# Patient Record
Sex: Male | Born: 1940 | Race: White | Hispanic: No | Marital: Married | Smoking: Former smoker
Health system: Southern US, Community
[De-identification: ages and names within clinical notes are randomized; demographics above are authoritative.]

## PROBLEM LIST (undated history)

## (undated) DIAGNOSIS — R42 Dizziness and giddiness: Secondary | ICD-10-CM

## (undated) DIAGNOSIS — E785 Hyperlipidemia, unspecified: Secondary | ICD-10-CM

## (undated) DIAGNOSIS — N189 Chronic kidney disease, unspecified: Secondary | ICD-10-CM

## (undated) DIAGNOSIS — I2510 Atherosclerotic heart disease of native coronary artery without angina pectoris: Secondary | ICD-10-CM

## (undated) DIAGNOSIS — G629 Polyneuropathy, unspecified: Secondary | ICD-10-CM

## (undated) DIAGNOSIS — H538 Other visual disturbances: Secondary | ICD-10-CM

## (undated) DIAGNOSIS — I739 Peripheral vascular disease, unspecified: Secondary | ICD-10-CM

## (undated) DIAGNOSIS — I4510 Unspecified right bundle-branch block: Secondary | ICD-10-CM

## (undated) DIAGNOSIS — R011 Cardiac murmur, unspecified: Secondary | ICD-10-CM

## (undated) DIAGNOSIS — I70219 Atherosclerosis of native arteries of extremities with intermittent claudication, unspecified extremity: Secondary | ICD-10-CM

## (undated) DIAGNOSIS — E119 Type 2 diabetes mellitus without complications: Secondary | ICD-10-CM

## (undated) DIAGNOSIS — I70213 Atherosclerosis of native arteries of extremities with intermittent claudication, bilateral legs: Secondary | ICD-10-CM

## (undated) DIAGNOSIS — I96 Gangrene, not elsewhere classified: Secondary | ICD-10-CM

## (undated) DIAGNOSIS — Z7902 Long term (current) use of antithrombotics/antiplatelets: Secondary | ICD-10-CM

## (undated) DIAGNOSIS — I5032 Chronic diastolic (congestive) heart failure: Secondary | ICD-10-CM

## (undated) DIAGNOSIS — N183 Chronic kidney disease, stage 3 unspecified: Secondary | ICD-10-CM

## (undated) DIAGNOSIS — I10 Essential (primary) hypertension: Secondary | ICD-10-CM

## (undated) DIAGNOSIS — R079 Chest pain, unspecified: Secondary | ICD-10-CM

## (undated) DIAGNOSIS — Z972 Presence of dental prosthetic device (complete) (partial): Secondary | ICD-10-CM

## (undated) DIAGNOSIS — R002 Palpitations: Secondary | ICD-10-CM

## (undated) HISTORY — DX: Peripheral vascular disease, unspecified: I73.9

## (undated) HISTORY — PX: HERNIA REPAIR: SHX51

## (undated) HISTORY — DX: Chronic kidney disease, unspecified: N18.9

## (undated) HISTORY — PX: OTHER SURGICAL HISTORY: SHX169

## (undated) HISTORY — DX: Other visual disturbances: H53.8

## (undated) HISTORY — DX: Atherosclerotic heart disease of native coronary artery without angina pectoris: I25.10

---

## 2006-04-04 ENCOUNTER — Emergency Department: Payer: Self-pay | Admitting: Emergency Medicine

## 2018-02-17 DIAGNOSIS — E113513 Type 2 diabetes mellitus with proliferative diabetic retinopathy with macular edema, bilateral: Secondary | ICD-10-CM | POA: Diagnosis not present

## 2018-02-18 ENCOUNTER — Telehealth: Payer: Self-pay

## 2018-02-18 NOTE — Telephone Encounter (Signed)
Spoke with Orlinda BlalockMiel about getting this patient in sooner and she spoke with Dr Sherie DonLada and we could not find anything any earlier. We gave her [facility] numbers to [facility] to see if they could get him in any sooner.

## 2018-02-18 NOTE — Telephone Encounter (Signed)
Copied from CRM [phone]. Topic: Appointment Scheduling - Scheduling Inquiry for Clinic >> Feb 17, 2018  5:19 PM Jay SchlichterWeikart, Melissa J wrote: Reason for CRM: Dr Willey BladeBradley King from [facility] called . Pt has severe diabetic retinopathy and his glucose at eye appt today was 262.  Dr Brooke DareKing says this pt needs to be established as soon as possible for care for his diabetes and eyes.  Pt is scheduled for Sept to be established, but Dr is asking for him to be worked in sooner. (within couple of weeks)  Please call pt to let him know if he can get in.. Also please call Dr Brooke DareKing for any further info needed.  Cb is [phone]

## 2018-02-24 DIAGNOSIS — Z125 Encounter for screening for malignant neoplasm of prostate: Secondary | ICD-10-CM | POA: Diagnosis not present

## 2018-02-24 DIAGNOSIS — Z7689 Persons encountering health services in other specified circumstances: Secondary | ICD-10-CM | POA: Diagnosis not present

## 2018-02-24 DIAGNOSIS — Z0000 Encounter for general adult medical examination without abnormal findings: Secondary | ICD-10-CM | POA: Diagnosis not present

## 2018-02-24 DIAGNOSIS — I10 Essential (primary) hypertension: Secondary | ICD-10-CM | POA: Diagnosis not present

## 2018-02-25 DIAGNOSIS — Z125 Encounter for screening for malignant neoplasm of prostate: Secondary | ICD-10-CM | POA: Diagnosis not present

## 2018-02-25 DIAGNOSIS — Z0000 Encounter for general adult medical examination without abnormal findings: Secondary | ICD-10-CM | POA: Diagnosis not present

## 2018-02-25 DIAGNOSIS — E7800 Pure hypercholesterolemia, unspecified: Secondary | ICD-10-CM | POA: Diagnosis not present

## 2018-02-25 DIAGNOSIS — Z7689 Persons encountering health services in other specified circumstances: Secondary | ICD-10-CM | POA: Diagnosis not present

## 2018-02-25 DIAGNOSIS — R7309 Other abnormal glucose: Secondary | ICD-10-CM | POA: Diagnosis not present

## 2018-02-27 DIAGNOSIS — E113513 Type 2 diabetes mellitus with proliferative diabetic retinopathy with macular edema, bilateral: Secondary | ICD-10-CM | POA: Diagnosis not present

## 2018-03-24 ENCOUNTER — Encounter: Payer: Self-pay | Admitting: *Deleted

## 2018-03-24 ENCOUNTER — Inpatient Hospital Stay
Admission: EM | Admit: 2018-03-24 | Discharge: 2018-03-30 | DRG: 271 | Disposition: A | Discharge status: Home Health | Payer: Medicare Other | Attending: Internal Medicine | Admitting: Internal Medicine

## 2018-03-24 ENCOUNTER — Other Ambulatory Visit: Payer: Self-pay

## 2018-03-24 ENCOUNTER — Emergency Department: Payer: Medicare Other

## 2018-03-24 DIAGNOSIS — E11621 Type 2 diabetes mellitus with foot ulcer: Secondary | ICD-10-CM | POA: Diagnosis not present

## 2018-03-24 DIAGNOSIS — Z87891 Personal history of nicotine dependence: Secondary | ICD-10-CM

## 2018-03-24 DIAGNOSIS — M7989 Other specified soft tissue disorders: Secondary | ICD-10-CM | POA: Diagnosis not present

## 2018-03-24 DIAGNOSIS — B9689 Other specified bacterial agents as the cause of diseases classified elsewhere: Secondary | ICD-10-CM | POA: Diagnosis present

## 2018-03-24 DIAGNOSIS — N179 Acute kidney failure, unspecified: Secondary | ICD-10-CM | POA: Diagnosis not present

## 2018-03-24 DIAGNOSIS — Z887 Allergy status to serum and vaccine status: Secondary | ICD-10-CM

## 2018-03-24 DIAGNOSIS — E11628 Type 2 diabetes mellitus with other skin complications: Secondary | ICD-10-CM | POA: Diagnosis present

## 2018-03-24 DIAGNOSIS — R230 Cyanosis: Secondary | ICD-10-CM | POA: Diagnosis not present

## 2018-03-24 DIAGNOSIS — E1140 Type 2 diabetes mellitus with diabetic neuropathy, unspecified: Secondary | ICD-10-CM | POA: Diagnosis not present

## 2018-03-24 DIAGNOSIS — E871 Hypo-osmolality and hyponatremia: Secondary | ICD-10-CM | POA: Diagnosis present

## 2018-03-24 DIAGNOSIS — I70203 Unspecified atherosclerosis of native arteries of extremities, bilateral legs: Secondary | ICD-10-CM | POA: Diagnosis present

## 2018-03-24 DIAGNOSIS — Z833 Family history of diabetes mellitus: Secondary | ICD-10-CM | POA: Diagnosis not present

## 2018-03-24 DIAGNOSIS — Z7984 Long term (current) use of oral hypoglycemic drugs: Secondary | ICD-10-CM | POA: Diagnosis not present

## 2018-03-24 DIAGNOSIS — E1152 Type 2 diabetes mellitus with diabetic peripheral angiopathy with gangrene: Secondary | ICD-10-CM | POA: Diagnosis not present

## 2018-03-24 DIAGNOSIS — L97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 ulcer of other part of right foot with fat layer exposed: Secondary | ICD-10-CM | POA: Diagnosis not present

## 2018-03-24 DIAGNOSIS — L03116 Cellulitis of left lower limb: Secondary | ICD-10-CM | POA: Diagnosis present

## 2018-03-24 DIAGNOSIS — Z23 Encounter for immunization: Secondary | ICD-10-CM

## 2018-03-24 DIAGNOSIS — L02612 Cutaneous abscess of left foot: Secondary | ICD-10-CM | POA: Diagnosis present

## 2018-03-24 DIAGNOSIS — Z79899 Other long term (current) drug therapy: Secondary | ICD-10-CM | POA: Diagnosis not present

## 2018-03-24 DIAGNOSIS — L089 Local infection of the skin and subcutaneous tissue, unspecified: Secondary | ICD-10-CM | POA: Diagnosis present

## 2018-03-24 DIAGNOSIS — M79605 Pain in left leg: Secondary | ICD-10-CM | POA: Diagnosis not present

## 2018-03-24 DIAGNOSIS — E1142 Type 2 diabetes mellitus with diabetic polyneuropathy: Secondary | ICD-10-CM | POA: Diagnosis not present

## 2018-03-24 DIAGNOSIS — I70248 Atherosclerosis of native arteries of left leg with ulceration of other part of lower left leg: Secondary | ICD-10-CM | POA: Diagnosis not present

## 2018-03-24 DIAGNOSIS — L97529 Non-pressure chronic ulcer of other part of left foot with unspecified severity: Secondary | ICD-10-CM | POA: Diagnosis present

## 2018-03-24 DIAGNOSIS — M79672 Pain in left foot: Secondary | ICD-10-CM | POA: Diagnosis not present

## 2018-03-24 DIAGNOSIS — E119 Type 2 diabetes mellitus without complications: Secondary | ICD-10-CM | POA: Diagnosis not present

## 2018-03-24 DIAGNOSIS — N289 Disorder of kidney and ureter, unspecified: Secondary | ICD-10-CM | POA: Diagnosis not present

## 2018-03-24 DIAGNOSIS — I10 Essential (primary) hypertension: Secondary | ICD-10-CM | POA: Diagnosis present

## 2018-03-24 DIAGNOSIS — I739 Peripheral vascular disease, unspecified: Secondary | ICD-10-CM | POA: Diagnosis not present

## 2018-03-24 DIAGNOSIS — I96 Gangrene, not elsewhere classified: Secondary | ICD-10-CM | POA: Diagnosis not present

## 2018-03-24 HISTORY — DX: Type 2 diabetes mellitus without complications: E11.9

## 2018-03-24 LAB — CBC WITH DIFFERENTIAL/PLATELET
BASOS PCT: 1 %
Basophils Absolute: 0.2 10*3/uL — ABNORMAL HIGH (ref 0–0.1)
EOS ABS: 0.2 10*3/uL (ref 0–0.7)
Eosinophils Relative: 1 %
HCT: 36.1 % — ABNORMAL LOW (ref 40.0–52.0)
HEMOGLOBIN: 12.3 g/dL — AB (ref 13.0–18.0)
LYMPHS ABS: 2.3 10*3/uL (ref 1.0–3.6)
Lymphocytes Relative: 14 %
MCH: 30 pg (ref 26.0–34.0)
MCHC: 34.1 g/dL (ref 32.0–36.0)
MCV: 87.9 fL (ref 80.0–100.0)
Monocytes Absolute: 1.3 10*3/uL — ABNORMAL HIGH (ref 0.2–1.0)
Monocytes Relative: 8 %
NEUTROS PCT: 76 %
Neutro Abs: 11.7 10*3/uL — ABNORMAL HIGH (ref 1.4–6.5)
Platelets: 298 10*3/uL (ref 150–440)
RBC: 4.11 MIL/uL — AB (ref 4.40–5.90)
RDW: 13.2 % (ref 11.5–14.5)
WBC: 15.6 10*3/uL — AB (ref 3.8–10.6)

## 2018-03-24 LAB — COMPREHENSIVE METABOLIC PANEL
ALT: 19 U/L (ref 0–44)
AST: 22 U/L (ref 15–41)
Albumin: 3.9 g/dL (ref 3.5–5.0)
Alkaline Phosphatase: 55 U/L (ref 38–126)
Anion gap: 12 (ref 5–15)
BUN: 32 mg/dL — ABNORMAL HIGH (ref 8–23)
CO2: 23 mmol/L (ref 22–32)
Calcium: 9.3 mg/dL (ref 8.9–10.3)
Chloride: 97 mmol/L — ABNORMAL LOW (ref 98–111)
Creatinine, Ser: 1.34 mg/dL — ABNORMAL HIGH (ref 0.61–1.24)
GFR, EST AFRICAN AMERICAN: 57 mL/min — AB (ref >60)
GFR, EST NON AFRICAN AMERICAN: 49 mL/min — AB (ref >60)
Glucose, Bld: 165 mg/dL — ABNORMAL HIGH (ref 70–99)
Potassium: 4.1 mmol/L (ref 3.5–5.1)
Sodium: 132 mmol/L — ABNORMAL LOW (ref 135–145)
Total Bilirubin: 0.6 mg/dL (ref 0.3–1.2)
Total Protein: 8.4 g/dL — ABNORMAL HIGH (ref 6.5–8.1)

## 2018-03-24 LAB — LACTIC ACID, PLASMA: LACTIC ACID, VENOUS: 1.6 mmol/L (ref 0.5–1.9)

## 2018-03-24 LAB — GLUCOSE, CAPILLARY: Glucose-Capillary: 112 mg/dL — ABNORMAL HIGH (ref 70–99)

## 2018-03-24 LAB — SEDIMENTATION RATE: Sed Rate: 95 mm/hr — ABNORMAL HIGH (ref 0–20)

## 2018-03-24 MED ORDER — PNEUMOCOCCAL VAC POLYVALENT 25 MCG/0.5ML IJ INJ
0.5000 mL | INJECTION | INTRAMUSCULAR | Status: DC
  Filled 2018-03-24: qty 0.5

## 2018-03-24 MED ORDER — HYDRALAZINE HCL 25 MG PO TABS
25.0000 mg | ORAL_TABLET | Freq: Three times a day (TID) | ORAL | Status: DC
  Administered 2018-03-24 – 2018-03-30 (×15): 25 mg via ORAL
  Filled 2018-03-24 (×15): qty 1

## 2018-03-24 MED ORDER — SODIUM CHLORIDE 0.9 % IV BOLUS
1000.0000 mL | Freq: Once | INTRAVENOUS | Status: AC
  Administered 2018-03-24: 1000 mL via INTRAVENOUS

## 2018-03-24 MED ORDER — PIPERACILLIN-TAZOBACTAM 3.375 G IVPB
3.3750 g | Freq: Three times a day (TID) | INTRAVENOUS | Status: DC
  Administered 2018-03-24 – 2018-03-28 (×10): 3.375 g via INTRAVENOUS
  Filled 2018-03-24 (×10): qty 50

## 2018-03-24 MED ORDER — INSULIN ASPART 100 UNIT/ML ~~LOC~~ SOLN: 0.0000 [IU] | Freq: Every day | SUBCUTANEOUS | Status: DC

## 2018-03-24 MED ORDER — INSULIN ASPART 100 UNIT/ML ~~LOC~~ SOLN
3.0000 [IU] | Freq: Three times a day (TID) | SUBCUTANEOUS | Status: DC
  Administered 2018-03-25 – 2018-03-30 (×8): 3 [IU] via SUBCUTANEOUS
  Filled 2018-03-24 (×8): qty 1

## 2018-03-24 MED ORDER — AMLODIPINE BESYLATE 5 MG PO TABS
2.5000 mg | ORAL_TABLET | Freq: Every day | ORAL | Status: DC
  Administered 2018-03-25 – 2018-03-30 (×6): 2.5 mg via ORAL
  Filled 2018-03-24 (×6): qty 1

## 2018-03-24 MED ORDER — INSULIN ASPART 100 UNIT/ML ~~LOC~~ SOLN
0.0000 [IU] | Freq: Three times a day (TID) | SUBCUTANEOUS | Status: DC
  Administered 2018-03-25: 1 [IU] via SUBCUTANEOUS
  Administered 2018-03-25: 2 [IU] via SUBCUTANEOUS
  Administered 2018-03-27: 1 [IU] via SUBCUTANEOUS
  Administered 2018-03-27: 3 [IU] via SUBCUTANEOUS
  Administered 2018-03-28 – 2018-03-30 (×4): 2 [IU] via SUBCUTANEOUS
  Filled 2018-03-24 (×8): qty 1

## 2018-03-24 MED ORDER — ACETAMINOPHEN 325 MG PO TABS
650.0000 mg | ORAL_TABLET | Freq: Four times a day (QID) | ORAL | Status: DC | PRN
  Administered 2018-03-24 – 2018-03-30 (×7): 650 mg via ORAL
  Filled 2018-03-24 (×7): qty 2

## 2018-03-24 MED ORDER — TRAMADOL HCL 50 MG PO TABS
50.0000 mg | ORAL_TABLET | Freq: Three times a day (TID) | ORAL | Status: DC | PRN
  Administered 2018-03-25: 50 mg via ORAL
  Filled 2018-03-24: qty 1

## 2018-03-24 MED ORDER — VANCOMYCIN HCL IN DEXTROSE 1-5 GM/200ML-% IV SOLN: 1000.0000 mg | Freq: Once | INTRAVENOUS | Status: DC

## 2018-03-24 MED ORDER — HEPARIN SODIUM (PORCINE) 5000 UNIT/ML IJ SOLN
5000.0000 [IU] | Freq: Three times a day (TID) | INTRAMUSCULAR | Status: DC
  Administered 2018-03-24 – 2018-03-30 (×14): 5000 [IU] via SUBCUTANEOUS
  Filled 2018-03-24 (×14): qty 1

## 2018-03-24 MED ORDER — PIPERACILLIN-TAZOBACTAM 3.375 G IVPB 30 MIN: 3.3750 g | Freq: Once | INTRAVENOUS | Status: DC

## 2018-03-24 MED ORDER — VANCOMYCIN HCL 10 G IV SOLR
1750.0000 mg | Freq: Once | INTRAVENOUS | Status: AC
  Administered 2018-03-24: 1750 mg via INTRAVENOUS
  Filled 2018-03-24: qty 1750

## 2018-03-24 MED ORDER — VANCOMYCIN HCL 10 G IV SOLR
1250.0000 mg | INTRAVENOUS | Status: DC
  Administered 2018-03-25 – 2018-03-27 (×3): 1250 mg via INTRAVENOUS
  Filled 2018-03-24 (×3): qty 1250

## 2018-03-24 NOTE — H&P (Signed)
[facility] [place] [facility]   PATIENT NAME: [name]    MR#:  [id number]  DATE OF BIRTH:  Aug 03, 1941  DATE OF ADMISSION:  03/24/2018  PRIMARY CARE PHYSICIAN: Wilford Corner, PA-C   REQUESTING/REFERRING PHYSICIAN: Rockne Menghini, MD  CHIEF COMPLAINT:   Chief Complaint  Patient presents with  . Foot Pain    HISTORY OF PRESENT ILLNESS:  [name]  is a 77 y.o. male who was recently diagnosed with diabetes in June 2019, presenting with left foot erythema, drainage, warmth and pain. The patient reports that for the past two weeks, he has had pain around the second toe with surrounding progressively worsening erythema to the midfoot with associated warmth.  His second toe is now discolored purple.  He has also noted some purulent discharge between the second and third toe.  He does not remember an injury.  He has been taking his medications as prescribed.  He has had subjective fevers at home with decreased appetite.  PAST MEDICAL HISTORY:   Past Medical History:  Diagnosis Date  . Diabetes mellitus without complication (HCC)     PAST SURGICAL HISTORY:   Past Surgical History:  Procedure Laterality Date  . HERNIA REPAIR      SOCIAL HISTORY:   Social History   Tobacco Use  . Smoking status: Former Smoker    Types: Cigarettes  . Smokeless tobacco: Never Used  Substance Use Topics  . Alcohol use: Never    Frequency: Never    FAMILY HISTORY:  History reviewed. No pertinent family history. Mother with diabetes DRUG ALLERGIES:   Allergies  Allergen Reactions  . Flu Virus Vaccine Other (See Comments) and Nausea And Vomiting    Fever, chills, vomiting.     REVIEW OF SYSTEMS:   Review of Systems  Constitutional: Negative for chills, fever and weight loss.  HENT: Negative for nosebleeds and sore throat.   Eyes: Negative for blurred vision.  Respiratory: Negative for cough, shortness of breath and wheezing.     Cardiovascular: Negative for chest pain, orthopnea, leg swelling and PND.  Gastrointestinal: Negative for abdominal pain, constipation, diarrhea, heartburn, nausea and vomiting.  Genitourinary: Negative for dysuria and urgency.  Musculoskeletal: Positive for joint pain. Negative for back pain.  Skin: Positive for rash.  Neurological: Negative for dizziness, speech change, focal weakness and headaches.  Endo/Heme/Allergies: Does not bruise/bleed easily.  Psychiatric/Behavioral: Negative for depression.    MEDICATIONS AT HOME:   Prior to Admission medications   Medication Sig Start Date End Date Taking? Authorizing Provider  amLODipine (NORVASC) 2.5 MG tablet Take 2.5 mg by mouth daily. 03/14/18  Yes [provider]  lisinopril (PRINIVIL,ZESTRIL) 5 MG tablet Take 5 mg by mouth daily. 02/25/18  Yes [provider]  metFORMIN (GLUCOPHAGE) 500 MG tablet Take 1,000 mg by mouth 2 (two) times daily. 02/25/18  Yes [provider]      VITAL SIGNS:  Blood pressure (!) 170/71, pulse 92, temperature 99.7 F (37.6 C), resp. rate 18, height 5\' 9"  (1.753 m), weight 79.4 kg (175 lb), SpO2 99 %.  PHYSICAL EXAMINATION:  Physical Exam  GENERAL:  77 y.o.-year-old patient lying in the bed with no acute distress.  EYES: Pupils equal, round, reactive to light and accommodation. No scleral icterus. Extraocular muscles intact.  HEENT: Head atraumatic, normocephalic. Oropharynx and nasopharynx clear.  NECK:  Supple, no jugular venous distention. No thyroid enlargement, no tenderness.  LUNGS: Normal breath sounds bilaterally, no wheezing, rales,rhonchi or crepitation.  No use of accessory muscles of respiration.  CARDIOVASCULAR: S1, S2 normal. No murmurs, rubs, or gallops.  ABDOMEN: Soft, nontender, nondistended. Bowel sounds present. No organomegaly or mass.  EXTREMITIES: No pedal edema, cyanosis, or clubbing.  Left foot exam as below NEUROLOGIC: Cranial nerves II through XII are  intact. Muscle strength 5/5 in all extremities. Sensation intact. Gait not checked.  PSYCHIATRIC: The patient is alert and oriented x 3.  SKIN: patient's left foot has purple discoloration with swelling and purulent discharge around the second toe without any obvious open skin or fluctuance.  He has surrounding erythema in all of the toes to the proximal foot with swelling and warmth.   LABORATORY PANEL:   CBC Recent Labs  Lab 03/24/18 1651  WBC 15.6*  HGB 12.3*  HCT 36.1*  PLT 298   ------------------------------------------------------------------------------------------------------------------  Chemistries  Recent Labs  Lab 03/24/18 1651  NA 132*  K 4.1  CL 97*  CO2 23  GLUCOSE 165*  BUN 32*  CREATININE 1.34*  CALCIUM 9.3  AST 22  ALT 19  ALKPHOS 55  BILITOT 0.6   ------------------------------------------------------------------------------------------------------------------  Cardiac Enzymes No results for input(s): TROPONINI in the last 168 hours. ------------------------------------------------------------------------------------------------------------------  RADIOLOGY:  Dg Foot Complete Left  Result Date: 03/24/2018 CLINICAL DATA:  Suspected gangrene in a diabetic patient. Foot swelling. EXAM: LEFT FOOT - COMPLETE 3+ VIEW COMPARISON:  None. FINDINGS: There is no evidence of fracture or dislocation. There is no evidence of arthropathy or other focal bone abnormality. Mild diffuse soft tissue swelling. No air is seen in the soft tissues. There are plantar and Achilles spurs. IMPRESSION: No plain film findings of osteomyelitis, nor air in the soft tissues. Electronically Signed   By: Elsie StainJohn T Curnes M.D.   On: 03/24/2018 17:17   IMPRESSION AND PLAN:  77 year old male with diabetic foot infection  *Diabetic foot infection -Start IV vancomycin and Zosyn for now -Consult podiatry -X-ray does not show any osteomyelitis  *Diabetes mellitus -Start sliding scale  insulin, check hemoglobin A1c. hold metformin -Consult diabetic nurse  *Acute renal failure - ATN versus prerenal -Hold metformin and lisinopril -Hydrate with IV fluids and monitor renal function  *Hypertension Continue Norvasc, add hydralazine for better blood pressure control    All the records are reviewed and case discussed with ED provider. Management plans discussed with the patient, family (wife at bedside) and they are in agreement.  CODE STATUS: FULL CODE  TOTAL TIME TAKING CARE OF THIS PATIENT: 45 minutes.    Delfino LovettVipul Ka Flammer M.D on 03/24/2018 at 8:37 PM  Between 7am to 6pm - Pager - [phone]  After 6pm go to [email or web address] - Scientist, research (life sciences)password EPAS [facility]  [facility]  Office  [phone]  CC: Primary care physician; Whitaker, Jonnie FinnerJason Hestle, PA-C   Note: This dictation was prepared with Dragon dictation along with smaller phrase technology. Any transcriptional errors that result from this process are unintentional.

## 2018-03-24 NOTE — ED Notes (Signed)
Pt given urinal for urine specimen collection 

## 2018-03-24 NOTE — ED Notes (Signed)
First set of blood cultures drawn in triage.

## 2018-03-24 NOTE — ED Provider Notes (Signed)
[facility] Emergency Department Provider Note  ____________________________________________  Time seen: Approximately 7:55 PM  I have reviewed the triage vital signs and the nursing notes.   HISTORY  Chief Complaint Foot Pain    HPI [name] is a 77 y.o. male, recently diagnosed with diabetes, presenting with left foot erythema, drainage, warmth and pain. The patient reports that for the past two weeks, he has had pain around the second toe with surrounding progressively worsening erythema to the midfoot with associated warmth.  His second toe is now discolored purple.  He has also noted some purulent discharge between the second and third toe.  He does not remember an injury.  He has been taking his medications as prescribed.  He has had subjective fevers at home with decreased appetite.  No nausea vomiting or diarrhea.   Past Medical History:  Diagnosis Date  . Diabetes mellitus without complication (HCC)     There are no active problems to display for this patient.   Past Surgical History:  Procedure Laterality Date  . HERNIA REPAIR      Current Outpatient Rx  . Order #: [id number] Class: Historical Med  . Order #: [id number] Class: Historical Med  . Order #: [id number] Class: Historical Med    Allergies Flu virus vaccine  History reviewed. No pertinent family history.  Social History Social History   Tobacco Use  . Smoking status: Former Smoker    Types: Cigarettes  . Smokeless tobacco: Never Used  Substance Use Topics  . Alcohol use: Never    Frequency: Never  . Drug use: Never    Review of Systems Constitutional: No fever/chills.  No lightheadedness or syncope. Eyes: No visual changes. ENT:No congestion or rhinorrhea. Cardiovascular: Denies chest pain. Denies palpitations. Respiratory: Denies shortness of breath.  No cough. Gastrointestinal: No abdominal pain.  No nausea, no vomiting.  No diarrhea.  No  constipation. Genitourinary: Negative for dysuria. Musculoskeletal: Negative for back pain.  Positive for left foot erythema, swelling and warmth with pain and purulent discharge.. Skin: Negative for rash. Neurological: Negative for headaches. No focal numbness, tingling or weakness.  Endocrine:Newly diagnosed diabetic.   ____________________________________________   PHYSICAL EXAM:  VITAL SIGNS: ED Triage Vitals  Enc Vitals Group     BP 03/24/18 1554 (!) 152/74     Pulse Rate 03/24/18 1554 89     Resp 03/24/18 1554 20     Temp 03/24/18 1554 99.9 F (37.7 C)     Temp Source 03/24/18 1554 Oral     SpO2 03/24/18 1554 97 %     Weight 03/24/18 1631 175 lb (79.4 kg)     Height 03/24/18 1631 5\' 9"  (1.753 m)     Head Circumference --      Peak Flow --      Pain Score 03/24/18 1631 4     Pain Loc --      Pain Edu? --      Excl. in GC? --     Constitutional: Alert and oriented. Answers questions appropriately.  Uncomfortable appearing but nontoxic. Eyes: Conjunctivae are normal.  EOMI. No scleral icterus. Head: Atraumatic. Nose: No congestion/rhinnorhea. Mouth/Throat: Mucous membranes are moist.  Neck: No stridor.  Supple.  No JVD.  No meningismus. Cardiovascular: Normal rate, regular rhythm. No murmurs, rubs or gallops.  Respiratory: Normal respiratory effort.  No accessory muscle use or retractions. Lungs CTAB.  No wheezes, rales or ronchi. Gastrointestinal: Soft, nontender and nondistended.  No guarding or rebound.  No peritoneal  signs. Musculoskeletal: The patient's left foot has purple discoloration with swelling and purulent discharge around the second toe without any obvious open skin or fluctuance.  He has surrounding erythema in all of the toes to the proximal foot with swelling and warmth.  He has normal DP and PT pulses bilaterally. Neurologic:  A&Ox3.  Speech is clear.  Face and smile are symmetric.  EOMI.  Moves all extremities well. Skin:  Skin is warm, dry and  intact. No rash noted. Psychiatric: Mood and affect are normal. Speech and behavior are normal.  Normal judgement.  ____________________________________________   LABS (all labs ordered are listed, but only abnormal results are displayed)  Labs Reviewed  COMPREHENSIVE METABOLIC PANEL - Abnormal; Notable for the following components:      Result Value   Sodium 132 (*)    Chloride 97 (*)    Glucose, Bld 165 (*)    BUN 32 (*)    Creatinine, Ser 1.34 (*)    Total Protein 8.4 (*)    GFR calc non Af Amer 49 (*)    GFR calc Af Amer 57 (*)    All other components within normal limits  CBC WITH DIFFERENTIAL/PLATELET - Abnormal; Notable for the following components:   WBC 15.6 (*)    RBC 4.11 (*)    Hemoglobin 12.3 (*)    HCT 36.1 (*)    Neutro Abs 11.7 (*)    Monocytes Absolute 1.3 (*)    Basophils Absolute 0.2 (*)    All other components within normal limits  LACTIC ACID, PLASMA  SEDIMENTATION RATE   ____________________________________________  EKG  Not indicated ____________________________________________  RADIOLOGY  Dg Foot Complete Left  Result Date: 03/24/2018 CLINICAL DATA:  Suspected gangrene in a diabetic patient. Foot swelling. EXAM: LEFT FOOT - COMPLETE 3+ VIEW COMPARISON:  None. FINDINGS: There is no evidence of fracture or dislocation. There is no evidence of arthropathy or other focal bone abnormality. Mild diffuse soft tissue swelling. No air is seen in the soft tissues. There are plantar and Achilles spurs. IMPRESSION: No plain film findings of osteomyelitis, nor air in the soft tissues. Electronically Signed   By: Elsie StainJohn T Curnes M.D.   On: 03/24/2018 17:17    ____________________________________________   PROCEDURES  Procedure(s) performed: None  Procedures  Critical Care performed: No ____________________________________________   INITIAL IMPRESSION / ASSESSMENT AND PLAN / ED COURSE  Pertinent labs & imaging results that were available during my  care of the patient were reviewed by me and considered in my medical decision making (see chart for details).  77 y.o. male w/ DM presenting with left foot erythema, warmth, swelling, purplish discoloration of the second toe with purulent discharge.  The patient has been having subjective fevers at home although he is afebrile here.  Overall, the patient's symptoms are most consistent with a diabetic foot infection and I have ordered vancomycin for the patient.  The patient has been having fevers and decreased appetite, so other worsening signs of infection are possible including osteomyelitis, bacteremia.  Plan admission.  ----------------------------------------- 8:10 PM on 03/24/2018 -----------------------------------------  The patient's left foot x-ray does not show any evidence of gas.  In addition, his lactic acid is reassuring.  ____________________________________________  FINAL CLINICAL IMPRESSION(S) / ED DIAGNOSES  Final diagnoses:  Diabetic foot infection (HCC)  Hyponatremia  Acute renal insufficiency         NEW MEDICATIONS STARTED DURING THIS VISIT:  New Prescriptions   No medications on file  Rockne Menghini, MD 03/24/18 2010

## 2018-03-24 NOTE — ED Notes (Signed)
Pt able to move all toes. Pt has redness and warmth noted to left foot. Pt states he recently found out he is diabetic. Pt's 2nd toe is purple discoloration and has what looks like infection surrounding the lower toe. Pt's left foot also looks to have some possible blistering coming.

## 2018-03-24 NOTE — ED Triage Notes (Signed)
Pt to ED after PCP sent him for R/O gang green in his left foot. Pt has redness noted to the toes and half of his left foot along with greenish grey discharge from a blistered area around his second toe and the underside of his great toe. Pt is able to move his toes with reports of decreased sensation but has decreased sensation on baseline. Intermittent fevers at home.

## 2018-03-24 NOTE — Consult Note (Signed)
Pharmacy Antibiotic Note  Charles BrochureHoward D [name] is a 77 y.o. male admitted on 03/24/2018 with Diabetic Foot Infection .  Pharmacy has been consulted for Vancomycin and Zosyn  Dosing. Patient received vancomycin 1750mg  IV x 1 dose in ED.    Plan: Ke: 0.043   T1/2: 16.2    Vd: 55.58  Start Vancomycin 1250 IV every 24 hours with 15 hour stack dosing.  Goal trough 15-20 mcg/mL. Calculated trough at Css is 15.6. Trough level prior to 4th dose.   Start Zosyn 3.375 IV EI every 8 hours.   Height: 5\' 9"  (175.3 cm) Weight: 175 lb (79.4 kg) IBW/kg (Calculated) : 70.7  Temp (24hrs), Avg:99.8 F (37.7 C), Min:99.7 F (37.6 C), Max:99.9 F (37.7 C)  Recent Labs  Lab 03/24/18 1651  WBC 15.6*  CREATININE 1.34*  LATICACIDVEN 1.6    Estimated Creatinine Clearance: 46.2 mL/min (A) (by C-G formula based on SCr of 1.34 mg/dL (H)).    Allergies  Allergen Reactions  . Flu Virus Vaccine Other (See Comments) and Nausea And Vomiting    Fever, chills, vomiting.     Antimicrobials this admission: 7/8 Zosyn  >>  7/8 vancomycin >>   Dose adjustments this admission:  Microbiology results: 7/8 BCx: pending  Thank you for allowing pharmacy to be a part of this patient's care.  Gardner CandleSheema M Norine Reddington, PharmD, BCPS Clinical Pharmacist 03/24/2018 8:56 PM

## 2018-03-24 NOTE — ED Notes (Signed)
First Nurse Note:  Patient presents to the ED from PCP.  Per PCP patient's 2nd toe on his left foot appears to be "gangrenous".  Patient has severe peripheral neuropathy.  Patient has redness to left leg and a low grade fever.

## 2018-03-25 DIAGNOSIS — M79605 Pain in left leg: Secondary | ICD-10-CM

## 2018-03-25 DIAGNOSIS — I10 Essential (primary) hypertension: Secondary | ICD-10-CM

## 2018-03-25 DIAGNOSIS — E11621 Type 2 diabetes mellitus with foot ulcer: Secondary | ICD-10-CM

## 2018-03-25 LAB — GLUCOSE, CAPILLARY
GLUCOSE-CAPILLARY: 103 mg/dL — AB (ref 70–99)
GLUCOSE-CAPILLARY: 172 mg/dL — AB (ref 70–99)
GLUCOSE-CAPILLARY: 143 mg/dL — AB (ref 70–99)
GLUCOSE-CAPILLARY: 103 mg/dL — AB (ref 70–99)

## 2018-03-25 LAB — HEMOGLOBIN A1C
Hgb A1c MFr Bld: 7.2 % — ABNORMAL HIGH (ref 4.8–5.6)
Mean Plasma Glucose: 159.94 mg/dL

## 2018-03-25 MED ORDER — TETANUS-DIPHTH-ACELL PERTUSSIS 5-2.5-18.5 LF-MCG/0.5 IM SUSP
0.5000 mL | Freq: Once | INTRAMUSCULAR | Status: AC
Start: 2018-03-25 — End: 2018-03-25
  Administered 2018-03-25: 0.5 mL via INTRAMUSCULAR
  Filled 2018-03-25: qty 0.5

## 2018-03-25 NOTE — Progress Notes (Signed)
Inpatient Diabetes Program Recommendations  AACE/ADA: New Consensus Statement on Inpatient Glycemic Control (2015)  Target Ranges:  Prepandial:   less than 140 mg/dL      Peak postprandial:   less than 180 mg/dL (1-2 hours)      Critically ill patients:  140 - 180 mg/dL   Lab Results  Component Value Date   GLUCAP 172 (H) 03/25/2018   HGBA1C 7.2 (H) 03/24/2018    Review of Glycemic ControlResults for Charles BrochureMICHAEL, [name] (MRN [id number]) as of 03/25/2018 14:03  Ref. Range 03/24/2018 21:55 03/25/2018 07:47 03/25/2018 11:48  Glucose-Capillary Latest Ref Range: 70 - 99 mg/dL 735112 (H) 329103 (H) 924172 (H)    Diabetes history: Type 2 Outpatient Diabetes medications: Metformin 1000 mg bid Current orders for Inpatient glycemic control:  Novolog sensitive tid with meals and HS, Novolog 3 units tid with meals Inpatient Diabetes Program Recommendations:   Agree with current orders.  A1C currently within goal.   Thanks,  Beryl MeagerJenny Hikaru Delorenzo, RN, BC-ADM Inpatient Diabetes Coordinator Pager [phone] (8a-5p)

## 2018-03-25 NOTE — Consult Note (Signed)
[facility] Vascular Consult Note  MRN : [id number]  [name] is a 77 y.o. (24-Apr-1941) male who presents with chief complaint of  Chief Complaint  Patient presents with  . Foot Pain  .  History of Present Illness:   I have asked to evaluate the patient by Dr. Alberteen Spindleline for peripheral artery disease in association with left foot ulceration, diabetic foot infection.  Patient is a 77 year old gentleman who was recently informed that he is a diabetic as well as hypertensive person.  He readily states that he has not been seeking any medical attention for decades.  He presented to [facility] yesterday with left foot pain drainage and increasing redness.  Patient notes the symptoms began approximately 2 weeks ago.  They have been steadily worsening with time.  He states that his foot is normally quite numb and he does not feel anything but since the onset of the symptoms he has now experiencing intense pain area he also has been noting drainage from the second and third toe.  He denies any specific trauma.  He states he has been feeling feverish at home but has not taken his temperature.  He denies chills or Reiger's.  He denies nausea vomiting.  He does state that he has not been hungry and has decreased appetite.  Patient states this is the first time he has had any infections of the left foot.  He does relate that in the remote past he stepped on a nail and had to have treatment of his right foot.  He denies claudication.  He denies prior vascular evaluation, angiography/intervention or other invasive vascular surgeries.  Current Facility-Administered Medications  Medication Dose Route Frequency Provider Last Rate Last Dose  . acetaminophen (TYLENOL) tablet 650 mg  650 mg Oral Q6H PRN Charles CopaMaier, Angela, MD   650 mg at 03/24/18 2220  . amLODipine (NORVASC) tablet 2.5 mg  2.5 mg Oral Daily Sherryll BurgerShah, Vipul, MD   2.5 mg at 03/25/18 1035  . heparin  injection 5,000 Units  5,000 Units Subcutaneous Q8H Delfino LovettShah, Vipul, MD   5,000 Units at 03/25/18 2024  . hydrALAZINE (APRESOLINE) tablet 25 mg  25 mg Oral Q8H Delfino LovettShah, Vipul, MD   25 mg at 03/25/18 2024  . insulin aspart (novoLOG) injection 0-5 Units  0-5 Units Subcutaneous QHS Sherryll BurgerShah, Vipul, MD      . insulin aspart (novoLOG) injection 0-9 Units  0-9 Units Subcutaneous TID WC Delfino LovettShah, Vipul, MD   1 Units at 03/25/18 1652  . insulin aspart (novoLOG) injection 3 Units  3 Units Subcutaneous TID WC Delfino LovettShah, Vipul, MD   3 Units at 03/25/18 1653  . piperacillin-tazobactam (ZOSYN) IVPB 3.375 g  3.375 g Intravenous Q8H Hallaji, Sheema M, RPH 12.5 mL/hr at 03/25/18 2024 3.375 g at 03/25/18 2024  . pneumococcal 23 valent vaccine (PNU-IMMUNE) injection 0.5 mL  0.5 mL Intramuscular Tomorrow-1000 Sherryll BurgerShah, Vipul, MD      . traMADol Janean Sark(ULTRAM) tablet 50 mg  50 mg Oral TID PRN Charles CopaMaier, Angela, MD   50 mg at 03/25/18 1621  . vancomycin (VANCOCIN) 1,250 mg in sodium chloride 0.9 % 250 mL IVPB  1,250 mg Intravenous Q24H Gardner CandleHallaji, Sheema M, RPH   Stopped at 03/25/18 1331    Past Medical History:  Diagnosis Date  . Diabetes mellitus without complication [facility](HCC)     Past Surgical History:  Procedure Laterality Date  . HERNIA REPAIR      Social History Social History   Tobacco  Use  . Smoking status: Former Smoker    Types: Cigarettes  . Smokeless tobacco: Never Used  Substance Use Topics  . Alcohol use: Never    Frequency: Never  . Drug use: Never    Family History History reviewed. No pertinent family history. No family history of bleeding/clotting disorders, porphyria or autoimmune disease   Allergies  Allergen Reactions  . Flu Virus Vaccine Other (See Comments) and Nausea And Vomiting    Fever, chills, vomiting.      REVIEW OF SYSTEMS (Negative unless checked)  Constitutional: [] Weight loss  [] Fever  [] Chills Cardiac: [] Chest pain   [] Chest pressure   [] Palpitations   [] Shortness of breath when laying flat    [] Shortness of breath at rest   [] Shortness of breath with exertion. Vascular:  [] Pain in legs with walking   [] Pain in legs at rest   [] Pain in legs when laying flat   [] Claudication   [] Pain in feet when walking  [x] Pain in feet at rest  [x] Pain in feet when laying flat   [] History of DVT   [] Phlebitis   [x] Swelling in legs   [] Varicose veins   [] Non-healing ulcers Pulmonary:   [] Uses home oxygen   [] Productive cough   [] Hemoptysis   [] Wheeze  [] COPD   [] Asthma Neurologic:  [] Dizziness  [] Blackouts   [] Seizures   [] History of stroke   [] History of TIA  [] Aphasia   [] Temporary blindness   [] Dysphagia   [] Weakness or numbness in arms   [] Weakness or numbness in legs Musculoskeletal:  [] Arthritis   [x] Joint swelling   [] Joint pain   [] Low back pain Hematologic:  [] Easy bruising  [] Easy bleeding   [] Hypercoagulable state   [] Anemic  [] Hepatitis Gastrointestinal:  [] Blood in stool   [] Vomiting blood  [] Gastroesophageal reflux/heartburn   [] Difficulty swallowing. Genitourinary:  [] Chronic kidney disease   [] Difficult urination  [] Frequent urination  [] Burning with urination   [] Blood in urine Skin:  [] Rashes   [x] Ulcers   [x] Wounds Psychological:  [] History of anxiety   []  History of major depression.  Physical Examination  Vitals:   03/25/18 1035 03/25/18 1426 03/25/18 1625 03/25/18 1630  BP: (!) 144/62 (!) 178/75 (!) 175/71 (!) 177/71  Pulse:   76 75  Resp:   17   Temp:   98.3 F (36.8 C) 99.4 F (37.4 C)  TempSrc:   Oral Oral  SpO2:   97% 98%  Weight:      Height:       Body mass index is 25.84 kg/m. Gen:  WD/WN, NAD Head: [place]/AT, No temporalis wasting. Prominent temp pulse not noted. Ear/Nose/Throat: Hearing grossly intact, nares w/o erythema or drainage, oropharynx w/o Erythema/Exudate Eyes: Sclera non-icteric, conjunctiva clear Neck: Trachea midline.  No JVD.  Pulmonary:  Good air movement, respirations not labored, equal bilaterally.  Cardiac: RRR, normal S1, S2. Vascular:  Erythema of the forefoot great toe is cool to the touch with 1-2 second capillary refill but it is pink at this point Vessel Right Left  Radial Palpable Palpable  Popliteal  enlarged palpable  enlarged palpable  PT  not palpable  not palpable  DP  not palpable  not palpable  Gastrointestinal: soft, non-tender/non-distended. No guarding/reflex.  Musculoskeletal: M/S 5/5 throughout.  Extremities without ischemic changes.  No deformity or atrophy. No edema. Neurologic: Sensation grossly intact in extremities.  Symmetrical.  Speech is fluent. Motor exam as listed above. Psychiatric: Judgment intact, Mood & affect appropriate for pt's clinical situation. Dermatologic: No rashes positive left foot  ulcer base of the second and third toe noted.  Positive cellulitis positive open wounds. Lymph : No Cervical, Axillary, or Inguinal lymphadenopathy.      CBC Lab Results  Component Value Date   WBC 15.6 (H) 03/24/2018   HGB 12.3 (L) 03/24/2018   HCT 36.1 (L) 03/24/2018   MCV 87.9 03/24/2018   PLT 298 03/24/2018    BMET    Component Value Date/Time   NA 132 (L) 03/24/2018 1651   K 4.1 03/24/2018 1651   CL 97 (L) 03/24/2018 1651   CO2 23 03/24/2018 1651   GLUCOSE 165 (H) 03/24/2018 1651   BUN 32 (H) 03/24/2018 1651   CREATININE 1.34 (H) 03/24/2018 1651   CALCIUM 9.3 03/24/2018 1651   GFRNONAA 49 (L) 03/24/2018 1651   GFRAA 57 (L) 03/24/2018 1651   Estimated Creatinine Clearance: 46.2 mL/min (A) (by C-G formula based on SCr of 1.34 mg/dL (H)).  COAG No results found for: INR, PROTIME  Radiology Dg Foot Complete Left  Result Date: 03/24/2018 CLINICAL DATA:  Suspected gangrene in a diabetic patient. Foot swelling. EXAM: LEFT FOOT - COMPLETE 3+ VIEW COMPARISON:  None. FINDINGS: There is no evidence of fracture or dislocation. There is no evidence of arthropathy or other focal bone abnormality. Mild diffuse soft tissue swelling. No air is seen in the soft tissues. There are plantar and  Achilles spurs. IMPRESSION: No plain film findings of osteomyelitis, nor air in the soft tissues. Electronically Signed   By: Elsie Stain M.D.   On: 03/24/2018 17:17      Assessment/Plan 1.  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  This represents a left limb threatening ischemia and places the patient at the risk for limb loss.  Patient should undergo angiography of the left lower extremities with the hope for intervention for limb salvage.  The risks and benefits as well as the alternative therapies was discussed in detail with the patient.  All questions were answered.  Patient agrees to proceed with angiography.  The patient will follow up with me in the office after the procedure.   2.  Diabetic foot infection: Patient is on appropriate antibiotics.  He has been evaluated by Dr. Alberteen Spindle.  At this time he will undergo revascularization after which further assessment as to whether he needs surgical drainage and debridement.  3.  Diabetes mellitus: Continue hypoglycemic medications as already ordered, these medications have been reviewed and there are no changes at this time.  Hgb A1C to be monitored as already arranged by primary service  4.  Hypertension: Continue antihypertensive medications as already ordered, these medications have been reviewed and there are no changes at this time.     [name], MD  03/25/2018 9:45 PM    This note was created with Dragon medical transcription system.  Any error is purely unintentional

## 2018-03-25 NOTE — Progress Notes (Signed)
Pt c/o pain on the left foot, 4/10 scale, low grade fever, T=100.8, BP=196/71, no PRN medicines ordered per MAR. Dr. Caryn BeeMaier paged of the above, ordered to give PRN Tylenol 650mg  for fever and mild pain, Tramadol 50mg  PO for moderate pain and pt was started on Hydralazine 25mg  PO scheduled. Will administer and continue to monitor.

## 2018-03-25 NOTE — Progress Notes (Signed)
[facility] [place] [facility]   PATIENT NAME: Peter CongoHoward Einstein    MR#:  [id number]  DATE OF BIRTH:  [birth year]  SUBJECTIVE:  came in with redness and infection of the left foot. Denies much pain. No fever.  REVIEW OF SYSTEMS:   ROS Tolerating Diet: Tolerating PT:   DRUG ALLERGIES:   Allergies  Allergen Reactions  . Flu Virus Vaccine Other (See Comments) and Nausea And Vomiting    Fever, chills, vomiting.     VITALS:  Blood pressure (!) 144/62, pulse 70, temperature 98.6 F (37 C), temperature source Oral, resp. rate 18, height 5\' 9"  (1.753 m), weight 79.4 kg (175 lb), SpO2 99 %.  PHYSICAL EXAMINATION:   Physical Exam  GENERAL:  77 y.o.-year-old patient lying in the bed with no acute distress.  EYES: Pupils equal, round, reactive to light and accommodation. No scleral icterus. Extraocular muscles intact.  HEENT: Head atraumatic, normocephalic. Oropharynx and nasopharynx clear.  NECK:  Supple, no jugular venous distention. No thyroid enlargement, no tenderness.  LUNGS: Normal breath sounds bilaterally, no wheezing, rales, rhonchi. No use of accessory muscles of respiration.  CARDIOVASCULAR: S1, S2 normal. No murmurs, rubs, or gallops.  ABDOMEN: Soft, nontender, nondistended. Bowel sounds present. No organomegaly or mass.  EXTREMITIES: No cyanosis, clubbing or edema b/l.   Left foot cellulitis with edema left foot along with white cyanotic changes consistent most likely with abscess/pulse NEUROLOGIC: Cranial nerves II through XII are intact. No focal Motor or sensory deficits b/l.   PSYCHIATRIC:  patient is alert and oriented x 3.  SKIN: No obvious rash, lesion, or ulcer.   LABORATORY PANEL:  CBC Recent Labs  Lab 03/24/18 1651  WBC 15.6*  HGB 12.3*  HCT 36.1*  PLT 298    Chemistries  Recent Labs  Lab 03/24/18 1651  NA 132*  K 4.1  CL 97*  CO2 23  GLUCOSE 165*  BUN 32*  CREATININE 1.34*  CALCIUM 9.3  AST 22  ALT 19  ALKPHOS  55  BILITOT 0.6   Cardiac Enzymes No results for input(s): TROPONINI in the last 168 hours. RADIOLOGY:  Dg Foot Complete Left  Result Date: 03/24/2018 CLINICAL DATA:  Suspected gangrene in a diabetic patient. Foot swelling. EXAM: LEFT FOOT - COMPLETE 3+ VIEW COMPARISON:  None. FINDINGS: There is no evidence of fracture or dislocation. There is no evidence of arthropathy or other focal bone abnormality. Mild diffuse soft tissue swelling. No air is seen in the soft tissues. There are plantar and Achilles spurs. IMPRESSION: No plain film findings of osteomyelitis, nor air in the soft tissues. Electronically Signed   By: Elsie StainJohn T Curnes M.D.   On: 03/24/2018 17:17   ASSESSMENT AND PLAN:   77 year old male with diabetic foot infection  *Diabetic foot infection -Start IV vancomycin and Zosyn for now -Consult podiatry with Dr. Alberteen Spindlecline appreciated. Patient underwent IND at bedside. -Vascular consultation placed -holding of MRI per podiatry input -X-ray does not show any osteomyelitis  *Diabetes mellitus -Start sliding scale insulin, check hemoglobin A1c. hold metformin -Consult diabetic nurse -A1c is 7.2  *Acute renal failure - ATN versus prerenal -Hold metformin and lisinopril -Hydrate with IV fluids and monitor renal function  *Hypertension Continue Norvasc, add hydralazine for better blood pressure control   Case discussed with Care Management/Social Worker. Management plans discussed with the patient, family and they are in agreement.  CODE STATUS: full  DVT Prophylaxis: lovenox  TOTAL TIME TAKING CARE OF THIS PATIENT: *35* minutes.  >  50% time spent on counselling and coordination of care  POSSIBLE D/C IN *1-2 DAYS, DEPENDING ON CLINICAL CONDITION.  Note: This dictation was prepared with Dragon dictation along with smaller phrase technology. Any transcriptional errors that result from this process are unintentional.  Enedina Finner M.D on 03/25/2018 at 1:59 PM  Between 7am to  6pm - Pager - [phone]  After 6pm go to [email or web address] - Social research officer, government  [facility]  Office  [phone]  CC: Primary care physician; Wilford Corner, PA-CPatient ID: Cammy Brochure, male   DOB: [birth year], 77 y.o.   MRN: [id number]

## 2018-03-25 NOTE — Plan of Care (Signed)
  Problem: Nutrition: Goal: Adequate nutrition will be maintained Outcome: Progressing   

## 2018-03-25 NOTE — Consult Note (Signed)
Reason for Consult: Diabetic foot infection left foot. Referring Physician: Dr. Otto [name] is an 77 y.o. male.  HPI: [name] is a 77 year old male recently diagnosed with diabetes who relates a several week history of a sore on his left foot.  Thinks that he may have stepped on something.  The area continues to have worsened and recently developed some significant redness and swelling in the left foot.  Recently had some fever and chills as well as loss of appetite and presented to the emergency department where he was admitted with a diabetic foot infection.  Past Medical History:  Diagnosis Date  . Diabetes mellitus without complication [facility])     Past Surgical History:  Procedure Laterality Date  . HERNIA REPAIR      History reviewed. No pertinent family history.  Social History:  reports that he has quit smoking. His smoking use included cigarettes. He has never used smokeless tobacco. He reports that he does not drink alcohol or use drugs.  Allergies:  Allergies  Allergen Reactions  . Flu Virus Vaccine Other (See Comments) and Nausea And Vomiting    Fever, chills, vomiting.     Medications:  Scheduled: . amLODipine  2.5 mg Oral Daily  . heparin injection (subcutaneous)  5,000 Units Subcutaneous Q8H  . hydrALAZINE  25 mg Oral Q8H  . insulin aspart  0-5 Units Subcutaneous QHS  . insulin aspart  0-9 Units Subcutaneous TID WC  . insulin aspart  3 Units Subcutaneous TID WC  . pneumococcal 23 valent vaccine  0.5 mL Intramuscular Tomorrow-1000  . Tdap  0.5 mL Intramuscular Once    Results for orders placed or performed during the hospital encounter of 03/24/18 (from the past 48 hour(s))  Lactic acid, plasma     Status: None   Collection Time: 03/24/18  4:51 PM  Result Value Ref Range   Lactic Acid, Venous 1.6 0.5 - 1.9 mmol/L    Comment: Performed at [facility], [address] [place], [place] [address]  Comprehensive metabolic panel     Status:  Abnormal   Collection Time: 03/24/18  4:51 PM  Result Value Ref Range   Sodium 132 (L) 135 - 145 mmol/L   Potassium 4.1 3.5 - 5.1 mmol/L   Chloride 97 (L) 98 - 111 mmol/L    Comment: Please note change in reference range.   CO2 23 22 - 32 mmol/L   Glucose, Bld 165 (H) 70 - 99 mg/dL    Comment: Please note change in reference range.   BUN 32 (H) 8 - 23 mg/dL    Comment: Please note change in reference range.   Creatinine, Ser 1.34 (H) 0.61 - 1.24 mg/dL   Calcium 9.3 8.9 - 10.3 mg/dL   Total Protein 8.4 (H) 6.5 - 8.1 g/dL   Albumin 3.9 3.5 - 5.0 g/dL   AST 22 15 - 41 U/L   ALT 19 0 - 44 U/L    Comment: Please note change in reference range.   Alkaline Phosphatase 55 38 - 126 U/L   Total Bilirubin 0.6 0.3 - 1.2 mg/dL   GFR calc non Af Amer 49 (L) >60 mL/min   GFR calc Af Amer 57 (L) >60 mL/min    Comment: (NOTE) The eGFR has been calculated using the CKD EPI equation. This calculation has not been validated in all clinical situations. eGFR's persistently <60 mL/min signify possible Chronic Kidney Disease.    Anion gap 12 5 - 15  Comment: Performed at [facility], [address] [place], [place] [address]  CBC with Differential     Status: Abnormal   Collection Time: 03/24/18  4:51 PM  Result Value Ref Range   WBC 15.6 (H) 3.8 - 10.6 K/uL   RBC 4.11 (L) 4.40 - 5.90 MIL/uL   Hemoglobin 12.3 (L) 13.0 - 18.0 g/dL   HCT 36.1 (L) 40.0 - 52.0 %   MCV 87.9 80.0 - 100.0 fL   MCH 30.0 26.0 - 34.0 pg   MCHC 34.1 32.0 - 36.0 g/dL   RDW 13.2 11.5 - 14.5 %   Platelets 298 150 - 440 K/uL   Neutrophils Relative % 76 %   Neutro Abs 11.7 (H) 1.4 - 6.5 K/uL   Lymphocytes Relative 14 %   Lymphs Abs 2.3 1.0 - 3.6 K/uL   Monocytes Relative 8 %   Monocytes Absolute 1.3 (H) 0.2 - 1.0 K/uL   Eosinophils Relative 1 %   Eosinophils Absolute 0.2 0 - 0.7 K/uL   Basophils Relative 1 %   Basophils Absolute 0.2 (H) 0 - 0.1 K/uL    Comment: Performed at [facility], [address], [place], [place] [address]  Sedimentation rate     Status: Abnormal   Collection Time: 03/24/18  4:51 PM  Result Value Ref Range   Sed Rate 95 (H) 0 - 20 mm/hr    Comment: Performed at [facility], [address], [place], [place] [address]  Blood culture (routine x 2)     Status: None (Preliminary result)   Collection Time: 03/24/18  4:51 PM  Result Value Ref Range   Specimen Description BLOOD RIGHT ASSIST CONTROL    Special Requests      BOTTLES DRAWN AEROBIC AND ANAEROBIC Blood Culture adequate volume   Culture      NO GROWTH < 12 HOURS Performed at [facility], [address], [place], [place] [address]    Report Status PENDING   Hemoglobin A1c     Status: Abnormal   Collection Time: 03/24/18  4:51 PM  Result Value Ref Range   Hgb A1c MFr Bld 7.2 (H) 4.8 - 5.6 %    Comment: (NOTE) Pre diabetes:          5.7%-6.4% Diabetes:              >6.4% Glycemic control for   <7.0% adults with diabetes    Mean Plasma Glucose 159.94 mg/dL    Comment: Performed at [facility], [address], [place], [place] [address]  Blood culture (routine x 2)     Status: None (Preliminary result)   Collection Time: 03/24/18  8:30 PM  Result Value Ref Range   Specimen Description BLOOD BLOOD LEFT ARM    Special Requests      BOTTLES DRAWN AEROBIC AND ANAEROBIC Blood Culture adequate volume   Culture      NO GROWTH < 12 HOURS Performed at [facility], [address], [place], [place] [address]    Report Status PENDING   Glucose, capillary     Status: Abnormal   Collection Time: 03/24/18  9:55 PM  Result Value Ref Range   Glucose-Capillary 112 (H) 70 - 99 mg/dL  Glucose, capillary     Status: Abnormal   Collection Time: 03/25/18  7:47 AM  Result Value Ref Range   Glucose-Capillary 103 (H) 70 - 99 mg/dL  Glucose, capillary     Status: Abnormal  Collection Time: 03/25/18 11:48 AM  Result Value Ref Range   Glucose-Capillary 172 (H) 70 - 99  mg/dL    Dg Foot Complete Left  Result Date: 03/24/2018 CLINICAL DATA:  Suspected gangrene in a diabetic patient. Foot swelling. EXAM: LEFT FOOT - COMPLETE 3+ VIEW COMPARISON:  None. FINDINGS: There is no evidence of fracture or dislocation. There is no evidence of arthropathy or other focal bone abnormality. Mild diffuse soft tissue swelling. No air is seen in the soft tissues. There are plantar and Achilles spurs. IMPRESSION: No plain film findings of osteomyelitis, nor air in the soft tissues. Electronically Signed   By: Staci Righter M.D.   On: 03/24/2018 17:17    Review of Systems  Constitutional: Negative for chills and fever.  HENT: Negative.   Eyes: Negative.   Respiratory: Negative.   Cardiovascular: Negative.   Gastrointestinal: Negative for heartburn and nausea.  Genitourinary: Negative.   Musculoskeletal: Negative.   Skin:       Patient relates a sore on the bottom of his left foot for the last 2 to 3 weeks.  Thinks he may have stepped on something.  Recently increased redness and swelling in the left foot.  Neurological:       Does not relate any specific numbness in the feet.  Endo/Heme/Allergies: Negative.   Psychiatric/Behavioral: Negative.    Blood pressure (!) 144/62, pulse 70, temperature 98.6 F (37 C), temperature source Oral, resp. rate 18, height 5' 9" (1.753 m), weight 79.4 kg (175 lb), SpO2 99 %. Physical Exam  Cardiovascular:  DP pulse is 1/4 on the right and not clearly palpable on the left.  PT pulse is trace and thready at best bilateral.  Delayed capillary refill to the digits on the left foot.  Musculoskeletal:  Adequate range of motion of the pedal joints.  Muscle testing within normal limits.  Neurological:  Complete loss of protective threshold with a monofilament wire to the foot and toes bilateral.  Proprioception impaired.  Skin:  Significant erythema and edema in the left foot mostly along the dorsal aspect.  Some white cyanotic changes noted  distally in the left second and third toes.  Blister with abscess and underlying ulceration is noted on the plantar aspect of the left first interspace around the base of the second toe and extending dorsally through the first interspace onto the forefoot.  Some serous type fluid is drained and a culture was taken.  No clear deep extension through the superficial tissues is noted.  No clear evidence of deeper abscess at this point.    Assessment/Plan: Assessment: 1.  Cellulitis with abscess left forefoot. 2.  Diabetes with associated neuropathy. 3.  Possible peripheral vascular disease.  Plan: An I&D was performed of the abscessed area on the left foot without the need for anesthesia due to his neuropathy.  Aerobic and anaerobic culture was taken for sensitivities.  Due to the difficulty palpating pulses as well as cyanotic changes to the foot we will obtain a vascular consult for evaluation.  No clear evidence of deep extension so at this point decision was made to hold off on MRI although we may need to obtain this later.  At this point we will follow daily and see how he responds and wait for vascular surgery's input.  [name] 03/25/2018, 1:32 PM

## 2018-03-26 ENCOUNTER — Encounter
Admission: EM | Disposition: A | Discharge status: Home Health | Payer: Self-pay | Source: Home / Self Care | Attending: Internal Medicine

## 2018-03-26 DIAGNOSIS — I70248 Atherosclerosis of native arteries of left leg with ulceration of other part of lower left leg: Secondary | ICD-10-CM

## 2018-03-26 HISTORY — PX: LOWER EXTREMITY ANGIOGRAPHY: CATH118251

## 2018-03-26 LAB — BASIC METABOLIC PANEL
Anion gap: 7 (ref 5–15)
BUN: 19 mg/dL (ref 8–23)
CHLORIDE: 104 mmol/L (ref 98–111)
CO2: 24 mmol/L (ref 22–32)
Calcium: 8.8 mg/dL — ABNORMAL LOW (ref 8.9–10.3)
Creatinine, Ser: 1.06 mg/dL (ref 0.61–1.24)
GFR calc non Af Amer: >60 mL/min (ref >60)
Glucose, Bld: 122 mg/dL — ABNORMAL HIGH (ref 70–99)
POTASSIUM: 4.2 mmol/L (ref 3.5–5.1)
Sodium: 135 mmol/L (ref 135–145)

## 2018-03-26 LAB — GLUCOSE, CAPILLARY
GLUCOSE-CAPILLARY: 95 mg/dL (ref 70–99)
Glucose-Capillary: 114 mg/dL — ABNORMAL HIGH (ref 70–99)
Glucose-Capillary: 113 mg/dL — ABNORMAL HIGH (ref 70–99)
Glucose-Capillary: 198 mg/dL — ABNORMAL HIGH (ref 70–99)

## 2018-03-26 LAB — HIV ANTIBODY (ROUTINE TESTING W REFLEX): HIV Screen 4th Generation wRfx: NONREACTIVE

## 2018-03-26 SURGERY — LOWER EXTREMITY ANGIOGRAPHY
Anesthesia: Moderate Sedation | Laterality: Left

## 2018-03-26 MED ORDER — HEPARIN SODIUM (PORCINE) 1000 UNIT/ML IJ SOLN
INTRAMUSCULAR | Status: AC
  Filled 2018-03-26: qty 1

## 2018-03-26 MED ORDER — CEFAZOLIN SODIUM-DEXTROSE 2-4 GM/100ML-% IV SOLN
INTRAVENOUS | Status: AC
  Filled 2018-03-26: qty 100

## 2018-03-26 MED ORDER — SODIUM CHLORIDE 0.9 % IV SOLN: 250.0000 mL | INTRAVENOUS | Status: DC | PRN

## 2018-03-26 MED ORDER — MORPHINE SULFATE (PF) 2 MG/ML IV SOLN: 2.0000 mg | INTRAVENOUS | Status: DC | PRN

## 2018-03-26 MED ORDER — CLOPIDOGREL BISULFATE 75 MG PO TABS
75.0000 mg | ORAL_TABLET | Freq: Every day | ORAL | Status: DC
  Administered 2018-03-27 – 2018-03-28 (×2): 75 mg via ORAL
  Filled 2018-03-26 (×2): qty 1

## 2018-03-26 MED ORDER — FENTANYL CITRATE (PF) 100 MCG/2ML IJ SOLN
INTRAMUSCULAR | Status: AC
  Filled 2018-03-26: qty 4

## 2018-03-26 MED ORDER — SODIUM CHLORIDE 0.9% FLUSH: 3.0000 mL | INTRAVENOUS | Status: DC | PRN

## 2018-03-26 MED ORDER — LIDOCAINE HCL (PF) 1 % IJ SOLN
INTRAMUSCULAR | Status: AC
  Filled 2018-03-26: qty 30

## 2018-03-26 MED ORDER — HEPARIN SODIUM (PORCINE) 1000 UNIT/ML IJ SOLN
INTRAMUSCULAR | Status: DC | PRN
  Administered 2018-03-26: 4000 [IU] via INTRAVENOUS

## 2018-03-26 MED ORDER — OXYCODONE HCL 5 MG PO TABS
5.0000 mg | ORAL_TABLET | ORAL | Status: DC | PRN
  Administered 2018-03-28: 5 mg via ORAL
  Administered 2018-03-29 (×2): 10 mg via ORAL
  Filled 2018-03-26: qty 1
  Filled 2018-03-26: qty 2
  Filled 2018-03-26 (×2): qty 1

## 2018-03-26 MED ORDER — HEPARIN (PORCINE) IN NACL 1000-0.9 UT/500ML-% IV SOLN
INTRAVENOUS | Status: AC
  Filled 2018-03-26: qty 1000

## 2018-03-26 MED ORDER — CLOPIDOGREL BISULFATE 75 MG PO TABS
150.0000 mg | ORAL_TABLET | Freq: Once | ORAL | Status: AC
  Administered 2018-03-26: 150 mg via ORAL
  Filled 2018-03-26: qty 2

## 2018-03-26 MED ORDER — SODIUM CHLORIDE 0.9 % IV SOLN
INTRAVENOUS | Status: DC
  Administered 2018-03-26 – 2018-03-28 (×2): via INTRAVENOUS

## 2018-03-26 MED ORDER — IOPAMIDOL (ISOVUE-300) INJECTION 61%
INTRAVENOUS | Status: DC | PRN
  Administered 2018-03-26: 90 mL via INTRA_ARTERIAL

## 2018-03-26 MED ORDER — MIDAZOLAM HCL 2 MG/2ML IJ SOLN
INTRAMUSCULAR | Status: DC | PRN
  Administered 2018-03-26 (×2): 1 mg via INTRAVENOUS
  Administered 2018-03-26: 2 mg via INTRAVENOUS
  Administered 2018-03-26: 1 mg via INTRAVENOUS

## 2018-03-26 MED ORDER — MUPIROCIN 2 % EX OINT
TOPICAL_OINTMENT | Freq: Every day | CUTANEOUS | Status: DC
  Administered 2018-03-26 – 2018-03-28 (×3): via TOPICAL
  Filled 2018-03-26: qty 22

## 2018-03-26 MED ORDER — MIDAZOLAM HCL 5 MG/5ML IJ SOLN
INTRAMUSCULAR | Status: AC
  Filled 2018-03-26: qty 10

## 2018-03-26 MED ORDER — FENTANYL CITRATE (PF) 100 MCG/2ML IJ SOLN
INTRAMUSCULAR | Status: DC | PRN
  Administered 2018-03-26: 50 ug via INTRAVENOUS
  Administered 2018-03-26 (×3): 25 ug via INTRAVENOUS

## 2018-03-26 MED ORDER — SODIUM CHLORIDE 0.9% FLUSH
3.0000 mL | Freq: Two times a day (BID) | INTRAVENOUS | Status: DC
  Administered 2018-03-27 – 2018-03-30 (×6): 3 mL via INTRAVENOUS

## 2018-03-26 MED ORDER — SODIUM CHLORIDE 0.9 % IV SOLN: INTRAVENOUS | Status: DC

## 2018-03-26 SURGICAL SUPPLY — 27 items
BALLN LUTONIX 018 6X40X130 (BALLOONS) ×3
BALLN LUTONIX DCB 4X60X130 (BALLOONS) ×3
BALLN ULTRVRSE 2.5X300X150 (BALLOONS) ×3
BALLN ULTRVRSE 2.5X40X150 (BALLOONS) ×3
BALLN ULTRVRSE 3X300X150 (BALLOONS) ×2
BALLN ULTRVRSE 3X300X150 OTW (BALLOONS) ×1
BALLOON LUTONIX 018 6X40X130 (BALLOONS) ×1 IMPLANT
BALLOON LUTONIX DCB 4X60X130 (BALLOONS) ×1 IMPLANT
BALLOON ULTRVRSE 150X40X2.5 (BALLOONS) ×1 IMPLANT
BALLOON ULTRVRSE 2.5X300X150 (BALLOONS) ×1 IMPLANT
BALLOON ULTRVRSE 3X300X150 OTW (BALLOONS) ×1 IMPLANT
CATH CROSSER S6 154CM (CATHETERS) ×3 IMPLANT
CATH PIG 70CM (CATHETERS) ×3 IMPLANT
CATH USHER TPER 130CM (CATHETERS) ×3 IMPLANT
CATH VERT 5FR 125CM (CATHETERS) ×3 IMPLANT
DEVICE PRESTO INFLATION (MISCELLANEOUS) ×3 IMPLANT
DEVICE STARCLOSE SE CLOSURE (Vascular Products) ×3 IMPLANT
KIT FLOWMATE PROCEDURAL (KITS) ×6 IMPLANT
NEEDLE ENTRY 21GA 7CM ECHOTIP (NEEDLE) ×3 IMPLANT
PACK ANGIOGRAPHY (CUSTOM PROCEDURE TRAY) ×3 IMPLANT
SET INTRO CAPELLA COAXIAL (SET/KITS/TRAYS/PACK) ×3 IMPLANT
SHEATH BRITE TIP 5FRX11 (SHEATH) ×3 IMPLANT
SHEATH RAABE 7FR (SHEATH) ×3 IMPLANT
TUBING CONTRAST HIGH PRESS 72 (TUBING) ×3 IMPLANT
WIRE AQUATRACK .035X260CM (WIRE) ×3 IMPLANT
WIRE G V18X300CM (WIRE) ×3 IMPLANT
WIRE J 3MM .035X145CM (WIRE) ×3 IMPLANT

## 2018-03-26 NOTE — Progress Notes (Signed)
Patient clincally stable post angiogram,vitals stable , no bleeding nor hematoma at right groin site. Denies complaints. Report called to care nurse with plan reviewed.

## 2018-03-26 NOTE — Progress Notes (Signed)
[facility] - [place] [facility]   PATIENT NAME: [name]    MR#:  [id number]  DATE OF BIRTH:  [birth year]  SUBJECTIVE:  came in with redness and infection of the left foot. Denies much pain. No fever.  REVIEW OF SYSTEMS:   Review of Systems  Constitutional: Negative for chills, fever and weight loss.  HENT: Negative for ear discharge, ear pain and nosebleeds.   Eyes: Negative for blurred vision, pain and discharge.  Respiratory: Negative for sputum production, shortness of breath, wheezing and stridor.   Cardiovascular: Negative for chest pain, palpitations, orthopnea and PND.  Gastrointestinal: Negative for abdominal pain, diarrhea, nausea and vomiting.  Genitourinary: Negative for frequency and urgency.  Musculoskeletal: Negative for back pain and joint pain.  Neurological: Negative for sensory change, speech change, focal weakness and weakness.  Psychiatric/Behavioral: Negative for depression and hallucinations. The patient is not nervous/anxious.    Tolerating Diet:yes Tolerating PT:   DRUG ALLERGIES:   Allergies  Allergen Reactions  . Flu Virus Vaccine Other (See Comments) and Nausea And Vomiting    Fever, chills, vomiting.     VITALS:  Blood pressure (!) 146/63, pulse 78, temperature 98.8 F (37.1 C), resp. rate 18, height 5\' 9"  (1.753 m), weight 79.4 kg (175 lb), SpO2 96 %.  PHYSICAL EXAMINATION:   Physical Exam  GENERAL:  77 y.o.-year-old patient lying in the bed with no acute distress.  EYES: Pupils equal, round, reactive to light and accommodation. No scleral icterus. Extraocular muscles intact.  HEENT: Head atraumatic, normocephalic. Oropharynx and nasopharynx clear.  NECK:  Supple, no jugular venous distention. No thyroid enlargement, no tenderness.  LUNGS: Normal breath sounds bilaterally, no wheezing, rales, rhonchi. No use of accessory muscles of respiration.  CARDIOVASCULAR: S1, S2 normal. No murmurs, rubs, or gallops.   ABDOMEN: Soft, nontender, nondistended. Bowel sounds present. No organomegaly or mass.  EXTREMITIES: No cyanosis, clubbing or edema b/l.   Left foot cellulitis with edema left foot along with white cyanotic changes consistent most likely with abscess--now dressing + NEUROLOGIC: Cranial nerves II through XII are intact. No focal Motor or sensory deficits b/l.   PSYCHIATRIC:  patient is alert and oriented x 3.  SKIN: No obvious rash, lesion, or ulcer.   LABORATORY PANEL:  CBC Recent Labs  Lab 03/24/18 1651  WBC 15.6*  HGB 12.3*  HCT 36.1*  PLT 298    Chemistries  Recent Labs  Lab 03/24/18 1651 03/26/18 0519  NA 132* 135  K 4.1 4.2  CL 97* 104  CO2 23 24  GLUCOSE 165* 122*  BUN 32* 19  CREATININE 1.34* 1.06  CALCIUM 9.3 8.8*  AST 22  --   ALT 19  --   ALKPHOS 55  --   BILITOT 0.6  --    Cardiac Enzymes No results for input(s): TROPONINI in the last 168 hours. RADIOLOGY:  Dg Foot Complete Left  Result Date: 03/24/2018 CLINICAL DATA:  Suspected gangrene in a diabetic patient. Foot swelling. EXAM: LEFT FOOT - COMPLETE 3+ VIEW COMPARISON:  None. FINDINGS: There is no evidence of fracture or dislocation. There is no evidence of arthropathy or other focal bone abnormality. Mild diffuse soft tissue swelling. No air is seen in the soft tissues. There are plantar and Achilles spurs. IMPRESSION: No plain film findings of osteomyelitis, nor air in the soft tissues. Electronically Signed   By: Elsie Stain M.D.   On: 03/24/2018 17:17   ASSESSMENT AND PLAN:   77 year old male with  diabetic foot infection  *Diabetic foot infection -Start IV vancomycin and Zosyn  -Consult podiatry with Dr. Alberteen Spindlecline appreciated. Patient underwent I and D at bedside. -Vascular consultation placed---dr Schnier to do angiogram today -holding of MRI per podiatry input -X-ray does not show any osteomyelitis  *Diabetes mellitus -Start sliding scale insulin, check hemoglobin A1c. hold metformin -Consult  diabetic nurse -A1c is 7.2  *Acute renal failure - ATN versus prerenal -Hold metformin and lisinopril -Hydrate with IV fluids and monitor renal function  *Hypertension Continue Norvasc, add hydralazine for better blood pressure control   Case discussed with Care Management/Social Worker. Management plans discussed with the patient, family and they are in agreement.  CODE STATUS: full  DVT Prophylaxis: lovenox  TOTAL TIME TAKING CARE OF THIS PATIENT: *35* minutes.  >50% time spent on counselling and coordination of care  POSSIBLE D/C IN *1-2 DAYS, DEPENDING ON CLINICAL CONDITION.  Note: This dictation was prepared with Dragon dictation along with smaller phrase technology. Any transcriptional errors that result from this process are unintentional.  Enedina FinnerSona Adessa Primiano M.D on 03/26/2018 at 2:37 PM  Between 7am to 6pm - Pager - [phone]  After 6pm go to [email or web address] - Social research officer, governmentpassword EPAS [facility]  [facility]  Office  [phone]  CC: Primary care physician; Wilford CornerWhitaker, Jason Hestle, PA-CPatient ID: Charles BrochureHoward D [name], male   DOB: [birth year], 77 y.o.   MRN: [id number]

## 2018-03-26 NOTE — Progress Notes (Signed)
   Subjective/Chief Complaint: Patient seen.  Having some pain still in the left foot.   Objective: Vital signs in last 24 hours: Temp:  [98.3 F (36.8 C)-99.4 F (37.4 C)] 98.4 F (36.9 C) (07/10 0738) Pulse Rate:  [72-76] 76 (07/10 0738) Resp:  [17-18] 18 (07/10 0738) BP: (128-178)/(59-80) 169/80 (07/10 1033) SpO2:  [96 %-98 %] 98 % (07/10 0738) Last BM Date: 03/25/18  Intake/Output from previous day: 07/09 0701 - 07/10 0700 In: 691.1 [P.O.:290; IV Piggyback:401.1] Out: 1480 [Urine:1480] Intake/Output this shift: No intake/output data recorded.  Only mild drainage is noted on the bandaging.  Upon removal the erythema is some improved as well as edema.  Still cyanotic discoloration present in the second and third toes as well as some along the plantar forefoot.  Some blackening eschar at the area of abscess removal yesterday.  Lab Results:  Recent Labs    03/24/18 1651  WBC 15.6*  HGB 12.3*  HCT 36.1*  PLT 298   BMET Recent Labs    03/24/18 1651 03/26/18 0519  NA 132* 135  K 4.1 4.2  CL 97* 104  CO2 23 24  GLUCOSE 165* 122*  BUN 32* 19  CREATININE 1.34* 1.06  CALCIUM 9.3 8.8*   PT/INR No results for input(s): LABPROT, INR in the last 72 hours. ABG No results for input(s): PHART, HCO3 in the last 72 hours.  Invalid input(s): PCO2, PO2  Studies/Results: Dg Foot Complete Left  Result Date: 03/24/2018 CLINICAL DATA:  Suspected gangrene in a diabetic patient. Foot swelling. EXAM: LEFT FOOT - COMPLETE 3+ VIEW COMPARISON:  None. FINDINGS: There is no evidence of fracture or dislocation. There is no evidence of arthropathy or other focal bone abnormality. Mild diffuse soft tissue swelling. No air is seen in the soft tissues. There are plantar and Achilles spurs. IMPRESSION: No plain film findings of osteomyelitis, nor air in the soft tissues. Electronically Signed   By: Elsie StainJohn T Curnes M.D.   On: 03/24/2018 17:17    Anti-infectives: Anti-infectives (From  admission, onward)   Start     Dose/Rate Route Frequency Ordered Stop   03/25/18 1200  vancomycin (VANCOCIN) 1,250 mg in sodium chloride 0.9 % 250 mL IVPB     1,250 mg 166.7 mL/hr over 90 Minutes Intravenous Every 24 hours 03/24/18 2058     03/24/18 2200  piperacillin-tazobactam (ZOSYN) IVPB 3.375 g     3.375 g 12.5 mL/hr over 240 Minutes Intravenous Every 8 hours 03/24/18 2050     03/24/18 2145  piperacillin-tazobactam (ZOSYN) IVPB 3.375 g  Status:  Discontinued     3.375 g 100 mL/hr over 30 Minutes Intravenous  Once 03/24/18 2134 03/24/18 2136   03/24/18 2145  vancomycin (VANCOCIN) IVPB 1000 mg/200 mL premix  Status:  Discontinued     1,000 mg 200 mL/hr over 60 Minutes Intravenous  Once 03/24/18 2134 03/24/18 2136   03/24/18 2030  vancomycin (VANCOCIN) 1,750 mg in sodium chloride 0.9 % 500 mL IVPB     1,750 mg 250 mL/hr over 120 Minutes Intravenous  Once 03/24/18 1953 03/24/18 2132      Assessment/Plan: s/p Procedure(s): Lower Extremity Angiography (Left) Assessment: 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   Plan: Order for mupirocin ointment and a light bandage to the left foot.  Awaiting vascular intervention.  LOS: 2 days    Ricci Barkerodd W Jolissa Kapral 03/26/2018

## 2018-03-26 NOTE — Op Note (Signed)
[facility] Percutaneous Study/Intervention Procedural Note   Date of Surgery: 03/26/2018  Surgeon:  Katha Cabal, MD.  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 Introduction catheter into left lower extremity 3rd order catheter placement with additional third order catheter placement 2. Contrast injection left lower extremity for distal runoff   3. Crosser atherectomy of the left posterior tibial artery and crosser atherectomy of the left peroneal 4. Percutaneous transluminal angioplasty left posterior tibial artery with a 3 mm balloon and the left peroneal artery with a 2.5 mm balloon             5.  Percutaneous transluminal angioplasty of the SFA and popliteal arteries with a 6 mm Lutonix drug-eluting balloon                      6.   Star close closure right common femoral arteriotomy                Anesthesia: Conscious sedation was administered under my direct supervision by the interventional radiology RN. IV Versed plus fentanyl were utilized. Continuous ECG, pulse oximetry and blood pressure was monitored throughout the entire procedure. Conscious sedation was for a total of 112 minutes.  Sheath: 6 French Raby  Contrast: 90 cc  Fluoroscopy Time: 15.1 minutes  Indications: [name] presents with atherosclerotic occlusive disease bilateral lower extremities. The patient has developed ulceration and gangrene of the soft tissues of the left lower extremity. This places the patient at high risk for limb loss and amputation. The risks and benefits are reviewed all questions answered patient agrees to proceed.  Procedure: [name] is a 77 y.o. y.o. male who was identified and appropriate procedural time out was performed. The patient  was then placed supine on the table and prepped and draped in the usual sterile fashion.   Ultrasound was placed in the sterile sleeve and the right groin was evaluated the right common femoral artery was echolucent and pulsatile indicating patency.  Image was recorded for the permanent record and under real-time visualization a microneedle was inserted into the common femoral artery microwire followed by a micro-sheath.  A J-wire was then advanced through the micro-sheath and a  5 [place] sheath was then inserted over a J-wire. J-wire was then advanced and a 5 French pigtail catheter was positioned at the level of T12. AP projection of the aorta was then obtained. Pigtail catheter was repositioned to above the bifurcation and a RAO view of the pelvis was obtained.  Subsequently a pigtail catheter with the stiff angle Glidewire was used to cross the aortic bifurcation the catheter wire were advanced down into the left distal external iliac artery. Oblique view of the femoral bifurcation was then obtained and subsequently the wire was reintroduced and the pigtail catheter negotiated into the SFA representing third order catheter placement. Distal runoff was then performed.  5000 units of heparin was then given and allowed to circulate and a 6 [place] Raby sheath was advanced up and over the bifurcation and positioned in the common femoral artery.  Wire and catheter were then used to negotiate the SFA stenosis and the catheter was then positioned in the distal popliteal where magnified imaging of the trifurcation was performed.  The S6 Crosser catheter was then prepped on the field and a straight Usher catheter was advanced into the cul-de-sac  of the posterior tibial artery under magnified imaging. Using the Crosser catheter the occlusion of the posterior tibial artery was negotiated.  The crosser catheter and ulcer were negotiated all the way down to the division of the lateral and medial plantar arteries.   Injection of contrast through the Usher confirmed intraluminal positioning.  A V 18 wire was then advanced down into the medial plantar branch.  Distal runoff was then completed by hand injection through the catheter verifying intraluminal position and patency of the pedal arch.  A 2.5 mm x 6 cm Ultraverse balloon was then used to angioplasty the distal posterior tibial artery to the level of the plantar vessels. Inflation was to 10-12 atm for 1 full minute.  Following this a 3.0 mm x 30 cm Ultraverse balloon was advanced over the wire positioned at the distal marker at the level of the medial malleolus and angioplasty of the length of the posterior tibial was performed.  Again inflation was to 10 atm for 1 full minute.  Follow-up imaging demonstrated patency of the tibial artery but some residual narrowing at the bifurcation plantar vessels and the 2.5 mm balloon was advanced and this area was retreated for 1 minute.   A 4 mm x 6 cm Lutonix drug-eluting balloon was then advanced across the distal popliteal and into the origin of the posterior tibial.  Inflation was to 6 atm for 1 full minute.  Excellent result at the origin of the posterior tibial artery was noted and attention was turned to the peroneal.  The wire and Kumpe catheter were then negotiated into the peroneal artery.  The 2.5 mm balloon was used to cross the origin of the peroneal was inflated to 10 atm for 1 full minute.  Following this the Usher catheter was again advanced over the V 18 wire in the Ryder System atherectomy catheter advanced down into the peroneal.  The Ryder System catheter was then negotiated down to the level of the ankles to the level of the bifurcation of the distal peroneal.  Usher catheter tract.  Injection through the catheter confirmed intraluminal positioning.  V 18 wire was reintroduced and a 2.5 mm x 30 cm Ultraverse balloon was used to angioplasty the peroneal.  Follow-up imaging demonstrated an excellent result and  attention was turned to the SFA at Hunter's canal.  A 6 mm x 40 mm Lutonix drug-eluting balloon was used to angioplasty the superficial femoral artery. Inflation was to 14 atm atmospheres for 2 minutes. Follow-up imaging demonstrated patency with less than 5% residual stenosis.  Distal runoff was then reassessed.  After review of these images the sheath is pulled into the right external iliac oblique of the common femoral is obtained and a Star close device deployed. There no immediate Complications.  Findings: The abdominal aorta is opacified with a bolus injection contrast. Renal arteries are single and widely patent. The aorta itself has diffuse disease but no hemodynamically significant lesions. The common and external iliac arteries are widely patent bilaterally.  The left common femoral is widely patent as is the profunda femoris.  The SFA does indeed have a significant stenosis which is focal at Hunter's canal and measures approximately 70% diameter reduction.  The distal popliteal demonstrates increasing disease with a 60% stenosis near its distal and and the trifurcation is heavily diseased with occlusion of all 3 tibial vessels.  There is reconstitution of the posterior tibial at the level of the lateral plantars.  There is intermittent visualization of the peroneal  but it reoccludes in its distal one third there is nonvisualization of the entire length of the anterior tibial  Following crosser atherectomy with angioplasty of the posterior tibial now is widely patent in-line flow and looks quite nice with less than 5% residual stenosis.  Following crosser atherectomy with angioplasty of the peroneal it is now widely patent with in-line flow and shows less than 5% residual stenosis.  Angioplasty of the SFA and popliteal with a Lutonix balloon shows an excellent result with less than 10% residual stenosis.   Disposition: Patient was taken to the recovery room in stable  condition having tolerated the procedure well.  [name] 03/26/2018,4:51 PM

## 2018-03-27 ENCOUNTER — Inpatient Hospital Stay: Payer: Medicare Other

## 2018-03-27 ENCOUNTER — Encounter: Payer: Self-pay | Admitting: Vascular Surgery

## 2018-03-27 LAB — GLUCOSE, CAPILLARY
GLUCOSE-CAPILLARY: 210 mg/dL — AB (ref 70–99)
GLUCOSE-CAPILLARY: 152 mg/dL — AB (ref 70–99)
Glucose-Capillary: 105 mg/dL — ABNORMAL HIGH (ref 70–99)
Glucose-Capillary: 133 mg/dL — ABNORMAL HIGH (ref 70–99)

## 2018-03-27 LAB — HEMOGLOBIN A1C
Hgb A1c MFr Bld: 7.2 % — ABNORMAL HIGH (ref 4.8–5.6)
MEAN PLASMA GLUCOSE: 159.94 mg/dL

## 2018-03-27 LAB — VANCOMYCIN, TROUGH: Vancomycin Tr: 10 ug/mL — ABNORMAL LOW (ref 15–20)

## 2018-03-27 MED ORDER — VANCOMYCIN HCL 10 G IV SOLR
1250.0000 mg | INTRAVENOUS | Status: DC
  Administered 2018-03-28 – 2018-03-29 (×3): 1250 mg via INTRAVENOUS
  Filled 2018-03-27 (×5): qty 1250

## 2018-03-27 NOTE — Progress Notes (Signed)
1 Day Post-Op   Subjective/Chief Complaint: Patient seen.  Still some soreness in the left foot.   Objective: Vital signs in last 24 hours: Temp:  [97.4 F (36.3 C)-101 F (38.3 C)] 99.2 F (37.3 C) (07/11 1214) Pulse Rate:  [73-93] 73 (07/11 1214) Resp:  [16-19] 19 (07/11 0425) BP: (116-193)/(58-78) 134/61 (07/11 1214) SpO2:  [95 %-100 %] 96 % (07/11 1214) Last BM Date: 03/25/18  Intake/Output from previous day: 07/10 0701 - 07/11 0700 In: 406.3 [I.V.:406.3] Out: 100 [Urine:100] Intake/Output this shift: Total I/O In: 120 [P.O.:120] Out: -   Minimal drainage noted on the bandaging.  Still significant cyanotic discoloration in the left second and third toes, more bluish on the second and whitish on the third.  Eschar intact in the first interspace and plantar and dorsal.  Some mild drainage on compression of the second interspace area.  Overall erythema and edema in the foot are still improving.  Lab Results:  Recent Labs    03/24/18 1651  WBC 15.6*  HGB 12.3*  HCT 36.1*  PLT 298   BMET Recent Labs    03/24/18 1651 03/26/18 0519  NA 132* 135  K 4.1 4.2  CL 97* 104  CO2 23 24  GLUCOSE 165* 122*  BUN 32* 19  CREATININE 1.34* 1.06  CALCIUM 9.3 8.8*   PT/INR No results for input(s): LABPROT, INR in the last 72 hours. ABG No results for input(s): PHART, HCO3 in the last 72 hours.  Invalid input(s): PCO2, PO2  Studies/Results: No results found.  Anti-infectives: Anti-infectives (From admission, onward)   Start     Dose/Rate Route Frequency Ordered Stop   03/28/18 0600  vancomycin (VANCOCIN) 1,250 mg in sodium chloride 0.9 % 250 mL IVPB     1,250 mg 166.7 mL/hr over 90 Minutes Intravenous Every 18 hours 03/27/18 1227     03/26/18 1259  ceFAZolin (ANCEF) 2-4 GM/100ML-% IVPB    Note to Pharmacy:  Littie DeedsJarrett, Catherine  : cabinet override      03/26/18 1259 03/27/18 0114   03/25/18 1200  vancomycin (VANCOCIN) 1,250 mg in sodium chloride 0.9 % 250 mL IVPB   Status:  Discontinued     1,250 mg 166.7 mL/hr over 90 Minutes Intravenous Every 24 hours 03/24/18 2058 03/27/18 1227   03/24/18 2200  piperacillin-tazobactam (ZOSYN) IVPB 3.375 g     3.375 g 12.5 mL/hr over 240 Minutes Intravenous Every 8 hours 03/24/18 2050     03/24/18 2145  piperacillin-tazobactam (ZOSYN) IVPB 3.375 g  Status:  Discontinued     3.375 g 100 mL/hr over 30 Minutes Intravenous  Once 03/24/18 2134 03/24/18 2136   03/24/18 2145  vancomycin (VANCOCIN) IVPB 1000 mg/200 mL premix  Status:  Discontinued     1,000 mg 200 mL/hr over 60 Minutes Intravenous  Once 03/24/18 2134 03/24/18 2136   03/24/18 2030  vancomycin (VANCOCIN) 1,750 mg in sodium chloride 0.9 % 500 mL IVPB     1,750 mg 250 mL/hr over 120 Minutes Intravenous  Once 03/24/18 1953 03/24/18 2132      Assessment/Plan: s/p Procedure(s): Lower Extremity Angiography (Left) Assessment: Abscess with continued gangrenous changes left second and third toe status post vascular intervention.   Plan: At this point I will plan to go ahead with MRI to evaluate for any deeper abscess.  Also think we should give it another day or 2 for demarcation as he may still need amputation of the second and third toes.  Discussed giving this another day or  2 with the recent vascular intervention.  Also waiting on culture results.  Reassess the wound tomorrow.  LOS: 3 days    Ricci Barker 03/27/2018

## 2018-03-27 NOTE — Care Management Important Message (Signed)
Important Message  Patient Details  Name: [name] MRN: [id number] Date of Birth: 08/05/1941   Medicare Important Message Given:  Yes    [name] 03/27/2018, 10:42 AM

## 2018-03-27 NOTE — Progress Notes (Signed)
[facility] [place] [facility]   PATIENT NAME: Charles CongoHoward Nannini    MR#:  [id number]  DATE OF BIRTH:  October 20, 1940  SUBJECTIVE:  CHIEF COMPLAINT:   Chief Complaint  Patient presents with  . Foot Pain   -Status post left leg angiogram yesterday with atherectomy and angioplasty -Left foot in dressing.  Cellulitis is improving  REVIEW OF SYSTEMS:  Review of Systems  Constitutional: Negative for chills, fever and malaise/fatigue.  HENT: Positive for hearing loss. Negative for congestion and nosebleeds.   Eyes: Negative for blurred vision and double vision.  Respiratory: Negative for cough, shortness of breath and wheezing.   Cardiovascular: Negative for chest pain and palpitations.  Gastrointestinal: Negative for abdominal pain, constipation, diarrhea, nausea and vomiting.  Genitourinary: Negative for dysuria.  Musculoskeletal: Positive for joint pain.  Neurological: Negative for dizziness, focal weakness, seizures, weakness and headaches.  Psychiatric/Behavioral: Negative for depression.    DRUG ALLERGIES:   Allergies  Allergen Reactions  . Flu Virus Vaccine Other (See Comments) and Nausea And Vomiting    Fever, chills, vomiting.     VITALS:  Blood pressure 139/63, pulse 74, temperature 99.2 F (37.3 C), temperature source Oral, resp. rate 19, height 5\' 9"  (1.753 m), weight 79.4 kg (175 lb), SpO2 95 %.  PHYSICAL EXAMINATION:  Physical Exam  GENERAL:  77 y.o.-year-old patient lying in the bed with no acute distress.  EYES: Pupils equal, round, reactive to light and accommodation. No scleral icterus. Extraocular muscles intact.  HEENT: Head atraumatic, normocephalic. Oropharynx and nasopharynx clear.  NECK:  Supple, no jugular venous distention. No thyroid enlargement, no tenderness.  LUNGS: Normal breath sounds bilaterally, no wheezing, rales,rhonchi or crepitation. No use of accessory muscles of respiration.  CARDIOVASCULAR: S1, S2 normal. No  murmurs, rubs, or gallops.  ABDOMEN: Soft, nontender, nondistended. Bowel sounds present. No organomegaly or mass.  EXTREMITIES: Feeble pedal pulses, left foot and dressing.  Redness still across the forefoot.  No pedal edema, cyanosis, or clubbing.  NEUROLOGIC: Cranial nerves II through XII are intact. Muscle strength 5/5 in all extremities. Sensation intact. Gait not checked.  PSYCHIATRIC: The patient is alert and oriented x 3.  SKIN: No obvious rash, lesion, or ulcer.    LABORATORY PANEL:   CBC Recent Labs  Lab 03/24/18 1651  WBC 15.6*  HGB 12.3*  HCT 36.1*  PLT 298   ------------------------------------------------------------------------------------------------------------------  Chemistries  Recent Labs  Lab 03/24/18 1651 03/26/18 0519  NA 132* 135  K 4.1 4.2  CL 97* 104  CO2 23 24  GLUCOSE 165* 122*  BUN 32* 19  CREATININE 1.34* 1.06  CALCIUM 9.3 8.8*  AST 22  --   ALT 19  --   ALKPHOS 55  --   BILITOT 0.6  --    ------------------------------------------------------------------------------------------------------------------  Cardiac Enzymes No results for input(s): TROPONINI in the last 168 hours. ------------------------------------------------------------------------------------------------------------------  RADIOLOGY:  No results found.  EKG:  No orders found for this or any previous visit.  ASSESSMENT AND PLAN:   77 year old male with past medical history significant for hypertension and diabetes presents to hospital secondary to left foot cellulitis and abscess  1.  Diabetic foot infection with left foot cellulitis and abscess-appreciate podiatry consult -Patient had bedside I&D done.  Cultures growing gram-positive cocci and gram variable rods -On vancomycin and Zosyn for now. -Status post angiogram of the left leg and atherectomy and angioplasty done.  Appreciate vascular consult-started on Plavix for the same -X-ray does not show any  osteomyelitis -Postop boot available, ambulate today. Per podiatry- discharge tomorrow  2.  Acute renal failure-lisinopril and metformin on hold. Improved with IV fluids.  3.  Diabetes mellitus-sliding scale insulin for now.  A1c is pending  4.  Hypertension-  on Norvasc  5.  DVT prophylaxis-subcutaneous heparin  Patient is independent at baseline.  Discharge tomorrow per podiatry    All the records are reviewed and case discussed with Care Management/Social Workerr. Management plans discussed with the patient, family and they are in agreement.  CODE STATUS: Full code  TOTAL TIME TAKING CARE OF THIS PATIENT: 36 minutes.   POSSIBLE D/C tomorrow, DEPENDING ON CLINICAL CONDITION.   Enid Baas M.D on 03/27/2018 at 9:03 AM  Between 7am to 6pm - Pager - [phone]  After 6pm go to [email or web address] - Social research officer, government  [facility]  Office  [phone]  CC: Primary care physician; Whitaker, [employer], PA-C

## 2018-03-27 NOTE — Consult Note (Signed)
Pharmacy Antibiotic Note  Charles BrochureHoward D [name] is a 77 y.o. male admitted on 03/24/2018 with Diabetic Foot Infection .  Pharmacy has been consulted for Vancomycin and Zosyn  Dosing. Patient received vancomycin 1750mg  IV x 1 dose in ED.    Plan: Ke: 0.043   T1/2: 16.2    Vd: 55.58  Start Vancomycin 1250 IV every 24 hours with 15 hour stack dosing.  Goal trough 15-20 mcg/mL. Calculated trough at Css is 15.6. Trough level prior to 4th dose.   Start Zosyn 3.375 IV EI every 8 hours.   Height: 5\' 9"  (175.3 cm) Weight: 175 lb (79.4 kg) IBW/kg (Calculated) : 70.7  Temp (24hrs), Avg:99.4 F (37.4 C), Min:97.4 F (36.3 C), Max:101 F (38.3 C)  Recent Labs  Lab 03/24/18 1651 03/26/18 0519 03/27/18 1120  WBC 15.6*  --   --   CREATININE 1.34* 1.06  --   LATICACIDVEN 1.6  --   --   VANCOTROUGH  --   --  10*    Estimated Creatinine Clearance: 58.4 mL/min (by C-G formula based on SCr of 1.06 mg/dL).    Allergies  Allergen Reactions  . Flu Virus Vaccine Other (See Comments) and Nausea And Vomiting    Fever, chills, vomiting.     Antimicrobials this admission: 7/8 Zosyn  >>  7/8 vancomycin >>   Dose adjustments this admission: 7/11 1130 vanc level 10. Changed to 1250 mg q 18 hours. Level before 4th new dose.  Microbiology results: 7/8 BCx: pending  Thank you for allowing pharmacy to be a part of this patient's care.  Erich MontaneMcBane,Rufina Kimery S, PharmD, BCPS Clinical Pharmacist 03/27/2018 12:28 PM

## 2018-03-28 ENCOUNTER — Encounter: Payer: Self-pay | Admitting: Anesthesiology

## 2018-03-28 ENCOUNTER — Encounter
Admission: EM | Disposition: A | Discharge status: Home Health | Payer: Self-pay | Source: Home / Self Care | Attending: Internal Medicine

## 2018-03-28 ENCOUNTER — Inpatient Hospital Stay: Payer: Medicare Other | Admitting: Certified Registered"

## 2018-03-28 HISTORY — PX: AMPUTATION TOE: SHX6595

## 2018-03-28 LAB — BASIC METABOLIC PANEL
Anion gap: 10 (ref 5–15)
BUN: 18 mg/dL (ref 8–23)
CHLORIDE: 102 mmol/L (ref 98–111)
CO2: 23 mmol/L (ref 22–32)
CREATININE: 1.17 mg/dL (ref 0.61–1.24)
Calcium: 8.8 mg/dL — ABNORMAL LOW (ref 8.9–10.3)
GFR calc Af Amer: >60 mL/min (ref >60)
GFR calc non Af Amer: 58 mL/min — ABNORMAL LOW (ref >60)
GLUCOSE: 127 mg/dL — AB (ref 70–99)
Potassium: 4 mmol/L (ref 3.5–5.1)
SODIUM: 135 mmol/L (ref 135–145)

## 2018-03-28 LAB — GLUCOSE, CAPILLARY
GLUCOSE-CAPILLARY: 117 mg/dL — AB (ref 70–99)
GLUCOSE-CAPILLARY: 107 mg/dL — AB (ref 70–99)
GLUCOSE-CAPILLARY: 100 mg/dL — AB (ref 70–99)
GLUCOSE-CAPILLARY: 98 mg/dL (ref 70–99)
Glucose-Capillary: 166 mg/dL — ABNORMAL HIGH (ref 70–99)

## 2018-03-28 LAB — CBC WITH DIFFERENTIAL/PLATELET
Basophils Absolute: 0 10*3/uL (ref 0–0.1)
Basophils Relative: 0 %
EOS PCT: 4 %
Eosinophils Absolute: 0.5 10*3/uL (ref 0–0.7)
HCT: 32.5 % — ABNORMAL LOW (ref 40.0–52.0)
Hemoglobin: 11.2 g/dL — ABNORMAL LOW (ref 13.0–18.0)
LYMPHS ABS: 2.8 10*3/uL (ref 1.0–3.6)
Lymphocytes Relative: 19 %
MCH: 30.7 pg (ref 26.0–34.0)
MCHC: 34.4 g/dL (ref 32.0–36.0)
MCV: 89.2 fL (ref 80.0–100.0)
MONO ABS: 1.2 10*3/uL — AB (ref 0.2–1.0)
MONOS PCT: 8 %
Neutro Abs: 10 10*3/uL — ABNORMAL HIGH (ref 1.4–6.5)
Neutrophils Relative %: 69 %
PLATELETS: 283 10*3/uL (ref 150–440)
RBC: 3.65 MIL/uL — ABNORMAL LOW (ref 4.40–5.90)
RDW: 13.2 % (ref 11.5–14.5)
WBC: 14.5 10*3/uL — ABNORMAL HIGH (ref 3.8–10.6)

## 2018-03-28 SURGERY — AMPUTATION, TOE
Anesthesia: General | Laterality: Left

## 2018-03-28 MED ORDER — LACTATED RINGERS IV SOLN
INTRAVENOUS | Status: DC | PRN
  Administered 2018-03-28: 19:00:00 via INTRAVENOUS

## 2018-03-28 MED ORDER — FENTANYL CITRATE (PF) 100 MCG/2ML IJ SOLN
INTRAMUSCULAR | Status: AC
  Filled 2018-03-28: qty 2

## 2018-03-28 MED ORDER — PROPOFOL 10 MG/ML IV BOLUS
INTRAVENOUS | Status: AC
Start: 2018-03-28 — End: ?
  Filled 2018-03-28: qty 20

## 2018-03-28 MED ORDER — PROPOFOL 10 MG/ML IV BOLUS
INTRAVENOUS | Status: DC | PRN
  Administered 2018-03-28: 100 mg via INTRAVENOUS

## 2018-03-28 MED ORDER — PROPOFOL 10 MG/ML IV BOLUS
INTRAVENOUS | Status: AC
  Filled 2018-03-28: qty 40

## 2018-03-28 MED ORDER — VANCOMYCIN HCL 1000 MG IV SOLR
INTRAVENOUS | Status: AC
  Filled 2018-03-28: qty 1000

## 2018-03-28 MED ORDER — LIDOCAINE HCL (PF) 2 % IJ SOLN
INTRAMUSCULAR | Status: AC
  Filled 2018-03-28: qty 10

## 2018-03-28 MED ORDER — NEOMYCIN-POLYMYXIN B GU 40-200000 IR SOLN
Status: AC
  Filled 2018-03-28: qty 2

## 2018-03-28 MED ORDER — PHENYLEPHRINE HCL 10 MG/ML IJ SOLN
INTRAMUSCULAR | Status: DC | PRN
  Administered 2018-03-28 (×2): 100 ug via INTRAVENOUS

## 2018-03-28 MED ORDER — ONDANSETRON HCL 4 MG/2ML IJ SOLN: 4.0000 mg | Freq: Once | INTRAMUSCULAR | Status: DC | PRN

## 2018-03-28 MED ORDER — ONDANSETRON HCL 4 MG/2ML IJ SOLN
INTRAMUSCULAR | Status: DC | PRN
  Administered 2018-03-28: 4 mg via INTRAVENOUS

## 2018-03-28 MED ORDER — LIDOCAINE HCL (PF) 1 % IJ SOLN
INTRAMUSCULAR | Status: AC
  Filled 2018-03-28: qty 30

## 2018-03-28 MED ORDER — VANCOMYCIN HCL 1000 MG IV SOLR
INTRAVENOUS | Status: DC | PRN
  Administered 2018-03-28: 1000 mg

## 2018-03-28 MED ORDER — NEOMYCIN-POLYMYXIN B GU 40-200000 IR SOLN
Status: DC | PRN
  Administered 2018-03-28: 2 mL

## 2018-03-28 MED ORDER — FENTANYL CITRATE (PF) 100 MCG/2ML IJ SOLN
INTRAMUSCULAR | Status: DC | PRN
  Administered 2018-03-28: 50 ug via INTRAVENOUS

## 2018-03-28 MED ORDER — BUPIVACAINE HCL 0.5 % IJ SOLN
INTRAMUSCULAR | Status: DC | PRN
  Administered 2018-03-28: 10 mL

## 2018-03-28 MED ORDER — BUPIVACAINE HCL (PF) 0.5 % IJ SOLN
INTRAMUSCULAR | Status: AC
  Filled 2018-03-28: qty 30

## 2018-03-28 MED ORDER — FENTANYL CITRATE (PF) 100 MCG/2ML IJ SOLN: 25.0000 ug | INTRAMUSCULAR | Status: DC | PRN

## 2018-03-28 MED ORDER — ONDANSETRON HCL 4 MG/2ML IJ SOLN
INTRAMUSCULAR | Status: AC
  Filled 2018-03-28: qty 2

## 2018-03-28 MED ORDER — LIDOCAINE HCL (CARDIAC) PF 100 MG/5ML IV SOSY
PREFILLED_SYRINGE | INTRAVENOUS | Status: DC | PRN
  Administered 2018-03-28: 80 mg via INTRAVENOUS

## 2018-03-28 SURGICAL SUPPLY — 50 items
BANDAGE ACE 4X5 VEL STRL LF (GAUZE/BANDAGES/DRESSINGS) ×3 IMPLANT
BLADE MED AGGRESSIVE (BLADE) ×1 IMPLANT
BLADE OSC/SAGITTAL MD 5.5X18 (BLADE) ×3 IMPLANT
BLADE SURG 15 STRL LF DISP TIS (BLADE) ×2 IMPLANT
BLADE SURG 15 STRL SS (BLADE) ×4
BLADE SURG MINI STRL (BLADE) ×3 IMPLANT
BNDG ESMARK 4X12 TAN STRL LF (GAUZE/BANDAGES/DRESSINGS) ×1 IMPLANT
BNDG GAUZE 4.5X4.1 6PLY STRL (MISCELLANEOUS) ×5 IMPLANT
CANISTER SUCT 1200ML W/VALVE (MISCELLANEOUS) ×3 IMPLANT
CANISTER SUCT 3000ML (MISCELLANEOUS) ×2 IMPLANT
CLOSURE WOUND 1/4X4 (GAUZE/BANDAGES/DRESSINGS)
CUFF TOURN 18 STER (MISCELLANEOUS) ×3 IMPLANT
CUFF TOURN DUAL PL 12 NO SLV (MISCELLANEOUS) ×1 IMPLANT
DRAPE FLUOR MINI C-ARM 54X84 (DRAPES) ×1 IMPLANT
DURAPREP 26ML APPLICATOR (WOUND CARE) ×3 IMPLANT
ELECT REM PT RETURN 9FT ADLT (ELECTROSURGICAL) ×3
ELECTRODE REM PT RTRN 9FT ADLT (ELECTROSURGICAL) ×1 IMPLANT
GAUZE PETRO XEROFOAM 1X8 (MISCELLANEOUS) ×3 IMPLANT
GAUZE PETROLATUM 1 X8 (GAUZE/BANDAGES/DRESSINGS) ×2 IMPLANT
GAUZE SPONGE 4X4 12PLY STRL (GAUZE/BANDAGES/DRESSINGS) ×3 IMPLANT
GAUZE STRETCH 2X75IN STRL (MISCELLANEOUS) ×1 IMPLANT
GLOVE BIO SURGEON STRL SZ7.5 (GLOVE) ×5 IMPLANT
GLOVE INDICATOR 8.0 STRL GRN (GLOVE) ×5 IMPLANT
GOWN STRL REUS W/ TWL LRG LVL3 (GOWN DISPOSABLE) ×2 IMPLANT
GOWN STRL REUS W/TWL LRG LVL3 (GOWN DISPOSABLE) ×4
HANDPIECE VERSAJET DEBRIDEMENT (MISCELLANEOUS) ×3 IMPLANT
KIT STIMULAN RAPID CURE 5CC (Orthopedic Implant) ×2 IMPLANT
KIT TURNOVER KIT A (KITS) ×3 IMPLANT
LABEL OR SOLS (LABEL) ×3 IMPLANT
NDL FILTER BLUNT 18X1 1/2 (NEEDLE) ×1 IMPLANT
NDL HYPO 25X1 1.5 SAFETY (NEEDLE) ×2 IMPLANT
NEEDLE FILTER BLUNT 18X 1/2SAF (NEEDLE) ×4
NEEDLE FILTER BLUNT 18X1 1/2 (NEEDLE) ×2 IMPLANT
NEEDLE HYPO 25X1 1.5 SAFETY (NEEDLE) ×6 IMPLANT
NS IRRIG 500ML POUR BTL (IV SOLUTION) ×3 IMPLANT
PACK EXTREMITY ARMC (MISCELLANEOUS) ×3 IMPLANT
PAD ABD DERMACEA PRESS 5X9 (GAUZE/BANDAGES/DRESSINGS) ×2 IMPLANT
SOL .9 NS 3000ML IRR  AL (IV SOLUTION) ×2
SOL .9 NS 3000ML IRR UROMATIC (IV SOLUTION) ×1 IMPLANT
SOL PREP PVP 2OZ (MISCELLANEOUS) ×3
SOLUTION PREP PVP 2OZ (MISCELLANEOUS) ×1 IMPLANT
STOCKINETTE STRL 6IN 960660 (GAUZE/BANDAGES/DRESSINGS) ×3 IMPLANT
STRIP CLOSURE SKIN 1/4X4 (GAUZE/BANDAGES/DRESSINGS) ×1 IMPLANT
SUT ETHILON 3-0 FS-10 30 BLK (SUTURE) ×3
SUT ETHILON 4-0 (SUTURE)
SUT ETHILON 4-0 FS2 18XMFL BLK (SUTURE)
SUTURE EHLN 3-0 FS-10 30 BLK (SUTURE) ×1 IMPLANT
SUTURE ETHLN 4-0 FS2 18XMF BLK (SUTURE) IMPLANT
SWAB DUAL CULTURE TRANS RED ST (MISCELLANEOUS) ×3 IMPLANT
SYR 10ML LL (SYRINGE) ×5 IMPLANT

## 2018-03-28 NOTE — H&P (View-Only) (Signed)
2 Days Post-Op   Subjective/Chief Complaint: Patient seen.  States that the foot feels a little bit better today.   Objective: Vital signs in last 24 hours: Temp:  [98.2 F (36.8 C)-99.8 F (37.7 C)] 98.2 F (36.8 C) (07/12 0802) Pulse Rate:  [73-80] 79 (07/12 0802) Resp:  [18-19] 18 (07/12 0802) BP: (121-155)/(56-70) 133/59 (07/12 0802) SpO2:  [96 %-98 %] 97 % (07/12 0802) Last BM Date: 03/25/18  Intake/Output from previous day: 07/11 0701 - 07/12 0700 In: 2048.6 [P.O.:360; I.V.:1175; IV Piggyback:513.6] Out: 550 [Urine:550] Intake/Output this shift: Total I/O In: 0  Out: 200 [Urine:200]  Significant increase in the cyanotic appearance of the left second toe and the third toe also appears dusky.  MRI did not show any clear osteomyelitis or drainable abscess but white count is still significantly elevated.  Lab Results:  Recent Labs    03/28/18 0421  WBC 14.5*  HGB 11.2*  HCT 32.5*  PLT 283   BMET Recent Labs    03/26/18 0519 03/28/18 0421  NA 135 135  K 4.2 4.0  CL 104 102  CO2 24 23  GLUCOSE 122* 127*  BUN 19 18  CREATININE 1.06 1.17  CALCIUM 8.8* 8.8*   PT/INR No results for input(s): LABPROT, INR in the last 72 hours. ABG No results for input(s): PHART, HCO3 in the last 72 hours.  Invalid input(s): PCO2, PO2  Studies/Results: Mr Foot Left Wo Contrast  Result Date: 03/28/2018 CLINICAL DATA:  77 year old male recently diagnosed with diabetes who relates a several week history of a sore on his left foot. Thinks that he may have stepped on something. The area continues to have worsened and recently developed some significant redness and swelling in the left foot. Recently had some fever and chills as well as loss of appetite and presented to the emergency department where he was admitted with a diabetic foot infection. EXAM: MRI OF THE LEFT FOOT WITHOUT CONTRAST TECHNIQUE: Multiplanar, multisequence MR imaging of the left foot was performed. No  intravenous contrast was administered. COMPARISON:  None. FINDINGS: Patient motion degrades image quality limiting evaluation. Bones/Joint/Cartilage No marrow signal abnormality. No fracture or dislocation. Normal alignment. No joint effusion. 5 mm low signal focus in the dorsal aspect of the second MTP joint which may reflect dystrophic calcification versus loose body. Ligaments Collateral ligaments are intact.  Lisfranc ligament is intact. Muscles and Tendons Flexor, peroneal and extensor compartment tendons are intact. T2 hyperintense signal throughout the plantar musculature likely neurogenic. Soft tissue Soft tissue wound along the plantar aspect of the forefoot between the first and second MTP joints with severe surrounding soft tissue edema. Soft tissue edema extends through the first webspace into the dorsal aspect of the second metatarsal. No drainable fluid collection. IMPRESSION: 1. No evidence of osteomyelitis of the left forefoot. 2. Soft tissue wound along the plantar aspect of the forefoot between the first and second MTP joints. Surrounding cellulitis extending into the webspace and along the dorsal aspect of the second MTP joint. Electronically Signed   By: Elige Ko   On: 03/28/2018 10:04    Anti-infectives: Anti-infectives (From admission, onward)   Start     Dose/Rate Route Frequency Ordered Stop   03/28/18 0600  vancomycin (VANCOCIN) 1,250 mg in sodium chloride 0.9 % 250 mL IVPB     1,250 mg 166.7 mL/hr over 90 Minutes Intravenous Every 18 hours 03/27/18 1227     03/26/18 1259  ceFAZolin (ANCEF) 2-4 GM/100ML-% IVPB    Note  to Pharmacy:  Littie DeedsJarrett, Catherine  : cabinet override      03/26/18 1259 03/27/18 0114   03/25/18 1200  vancomycin (VANCOCIN) 1,250 mg in sodium chloride 0.9 % 250 mL IVPB  Status:  Discontinued     1,250 mg 166.7 mL/hr over 90 Minutes Intravenous Every 24 hours 03/24/18 2058 03/27/18 1227   03/24/18 2200  piperacillin-tazobactam (ZOSYN) IVPB 3.375 g  Status:   Discontinued     3.375 g 12.5 mL/hr over 240 Minutes Intravenous Every 8 hours 03/24/18 2050 03/28/18 1013   03/24/18 2145  piperacillin-tazobactam (ZOSYN) IVPB 3.375 g  Status:  Discontinued     3.375 g 100 mL/hr over 30 Minutes Intravenous  Once 03/24/18 2134 03/24/18 2136   03/24/18 2145  vancomycin (VANCOCIN) IVPB 1000 mg/200 mL premix  Status:  Discontinued     1,000 mg 200 mL/hr over 60 Minutes Intravenous  Once 03/24/18 2134 03/24/18 2136   03/24/18 2030  vancomycin (VANCOCIN) 1,750 mg in sodium chloride 0.9 % 500 mL IVPB     1,750 mg 250 mL/hr over 120 Minutes Intravenous  Once 03/24/18 1953 03/24/18 2132      Assessment/Plan: s/p Procedure(s): Lower Extremity Angiography (Left) 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   Plan: Discussed with the patient and his wife the need for amputation of the toes with possible resection of some of the foot bones as well.  Discussed that with his tissue loss that we would not be able to close this primarily and he will need local wound care for an extended period of time.  Discussed possible risks and complications of the procedure including inability to heal due to his diabetes or peripheral vascular disease or continued infection which may require further debridement and/or amputation.  Questions invited and answered.  Patient will be n.p.o. now.  Consent form for amputation left second and third toes with partial ray resections.  At this point we will plan for surgery later this evening.  LOS: 4 days    Ricci Barkerodd W Helyn Schwan 03/28/2018

## 2018-03-28 NOTE — Anesthesia Post-op Follow-up Note (Signed)
Anesthesia QCDR form completed.        

## 2018-03-28 NOTE — Anesthesia Preprocedure Evaluation (Signed)
Anesthesia Evaluation  Patient identified by MRN, date of birth, ID band Patient awake    Reviewed: Allergy & Precautions, H&P , NPO status , Patient's Chart, lab work & pertinent test results, reviewed documented beta blocker date and time   History of Anesthesia Complications Negative for: history of anesthetic complications  Airway Mallampati: I  TM Distance: >3 FB Neck ROM: full    Dental  (+) Dental Advidsory Given, Edentulous Upper, Edentulous Lower, Upper Dentures, Lower Dentures   Pulmonary neg pulmonary ROS, former smoker,           Cardiovascular Exercise Tolerance: Good hypertension, (-) angina(-) CAD, (-) Past MI, (-) Cardiac Stents and (-) CABG (-) dysrhythmias (-) Valvular Problems/Murmurs     Neuro/Psych negative neurological ROS  negative psych ROS   GI/Hepatic negative GI ROS, Neg liver ROS,   Endo/Other  diabetes  Renal/GU negative Renal ROS  negative genitourinary   Musculoskeletal   Abdominal   Peds  Hematology negative hematology ROS (+)   Anesthesia Other Findings Past Medical History: No date: Diabetes mellitus without complication (HCC)   Reproductive/Obstetrics negative OB ROS                             Anesthesia Physical Anesthesia Plan  ASA: III  Anesthesia Plan: General   Post-op Pain Management:    Induction: Intravenous  PONV Risk Score and Plan: 2 and Ondansetron and Dexamethasone  Airway Management Planned: LMA  Additional Equipment:   Intra-op Plan:   Post-operative Plan: Extubation in OR  Informed Consent: I have reviewed the patients History and Physical, chart, labs and discussed the procedure including the risks, benefits and alternatives for the proposed anesthesia with the patient or authorized representative who has indicated his/her understanding and acceptance.   Dental Advisory Given  Plan Discussed with: Anesthesiologist, CRNA  and Surgeon  Anesthesia Plan Comments:         Anesthesia Quick Evaluation

## 2018-03-28 NOTE — Progress Notes (Signed)
[facility] [place] [facility]   PATIENT NAME: [name]    MR#:  [id number]  DATE OF BIRTH:  [name]  SUBJECTIVE:  CHIEF COMPLAINT:   Chief Complaint  Patient presents with  . Foot Pain   -left foot second toe is purple and cyanotic. MRI with no osteomyelitis. -Continue monitoring for improvement  REVIEW OF SYSTEMS:  Review of Systems  Constitutional: Negative for chills, fever and malaise/fatigue.  HENT: Negative for congestion, hearing loss and nosebleeds.   Eyes: Negative for blurred vision and double vision.  Respiratory: Negative for cough, shortness of breath and wheezing.   Cardiovascular: Negative for chest pain and palpitations.  Gastrointestinal: Negative for abdominal pain, constipation, diarrhea, nausea and vomiting.  Genitourinary: Negative for dysuria.  Musculoskeletal: Positive for joint pain.  Neurological: Negative for dizziness, focal weakness, seizures, weakness and headaches.  Psychiatric/Behavioral: Negative for depression.    DRUG ALLERGIES:   Allergies  Allergen Reactions  . Flu Virus Vaccine Other (See Comments) and Nausea And Vomiting    Fever, chills, vomiting.     VITALS:  Blood pressure (!) 133/59, pulse 79, temperature 98.2 F (36.8 C), temperature source Oral, resp. rate 18, height 5\' 9"  (1.753 m), weight 79.4 kg (175 lb), SpO2 97 %.  PHYSICAL EXAMINATION:  Physical Exam  GENERAL:  77 y.o.-year-old patient lying in the bed with no acute distress.  EYES: Pupils equal, round, reactive to light and accommodation. No scleral icterus. Extraocular muscles intact.  HEENT: Head atraumatic, normocephalic. Oropharynx and nasopharynx clear.  NECK:  Supple, no jugular venous distention. No thyroid enlargement, no tenderness.  LUNGS: Normal breath sounds bilaterally, no wheezing, rales,rhonchi or crepitation. No use of accessory muscles of respiration.  CARDIOVASCULAR: S1, S2 normal. No murmurs, rubs, or gallops.    ABDOMEN: Soft, nontender, nondistended. Bowel sounds present. No organomegaly or mass.  EXTREMITIES: Feeble pedal pulses, left foot in dressing. Redness on the dorsum is improving, purple second toe, pale third toe, dark necrotic skin around the second toe.  No pedal edema, cyanosis, or clubbing.  NEUROLOGIC: Cranial nerves II through XII are intact. Muscle strength 5/5 in all extremities. Sensation intact. Gait not checked.  PSYCHIATRIC: The patient is alert and oriented x 3.  SKIN: No obvious rash, lesion, or ulcer.    LABORATORY PANEL:   CBC Recent Labs  Lab 03/24/18 1651  WBC 15.6*  HGB 12.3*  HCT 36.1*  PLT 298   ------------------------------------------------------------------------------------------------------------------  Chemistries  Recent Labs  Lab 03/24/18 1651  03/28/18 0421  NA 132*   < > 135  K 4.1   < > 4.0  CL 97*   < > 102  CO2 23   < > 23  GLUCOSE 165*   < > 127*  BUN 32*   < > 18  CREATININE 1.34*   < > 1.17  CALCIUM 9.3   < > 8.8*  AST 22  --   --   ALT 19  --   --   ALKPHOS 55  --   --   BILITOT 0.6  --   --    < > = values in this interval not displayed.   ------------------------------------------------------------------------------------------------------------------  Cardiac Enzymes No results for input(s): TROPONINI in the last 168 hours. ------------------------------------------------------------------------------------------------------------------  RADIOLOGY:  Mr Foot Left Wo Contrast  Result Date: 03/28/2018 CLINICAL DATA:  77 year old male recently diagnosed with diabetes who relates a several week history of a sore on his left foot. Thinks that he may have  stepped on something. The area continues to have worsened and recently developed some significant redness and swelling in the left foot. Recently had some fever and chills as well as loss of appetite and presented to the emergency department where he was admitted with a diabetic  foot infection. EXAM: MRI OF THE LEFT FOOT WITHOUT CONTRAST TECHNIQUE: Multiplanar, multisequence MR imaging of the left foot was performed. No intravenous contrast was administered. COMPARISON:  None. FINDINGS: Patient motion degrades image quality limiting evaluation. Bones/Joint/Cartilage No marrow signal abnormality. No fracture or dislocation. Normal alignment. No joint effusion. 5 mm low signal focus in the dorsal aspect of the second MTP joint which may reflect dystrophic calcification versus loose body. Ligaments Collateral ligaments are intact.  Lisfranc ligament is intact. Muscles and Tendons Flexor, peroneal and extensor compartment tendons are intact. T2 hyperintense signal throughout the plantar musculature likely neurogenic. Soft tissue Soft tissue wound along the plantar aspect of the forefoot between the first and second MTP joints with severe surrounding soft tissue edema. Soft tissue edema extends through the first webspace into the dorsal aspect of the second metatarsal. No drainable fluid collection. IMPRESSION: 1. No evidence of osteomyelitis of the left forefoot. 2. Soft tissue wound along the plantar aspect of the forefoot between the first and second MTP joints. Surrounding cellulitis extending into the webspace and along the dorsal aspect of the second MTP joint. Electronically Signed   By: Elige Ko   On: 03/28/2018 10:04    EKG:  No orders found for this or any previous visit.  ASSESSMENT AND PLAN:   77 year old male with past medical history significant for hypertension and diabetes presents to hospital secondary to left foot cellulitis and abscess  1.  Diabetic foot infection with left foot cellulitis and abscess-appreciate podiatry consult -Patient had bedside I&D done.  Cultures growing gram-positive cocci and gram variable rods-final cultures are pending. -Discontinue Zosyn. Continue vancomycin. -MRI negative for osteomyelitis -Status post angiogram of the left leg  and atherectomy and angioplasty done.  Appreciate vascular consult-on Plavix for the same -Postop boot available, ambulate today.  -Monitor for cyanosis to improve in the left foot second and third toes-if no improvement, podiatry recommends amputation.  2.  Acute renal failure-lisinopril and metformin on hold. Improved with IV fluids.  3.  Diabetes mellitus-sliding scale insulin for now.  A1c is 7.2  4.  Hypertension-  on Norvasc  5.  DVT prophylaxis-subcutaneous heparin  Patient is independent at baseline.   PT consult today    All the records are reviewed and case discussed with Care Management/Social Workerr. Management plans discussed with the patient, family and they are in agreement.  CODE STATUS: Full code  TOTAL TIME TAKING CARE OF THIS PATIENT: 36 minutes.   POSSIBLE D/C 1-2 days, DEPENDING ON CLINICAL CONDITION.   Enid Baas M.D on 03/28/2018 at 10:13 AM  Between 7am to 6pm - Pager - [phone]  After 6pm go to [email or web address] - Social research officer, government  [facility]  Office  [phone]  CC: Primary care physician; Whitaker, [employer], PA-C

## 2018-03-28 NOTE — Op Note (Signed)
Date of operation: 03/28/2018.  Surgeon: Ricci Barkerodd W. Thirza Pellicano D.P.M.  Preoperative diagnosis: Gangrene left second and third toes.  Postoperative diagnosis: Same with significant forefoot abscess.  Procedure: Amputation left second and third toes with partial ray resections.  Anesthesia: LMA with local.  Hemostasis: None.  Estimated blood loss: Less than 5 cc.  Cultures: Deep wound cultures left foot, surgical.  Implants: Stimulan rapid cure antibiotic beads impregnated with vancomycin.  Complications: None apparent other than minimal bleeding.  Operative indications: This is a 77 year old male recently diagnosed with diabetes admitted for infection in his left forefoot.  Underwent revascularization to the left foot with extensive vascular disease.  Progressive gangrenous changes to the second and third toes and decision was made for amputation with ray resection.  Operative procedure: Patient was taken to the operating room and placed on the table in the supine position.  Following satisfactory LMA anesthesia the left forefoot was anesthetized with 10 cc of 0.5% bupivacaine plain around the forefoot.  The foot was then prepped and draped in the usual sterile fashion.  Attention was directed to the distal left forefoot where the devitalized eschar around the first interspace and second and third toes was sharply incised at the margin of the viable skin.  Upon incision there was significant purulence encountered.  An incision was then made dorsally around the lateral base of the third toe ending plantarly at the area of necrosis.  The incision was carried sharply down to the level of the bone and periosteal dissection carried along the proximal phalanx and third metatarsal.  Soft tissue freed from around the second and third metatarsal and the second and third metatarsals were incised using a sagittal saw and of the second and third toes with heads of the responding metatarsals removed in toto.  Again  significant purulent and necrotic tissue was noted throughout the plantar aspect of the wound extending onto the first metatarsal area.  All remaining devitalized necrotic tissue was then debrided with a versa jet debrider on a setting of 4 down to the level of capsule and bone at the first second and third metatarsals.  The wound was then thoroughly irrigated with the versa jet on a setting of 2.  The wound was then irrigated using sterile saline with a bulb syringe.  The dorsal and plantar aspects of the wound were then reapproximated using 3-0 nylon simple interrupted sutures with the central aspect of the wound and the first interspace left open as this could not be closed.  Stimulan rapid cure antibiotic beads were then packed within the wound.  Xeroform 4 x 4's ABD and Kerlix were then applied to the left foot followed by a second Kerlix and Ace wrap.  Patient was awakened and taken to the PACU with vital signs stable in good condition.

## 2018-03-28 NOTE — Transfer of Care (Signed)
Immediate Anesthesia Transfer of Care Note  Patient: [name]  Procedure(s) Performed: AMPUTATION TOE/ PARTIAL RAY RESECTION-LEFT 2ND AND 3RD (Left )  Patient Location: PACU  Anesthesia Type:General  Level of Consciousness: awake  Airway & Oxygen Therapy: Patient Spontanous Breathing and Patient connected to nasal cannula oxygen  Post-op Assessment: Report given to RN and Post -op Vital signs reviewed and stable  Post vital signs: Reviewed and stable  Last Vitals:  Vitals Value Taken Time  BP    Temp    Pulse    Resp    SpO2      Last Pain:  Vitals:   03/28/18 1632  TempSrc: Oral  PainSc:       Patients Stated Pain Goal: 1 (03/26/18 1208)  Complications: No apparent anesthesia complications

## 2018-03-28 NOTE — Progress Notes (Signed)
2 Days Post-Op   Subjective/Chief Complaint: Patient seen.  States that the foot feels a little bit better today.   Objective: Vital signs in last 24 hours: Temp:  [98.2 F (36.8 C)-99.8 F (37.7 C)] 98.2 F (36.8 C) (07/12 0802) Pulse Rate:  [73-80] 79 (07/12 0802) Resp:  [18-19] 18 (07/12 0802) BP: (121-155)/(56-70) 133/59 (07/12 0802) SpO2:  [96 %-98 %] 97 % (07/12 0802) Last BM Date: 03/25/18  Intake/Output from previous day: 07/11 0701 - 07/12 0700 In: 2048.6 [P.O.:360; I.V.:1175; IV Piggyback:513.6] Out: 550 [Urine:550] Intake/Output this shift: Total I/O In: 0  Out: 200 [Urine:200]  Significant increase in the cyanotic appearance of the left second toe and the third toe also appears dusky.  MRI did not show any clear osteomyelitis or drainable abscess but white count is still significantly elevated.  Lab Results:  Recent Labs    03/28/18 0421  WBC 14.5*  HGB 11.2*  HCT 32.5*  PLT 283   BMET Recent Labs    03/26/18 0519 03/28/18 0421  NA 135 135  K 4.2 4.0  CL 104 102  CO2 24 23  GLUCOSE 122* 127*  BUN 19 18  CREATININE 1.06 1.17  CALCIUM 8.8* 8.8*   PT/INR No results for input(s): LABPROT, INR in the last 72 hours. ABG No results for input(s): PHART, HCO3 in the last 72 hours.  Invalid input(s): PCO2, PO2  Studies/Results: Mr Foot Left Wo Contrast  Result Date: 03/28/2018 CLINICAL DATA:  77-year-old male recently diagnosed with diabetes who relates a several week history of a sore on his left foot. Thinks that he may have stepped on something. The area continues to have worsened and recently developed some significant redness and swelling in the left foot. Recently had some fever and chills as well as loss of appetite and presented to the emergency department where he was admitted with a diabetic foot infection. EXAM: MRI OF THE LEFT FOOT WITHOUT CONTRAST TECHNIQUE: Multiplanar, multisequence MR imaging of the left foot was performed. No  intravenous contrast was administered. COMPARISON:  None. FINDINGS: Patient motion degrades image quality limiting evaluation. Bones/Joint/Cartilage No marrow signal abnormality. No fracture or dislocation. Normal alignment. No joint effusion. 5 mm low signal focus in the dorsal aspect of the second MTP joint which may reflect dystrophic calcification versus loose body. Ligaments Collateral ligaments are intact.  Lisfranc ligament is intact. Muscles and Tendons Flexor, peroneal and extensor compartment tendons are intact. T2 hyperintense signal throughout the plantar musculature likely neurogenic. Soft tissue Soft tissue wound along the plantar aspect of the forefoot between the first and second MTP joints with severe surrounding soft tissue edema. Soft tissue edema extends through the first webspace into the dorsal aspect of the second metatarsal. No drainable fluid collection. IMPRESSION: 1. No evidence of osteomyelitis of the left forefoot. 2. Soft tissue wound along the plantar aspect of the forefoot between the first and second MTP joints. Surrounding cellulitis extending into the webspace and along the dorsal aspect of the second MTP joint. Electronically Signed   By: Hetal  Patel   On: 03/28/2018 10:04    Anti-infectives: Anti-infectives (From admission, onward)   Start     Dose/Rate Route Frequency Ordered Stop   03/28/18 0600  vancomycin (VANCOCIN) 1,250 mg in sodium chloride 0.9 % 250 mL IVPB     1,250 mg 166.7 mL/hr over 90 Minutes Intravenous Every 18 hours 03/27/18 1227     03/26/18 1259  ceFAZolin (ANCEF) 2-4 GM/100ML-% IVPB    Note   to Pharmacy:  Jarrett, Catherine  : cabinet override      03/26/18 1259 03/27/18 0114   03/25/18 1200  vancomycin (VANCOCIN) 1,250 mg in sodium chloride 0.9 % 250 mL IVPB  Status:  Discontinued     1,250 mg 166.7 mL/hr over 90 Minutes Intravenous Every 24 hours 03/24/18 2058 03/27/18 1227   03/24/18 2200  piperacillin-tazobactam (ZOSYN) IVPB 3.375 g  Status:   Discontinued     3.375 g 12.5 mL/hr over 240 Minutes Intravenous Every 8 hours 03/24/18 2050 03/28/18 1013   03/24/18 2145  piperacillin-tazobactam (ZOSYN) IVPB 3.375 g  Status:  Discontinued     3.375 g 100 mL/hr over 30 Minutes Intravenous  Once 03/24/18 2134 03/24/18 2136   03/24/18 2145  vancomycin (VANCOCIN) IVPB 1000 mg/200 mL premix  Status:  Discontinued     1,000 mg 200 mL/hr over 60 Minutes Intravenous  Once 03/24/18 2134 03/24/18 2136   03/24/18 2030  vancomycin (VANCOCIN) 1,750 mg in sodium chloride 0.9 % 500 mL IVPB     1,750 mg 250 mL/hr over 120 Minutes Intravenous  Once 03/24/18 1953 03/24/18 2132      Assessment/Plan: s/p Procedure(s): Lower Extremity Angiography (Left) 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   Plan: Discussed with the patient and his wife the need for amputation of the toes with possible resection of some of the foot bones as well.  Discussed that with his tissue loss that we would not be able to close this primarily and he will need local wound care for an extended period of time.  Discussed possible risks and complications of the procedure including inability to heal due to his diabetes or peripheral vascular disease or continued infection which may require further debridement and/or amputation.  Questions invited and answered.  Patient will be n.p.o. now.  Consent form for amputation left second and third toes with partial ray resections.  At this point we will plan for surgery later this evening.  LOS: 4 days    Carnisha Feltz W Aubriel Khanna 03/28/2018 

## 2018-03-28 NOTE — Interval H&P Note (Signed)
History and Physical Interval Note:  03/28/2018 6:10 PM  [name]  has presented today for surgery, with the diagnosis of N/A  The various methods of treatment have been discussed with the patient and family. After consideration of risks, benefits and other options for treatment, the patient has consented to  Procedure(s): AMPUTATION TOE/ PARTIAL RAY RESECTION-LEFT 2ND AND 3RD (Left) as a surgical intervention .  The patient's history has been reviewed, patient examined, no change in status, stable for surgery.  I have reviewed the patient's chart and labs.  Questions were answered to the patient's satisfaction.     Ricci Barkerodd W Dolores Ewing

## 2018-03-28 NOTE — Evaluation (Signed)
Physical Therapy Evaluation Patient Details Name: [name] MRN: [id number] DOB: 1940-10-23 Today's Date: 03/28/2018   History of Present Illness   77 y.o. male who was recently diagnosed with diabetes in June 2019, presenting with left foot erythema, drainage, warmth and pain. Status post left L LE angiogram with atherectomy and angioplasty 7/10.  Clinical Impression  Pt did well with PT exam and was able to ambulate with walker and maintain PWBing with post-op boot donned.  He showed good confidence and safety.  He did struggle with sit to stand and in addition to exam PT provided ~10 minutes of education for sit to stand, use of AD/boot and general safety concerns for returning home.  Pt did well and per today's performance would not likely need PT f/u on d/c, he is slated to have additional sx this evening and further assessment may be necessary at that time to assess further need.    Follow Up Recommendations Follow surgeon's recommendation for DC plan and follow-up therapies(per status after further surgeries)    Equipment Recommendations  None recommended by PT    Recommendations for Other Services       Precautions / Restrictions Precautions Precautions: Fall Required Braces or Orthoses: (post-op shoe) Restrictions LLE Weight Bearing: Partial weight bearing LLE Partial Weight Bearing Percentage or Pounds: 50% with post-op boot donned      Mobility  Bed Mobility Overal bed mobility: Independent             General bed mobility comments: Pt able to get to sitting EOB w/o rails and w/o assist  Transfers Overall transfer level: Modified independent Equipment used: Rolling walker (2 wheeled)             General transfer comment: Pt struggled to rise the first 2 attempts, with re-positioning of feet/UEs he was able to rise with some struggle  Ambulation/Gait Ambulation/Gait assistance: Supervision Gait Distance (Feet): 35 Feet Assistive device: Rolling  walker (2 wheeled)       General Gait Details: Pt was able to maintain PWBing safely and does endorse the importance of using RW to insure he does not put too much weight on the L LE.  He showed good confidence and likely could have done more but we did not want to over-do-it prior to another surgery this evening.  Stairs            Wheelchair Mobility    Modified Rankin (Stroke Patients Only)       Balance Overall balance assessment: Modified Independent(reliant on walker to maintain PWBing, overall safe)                                           Pertinent Vitals/Pain Pain Location: reports only minimal pain in L LE, not pain limited    Home Living Family/patient expects to be discharged to:: Private residence Living Arrangements: Spouse/significant other Available Help at Discharge: Family Type of Home: Mobile home Home Access: Ramped entrance     Home Layout: One level Home Equipment: Environmental consultantWalker - 2 wheels      Prior Function Level of Independence: Independent         Comments: 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, works outside, drives, Psychiatric nurseetc     Hand Dominance        Extremity/Trunk Assessment   Upper Extremity Assessment Upper Extremity Assessment: Overall WFL for tasks assessed  Lower Extremity Assessment Lower Extremity Assessment: Overall WFL for tasks assessed(deferred distal L LE resisted testing)       Communication   Communication: No difficulties  Cognition Arousal/Alertness: Awake/alert Behavior During Therapy: WFL for tasks assessed/performed Overall Cognitive Status: Within Functional Limits for tasks assessed                                        General Comments      Exercises     Assessment/Plan    PT Assessment Patient needs continued PT services  PT Problem List Decreased strength;Decreased range of motion;Decreased activity tolerance;Decreased balance;Decreased mobility;Decreased  coordination;Decreased safety awareness;Decreased knowledge of use of DME;Decreased knowledge of precautions       PT Treatment Interventions DME instruction;Gait training;Stair training;Functional mobility training;Therapeutic activities;Therapeutic exercise;Balance training;Cognitive remediation;Patient/family education    PT Goals (Current goals can be found in the Care Plan section)  Acute Rehab PT Goals Patient Stated Goal: go home  PT Goal Formulation: With patient Time For Goal Achievement: 04/11/18 Potential to Achieve Goals: Good    Frequency Min 2X/week   Barriers to discharge        Co-evaluation               AM-PAC PT "6 Clicks" Daily Activity  Outcome Measure Difficulty turning over in bed (including adjusting bedclothes, sheets and blankets)?: None Difficulty moving from lying on back to sitting on the side of the bed? : None Difficulty sitting down on and standing up from a chair with arms (e.g., wheelchair, bedside commode, etc,.)?: A Lot Help needed moving to and from a bed to chair (including a wheelchair)?: None Help needed walking in hospital room?: None Help needed climbing 3-5 steps with a railing? : A Little 6 Click Score: 21    End of Session Equipment Utilized During Treatment: Gait belt Activity Tolerance: Patient tolerated treatment well Patient left: with chair alarm set;with call bell/phone within reach;with family/visitor present Nurse Communication: Mobility status PT Visit Diagnosis: Muscle weakness (generalized) (M62.81);Difficulty in walking, not elsewhere classified (R26.2)    Time: 1610-9604 PT Time Calculation (min) (ACUTE ONLY): 25 min   Charges:   PT Evaluation $PT Eval Low Complexity: 1 Low PT Treatments $Therapeutic Activity: 8-22 mins   PT G Codes:        Malachi Pro, DPT 03/28/2018, 1:19 PM

## 2018-03-29 LAB — GLUCOSE, CAPILLARY
GLUCOSE-CAPILLARY: 142 mg/dL — AB (ref 70–99)
Glucose-Capillary: 110 mg/dL — ABNORMAL HIGH (ref 70–99)
Glucose-Capillary: 182 mg/dL — ABNORMAL HIGH (ref 70–99)
Glucose-Capillary: 179 mg/dL — ABNORMAL HIGH (ref 70–99)

## 2018-03-29 LAB — BASIC METABOLIC PANEL
ANION GAP: 8 (ref 5–15)
BUN: 23 mg/dL (ref 8–23)
CALCIUM: 8.3 mg/dL — AB (ref 8.9–10.3)
CO2: 23 mmol/L (ref 22–32)
Chloride: 103 mmol/L (ref 98–111)
Creatinine, Ser: 1.02 mg/dL (ref 0.61–1.24)
GFR calc Af Amer: >60 mL/min (ref >60)
GFR calc non Af Amer: >60 mL/min (ref >60)
Glucose, Bld: 156 mg/dL — ABNORMAL HIGH (ref 70–99)
POTASSIUM: 4 mmol/L (ref 3.5–5.1)
Sodium: 134 mmol/L — ABNORMAL LOW (ref 135–145)

## 2018-03-29 LAB — CBC
HEMATOCRIT: 30.7 % — AB (ref 40.0–52.0)
HEMOGLOBIN: 10.6 g/dL — AB (ref 13.0–18.0)
MCH: 30.6 pg (ref 26.0–34.0)
MCHC: 34.7 g/dL (ref 32.0–36.0)
MCV: 88.1 fL (ref 80.0–100.0)
Platelets: 264 10*3/uL (ref 150–440)
RBC: 3.48 MIL/uL — ABNORMAL LOW (ref 4.40–5.90)
RDW: 13 % (ref 11.5–14.5)
WBC: 12 10*3/uL — ABNORMAL HIGH (ref 3.8–10.6)

## 2018-03-29 LAB — ANAEROBIC CULTURE

## 2018-03-29 LAB — CULTURE, BLOOD (ROUTINE X 2)
CULTURE: NO GROWTH
CULTURE: NO GROWTH
SPECIAL REQUESTS: ADEQUATE
SPECIAL REQUESTS: ADEQUATE

## 2018-03-29 LAB — VANCOMYCIN, TROUGH: Vancomycin Tr: 15 ug/mL (ref 15–20)

## 2018-03-29 MED ORDER — MAGNESIUM HYDROXIDE 400 MG/5ML PO SUSP
30.0000 mL | Freq: Every day | ORAL | Status: DC | PRN
  Administered 2018-03-29: 30 mL via ORAL
  Filled 2018-03-29: qty 30

## 2018-03-29 MED ORDER — CEPASTAT 14.5 MG MT LOZG
1.0000 | LOZENGE | OROMUCOSAL | Status: DC | PRN
  Filled 2018-03-29: qty 9

## 2018-03-29 MED ORDER — MORPHINE SULFATE (PF) 2 MG/ML IV SOLN: 2.0000 mg | Freq: Four times a day (QID) | INTRAVENOUS | Status: DC | PRN

## 2018-03-29 NOTE — Consult Note (Addendum)
Pharmacy Antibiotic Note  [name] is a 77 y.o. male admitted on 03/24/2018 with Diabetic Foot [name] .  Pharmacy has been consulted for Vancomycin and Zosyn  Dosing. Patient received vancomycin 1750mg  IV x 1 dose in ED.    Plan: Ke: 0.043   T1/2: 16.2    Vd: 55.58  7/13 Vanc trough 15. Will continue current regimen of Vancomycin 1250 IV every 18 hours.  Goal trough 15-20 mcg/mL. Will order next trough level after 4 doses.   Continue Zosyn 3.375 IV EI every 8 hours.   Height: 5\' 9"  (175.3 cm) Weight: 175 lb (79.4 kg) IBW/kg (Calculated) : 70.7  Temp (24hrs), Avg:98.6 F (37 C), Min:97.6 F (36.4 C), Max:99.5 F (37.5 C)  Recent Labs  Lab 03/24/18 1651 03/26/18 0519 03/27/18 1120 03/28/18 0421 03/29/18 0409 03/29/18 1728  WBC 15.6*  --   --  14.5* 12.0*  --   CREATININE 1.34* 1.06  --  1.17 1.02  --   LATICACIDVEN 1.6  --   --   --   --   --   VANCOTROUGH  --   --  10*  --   --  15    Estimated Creatinine Clearance: 60.6 mL/min (by C-G formula based on SCr of 1.02 mg/dL).    Allergies  Allergen Reactions  . Flu Virus Vaccine Other (See Comments) and Nausea And Vomiting    Fever, chills, vomiting.     Antimicrobials this admission: 7/8 Zosyn  >>  7/8 vancomycin >>   Dose adjustments this admission: 7/11 1130 vanc level 10. Changed to 1250 mg q 18 hours. Level before 4th new dose.  Microbiology results: 7/8 BCx: pending  Thank you for allowing pharmacy to be a part of this patient's care.  Gardner CandleSheema M Rasmus Preusser, PharmD, BCPS Clinical Pharmacist 03/29/2018 5:55 PM

## 2018-03-29 NOTE — Progress Notes (Signed)
1 Day Post-Op   Subjective/Chief Complaint: Patient seen.  Some pain overnight in the foot but overall states he is doing okay.   Objective: Vital signs in last 24 hours: Temp:  [97.6 F (36.4 C)-99.2 F (37.3 C)] 98.5 F (36.9 C) (07/13 0715) Pulse Rate:  [70-82] 70 (07/13 0715) Resp:  [13-23] 17 (07/13 0500) BP: (113-163)/(55-69) 122/59 (07/13 0715) SpO2:  [95 %-99 %] 97 % (07/13 0715) Last BM Date: 03/27/18  Intake/Output from previous day: 07/12 0701 - 07/13 0700 In: 1385.1 [P.O.:720; I.V.:415; IV Piggyback:250.1] Out: 523 [Urine:520; Blood:3] Intake/Output this shift: Total I/O In: -  Out: 525 [Urine:525]  Minimal drainage is noted on the bandaging.  Wound edges at this point appear viable with no clear progressive necrosis.  White count trending slightly lower.  Lab Results:  Recent Labs    03/28/18 0421 03/29/18 0409  WBC 14.5* 12.0*  HGB 11.2* 10.6*  HCT 32.5* 30.7*  PLT 283 264   BMET Recent Labs    03/28/18 0421 03/29/18 0409  NA 135 134*  K 4.0 4.0  CL 102 103  CO2 23 23  GLUCOSE 127* 156*  BUN 18 23  CREATININE 1.17 1.02  CALCIUM 8.8* 8.3*   PT/INR No results for input(s): LABPROT, INR in the last 72 hours. ABG No results for input(s): PHART, HCO3 in the last 72 hours.  Invalid input(s): PCO2, PO2  Studies/Results: Mr Foot Left Wo Contrast  Result Date: 03/28/2018 CLINICAL DATA:  77 year old male recently diagnosed with diabetes who relates a several week history of a sore on his left foot. Thinks that he may have stepped on something. The area continues to have worsened and recently developed some significant redness and swelling in the left foot. Recently had some fever and chills as well as loss of appetite and presented to the emergency department where he was admitted with a diabetic foot infection. EXAM: MRI OF THE LEFT FOOT WITHOUT CONTRAST TECHNIQUE: Multiplanar, multisequence MR imaging of the left foot was performed. No intravenous  contrast was administered. COMPARISON:  None. FINDINGS: Patient motion degrades image quality limiting evaluation. Bones/Joint/Cartilage No marrow signal abnormality. No fracture or dislocation. Normal alignment. No joint effusion. 5 mm low signal focus in the dorsal aspect of the second MTP joint which may reflect dystrophic calcification versus loose body. Ligaments Collateral ligaments are intact.  Lisfranc ligament is intact. Muscles and Tendons Flexor, peroneal and extensor compartment tendons are intact. T2 hyperintense signal throughout the plantar musculature likely neurogenic. Soft tissue Soft tissue wound along the plantar aspect of the forefoot between the first and second MTP joints with severe surrounding soft tissue edema. Soft tissue edema extends through the first webspace into the dorsal aspect of the second metatarsal. No drainable fluid collection. IMPRESSION: 1. No evidence of osteomyelitis of the left forefoot. 2. Soft tissue wound along the plantar aspect of the forefoot between the first and second MTP joints. Surrounding cellulitis extending into the webspace and along the dorsal aspect of the second MTP joint. Electronically Signed   By: Elige KoHetal  Patel   On: 03/28/2018 10:04    Anti-infectives: Anti-infectives (From admission, onward)   Start     Dose/Rate Route Frequency Ordered Stop   03/28/18 2005  vancomycin (VANCOCIN) powder  Status:  Discontinued       As needed 03/28/18 2013 03/28/18 2022   03/28/18 0600  vancomycin (VANCOCIN) 1,250 mg in sodium chloride 0.9 % 250 mL IVPB     1,250 mg 166.7 mL/hr over  90 Minutes Intravenous Every 18 hours 03/27/18 1227     03/26/18 1259  ceFAZolin (ANCEF) 2-4 GM/100ML-% IVPB    Note to Pharmacy:  Littie Deeds  : cabinet override      03/26/18 1259 03/27/18 0114   03/25/18 1200  vancomycin (VANCOCIN) 1,250 mg in sodium chloride 0.9 % 250 mL IVPB  Status:  Discontinued     1,250 mg 166.7 mL/hr over 90 Minutes Intravenous Every 24  hours 03/24/18 2058 03/27/18 1227   03/24/18 2200  piperacillin-tazobactam (ZOSYN) IVPB 3.375 g  Status:  Discontinued     3.375 g 12.5 mL/hr over 240 Minutes Intravenous Every 8 hours 03/24/18 2050 03/28/18 1013   03/24/18 2145  piperacillin-tazobactam (ZOSYN) IVPB 3.375 g  Status:  Discontinued     3.375 g 100 mL/hr over 30 Minutes Intravenous  Once 03/24/18 2134 03/24/18 2136   03/24/18 2145  vancomycin (VANCOCIN) IVPB 1000 mg/200 mL premix  Status:  Discontinued     1,000 mg 200 mL/hr over 60 Minutes Intravenous  Once 03/24/18 2134 03/24/18 2136   03/24/18 2030  vancomycin (VANCOCIN) 1,750 mg in sodium chloride 0.9 % 500 mL IVPB     1,750 mg 250 mL/hr over 120 Minutes Intravenous  Once 03/24/18 1953 03/24/18 2132      Assessment/Plan: s/p Procedure(s): AMPUTATION TOE/ PARTIAL RAY RESECTION-LEFT 2ND AND 3RD (Left) 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   Plan: Wound was redressed.  At this point would recommend observation for another day or 2.  If he continues to remain stable we can follow outpatient but patient is still at significant risk for transmetatarsal amputation if it does start to necrosis.  We will follow closely over the next couple of days.  Repeat CBC tomorrow.  LOS: 5 days    Ricci Barker 03/29/2018

## 2018-03-29 NOTE — Anesthesia Postprocedure Evaluation (Signed)
Anesthesia Post Note  Patient: [name]  Procedure(s) Performed: AMPUTATION TOE/ PARTIAL RAY RESECTION-LEFT 2ND AND 3RD (Left )  Patient location during evaluation: PACU Anesthesia Type: General Level of consciousness: awake and alert Pain management: pain level controlled Vital Signs Assessment: post-procedure vital signs reviewed and stable Respiratory status: spontaneous breathing, nonlabored ventilation, respiratory function stable and patient connected to nasal cannula oxygen Cardiovascular status: blood pressure returned to baseline and stable Postop Assessment: no apparent nausea or vomiting Anesthetic complications: no     Last Vitals:  Vitals:   03/29/18 0500 03/29/18 0715  BP: 136/60 (!) 122/59  Pulse: 74 70  Resp: 17   Temp: 37.3 C 36.9 C  SpO2: 95% 97%    Last Pain:  Vitals:   03/29/18 0715  TempSrc: Oral  PainSc:                  Lenard SimmerAndrew Roza Creamer

## 2018-03-29 NOTE — Progress Notes (Signed)
[facility] [facility] [place] [facility]   PATIENT NAME: [name]    MR#:  [id number]  DATE OF BIRTH:  July 28, 1941  SUBJECTIVE:  CHIEF COMPLAINT:   Chief Complaint  Patient presents with  . Foot Pain   -complaints of sore throat from intubation -also complains of left foot pain. 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  HENT: Negative for congestion, hearing loss and nosebleeds.   Eyes: Negative for blurred vision and double vision.  Respiratory: Negative for cough, shortness of breath and wheezing.   Cardiovascular: Negative for chest pain and palpitations.  Gastrointestinal: Negative for abdominal pain, constipation, diarrhea, nausea and vomiting.  Genitourinary: Negative for dysuria.  Musculoskeletal: Positive for joint pain.  Neurological: Negative for dizziness, focal weakness, seizures, weakness and headaches.  Psychiatric/Behavioral: Negative for depression.    DRUG ALLERGIES:   Allergies  Allergen Reactions  . Flu Virus Vaccine Other (See Comments) and Nausea And Vomiting    Fever, chills, vomiting.     VITALS:  Blood pressure (!) 122/59, pulse 70, temperature 98.5 F (36.9 C), temperature source Oral, resp. rate 17, height 5\' 9"  (1.753 m), weight 79.4 kg (175 lb), SpO2 97 %.  PHYSICAL EXAMINATION:  Physical Exam  GENERAL:  77 y.o.-year-old patient lying in the bed with no acute distress.  EYES: Pupils equal, round, reactive to light and accommodation. No scleral icterus. Extraocular muscles intact.  HEENT: Head atraumatic, normocephalic. Oropharynx and nasopharynx clear.  NECK:  Supple, no jugular venous distention. No thyroid enlargement, no tenderness.  LUNGS: Normal breath sounds bilaterally, no wheezing, rales,rhonchi or crepitation. No use of accessory muscles of respiration.  CARDIOVASCULAR: S1, S2 normal. No murmurs,  rubs, or gallops.  ABDOMEN: Soft, nontender, nondistended. Bowel sounds present. No organomegaly or mass.  EXTREMITIES: left foot in a bulky dressing.  No pedal edema, cyanosis, or clubbing.  NEUROLOGIC: Cranial nerves II through XII are intact. Muscle strength 5/5 in all extremities. Sensation intact. Gait not checked.  PSYCHIATRIC: The patient is alert and oriented x 3.  SKIN: No obvious rash, lesion, or ulcer.    LABORATORY PANEL:   CBC Recent Labs  Lab 03/29/18 0409  WBC 12.0*  HGB 10.6*  HCT 30.7*  PLT 264   ------------------------------------------------------------------------------------------------------------------  Chemistries  Recent Labs  Lab 03/24/18 1651  03/29/18 0409  NA 132*   < > 134*  K 4.1   < > 4.0  CL 97*   < > 103  CO2 23   < > 23  GLUCOSE 165*   < > 156*  BUN 32*   < > 23  CREATININE 1.34*   < > 1.02  CALCIUM 9.3   < > 8.3*  AST 22  --   --   ALT 19  --   --   ALKPHOS 55  --   --   BILITOT 0.6  --   --    < > = values in this interval not displayed.   ------------------------------------------------------------------------------------------------------------------  Cardiac Enzymes No results for input(s): TROPONINI in the last 168 hours. ------------------------------------------------------------------------------------------------------------------  RADIOLOGY:  Mr Foot Left Wo Contrast  Result Date: 03/28/2018 CLINICAL DATA:  77 year old male recently diagnosed with diabetes who relates a several week history of a sore on his left foot. Thinks that he may have stepped on something. The area continues to have worsened and recently developed some significant redness and  swelling in the left foot. Recently had some fever and chills as well as loss of appetite and presented to the emergency department where he was admitted with a diabetic foot infection. EXAM: MRI OF THE LEFT FOOT WITHOUT CONTRAST TECHNIQUE: Multiplanar, multisequence MR  imaging of the left foot was performed. No intravenous contrast was administered. COMPARISON:  None. FINDINGS: Patient motion degrades image quality limiting evaluation. Bones/Joint/Cartilage No marrow signal abnormality. No fracture or dislocation. Normal alignment. No joint effusion. 5 mm low signal focus in the dorsal aspect of the second MTP joint which may reflect dystrophic calcification versus loose body. Ligaments Collateral ligaments are intact.  Lisfranc ligament is intact. Muscles and Tendons Flexor, peroneal and extensor compartment tendons are intact. T2 hyperintense signal throughout the plantar musculature likely neurogenic. Soft tissue Soft tissue wound along the plantar aspect of the forefoot between the first and second MTP joints with severe surrounding soft tissue edema. Soft tissue edema extends through the first webspace into the dorsal aspect of the second metatarsal. No drainable fluid collection. IMPRESSION: 1. No evidence of osteomyelitis of the left forefoot. 2. Soft tissue wound along the plantar aspect of the forefoot between the first and second MTP joints. Surrounding cellulitis extending into the webspace and along the dorsal aspect of the second MTP joint. Electronically Signed   By: Elige KoHetal  Patel   On: 03/28/2018 10:04    EKG:  No orders found for this or any previous visit.  ASSESSMENT AND PLAN:   77 year old male with past medical history significant for hypertension and diabetes presents to hospital secondary to left foot cellulitis and abscess  1.  Diabetic foot infection with left foot cellulitis and abscess-appreciate podiatry consult -Patient had bedside I&D done.  Cultures growing gram-positive cocci and gram variable rods-final cultures are pending. -On vancomycin -status post rehabilitation of left foot second and third toe secondary to gangrene. -MRI negative for osteomyelitis -Status post angiogram of the left leg and atherectomy and angioplasty done.   Appreciate vascular consult-on Plavix for the same -Postop boot available. .  2.  Acute renal failure-lisinopril and metformin on hold.  3.  Diabetes mellitus-sliding scale insulin for now.  A1c is 7.2  4.  Hypertension-  on Norvasc  5.  DVT prophylaxis-subcutaneous heparin  Patient is independent at baseline.   PT consult recommended home health  possible discharge tomorrow.  await podiatry recommendations    All the records are reviewed and case discussed with Care Management/Social Workerr. Management plans discussed with the patient, family and they are in agreement.  CODE STATUS: Full code  TOTAL TIME TAKING CARE OF THIS PATIENT: 36 minutes.   POSSIBLE D/C 1-2 days, DEPENDING ON CLINICAL CONDITION.   Enid BaasKALISETTI,Tilley Faeth M.D on 03/29/2018 at 10:00 AM  Between 7am to 6pm - Pager - [phone]  After 6pm go to [email or web address] - Social research officer, governmentpassword EPAS [facility]  [facility]  Office  [phone]  CC: Primary care physician; Whitaker, CSX CorporationJason Hestle, PA-C

## 2018-03-29 NOTE — Plan of Care (Signed)
  Problem: Education: Goal: Knowledge of General Education information will improve Outcome: Progressing   Problem: Health Behavior/Discharge Planning: Goal: Ability to manage health-related needs will improve Outcome: Progressing   Problem: Clinical Measurements: Goal: Ability to maintain clinical measurements within normal limits will improve Outcome: Progressing Goal: Will remain free from infection Outcome: Progressing Goal: Diagnostic test results will improve Outcome: Progressing Goal: Respiratory complications will improve Outcome: Progressing Goal: Cardiovascular complication will be avoided Outcome: Progressing   Problem: Activity: Goal: Risk for activity intolerance will decrease Outcome: Progressing   Problem: Nutrition: Goal: Adequate nutrition will be maintained Outcome: Progressing   Problem: Coping: Goal: Level of anxiety will decrease Outcome: Progressing   Problem: Elimination: Goal: Will not experience complications related to bowel motility Outcome: Progressing Goal: Will not experience complications related to urinary retention Outcome: Progressing   Problem: Pain Managment: Goal: General experience of comfort will improve Outcome: Progressing   Problem: Safety: Goal: Ability to remain free from injury will improve Outcome: Progressing   Problem: Skin Integrity: Goal: Risk for impaired skin integrity will decrease Outcome: Progressing   Problem: Education: Goal: Ability to describe self-care measures that may prevent or decrease complications (Diabetes Survival Skills Education) will improve Outcome: Progressing   Problem: Coping: Goal: Ability to adjust to condition or change in health will improve Outcome: Progressing   Problem: Fluid Volume: Goal: Ability to maintain a balanced intake and output will improve Outcome: Progressing   Problem: Health Behavior/Discharge Planning: Goal: Ability to identify and utilize available resources  and services will improve Outcome: Progressing Goal: Ability to manage health-related needs will improve Outcome: Progressing   Problem: Metabolic: Goal: Ability to maintain appropriate glucose levels will improve Outcome: Progressing   Problem: Nutritional: Goal: Maintenance of adequate nutrition will improve Outcome: Progressing Goal: Progress toward achieving an optimal weight will improve Outcome: Progressing   Problem: Skin Integrity: Goal: Risk for impaired skin integrity will decrease Outcome: Progressing   Problem: Tissue Perfusion: Goal: Adequacy of tissue perfusion will improve Outcome: Progressing   

## 2018-03-30 ENCOUNTER — Encounter: Payer: Self-pay | Admitting: Podiatry

## 2018-03-30 DIAGNOSIS — Z23 Encounter for immunization: Secondary | ICD-10-CM | POA: Diagnosis not present

## 2018-03-30 LAB — CBC WITH DIFFERENTIAL/PLATELET
BASOS PCT: 1 %
Basophils Absolute: 0.1 10*3/uL (ref 0–0.1)
EOS ABS: 0.5 10*3/uL (ref 0–0.7)
Eosinophils Relative: 5 %
HEMATOCRIT: 30 % — AB (ref 40.0–52.0)
HEMOGLOBIN: 10.5 g/dL — AB (ref 13.0–18.0)
LYMPHS ABS: 2.9 10*3/uL (ref 1.0–3.6)
Lymphocytes Relative: 27 %
MCH: 30.8 pg (ref 26.0–34.0)
MCHC: 35.1 g/dL (ref 32.0–36.0)
MCV: 87.9 fL (ref 80.0–100.0)
Monocytes Absolute: 0.8 10*3/uL (ref 0.2–1.0)
Monocytes Relative: 8 %
NEUTROS ABS: 6.2 10*3/uL (ref 1.4–6.5)
NEUTROS PCT: 59 %
Platelets: 283 10*3/uL (ref 150–440)
RBC: 3.42 MIL/uL — AB (ref 4.40–5.90)
RDW: 13.3 % (ref 11.5–14.5)
WBC: 10.5 10*3/uL (ref 3.8–10.6)

## 2018-03-30 LAB — BASIC METABOLIC PANEL
ANION GAP: 8 (ref 5–15)
BUN: 21 mg/dL (ref 8–23)
CHLORIDE: 103 mmol/L (ref 98–111)
CO2: 25 mmol/L (ref 22–32)
CREATININE: 1.22 mg/dL (ref 0.61–1.24)
Calcium: 8.6 mg/dL — ABNORMAL LOW (ref 8.9–10.3)
GFR calc Af Amer: >60 mL/min (ref >60)
GFR calc non Af Amer: 55 mL/min — ABNORMAL LOW (ref >60)
Glucose, Bld: 130 mg/dL — ABNORMAL HIGH (ref 70–99)
POTASSIUM: 4.5 mmol/L (ref 3.5–5.1)
Sodium: 136 mmol/L (ref 135–145)

## 2018-03-30 LAB — GLUCOSE, CAPILLARY
GLUCOSE-CAPILLARY: 171 mg/dL — AB (ref 70–99)
Glucose-Capillary: 114 mg/dL — ABNORMAL HIGH (ref 70–99)

## 2018-03-30 MED ORDER — OXYCODONE HCL 5 MG PO TABS: 5.0000 mg | ORAL_TABLET | Freq: Four times a day (QID) | ORAL | 0 refills | Status: DC | PRN

## 2018-03-30 MED ORDER — AMOXICILLIN-POT CLAVULANATE 875-125 MG PO TABS: 1.0000 | ORAL_TABLET | Freq: Two times a day (BID) | ORAL | 0 refills | Status: AC

## 2018-03-30 MED ORDER — BISACODYL 10 MG RE SUPP
10.0000 mg | Freq: Once | RECTAL | Status: AC
  Administered 2018-03-30: 10 mg via RECTAL
  Filled 2018-03-30: qty 1

## 2018-03-30 MED ORDER — CLOPIDOGREL BISULFATE 75 MG PO TABS: 75.0000 mg | ORAL_TABLET | Freq: Every day | ORAL | 4 refills | Status: AC

## 2018-03-30 NOTE — Progress Notes (Signed)
2 Days Post-Op   Subjective/Chief Complaint: Patient seen.  Foot is feeling some better than yesterday.   Objective: Vital signs in last 24 hours: Temp:  [98.6 F (37 C)-99.5 F (37.5 C)] 98.6 F (37 C) (07/14 0750) Pulse Rate:  [70-78] 70 (07/14 0750) Resp:  [16-18] 18 (07/14 0750) BP: (116-147)/(55-69) 129/69 (07/14 0750) SpO2:  [95 %-97 %] 96 % (07/14 0750) Last BM Date: 03/27/18  Intake/Output from previous day: 07/13 0701 - 07/14 0700 In: 730.1 [P.O.:480; IV Piggyback:250.1] Out: 525 [Urine:525] Intake/Output this shift: No intake/output data recorded.  The bandage is dry and intact.  Upon removal there is still just mild drainage on the bandaging.  Dorsal and plantar aspects of the incision are still coapted with no evidence of any progressive necrosis.  Erythema and edema has significantly improved.  No signs of any progressive gangrenous changes in the remaining digits.  Lab Results:  Recent Labs    03/29/18 0409 03/30/18 0444  WBC 12.0* 10.5  HGB 10.6* 10.5*  HCT 30.7* 30.0*  PLT 264 283   BMET Recent Labs    03/29/18 0409 03/30/18 0444  NA 134* 136  K 4.0 4.5  CL 103 103  CO2 23 25  GLUCOSE 156* 130*  BUN 23 21  CREATININE 1.02 1.22  CALCIUM 8.3* 8.6*   PT/INR No results for input(s): LABPROT, INR in the last 72 hours. ABG No results for input(s): PHART, HCO3 in the last 72 hours.  Invalid input(s): PCO2, PO2  Studies/Results: No results found.  Anti-infectives: Anti-infectives (From admission, onward)   Start     Dose/Rate Route Frequency Ordered Stop   03/28/18 2005  vancomycin (VANCOCIN) powder  Status:  Discontinued       As needed 03/28/18 2013 03/28/18 2022   03/28/18 0600  vancomycin (VANCOCIN) 1,250 mg in sodium chloride 0.9 % 250 mL IVPB     1,250 mg 166.7 mL/hr over 90 Minutes Intravenous Every 18 hours 03/27/18 1227     03/26/18 1259  ceFAZolin (ANCEF) 2-4 GM/100ML-% IVPB    Note to Pharmacy:  Littie DeedsJarrett, Catherine  : cabinet  override      03/26/18 1259 03/27/18 0114   03/25/18 1200  vancomycin (VANCOCIN) 1,250 mg in sodium chloride 0.9 % 250 mL IVPB  Status:  Discontinued     1,250 mg 166.7 mL/hr over 90 Minutes Intravenous Every 24 hours 03/24/18 2058 03/27/18 1227   03/24/18 2200  piperacillin-tazobactam (ZOSYN) IVPB 3.375 g  Status:  Discontinued     3.375 g 12.5 mL/hr over 240 Minutes Intravenous Every 8 hours 03/24/18 2050 03/28/18 1013   03/24/18 2145  piperacillin-tazobactam (ZOSYN) IVPB 3.375 g  Status:  Discontinued     3.375 g 100 mL/hr over 30 Minutes Intravenous  Once 03/24/18 2134 03/24/18 2136   03/24/18 2145  vancomycin (VANCOCIN) IVPB 1000 mg/200 mL premix  Status:  Discontinued     1,000 mg 200 mL/hr over 60 Minutes Intravenous  Once 03/24/18 2134 03/24/18 2136   03/24/18 2030  vancomycin (VANCOCIN) 1,750 mg in sodium chloride 0.9 % 500 mL IVPB     1,750 mg 250 mL/hr over 120 Minutes Intravenous  Once 03/24/18 1953 03/24/18 2132      Assessment/Plan: s/p Procedure(s): AMPUTATION TOE/ PARTIAL RAY RESECTION-LEFT 2ND AND 3RD (Left) Assessment: 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   Plan: The wound was redressed.  Discussed with the patient and his wife that I think he  should be stable for discharge today on oral antibiotics.  We will have home health care change the bandage 3 times a week.  Discussed that they may want to transition and train her how to perform this.  Discussed that still no guarantee that this will heal and he may need further amputation but at this point stable enough that we should be able to allow him to go home.  LOS: 6 days    Ricci Barker 03/30/2018

## 2018-03-30 NOTE — Plan of Care (Signed)
  Problem: Health Behavior/Discharge Planning: Goal: Ability to manage health-related needs will improve Outcome: Progressing   Problem: Clinical Measurements: Goal: Ability to maintain clinical measurements within normal limits will improve Outcome: Progressing Goal: Will remain free from infection Outcome: Progressing   Problem: Activity: Goal: Risk for activity intolerance will decrease Outcome: Progressing   Problem: Elimination: Goal: Will not experience complications related to bowel motility Outcome: Progressing   Problem: Pain Managment: Goal: General experience of comfort will improve Outcome: Progressing   Problem: Safety: Goal: Ability to remain free from injury will improve Outcome: Progressing   Problem: Skin Integrity: Goal: Risk for impaired skin integrity will decrease Outcome: Progressing

## 2018-03-30 NOTE — Care Management Note (Signed)
Case Management Note  Patient Details  Name: [name] MRN: [id number] Date of Birth: September 26, 1940  Subjective/Objective:   Patient to be discharged per MD order. Orders in place for home health services. Given preference patient and family prefer [facility] home [facility] agency. Referral placed with Rosey Batheresa from [facility] who agrees to accept the patient for PT,RN services. Patient has a walker in the home. No other DME needs. Spouse to provide transport home. No further RNCM needs. Buddy DutyJosh Seferina Brokaw RN BSN RNCM [phone]                   Action/Plan:   Expected Discharge Date:  03/30/18               Expected Discharge Plan:     In-House Referral:     Discharge planning Services  CM Consult  Post Acute Care Choice:  Home Health Choice offered to:  Patient, Adult Children, Spouse  DME Arranged:    DME Agency:     HH Arranged:  RN, PT HH Agency:  [facility]  Status of Service:  Completed, signed off  If discussed at [employer] Length of [employer] Meetings, dates discussed:    Additional Comments:  Virgel ManifoldJosh A Severa Jeremiah, RN 03/30/2018, 1:31 PM

## 2018-03-30 NOTE — Discharge Summary (Signed)
[facility] [place] [facility]   PATIENT NAME: Charles CongoHoward [name]    MR#:  [id number]  DATE OF BIRTH:  07/01/1941  DATE OF ADMISSION:  03/24/2018   ADMITTING PHYSICIAN: Delfino LovettVipul Shah, MD  DATE OF DISCHARGE:  03/30/18  PRIMARY CARE PHYSICIAN: Wilford CornerWhitaker, Jason Hestle, PA-C   ADMISSION DIAGNOSIS:   Hyponatremia [E87.1] Acute renal insufficiency [N28.9] Diabetic foot infection (HCC) [E11.628, L08.9]  DISCHARGE DIAGNOSIS:   Active Problems:   Diabetic foot infection (HCC)   SECONDARY DIAGNOSIS:   Past Medical History:  Diagnosis Date  . Diabetes mellitus without complication [facility](HCC)     HOSPITAL COURSE:   77 year old male with past medical history significant for hypertension and diabetes presents to hospital secondary to left foot cellulitis and abscess  1.  Diabetic foot infection with left foot cellulitis and abscess-appreciate podiatry consult -Patient had bedside I&D initially.  Cultures growing gram-positive cocci and gram variable rods- . -On vancomycin- change to augmentin for 10 days at discharge -status post rehabilitation of left foot second and third toe secondary to gangrene. -MRI negative for osteomyelitis -Status post angiogram of the left leg and atherectomy and angioplasty done.  Appreciate vascular consult-on Plavix for the same -Postop boot given. .  2.  Acute renal failure- improved with fluids  3.  Diabetes mellitus-sliding scale insulin for now.  A1c is 7.2 On metformin  4.  Hypertension-  on Norvasc and lisinopril   Patient is independent at baseline.   PT consult recommended home health  Stable for discharge today    DISCHARGE CONDITIONS:   Guarded  CONSULTS OBTAINED:   Treatment Team:  Linus Galasline, Todd, DPM Schnier, Latina CraverGregory G, MD  DRUG ALLERGIES:   Allergies  Allergen Reactions  . Flu Virus Vaccine Other (See Comments) and Nausea And Vomiting    Fever, chills, vomiting.    DISCHARGE MEDICATIONS:    Allergies as of 03/30/2018      Reactions   Flu Virus Vaccine Other (See Comments), Nausea And Vomiting   Fever, chills, vomiting.       Medication List    TAKE these medications   amLODipine 2.5 MG tablet Commonly known as:  NORVASC Take 2.5 mg by mouth daily.   amoxicillin-clavulanate 875-125 MG tablet Commonly known as:  AUGMENTIN Take 1 tablet by mouth every 12 (twelve) hours for 10 days.   clopidogrel 75 MG tablet Commonly known as:  PLAVIX Take 1 tablet (75 mg total) by mouth daily.   lisinopril 5 MG tablet Commonly known as:  PRINIVIL,ZESTRIL Take 5 mg by mouth daily.   metFORMIN 500 MG tablet Commonly known as:  GLUCOPHAGE Take 1,000 mg by mouth 2 (two) times daily.   oxyCODONE 5 MG immediate release tablet Commonly known as:  Oxy IR/ROXICODONE Take 1 tablet (5 mg total) by mouth every 6 (six) hours as needed for moderate pain or severe pain.        DISCHARGE INSTRUCTIONS:   1. PCP f/u in 1-2 weeks 2. Podiatry f/u in 1-2 weeks 3. Vascular f/u in 3 weeks  DIET:   Cardiac diet and Diabetic diet  ACTIVITY:   Activity as tolerated  OXYGEN:   Home Oxygen: No.  Oxygen Delivery: room air  DISCHARGE LOCATION:   home   If you experience worsening of your admission symptoms, develop shortness of breath, life threatening emergency, suicidal or homicidal thoughts you must seek medical attention immediately by calling 911 or calling your MD immediately  if symptoms less severe.  You Must  read complete instructions/literature along with all the possible adverse reactions/side effects for all the Medicines you take and that have been prescribed to you. Take any new Medicines after you have completely understood and accpet all the possible adverse reactions/side effects.   Please note  You were cared for by a hospitalist during your hospital stay. If you have any questions about your discharge medications or the care you received while you were in the  hospital after you are discharged, you can call the unit and asked to speak with the hospitalist on call if the hospitalist that took care of you is not available. Once you are discharged, your primary care physician will handle any further medical issues. Please note that NO REFILLS for any discharge medications will be authorized once you are discharged, as it is imperative that you return to your primary care physician (or establish a relationship with a primary care physician if you do not have one) for your aftercare needs so that they can reassess your need for medications and monitor your lab values.    On the day of Discharge:  VITAL SIGNS:   Blood pressure 129/69, pulse 70, temperature 98.6 F (37 C), temperature source Oral, resp. rate 18, height 5\' 9"  (1.753 m), weight 79.4 kg (175 lb), SpO2 96 %.  PHYSICAL EXAMINATION:   GENERAL:  77 y.o.-year-old patient lying in the bed with no acute distress.  EYES: Pupils equal, round, reactive to light and accommodation. No scleral icterus. Extraocular muscles intact.  HEENT: Head atraumatic, normocephalic. Oropharynx and nasopharynx clear.  NECK:  Supple, no jugular venous distention. No thyroid enlargement, no tenderness.  LUNGS: Normal breath sounds bilaterally, no wheezing, rales,rhonchi or crepitation. No use of accessory muscles of respiration.  CARDIOVASCULAR: S1, S2 normal. No murmurs, rubs, or gallops.  ABDOMEN: Soft, nontender, nondistended. Bowel sounds present. No organomegaly or mass.  EXTREMITIES: left foot in a bulky dressing.  No pedal edema, cyanosis, or clubbing.  NEUROLOGIC: Cranial nerves II through XII are intact. Muscle strength 5/5 in all extremities. Sensation intact. Gait not checked.  PSYCHIATRIC: The patient is alert and oriented x 3.  SKIN: No obvious rash, lesion, or ulcer.      DATA REVIEW:   CBC Recent Labs  Lab 03/30/18 0444  WBC 10.5  HGB 10.5*  HCT 30.0*  PLT 283    Chemistries  Recent Labs    Lab 03/24/18 1651  03/30/18 0444  NA 132*   < > 136  K 4.1   < > 4.5  CL 97*   < > 103  CO2 23   < > 25  GLUCOSE 165*   < > 130*  BUN 32*   < > 21  CREATININE 1.34*   < > 1.22  CALCIUM 9.3   < > 8.6*  AST 22  --   --   ALT 19  --   --   ALKPHOS 55  --   --   BILITOT 0.6  --   --    < > = values in this interval not displayed.     Microbiology Results  Results for orders placed or performed during the hospital encounter of 03/24/18  Blood culture (routine x 2)     Status: None   Collection Time: 03/24/18  4:51 PM  Result Value Ref Range Status   Specimen Description BLOOD RIGHT ASSIST CONTROL  Final   Special Requests   Final    BOTTLES DRAWN AEROBIC AND ANAEROBIC Blood Culture  adequate volume   Culture   Final    NO GROWTH 5 DAYS Performed at [facility], [address] [place], [place] [address]    Report Status 03/29/2018 FINAL  Final  Blood culture (routine x 2)     Status: None   Collection Time: 03/24/18  8:30 PM  Result Value Ref Range Status   Specimen Description BLOOD BLOOD LEFT ARM  Final   Special Requests   Final    BOTTLES DRAWN AEROBIC AND ANAEROBIC Blood Culture adequate volume   Culture   Final    NO GROWTH 5 DAYS Performed at [facility], [address] [place], [place] [address]    Report Status 03/29/2018 FINAL  Final  Anaerobic culture     Status: None   Collection Time: 03/25/18  1:32 PM  Result Value Ref Range Status   Specimen Description   Final    ABSCESS Performed at [facility], [address] [place], [place] [address]    Special Requests NONE  Final   Gram Stain   Final    RARE WBC PRESENT,BOTH PMN AND MONONUCLEAR RARE GRAM POSITIVE COCCI FEW GRAM VARIABLE ROD ABUNDANT SQUAMOUS EPITHELIAL CELLS PRESENT    Culture   Final    NO ANAEROBES ISOLATED Performed at [facility], [address] [place], [place] [address]    Report Status 03/29/2018 FINAL  Final  Aerobic/Anaerobic Culture  (surgical/deep wound)     Status: None (Preliminary result)   Collection Time: 03/28/18  7:42 PM  Result Value Ref Range Status   Specimen Description   Final    WOUND LEFT TOE 2ND Performed at [facility], [address] [place], [place] [address]    Special Requests   Final    NONE Performed at [facility], [address] [place], [place] [address]    Gram Stain   Final    ABUNDANT WBC PRESENT, PREDOMINANTLY PMN RARE GRAM VARIABLE ROD Performed at [facility], [address] [place], [place] [address]    Culture PENDING  Incomplete   Report Status PENDING  Incomplete    RADIOLOGY:  No results found.   Management plans discussed with the patient, family and they are in agreement.  CODE STATUS:     Code Status Orders  (From admission, onward)        Start     Ordered   03/26/18 1836  Full code  Continuous     03/26/18 1835    Code Status History    This patient has a current code status but no historical code status.      TOTAL TIME TAKING CARE OF THIS PATIENT: 37 minutes.    Enid Baas M.D on 03/30/2018 at 11:22 AM  Between 7am to 6pm - Pager - [phone]  After 6pm go to [email or web address] - Scientist, research (life sciences) [place]  Office  [phone]  CC: Primary care physician; Whitaker, Jonnie Finner, PA-C   Note: This dictation was prepared with Dragon dictation along with smaller phrase technology. Any transcriptional errors that result from this process are unintentional.

## 2018-03-30 NOTE — Progress Notes (Signed)
Patient is being discharged to home with HH/PT. Following a BM. DC & RX instructions given and patient and wife acknowledged understanding. IV removed.  Patient dressed, belongings packed.  Family is here to drive patient home.

## 2018-03-31 DIAGNOSIS — B9689 Other specified bacterial agents as the cause of diseases classified elsewhere: Secondary | ICD-10-CM | POA: Diagnosis not present

## 2018-03-31 DIAGNOSIS — Z792 Long term (current) use of antibiotics: Secondary | ICD-10-CM | POA: Diagnosis not present

## 2018-03-31 DIAGNOSIS — Z7902 Long term (current) use of antithrombotics/antiplatelets: Secondary | ICD-10-CM | POA: Diagnosis not present

## 2018-03-31 DIAGNOSIS — Z87891 Personal history of nicotine dependence: Secondary | ICD-10-CM | POA: Diagnosis not present

## 2018-03-31 DIAGNOSIS — Z7984 Long term (current) use of oral hypoglycemic drugs: Secondary | ICD-10-CM | POA: Diagnosis not present

## 2018-03-31 DIAGNOSIS — T8744 Infection of amputation stump, left lower extremity: Secondary | ICD-10-CM | POA: Diagnosis not present

## 2018-03-31 DIAGNOSIS — Z8631 Personal history of diabetic foot ulcer: Secondary | ICD-10-CM | POA: Diagnosis not present

## 2018-03-31 DIAGNOSIS — L03116 Cellulitis of left lower limb: Secondary | ICD-10-CM | POA: Diagnosis not present

## 2018-03-31 DIAGNOSIS — E1152 Type 2 diabetes mellitus with diabetic peripheral angiopathy with gangrene: Secondary | ICD-10-CM | POA: Diagnosis not present

## 2018-03-31 NOTE — Progress Notes (Signed)
Cultures growing citrobacter- discussed with pharmacy Discontinue augmentin and called patients pharmacy to start ciprofloxacin 500mg  BID for 10 days instead. Left a voicemail for patient as well

## 2018-04-02 DIAGNOSIS — Z87891 Personal history of nicotine dependence: Secondary | ICD-10-CM | POA: Diagnosis not present

## 2018-04-02 DIAGNOSIS — Z8631 Personal history of diabetic foot ulcer: Secondary | ICD-10-CM | POA: Diagnosis not present

## 2018-04-02 DIAGNOSIS — E1152 Type 2 diabetes mellitus with diabetic peripheral angiopathy with gangrene: Secondary | ICD-10-CM | POA: Diagnosis not present

## 2018-04-02 DIAGNOSIS — T8744 Infection of amputation stump, left lower extremity: Secondary | ICD-10-CM | POA: Diagnosis not present

## 2018-04-02 DIAGNOSIS — Z7902 Long term (current) use of antithrombotics/antiplatelets: Secondary | ICD-10-CM | POA: Diagnosis not present

## 2018-04-02 DIAGNOSIS — Z7984 Long term (current) use of oral hypoglycemic drugs: Secondary | ICD-10-CM | POA: Diagnosis not present

## 2018-04-02 DIAGNOSIS — Z792 Long term (current) use of antibiotics: Secondary | ICD-10-CM | POA: Diagnosis not present

## 2018-04-02 DIAGNOSIS — B9689 Other specified bacterial agents as the cause of diseases classified elsewhere: Secondary | ICD-10-CM | POA: Diagnosis not present

## 2018-04-02 DIAGNOSIS — L03116 Cellulitis of left lower limb: Secondary | ICD-10-CM | POA: Diagnosis not present

## 2018-04-02 LAB — SURGICAL PATHOLOGY

## 2018-04-03 DIAGNOSIS — Z7902 Long term (current) use of antithrombotics/antiplatelets: Secondary | ICD-10-CM | POA: Diagnosis not present

## 2018-04-03 DIAGNOSIS — B9689 Other specified bacterial agents as the cause of diseases classified elsewhere: Secondary | ICD-10-CM | POA: Diagnosis not present

## 2018-04-03 DIAGNOSIS — Z792 Long term (current) use of antibiotics: Secondary | ICD-10-CM | POA: Diagnosis not present

## 2018-04-03 DIAGNOSIS — L03116 Cellulitis of left lower limb: Secondary | ICD-10-CM | POA: Diagnosis not present

## 2018-04-03 DIAGNOSIS — Z7984 Long term (current) use of oral hypoglycemic drugs: Secondary | ICD-10-CM | POA: Diagnosis not present

## 2018-04-03 DIAGNOSIS — Z8631 Personal history of diabetic foot ulcer: Secondary | ICD-10-CM | POA: Diagnosis not present

## 2018-04-03 DIAGNOSIS — E1152 Type 2 diabetes mellitus with diabetic peripheral angiopathy with gangrene: Secondary | ICD-10-CM | POA: Diagnosis not present

## 2018-04-03 DIAGNOSIS — Z87891 Personal history of nicotine dependence: Secondary | ICD-10-CM | POA: Diagnosis not present

## 2018-04-03 DIAGNOSIS — T8744 Infection of amputation stump, left lower extremity: Secondary | ICD-10-CM | POA: Diagnosis not present

## 2018-04-03 LAB — AEROBIC/ANAEROBIC CULTURE W GRAM STAIN (SURGICAL/DEEP WOUND)

## 2018-04-03 LAB — AEROBIC/ANAEROBIC CULTURE (SURGICAL/DEEP WOUND)

## 2018-04-04 DIAGNOSIS — T8744 Infection of amputation stump, left lower extremity: Secondary | ICD-10-CM | POA: Diagnosis not present

## 2018-04-04 DIAGNOSIS — L03116 Cellulitis of left lower limb: Secondary | ICD-10-CM | POA: Diagnosis not present

## 2018-04-04 DIAGNOSIS — Z792 Long term (current) use of antibiotics: Secondary | ICD-10-CM | POA: Diagnosis not present

## 2018-04-04 DIAGNOSIS — Z8631 Personal history of diabetic foot ulcer: Secondary | ICD-10-CM | POA: Diagnosis not present

## 2018-04-04 DIAGNOSIS — B9689 Other specified bacterial agents as the cause of diseases classified elsewhere: Secondary | ICD-10-CM | POA: Diagnosis not present

## 2018-04-04 DIAGNOSIS — Z7984 Long term (current) use of oral hypoglycemic drugs: Secondary | ICD-10-CM | POA: Diagnosis not present

## 2018-04-04 DIAGNOSIS — Z7902 Long term (current) use of antithrombotics/antiplatelets: Secondary | ICD-10-CM | POA: Diagnosis not present

## 2018-04-04 DIAGNOSIS — Z87891 Personal history of nicotine dependence: Secondary | ICD-10-CM | POA: Diagnosis not present

## 2018-04-04 DIAGNOSIS — E1152 Type 2 diabetes mellitus with diabetic peripheral angiopathy with gangrene: Secondary | ICD-10-CM | POA: Diagnosis not present

## 2018-04-07 DIAGNOSIS — B9689 Other specified bacterial agents as the cause of diseases classified elsewhere: Secondary | ICD-10-CM | POA: Diagnosis not present

## 2018-04-07 DIAGNOSIS — Z87891 Personal history of nicotine dependence: Secondary | ICD-10-CM | POA: Diagnosis not present

## 2018-04-07 DIAGNOSIS — T8744 Infection of amputation stump, left lower extremity: Secondary | ICD-10-CM | POA: Diagnosis not present

## 2018-04-07 DIAGNOSIS — Z7902 Long term (current) use of antithrombotics/antiplatelets: Secondary | ICD-10-CM | POA: Diagnosis not present

## 2018-04-07 DIAGNOSIS — Z7984 Long term (current) use of oral hypoglycemic drugs: Secondary | ICD-10-CM | POA: Diagnosis not present

## 2018-04-07 DIAGNOSIS — E1152 Type 2 diabetes mellitus with diabetic peripheral angiopathy with gangrene: Secondary | ICD-10-CM | POA: Diagnosis not present

## 2018-04-07 DIAGNOSIS — Z792 Long term (current) use of antibiotics: Secondary | ICD-10-CM | POA: Diagnosis not present

## 2018-04-07 DIAGNOSIS — Z8631 Personal history of diabetic foot ulcer: Secondary | ICD-10-CM | POA: Diagnosis not present

## 2018-04-07 DIAGNOSIS — L03116 Cellulitis of left lower limb: Secondary | ICD-10-CM | POA: Diagnosis not present

## 2018-04-09 DIAGNOSIS — B9689 Other specified bacterial agents as the cause of diseases classified elsewhere: Secondary | ICD-10-CM | POA: Diagnosis not present

## 2018-04-09 DIAGNOSIS — Z7902 Long term (current) use of antithrombotics/antiplatelets: Secondary | ICD-10-CM | POA: Diagnosis not present

## 2018-04-09 DIAGNOSIS — Z87891 Personal history of nicotine dependence: Secondary | ICD-10-CM | POA: Diagnosis not present

## 2018-04-09 DIAGNOSIS — Z7984 Long term (current) use of oral hypoglycemic drugs: Secondary | ICD-10-CM | POA: Diagnosis not present

## 2018-04-09 DIAGNOSIS — T8744 Infection of amputation stump, left lower extremity: Secondary | ICD-10-CM | POA: Diagnosis not present

## 2018-04-09 DIAGNOSIS — L03116 Cellulitis of left lower limb: Secondary | ICD-10-CM | POA: Diagnosis not present

## 2018-04-09 DIAGNOSIS — E1152 Type 2 diabetes mellitus with diabetic peripheral angiopathy with gangrene: Secondary | ICD-10-CM | POA: Diagnosis not present

## 2018-04-09 DIAGNOSIS — Z8631 Personal history of diabetic foot ulcer: Secondary | ICD-10-CM | POA: Diagnosis not present

## 2018-04-09 DIAGNOSIS — Z792 Long term (current) use of antibiotics: Secondary | ICD-10-CM | POA: Diagnosis not present

## 2018-04-11 DIAGNOSIS — B9689 Other specified bacterial agents as the cause of diseases classified elsewhere: Secondary | ICD-10-CM | POA: Diagnosis not present

## 2018-04-11 DIAGNOSIS — Z87891 Personal history of nicotine dependence: Secondary | ICD-10-CM | POA: Diagnosis not present

## 2018-04-11 DIAGNOSIS — E1152 Type 2 diabetes mellitus with diabetic peripheral angiopathy with gangrene: Secondary | ICD-10-CM | POA: Diagnosis not present

## 2018-04-11 DIAGNOSIS — Z8631 Personal history of diabetic foot ulcer: Secondary | ICD-10-CM | POA: Diagnosis not present

## 2018-04-11 DIAGNOSIS — Z7984 Long term (current) use of oral hypoglycemic drugs: Secondary | ICD-10-CM | POA: Diagnosis not present

## 2018-04-11 DIAGNOSIS — Z792 Long term (current) use of antibiotics: Secondary | ICD-10-CM | POA: Diagnosis not present

## 2018-04-11 DIAGNOSIS — L03116 Cellulitis of left lower limb: Secondary | ICD-10-CM | POA: Diagnosis not present

## 2018-04-11 DIAGNOSIS — Z7902 Long term (current) use of antithrombotics/antiplatelets: Secondary | ICD-10-CM | POA: Diagnosis not present

## 2018-04-11 DIAGNOSIS — T8744 Infection of amputation stump, left lower extremity: Secondary | ICD-10-CM | POA: Diagnosis not present

## 2018-04-14 DIAGNOSIS — Z8631 Personal history of diabetic foot ulcer: Secondary | ICD-10-CM | POA: Diagnosis not present

## 2018-04-14 DIAGNOSIS — L03116 Cellulitis of left lower limb: Secondary | ICD-10-CM | POA: Diagnosis not present

## 2018-04-14 DIAGNOSIS — Z7902 Long term (current) use of antithrombotics/antiplatelets: Secondary | ICD-10-CM | POA: Diagnosis not present

## 2018-04-14 DIAGNOSIS — T8744 Infection of amputation stump, left lower extremity: Secondary | ICD-10-CM | POA: Diagnosis not present

## 2018-04-14 DIAGNOSIS — Z87891 Personal history of nicotine dependence: Secondary | ICD-10-CM | POA: Diagnosis not present

## 2018-04-14 DIAGNOSIS — Z7984 Long term (current) use of oral hypoglycemic drugs: Secondary | ICD-10-CM | POA: Diagnosis not present

## 2018-04-14 DIAGNOSIS — Z792 Long term (current) use of antibiotics: Secondary | ICD-10-CM | POA: Diagnosis not present

## 2018-04-14 DIAGNOSIS — B9689 Other specified bacterial agents as the cause of diseases classified elsewhere: Secondary | ICD-10-CM | POA: Diagnosis not present

## 2018-04-14 DIAGNOSIS — E1152 Type 2 diabetes mellitus with diabetic peripheral angiopathy with gangrene: Secondary | ICD-10-CM | POA: Diagnosis not present

## 2018-04-16 DIAGNOSIS — Z8631 Personal history of diabetic foot ulcer: Secondary | ICD-10-CM | POA: Diagnosis not present

## 2018-04-16 DIAGNOSIS — Z87891 Personal history of nicotine dependence: Secondary | ICD-10-CM | POA: Diagnosis not present

## 2018-04-16 DIAGNOSIS — Z7984 Long term (current) use of oral hypoglycemic drugs: Secondary | ICD-10-CM | POA: Diagnosis not present

## 2018-04-16 DIAGNOSIS — L03116 Cellulitis of left lower limb: Secondary | ICD-10-CM | POA: Diagnosis not present

## 2018-04-16 DIAGNOSIS — B9689 Other specified bacterial agents as the cause of diseases classified elsewhere: Secondary | ICD-10-CM | POA: Diagnosis not present

## 2018-04-16 DIAGNOSIS — Z792 Long term (current) use of antibiotics: Secondary | ICD-10-CM | POA: Diagnosis not present

## 2018-04-16 DIAGNOSIS — Z7902 Long term (current) use of antithrombotics/antiplatelets: Secondary | ICD-10-CM | POA: Diagnosis not present

## 2018-04-16 DIAGNOSIS — T8744 Infection of amputation stump, left lower extremity: Secondary | ICD-10-CM | POA: Diagnosis not present

## 2018-04-16 DIAGNOSIS — E1152 Type 2 diabetes mellitus with diabetic peripheral angiopathy with gangrene: Secondary | ICD-10-CM | POA: Diagnosis not present

## 2018-04-17 DIAGNOSIS — E1165 Type 2 diabetes mellitus with hyperglycemia: Secondary | ICD-10-CM | POA: Diagnosis not present

## 2018-04-17 DIAGNOSIS — I739 Peripheral vascular disease, unspecified: Secondary | ICD-10-CM | POA: Diagnosis not present

## 2018-04-17 DIAGNOSIS — E782 Mixed hyperlipidemia: Secondary | ICD-10-CM | POA: Diagnosis not present

## 2018-04-17 DIAGNOSIS — I10 Essential (primary) hypertension: Secondary | ICD-10-CM | POA: Diagnosis not present

## 2018-04-18 DIAGNOSIS — B9689 Other specified bacterial agents as the cause of diseases classified elsewhere: Secondary | ICD-10-CM | POA: Diagnosis not present

## 2018-04-18 DIAGNOSIS — Z87891 Personal history of nicotine dependence: Secondary | ICD-10-CM | POA: Diagnosis not present

## 2018-04-18 DIAGNOSIS — T8744 Infection of amputation stump, left lower extremity: Secondary | ICD-10-CM | POA: Diagnosis not present

## 2018-04-18 DIAGNOSIS — L03116 Cellulitis of left lower limb: Secondary | ICD-10-CM | POA: Diagnosis not present

## 2018-04-18 DIAGNOSIS — Z7902 Long term (current) use of antithrombotics/antiplatelets: Secondary | ICD-10-CM | POA: Diagnosis not present

## 2018-04-18 DIAGNOSIS — E1152 Type 2 diabetes mellitus with diabetic peripheral angiopathy with gangrene: Secondary | ICD-10-CM | POA: Diagnosis not present

## 2018-04-18 DIAGNOSIS — Z8631 Personal history of diabetic foot ulcer: Secondary | ICD-10-CM | POA: Diagnosis not present

## 2018-04-18 DIAGNOSIS — Z7984 Long term (current) use of oral hypoglycemic drugs: Secondary | ICD-10-CM | POA: Diagnosis not present

## 2018-04-18 DIAGNOSIS — Z792 Long term (current) use of antibiotics: Secondary | ICD-10-CM | POA: Diagnosis not present

## 2018-04-21 DIAGNOSIS — Z7902 Long term (current) use of antithrombotics/antiplatelets: Secondary | ICD-10-CM | POA: Diagnosis not present

## 2018-04-21 DIAGNOSIS — Z87891 Personal history of nicotine dependence: Secondary | ICD-10-CM | POA: Diagnosis not present

## 2018-04-21 DIAGNOSIS — E1152 Type 2 diabetes mellitus with diabetic peripheral angiopathy with gangrene: Secondary | ICD-10-CM | POA: Diagnosis not present

## 2018-04-21 DIAGNOSIS — Z8631 Personal history of diabetic foot ulcer: Secondary | ICD-10-CM | POA: Diagnosis not present

## 2018-04-21 DIAGNOSIS — L03116 Cellulitis of left lower limb: Secondary | ICD-10-CM | POA: Diagnosis not present

## 2018-04-21 DIAGNOSIS — B9689 Other specified bacterial agents as the cause of diseases classified elsewhere: Secondary | ICD-10-CM | POA: Diagnosis not present

## 2018-04-21 DIAGNOSIS — Z7984 Long term (current) use of oral hypoglycemic drugs: Secondary | ICD-10-CM | POA: Diagnosis not present

## 2018-04-21 DIAGNOSIS — T8744 Infection of amputation stump, left lower extremity: Secondary | ICD-10-CM | POA: Diagnosis not present

## 2018-04-21 DIAGNOSIS — Z792 Long term (current) use of antibiotics: Secondary | ICD-10-CM | POA: Diagnosis not present

## 2018-04-23 DIAGNOSIS — L03116 Cellulitis of left lower limb: Secondary | ICD-10-CM | POA: Diagnosis not present

## 2018-04-23 DIAGNOSIS — T8744 Infection of amputation stump, left lower extremity: Secondary | ICD-10-CM | POA: Diagnosis not present

## 2018-04-23 DIAGNOSIS — Z792 Long term (current) use of antibiotics: Secondary | ICD-10-CM | POA: Diagnosis not present

## 2018-04-23 DIAGNOSIS — Z87891 Personal history of nicotine dependence: Secondary | ICD-10-CM | POA: Diagnosis not present

## 2018-04-23 DIAGNOSIS — Z8631 Personal history of diabetic foot ulcer: Secondary | ICD-10-CM | POA: Diagnosis not present

## 2018-04-23 DIAGNOSIS — Z7984 Long term (current) use of oral hypoglycemic drugs: Secondary | ICD-10-CM | POA: Diagnosis not present

## 2018-04-23 DIAGNOSIS — Z7902 Long term (current) use of antithrombotics/antiplatelets: Secondary | ICD-10-CM | POA: Diagnosis not present

## 2018-04-23 DIAGNOSIS — E1152 Type 2 diabetes mellitus with diabetic peripheral angiopathy with gangrene: Secondary | ICD-10-CM | POA: Diagnosis not present

## 2018-04-23 DIAGNOSIS — B9689 Other specified bacterial agents as the cause of diseases classified elsewhere: Secondary | ICD-10-CM | POA: Diagnosis not present

## 2018-04-24 DIAGNOSIS — Z792 Long term (current) use of antibiotics: Secondary | ICD-10-CM | POA: Diagnosis not present

## 2018-04-24 DIAGNOSIS — Z8631 Personal history of diabetic foot ulcer: Secondary | ICD-10-CM | POA: Diagnosis not present

## 2018-04-24 DIAGNOSIS — L03116 Cellulitis of left lower limb: Secondary | ICD-10-CM | POA: Diagnosis not present

## 2018-04-24 DIAGNOSIS — Z87891 Personal history of nicotine dependence: Secondary | ICD-10-CM | POA: Diagnosis not present

## 2018-04-24 DIAGNOSIS — E1152 Type 2 diabetes mellitus with diabetic peripheral angiopathy with gangrene: Secondary | ICD-10-CM | POA: Diagnosis not present

## 2018-04-24 DIAGNOSIS — Z7984 Long term (current) use of oral hypoglycemic drugs: Secondary | ICD-10-CM | POA: Diagnosis not present

## 2018-04-24 DIAGNOSIS — T8744 Infection of amputation stump, left lower extremity: Secondary | ICD-10-CM | POA: Diagnosis not present

## 2018-04-24 DIAGNOSIS — B9689 Other specified bacterial agents as the cause of diseases classified elsewhere: Secondary | ICD-10-CM | POA: Diagnosis not present

## 2018-04-24 DIAGNOSIS — Z7902 Long term (current) use of antithrombotics/antiplatelets: Secondary | ICD-10-CM | POA: Diagnosis not present

## 2018-04-25 DIAGNOSIS — E1152 Type 2 diabetes mellitus with diabetic peripheral angiopathy with gangrene: Secondary | ICD-10-CM | POA: Diagnosis not present

## 2018-04-25 DIAGNOSIS — Z7902 Long term (current) use of antithrombotics/antiplatelets: Secondary | ICD-10-CM | POA: Diagnosis not present

## 2018-04-25 DIAGNOSIS — T8744 Infection of amputation stump, left lower extremity: Secondary | ICD-10-CM | POA: Diagnosis not present

## 2018-04-25 DIAGNOSIS — Z7984 Long term (current) use of oral hypoglycemic drugs: Secondary | ICD-10-CM | POA: Diagnosis not present

## 2018-04-25 DIAGNOSIS — Z8631 Personal history of diabetic foot ulcer: Secondary | ICD-10-CM | POA: Diagnosis not present

## 2018-04-25 DIAGNOSIS — Z792 Long term (current) use of antibiotics: Secondary | ICD-10-CM | POA: Diagnosis not present

## 2018-04-25 DIAGNOSIS — Z87891 Personal history of nicotine dependence: Secondary | ICD-10-CM | POA: Diagnosis not present

## 2018-04-25 DIAGNOSIS — L03116 Cellulitis of left lower limb: Secondary | ICD-10-CM | POA: Diagnosis not present

## 2018-04-25 DIAGNOSIS — B9689 Other specified bacterial agents as the cause of diseases classified elsewhere: Secondary | ICD-10-CM | POA: Diagnosis not present

## 2018-04-28 ENCOUNTER — Ambulatory Visit (INDEPENDENT_AMBULATORY_CARE_PROVIDER_SITE_OTHER): Payer: Medicare Other

## 2018-04-28 ENCOUNTER — Encounter (INDEPENDENT_AMBULATORY_CARE_PROVIDER_SITE_OTHER): Payer: Self-pay | Admitting: Vascular Surgery

## 2018-04-28 ENCOUNTER — Ambulatory Visit (INDEPENDENT_AMBULATORY_CARE_PROVIDER_SITE_OTHER): Payer: Medicare Other | Admitting: Vascular Surgery

## 2018-04-28 ENCOUNTER — Other Ambulatory Visit (INDEPENDENT_AMBULATORY_CARE_PROVIDER_SITE_OTHER): Payer: Self-pay | Admitting: Vascular Surgery

## 2018-04-28 VITALS — BP 159/76 | HR 82 | Resp 16 | Ht 69.0 in | Wt 165.0 lb

## 2018-04-28 DIAGNOSIS — I7025 Atherosclerosis of native arteries of other extremities with ulceration: Secondary | ICD-10-CM

## 2018-04-28 DIAGNOSIS — Z9862 Peripheral vascular angioplasty status: Secondary | ICD-10-CM

## 2018-04-28 DIAGNOSIS — I10 Essential (primary) hypertension: Secondary | ICD-10-CM | POA: Insufficient documentation

## 2018-04-28 DIAGNOSIS — Z7984 Long term (current) use of oral hypoglycemic drugs: Secondary | ICD-10-CM | POA: Diagnosis not present

## 2018-04-28 DIAGNOSIS — E1152 Type 2 diabetes mellitus with diabetic peripheral angiopathy with gangrene: Secondary | ICD-10-CM | POA: Diagnosis not present

## 2018-04-28 DIAGNOSIS — E119 Type 2 diabetes mellitus without complications: Secondary | ICD-10-CM | POA: Insufficient documentation

## 2018-04-28 DIAGNOSIS — L089 Local infection of the skin and subcutaneous tissue, unspecified: Secondary | ICD-10-CM | POA: Diagnosis not present

## 2018-04-28 DIAGNOSIS — T8744 Infection of amputation stump, left lower extremity: Secondary | ICD-10-CM | POA: Diagnosis not present

## 2018-04-28 DIAGNOSIS — Z792 Long term (current) use of antibiotics: Secondary | ICD-10-CM | POA: Diagnosis not present

## 2018-04-28 DIAGNOSIS — Z7902 Long term (current) use of antithrombotics/antiplatelets: Secondary | ICD-10-CM | POA: Diagnosis not present

## 2018-04-28 DIAGNOSIS — E11628 Type 2 diabetes mellitus with other skin complications: Secondary | ICD-10-CM | POA: Diagnosis not present

## 2018-04-28 DIAGNOSIS — I70249 Atherosclerosis of native arteries of left leg with ulceration of unspecified site: Secondary | ICD-10-CM | POA: Diagnosis not present

## 2018-04-28 DIAGNOSIS — Z87891 Personal history of nicotine dependence: Secondary | ICD-10-CM | POA: Diagnosis not present

## 2018-04-28 DIAGNOSIS — B9689 Other specified bacterial agents as the cause of diseases classified elsewhere: Secondary | ICD-10-CM | POA: Diagnosis not present

## 2018-04-28 DIAGNOSIS — L03116 Cellulitis of left lower limb: Secondary | ICD-10-CM | POA: Diagnosis not present

## 2018-04-28 DIAGNOSIS — Z8631 Personal history of diabetic foot ulcer: Secondary | ICD-10-CM | POA: Diagnosis not present

## 2018-04-28 NOTE — Progress Notes (Signed)
MRN : [id number]  [name] is a 77 y.o. (Oct 03, 1940) male who presents with chief complaint of No chief complaint on file. Marland Kitchen.  History of Present Illness:   The patient returns to the office for followup and review of the noninvasive studies.   Procedure performed 03/26/2018: 1. Introduction catheter into left lower extremity 3rd order catheter placement with additional third order catheter placement 2. Contrast injection left lower extremity for distal runoff   3. Crosser atherectomy of the left posterior tibial artery and crosser atherectomy of the left peroneal 4. Percutaneous transluminal angioplasty left posterior tibial artery with a 3 mm balloon and the left peroneal artery with a 2.5 mm balloon             5.  Percutaneous transluminal angioplasty of the SFA and popliteal arteries with a 6 mm Lutonix drug-eluting balloon                      6.   Star close closure right common femoral arteriotomy  There have been no interval changes in lower extremity symptoms. No interval shortening of the patient's claudication distance or development of rest pain symptoms. No new ulcers or wounds have occurred since the last visit.  There have been no significant changes to the patient's overall health care.  The patient denies amaurosis fugax or recent TIA symptoms. There are no recent neurological changes noted. The patient denies history of DVT, PE or superficial thrombophlebitis. The patient denies recent episodes of angina or shortness of breath.   ABI Rt=1.01 and Lt=1.15     No outpatient medications have been marked as taking for the 04/28/18 encounter (Appointment) with Gilda CreaseSchnier, Latina CraverGregory G, MD.    Past Medical History:  Diagnosis Date  . Diabetes mellitus without complication [facility](HCC)     Past Surgical History:  Procedure Laterality Date  . AMPUTATION TOE Left 03/28/2018   Procedure: AMPUTATION TOE/ PARTIAL RAY RESECTION-LEFT 2ND AND  3RD;  Surgeon: Linus Galasline, Todd, DPM;  Location: [facility] ORS;  Service: Podiatry;  Laterality: Left;  . HERNIA REPAIR    . LOWER EXTREMITY ANGIOGRAPHY Left 03/26/2018   Procedure: Lower Extremity Angiography;  Surgeon: Renford DillsSchnier, Gregory G, MD;  Location: [facility] INVASIVE CV LAB;  Service: Cardiovascular;  Laterality: Left;    Social History Social History   Tobacco Use  . Smoking status: Former Smoker    Types: Cigarettes  . Smokeless tobacco: Never Used  Substance Use Topics  . Alcohol use: Never    Frequency: Never  . Drug use: Never    Family History No family history on file.  Allergies  Allergen Reactions  . Flu Virus Vaccine Other (See Comments) and Nausea And Vomiting    Fever, chills, vomiting.      REVIEW OF SYSTEMS (Negative unless checked)  Constitutional: [] Weight loss  [] Fever  [] Chills Cardiac: [] Chest pain   [] Chest pressure   [] Palpitations   [] Shortness of breath when laying flat   [] Shortness of breath with exertion. Vascular:  [x] Pain in legs with walking   [] Pain in legs at rest  [] History of DVT   [] Phlebitis   [] Swelling in legs   [] Varicose veins   [x] healing ulcers Pulmonary:   [] Uses home oxygen   [] Productive cough   [] Hemoptysis   [] Wheeze  [] COPD   [] Asthma Neurologic:  [] Dizziness   [] Seizures   [] History of stroke   [] History of TIA  [] Aphasia   [] Vissual changes   [] Weakness or numbness in  arm   [] Weakness or numbness in leg Musculoskeletal:   [] Joint swelling   [] Joint pain   [] Low back pain Hematologic:  [] Easy bruising  [] Easy bleeding   [] Hypercoagulable state   [] Anemic Gastrointestinal:  [] Diarrhea   [] Vomiting  [] Gastroesophageal reflux/heartburn   [] Difficulty swallowing. Genitourinary:  [] Chronic kidney disease   [] Difficult urination  [] Frequent urination   [] Blood in urine Skin:  [] Rashes   [x] Ulcers  Psychological:  [] History of anxiety   []  History of major depression.  Physical Examination  There were no vitals filed for this visit. There  is no height or weight on file to calculate BMI. Gen: WD/WN, NAD Head: [place]/AT, No temporalis wasting.  Ear/Nose/Throat: Hearing grossly intact, nares w/o erythema or drainage Eyes: PER, EOMI, sclera nonicteric.  Neck: Supple, no large masses.   Pulmonary:  Good air movement, no audible wheezing bilaterally, no use of accessory muscles.  Cardiac: RRR, no JVD Vascular: ulcer left foot noninfected Vessel Right Left  Radial Palpable Palpable  PT Trace Palpable Trace Palpable  DP Not Palpable Not Palpable  Gastrointestinal: Non-distended. No guarding/no peritoneal signs.  Musculoskeletal: M/S 5/5 throughout.  No deformity or atrophy.  Neurologic: CN 2-12 intact. Symmetrical.  Speech is fluent. Motor exam as listed above. Psychiatric: Judgment intact, Mood & affect appropriate for pt's clinical situation. Dermatologic: + rashes + left ulcers noted.  No changes consistent with cellulitis. Lymph : No lichenification or skin changes of chronic lymphedema.  CBC Lab Results  Component Value Date   WBC 10.5 03/30/2018   HGB 10.5 (L) 03/30/2018   HCT 30.0 (L) 03/30/2018   MCV 87.9 03/30/2018   PLT 283 03/30/2018    BMET    Component Value Date/Time   NA 136 03/30/2018 0444   K 4.5 03/30/2018 0444   CL 103 03/30/2018 0444   CO2 25 03/30/2018 0444   GLUCOSE 130 (H) 03/30/2018 0444   BUN 21 03/30/2018 0444   CREATININE 1.22 03/30/2018 0444   CALCIUM 8.6 (L) 03/30/2018 0444   GFRNONAA 55 (L) 03/30/2018 0444   GFRAA >60 03/30/2018 0444   CrCl cannot be calculated (Patient's most recent lab result is older than the maximum 21 days allowed.).  COAG No results found for: INR, PROTIME  Radiology No results found.    Assessment/Plan 1. Atherosclerosis of native arteries of the extremities with ulceration (HCC) Recommend:  The patient is status post successful angiogram with intervention.  The patient reports that the claudication symptoms and leg pain is essentially gone.   The  patient denies lifestyle limiting changes at this point in time.  No further invasive studies, angiography or surgery at this time The patient should continue walking and begin a more formal exercise program.  The patient should continue antiplatelet therapy and aggressive treatment of the lipid abnormalities  Smoking cessation was again discussed  The patient should continue wearing graduated compression socks 10-15 mmHg strength to control the mild edema.  Patient should undergo noninvasive studies as ordered. The patient will follow up with me after the studies.   - VAS [place] LOWER EXTREMITY ARTERIAL DUPLEX; Future  2. Diabetic foot infection (HCC) Continue follow up with Dr Alberteen Spindle  3. Type 2 diabetes mellitus with diabetic peripheral angiopathy and gangrene, without long-term current use of insulin (HCC) Continue hypoglycemic medications as already ordered, these medications have been reviewed and there are no changes at this time.  Hgb A1C to be monitored as already arranged by primary service   4. Essential hypertension Continue antihypertensive  medications as already ordered, these medications have been reviewed and there are no changes at this time.     Levora DredgeGregory Schnier, MD  04/28/2018 11:33 AM

## 2018-04-30 DIAGNOSIS — L03116 Cellulitis of left lower limb: Secondary | ICD-10-CM | POA: Diagnosis not present

## 2018-04-30 DIAGNOSIS — Z8631 Personal history of diabetic foot ulcer: Secondary | ICD-10-CM | POA: Diagnosis not present

## 2018-04-30 DIAGNOSIS — Z792 Long term (current) use of antibiotics: Secondary | ICD-10-CM | POA: Diagnosis not present

## 2018-04-30 DIAGNOSIS — T8744 Infection of amputation stump, left lower extremity: Secondary | ICD-10-CM | POA: Diagnosis not present

## 2018-04-30 DIAGNOSIS — Z7902 Long term (current) use of antithrombotics/antiplatelets: Secondary | ICD-10-CM | POA: Diagnosis not present

## 2018-04-30 DIAGNOSIS — Z87891 Personal history of nicotine dependence: Secondary | ICD-10-CM | POA: Diagnosis not present

## 2018-04-30 DIAGNOSIS — E1152 Type 2 diabetes mellitus with diabetic peripheral angiopathy with gangrene: Secondary | ICD-10-CM | POA: Diagnosis not present

## 2018-04-30 DIAGNOSIS — B9689 Other specified bacterial agents as the cause of diseases classified elsewhere: Secondary | ICD-10-CM | POA: Diagnosis not present

## 2018-04-30 DIAGNOSIS — Z7984 Long term (current) use of oral hypoglycemic drugs: Secondary | ICD-10-CM | POA: Diagnosis not present

## 2018-05-01 DIAGNOSIS — I96 Gangrene, not elsewhere classified: Secondary | ICD-10-CM | POA: Diagnosis not present

## 2018-05-02 DIAGNOSIS — Z87891 Personal history of nicotine dependence: Secondary | ICD-10-CM | POA: Diagnosis not present

## 2018-05-02 DIAGNOSIS — Z7902 Long term (current) use of antithrombotics/antiplatelets: Secondary | ICD-10-CM | POA: Diagnosis not present

## 2018-05-02 DIAGNOSIS — T8744 Infection of amputation stump, left lower extremity: Secondary | ICD-10-CM | POA: Diagnosis not present

## 2018-05-02 DIAGNOSIS — B9689 Other specified bacterial agents as the cause of diseases classified elsewhere: Secondary | ICD-10-CM | POA: Diagnosis not present

## 2018-05-02 DIAGNOSIS — Z792 Long term (current) use of antibiotics: Secondary | ICD-10-CM | POA: Diagnosis not present

## 2018-05-02 DIAGNOSIS — L03116 Cellulitis of left lower limb: Secondary | ICD-10-CM | POA: Diagnosis not present

## 2018-05-02 DIAGNOSIS — Z7984 Long term (current) use of oral hypoglycemic drugs: Secondary | ICD-10-CM | POA: Diagnosis not present

## 2018-05-02 DIAGNOSIS — Z8631 Personal history of diabetic foot ulcer: Secondary | ICD-10-CM | POA: Diagnosis not present

## 2018-05-02 DIAGNOSIS — E1152 Type 2 diabetes mellitus with diabetic peripheral angiopathy with gangrene: Secondary | ICD-10-CM | POA: Diagnosis not present

## 2018-05-05 DIAGNOSIS — Z8631 Personal history of diabetic foot ulcer: Secondary | ICD-10-CM | POA: Diagnosis not present

## 2018-05-05 DIAGNOSIS — B9689 Other specified bacterial agents as the cause of diseases classified elsewhere: Secondary | ICD-10-CM | POA: Diagnosis not present

## 2018-05-05 DIAGNOSIS — Z7984 Long term (current) use of oral hypoglycemic drugs: Secondary | ICD-10-CM | POA: Diagnosis not present

## 2018-05-05 DIAGNOSIS — T8744 Infection of amputation stump, left lower extremity: Secondary | ICD-10-CM | POA: Diagnosis not present

## 2018-05-05 DIAGNOSIS — Z87891 Personal history of nicotine dependence: Secondary | ICD-10-CM | POA: Diagnosis not present

## 2018-05-05 DIAGNOSIS — E1152 Type 2 diabetes mellitus with diabetic peripheral angiopathy with gangrene: Secondary | ICD-10-CM | POA: Diagnosis not present

## 2018-05-05 DIAGNOSIS — L03116 Cellulitis of left lower limb: Secondary | ICD-10-CM | POA: Diagnosis not present

## 2018-05-05 DIAGNOSIS — Z7902 Long term (current) use of antithrombotics/antiplatelets: Secondary | ICD-10-CM | POA: Diagnosis not present

## 2018-05-05 DIAGNOSIS — Z792 Long term (current) use of antibiotics: Secondary | ICD-10-CM | POA: Diagnosis not present

## 2018-05-07 DIAGNOSIS — L03116 Cellulitis of left lower limb: Secondary | ICD-10-CM | POA: Diagnosis not present

## 2018-05-07 DIAGNOSIS — Z8631 Personal history of diabetic foot ulcer: Secondary | ICD-10-CM | POA: Diagnosis not present

## 2018-05-07 DIAGNOSIS — Z792 Long term (current) use of antibiotics: Secondary | ICD-10-CM | POA: Diagnosis not present

## 2018-05-07 DIAGNOSIS — Z7902 Long term (current) use of antithrombotics/antiplatelets: Secondary | ICD-10-CM | POA: Diagnosis not present

## 2018-05-07 DIAGNOSIS — Z87891 Personal history of nicotine dependence: Secondary | ICD-10-CM | POA: Diagnosis not present

## 2018-05-07 DIAGNOSIS — E1152 Type 2 diabetes mellitus with diabetic peripheral angiopathy with gangrene: Secondary | ICD-10-CM | POA: Diagnosis not present

## 2018-05-07 DIAGNOSIS — Z7984 Long term (current) use of oral hypoglycemic drugs: Secondary | ICD-10-CM | POA: Diagnosis not present

## 2018-05-07 DIAGNOSIS — T8744 Infection of amputation stump, left lower extremity: Secondary | ICD-10-CM | POA: Diagnosis not present

## 2018-05-07 DIAGNOSIS — B9689 Other specified bacterial agents as the cause of diseases classified elsewhere: Secondary | ICD-10-CM | POA: Diagnosis not present

## 2018-05-09 DIAGNOSIS — Z792 Long term (current) use of antibiotics: Secondary | ICD-10-CM | POA: Diagnosis not present

## 2018-05-09 DIAGNOSIS — B9689 Other specified bacterial agents as the cause of diseases classified elsewhere: Secondary | ICD-10-CM | POA: Diagnosis not present

## 2018-05-09 DIAGNOSIS — Z8631 Personal history of diabetic foot ulcer: Secondary | ICD-10-CM | POA: Diagnosis not present

## 2018-05-09 DIAGNOSIS — Z7984 Long term (current) use of oral hypoglycemic drugs: Secondary | ICD-10-CM | POA: Diagnosis not present

## 2018-05-09 DIAGNOSIS — Z87891 Personal history of nicotine dependence: Secondary | ICD-10-CM | POA: Diagnosis not present

## 2018-05-09 DIAGNOSIS — T8744 Infection of amputation stump, left lower extremity: Secondary | ICD-10-CM | POA: Diagnosis not present

## 2018-05-09 DIAGNOSIS — L03116 Cellulitis of left lower limb: Secondary | ICD-10-CM | POA: Diagnosis not present

## 2018-05-09 DIAGNOSIS — Z7902 Long term (current) use of antithrombotics/antiplatelets: Secondary | ICD-10-CM | POA: Diagnosis not present

## 2018-05-09 DIAGNOSIS — E1152 Type 2 diabetes mellitus with diabetic peripheral angiopathy with gangrene: Secondary | ICD-10-CM | POA: Diagnosis not present

## 2018-05-12 DIAGNOSIS — Z87891 Personal history of nicotine dependence: Secondary | ICD-10-CM | POA: Diagnosis not present

## 2018-05-12 DIAGNOSIS — E1152 Type 2 diabetes mellitus with diabetic peripheral angiopathy with gangrene: Secondary | ICD-10-CM | POA: Diagnosis not present

## 2018-05-12 DIAGNOSIS — T8744 Infection of amputation stump, left lower extremity: Secondary | ICD-10-CM | POA: Diagnosis not present

## 2018-05-12 DIAGNOSIS — B9689 Other specified bacterial agents as the cause of diseases classified elsewhere: Secondary | ICD-10-CM | POA: Diagnosis not present

## 2018-05-12 DIAGNOSIS — Z8631 Personal history of diabetic foot ulcer: Secondary | ICD-10-CM | POA: Diagnosis not present

## 2018-05-12 DIAGNOSIS — L03116 Cellulitis of left lower limb: Secondary | ICD-10-CM | POA: Diagnosis not present

## 2018-05-12 DIAGNOSIS — Z792 Long term (current) use of antibiotics: Secondary | ICD-10-CM | POA: Diagnosis not present

## 2018-05-12 DIAGNOSIS — Z7984 Long term (current) use of oral hypoglycemic drugs: Secondary | ICD-10-CM | POA: Diagnosis not present

## 2018-05-12 DIAGNOSIS — Z7902 Long term (current) use of antithrombotics/antiplatelets: Secondary | ICD-10-CM | POA: Diagnosis not present

## 2018-05-14 DIAGNOSIS — Z87891 Personal history of nicotine dependence: Secondary | ICD-10-CM | POA: Diagnosis not present

## 2018-05-14 DIAGNOSIS — E1152 Type 2 diabetes mellitus with diabetic peripheral angiopathy with gangrene: Secondary | ICD-10-CM | POA: Diagnosis not present

## 2018-05-14 DIAGNOSIS — Z7902 Long term (current) use of antithrombotics/antiplatelets: Secondary | ICD-10-CM | POA: Diagnosis not present

## 2018-05-14 DIAGNOSIS — B9689 Other specified bacterial agents as the cause of diseases classified elsewhere: Secondary | ICD-10-CM | POA: Diagnosis not present

## 2018-05-14 DIAGNOSIS — T8744 Infection of amputation stump, left lower extremity: Secondary | ICD-10-CM | POA: Diagnosis not present

## 2018-05-14 DIAGNOSIS — Z7984 Long term (current) use of oral hypoglycemic drugs: Secondary | ICD-10-CM | POA: Diagnosis not present

## 2018-05-14 DIAGNOSIS — Z792 Long term (current) use of antibiotics: Secondary | ICD-10-CM | POA: Diagnosis not present

## 2018-05-14 DIAGNOSIS — Z8631 Personal history of diabetic foot ulcer: Secondary | ICD-10-CM | POA: Diagnosis not present

## 2018-05-14 DIAGNOSIS — L03116 Cellulitis of left lower limb: Secondary | ICD-10-CM | POA: Diagnosis not present

## 2018-05-16 DIAGNOSIS — Z7902 Long term (current) use of antithrombotics/antiplatelets: Secondary | ICD-10-CM | POA: Diagnosis not present

## 2018-05-16 DIAGNOSIS — Z7984 Long term (current) use of oral hypoglycemic drugs: Secondary | ICD-10-CM | POA: Diagnosis not present

## 2018-05-16 DIAGNOSIS — B9689 Other specified bacterial agents as the cause of diseases classified elsewhere: Secondary | ICD-10-CM | POA: Diagnosis not present

## 2018-05-16 DIAGNOSIS — Z792 Long term (current) use of antibiotics: Secondary | ICD-10-CM | POA: Diagnosis not present

## 2018-05-16 DIAGNOSIS — L03116 Cellulitis of left lower limb: Secondary | ICD-10-CM | POA: Diagnosis not present

## 2018-05-16 DIAGNOSIS — T8744 Infection of amputation stump, left lower extremity: Secondary | ICD-10-CM | POA: Diagnosis not present

## 2018-05-16 DIAGNOSIS — E1152 Type 2 diabetes mellitus with diabetic peripheral angiopathy with gangrene: Secondary | ICD-10-CM | POA: Diagnosis not present

## 2018-05-16 DIAGNOSIS — Z87891 Personal history of nicotine dependence: Secondary | ICD-10-CM | POA: Diagnosis not present

## 2018-05-16 DIAGNOSIS — Z8631 Personal history of diabetic foot ulcer: Secondary | ICD-10-CM | POA: Diagnosis not present

## 2018-05-20 DIAGNOSIS — E1152 Type 2 diabetes mellitus with diabetic peripheral angiopathy with gangrene: Secondary | ICD-10-CM | POA: Diagnosis not present

## 2018-05-20 DIAGNOSIS — Z792 Long term (current) use of antibiotics: Secondary | ICD-10-CM | POA: Diagnosis not present

## 2018-05-20 DIAGNOSIS — B9689 Other specified bacterial agents as the cause of diseases classified elsewhere: Secondary | ICD-10-CM | POA: Diagnosis not present

## 2018-05-20 DIAGNOSIS — Z7984 Long term (current) use of oral hypoglycemic drugs: Secondary | ICD-10-CM | POA: Diagnosis not present

## 2018-05-20 DIAGNOSIS — L03116 Cellulitis of left lower limb: Secondary | ICD-10-CM | POA: Diagnosis not present

## 2018-05-20 DIAGNOSIS — Z87891 Personal history of nicotine dependence: Secondary | ICD-10-CM | POA: Diagnosis not present

## 2018-05-20 DIAGNOSIS — Z8631 Personal history of diabetic foot ulcer: Secondary | ICD-10-CM | POA: Diagnosis not present

## 2018-05-20 DIAGNOSIS — Z7902 Long term (current) use of antithrombotics/antiplatelets: Secondary | ICD-10-CM | POA: Diagnosis not present

## 2018-05-20 DIAGNOSIS — T8744 Infection of amputation stump, left lower extremity: Secondary | ICD-10-CM | POA: Diagnosis not present

## 2018-05-23 DIAGNOSIS — T8744 Infection of amputation stump, left lower extremity: Secondary | ICD-10-CM | POA: Diagnosis not present

## 2018-05-23 DIAGNOSIS — Z7984 Long term (current) use of oral hypoglycemic drugs: Secondary | ICD-10-CM | POA: Diagnosis not present

## 2018-05-23 DIAGNOSIS — L03116 Cellulitis of left lower limb: Secondary | ICD-10-CM | POA: Diagnosis not present

## 2018-05-23 DIAGNOSIS — Z7902 Long term (current) use of antithrombotics/antiplatelets: Secondary | ICD-10-CM | POA: Diagnosis not present

## 2018-05-23 DIAGNOSIS — B9689 Other specified bacterial agents as the cause of diseases classified elsewhere: Secondary | ICD-10-CM | POA: Diagnosis not present

## 2018-05-23 DIAGNOSIS — E1152 Type 2 diabetes mellitus with diabetic peripheral angiopathy with gangrene: Secondary | ICD-10-CM | POA: Diagnosis not present

## 2018-05-23 DIAGNOSIS — Z8631 Personal history of diabetic foot ulcer: Secondary | ICD-10-CM | POA: Diagnosis not present

## 2018-05-23 DIAGNOSIS — Z792 Long term (current) use of antibiotics: Secondary | ICD-10-CM | POA: Diagnosis not present

## 2018-05-23 DIAGNOSIS — Z87891 Personal history of nicotine dependence: Secondary | ICD-10-CM | POA: Diagnosis not present

## 2018-05-26 DIAGNOSIS — E1152 Type 2 diabetes mellitus with diabetic peripheral angiopathy with gangrene: Secondary | ICD-10-CM | POA: Diagnosis not present

## 2018-05-26 DIAGNOSIS — L03116 Cellulitis of left lower limb: Secondary | ICD-10-CM | POA: Diagnosis not present

## 2018-05-26 DIAGNOSIS — Z7902 Long term (current) use of antithrombotics/antiplatelets: Secondary | ICD-10-CM | POA: Diagnosis not present

## 2018-05-26 DIAGNOSIS — T8744 Infection of amputation stump, left lower extremity: Secondary | ICD-10-CM | POA: Diagnosis not present

## 2018-05-26 DIAGNOSIS — Z792 Long term (current) use of antibiotics: Secondary | ICD-10-CM | POA: Diagnosis not present

## 2018-05-26 DIAGNOSIS — Z87891 Personal history of nicotine dependence: Secondary | ICD-10-CM | POA: Diagnosis not present

## 2018-05-26 DIAGNOSIS — Z8631 Personal history of diabetic foot ulcer: Secondary | ICD-10-CM | POA: Diagnosis not present

## 2018-05-26 DIAGNOSIS — B9689 Other specified bacterial agents as the cause of diseases classified elsewhere: Secondary | ICD-10-CM | POA: Diagnosis not present

## 2018-05-26 DIAGNOSIS — Z7984 Long term (current) use of oral hypoglycemic drugs: Secondary | ICD-10-CM | POA: Diagnosis not present

## 2018-05-28 DIAGNOSIS — L03116 Cellulitis of left lower limb: Secondary | ICD-10-CM | POA: Diagnosis not present

## 2018-05-28 DIAGNOSIS — Z8631 Personal history of diabetic foot ulcer: Secondary | ICD-10-CM | POA: Diagnosis not present

## 2018-05-28 DIAGNOSIS — Z7902 Long term (current) use of antithrombotics/antiplatelets: Secondary | ICD-10-CM | POA: Diagnosis not present

## 2018-05-28 DIAGNOSIS — Z792 Long term (current) use of antibiotics: Secondary | ICD-10-CM | POA: Diagnosis not present

## 2018-05-28 DIAGNOSIS — T8744 Infection of amputation stump, left lower extremity: Secondary | ICD-10-CM | POA: Diagnosis not present

## 2018-05-28 DIAGNOSIS — E1152 Type 2 diabetes mellitus with diabetic peripheral angiopathy with gangrene: Secondary | ICD-10-CM | POA: Diagnosis not present

## 2018-05-28 DIAGNOSIS — B9689 Other specified bacterial agents as the cause of diseases classified elsewhere: Secondary | ICD-10-CM | POA: Diagnosis not present

## 2018-05-28 DIAGNOSIS — Z7984 Long term (current) use of oral hypoglycemic drugs: Secondary | ICD-10-CM | POA: Diagnosis not present

## 2018-05-28 DIAGNOSIS — Z87891 Personal history of nicotine dependence: Secondary | ICD-10-CM | POA: Diagnosis not present

## 2018-05-30 DIAGNOSIS — T8744 Infection of amputation stump, left lower extremity: Secondary | ICD-10-CM | POA: Diagnosis not present

## 2018-05-30 DIAGNOSIS — E1151 Type 2 diabetes mellitus with diabetic peripheral angiopathy without gangrene: Secondary | ICD-10-CM | POA: Diagnosis not present

## 2018-05-30 DIAGNOSIS — Z87891 Personal history of nicotine dependence: Secondary | ICD-10-CM | POA: Diagnosis not present

## 2018-05-30 DIAGNOSIS — Z7984 Long term (current) use of oral hypoglycemic drugs: Secondary | ICD-10-CM | POA: Diagnosis not present

## 2018-05-30 DIAGNOSIS — T8781 Dehiscence of amputation stump: Secondary | ICD-10-CM | POA: Diagnosis not present

## 2018-05-30 DIAGNOSIS — Z8631 Personal history of diabetic foot ulcer: Secondary | ICD-10-CM | POA: Diagnosis not present

## 2018-05-30 DIAGNOSIS — Z7902 Long term (current) use of antithrombotics/antiplatelets: Secondary | ICD-10-CM | POA: Diagnosis not present

## 2018-06-02 DIAGNOSIS — Z8631 Personal history of diabetic foot ulcer: Secondary | ICD-10-CM | POA: Diagnosis not present

## 2018-06-02 DIAGNOSIS — T8744 Infection of amputation stump, left lower extremity: Secondary | ICD-10-CM | POA: Diagnosis not present

## 2018-06-02 DIAGNOSIS — Z7984 Long term (current) use of oral hypoglycemic drugs: Secondary | ICD-10-CM | POA: Diagnosis not present

## 2018-06-02 DIAGNOSIS — E1151 Type 2 diabetes mellitus with diabetic peripheral angiopathy without gangrene: Secondary | ICD-10-CM | POA: Diagnosis not present

## 2018-06-02 DIAGNOSIS — Z87891 Personal history of nicotine dependence: Secondary | ICD-10-CM | POA: Diagnosis not present

## 2018-06-02 DIAGNOSIS — Z7902 Long term (current) use of antithrombotics/antiplatelets: Secondary | ICD-10-CM | POA: Diagnosis not present

## 2018-06-02 DIAGNOSIS — T8781 Dehiscence of amputation stump: Secondary | ICD-10-CM | POA: Diagnosis not present

## 2018-06-04 DIAGNOSIS — Z7902 Long term (current) use of antithrombotics/antiplatelets: Secondary | ICD-10-CM | POA: Diagnosis not present

## 2018-06-04 DIAGNOSIS — Z87891 Personal history of nicotine dependence: Secondary | ICD-10-CM | POA: Diagnosis not present

## 2018-06-04 DIAGNOSIS — Z7984 Long term (current) use of oral hypoglycemic drugs: Secondary | ICD-10-CM | POA: Diagnosis not present

## 2018-06-04 DIAGNOSIS — Z8631 Personal history of diabetic foot ulcer: Secondary | ICD-10-CM | POA: Diagnosis not present

## 2018-06-04 DIAGNOSIS — T8744 Infection of amputation stump, left lower extremity: Secondary | ICD-10-CM | POA: Diagnosis not present

## 2018-06-04 DIAGNOSIS — E1151 Type 2 diabetes mellitus with diabetic peripheral angiopathy without gangrene: Secondary | ICD-10-CM | POA: Diagnosis not present

## 2018-06-04 DIAGNOSIS — T8781 Dehiscence of amputation stump: Secondary | ICD-10-CM | POA: Diagnosis not present

## 2018-06-06 DIAGNOSIS — Z87891 Personal history of nicotine dependence: Secondary | ICD-10-CM | POA: Diagnosis not present

## 2018-06-06 DIAGNOSIS — Z8631 Personal history of diabetic foot ulcer: Secondary | ICD-10-CM | POA: Diagnosis not present

## 2018-06-06 DIAGNOSIS — T8781 Dehiscence of amputation stump: Secondary | ICD-10-CM | POA: Diagnosis not present

## 2018-06-06 DIAGNOSIS — E1151 Type 2 diabetes mellitus with diabetic peripheral angiopathy without gangrene: Secondary | ICD-10-CM | POA: Diagnosis not present

## 2018-06-06 DIAGNOSIS — Z7984 Long term (current) use of oral hypoglycemic drugs: Secondary | ICD-10-CM | POA: Diagnosis not present

## 2018-06-06 DIAGNOSIS — Z7902 Long term (current) use of antithrombotics/antiplatelets: Secondary | ICD-10-CM | POA: Diagnosis not present

## 2018-06-06 DIAGNOSIS — T8744 Infection of amputation stump, left lower extremity: Secondary | ICD-10-CM | POA: Diagnosis not present

## 2018-06-09 DIAGNOSIS — Z87891 Personal history of nicotine dependence: Secondary | ICD-10-CM | POA: Diagnosis not present

## 2018-06-09 DIAGNOSIS — T8781 Dehiscence of amputation stump: Secondary | ICD-10-CM | POA: Diagnosis not present

## 2018-06-09 DIAGNOSIS — Z7984 Long term (current) use of oral hypoglycemic drugs: Secondary | ICD-10-CM | POA: Diagnosis not present

## 2018-06-09 DIAGNOSIS — Z8631 Personal history of diabetic foot ulcer: Secondary | ICD-10-CM | POA: Diagnosis not present

## 2018-06-09 DIAGNOSIS — T8744 Infection of amputation stump, left lower extremity: Secondary | ICD-10-CM | POA: Diagnosis not present

## 2018-06-09 DIAGNOSIS — Z7902 Long term (current) use of antithrombotics/antiplatelets: Secondary | ICD-10-CM | POA: Diagnosis not present

## 2018-06-09 DIAGNOSIS — E1151 Type 2 diabetes mellitus with diabetic peripheral angiopathy without gangrene: Secondary | ICD-10-CM | POA: Diagnosis not present

## 2018-06-11 ENCOUNTER — Ambulatory Visit (INDEPENDENT_AMBULATORY_CARE_PROVIDER_SITE_OTHER): Payer: Medicare Other

## 2018-06-11 ENCOUNTER — Encounter (INDEPENDENT_AMBULATORY_CARE_PROVIDER_SITE_OTHER): Payer: Self-pay | Admitting: Nurse Practitioner

## 2018-06-11 ENCOUNTER — Encounter

## 2018-06-11 ENCOUNTER — Ambulatory Visit (INDEPENDENT_AMBULATORY_CARE_PROVIDER_SITE_OTHER): Payer: Medicare Other | Admitting: Nurse Practitioner

## 2018-06-11 ENCOUNTER — Ambulatory Visit: Payer: Self-pay | Admitting: Family Medicine

## 2018-06-11 VITALS — BP 155/75 | HR 75 | Resp 16 | Ht 70.0 in | Wt 165.0 lb

## 2018-06-11 DIAGNOSIS — I7025 Atherosclerosis of native arteries of other extremities with ulceration: Secondary | ICD-10-CM

## 2018-06-11 DIAGNOSIS — I10 Essential (primary) hypertension: Secondary | ICD-10-CM

## 2018-06-11 DIAGNOSIS — Z7984 Long term (current) use of oral hypoglycemic drugs: Secondary | ICD-10-CM

## 2018-06-11 DIAGNOSIS — T8781 Dehiscence of amputation stump: Secondary | ICD-10-CM | POA: Diagnosis not present

## 2018-06-11 DIAGNOSIS — T8744 Infection of amputation stump, left lower extremity: Secondary | ICD-10-CM | POA: Diagnosis not present

## 2018-06-11 DIAGNOSIS — Z8631 Personal history of diabetic foot ulcer: Secondary | ICD-10-CM | POA: Diagnosis not present

## 2018-06-11 DIAGNOSIS — E1151 Type 2 diabetes mellitus with diabetic peripheral angiopathy without gangrene: Secondary | ICD-10-CM | POA: Diagnosis not present

## 2018-06-11 DIAGNOSIS — Z7902 Long term (current) use of antithrombotics/antiplatelets: Secondary | ICD-10-CM | POA: Diagnosis not present

## 2018-06-11 DIAGNOSIS — Z87891 Personal history of nicotine dependence: Secondary | ICD-10-CM | POA: Diagnosis not present

## 2018-06-11 DIAGNOSIS — E1152 Type 2 diabetes mellitus with diabetic peripheral angiopathy with gangrene: Secondary | ICD-10-CM | POA: Diagnosis not present

## 2018-06-11 DIAGNOSIS — Z89422 Acquired absence of other left toe(s): Secondary | ICD-10-CM | POA: Diagnosis not present

## 2018-06-11 NOTE — Progress Notes (Signed)
Subjective:    Patient ID: [name], male    DOB: [birth year], 77 y.o.   MRN: [id number] Chief Complaint  Patient presents with  . Follow-up    LLE Arterial duplex    HPI  [name] is a 77 y.o. male that returns to the office for followup and review of the noninvasive studies. There have been no interval changes in lower extremity symptoms. No interval shortening of the patient's claudication distance or development of rest pain symptoms.  [name] had a left lower extremity angiogram on 03/26/2018, with left second and third toe amputation on 03/28/2018.  The patient reports that his wound is continuing to heal.  Per the patient's wife the wound is much better and has little tunneling.  There have been no significant changes to the patient's overall health care.  The patient denies amaurosis fugax or recent TIA symptoms. There are no recent neurological changes noted. The patient denies history of DVT, PE or superficial thrombophlebitis. The patient denies recent episodes of angina or shortness of breath.   Today [name] had a lower left extremity arterial duplex.  It revealed generally triphasic flow down to the level of the distal popliteal which revealed hyperemic flows.  The posterior tibial proximal showed a 75 to 99% stenosis.  Monophasic flows in the posterior tibial mid and distal.  Hyperemic monophasic flows of the peroneal.  The anterior tibial artery distal appears occluded.  ABIs done on 04/28/2018 reveal an ABI of 1.15 on the left lower extremity with an ABI of 1.01 on the right.    Review of Systems   Review of Systems: Negative Unless Checked Constitutional: [] Weight loss  [] Fever  [] Chills Cardiac: [] Chest pain   ? Atrial Fibrillation  [] Palpitations   [] Shortness of breath when laying flat   [] Shortness of breath with exertion. Vascular:  [] Pain in legs with walking   [] Pain in legs with standing  [] History of DVT   [] Phlebitis   [] Swelling in legs    [] Varicose veins   [] Non-healing ulcers Pulmonary:   [] Uses home oxygen   [] Productive cough   [] Hemoptysis   [] Wheeze  [] COPD   [] Asthma Neurologic:  [] Dizziness   [] Seizures   [] History of stroke   [] History of TIA  [] Aphasia   [] Vissual changes   [] Weakness or numbness in arm   [] Weakness or numbness in leg Musculoskeletal:   [] Joint swelling   [] Joint pain   [] Low back pain  ? History of Knee Replacement Hematologic:  [] Easy bruising  [] Easy bleeding   [] Hypercoagulable state   [] Anemic Gastrointestinal:  [] Diarrhea   [] Vomiting  [] Gastroesophageal reflux/heartburn   [] Difficulty swallowing. Genitourinary:  [] Chronic kidney disease   [] Difficult urination  [] Anuric   [] Blood in urine Skin:  [] Rashes   [] Ulcers  Psychological:  [] History of anxiety   []  History of major depression  ? Memory Difficulties     Objective:   Physical Exam  BP (!) 155/75 (BP Location: Right Arm, Patient Position: Sitting)   Pulse 75   Resp 16   Ht 5\' 10"  (1.778 m)   Wt 165 lb (74.8 kg)   BMI 23.68 kg/m   Past Medical History:  Diagnosis Date  . Diabetes mellitus without complication (HCC)      Gen: WD/WN, NAD Head: [place]/AT, No temporalis wasting.  Ear/Nose/Throat: Hearing grossly intact, nares w/o erythema or drainage Eyes: PER, EOMI, sclera nonicteric.  Neck: Supple, no masses.  No JVD.  Pulmonary:  Good air movement,  no use of accessory muscles.  Cardiac: RRR Vascular:  Unable to palpate pulses on left due to wound dressing. Vessel Right Left  Dorsalis Pedis Palpable   Posterior Tibial Palpable    Gastrointestinal: soft, non-distended. No guarding/no peritoneal signs.  Musculoskeletal: He is a cane while ambulating. No deformity or atrophy.  Neurologic: Pain and light touch intact in extremities.  Symmetrical.  Speech is fluent. Motor exam as listed above. Psychiatric: Judgment intact, Mood & affect appropriate for pt's clinical situation. Dermatologic: No Venous rashes.   No changes  consistent with cellulitis. Lymph : No Cervical lymphadenopathy, no lichenification or skin changes of chronic lymphedema.   Social History   Socioeconomic History  . Marital status: Married    Spouse name: Not on file  . Number of children: Not on file  . Years of education: Not on file  . Highest education level: Not on file  Occupational History  . Not on file  Social Needs  . Financial resource strain: Not on file  . Food insecurity:    Worry: Not on file    Inability: Not on file  . Transportation needs:    Medical: Not on file    Non-medical: Not on file  Tobacco Use  . Smoking status: Former Smoker    Types: Cigarettes  . Smokeless tobacco: Never Used  Substance and Sexual Activity  . Alcohol use: Never    Frequency: Never  . Drug use: Never  . Sexual activity: Never  Lifestyle  . Physical activity:    Days per week: Not on file    Minutes per session: Not on file  . Stress: Not on file  Relationships  . Social connections:    Talks on phone: Not on file    Gets together: Not on file    Attends religious service: Not on file    Active member of club or organization: Not on file    Attends meetings of clubs or organizations: Not on file    Relationship status: Not on file  . Intimate partner violence:    Fear of current or ex partner: Not on file    Emotionally abused: Not on file    Physically abused: Not on file    Forced sexual activity: Not on file  Other Topics Concern  . Not on file  Social History Narrative  . Not on file    Past Surgical History:  Procedure Laterality Date  . AMPUTATION TOE Left 03/28/2018   Procedure: AMPUTATION TOE/ PARTIAL RAY RESECTION-LEFT 2ND AND 3RD;  Surgeon: Linus Galas, DPM;  Location: [facility] ORS;  Service: Podiatry;  Laterality: Left;  . HERNIA REPAIR    . LOWER EXTREMITY ANGIOGRAPHY Left 03/26/2018   Procedure: Lower Extremity Angiography;  Surgeon: Renford Dills, MD;  Location: [facility] INVASIVE CV LAB;  Service:  Cardiovascular;  Laterality: Left;    History reviewed. No pertinent family history.  Allergies  Allergen Reactions  . Flu Virus Vaccine Other (See Comments) and Nausea And Vomiting    Fever, chills, vomiting.        Assessment & Plan:   1. Atherosclerosis of native arteries of the extremities with ulceration [facility])  Today Mr. Lipinski had a lower left extremity arterial duplex.  It revealed generally triphasic flow down to the level of the distal popliteal which revealed hyperemic flows.  The posterior tibial proximal showed a 75 to 99% stenosis.  Monophasic flows in the posterior tibial mid and distal.  Hyperemic monophasic flows of the  peroneal.  The anterior tibial artery distal appears occluded.  ABIs done on 04/28/2018 reveal an ABI of 1.15 on the left lower extremity with an ABI of 1.01 on the right  Recommend:  The patient has evidence of atherosclerosis of the lower extremities with ulceration.  The patient does not voice lifestyle limiting changes at this point in time.  Noninvasive studies do not suggest clinically significant change.  No invasive studies, angiography or surgery at this time The patient should continue walking and begin a more formal exercise program.  The patient should continue antiplatelet therapy and aggressive treatment of the lipid abnormalities  No changes in the patient's medications at this time  The patient is continuing to follow with Dr. Alberteen Spindle in podiatry for wound care.  We will schedule follow-up duplex in 3 months.  Currently the patient's wound is healing, so we will maintain close follow-up.  The patient's wound status begins to change or deteriorate we will plan for a follow-up angiogram.  Patient and wife are in agreement with this plan.  - VAS [place] LOWER EXTREMITY ARTERIAL DUPLEX; Future  2. Essential hypertension Continue antihypertensive medications as already ordered, these medications have been reviewed and there are no changes at this  time.   3. Type 2 diabetes mellitus with diabetic peripheral angiopathy and gangrene, without long-term current use of insulin (HCC) Continue hypoglycemic medications as already ordered, these medications have been reviewed and there are no changes at this time.  Hgb A1C to be monitored as already arranged by primary service    Current Outpatient Medications on File Prior to Visit  Medication Sig Dispense Refill  . amLODipine (NORVASC) 2.5 MG tablet Take 2.5 mg by mouth daily.  2  . clopidogrel (PLAVIX) 75 MG tablet Take 1 tablet (75 mg total) by mouth daily. 30 tablet 4  . lisinopril (PRINIVIL,ZESTRIL) 5 MG tablet Take 5 mg by mouth daily.  5  . metFORMIN (GLUCOPHAGE) 500 MG tablet Take 1,000 mg by mouth 2 (two) times daily.  5  . silver sulfADIAZINE (SILVADENE) 1 % cream APPLY TOPICALLY ONCE DAILY APPLY 3 TIMES A WEEK  1  . sulfamethoxazole-trimethoprim (BACTRIM DS,SEPTRA DS) 800-160 MG tablet Take by mouth.    . oxyCODONE (OXY IR/ROXICODONE) 5 MG immediate release tablet Take 1 tablet (5 mg total) by mouth every 6 (six) hours as needed for moderate pain or severe pain. (Patient not taking: Reported on 04/28/2018) 20 tablet 0   No current facility-administered medications on file prior to visit.     There are no Patient Instructions on file for this visit. No follow-ups on file.   Georgiana Spinner, NP

## 2018-06-13 DIAGNOSIS — Z7984 Long term (current) use of oral hypoglycemic drugs: Secondary | ICD-10-CM | POA: Diagnosis not present

## 2018-06-13 DIAGNOSIS — Z8631 Personal history of diabetic foot ulcer: Secondary | ICD-10-CM | POA: Diagnosis not present

## 2018-06-13 DIAGNOSIS — T8744 Infection of amputation stump, left lower extremity: Secondary | ICD-10-CM | POA: Diagnosis not present

## 2018-06-13 DIAGNOSIS — Z7902 Long term (current) use of antithrombotics/antiplatelets: Secondary | ICD-10-CM | POA: Diagnosis not present

## 2018-06-13 DIAGNOSIS — Z87891 Personal history of nicotine dependence: Secondary | ICD-10-CM | POA: Diagnosis not present

## 2018-06-13 DIAGNOSIS — E1151 Type 2 diabetes mellitus with diabetic peripheral angiopathy without gangrene: Secondary | ICD-10-CM | POA: Diagnosis not present

## 2018-06-13 DIAGNOSIS — T8781 Dehiscence of amputation stump: Secondary | ICD-10-CM | POA: Diagnosis not present

## 2018-06-16 DIAGNOSIS — Z7984 Long term (current) use of oral hypoglycemic drugs: Secondary | ICD-10-CM | POA: Diagnosis not present

## 2018-06-16 DIAGNOSIS — T8781 Dehiscence of amputation stump: Secondary | ICD-10-CM | POA: Diagnosis not present

## 2018-06-16 DIAGNOSIS — Z87891 Personal history of nicotine dependence: Secondary | ICD-10-CM | POA: Diagnosis not present

## 2018-06-16 DIAGNOSIS — Z7902 Long term (current) use of antithrombotics/antiplatelets: Secondary | ICD-10-CM | POA: Diagnosis not present

## 2018-06-16 DIAGNOSIS — E1151 Type 2 diabetes mellitus with diabetic peripheral angiopathy without gangrene: Secondary | ICD-10-CM | POA: Diagnosis not present

## 2018-06-16 DIAGNOSIS — T8744 Infection of amputation stump, left lower extremity: Secondary | ICD-10-CM | POA: Diagnosis not present

## 2018-06-16 DIAGNOSIS — Z8631 Personal history of diabetic foot ulcer: Secondary | ICD-10-CM | POA: Diagnosis not present

## 2018-06-18 DIAGNOSIS — T8744 Infection of amputation stump, left lower extremity: Secondary | ICD-10-CM | POA: Diagnosis not present

## 2018-06-18 DIAGNOSIS — Z8631 Personal history of diabetic foot ulcer: Secondary | ICD-10-CM | POA: Diagnosis not present

## 2018-06-18 DIAGNOSIS — T8781 Dehiscence of amputation stump: Secondary | ICD-10-CM | POA: Diagnosis not present

## 2018-06-18 DIAGNOSIS — Z87891 Personal history of nicotine dependence: Secondary | ICD-10-CM | POA: Diagnosis not present

## 2018-06-18 DIAGNOSIS — Z7902 Long term (current) use of antithrombotics/antiplatelets: Secondary | ICD-10-CM | POA: Diagnosis not present

## 2018-06-18 DIAGNOSIS — Z7984 Long term (current) use of oral hypoglycemic drugs: Secondary | ICD-10-CM | POA: Diagnosis not present

## 2018-06-18 DIAGNOSIS — E1151 Type 2 diabetes mellitus with diabetic peripheral angiopathy without gangrene: Secondary | ICD-10-CM | POA: Diagnosis not present

## 2018-06-19 DIAGNOSIS — L97522 Non-pressure chronic ulcer of other part of left foot with fat layer exposed: Secondary | ICD-10-CM | POA: Diagnosis not present

## 2018-06-19 DIAGNOSIS — I739 Peripheral vascular disease, unspecified: Secondary | ICD-10-CM | POA: Diagnosis not present

## 2018-06-19 DIAGNOSIS — L603 Nail dystrophy: Secondary | ICD-10-CM | POA: Diagnosis not present

## 2018-06-20 DIAGNOSIS — Z8631 Personal history of diabetic foot ulcer: Secondary | ICD-10-CM | POA: Diagnosis not present

## 2018-06-20 DIAGNOSIS — Z87891 Personal history of nicotine dependence: Secondary | ICD-10-CM | POA: Diagnosis not present

## 2018-06-20 DIAGNOSIS — T8781 Dehiscence of amputation stump: Secondary | ICD-10-CM | POA: Diagnosis not present

## 2018-06-20 DIAGNOSIS — Z7902 Long term (current) use of antithrombotics/antiplatelets: Secondary | ICD-10-CM | POA: Diagnosis not present

## 2018-06-20 DIAGNOSIS — Z7984 Long term (current) use of oral hypoglycemic drugs: Secondary | ICD-10-CM | POA: Diagnosis not present

## 2018-06-20 DIAGNOSIS — T8744 Infection of amputation stump, left lower extremity: Secondary | ICD-10-CM | POA: Diagnosis not present

## 2018-06-20 DIAGNOSIS — E1151 Type 2 diabetes mellitus with diabetic peripheral angiopathy without gangrene: Secondary | ICD-10-CM | POA: Diagnosis not present

## 2018-06-23 DIAGNOSIS — T8781 Dehiscence of amputation stump: Secondary | ICD-10-CM | POA: Diagnosis not present

## 2018-06-23 DIAGNOSIS — Z87891 Personal history of nicotine dependence: Secondary | ICD-10-CM | POA: Diagnosis not present

## 2018-06-23 DIAGNOSIS — T8744 Infection of amputation stump, left lower extremity: Secondary | ICD-10-CM | POA: Diagnosis not present

## 2018-06-23 DIAGNOSIS — Z7984 Long term (current) use of oral hypoglycemic drugs: Secondary | ICD-10-CM | POA: Diagnosis not present

## 2018-06-23 DIAGNOSIS — E1151 Type 2 diabetes mellitus with diabetic peripheral angiopathy without gangrene: Secondary | ICD-10-CM | POA: Diagnosis not present

## 2018-06-23 DIAGNOSIS — Z7902 Long term (current) use of antithrombotics/antiplatelets: Secondary | ICD-10-CM | POA: Diagnosis not present

## 2018-06-23 DIAGNOSIS — Z8631 Personal history of diabetic foot ulcer: Secondary | ICD-10-CM | POA: Diagnosis not present

## 2018-06-25 DIAGNOSIS — E1151 Type 2 diabetes mellitus with diabetic peripheral angiopathy without gangrene: Secondary | ICD-10-CM | POA: Diagnosis not present

## 2018-06-25 DIAGNOSIS — Z87891 Personal history of nicotine dependence: Secondary | ICD-10-CM | POA: Diagnosis not present

## 2018-06-25 DIAGNOSIS — T8781 Dehiscence of amputation stump: Secondary | ICD-10-CM | POA: Diagnosis not present

## 2018-06-25 DIAGNOSIS — T8744 Infection of amputation stump, left lower extremity: Secondary | ICD-10-CM | POA: Diagnosis not present

## 2018-06-25 DIAGNOSIS — Z7984 Long term (current) use of oral hypoglycemic drugs: Secondary | ICD-10-CM | POA: Diagnosis not present

## 2018-06-25 DIAGNOSIS — Z7902 Long term (current) use of antithrombotics/antiplatelets: Secondary | ICD-10-CM | POA: Diagnosis not present

## 2018-06-25 DIAGNOSIS — Z8631 Personal history of diabetic foot ulcer: Secondary | ICD-10-CM | POA: Diagnosis not present

## 2018-06-27 DIAGNOSIS — T8781 Dehiscence of amputation stump: Secondary | ICD-10-CM | POA: Diagnosis not present

## 2018-06-27 DIAGNOSIS — T8744 Infection of amputation stump, left lower extremity: Secondary | ICD-10-CM | POA: Diagnosis not present

## 2018-06-27 DIAGNOSIS — E1151 Type 2 diabetes mellitus with diabetic peripheral angiopathy without gangrene: Secondary | ICD-10-CM | POA: Diagnosis not present

## 2018-06-27 DIAGNOSIS — Z87891 Personal history of nicotine dependence: Secondary | ICD-10-CM | POA: Diagnosis not present

## 2018-06-27 DIAGNOSIS — Z7902 Long term (current) use of antithrombotics/antiplatelets: Secondary | ICD-10-CM | POA: Diagnosis not present

## 2018-06-27 DIAGNOSIS — Z7984 Long term (current) use of oral hypoglycemic drugs: Secondary | ICD-10-CM | POA: Diagnosis not present

## 2018-06-27 DIAGNOSIS — Z8631 Personal history of diabetic foot ulcer: Secondary | ICD-10-CM | POA: Diagnosis not present

## 2018-06-30 DIAGNOSIS — Z8631 Personal history of diabetic foot ulcer: Secondary | ICD-10-CM | POA: Diagnosis not present

## 2018-06-30 DIAGNOSIS — E1151 Type 2 diabetes mellitus with diabetic peripheral angiopathy without gangrene: Secondary | ICD-10-CM | POA: Diagnosis not present

## 2018-06-30 DIAGNOSIS — Z7984 Long term (current) use of oral hypoglycemic drugs: Secondary | ICD-10-CM | POA: Diagnosis not present

## 2018-06-30 DIAGNOSIS — Z87891 Personal history of nicotine dependence: Secondary | ICD-10-CM | POA: Diagnosis not present

## 2018-06-30 DIAGNOSIS — T8781 Dehiscence of amputation stump: Secondary | ICD-10-CM | POA: Diagnosis not present

## 2018-06-30 DIAGNOSIS — T8744 Infection of amputation stump, left lower extremity: Secondary | ICD-10-CM | POA: Diagnosis not present

## 2018-06-30 DIAGNOSIS — Z7902 Long term (current) use of antithrombotics/antiplatelets: Secondary | ICD-10-CM | POA: Diagnosis not present

## 2018-07-02 DIAGNOSIS — Z8631 Personal history of diabetic foot ulcer: Secondary | ICD-10-CM | POA: Diagnosis not present

## 2018-07-02 DIAGNOSIS — T8744 Infection of amputation stump, left lower extremity: Secondary | ICD-10-CM | POA: Diagnosis not present

## 2018-07-02 DIAGNOSIS — T8781 Dehiscence of amputation stump: Secondary | ICD-10-CM | POA: Diagnosis not present

## 2018-07-02 DIAGNOSIS — E1151 Type 2 diabetes mellitus with diabetic peripheral angiopathy without gangrene: Secondary | ICD-10-CM | POA: Diagnosis not present

## 2018-07-02 DIAGNOSIS — Z7902 Long term (current) use of antithrombotics/antiplatelets: Secondary | ICD-10-CM | POA: Diagnosis not present

## 2018-07-02 DIAGNOSIS — Z7984 Long term (current) use of oral hypoglycemic drugs: Secondary | ICD-10-CM | POA: Diagnosis not present

## 2018-07-02 DIAGNOSIS — Z87891 Personal history of nicotine dependence: Secondary | ICD-10-CM | POA: Diagnosis not present

## 2018-07-04 DIAGNOSIS — T8781 Dehiscence of amputation stump: Secondary | ICD-10-CM | POA: Diagnosis not present

## 2018-07-04 DIAGNOSIS — E1151 Type 2 diabetes mellitus with diabetic peripheral angiopathy without gangrene: Secondary | ICD-10-CM | POA: Diagnosis not present

## 2018-07-04 DIAGNOSIS — Z8631 Personal history of diabetic foot ulcer: Secondary | ICD-10-CM | POA: Diagnosis not present

## 2018-07-04 DIAGNOSIS — Z7902 Long term (current) use of antithrombotics/antiplatelets: Secondary | ICD-10-CM | POA: Diagnosis not present

## 2018-07-04 DIAGNOSIS — T8744 Infection of amputation stump, left lower extremity: Secondary | ICD-10-CM | POA: Diagnosis not present

## 2018-07-04 DIAGNOSIS — Z87891 Personal history of nicotine dependence: Secondary | ICD-10-CM | POA: Diagnosis not present

## 2018-07-04 DIAGNOSIS — Z7984 Long term (current) use of oral hypoglycemic drugs: Secondary | ICD-10-CM | POA: Diagnosis not present

## 2018-07-07 DIAGNOSIS — T8781 Dehiscence of amputation stump: Secondary | ICD-10-CM | POA: Diagnosis not present

## 2018-07-07 DIAGNOSIS — Z7902 Long term (current) use of antithrombotics/antiplatelets: Secondary | ICD-10-CM | POA: Diagnosis not present

## 2018-07-07 DIAGNOSIS — Z87891 Personal history of nicotine dependence: Secondary | ICD-10-CM | POA: Diagnosis not present

## 2018-07-07 DIAGNOSIS — E1151 Type 2 diabetes mellitus with diabetic peripheral angiopathy without gangrene: Secondary | ICD-10-CM | POA: Diagnosis not present

## 2018-07-07 DIAGNOSIS — T8744 Infection of amputation stump, left lower extremity: Secondary | ICD-10-CM | POA: Diagnosis not present

## 2018-07-07 DIAGNOSIS — Z8631 Personal history of diabetic foot ulcer: Secondary | ICD-10-CM | POA: Diagnosis not present

## 2018-07-07 DIAGNOSIS — Z7984 Long term (current) use of oral hypoglycemic drugs: Secondary | ICD-10-CM | POA: Diagnosis not present

## 2018-07-09 DIAGNOSIS — Z8631 Personal history of diabetic foot ulcer: Secondary | ICD-10-CM | POA: Diagnosis not present

## 2018-07-09 DIAGNOSIS — Z7902 Long term (current) use of antithrombotics/antiplatelets: Secondary | ICD-10-CM | POA: Diagnosis not present

## 2018-07-09 DIAGNOSIS — E1151 Type 2 diabetes mellitus with diabetic peripheral angiopathy without gangrene: Secondary | ICD-10-CM | POA: Diagnosis not present

## 2018-07-09 DIAGNOSIS — Z7984 Long term (current) use of oral hypoglycemic drugs: Secondary | ICD-10-CM | POA: Diagnosis not present

## 2018-07-09 DIAGNOSIS — T8744 Infection of amputation stump, left lower extremity: Secondary | ICD-10-CM | POA: Diagnosis not present

## 2018-07-09 DIAGNOSIS — Z87891 Personal history of nicotine dependence: Secondary | ICD-10-CM | POA: Diagnosis not present

## 2018-07-09 DIAGNOSIS — T8781 Dehiscence of amputation stump: Secondary | ICD-10-CM | POA: Diagnosis not present

## 2018-07-11 DIAGNOSIS — Z7984 Long term (current) use of oral hypoglycemic drugs: Secondary | ICD-10-CM | POA: Diagnosis not present

## 2018-07-11 DIAGNOSIS — Z8631 Personal history of diabetic foot ulcer: Secondary | ICD-10-CM | POA: Diagnosis not present

## 2018-07-11 DIAGNOSIS — T8781 Dehiscence of amputation stump: Secondary | ICD-10-CM | POA: Diagnosis not present

## 2018-07-11 DIAGNOSIS — Z87891 Personal history of nicotine dependence: Secondary | ICD-10-CM | POA: Diagnosis not present

## 2018-07-11 DIAGNOSIS — Z7902 Long term (current) use of antithrombotics/antiplatelets: Secondary | ICD-10-CM | POA: Diagnosis not present

## 2018-07-11 DIAGNOSIS — T8744 Infection of amputation stump, left lower extremity: Secondary | ICD-10-CM | POA: Diagnosis not present

## 2018-07-11 DIAGNOSIS — E1151 Type 2 diabetes mellitus with diabetic peripheral angiopathy without gangrene: Secondary | ICD-10-CM | POA: Diagnosis not present

## 2018-07-14 DIAGNOSIS — E1151 Type 2 diabetes mellitus with diabetic peripheral angiopathy without gangrene: Secondary | ICD-10-CM | POA: Diagnosis not present

## 2018-07-14 DIAGNOSIS — T8744 Infection of amputation stump, left lower extremity: Secondary | ICD-10-CM | POA: Diagnosis not present

## 2018-07-14 DIAGNOSIS — Z7902 Long term (current) use of antithrombotics/antiplatelets: Secondary | ICD-10-CM | POA: Diagnosis not present

## 2018-07-14 DIAGNOSIS — Z87891 Personal history of nicotine dependence: Secondary | ICD-10-CM | POA: Diagnosis not present

## 2018-07-14 DIAGNOSIS — Z7984 Long term (current) use of oral hypoglycemic drugs: Secondary | ICD-10-CM | POA: Diagnosis not present

## 2018-07-14 DIAGNOSIS — Z8631 Personal history of diabetic foot ulcer: Secondary | ICD-10-CM | POA: Diagnosis not present

## 2018-07-14 DIAGNOSIS — T8781 Dehiscence of amputation stump: Secondary | ICD-10-CM | POA: Diagnosis not present

## 2018-07-15 DIAGNOSIS — I10 Essential (primary) hypertension: Secondary | ICD-10-CM | POA: Diagnosis not present

## 2018-07-15 DIAGNOSIS — E782 Mixed hyperlipidemia: Secondary | ICD-10-CM | POA: Diagnosis not present

## 2018-07-15 DIAGNOSIS — E1165 Type 2 diabetes mellitus with hyperglycemia: Secondary | ICD-10-CM | POA: Diagnosis not present

## 2018-07-16 DIAGNOSIS — Z7984 Long term (current) use of oral hypoglycemic drugs: Secondary | ICD-10-CM | POA: Diagnosis not present

## 2018-07-16 DIAGNOSIS — T8744 Infection of amputation stump, left lower extremity: Secondary | ICD-10-CM | POA: Diagnosis not present

## 2018-07-16 DIAGNOSIS — Z7902 Long term (current) use of antithrombotics/antiplatelets: Secondary | ICD-10-CM | POA: Diagnosis not present

## 2018-07-16 DIAGNOSIS — E1151 Type 2 diabetes mellitus with diabetic peripheral angiopathy without gangrene: Secondary | ICD-10-CM | POA: Diagnosis not present

## 2018-07-16 DIAGNOSIS — Z8631 Personal history of diabetic foot ulcer: Secondary | ICD-10-CM | POA: Diagnosis not present

## 2018-07-16 DIAGNOSIS — Z87891 Personal history of nicotine dependence: Secondary | ICD-10-CM | POA: Diagnosis not present

## 2018-07-16 DIAGNOSIS — T8781 Dehiscence of amputation stump: Secondary | ICD-10-CM | POA: Diagnosis not present

## 2018-07-17 ENCOUNTER — Inpatient Hospital Stay
Admission: AD | Admit: 2018-07-17 | Discharge: 2018-07-23 | DRG: 617 | Disposition: A | Discharge status: Skilled Nursing Facility | Payer: Medicare Other | Source: Ambulatory Visit | Attending: Internal Medicine | Admitting: Internal Medicine

## 2018-07-17 ENCOUNTER — Other Ambulatory Visit
Admission: RE | Admit: 2018-07-17 | Discharge: 2018-07-17 | Disposition: A | Discharge status: Home / Self Care | Payer: Medicare Other | Source: Ambulatory Visit | Attending: Podiatry | Admitting: Podiatry

## 2018-07-17 ENCOUNTER — Other Ambulatory Visit: Payer: Self-pay

## 2018-07-17 DIAGNOSIS — N189 Chronic kidney disease, unspecified: Secondary | ICD-10-CM | POA: Diagnosis present

## 2018-07-17 DIAGNOSIS — I10 Essential (primary) hypertension: Secondary | ICD-10-CM | POA: Diagnosis not present

## 2018-07-17 DIAGNOSIS — M86672 Other chronic osteomyelitis, left ankle and foot: Secondary | ICD-10-CM | POA: Diagnosis not present

## 2018-07-17 DIAGNOSIS — E1142 Type 2 diabetes mellitus with diabetic polyneuropathy: Secondary | ICD-10-CM | POA: Diagnosis present

## 2018-07-17 DIAGNOSIS — Z833 Family history of diabetes mellitus: Secondary | ICD-10-CM

## 2018-07-17 DIAGNOSIS — Z89412 Acquired absence of left great toe: Secondary | ICD-10-CM | POA: Diagnosis not present

## 2018-07-17 DIAGNOSIS — Z7401 Bed confinement status: Secondary | ICD-10-CM | POA: Diagnosis not present

## 2018-07-17 DIAGNOSIS — N179 Acute kidney failure, unspecified: Secondary | ICD-10-CM | POA: Diagnosis not present

## 2018-07-17 DIAGNOSIS — E1122 Type 2 diabetes mellitus with diabetic chronic kidney disease: Secondary | ICD-10-CM | POA: Diagnosis not present

## 2018-07-17 DIAGNOSIS — R600 Localized edema: Secondary | ICD-10-CM | POA: Diagnosis not present

## 2018-07-17 DIAGNOSIS — I129 Hypertensive chronic kidney disease with stage 1 through stage 4 chronic kidney disease, or unspecified chronic kidney disease: Secondary | ICD-10-CM | POA: Diagnosis present

## 2018-07-17 DIAGNOSIS — L97929 Non-pressure chronic ulcer of unspecified part of left lower leg with unspecified severity: Secondary | ICD-10-CM | POA: Diagnosis present

## 2018-07-17 DIAGNOSIS — Z7902 Long term (current) use of antithrombotics/antiplatelets: Secondary | ICD-10-CM | POA: Diagnosis not present

## 2018-07-17 DIAGNOSIS — M86172 Other acute osteomyelitis, left ankle and foot: Secondary | ICD-10-CM | POA: Diagnosis not present

## 2018-07-17 DIAGNOSIS — Z89422 Acquired absence of other left toe(s): Secondary | ICD-10-CM | POA: Diagnosis not present

## 2018-07-17 DIAGNOSIS — Z7984 Long term (current) use of oral hypoglycemic drugs: Secondary | ICD-10-CM

## 2018-07-17 DIAGNOSIS — M869 Osteomyelitis, unspecified: Secondary | ICD-10-CM | POA: Diagnosis present

## 2018-07-17 DIAGNOSIS — E1151 Type 2 diabetes mellitus with diabetic peripheral angiopathy without gangrene: Secondary | ICD-10-CM | POA: Diagnosis not present

## 2018-07-17 DIAGNOSIS — K5909 Other constipation: Secondary | ICD-10-CM | POA: Diagnosis not present

## 2018-07-17 DIAGNOSIS — E785 Hyperlipidemia, unspecified: Secondary | ICD-10-CM | POA: Diagnosis not present

## 2018-07-17 DIAGNOSIS — I96 Gangrene, not elsewhere classified: Secondary | ICD-10-CM | POA: Diagnosis not present

## 2018-07-17 DIAGNOSIS — Z87891 Personal history of nicotine dependence: Secondary | ICD-10-CM

## 2018-07-17 DIAGNOSIS — M6281 Muscle weakness (generalized): Secondary | ICD-10-CM | POA: Diagnosis not present

## 2018-07-17 DIAGNOSIS — E11622 Type 2 diabetes mellitus with other skin ulcer: Secondary | ICD-10-CM | POA: Diagnosis present

## 2018-07-17 DIAGNOSIS — Z887 Allergy status to serum and vaccine status: Secondary | ICD-10-CM | POA: Diagnosis not present

## 2018-07-17 DIAGNOSIS — Z79899 Other long term (current) drug therapy: Secondary | ICD-10-CM | POA: Diagnosis not present

## 2018-07-17 DIAGNOSIS — M79672 Pain in left foot: Secondary | ICD-10-CM | POA: Diagnosis not present

## 2018-07-17 DIAGNOSIS — E119 Type 2 diabetes mellitus without complications: Secondary | ICD-10-CM | POA: Diagnosis not present

## 2018-07-17 DIAGNOSIS — I739 Peripheral vascular disease, unspecified: Secondary | ICD-10-CM | POA: Diagnosis not present

## 2018-07-17 DIAGNOSIS — S98912A Complete traumatic amputation of left foot, level unspecified, initial encounter: Secondary | ICD-10-CM | POA: Diagnosis not present

## 2018-07-17 DIAGNOSIS — E1169 Type 2 diabetes mellitus with other specified complication: Principal | ICD-10-CM | POA: Diagnosis present

## 2018-07-17 LAB — CBC
HCT: 29.7 % — ABNORMAL LOW (ref 39.0–52.0)
Hemoglobin: 9.5 g/dL — ABNORMAL LOW (ref 13.0–17.0)
MCH: 30.1 pg (ref 26.0–34.0)
MCHC: 32 g/dL (ref 30.0–36.0)
MCV: 94 fL (ref 80.0–100.0)
NRBC: 0 % (ref 0.0–0.2)
Platelets: 248 10*3/uL (ref 150–400)
RBC: 3.16 MIL/uL — ABNORMAL LOW (ref 4.22–5.81)
RDW: 13.4 % (ref 11.5–15.5)
WBC: 8.2 10*3/uL (ref 4.0–10.5)

## 2018-07-17 LAB — COMPREHENSIVE METABOLIC PANEL
ALT: 72 U/L — AB (ref 0–44)
AST: 27 U/L (ref 15–41)
Albumin: 3.8 g/dL (ref 3.5–5.0)
Alkaline Phosphatase: 83 U/L (ref 38–126)
Anion gap: 7 (ref 5–15)
BUN: 24 mg/dL — AB (ref 8–23)
CO2: 25 mmol/L (ref 22–32)
CREATININE: 1.41 mg/dL — AB (ref 0.61–1.24)
Calcium: 9.5 mg/dL (ref 8.9–10.3)
Chloride: 105 mmol/L (ref 98–111)
GFR calc Af Amer: 54 mL/min — ABNORMAL LOW (ref >60)
GFR calc non Af Amer: 47 mL/min — ABNORMAL LOW (ref >60)
Glucose, Bld: 112 mg/dL — ABNORMAL HIGH (ref 70–99)
Potassium: 5.4 mmol/L — ABNORMAL HIGH (ref 3.5–5.1)
SODIUM: 137 mmol/L (ref 135–145)
Total Bilirubin: 0.4 mg/dL (ref 0.3–1.2)
Total Protein: 7.6 g/dL (ref 6.5–8.1)

## 2018-07-17 LAB — SURGICAL PCR SCREEN
MRSA, PCR: POSITIVE — AB
Staphylococcus aureus: POSITIVE — AB

## 2018-07-17 LAB — GLUCOSE, CAPILLARY: Glucose-Capillary: 89 mg/dL (ref 70–99)

## 2018-07-17 MED ORDER — VANCOMYCIN HCL 10 G IV SOLR
1500.0000 mg | INTRAVENOUS | Status: DC
  Administered 2018-07-18 – 2018-07-23 (×6): 1500 mg via INTRAVENOUS
  Filled 2018-07-17 (×7): qty 1500

## 2018-07-17 MED ORDER — AMLODIPINE BESYLATE 5 MG PO TABS
2.5000 mg | ORAL_TABLET | Freq: Every day | ORAL | Status: DC
  Administered 2018-07-18 – 2018-07-23 (×5): 2.5 mg via ORAL
  Filled 2018-07-17 (×5): qty 1

## 2018-07-17 MED ORDER — MUPIROCIN 2 % EX OINT
1.0000 "application " | TOPICAL_OINTMENT | Freq: Two times a day (BID) | CUTANEOUS | Status: AC
  Administered 2018-07-17 – 2018-07-19 (×4): 1 via NASAL
  Filled 2018-07-17: qty 22

## 2018-07-17 MED ORDER — SODIUM CHLORIDE 0.9 % IV SOLN
2.0000 g | Freq: Once | INTRAVENOUS | Status: AC
  Administered 2018-07-17: 2 g via INTRAVENOUS
  Filled 2018-07-17: qty 2

## 2018-07-17 MED ORDER — LISINOPRIL 5 MG PO TABS
5.0000 mg | ORAL_TABLET | Freq: Every day | ORAL | Status: DC
  Administered 2018-07-20 – 2018-07-23 (×4): 5 mg via ORAL
  Filled 2018-07-17 (×4): qty 1

## 2018-07-17 MED ORDER — METRONIDAZOLE IN NACL 5-0.79 MG/ML-% IV SOLN
500.0000 mg | Freq: Three times a day (TID) | INTRAVENOUS | Status: DC
  Administered 2018-07-17 – 2018-07-22 (×12): 500 mg via INTRAVENOUS
  Filled 2018-07-17 (×18): qty 100

## 2018-07-17 MED ORDER — INSULIN ASPART 100 UNIT/ML ~~LOC~~ SOLN
0.0000 [IU] | Freq: Three times a day (TID) | SUBCUTANEOUS | Status: DC
  Administered 2018-07-18 – 2018-07-19 (×2): 1 [IU] via SUBCUTANEOUS
  Administered 2018-07-20: 2 [IU] via SUBCUTANEOUS
  Administered 2018-07-21 – 2018-07-22 (×3): 1 [IU] via SUBCUTANEOUS
  Filled 2018-07-17 (×6): qty 1

## 2018-07-17 MED ORDER — HEPARIN SODIUM (PORCINE) 5000 UNIT/ML IJ SOLN
5000.0000 [IU] | Freq: Three times a day (TID) | INTRAMUSCULAR | Status: DC
  Administered 2018-07-17 – 2018-07-23 (×15): 5000 [IU] via SUBCUTANEOUS
  Filled 2018-07-17 (×15): qty 1

## 2018-07-17 MED ORDER — DOCUSATE SODIUM 100 MG PO CAPS: 100.0000 mg | ORAL_CAPSULE | Freq: Two times a day (BID) | ORAL | Status: DC | PRN

## 2018-07-17 MED ORDER — OXYCODONE HCL 5 MG PO TABS
5.0000 mg | ORAL_TABLET | Freq: Four times a day (QID) | ORAL | Status: DC | PRN
  Filled 2018-07-17: qty 1

## 2018-07-17 MED ORDER — SODIUM CHLORIDE 0.9 % IV SOLN
2.0000 g | Freq: Two times a day (BID) | INTRAVENOUS | Status: DC
  Administered 2018-07-18 – 2018-07-22 (×8): 2 g via INTRAVENOUS
  Filled 2018-07-17 (×11): qty 2

## 2018-07-17 MED ORDER — VANCOMYCIN HCL IN DEXTROSE 1-5 GM/200ML-% IV SOLN
1000.0000 mg | Freq: Once | INTRAVENOUS | Status: AC
  Administered 2018-07-17: 1000 mg via INTRAVENOUS
  Filled 2018-07-17: qty 200

## 2018-07-17 NOTE — Consult Note (Signed)
Pharmacy Antibiotic Note  [name] is a 77 y.o. male admitted on 07/17/2018 with Osteomyelitis.  Pharmacy has been consulted for Cefepime and Vancomycin dosing. Patient received vancomycin 1g IV x 1 dose and Cefepime 2mg  IV x 1 dose.   Plan: Ke: 0.043   T1/2: 16.12   VD: 60.2  Start Vancomycin 1500 IV every 24 hours with 6 hours stack dosing.  Goal trough 15-20 mcg/mL. Calculated trough at Css is 17. Trough level prior to 4th dose.   Start Cefepime 2g IV every 12 hours   Height: 5\' 8"  (172.7 cm) Weight: 190 lb (86.2 kg) IBW/kg (Calculated) : 68.4  Temp (24hrs), Avg:98.3 F (36.8 C), Min:98.3 F (36.8 C), Max:98.3 F (36.8 C)  Recent Labs  Lab 07/17/18 1816  WBC 8.2  CREATININE 1.41*    Estimated Creatinine Clearance: 46.9 mL/min (A) (by C-G formula based on SCr of 1.41 mg/dL (H)).    Allergies  Allergen Reactions  . Flu Virus Vaccine Other (See Comments) and Nausea And Vomiting    Fever, chills, vomiting.     Antimicrobials this admission: 10/31 vancomycin  >>  10/31 Cefepime >>   Dose adjustments this admission:  Microbiology results: 10/31 MRSA PCR: Positive   Thank you for allowing pharmacy to be a part of this patient's care.  Gardner Candle, PharmD, BCPS Clinical Pharmacist 07/17/2018 9:27 PM

## 2018-07-17 NOTE — Progress Notes (Signed)
Family Meeting Note  Advance Directive:no  Today a meeting took place with the Patient and spouse.  The following clinical team members were present during this meeting:MD  The following were discussed:Patient's diagnosis: osteomyelitis, DM, Patient's progosis: Unable to determine and Goals for treatment: full code.  Additional follow-up to be provided: Podiatry  Time spent during discussion:20 minutes  Altamese Dilling, MD

## 2018-07-17 NOTE — H&P (Signed)
[facility] [place] at [facility]   PATIENT NAME: [name]    MR#:  [id number]  DATE OF BIRTH:  Jan 16, 1941  DATE OF ADMISSION:  07/17/2018  PRIMARY CARE PHYSICIAN: Wilford Corner, PA-C   REQUESTING/REFERRING PHYSICIAN: Clein  CHIEF COMPLAINT:  No chief complaint on file.   HISTORY OF PRESENT ILLNESS: [name]  is a 77 y.o. male with a known history of diabetes mellitus and osteomyelitis of left foot-had ray resection in July 2019 and was on oral Bactrim since then.  Has also home visiting nurse for wound care 2 times a week. The wound care nurse noted some new possible collections on his feet but patient did not had any fever or chills.  He had a regular visit to Dr. Graciela Husbands today. Dr. Graciela Husbands had ordered x-ray on his feet to check for osteomyelitis and which reported to be positive, so he called in for direct technician for plan of transmetatarsal amputation likely tomorrow.  PAST MEDICAL HISTORY:   Past Medical History:  Diagnosis Date  . Diabetes mellitus without complication (HCC)     PAST SURGICAL HISTORY:  Past Surgical History:  Procedure Laterality Date  . AMPUTATION TOE Left 03/28/2018   Procedure: AMPUTATION TOE/ PARTIAL RAY RESECTION-LEFT 2ND AND 3RD;  Surgeon: Linus Galas, DPM;  Location: [facility] ORS;  Service: Podiatry;  Laterality: Left;  . HERNIA REPAIR    . LOWER EXTREMITY ANGIOGRAPHY Left 03/26/2018   Procedure: Lower Extremity Angiography;  Surgeon: Renford Dills, MD;  Location: [facility] INVASIVE CV LAB;  Service: Cardiovascular;  Laterality: Left;    SOCIAL HISTORY:  Social History   Tobacco Use  . Smoking status: Former Smoker    Types: Cigarettes  . Smokeless tobacco: Never Used  Substance Use Topics  . Alcohol use: Never    Frequency: Never    FAMILY HISTORY: No family history on file.  DRUG ALLERGIES:  Allergies  Allergen Reactions  . Flu Virus Vaccine Other (See Comments) and Nausea And Vomiting    Fever,  chills, vomiting.     REVIEW OF SYSTEMS:   CONSTITUTIONAL: No fever, fatigue or weakness.  EYES: No blurred or double vision.  EARS, NOSE, AND THROAT: No tinnitus or ear pain.  RESPIRATORY: No cough, shortness of breath, wheezing or hemoptysis.  CARDIOVASCULAR: No chest pain, orthopnea, edema.  GASTROINTESTINAL: No nausea, vomiting, diarrhea or abdominal pain.  GENITOURINARY: No dysuria, hematuria.  ENDOCRINE: No polyuria, nocturia,  HEMATOLOGY: No anemia, easy bruising or bleeding SKIN: No rash or lesion. MUSCULOSKELETAL: No joint pain or arthritis.  Left foot pain NEUROLOGIC: No tingling, numbness, weakness.  PSYCHIATRY: No anxiety or depression.   MEDICATIONS AT HOME:  Prior to Admission medications   Medication Sig Start Date End Date Taking? Authorizing Provider  amLODipine (NORVASC) 2.5 MG tablet Take 2.5 mg by mouth daily. 03/14/18   [provider]  clopidogrel (PLAVIX) 75 MG tablet Take 1 tablet (75 mg total) by mouth daily. 03/30/18 03/30/19  Enid Baas, MD  lisinopril (PRINIVIL,ZESTRIL) 5 MG tablet Take 5 mg by mouth daily. 02/25/18   [provider]  metFORMIN (GLUCOPHAGE) 500 MG tablet Take 1,000 mg by mouth 2 (two) times daily. 02/25/18   [provider]  oxyCODONE (OXY IR/ROXICODONE) 5 MG immediate release tablet Take 1 tablet (5 mg total) by mouth every 6 (six) hours as needed for moderate pain or severe pain. Patient not taking: Reported on 04/28/2018 03/30/18   Enid Baas, MD  silver sulfADIAZINE (SILVADENE) 1 % cream  APPLY TOPICALLY ONCE DAILY APPLY 3 TIMES A WEEK 04/28/18   [provider]  sulfamethoxazole-trimethoprim (BACTRIM DS,SEPTRA DS) 800-160 MG tablet Take by mouth. 04/17/18   [provider]      PHYSICAL EXAMINATION:   VITAL SIGNS: Blood pressure (!) 154/77, pulse 85, temperature 98.3 F (36.8 C), temperature source Oral, SpO2 100 %.  GENERAL:  77 y.o.-year-old patient lying in the bed with no  acute distress.  EYES: Pupils equal, round, reactive to light and accommodation. No scleral icterus. Extraocular muscles intact.  HEENT: Head atraumatic, normocephalic. Oropharynx and nasopharynx clear.  NECK:  Supple, no jugular venous distention. No thyroid enlargement, no tenderness.  LUNGS: Normal breath sounds bilaterally, no wheezing, rales,rhonchi or crepitation. No use of accessory muscles of respiration.  CARDIOVASCULAR: S1, S2 normal. No murmurs, rubs, or gallops.  ABDOMEN: Soft, nontender, nondistended. Bowel sounds present. No organomegaly or mass.  EXTREMITIES: No pedal edema, cyanosis, or clubbing.  Dressing on left foot. NEUROLOGIC: Cranial nerves II through XII are intact. Muscle strength 5/5 in all extremities. Sensation intact. Gait not checked.  PSYCHIATRIC: The patient is alert and oriented x 3.  SKIN: No obvious rash, lesion, or ulcer.   LABORATORY PANEL:   CBC No results for input(s): WBC, HGB, HCT, PLT, MCV, MCH, MCHC, RDW, LYMPHSABS, MONOABS, EOSABS, BASOSABS, BANDABS in the last 168 hours.  Invalid input(s): NEUTRABS, BANDSABD ------------------------------------------------------------------------------------------------------------------  Chemistries  No results for input(s): NA, K, CL, CO2, GLUCOSE, BUN, CREATININE, CALCIUM, MG, AST, ALT, ALKPHOS, BILITOT in the last 168 hours.  Invalid input(s): GFRCGP ------------------------------------------------------------------------------------------------------------------ CrCl cannot be calculated (Patient's most recent lab result is older than the maximum 21 days allowed.). ------------------------------------------------------------------------------------------------------------------ No results for input(s): TSH, T4TOTAL, T3FREE, THYROIDAB in the last 72 hours.  Invalid input(s): FREET3   Coagulation profile No results for input(s): INR, PROTIME in the last 168  hours. ------------------------------------------------------------------------------------------------------------------- No results for input(s): DDIMER in the last 72 hours. -------------------------------------------------------------------------------------------------------------------  Cardiac Enzymes No results for input(s): CKMB, TROPONINI, MYOGLOBIN in the last 168 hours.  Invalid input(s): CK ------------------------------------------------------------------------------------------------------------------ Invalid input(s): POCBNP  ---------------------------------------------------------------------------------------------------------------  Urinalysis No results found for: COLORURINE, APPEARANCEUR, LABSPEC, PHURINE, GLUCOSEU, HGBUR, BILIRUBINUR, KETONESUR, PROTEINUR, UROBILINOGEN, NITRITE, LEUKOCYTESUR   RADIOLOGY: No results found.  EKG: No orders found for this or any previous visit.  IMPRESSION AND PLAN:  *Osteomyelitis on left foot Status post ray resection in July 2019 Plan is for transmetatarsal amputation Currently IV Vanco and cefepime and Flagyl Keep n.p.o. postmidnight  *Diabetes We will hold oral medication and keep on sliding scale coverage  * Htn   Cont home meds  * Peripheral vascular disease  Cont plavix.  All the records are reviewed and case discussed with ED provider. Management plans discussed with the patient, family and they are in agreement.  CODE STATUS: Full code.    Code Status Orders  (From admission, onward)         Start     Ordered   07/17/18 1808  Full code  Continuous     07/17/18 1808        Code Status History    Date Active Date Inactive Code Status Order ID Comments User Context   03/26/2018 1835 03/30/2018 2009 Full Code [id number]  Schnier, Latina Craver, MD Inpatient       TOTAL TIME TAKING CARE OF THIS PATIENT: 34 minutes.    Altamese Dilling M.D on 07/17/2018   Between 7am to 6pm - Pager -  [phone]  After 6pm go to [email or web address] - password  [email or web address]  [facility]  Office  [phone]  CC: Primary care physician; Whitaker, Jonnie Finner, PA-C   Note: This dictation was prepared with Dragon dictation along with smaller phrase technology. Any transcriptional errors that result from this process are unintentional.

## 2018-07-17 NOTE — Progress Notes (Signed)
Direct admit from Dr. Wilburn Mylar office. Pt alert and oriented. In no acute distress at this time. VSS. Gauze to LLE intact. Pt oriented to unit and safety measures.

## 2018-07-18 ENCOUNTER — Inpatient Hospital Stay: Payer: Medicare Other

## 2018-07-18 ENCOUNTER — Encounter: Payer: Self-pay | Admitting: Podiatry

## 2018-07-18 LAB — BASIC METABOLIC PANEL
Anion gap: 7 (ref 5–15)
BUN: 25 mg/dL — ABNORMAL HIGH (ref 8–23)
CO2: 26 mmol/L (ref 22–32)
Calcium: 8.7 mg/dL — ABNORMAL LOW (ref 8.9–10.3)
Chloride: 105 mmol/L (ref 98–111)
Creatinine, Ser: 1.45 mg/dL — ABNORMAL HIGH (ref 0.61–1.24)
GFR calc non Af Amer: 45 mL/min — ABNORMAL LOW (ref >60)
GFR, EST AFRICAN AMERICAN: 52 mL/min — AB (ref >60)
Glucose, Bld: 89 mg/dL (ref 70–99)
POTASSIUM: 4.5 mmol/L (ref 3.5–5.1)
SODIUM: 138 mmol/L (ref 135–145)

## 2018-07-18 LAB — CBC
HEMATOCRIT: 27.5 % — AB (ref 39.0–52.0)
HEMOGLOBIN: 8.7 g/dL — AB (ref 13.0–17.0)
MCH: 30 pg (ref 26.0–34.0)
MCHC: 31.6 g/dL (ref 30.0–36.0)
MCV: 94.8 fL (ref 80.0–100.0)
NRBC: 0 % (ref 0.0–0.2)
Platelets: 237 10*3/uL (ref 150–400)
RBC: 2.9 MIL/uL — ABNORMAL LOW (ref 4.22–5.81)
RDW: 13.6 % (ref 11.5–15.5)
WBC: 7.5 10*3/uL (ref 4.0–10.5)

## 2018-07-18 LAB — GLUCOSE, CAPILLARY
GLUCOSE-CAPILLARY: 84 mg/dL (ref 70–99)
GLUCOSE-CAPILLARY: 93 mg/dL (ref 70–99)
GLUCOSE-CAPILLARY: 121 mg/dL — AB (ref 70–99)
Glucose-Capillary: 120 mg/dL — ABNORMAL HIGH (ref 70–99)

## 2018-07-18 LAB — PROTIME-INR
INR: 1.25
Prothrombin Time: 15.6 seconds — ABNORMAL HIGH (ref 11.4–15.2)

## 2018-07-18 MED ORDER — MUPIROCIN 2 % EX OINT
1.0000 "application " | TOPICAL_OINTMENT | Freq: Two times a day (BID) | CUTANEOUS | Status: DC
  Filled 2018-07-18: qty 22

## 2018-07-18 MED ORDER — CHLORHEXIDINE GLUCONATE CLOTH 2 % EX PADS
6.0000 | MEDICATED_PAD | Freq: Every day | CUTANEOUS | Status: AC
  Administered 2018-07-18 – 2018-07-21 (×4): 6 via TOPICAL

## 2018-07-18 NOTE — Consult Note (Signed)
Pharmacy Antibiotic Note  [name] is a 77 y.o. male admitted on 07/17/2018 with Osteomyelitis.  Pharmacy has been consulted for Cefepime and Vancomycin dosing. Patient received vancomycin 1g IV x 1 dose and Cefepime 2mg  IV x 1 dose.   Plan: Ke: 0.043   T1/2: 16.12   VD: 60.2  Continue Vancomycin 1500 IV every 24 hours with 6 hours stack dosing.  Goal trough 15-20 mcg/mL. Calculated trough at Css is 17. Trough level prior to 4th dose.   Continue Cefepime 2g IV every 12 hours   Height: 5\' 8"  (172.7 cm) Weight: 190 lb (86.2 kg) IBW/kg (Calculated) : 68.4  Temp (24hrs), Avg:98.5 F (36.9 C), Min:98.3 F (36.8 C), Max:98.6 F (37 C)  Recent Labs  Lab 07/17/18 1816 07/18/18 0502  WBC 8.2 7.5  CREATININE 1.41* 1.45*    Estimated Creatinine Clearance: 45.6 mL/min (A) (by C-G formula based on SCr of 1.45 mg/dL (H)).    Allergies  Allergen Reactions  . Flu Virus Vaccine Other (See Comments) and Nausea And Vomiting    Fever, chills, vomiting.     Antimicrobials this admission: 10/31 vancomycin  >>  10/31 Cefepime >>  10/31 Metronidazole>>  Dose adjustments this admission:  Microbiology results: 10/31 MRSA PCR: Positive  10/31 Wound cx: pending  Thank you for allowing pharmacy to be a part of this patient's care.  Orinda Kenner, PharmD Clinical Pharmacist 07/18/2018 9:45 AM

## 2018-07-18 NOTE — Consult Note (Signed)
[facility] Podiatry                                                      Patient Demographics  [name], is a 77 y.o. male   MRN: [id number]   DOB - [birth year]  Admit Date - 07/17/2018    Outpatient Primary MD for the patient is Harlon Flor, Jonnie Finner, PA-C  Consult requested in the Hospital by Altamese Dilling, *, On 07/18/2018    Reason for consult likely osteomyelitis left foot   With History of -  Past Medical History:  Diagnosis Date  . Diabetes mellitus without complication [facility]       Past Surgical History:  Procedure Laterality Date  . AMPUTATION TOE Left 03/28/2018   Procedure: AMPUTATION TOE/ PARTIAL RAY RESECTION-LEFT 2ND AND 3RD;  Surgeon: Linus Galas, DPM;  Location: [facility] ORS;  Service: Podiatry;  Laterality: Left;  . HERNIA REPAIR    . LOWER EXTREMITY ANGIOGRAPHY Left 03/26/2018   Procedure: Lower Extremity Angiography;  Surgeon: Renford Dills, MD;  Location: [facility] INVASIVE CV LAB;  Service: Cardiovascular;  Laterality: Left;    in for   No chief complaint on file.    HPI  [name]  is a 77 y.o. male, patient has been followed by Dr. Linus Galas my partner.  He had amputation of the second third toes 3 months ago he seemed to progress from there but is recently started to develop drainage and inflammation from the base of the great toe area.  Is been getting daily wound care and has been off on on oral antibiotics but he still has developed some abnormal drainage and deformity.  X-ray showed some bone changes at the base of the great toe and the metatarsal head region.    Review of Systems    In addition to the HPI above,  No Fever-chills, No Headache, No changes with Vision or hearing, No problems swallowing food or Liquids, No Chest pain, Cough or Shortness of Breath, No Abdominal pain, No Nausea or  Vommitting, Bowel movements are regular, No Blood in stool or Urine, No dysuria, No new skin rashes or bruises, No new joints pains-aches,  No new weakness, tingling, numbness in any extremity, No recent weight gain or loss, No polyuria, polydypsia or polyphagia, No significant Mental Stressors.  A full 10 point Review of Systems was done, except as stated above, all other Review of Systems were negative.   Social History Social History   Tobacco Use  . Smoking status: Former Smoker    Types: Cigarettes  . Smokeless tobacco: Never Used  Substance Use Topics  . Alcohol use: Never    Frequency: Never    Family History No family history on file.  Prior to Admission medications   Medication Sig Start Date End Date Taking? Authorizing Provider  amLODipine (NORVASC) 2.5 MG tablet Take 2.5 mg by mouth daily. 03/14/18   [provider]  clopidogrel (PLAVIX) 75 MG tablet Take 1 tablet (75 mg total) by mouth daily. 03/30/18 03/30/19  Enid Baas, MD  lisinopril (PRINIVIL,ZESTRIL) 5 MG tablet Take 5 mg by mouth daily. 02/25/18   [provider]  metFORMIN (GLUCOPHAGE) 500 MG tablet Take 1,000 mg by mouth 2 (two) times daily. 02/25/18   [provider]  oxyCODONE (OXY IR/ROXICODONE) 5 MG immediate release tablet  Take 1 tablet (5 mg total) by mouth every 6 (six) hours as needed for moderate pain or severe pain. Patient not taking: Reported on 04/28/2018 03/30/18   Enid Baas, MD  silver sulfADIAZINE (SILVADENE) 1 % cream APPLY TOPICALLY ONCE DAILY APPLY 3 TIMES A WEEK 04/28/18   [provider]  sulfamethoxazole-trimethoprim (BACTRIM DS,SEPTRA DS) 800-160 MG tablet Take by mouth. 04/17/18   [provider]    Anti-infectives (From admission, onward)   Start     Dose/Rate Route Frequency Ordered Stop   07/18/18 0800  ceFEPIme (MAXIPIME) 2 g in sodium chloride 0.9 % 100 mL IVPB     2 g 200 mL/hr over 30 Minutes Intravenous Every 12 hours  07/17/18 2136     07/18/18 0400  vancomycin (VANCOCIN) 1,500 mg in sodium chloride 0.9 % 500 mL IVPB     1,500 mg 250 mL/hr over 120 Minutes Intravenous Every 24 hours 07/17/18 2135     07/17/18 2000  metroNIDAZOLE (FLAGYL) IVPB 500 mg     500 mg 100 mL/hr over 60 Minutes Intravenous Every 8 hours 07/17/18 1819     07/17/18 1900  ceFEPIme (MAXIPIME) 2 g in sodium chloride 0.9 % 100 mL IVPB     2 g 200 mL/hr over 30 Minutes Intravenous  Once 07/17/18 1825 07/17/18 2057   07/17/18 1900  vancomycin (VANCOCIN) IVPB 1000 mg/200 mL premix     1,000 mg 200 mL/hr over 60 Minutes Intravenous  Once 07/17/18 1825 07/17/18 2232      Scheduled Meds: . amLODipine  2.5 mg Oral Daily  . Chlorhexidine Gluconate Cloth  6 each Topical Q0600  . heparin  5,000 Units Subcutaneous Q8H  . insulin aspart  0-9 Units Subcutaneous TID WC  . lisinopril  5 mg Oral Daily  . mupirocin ointment  1 application Nasal BID   Continuous Infusions: . ceFEPime (MAXIPIME) IV 2 g (07/18/18 0852)  . metronidazole 500 mg (07/18/18 0702)  . vancomycin 250 mL/hr at 07/18/18 0534   PRN Meds:.docusate sodium, oxyCODONE  Allergies  Allergen Reactions  . Flu Virus Vaccine Other (See Comments) and Nausea And Vomiting    Fever, chills, vomiting.     Physical Exam  Vitals  Blood pressure (!) 144/67, pulse 74, temperature 98.6 F (37 C), temperature source Oral, resp. rate 18, height 5\' 8"  (1.727 m), weight 86.2 kg, SpO2 97 %.  Lower Extremity exam:  Vascular.  DP and PT pulses are difficult to palpate but he states that he has had vascular reconstruction in the past.  Dermatological: Patient has a wound at the medial aspect of the great toe at the base on the left side.  About 3 cm in length with about 1.8 cm in width.  Compression around that area shows heavy purulent drainage emitting from the region.  Is not a lot of [employer] as far as cellulitis or infection in the skin it looks like but would likely coming from  the bone and joint level.  Neurological: Diabetic peripheral neuropathy  Ortho: Previous amputation of left second third toes due to osteomyelitis this includes the metatarsal heads.  Also there is degenerative change possible fracture to the base of the proximal phalanx based on regular x-ray.  There is significant crepitus range of motion does not hurt him due to his neuropathy.  Some cystic changes starting to occur in the base proximal phalanx as well as in the head of the metatarsal.  Data Review  CBC Recent Labs  Lab  07/17/18 1816 07/18/18 0502  WBC 8.2 7.5  HGB 9.5* 8.7*  HCT 29.7* 27.5*  PLT 248 237  MCV 94.0 94.8  MCH 30.1 30.0  MCHC 32.0 31.6  RDW 13.4 13.6   ------------------------------------------------------------------------------------------------------------------  Chemistries  Recent Labs  Lab 07/17/18 1816 07/18/18 0502  NA 137 138  K 5.4* 4.5  CL 105 105  CO2 25 26  GLUCOSE 112* 89  BUN 24* 25*  CREATININE 1.41* 1.45*  CALCIUM 9.5 8.7*  AST 27  --   ALT 72*  --   ALKPHOS 83  --   BILITOT 0.4  --    ------------------------------------------------------------------------------ Imaging results:   Dg Foot 2 Views Left  Result Date: 07/18/2018 CLINICAL DATA:  Osteomyelitis. EXAM: LEFT FOOT - 2 VIEW COMPARISON:  03/24/2018 FINDINGS: Prior amputation at the distal metatarsal level of the 2nd and 3rd toes. Lucency and cortical disruption noted at the base of the left great toe proximal phalanx which is new since prior study. Cannot exclude fracture or osteomyelitis. No soft tissue gas or visible soft tissue abnormality. 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 Lucency and cortical disruption at the base of the left great toe proximal phalanx could reflect posttraumatic fractures or osteomyelitis. Recommend clinical correlation. Electronically Signed   By: Charlett Nose M.D.   On: 07/18/2018 09:16    Assessment & Plan: We will get  an MRI also have a better idea as to what level he take these metatarsals off.  I think he is going be much better off with a transmetatarsal amputation on this because I think the great toe in the first metatarsal need to be removed with the first ray resection in order to resolve the infection but I would leave him with 2 toes and to metatarsal heads and I think he will function better with transmetatarsal amputation.  We will also give him better chance of reduced problems in the future.  Explained all this to him and his wife today they seem to understand it.  I explained the consent form to him and explained that they will have him sign a consent form for transmetatarsal amputation.  Also going get an MRI on the foot to see what bones potentially involved and what level.  Principal Problem:   Osteomyelitis [facility])   Family Communication: Plan discussed with patient and family  Recardo Evangelist M.D on 07/18/2018 at 1:01 PM  Thank you for the consult, we will follow the patient with you in the Hospital.

## 2018-07-18 NOTE — Progress Notes (Signed)
[employer] [facility] [facility] [place] at [facility]   PATIENT NAME: [name]    MR#:  [id number]  DATE OF BIRTH:  22-Aug-1941  SUBJECTIVE:  CHIEF COMPLAINT:  No chief complaint on file.  History of amputation on toes in the past, sent by podiatry clinic because of new findings of osteomyelitis and other toes. No complaints today.  REVIEW OF SYSTEMS:  CONSTITUTIONAL: No fever, fatigue or weakness.  EYES: No blurred or double vision.  EARS, NOSE, AND THROAT: No tinnitus or ear pain.  RESPIRATORY: No cough, shortness of breath, wheezing or hemoptysis.  CARDIOVASCULAR: No chest pain, orthopnea, edema.  GASTROINTESTINAL: No nausea, vomiting, diarrhea or abdominal pain.  GENITOURINARY: No dysuria, hematuria.  ENDOCRINE: No polyuria, nocturia,  HEMATOLOGY: No anemia, easy bruising or bleeding SKIN: No rash or lesion. MUSCULOSKELETAL: No joint pain or arthritis.   NEUROLOGIC: No tingling, numbness, weakness.  PSYCHIATRY: No anxiety or depression.   ROS  DRUG ALLERGIES:   Allergies  Allergen Reactions  . Flu Virus Vaccine Other (See Comments) and Nausea And Vomiting    Fever, chills, vomiting.     VITALS:  Blood pressure 136/72, pulse 70, temperature 98.4 F (36.9 C), temperature source Oral, resp. rate 18, height 5\' 8"  (1.727 m), weight 86.2 kg, SpO2 96 %.  PHYSICAL EXAMINATION:  GENERAL:  77 y.o.-year-old patient lying in the bed with no acute distress.  EYES: Pupils equal, round, reactive to light and accommodation. No scleral icterus. Extraocular muscles intact.  HEENT: Head atraumatic, normocephalic. Oropharynx and nasopharynx clear.  NECK:  Supple, no jugular venous distention. No thyroid enlargement, no tenderness.  LUNGS: Normal breath sounds bilaterally, no wheezing, rales,rhonchi or crepitation. No use of accessory muscles of respiration.  CARDIOVASCULAR: S1, S2 normal. No murmurs, rubs, or gallops.  ABDOMEN: Soft, nontender, nondistended. Bowel sounds present.  No organomegaly or mass.  EXTREMITIES: No pedal edema, cyanosis, or clubbing.  Dressing on left foot. NEUROLOGIC: Cranial nerves II through XII are intact. Muscle strength 5/5 in all extremities. Sensation intact. Gait not checked.  PSYCHIATRIC: The patient is alert and oriented x 3.  SKIN: No obvious rash, lesion, or ulcer.   Physical Exam LABORATORY PANEL:   CBC Recent Labs  Lab 07/18/18 0502  WBC 7.5  HGB 8.7*  HCT 27.5*  PLT 237   ------------------------------------------------------------------------------------------------------------------  Chemistries  Recent Labs  Lab 07/17/18 1816 07/18/18 0502  NA 137 138  K 5.4* 4.5  CL 105 105  CO2 25 26  GLUCOSE 112* 89  BUN 24* 25*  CREATININE 1.41* 1.45*  CALCIUM 9.5 8.7*  AST 27  --   ALT 72*  --   ALKPHOS 83  --   BILITOT 0.4  --    ------------------------------------------------------------------------------------------------------------------  Cardiac Enzymes No results for input(s): TROPONINI in the last 168 hours. ------------------------------------------------------------------------------------------------------------------  RADIOLOGY:  Mr Foot Left Wo Contrast  Result Date: 07/18/2018 CLINICAL DATA:  Drainage and inflammation at the base of the great toe. Status post amputation of the second and third toes 3 months ago. EXAM: MRI OF THE LEFT FOOT WITHOUT CONTRAST TECHNIQUE: Multiplanar, multisequence MR imaging of the left forefoot was performed. No intravenous contrast was administered. COMPARISON:  Same day radiographs of the foot. FINDINGS: Bones/Joint/Cartilage Abnormal marrow signal and edema involving the distal 4/5th of the first metatarsal with fracture at the base of the first proximal phalanx with marrow edema noted of the proximal and distal phalanges. Given soft tissue inflammatory change and fluid adjacent to the first MTP joint medially, findings  are concerning for osteomyelitis. Minimal  reactive edema versus osteomyelitis involving the surgical margins of the second and third metatarsals. The fourth and fifth rays as well as included midfoot are unremarkable. Ligaments Noncontributory Muscles and Tendons Forefoot myositis without pyomyositis. The flexor and extensor tendons demonstrate no tenosynovitis or unexpected disruption. Soft tissues Diffuse soft tissue edema of the forefoot consistent with cellulitis. More focal curvilinear fluid collection as described along the medial and dorsal aspect of the great toe. Suggestion of small cutaneous ulceration along the plantar lateral base of the great toe. IMPRESSION: 1. Findings concerning for osteomyelitis of the first ray with associated forefoot cellulitis, myositis and plantar cutaneous ulcer at the base of the great toe. Small curvilinear fluid collection as described above at the level of the first MTP joint medially. Marrow edema involves the distal 4/5th of the first metatarsal with fracture at the base of the first proximal phalanx with marrow signal abnormality along its entirety. Minimal marrow edema at the base of the distal phalanx. 2. 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 Electronically Signed   By: Tollie Eth M.D.   On: 07/18/2018 14:49   Dg Foot 2 Views Left  Result Date: 07/18/2018 CLINICAL DATA:  Osteomyelitis. EXAM: LEFT FOOT - 2 VIEW COMPARISON:  03/24/2018 FINDINGS: Prior amputation at the distal metatarsal level of the 2nd and 3rd toes. Lucency and cortical disruption noted at the base of the left great toe proximal phalanx which is new since prior study. Cannot exclude fracture or osteomyelitis. No soft tissue gas or visible soft tissue abnormality. 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 Lucency and cortical disruption at  the base of the left great toe proximal phalanx could reflect posttraumatic fractures or osteomyelitis. Recommend clinical correlation. Electronically Signed   By: Charlett Nose M.D.   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 Requested MRI to get better idea about extent of infection.  *Diabetes We will hold oral medication and keep on sliding scale coverage Blood sugar is under control.  * Htn   Cont home meds-fairly controlled.  * Peripheral vascular disease  Cont plavix.    All the records are reviewed and case discussed with Care Management/Social Workerr. Management plans discussed with the patient, family and they are in agreement.  CODE STATUS: Full code.  TOTAL TIME TAKING CARE OF THIS PATIENT: 35 minutes.   Patient's wife present in the room.  POSSIBLE D/C IN 2-3 DAYS, DEPENDING ON CLINICAL CONDITION.   Altamese Dilling M.D on 07/18/2018   Between 7am to 6pm - Pager - [phone]  After 6pm go to [email or web address] - Social research officer, government  [facility]  Office  [phone]  CC: Primary care physician; Whitaker, Jonnie Finner, PA-C  Note: This dictation was prepared with Dragon dictation along with smaller phrase technology. Any transcriptional errors that result from this process are unintentional.

## 2018-07-19 ENCOUNTER — Inpatient Hospital Stay: Payer: Medicare Other | Admitting: Anesthesiology

## 2018-07-19 ENCOUNTER — Encounter
Admission: AD | Disposition: A | Discharge status: Skilled Nursing Facility | Payer: Self-pay | Source: Ambulatory Visit | Attending: Internal Medicine

## 2018-07-19 ENCOUNTER — Encounter: Payer: Self-pay | Admitting: Anesthesiology

## 2018-07-19 HISTORY — PX: TRANSMETATARSAL AMPUTATION: SHX6197

## 2018-07-19 LAB — GLUCOSE, CAPILLARY
GLUCOSE-CAPILLARY: 98 mg/dL (ref 70–99)
GLUCOSE-CAPILLARY: 118 mg/dL — AB (ref 70–99)
Glucose-Capillary: 102 mg/dL — ABNORMAL HIGH (ref 70–99)
Glucose-Capillary: 97 mg/dL (ref 70–99)
Glucose-Capillary: 127 mg/dL — ABNORMAL HIGH (ref 70–99)

## 2018-07-19 SURGERY — AMPUTATION, FOOT, TRANSMETATARSAL
Anesthesia: General | Laterality: Left

## 2018-07-19 MED ORDER — ONDANSETRON HCL 4 MG/2ML IJ SOLN
INTRAMUSCULAR | Status: AC
  Filled 2018-07-19: qty 2

## 2018-07-19 MED ORDER — FENTANYL CITRATE (PF) 100 MCG/2ML IJ SOLN: 25.0000 ug | INTRAMUSCULAR | Status: DC | PRN

## 2018-07-19 MED ORDER — DIPHENHYDRAMINE HCL 50 MG/ML IJ SOLN
INTRAMUSCULAR | Status: DC | PRN
  Administered 2018-07-19: 12.5 mg via INTRAVENOUS

## 2018-07-19 MED ORDER — PROPOFOL 10 MG/ML IV BOLUS
INTRAVENOUS | Status: DC | PRN
  Administered 2018-07-19: 50 mg via INTRAVENOUS
  Administered 2018-07-19: 100 mg via INTRAVENOUS

## 2018-07-19 MED ORDER — DIPHENHYDRAMINE HCL 50 MG/ML IJ SOLN
INTRAMUSCULAR | Status: AC
  Filled 2018-07-19: qty 1

## 2018-07-19 MED ORDER — ACETAMINOPHEN 325 MG PO TABS
650.0000 mg | ORAL_TABLET | Freq: Four times a day (QID) | ORAL | Status: DC | PRN
  Administered 2018-07-19 – 2018-07-21 (×2): 650 mg via ORAL
  Filled 2018-07-19 (×2): qty 2

## 2018-07-19 MED ORDER — BUPIVACAINE LIPOSOME 1.3 % IJ SUSP
INTRAMUSCULAR | Status: AC
  Filled 2018-07-19: qty 20

## 2018-07-19 MED ORDER — PHENYLEPHRINE HCL 10 MG/ML IJ SOLN
INTRAMUSCULAR | Status: DC | PRN
  Administered 2018-07-19 (×2): 200 ug via INTRAVENOUS

## 2018-07-19 MED ORDER — BUPIVACAINE LIPOSOME 1.3 % IJ SUSP
INTRAMUSCULAR | Status: DC | PRN
  Administered 2018-07-19: 5 mL

## 2018-07-19 MED ORDER — ONDANSETRON HCL 4 MG/2ML IJ SOLN: 4.0000 mg | Freq: Once | INTRAMUSCULAR | Status: DC | PRN

## 2018-07-19 MED ORDER — PROPOFOL 10 MG/ML IV BOLUS
INTRAVENOUS | Status: AC
  Filled 2018-07-19: qty 20

## 2018-07-19 MED ORDER — FENTANYL CITRATE (PF) 100 MCG/2ML IJ SOLN
INTRAMUSCULAR | Status: AC
  Filled 2018-07-19: qty 2

## 2018-07-19 MED ORDER — SUCCINYLCHOLINE CHLORIDE 20 MG/ML IJ SOLN
INTRAMUSCULAR | Status: DC | PRN
  Administered 2018-07-19: 60 mg via INTRAVENOUS

## 2018-07-19 MED ORDER — PHENYLEPHRINE HCL 10 MG/ML IJ SOLN
INTRAMUSCULAR | Status: AC
  Filled 2018-07-19: qty 1

## 2018-07-19 MED ORDER — LIDOCAINE HCL (CARDIAC) PF 100 MG/5ML IV SOSY
PREFILLED_SYRINGE | INTRAVENOUS | Status: DC | PRN
  Administered 2018-07-19: 100 mg via INTRAVENOUS

## 2018-07-19 MED ORDER — SODIUM CHLORIDE 0.9 % IV SOLN
INTRAVENOUS | Status: DC | PRN
  Administered 2018-07-19: 08:00:00 via INTRAVENOUS

## 2018-07-19 MED ORDER — LIDOCAINE HCL (PF) 1 % IJ SOLN
INTRAMUSCULAR | Status: AC
  Filled 2018-07-19: qty 30

## 2018-07-19 MED ORDER — FENTANYL CITRATE (PF) 100 MCG/2ML IJ SOLN
INTRAMUSCULAR | Status: DC | PRN
  Administered 2018-07-19: 25 ug via INTRAVENOUS
  Administered 2018-07-19: 50 ug via INTRAVENOUS
  Administered 2018-07-19: 25 ug via INTRAVENOUS

## 2018-07-19 MED ORDER — SUCCINYLCHOLINE CHLORIDE 20 MG/ML IJ SOLN
INTRAMUSCULAR | Status: AC
  Filled 2018-07-19: qty 1

## 2018-07-19 MED ORDER — DEXTROSE 50 % IV SOLN
INTRAVENOUS | Status: AC
  Administered 2018-07-19: 08:00:00
  Filled 2018-07-19: qty 50

## 2018-07-19 MED ORDER — MIDAZOLAM HCL 2 MG/2ML IJ SOLN
INTRAMUSCULAR | Status: DC | PRN
  Administered 2018-07-19: 2 mg via INTRAVENOUS

## 2018-07-19 MED ORDER — ONDANSETRON HCL 4 MG/2ML IJ SOLN
INTRAMUSCULAR | Status: DC | PRN
  Administered 2018-07-19: 4 mg via INTRAVENOUS

## 2018-07-19 MED ORDER — BUPIVACAINE HCL (PF) 0.5 % IJ SOLN
INTRAMUSCULAR | Status: DC | PRN
  Administered 2018-07-19: 15 mL

## 2018-07-19 MED ORDER — BUPIVACAINE HCL (PF) 0.5 % IJ SOLN
INTRAMUSCULAR | Status: AC
  Filled 2018-07-19: qty 30

## 2018-07-19 MED ORDER — SODIUM CHLORIDE FLUSH 0.9 % IV SOLN
INTRAVENOUS | Status: AC
  Administered 2018-07-19: 12:00:00
  Filled 2018-07-19: qty 10

## 2018-07-19 MED ORDER — SODIUM CHLORIDE FLUSH 0.9 % IV SOLN
INTRAVENOUS | Status: AC
  Filled 2018-07-19: qty 50

## 2018-07-19 MED ORDER — DEXTROSE 50 % IV SOLN
8.0000 g | Freq: Once | INTRAVENOUS | Status: AC
  Administered 2018-07-19: 8 g via INTRAVENOUS

## 2018-07-19 SURGICAL SUPPLY — 41 items
BAG COUNTER SPONGE EZ (MISCELLANEOUS) ×2 IMPLANT
BANDAGE ELASTIC 4 LF NS (GAUZE/BANDAGES/DRESSINGS) ×3 IMPLANT
BLADE SAW GIGLI 510 (BLADE) ×2 IMPLANT
BLADE SAW GIGLI 510MM (BLADE) ×1
BNDG COHESIVE 4X5 TAN STRL (GAUZE/BANDAGES/DRESSINGS) ×3 IMPLANT
BNDG ESMARK 4X12 TAN STRL LF (GAUZE/BANDAGES/DRESSINGS) ×3 IMPLANT
BNDG GAUZE 4.5X4.1 6PLY STRL (MISCELLANEOUS) ×3 IMPLANT
BNDG STRETCH 4X75 STRL LF (GAUZE/BANDAGES/DRESSINGS) ×3 IMPLANT
CANISTER SUCT 1200ML W/VALVE (MISCELLANEOUS) ×3 IMPLANT
COUNTER SPONGE BAG EZ (MISCELLANEOUS) ×1
COVER WAND RF STERILE (DRAPES) IMPLANT
CUFF TOURN 18 STER (MISCELLANEOUS) ×3 IMPLANT
CUFF TOURN DUAL PL 12 NO SLV (MISCELLANEOUS) ×3 IMPLANT
DRAIN PENROSE 1/4X12 LTX (DRAIN) ×3 IMPLANT
DRAPE FLUOR MINI C-ARM 54X84 (DRAPES) ×3 IMPLANT
DURAPREP 26ML APPLICATOR (WOUND CARE) ×3 IMPLANT
ELECT REM PT RETURN 9FT ADLT (ELECTROSURGICAL) ×3
ELECTRODE REM PT RTRN 9FT ADLT (ELECTROSURGICAL) ×1 IMPLANT
GAUZE PETRO XEROFOAM 1X8 (MISCELLANEOUS) ×3 IMPLANT
GAUZE SPONGE 4X4 12PLY STRL (GAUZE/BANDAGES/DRESSINGS) ×3 IMPLANT
GLOVE BIO SURGEON STRL SZ8 (GLOVE) ×12 IMPLANT
GLOVE INDICATOR 8.0 STRL GRN (GLOVE) ×3 IMPLANT
GOWN STRL REUS W/ TWL LRG LVL3 (GOWN DISPOSABLE) ×2 IMPLANT
GOWN STRL REUS W/TWL LRG LVL3 (GOWN DISPOSABLE) ×4
HANDLE YANKAUER SUCT BULB TIP (MISCELLANEOUS) ×3 IMPLANT
HANDPIECE VERSAJET DEBRIDEMENT (MISCELLANEOUS) ×3 IMPLANT
IV NS 1000ML (IV SOLUTION) ×2
IV NS 1000ML BAXH (IV SOLUTION) ×1 IMPLANT
KIT TURNOVER KIT A (KITS) ×3 IMPLANT
LABEL OR SOLS (LABEL) ×3 IMPLANT
NDL SAFETY ECLIPSE 18X1.5 (NEEDLE) ×1 IMPLANT
NEEDLE HYPO 18GX1.5 SHARP (NEEDLE) ×2
NS IRRIG 1000ML POUR BTL (IV SOLUTION) ×3 IMPLANT
NS IRRIG 500ML POUR BTL (IV SOLUTION) ×3 IMPLANT
PACK EXTREMITY ARMC (MISCELLANEOUS) ×3 IMPLANT
PAD ABD DERMACEA PRESS 5X9 (GAUZE/BANDAGES/DRESSINGS) ×6 IMPLANT
PENCIL ELECTRO HAND CTR (MISCELLANEOUS) ×3 IMPLANT
SPONGE LAP 18X18 RF (DISPOSABLE) ×3 IMPLANT
STOCKINETTE M/LG 89821 (MISCELLANEOUS) ×3 IMPLANT
SYR 10ML LL (SYRINGE) ×3 IMPLANT
SYSTEM CHEST DRAIN TLS 7FR (DRAIN) ×3 IMPLANT

## 2018-07-19 NOTE — Op Note (Signed)
Operative note   Surgeon: Dr. Recardo Evangelist, DPM.    Assistant: None    Preop diagnosis: Osteomyelitis first metatarsal and great toe left foot.    Postop diagnosis: Same    Procedure:   1.  Transmetatarsal amputation left foot         EBL: 30 cc    Anesthesia:general delivered by the anesthesia team I injected a combination of 10 cc of 0.5% Marcaine plain and 15 cc of Exparel the base of the operative sites across the base of the metatarsal areas.  Some subjective preop some postop    Hemostasis: Ankle tourniquet at 2 and 30 mils mercury pressure for 30 minutes    Specimen: Toes and the metatarsals from left foot    Complications: None    Operative indications: Patient had second and third ray amputation several months ago due to osteomyelitis and infection in those regions due to diabetic ulcerations and wounds.  He did okay for a while but eventually he continued to have an open draining wound and MRI done yesterday showed osteomyelitis throughout most of the first metatarsal distal two thirds along with probable osteomyelitis to the great toe.  Felt he would function much better with a transmetatarsal amputation following this first ray resection since it already had the second third toes removed.  Explained that that he should get along better with that and have a better chance of healing with lesser risk of further ulceration.  He understood that.    Procedure:  Patient was brought into the OR and placed on the operating table in thesupine position. After anesthesia was obtained theleft lower extremity was prepped and draped in usual sterile fashion.  Operative Report: This time to direct to the distal left foot 2 similar incisions were made from medial dorsal lateral to medial plantar lateral carried down to bone.  This point soft tissue was dissected away from the metatarsal heads and metatarsophalangeal joint soft tissue released was accomplished and the digits were removed  this included the first third and fourth digits.  Second third digits have been removed at a previous date.  At this point the first metatarsal was identified and evaluated.  Hard bone was encountered at approximately the proximal fifth of the metatarsal which is where this area was resected.  The bone appeared to be clean in this region.  At this point modification of the second third metatarsal previous removals were accomplished with a sagittal saw and removal of the distal half of the fourth fifth metatarsals were accomplished.  This point an x-ray was taken the sesamoids were removed also.  Good metatarsal parabola especially across the second through fifth metatarsals was achieved.  The areas were then copiously irrigated and the tourniquet released.  All bleeders were clamped and bovied as required.  No arterial bleeding was encountered at least from a pulsatile standpoint.  Good capillary bleeding was encountered.  After copious irrigation a 2-0 Vicryl was used to close deep fascial tissue with simple interrupted sutures.  Skin was then closed with 3-0 nylon simple interrupted sutures and the margins came together nicely.  A #7 TLS drain was placed into the wound at this timeframe and activated.  Next a sterile compressive dressing was placed across the wound.  This was after the Exparel injection.  Dressing consisted of Xeroform gauze 4 x 4's conformer ABD pads and Kerlix and Ace wrap.  The TLS drain tube was anchored with 3 half-inch Steri-Strips.    Patient tolerated the  procedure and anesthesia well.  Was transported from the OR to the PACU with all vital signs stable and vascular status intact. To be discharged per routine protocol.  Will follow up in approximately 1 week in the outpatient clinic.

## 2018-07-19 NOTE — Transfer of Care (Signed)
Immediate Anesthesia Transfer of Care Note  Patient: [name]  Procedure(s) Performed: TRANSMETATARSAL AMPUTATION (Left )  Patient Location: PACU  Anesthesia Type:General  Level of Consciousness: awake, alert  and oriented  Airway & Oxygen Therapy: Patient Spontanous Breathing and Patient connected to nasal cannula oxygen  Post-op Assessment: Report given to RN and Post -op Vital signs reviewed and stable  Post vital signs: Reviewed and stable  Last Vitals:  Vitals Value Taken Time  BP 149/65 07/19/2018  9:48 AM  Temp 37.1 C 07/19/2018  9:48 AM  Pulse 72 07/19/2018  9:49 AM  Resp 9 07/19/2018  9:49 AM  SpO2 99 % 07/19/2018  9:49 AM  Vitals shown include unvalidated device data.  Last Pain:  Vitals:   07/19/18 0016  TempSrc: Oral  PainSc: 0-No pain         Complications: No apparent anesthesia complications

## 2018-07-19 NOTE — Anesthesia Preprocedure Evaluation (Signed)
Anesthesia Evaluation  Patient identified by MRN, date of birth, ID band Patient awake    Reviewed: Allergy & Precautions, H&P , NPO status , Patient's Chart, lab work & pertinent test results, reviewed documented beta blocker date and time   History of Anesthesia Complications Negative for: history of anesthetic complications  Airway Mallampati: I  TM Distance: >3 FB Neck ROM: full    Dental  (+) Dental Advidsory Given, Edentulous Upper, Edentulous Lower, Upper Dentures, Lower Dentures   Pulmonary neg pulmonary ROS, former smoker,           Cardiovascular Exercise Tolerance: Good hypertension, (-) angina+ CAD  (-) Past MI, (-) Cardiac Stents and (-) CABG (-) dysrhythmias (-) Valvular Problems/Murmurs     Neuro/Psych negative neurological ROS  negative psych ROS   GI/Hepatic negative GI ROS, Neg liver ROS,   Endo/Other  diabetes  Renal/GU negative Renal ROS  negative genitourinary   Musculoskeletal   Abdominal   Peds  Hematology  (+) anemia ,   Anesthesia Other Findings Past Medical History: No date: Diabetes mellitus without complication (HCC)   Reproductive/Obstetrics negative OB ROS                             Anesthesia Physical  Anesthesia Plan  ASA: III  Anesthesia Plan: General   Post-op Pain Management:    Induction: Intravenous  PONV Risk Score and Plan: 2 and Ondansetron and Dexamethasone  Airway Management Planned: LMA  Additional Equipment:   Intra-op Plan:   Post-operative Plan: Extubation in OR  Informed Consent: I have reviewed the patients History and Physical, chart, labs and discussed the procedure including the risks, benefits and alternatives for the proposed anesthesia with the patient or authorized representative who has indicated his/her understanding and acceptance.   Dental Advisory Given  Plan Discussed with: Anesthesiologist, CRNA and  Surgeon  Anesthesia Plan Comments:         Anesthesia Quick Evaluation

## 2018-07-19 NOTE — Progress Notes (Signed)
Patient taking to the OR.

## 2018-07-19 NOTE — Anesthesia Procedure Notes (Signed)
Procedure Name: Intubation Date/Time: 07/19/2018 8:28 AM Performed by: Clinton Sawyer, CRNA Pre-anesthesia Checklist: Patient identified, Emergency Drugs available, Suction available, Patient being monitored and Timeout performed Patient Re-evaluated:Patient Re-evaluated prior to induction Oxygen Delivery Method: Circle system utilized Preoxygenation: Pre-oxygenation with 100% oxygen Induction Type: IV induction Ventilation: Mask ventilation without difficulty Laryngoscope Size: Mac and 4 Grade View: Grade I Tube type: Oral Tube size: 7.0 mm Number of attempts: 1 Airway Equipment and Method: Stylet Placement Confirmation: ETT inserted through vocal cords under direct vision,  positive ETCO2,  CO2 detector and breath sounds checked- equal and bilateral Secured at: 22 cm Tube secured with: Tape Dental Injury: Teeth and Oropharynx as per pre-operative assessment

## 2018-07-19 NOTE — Anesthesia Post-op Follow-up Note (Signed)
Anesthesia QCDR form completed.        

## 2018-07-19 NOTE — Anesthesia Postprocedure Evaluation (Signed)
Anesthesia Post Note  Patient: [name]  Procedure(s) Performed: TRANSMETATARSAL AMPUTATION (Left )  Patient location during evaluation: PACU Anesthesia Type: General Level of consciousness: awake and alert and oriented Pain management: pain level controlled Vital Signs Assessment: post-procedure vital signs reviewed and stable Respiratory status: spontaneous breathing Cardiovascular status: blood pressure returned to baseline Anesthetic complications: no     Last Vitals:  Vitals:   07/19/18 1402 07/19/18 1502  BP: (!) 117/55 (!) 130/57  Pulse: 68 66  Resp:    Temp:    SpO2: 98% 98%    Last Pain:  Vitals:   07/19/18 1051  TempSrc: Oral  PainSc:                  Sadako Cegielski

## 2018-07-20 ENCOUNTER — Encounter: Payer: Self-pay | Admitting: Podiatry

## 2018-07-20 LAB — BASIC METABOLIC PANEL
Anion gap: 6 (ref 5–15)
BUN: 23 mg/dL (ref 8–23)
CO2: 27 mmol/L (ref 22–32)
Calcium: 8.9 mg/dL (ref 8.9–10.3)
Chloride: 103 mmol/L (ref 98–111)
Creatinine, Ser: 1.2 mg/dL (ref 0.61–1.24)
GFR calc Af Amer: >60 mL/min (ref >60)
GFR calc non Af Amer: 57 mL/min — ABNORMAL LOW (ref >60)
GLUCOSE: 138 mg/dL — AB (ref 70–99)
POTASSIUM: 4.7 mmol/L (ref 3.5–5.1)
Sodium: 136 mmol/L (ref 135–145)

## 2018-07-20 LAB — GLUCOSE, CAPILLARY
GLUCOSE-CAPILLARY: 141 mg/dL — AB (ref 70–99)
Glucose-Capillary: 103 mg/dL — ABNORMAL HIGH (ref 70–99)
Glucose-Capillary: 152 mg/dL — ABNORMAL HIGH (ref 70–99)
Glucose-Capillary: 115 mg/dL — ABNORMAL HIGH (ref 70–99)

## 2018-07-20 LAB — CBC
HEMATOCRIT: 30 % — AB (ref 39.0–52.0)
Hemoglobin: 9.6 g/dL — ABNORMAL LOW (ref 13.0–17.0)
MCH: 29.4 pg (ref 26.0–34.0)
MCHC: 32 g/dL (ref 30.0–36.0)
MCV: 92 fL (ref 80.0–100.0)
Platelets: 238 10*3/uL (ref 150–400)
RBC: 3.26 MIL/uL — ABNORMAL LOW (ref 4.22–5.81)
RDW: 13.4 % (ref 11.5–15.5)
WBC: 9.2 10*3/uL (ref 4.0–10.5)
nRBC: 0 % (ref 0.0–0.2)

## 2018-07-20 LAB — VANCOMYCIN, TROUGH: Vancomycin Tr: 17 ug/mL (ref 15–20)

## 2018-07-20 NOTE — Progress Notes (Signed)
[facility]                                                      Patient Demographics  [name], is a 77 y.o. male   MRN: [id number]   DOB - 19-Mar-1941  Admit Date - 07/17/2018    Outpatient Primary MD for the patient is Harlon Flor, Jonnie Finner, PA-C  Consult requested in the Hospital by Altamese Dilling, *, On 07/20/2018    With History of -  Past Medical History:  Diagnosis Date  . Diabetes mellitus without complication [facility])       Past Surgical History:  Procedure Laterality Date  . AMPUTATION TOE Left 03/28/2018   Procedure: AMPUTATION TOE/ PARTIAL RAY RESECTION-LEFT 2ND AND 3RD;  Surgeon: Linus Galas, DPM;  Location: [facility] ORS;  Service: Podiatry;  Laterality: Left;  . HERNIA REPAIR    . LOWER EXTREMITY ANGIOGRAPHY Left 03/26/2018   Procedure: Lower Extremity Angiography;  Surgeon: Renford Dills, MD;  Location: [facility] INVASIVE CV LAB;  Service: Cardiovascular;  Laterality: Left;  . 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   Procedure: TRANSMETATARSAL AMPUTATION;  Surgeon: Recardo Evangelist, DPM;  Location: [facility] ORS;  Service: Podiatry;  Laterality: Left;   HPI  [name]  is a 77 y.o. 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    Review of Systems: He is alert well oriented and pleasant today.  States having some discomfort but is not too bad.  In addition to the HPI above,  No Fever-chills, No Headache, No changes with Vision or hearing, No problems swallowing food or Liquids, No Chest pain, Cough or Shortness of Breath, No Abdominal pain, No Nausea or Vommitting, Bowel movements are regular, No Blood in stool or Urine, No dysuria, No new skin rashes or bruises, No new joints pains-aches,  No new  weakness, tingling, numbness in any extremity, No recent weight gain or loss, No polyuria, polydypsia or polyphagia, No significant Mental Stressors.  A full 10 point Review of Systems was done, except as stated above, all other Review of Systems were negative.   Social History Social History   Tobacco Use  . Smoking status: Former Smoker    Types: Cigarettes  . Smokeless tobacco: Never Used  Substance Use Topics  . Alcohol use: Never    Frequency: Never    Family History History reviewed. No pertinent family history.  Prior to Admission medications   Medication Sig Start Date End Date Taking? Authorizing Provider  amLODipine (NORVASC) 2.5 MG tablet Take 2.5 mg by mouth daily. 03/14/18   [provider]  clopidogrel (PLAVIX) 75 MG tablet Take 1 tablet (75 mg total) by mouth daily. 03/30/18 03/30/19  Enid Baas, MD  lisinopril (PRINIVIL,ZESTRIL) 5 MG tablet Take 5 mg by mouth daily. 02/25/18   [provider]  metFORMIN (GLUCOPHAGE) 500 MG tablet Take 1,000 mg by mouth 2 (two) times daily. 02/25/18   [provider]  oxyCODONE (OXY IR/ROXICODONE) 5 MG immediate release tablet Take 1 tablet (5 mg total) by mouth every 6 (six) hours as needed for moderate pain or severe pain. Patient not taking: Reported on 04/28/2018 03/30/18   Enid Baas, MD  silver sulfADIAZINE (SILVADENE) 1 % cream APPLY TOPICALLY ONCE DAILY APPLY  3 TIMES A WEEK 04/28/18   [provider]  sulfamethoxazole-trimethoprim (BACTRIM DS,SEPTRA DS) 800-160 MG tablet Take by mouth. 04/17/18   [provider]    Anti-infectives (From admission, onward)   Start     Dose/Rate Route Frequency Ordered Stop   07/18/18 0800  ceFEPIme (MAXIPIME) 2 g in sodium chloride 0.9 % 100 mL IVPB     2 g 200 mL/hr over 30 Minutes Intravenous Every 12 hours 07/17/18 2136     07/18/18 0400  vancomycin (VANCOCIN) 1,500 mg in sodium chloride 0.9 % 500 mL IVPB     1,500 mg 250 mL/hr over  120 Minutes Intravenous Every 24 hours 07/17/18 2135     07/17/18 2000  metroNIDAZOLE (FLAGYL) IVPB 500 mg     500 mg 100 mL/hr over 60 Minutes Intravenous Every 8 hours 07/17/18 1819     07/17/18 1900  ceFEPIme (MAXIPIME) 2 g in sodium chloride 0.9 % 100 mL IVPB     2 g 200 mL/hr over 30 Minutes Intravenous  Once 07/17/18 1825 07/17/18 2057   07/17/18 1900  vancomycin (VANCOCIN) IVPB 1000 mg/200 mL premix     1,000 mg 200 mL/hr over 60 Minutes Intravenous  Once 07/17/18 1825 07/17/18 2232      Scheduled Meds: . amLODipine  2.5 mg Oral Daily  . Chlorhexidine Gluconate Cloth  6 each Topical Q0600  . heparin  5,000 Units Subcutaneous Q8H  . insulin aspart  0-9 Units Subcutaneous TID WC  . lisinopril  5 mg Oral Daily   Continuous Infusions: . ceFEPime (MAXIPIME) IV 2 g (07/19/18 2132)  . metronidazole 500 mg (07/19/18 1549)  . vancomycin 1,500 mg (07/20/18 8469)   PRN Meds:.acetaminophen, docusate sodium, oxyCODONE  Allergies  Allergen Reactions  . Flu Virus Vaccine Other (See Comments) and Nausea And Vomiting    Fever, chills, vomiting.     Physical Exam  Vitals  Blood pressure 140/63, pulse 74, temperature 98.7 F (37.1 C), temperature source Oral, resp. rate 19, height 5\' 8"  (1.727 m), weight 86.2 kg, SpO2 95 %.  Lower Extremity exam: Dressing was removed today and the incision margin is in excellent shape dry with no bleeding does not appear to be particularly inflamed margins are intact.  Drain was removed with approximately 5 cc of fluid removal in the tube. Data Review  CBC Recent Labs  Lab 07/17/18 1816 07/18/18 0502 07/20/18 0339  WBC 8.2 7.5 9.2  HGB 9.5* 8.7* 9.6*  HCT 29.7* 27.5* 30.0*  PLT 248 237 238  MCV 94.0 94.8 92.0  MCH 30.1 30.0 29.4  MCHC 32.0 31.6 32.0  RDW 13.4 13.6 13.4   ------------------------------------------------------------------------------------------------------------------  Chemistries  Recent Labs  Lab 07/17/18 1816  07/18/18 0502 07/20/18 0339  NA 137 138 136  K 5.4* 4.5 4.7  CL 105 105 103  CO2 25 26 27   GLUCOSE 112* 89 138*  BUN 24* 25* 23  CREATININE 1.41* 1.45* 1.20  CALCIUM 9.5 8.7* 8.9  AST 27  --   --   ALT 72*  --   --   ALKPHOS 83  --   --   BILITOT 0.4  --   --    ------------------------------------------------------------------------------- Imaging results:   Mr Foot Left Wo Contrast  Result Date: 07/18/2018 CLINICAL DATA:  Drainage and inflammation at the base of the great toe. Status post amputation of the second and third toes 3 months ago. EXAM: MRI OF THE LEFT FOOT WITHOUT CONTRAST TECHNIQUE: Multiplanar, multisequence MR imaging  of the left forefoot was performed. No intravenous contrast was administered. COMPARISON:  Same day radiographs of the foot. FINDINGS: Bones/Joint/Cartilage Abnormal marrow signal and edema involving the distal 4/5th of the first metatarsal with fracture at the base of the first proximal phalanx with marrow edema noted of the proximal and distal phalanges. Given soft tissue inflammatory change and fluid adjacent to the first MTP joint medially, findings are concerning for osteomyelitis. Minimal reactive edema versus osteomyelitis involving the surgical margins of the second and third metatarsals. The fourth and fifth rays as well as included midfoot are unremarkable. Ligaments Noncontributory Muscles and Tendons Forefoot myositis without pyomyositis. The flexor and extensor tendons demonstrate no tenosynovitis or unexpected disruption. Soft tissues Diffuse soft tissue edema of the forefoot consistent with cellulitis. More focal curvilinear fluid collection as described along the medial and dorsal aspect of the great toe. Suggestion of small cutaneous ulceration along the plantar lateral base of the great toe. IMPRESSION: 1. Findings concerning for osteomyelitis of the first ray with associated forefoot cellulitis, myositis and plantar cutaneous ulcer at the base of  the great toe. Small curvilinear fluid collection as described above at the level of the first MTP joint medially. Marrow edema involves the distal 4/5th of the first metatarsal with fracture at the base of the first proximal phalanx with marrow signal abnormality along its entirety. Minimal marrow edema at the base of the distal phalanx. 2. 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 Electronically Signed   By: Tollie Eth M.D.   On: 07/18/2018 14:49    Assessment & Plan: Change dressing today.  I will write a consult for physical therapy to see if he can get bathroom privileges using a walker and OrthoWedge shoe and partial weightbearing on the left foot.  Otherwise we will follow him tomorrow with Dr. Joylene Draft.  Soon as we can determine antibiotic therapy for him should be able to send him home with home health as needed.  Principal Problem:   Osteomyelitis [facility])   Family Communication: Plan discussed with patient   Recardo Evangelist M.D on 07/20/2018 at 11:28 AM  Thank you for the consult, we will follow the patient with you in the Hospital.

## 2018-07-20 NOTE — Consult Note (Signed)
Pharmacy Antibiotic Note  [name] is a 77 y.o. male admitted on 07/17/2018 with Osteomyelitis.  Pharmacy has been consulted for Cefepime and Vancomycin dosing. Patient received vancomycin 1g IV x 1 dose and Cefepime 2mg  IV x 1 dose.   Plan: Ke: 0.043   T1/2: 16.12   VD: 60.2  Continue Vancomycin 1500 IV every 24 hours with 6 hours stack dosing.  Goal trough 15-20 mcg/mL. Calculated trough at Css is 17. Trough level prior to 4th dose.   Continue Cefepime 2g IV every 12 hours   Height: 5\' 8"  (172.7 cm) Weight: 190 lb (86.2 kg) IBW/kg (Calculated) : 68.4  Temp (24hrs), Avg:98.6 F (37 C), Min:97.9 F (36.6 C), Max:99.3 F (37.4 C)  Recent Labs  Lab 07/17/18 1816 07/18/18 0502 07/20/18 0339  WBC 8.2 7.5 9.2  CREATININE 1.41* 1.45* 1.20  VANCOTROUGH  --   --  17    Estimated Creatinine Clearance: 55.1 mL/min (by C-G formula based on SCr of 1.2 mg/dL).    Allergies  Allergen Reactions  . Flu Virus Vaccine Other (See Comments) and Nausea And Vomiting    Fever, chills, vomiting.     Antimicrobials this admission: 10/31 vancomycin  >>  10/31 Cefepime >>  10/31 Metronidazole>>  Dose adjustments this admission: 11/03 0330 vanc level 17. Continue current regimen.  Microbiology results: 10/31 MRSA PCR: Positive  10/31 Wound cx: pending  Thank you for allowing pharmacy to be a part of this patient's care.  Erich Montane, PharmD Clinical Pharmacist 07/20/2018 4:10 AM

## 2018-07-21 DIAGNOSIS — I10 Essential (primary) hypertension: Secondary | ICD-10-CM

## 2018-07-21 DIAGNOSIS — E1151 Type 2 diabetes mellitus with diabetic peripheral angiopathy without gangrene: Secondary | ICD-10-CM

## 2018-07-21 DIAGNOSIS — E1169 Type 2 diabetes mellitus with other specified complication: Principal | ICD-10-CM

## 2018-07-21 DIAGNOSIS — Z79899 Other long term (current) drug therapy: Secondary | ICD-10-CM

## 2018-07-21 DIAGNOSIS — N179 Acute kidney failure, unspecified: Secondary | ICD-10-CM

## 2018-07-21 DIAGNOSIS — Z89422 Acquired absence of other left toe(s): Secondary | ICD-10-CM

## 2018-07-21 DIAGNOSIS — Z887 Allergy status to serum and vaccine status: Secondary | ICD-10-CM

## 2018-07-21 DIAGNOSIS — Z89412 Acquired absence of left great toe: Secondary | ICD-10-CM

## 2018-07-21 DIAGNOSIS — E785 Hyperlipidemia, unspecified: Secondary | ICD-10-CM

## 2018-07-21 DIAGNOSIS — M869 Osteomyelitis, unspecified: Secondary | ICD-10-CM

## 2018-07-21 DIAGNOSIS — Z87891 Personal history of nicotine dependence: Secondary | ICD-10-CM

## 2018-07-21 LAB — CBC
HEMATOCRIT: 29.6 % — AB (ref 39.0–52.0)
HEMOGLOBIN: 9.5 g/dL — AB (ref 13.0–17.0)
MCH: 30.1 pg (ref 26.0–34.0)
MCHC: 32.1 g/dL (ref 30.0–36.0)
MCV: 93.7 fL (ref 80.0–100.0)
Platelets: 231 10*3/uL (ref 150–400)
RBC: 3.16 MIL/uL — ABNORMAL LOW (ref 4.22–5.81)
RDW: 13.2 % (ref 11.5–15.5)
WBC: 9.3 10*3/uL (ref 4.0–10.5)
nRBC: 0 % (ref 0.0–0.2)

## 2018-07-21 LAB — GLUCOSE, CAPILLARY
GLUCOSE-CAPILLARY: 118 mg/dL — AB (ref 70–99)
GLUCOSE-CAPILLARY: 184 mg/dL — AB (ref 70–99)
Glucose-Capillary: 80 mg/dL (ref 70–99)
Glucose-Capillary: 95 mg/dL (ref 70–99)
Glucose-Capillary: 139 mg/dL — ABNORMAL HIGH (ref 70–99)

## 2018-07-21 LAB — BASIC METABOLIC PANEL
Anion gap: 8 (ref 5–15)
BUN: 21 mg/dL (ref 8–23)
CHLORIDE: 102 mmol/L (ref 98–111)
CO2: 26 mmol/L (ref 22–32)
Calcium: 9 mg/dL (ref 8.9–10.3)
Creatinine, Ser: 1.19 mg/dL (ref 0.61–1.24)
GFR calc Af Amer: >60 mL/min (ref >60)
GFR, EST NON AFRICAN AMERICAN: 57 mL/min — AB (ref >60)
GLUCOSE: 100 mg/dL — AB (ref 70–99)
Potassium: 4.6 mmol/L (ref 3.5–5.1)
Sodium: 136 mmol/L (ref 135–145)

## 2018-07-21 NOTE — Progress Notes (Signed)
[facility] [place] at [facility]   PATIENT NAME: [name]    MR#:  [id number]  DATE OF BIRTH:  July 10, 1941  SUBJECTIVE:  CHIEF COMPLAINT:  No chief complaint on file.  History of amputation on toes in the past, sent by podiatry clinic because of new findings of osteomyelitis and other toes. No complaints today.  s/p transmetatarsal amputation.  REVIEW OF SYSTEMS:  CONSTITUTIONAL: No fever, fatigue or weakness.  EYES: No blurred or double vision.  EARS, NOSE, AND THROAT: No tinnitus or ear pain.  RESPIRATORY: No cough, shortness of breath, wheezing or hemoptysis.  CARDIOVASCULAR: No chest pain, orthopnea, edema.  GASTROINTESTINAL: No nausea, vomiting, diarrhea or abdominal pain.  GENITOURINARY: No dysuria, hematuria.  ENDOCRINE: No polyuria, nocturia,  HEMATOLOGY: No anemia, easy bruising or bleeding SKIN: No rash or lesion. MUSCULOSKELETAL: No joint pain or arthritis.   NEUROLOGIC: No tingling, numbness, weakness.  PSYCHIATRY: No anxiety or depression.   ROS  DRUG ALLERGIES:   Allergies  Allergen Reactions  . Flu Virus Vaccine Other (See Comments) and Nausea And Vomiting    Fever, chills, vomiting.     VITALS:  Blood pressure 118/61, pulse 64, temperature 98.2 F (36.8 C), temperature source Oral, resp. rate 17, height 5\' 8"  (1.727 m), weight 86.2 kg, SpO2 95 %.  PHYSICAL EXAMINATION:  GENERAL:  77 y.o.-year-old patient lying in the bed with no acute distress.  EYES: Pupils equal, round, reactive to light and accommodation. No scleral icterus. Extraocular muscles intact.  HEENT: Head atraumatic, normocephalic. Oropharynx and nasopharynx clear.  NECK:  Supple, no jugular venous distention. No thyroid enlargement, no tenderness.  LUNGS: Normal breath sounds bilaterally, no wheezing, rales,rhonchi or crepitation. No use of accessory muscles of respiration.  CARDIOVASCULAR: S1, S2 normal. No murmurs, rubs, or gallops.  ABDOMEN: Soft, nontender,  nondistended. Bowel sounds present. No organomegaly or mass.  EXTREMITIES: No pedal edema, cyanosis, or clubbing.  Dressing on left foot. NEUROLOGIC: Cranial nerves II through XII are intact. Muscle strength 5/5 in all extremities. Sensation intact. Gait not checked.  PSYCHIATRIC: The patient is alert and oriented x 3.  SKIN: No obvious rash, lesion, or ulcer.   Physical Exam LABORATORY PANEL:   CBC Recent Labs  Lab 07/21/18 0418  WBC 9.3  HGB 9.5*  HCT 29.6*  PLT 231   ------------------------------------------------------------------------------------------------------------------  Chemistries  Recent Labs  Lab 07/17/18 1816  07/21/18 0418  NA 137   < > 136  K 5.4*   < > 4.6  CL 105   < > 102  CO2 25   < > 26  GLUCOSE 112*   < > 100*  BUN 24*   < > 21  CREATININE 1.41*   < > 1.19  CALCIUM 9.5   < > 9.0  AST 27  --   --   ALT 72*  --   --   ALKPHOS 83  --   --   BILITOT 0.4  --   --    < > = values in this interval not displayed.   ------------------------------------------------------------------------------------------------------------------  Cardiac Enzymes No results for input(s): TROPONINI in the last 168 hours. ------------------------------------------------------------------------------------------------------------------  RADIOLOGY:  No results found.  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 Amputation sample sent for Cx.  *  Diabetes We will hold oral medication and keep on sliding scale coverage Blood sugar is under control.  * Htn   Cont home meds-fairly controlled.  * Peripheral vascular disease  Cont plavix.  May need post surgical boot and PT eval.  All the records are reviewed and  case discussed with Care Management/Social Workerr. Management plans discussed with the patient, family and they are in agreement.  CODE STATUS: Full code.  TOTAL TIME TAKING CARE OF THIS PATIENT: 35 minutes.   Patient's wife present in the room.  POSSIBLE D/C IN 2-3 DAYS, DEPENDING ON CLINICAL CONDITION.   Altamese Dilling M.D on 07/21/2018   Between 7am to 6pm - Pager - [phone]  After 6pm go to [email or web address] - Social research officer, government  [facility]  Office  [phone]  CC: Primary care physician; Whitaker, Jonnie Finner, PA-C  Note: This dictation was prepared with Dragon dictation along with smaller phrase technology. Any transcriptional errors that result from this process are unintentional.

## 2018-07-21 NOTE — Evaluation (Signed)
Physical Therapy Evaluation Patient Details Name: [name] MRN: [id number] DOB: [birth year] Today's Date: 07/21/2018   History of Present Illness   Pt is a 77 y.o. male with a known history of diabetes mellitus and osteomyelitis of left foot who had ray resection in July 2019 and was on oral Bactrim since then.  Has also home visiting nurse for wound care 2 times a week.  The wound care nurse noted some new possible collections on his feet but patient did not had any fever or chills.  X-ray showed some bone changes at the base of the great toe and the metatarsal head region and pt diagnosed with osteomyelitis first metatarsal and great toe left foot.  Pt is now s/p transmetatarsal amputation left foot.  Assessment also includes DM, HTN, and PVD.     Clinical Impression  Pt presents with deficits in strength, transfers, gait, and activity tolerance.  Pt was Ind with bed mobility tasks with good effort and control.  Pt was CGA with transfers with extensive cues required for proper sequencing.  Pt was able to amb 5' with a RW and CGA with good stability but again required extensive verbal and visual cues for proper sequencing to maintain WB status.  Pt was impulsive during the session and demonstrated poor carry over regarding proper sequencing to ensure activity and WB restrictions were maintained.   Pt will benefit from PT services in a SNF setting upon discharge to safely address the above deficits and to ensure that all precautions are maintained while his wound is heeling.       Follow Up Recommendations SNF;Supervision for mobility/OOB    Equipment Recommendations  None recommended by PT    Recommendations for Other Services       Precautions / Restrictions Precautions Precautions: Fall Required Braces or Orthoses: Other Brace/Splint Other Brace/Splint: OrthoWedge shoe to the L foot with weight bearing  Restrictions Weight Bearing Restrictions: Yes LLE Weight Bearing: Partial  weight bearing LLE Partial Weight Bearing Percentage or Pounds: Per MD, pt is PWB but is to use a RW to take as much weight off of the L foot as possible  Other Position/Activity Restrictions: Amb only for toileting and transfers to recliner with the use of a RW      Mobility  Bed Mobility Overal bed mobility: Independent                Transfers Overall transfer level: Needs assistance Equipment used: Rolling walker (2 wheeled) Transfers: Sit to/from Stand Sit to Stand: Min guard         General transfer comment: Mod verbal cues for sequencing  Ambulation/Gait Ambulation/Gait assistance: Min guard Gait Distance (Feet): 5 Feet Assistive device: Rolling walker (2 wheeled) Gait Pattern/deviations: Step-to pattern Gait velocity: Decreased   General Gait Details: Mod to max verbal cues for sequencing to maintain PWB status to the LLE   Stairs            Wheelchair Mobility    Modified Rankin (Stroke Patients Only)       Balance Overall balance assessment: No apparent balance deficits (not formally assessed)                                           Pertinent Vitals/Pain Pain Assessment: 0-10 Pain Score: 4  Pain Location: L foot Pain Descriptors / Indicators: Sore Pain Intervention(s): Monitored during  session;Premedicated before session    Home Living Family/patient expects to be discharged to:: Private residence Living Arrangements: Spouse/significant other Available Help at Discharge: Family Type of Home: Mobile home Home Access: Ramped entrance     Home Layout: One level Home Equipment: Environmental consultant - 2 wheels;Cane - single point;Cane - quad      Prior Function Level of Independence: Independent with assistive device(s)         Comments: Ind amb in the home without an AD and with a SPC outdoors, no fall history, Ind with ADLs     Hand Dominance        Extremity/Trunk Assessment   Upper Extremity Assessment Upper  Extremity Assessment: Overall WFL for tasks assessed    Lower Extremity Assessment Lower Extremity Assessment: Generalized weakness       Communication   Communication: No difficulties  Cognition Arousal/Alertness: Awake/alert Behavior During Therapy: WFL for tasks assessed/performed Overall Cognitive Status: Within Functional Limits for tasks assessed                                 General Comments: Some difficulty with sequencing and higher level commands, impulsive      General Comments      Exercises Total Joint Exercises Ankle Circles/Pumps: AROM;Both;10 reps Quad Sets: Strengthening;Both;5 reps;10 reps Gluteal Sets: Strengthening;Both;5 reps;10 reps Heel Slides: AROM;Right;10 reps Hip ABduction/ADduction: AROM;Both;10 reps Straight Leg Raises: AROM;Both;10 reps Long Arc Quad: AROM;Both;10 reps Knee Flexion: AROM;Both;10 reps   Assessment/Plan    PT Assessment Patient needs continued PT services  PT Problem List Decreased strength;Decreased activity tolerance;Decreased knowledge of use of DME;Decreased knowledge of precautions       PT Treatment Interventions DME instruction;Gait training;Balance training;Therapeutic exercise;Therapeutic activities;Patient/family education    PT Goals (Current goals can be found in the Care Plan section)  Acute Rehab PT Goals Patient Stated Goal: To get up and move around better PT Goal Formulation: With patient Time For Goal Achievement: 08/03/18 Potential to Achieve Goals: Good    Frequency 7X/week   Barriers to discharge Decreased caregiver support;Inaccessible home environment      Co-evaluation               AM-PAC PT "6 Clicks" Daily Activity  Outcome Measure Difficulty turning over in bed (including adjusting bedclothes, sheets and blankets)?: None Difficulty moving from lying on back to sitting on the side of the bed? : None Difficulty sitting down on and standing up from a chair with arms  (e.g., wheelchair, bedside commode, etc,.)?: Unable Help needed moving to and from a bed to chair (including a wheelchair)?: A Little Help needed walking in hospital room?: A Little Help needed climbing 3-5 steps with a railing? : A Lot 6 Click Score: 17    End of Session Equipment Utilized During Treatment: Gait belt Activity Tolerance: Patient tolerated treatment well Patient left: in bed;with bed alarm set;with call bell/phone within reach;Other (comment)(Pt declined up in chair) Nurse Communication: Mobility status PT Visit Diagnosis: Difficulty in walking, not elsewhere classified (R26.2);Muscle weakness (generalized) (M62.81)    Time: 1610-9604 PT Time Calculation (min) (ACUTE ONLY): 30 min   Charges:   PT Evaluation $PT Eval Low Complexity: 1 Low PT Treatments $Therapeutic Exercise: 8-22 mins        D. Elly Modena PT, DPT 07/21/18, 12:26 PM

## 2018-07-21 NOTE — Progress Notes (Signed)
[facility] [place] [facility]   PATIENT NAME: [name]    MR#:  [id number]  DATE OF BIRTH:  [birth year]  SUBJECTIVE:  CHIEF COMPLAINT:  No chief complaint on file.  History of amputation on toes in the past, sent by podiatry clinic because of new findings of osteomyelitis and other toes. No complaints today.  s/p transmetatarsal amputation.  REVIEW OF SYSTEMS:  CONSTITUTIONAL: No fever, fatigue or weakness.  EYES: No blurred or double vision.  EARS, NOSE, AND THROAT: No tinnitus or ear pain.  RESPIRATORY: No cough, shortness of breath, wheezing or hemoptysis.  CARDIOVASCULAR: No chest pain, orthopnea, edema.  GASTROINTESTINAL: No nausea, vomiting, diarrhea or abdominal pain.  GENITOURINARY: No dysuria, hematuria.  ENDOCRINE: No polyuria, nocturia,  HEMATOLOGY: No anemia, easy bruising or bleeding SKIN: No rash or lesion. MUSCULOSKELETAL: No joint pain or arthritis.   NEUROLOGIC: No tingling, numbness, weakness.  PSYCHIATRY: No anxiety or depression.   ROS  DRUG ALLERGIES:   Allergies  Allergen Reactions  . Flu Virus Vaccine Other (See Comments) and Nausea And Vomiting    Fever, chills, vomiting.     VITALS:  Blood pressure 118/61, pulse 64, temperature 98.2 F (36.8 C), temperature source Oral, resp. rate 17, height 5\' 8"  (1.727 m), weight 86.2 kg, SpO2 95 %.  PHYSICAL EXAMINATION:  GENERAL:  77 y.o.-year-old patient lying in the bed with no acute distress.  EYES: Pupils equal, round, reactive to light and accommodation. No scleral icterus. Extraocular muscles intact.  HEENT: Head atraumatic, normocephalic. Oropharynx and nasopharynx clear.  NECK:  Supple, no jugular venous distention. No thyroid enlargement, no tenderness.  LUNGS: Normal breath sounds bilaterally, no wheezing, rales,rhonchi or crepitation. No use of accessory muscles of respiration.  CARDIOVASCULAR: S1, S2 normal. No murmurs, rubs, or gallops.  ABDOMEN: Soft, nontender,  nondistended. Bowel sounds present. No organomegaly or mass.  EXTREMITIES: No pedal edema, cyanosis, or clubbing.  Dressing on left foot. NEUROLOGIC: Cranial nerves II through XII are intact. Muscle strength 5/5 in all extremities. Sensation intact. Gait not checked.  PSYCHIATRIC: The patient is alert and oriented x 3.  SKIN: No obvious rash, lesion, or ulcer.   Physical Exam LABORATORY PANEL:   CBC Recent Labs  Lab 07/21/18 0418  WBC 9.3  HGB 9.5*  HCT 29.6*  PLT 231   ------------------------------------------------------------------------------------------------------------------  Chemistries  Recent Labs  Lab 07/17/18 1816  07/21/18 0418  NA 137   < > 136  K 5.4*   < > 4.6  CL 105   < > 102  CO2 25   < > 26  GLUCOSE 112*   < > 100*  BUN 24*   < > 21  CREATININE 1.41*   < > 1.19  CALCIUM 9.5   < > 9.0  AST 27  --   --   ALT 72*  --   --   ALKPHOS 83  --   --   BILITOT 0.4  --   --    < > = values in this interval not displayed.   ------------------------------------------------------------------------------------------------------------------  Cardiac Enzymes No results for input(s): TROPONINI in the last 168 hours. ------------------------------------------------------------------------------------------------------------------  RADIOLOGY:  No results found.  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 Amputation sample sent for Cx.  As per podiatry, need to get cx result and ID consult to decide Out pt ABx.  *Diabetes We will hold oral medication and keep on sliding scale coverage Blood sugar is under control.  * Htn   Cont home meds-fairly controlled.  * Peripheral vascular disease  Cont  plavix.  May need post surgical boot and PT eval. Partial weight bearing.  All the records are reviewed and case discussed with Care Management/Social Workerr. Management plans discussed with the patient, family and they are in agreement.  CODE STATUS: Full code.  TOTAL TIME TAKING CARE OF THIS PATIENT: 35 minutes.   Patient's wife present in the room.  POSSIBLE D/C IN 2-3 DAYS, DEPENDING ON CLINICAL CONDITION.   Altamese Dilling M.D on 07/21/2018   Between 7am to 6pm - Pager - [phone]  After 6pm go to [email or web address] - Social research officer, government  [facility] [place] [facility]  Office  [phone]  CC: Primary care physician; Whitaker, Jonnie Finner, PA-C  Note: This dictation was prepared with Dragon dictation along with smaller phrase technology. Any transcriptional errors that result from this process are unintentional.

## 2018-07-21 NOTE — Progress Notes (Signed)
[facility] Podiatry                                                      Patient Demographics  [name], is a 77 y.o. male   MRN: [id number]   DOB - 10-16-1940  Admit Date - 07/17/2018    Outpatient Primary MD for the patient is Harlon Flor, Jonnie Finner, PA-C  Consult requested in the Hospital by Altamese Dilling, *, On 07/21/2018  With History of -  Past Medical History:  Diagnosis Date  . Diabetes mellitus without complication [facility])       Past Surgical History:  Procedure Laterality Date  . AMPUTATION TOE Left 03/28/2018   Procedure: AMPUTATION TOE/ PARTIAL RAY RESECTION-LEFT 2ND AND 3RD;  Surgeon: Linus Galas, DPM;  Location: [facility] ORS;  Service: Podiatry;  Laterality: Left;  . HERNIA REPAIR    . LOWER EXTREMITY ANGIOGRAPHY Left 03/26/2018   Procedure: Lower Extremity Angiography;  Surgeon: Renford Dills, MD;  Location: [facility] INVASIVE CV LAB;  Service: Cardiovascular;  Laterality: Left;  . 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   Procedure: TRANSMETATARSAL AMPUTATION;  Surgeon: Recardo Evangelist, DPM;  Location: [facility] ORS;  Service: Podiatry;  Laterality: Left;    in for   No chief complaint on file.    HPI  [name]  is a 77 y.o. male, 2 days status post transmetatarsal amputation left foot  Prior to Admission medications   Medication Sig Start Date End Date Taking? Authorizing Provider  amLODipine (NORVASC) 2.5 MG tablet Take 2.5 mg by mouth daily. 03/14/18   [provider]  clopidogrel (PLAVIX) 75 MG tablet Take 1 tablet (75 mg total) by mouth daily. 03/30/18 03/30/19  Enid Baas, MD  lisinopril (PRINIVIL,ZESTRIL) 5 MG tablet Take 5 mg by mouth daily. 02/25/18   [provider]  metFORMIN (GLUCOPHAGE) 500 MG tablet Take 1,000 mg by mouth 2 (two) times daily. 02/25/18   [provider]  oxyCODONE  (OXY IR/ROXICODONE) 5 MG immediate release tablet Take 1 tablet (5 mg total) by mouth every 6 (six) hours as needed for moderate pain or severe pain. Patient not taking: Reported on 04/28/2018 03/30/18   Enid Baas, MD  silver sulfADIAZINE (SILVADENE) 1 % cream APPLY TOPICALLY ONCE DAILY APPLY 3 TIMES A WEEK 04/28/18   [provider]  sulfamethoxazole-trimethoprim (BACTRIM DS,SEPTRA DS) 800-160 MG tablet Take by mouth. 04/17/18   [provider]    Anti-infectives (From admission, onward)   Start     Dose/Rate Route Frequency Ordered Stop   07/18/18 0800  ceFEPIme (MAXIPIME) 2 g in sodium chloride 0.9 % 100 mL IVPB     2 g 200 mL/hr over 30 Minutes Intravenous Every 12 hours 07/17/18 2136     07/18/18 0400  vancomycin (VANCOCIN) 1,500 mg in sodium chloride 0.9 % 500 mL IVPB     1,500 mg 250 mL/hr over 120 Minutes Intravenous Every 24 hours 07/17/18 2135     07/17/18 2000  metroNIDAZOLE (FLAGYL) IVPB 500 mg     500 mg 100 mL/hr over 60 Minutes Intravenous Every 8 hours 07/17/18 1819     07/17/18 1900  ceFEPIme (MAXIPIME) 2 g in sodium chloride 0.9 % 100 mL IVPB     2 g 200 mL/hr over 30 Minutes Intravenous  Once 07/17/18 1825 07/17/18 2057   07/17/18 1900  vancomycin (VANCOCIN) IVPB 1000 mg/200 mL premix     1,000 mg 200 mL/hr over 60 Minutes Intravenous  Once 07/17/18 1825 07/17/18 2232      Scheduled Meds: . amLODipine  2.5 mg Oral Daily  . Chlorhexidine Gluconate Cloth  6 each Topical Q0600  . heparin  5,000 Units Subcutaneous Q8H  . insulin aspart  0-9 Units Subcutaneous TID WC  . lisinopril  5 mg Oral Daily   Continuous Infusions: . ceFEPime (MAXIPIME) IV 2 g (07/21/18 1041)  . metronidazole 500 mg (07/21/18 0749)  . vancomycin 1,500 mg (07/21/18 0442)   PRN Meds:.acetaminophen, docusate sodium, oxyCODONE  Allergies  Allergen Reactions  . Flu Virus Vaccine Other (See Comments) and Nausea And Vomiting    Fever, chills, vomiting.     Physical  Exam  Vitals  Blood pressure (!) 142/59, pulse 64, temperature 98.2 F (36.8 C), temperature source Oral, resp. rate 17, height 5\' 8"  (1.727 m), weight 86.2 kg, SpO2 95 %.  Lower Extremity exam: Dressing was changed and I examined the patient and with Dr. Joylene Draft.  Foot appears to be stable there is no heavy drainage no redness no evidence of consistent or persistent infection.  Did have osteo-in the first metatarsal and that is why asked Dr. Joylene Draft to take a look.  We await cultures on that.  Overall his foot is stable. Data Review  CBC Recent Labs  Lab 07/17/18 1816 07/18/18 0502 07/20/18 0339 07/21/18 0418  WBC 8.2 7.5 9.2 9.3  HGB 9.5* 8.7* 9.6* 9.5*  HCT 29.7* 27.5* 30.0* 29.6*  PLT 248 237 238 231  MCV 94.0 94.8 92.0 93.7  MCH 30.1 30.0 29.4 30.1  MCHC 32.0 31.6 32.0 32.1  RDW 13.4 13.6 13.4 13.2   ------------------------------------------------------------------------------------------------------------------  Chemistries  Recent Labs  Lab 07/17/18 1816 07/18/18 0502 07/20/18 0339 07/21/18 0418  NA 137 138 136 136  K 5.4* 4.5 4.7 4.6  CL 105 105 103 102  CO2 25 26 27 26   GLUCOSE 112* 89 138* 100*  BUN 24* 25* 23 21  CREATININE 1.41* 1.45* 1.20 1.19  CALCIUM 9.5 8.7* 8.9 9.0  AST 27  --   --   --   ALT 72*  --   --   --   ALKPHOS 83  --   --   --   BILITOT 0.4  --   --   --    ------------------------------------------------------------------------------- Assessment & Plan: Pleased with his progress so far we will see what the culture shows regarding the bone infection and plan antibiotic treatment with Dr. Joylene Draft according to those findings.  Physical therapy is post work with him today on getting up for bathroom privileges only.  He has an OrthoWedge shoe he must use and also a walker and he should do it with assistance.  Principal Problem:   Osteomyelitis [facility])   Family Communication: Plan discussed with patient   Recardo Evangelist M.D on  07/21/2018 at 12:51 PM  Thank you for the consult, we will follow the patient with you in the Hospital.

## 2018-07-21 NOTE — Consult Note (Signed)
NAME: [name]  DOB: 1941/02/01  MRN: [id number]  Date/Time: 07/21/2018 8:41 AM Subjective:  REASON FOR CONSULT:  ? [name] is a 77 y.o. male with a history of DM, ray excision left 2nd and 3 rd toe on 03/28/18 and on wound vac was admitted because of osteo and worsening wound. After July 2019 he was followed at the podiatrist office and had an xray of the foot on 07/17/18 which showed Lucency and cortical disruption at the base of the left great toe proximal phalanx could reflect posttraumatic fractures or osteomyelitis    He was admitted to the hospital and underwent TMA on 07/19/18. I am asked to see patient for antibiotic recommendation   PMH DM PVD HTN Hyperlipidemia  PSH Hernia repair Left 2/3 ray excision  surgery rt cheek       Allergies  Allergen Reactions  . Flu Virus Vaccine Other (See Comments) and Nausea And Vomiting    Fever, chills, vomiting.    SH Former smoker Occasional alcohol  FH DM- mother MI paternal grandfather Cancer sister DVT son  ? Current Facility-Administered Medications  Medication Dose Route Frequency Provider Last Rate Last Dose  . acetaminophen (TYLENOL) tablet 650 mg  650 mg Oral Q6H PRN Milagros Loll, MD   650 mg at 07/19/18 1958  . amLODipine (NORVASC) tablet 2.5 mg  2.5 mg Oral Daily Altamese Dilling, MD   2.5 mg at 07/20/18 1157  . ceFEPIme (MAXIPIME) 2 g in sodium chloride 0.9 % 100 mL IVPB  2 g Intravenous Q12H Hallaji, Sheema M, RPH 200 mL/hr at 07/20/18 2109 2 g at 07/20/18 2109  . Chlorhexidine Gluconate Cloth 2 % PADS 6 each  6 each Topical Q0600 Altamese Dilling, MD   6 each at 07/21/18 0443  . docusate sodium (COLACE) capsule 100 mg  100 mg Oral BID PRN Altamese Dilling, MD      . heparin injection 5,000 Units  5,000 Units Subcutaneous Q8H Altamese Dilling, MD   5,000 Units at 07/21/18 0440  . insulin aspart (novoLOG) injection 0-9 Units  0-9 Units Subcutaneous TID WC Altamese Dilling, MD   2 Units at 07/20/18 1159  . lisinopril (PRINIVIL,ZESTRIL) tablet 5 mg  5 mg Oral Daily Altamese Dilling, MD   5 mg at 07/20/18 1157  . metroNIDAZOLE (FLAGYL) IVPB 500 mg  500 mg Intravenous Q8H Altamese Dilling, MD 100 mL/hr at 07/21/18 0749 500 mg at 07/21/18 0749  . oxyCODONE (Oxy IR/ROXICODONE) immediate release tablet 5 mg  5 mg Oral Q6H PRN Altamese Dilling, MD      . vancomycin (VANCOCIN) 1,500 mg in sodium chloride 0.9 % 500 mL IVPB  1,500 mg Intravenous Q24H Hallaji, Mardene Speak, RPH 250 mL/hr at 07/21/18 0442 1,500 mg at 07/21/18 0442     Abtx:  Anti-infectives (From admission, onward)   Start     Dose/Rate Route Frequency Ordered Stop   07/18/18 0800  ceFEPIme (MAXIPIME) 2 g in sodium chloride 0.9 % 100 mL IVPB     2 g 200 mL/hr over 30 Minutes Intravenous Every 12 hours 07/17/18 2136     07/18/18 0400  vancomycin (VANCOCIN) 1,500 mg in sodium chloride 0.9 % 500 mL IVPB     1,500 mg 250 mL/hr over 120 Minutes Intravenous Every 24 hours 07/17/18 2135     07/17/18 2000  metroNIDAZOLE (FLAGYL) IVPB 500 mg     500 mg 100 mL/hr over 60 Minutes Intravenous Every 8 hours 07/17/18 1819  07/17/18 1900  ceFEPIme (MAXIPIME) 2 g in sodium chloride 0.9 % 100 mL IVPB     2 g 200 mL/hr over 30 Minutes Intravenous  Once 07/17/18 1825 07/17/18 2057   07/17/18 1900  vancomycin (VANCOCIN) IVPB 1000 mg/200 mL premix     1,000 mg 200 mL/hr over 60 Minutes Intravenous  Once 07/17/18 1825 07/17/18 2232      REVIEW OF SYSTEMS:  Const: negative fever, negative chills, negative weight loss Eyes: negative diplopia or visual changes, negative eye pain, tics rt eye ENT: negative coryza, negative sore throat Resp: negative cough, hemoptysis, dyspnea Cards: negative for chest pain, palpitations, lower extremity edema GU: negative for frequency, dysuria and hematuria GI: Negative for abdominal pain, diarrhea, bleeding, constipation Skin: negative for rash and  pruritus Heme: negative for easy bruising and gum/nose bleeding MS: negative for myalgias, arthralgias, back pain and muscle weakness Neurolo:negative for headaches, dizziness, vertigo, memory problems  Psych: negative for feelings of anxiety, depression  Endocrine:no polyuria or polydipsia Allergy/Immunology- no antibiotic or food allergy Flu vaccine? Objective:  VITALS:  BP 118/61 (BP Location: Right Arm)   Pulse 64   Temp 98.2 F (36.8 C) (Oral)   Resp 17   Ht 5\' 8"  (1.727 m)   Wt 86.2 kg   SpO2 95%   BMI 28.89 kg/m  PHYSICAL EXAM:  General: Alert, cooperative, no distress, appears stated age.  Head: Normocephalic, without obvious abnormality, atraumatic. Eyes: Conjunctivae clear, anicteric sclerae. Pupils are equal, involuntary twitching around rt eye ENT Nares normal. No drainage or sinus tenderness. Lips, mucosa, and tongue normal. No Thrush Neck: Supple, symmetrical, no adenopathy, thyroid: non tender no carotid bruit and no JVD. Back: No CVA tenderness. Lungs: Clear to auscultation bilaterally. No Wheezing or Rhonchi. No rales. Heart: Regular rate and rhythm, no murmur, rub or gallop. Abdomen: Soft, non-tender,not distended. Bowel sounds normal. No masses Extremities: left foot TMA - clean with no erythema    Skin: No rashes or lesions. Or bruising Lymph: Cervical, supraclavicular normal. Neurologic: Grossly non-focal Pertinent Labs Lab Results CBC    Component Value Date/Time   WBC 9.3 07/21/2018 0418   RBC 3.16 (L) 07/21/2018 0418   HGB 9.5 (L) 07/21/2018 0418   HCT 29.6 (L) 07/21/2018 0418   PLT 231 07/21/2018 0418   MCV 93.7 07/21/2018 0418   MCH 30.1 07/21/2018 0418   MCHC 32.1 07/21/2018 0418   RDW 13.2 07/21/2018 0418   LYMPHSABS 2.9 03/30/2018 0444   MONOABS 0.8 03/30/2018 0444   EOSABS 0.5 03/30/2018 0444   BASOSABS 0.1 03/30/2018 0444    CMP Latest Ref Rng & Units 07/21/2018 07/20/2018 07/18/2018  Glucose 70 - 99 mg/dL 086(V) 784(O) 89  BUN  8 - 23 mg/dL 21 23 96(E)  Creatinine 0.61 - 1.24 mg/dL 9.52 8.41 3.24(M)  Sodium 135 - 145 mmol/L 136 136 138  Potassium 3.5 - 5.1 mmol/L 4.6 4.7 4.5  Chloride 98 - 111 mmol/L 102 103 105  CO2 22 - 32 mmol/L 26 27 26   Calcium 8.9 - 10.3 mg/dL 9.0 8.9 0.1(U)  Total Protein 6.5 - 8.1 g/dL - - -  Total Bilirubin 0.3 - 1.2 mg/dL - - -  Alkaline Phos 38 - 126 U/L - - -  AST 15 - 41 U/L - - -  ALT 0 - 44 U/L - - -      Microbiology:surgical culture MRSA  IMAGING RESULTS: ?MRI reviewed personally Findings concerning for osteomyelitis of the first ray with associated forefoot cellulitis, myositis  and plantar cutaneous ulcer at the base of the great toe. Small curvilinear fluid collection as described above at the level of the first MTP joint medially. Marrow edema involves the distal 4/5th of the first metatarsal with fracture at the base of the first proximal phalanx with marrow signal abnormality along its entirety. Minimal marrow edema at the base of the distal phalanx.  Impression/Recommendation ?77 y.o. male with a history of DM, ray excision left 2nd and 3 rd toe on 03/28/18 and on wound vac was admitted because of osteo and worsening wound. After July 2019 he was followed at the podiatrist office and had an xray of the foot on 07/17/18 which showed Lucency and cortical disruption at the base of the left great toe proximal phalanx could reflect posttraumatic fractures or osteomyelitis   Left foot wound and osteomyelitis- s/p TMA- TMA site looks good MRSA in prelim culture- will wait for finalization of culture and will wait for pathology to see whether the proximal margin is clear of osteo- as per Dr.Troxler he will need IV antibiotics. Currently on vanco/cefepime and flagyl  ? DM on sliding scale  HTN on amlodipine and lisinopril  AKI on ckd- improving ? Pt seen with Dr.Troxler ________________________________________________ Discussed with patient,

## 2018-07-21 NOTE — Plan of Care (Signed)

## 2018-07-21 NOTE — Consult Note (Signed)
Pharmacy Antibiotic Note  [name] is a 77 y.o. male admitted on 07/17/2018 with Osteomyelitis.  Pharmacy has been consulted for Cefepime and Vancomycin dosing. Patient received vancomycin 1g IV x 1 dose and Cefepime 2mg  IV x 1 dose.   Plan: Ke: 0.043   T1/2: 16.12   VD: 60.2  Continue Vancomycin 1500 IV every 24 hours. Goal trough 15-20 mcg/mL. Calculated trough at Css is 17. Trough on 11/03 was 17 (within goal). Goal trough 15-20. Level was ~23 hours post dose. Will continue current dose. No major change in renal function. Obtain level in 4-5 days or if there is a clinical change.   Continue Cefepime 2g IV every 12 hours.    Height: 5\' 8"  (172.7 cm) Weight: 190 lb (86.2 kg) IBW/kg (Calculated) : 68.4  Temp (24hrs), Avg:98.7 F (37.1 C), Min:98.4 F (36.9 C), Max:98.9 F (37.2 C)  Recent Labs  Lab 07/17/18 1816 07/18/18 0502 07/20/18 0339 07/21/18 0418  WBC 8.2 7.5 9.2 9.3  CREATININE 1.41* 1.45* 1.20 1.19  VANCOTROUGH  --   --  17  --     Estimated Creatinine Clearance: 55.5 mL/min (by C-G formula based on SCr of 1.19 mg/dL).    Allergies  Allergen Reactions  . Flu Virus Vaccine Other (See Comments) and Nausea And Vomiting    Fever, chills, vomiting.     Antimicrobials this admission: 10/31 vancomycin  >>  10/31 Cefepime >>  10/31 Metronidazole>>  Dose adjustments this admission: 11/03 0330 vanc level 17. Continue current regimen.  Microbiology results: 10/31 MRSA PCR: Positive  10/31 Wound cx: FEW METHICILLIN RESISTANT STAPHYLOCOCCUS AUREUS 11/2 Bone tissue cx: FEW STAPHYLOCOCCUS AUREUS  Thank you for allowing pharmacy to be a part of this patient's care.  Ronnald Ramp, PharmD Clinical Pharmacist 07/21/2018 7:23 AM

## 2018-07-22 ENCOUNTER — Inpatient Hospital Stay: Payer: Self-pay

## 2018-07-22 LAB — GLUCOSE, CAPILLARY
GLUCOSE-CAPILLARY: 138 mg/dL — AB (ref 70–99)
GLUCOSE-CAPILLARY: 141 mg/dL — AB (ref 70–99)
Glucose-Capillary: 99 mg/dL (ref 70–99)
Glucose-Capillary: 135 mg/dL — ABNORMAL HIGH (ref 70–99)

## 2018-07-22 LAB — AEROBIC/ANAEROBIC CULTURE (SURGICAL/DEEP WOUND)

## 2018-07-22 LAB — AEROBIC/ANAEROBIC CULTURE W GRAM STAIN (SURGICAL/DEEP WOUND)

## 2018-07-22 MED ORDER — SODIUM CHLORIDE 0.9% FLUSH
10.0000 mL | Freq: Two times a day (BID) | INTRAVENOUS | Status: DC
  Administered 2018-07-22 – 2018-07-23 (×3): 10 mL

## 2018-07-22 MED ORDER — MENTHOL 3 MG MT LOZG
1.0000 | LOZENGE | OROMUCOSAL | Status: DC | PRN
  Filled 2018-07-22 (×2): qty 9

## 2018-07-22 MED ORDER — SODIUM CHLORIDE 0.9% FLUSH: 10.0000 mL | INTRAVENOUS | Status: DC | PRN

## 2018-07-22 NOTE — Plan of Care (Signed)

## 2018-07-22 NOTE — Progress Notes (Signed)
[employer] - [place] at [facility]   PATIENT NAME: [name]    MR#:  [id number]  DATE OF BIRTH:  1941-03-15  SUBJECTIVE:  CHIEF COMPLAINT:  No chief complaint on file.  History of amputation on toes in the past, sent by podiatry clinic because of new findings of osteomyelitis and other toes. No complaints today.  s/p transmetatarsal amputation.  REVIEW OF SYSTEMS:  CONSTITUTIONAL: No fever, fatigue or weakness.  EYES: No blurred or double vision.  EARS, NOSE, AND THROAT: No tinnitus or ear pain.  RESPIRATORY: No cough, shortness of breath, wheezing or hemoptysis.  CARDIOVASCULAR: No chest pain, orthopnea, edema.  GASTROINTESTINAL: No nausea, vomiting, diarrhea or abdominal pain.  GENITOURINARY: No dysuria, hematuria.  ENDOCRINE: No polyuria, nocturia,  HEMATOLOGY: No anemia, easy bruising or bleeding SKIN: No rash or lesion. MUSCULOSKELETAL: No joint pain or arthritis.   NEUROLOGIC: No tingling, numbness, weakness.  PSYCHIATRY: No anxiety or depression.   ROS  DRUG ALLERGIES:   Allergies  Allergen Reactions  . Flu Virus Vaccine Other (See Comments) and Nausea And Vomiting    Fever, chills, vomiting.     VITALS:  Blood pressure 131/66, pulse 69, temperature 98.4 F (36.9 C), temperature source Oral, resp. rate 18, height 5\' 8"  (1.727 m), weight 86.2 kg, SpO2 98 %.  PHYSICAL EXAMINATION:  GENERAL:  77 y.o.-year-old patient lying in the bed with no acute distress.  EYES: Pupils equal, round, reactive to light and accommodation. No scleral icterus. Extraocular muscles intact.  HEENT: Head atraumatic, normocephalic. Oropharynx and nasopharynx clear.  NECK:  Supple, no jugular venous distention. No thyroid enlargement, no tenderness.  LUNGS: Normal breath sounds bilaterally, no wheezing, rales,rhonchi or crepitation. No use of accessory muscles of respiration.  CARDIOVASCULAR: S1, S2 normal. No murmurs, rubs, or gallops.  ABDOMEN: Soft, nontender,  nondistended. Bowel sounds present. No organomegaly or mass.  EXTREMITIES: No pedal edema, cyanosis, or clubbing.  Dressing on left foot. NEUROLOGIC: Cranial nerves II through XII are intact. Muscle strength 5/5 in all extremities. Sensation intact. Gait not checked.  PSYCHIATRIC: The patient is alert and oriented x 3.  SKIN: No obvious rash, lesion, or ulcer.   Physical Exam LABORATORY PANEL:   CBC Recent Labs  Lab 07/21/18 0418  WBC 9.3  HGB 9.5*  HCT 29.6*  PLT 231   ------------------------------------------------------------------------------------------------------------------  Chemistries  Recent Labs  Lab 07/17/18 1816  07/21/18 0418  NA 137   < > 136  K 5.4*   < > 4.6  CL 105   < > 102  CO2 25   < > 26  GLUCOSE 112*   < > 100*  BUN 24*   < > 21  CREATININE 1.41*   < > 1.19  CALCIUM 9.5   < > 9.0  AST 27  --   --   ALT 72*  --   --   ALKPHOS 83  --   --   BILITOT 0.4  --   --    < > = values in this interval not displayed.   ------------------------------------------------------------------------------------------------------------------  Cardiac Enzymes No results for input(s): TROPONINI in the last 168 hours. ------------------------------------------------------------------------------------------------------------------  RADIOLOGY:  [place] Ekg Site Rite  Result Date: 07/22/2018 If Site Rite image not attached, placement could not be confirmed due to current cardiac rhythm.   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 Amputation sample sent for Cx.  As per podiatry, need to get cx result and ID consult to decide Out pt ABx. Likely need IV antibiotic, get PICC line today.  *Diabetes with complications We  will hold oral medication and keep on sliding scale coverage Blood sugar is under control.  * Htn   Cont home meds-fairly controlled.  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 He had bilateral angioplasty done in July 2019  * s/p amputation Continue to monitor, may need wound care and physical therapy and follow-up with podiatry clinic.  May need post surgical boot and PT eval. Partial weight bearing. PT suggested rehab.  Patient was suggesting he is strong and he want to go home, his wife was there I discussed with them and encouraged him to work with physical therapy again and make their best choice for his safety.  All the records are reviewed and case discussed with Care Management/Social Workerr. Management plans discussed with the patient, family and they are in agreement.  CODE STATUS: Full code.  TOTAL TIME TAKING CARE OF THIS PATIENT: 35 minutes.   Patient's wife present in the room.  POSSIBLE D/C IN 1-2 DAYS, DEPENDING ON CLINICAL CONDITION.   Altamese Dilling M.D on 07/22/2018   Between 7am to 6pm - Pager - [phone]  After 6pm go to [email or web address] - Social research officer, government  [facility]  Office  [phone]  CC: Primary care physician; Whitaker, Jonnie Finner, PA-C  Note: This dictation was prepared with Dragon dictation along with smaller phrase technology. Any transcriptional errors that result from this process are unintentional.

## 2018-07-22 NOTE — Progress Notes (Signed)
Peripherally Inserted Central Catheter/Midline Placement  The IV Nurse has discussed with the patient and/or persons authorized to consent for the patient, the purpose of this procedure and the potential benefits and risks involved with this procedure.  The benefits include less needle sticks, lab draws from the catheter, and the patient may be discharged home with the catheter. Risks include, but not limited to, infection, bleeding, blood clot (thrombus formation), and puncture of an artery; nerve damage and irregular heartbeat and possibility to perform a PICC exchange if needed/ordered by physician.  Alternatives to this procedure were also discussed.  Bard Power PICC patient education guide, fact sheet on infection prevention and patient information card has been provided to patient /or left at bedside.    PICC/Midline Placement Documentation  PICC Single Lumen 07/22/18 PICC Right Basilic 38 cm (Active)  Indication for Insertion or Continuance of Line Home intravenous therapies (PICC only) 07/22/2018  3:00 PM  Exposed Catheter (cm) 0 cm 07/22/2018  3:00 PM  Site Assessment Clean;Dry;Intact 07/22/2018  3:00 PM  Line Status Flushed;Blood return noted 07/22/2018  3:00 PM  Dressing Type Transparent 07/22/2018  3:00 PM  Dressing Status Clean;Dry;Intact;Antimicrobial disc in place 07/22/2018  3:00 PM  Dressing Change Due 07/29/18 07/22/2018  3:00 PM       Stacie Glaze Horton 07/22/2018, 3:32 PM

## 2018-07-22 NOTE — Clinical Social Work Placement (Signed)
   CLINICAL SOCIAL WORK PLACEMENT  NOTE  Date:  07/22/2018  Patient Details  Name: [name] MRN: [id number] Date of Birth: 10-29-1940  Clinical Social Work is seeking post-discharge placement for this patient at the Skilled  Nursing Facility level of care (*CSW will initial, date and re-position this form in  chart as items are completed):  Yes   Patient/family provided with [place] Clinical Social Work Department's list of facilities offering this level of care within the geographic area requested by the patient (or if unable, by the patient's family).  Yes   Patient/family informed of their freedom to choose among providers that offer the needed level of care, that participate in Medicare, Medicaid or managed care program needed by the patient, have an available bed and are willing to accept the patient.  Yes   Patient/family informed of [place]'s ownership interest in [facility] and [facility], as well as of the fact that they are under no obligation to receive care at these facilities.  PASRR submitted to EDS on 07/22/18     PASRR number received on 07/22/18     Existing PASRR number confirmed on       FL2 transmitted to all facilities in geographic area requested by pt/family on 07/22/18     FL2 transmitted to all facilities within larger geographic area on       Patient informed that his/her managed care company has contracts with or will negotiate with certain facilities, including the following:        Yes   Patient/family informed of bed offers received.  Patient chooses bed at ([facility] )     Physician recommends and patient chooses bed at      Patient to be transferred to   on  .  Patient to be transferred to facility by       Patient family notified on   of transfer.  Name of family member notified:        PHYSICIAN       Additional Comment:    _______________________________________________ Katlin Bortner, Darleen Crocker, LCSW 07/22/2018, 3:36  PM

## 2018-07-22 NOTE — Progress Notes (Signed)
Physical Therapy Treatment Patient Details Name: [name] MRN: [id number] DOB: December 28, 1940 Today's Date: 07/22/2018    History of Present Illness  Pt is a 77 y.o. male with a known history of diabetes mellitus and osteomyelitis of left foot who had ray resection in July 2019 and was on oral Bactrim since then.  Has also home visiting nurse for wound care 2 times a week.  The wound care nurse noted some new possible collections on his feet but patient did not had any fever or chills.  X-ray showed some bone changes at the base of the great toe and the metatarsal head region and pt diagnosed with osteomyelitis first metatarsal and great toe left foot.  Pt is now s/p transmetatarsal amputation left foot.  Assessment also includes DM, HTN, and PVD.     PT Comments    Patient alert and agreeable to PT at start of session, requesting to use the commode. Post op shoe in place throughout mobility. Patient demonstrated bed mobility independently though increased time needed. Sit <> stand transfer x3 during session to include commode with RW and verbal cues for PWB. MinAx1 for toileting needs. Patient up in chair at end of session with all needs in reach. Current discharge plan remains appropriate due to safety concerns with PWB status, decreased caregiver support and change from PLOF.   Follow Up Recommendations        Equipment Recommendations  None recommended by PT    Recommendations for Other Services       Precautions / Restrictions Precautions Precautions: Fall Required Braces or Orthoses: Other Brace/Splint Other Brace/Splint: OrthoWedge shoe to the L foot with weight bearing  Restrictions Weight Bearing Restrictions: Yes LLE Weight Bearing: Partial weight bearing LLE Partial Weight Bearing Percentage or Pounds: Per MD, pt is PWB but is to use a RW to take as much weight off of the L foot as possible  Other Position/Activity Restrictions: Amb only for toileting and transfers to  recliner with the use of a RW    Mobility  Bed Mobility Overal bed mobility: Independent                Transfers Overall transfer level: Needs assistance Equipment used: Rolling walker (2 wheeled) Transfers: Sit to/from Stand Sit to Stand: Min guard         General transfer comment: less cues needed for sequencing, extended time needed.  Ambulation/Gait Ambulation/Gait assistance: Min guard Gait Distance (Feet): 5 Feet Assistive device: Rolling walker (2 wheeled) Gait Pattern/deviations: Step-to pattern Gait velocity: Decreased   General Gait Details: min cues to maintain PWB on LLE   Stairs             Wheelchair Mobility    Modified Rankin (Stroke Patients Only)       Balance                                            Cognition Arousal/Alertness: Awake/alert Behavior During Therapy: WFL for tasks assessed/performed Overall Cognitive Status: Within Functional Limits for tasks assessed                                 General Comments: Some difficulty with sequencing and higher level commands, impulsive      Exercises Other Exercises Other Exercises: Pt demonstrated commode transfer  with CGA, minAx1 for toileting assist    General Comments        Pertinent Vitals/Pain Pain Assessment: No/denies pain    Home Living                      Prior Function            PT Goals (current goals can now be found in the care plan section) Progress towards PT goals: Progressing toward goals    Frequency    7X/week      PT Plan Current plan remains appropriate    Co-evaluation              AM-PAC PT "6 Clicks" Daily Activity  Outcome Measure  Difficulty turning over in bed (including adjusting bedclothes, sheets and blankets)?: None Difficulty moving from lying on back to sitting on the side of the bed? : None Difficulty sitting down on and standing up from a chair with arms (e.g.,  wheelchair, bedside commode, etc,.)?: Unable Help needed moving to and from a bed to chair (including a wheelchair)?: A Little Help needed walking in hospital room?: A Little Help needed climbing 3-5 steps with a railing? : A Lot 6 Click Score: 17    End of Session Equipment Utilized During Treatment: Gait belt Activity Tolerance: Patient tolerated treatment well Patient left: in chair;with chair alarm set;with family/visitor present(heels elevated) Nurse Communication: Mobility status PT Visit Diagnosis: Difficulty in walking, not elsewhere classified (R26.2);Muscle weakness (generalized) (M62.81)     Time: 2956-2130 PT Time Calculation (min) (ACUTE ONLY): 28 min  Charges:  $Therapeutic Activity: 23-37 mins                     Olga Coaster PT, DPT 12:52 PM,07/22/18 [phone]

## 2018-07-22 NOTE — [place] FL2 (Signed)
[place] MEDICAID FL2 LEVEL OF CARE SCREENING TOOL     IDENTIFICATION  Patient Name: [name] Birthdate: Sep 15, 1941 Sex: male Admission Date (Current Location): 07/17/2018  [place] and [place] Number:  Chiropodist and Address:  [facility], [address], [place], [place] [address]      Provider Number: [id number]  Attending Physician Name and Address:  Altamese Dilling, *  Relative Name and Phone Number:       Current Level of Care: Hospital Recommended Level of Care: Skilled Nursing Facility Prior Approval Number:    Date Approved/Denied:   PASRR Number: [id number])  Discharge Plan: SNF    Current Diagnoses: Patient Active Problem List   Diagnosis Date Noted  . 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  . Atherosclerosis of native arteries of the extremities with ulceration (HCC) 04/28/2018  . Diabetes (HCC) 04/28/2018  . Essential hypertension 04/28/2018  . Diabetic foot infection (HCC) 03/24/2018    Orientation RESPIRATION BLADDER Height & Weight     Self, Time, Situation, Place  Normal Continent Weight: 190 lb (86.2 kg) Height:  5\' 8"  (172.7 cm)  BEHAVIORAL SYMPTOMS/MOOD NEUROLOGICAL BOWEL NUTRITION STATUS      Continent Diet(Diet: Carb Modified. )  AMBULATORY STATUS COMMUNICATION OF NEEDS Skin   Extensive Assist Verbally Surgical wounds(Incision: Left Foot. Needs IV ABX. )                       Personal Care Assistance Level of Assistance  Bathing, Feeding, Dressing Bathing Assistance: Limited assistance Feeding assistance: Independent Dressing Assistance: Limited assistance     Functional Limitations Info  Sight, Hearing, Speech Sight Info: Impaired Hearing Info: Adequate Speech Info: Adequate    SPECIAL CARE FACTORS FREQUENCY  PT (By licensed PT), OT (By licensed OT)     PT Frequency: (5) OT Frequency: (5)            Contractures      Additional Factors Info  Code Status, Allergies,  Isolation Precautions Code Status Info: (Full Code. ) Allergies Info: (Flu Virus Vaccine)     Isolation Precautions Info: (MRSA in left foot wound and Nasal Swab. )     Current Medications (07/22/2018):  This is the current hospital active medication list Current Facility-Administered Medications  Medication Dose Route Frequency Provider Last Rate Last Dose  . acetaminophen (TYLENOL) tablet 650 mg  650 mg Oral Q6H PRN Milagros Loll, MD   650 mg at 07/21/18 1036  . amLODipine (NORVASC) tablet 2.5 mg  2.5 mg Oral Daily Altamese Dilling, MD   2.5 mg at 07/22/18 0905  . ceFEPIme (MAXIPIME) 2 g in sodium chloride 0.9 % 100 mL IVPB  2 g Intravenous Q12H Hallaji, Sheema M, RPH 200 mL/hr at 07/22/18 0908 2 g at 07/22/18 0908  . Chlorhexidine Gluconate Cloth 2 % PADS 6 each  6 each Topical Q0600 Altamese Dilling, MD   6 each at 07/21/18 0443  . docusate sodium (COLACE) capsule 100 mg  100 mg Oral BID PRN Altamese Dilling, MD      . heparin injection 5,000 Units  5,000 Units Subcutaneous Q8H Altamese Dilling, MD   5,000 Units at 07/22/18 0514  . insulin aspart (novoLOG) injection 0-9 Units  0-9 Units Subcutaneous TID WC Altamese Dilling, MD   1 Units at 07/21/18 1201  . lisinopril (PRINIVIL,ZESTRIL) tablet 5 mg  5 mg Oral Daily Altamese Dilling, MD   5 mg at 07/22/18 0905  . menthol-cetylpyridinium (CEPACOL) lozenge 3 mg  1 lozenge Oral PRN Altamese Dilling, MD      . metroNIDAZOLE (FLAGYL) IVPB 500 mg  500 mg Intravenous Q8H Altamese Dilling, MD 100 mL/hr at 07/22/18 0721 500 mg at 07/22/18 0721  . oxyCODONE (Oxy IR/ROXICODONE) immediate release tablet 5 mg  5 mg Oral Q6H PRN Altamese Dilling, MD      . vancomycin (VANCOCIN) 1,500 mg in sodium chloride 0.9 % 500 mL IVPB  1,500 mg Intravenous Q24H Hallaji, Mardene Speak, RPH 250 mL/hr at 07/22/18 0515 1,500 mg at 07/22/18 0515     Discharge Medications: Please see discharge summary for a list of  discharge medications.  Relevant Imaging Results:  Relevant Lab Results:   Additional Information (SSN: [id number])  Gianni Mihalik, Darleen Crocker, LCSW

## 2018-07-22 NOTE — Progress Notes (Signed)
[employer] - [place] at [facility]   PATIENT NAME: [name]    MR#:  [id number]  DATE OF BIRTH:  Feb 24, 1941  SUBJECTIVE:  CHIEF COMPLAINT:  No chief complaint on file.  History of amputation on toes in the past, sent by podiatry clinic because of new findings of osteomyelitis and other toes. No complaints today.  s/p transmetatarsal amputation.  REVIEW OF SYSTEMS:  CONSTITUTIONAL: No fever, fatigue or weakness.  EYES: No blurred or double vision.  EARS, NOSE, AND THROAT: No tinnitus or ear pain.  RESPIRATORY: No cough, shortness of breath, wheezing or hemoptysis.  CARDIOVASCULAR: No chest pain, orthopnea, edema.  GASTROINTESTINAL: No nausea, vomiting, diarrhea or abdominal pain.  GENITOURINARY: No dysuria, hematuria.  ENDOCRINE: No polyuria, nocturia,  HEMATOLOGY: No anemia, easy bruising or bleeding SKIN: No rash or lesion. MUSCULOSKELETAL: No joint pain or arthritis.   NEUROLOGIC: No tingling, numbness, weakness.  PSYCHIATRY: No anxiety or depression.   ROS  DRUG ALLERGIES:   Allergies  Allergen Reactions  . Flu Virus Vaccine Other (See Comments) and Nausea And Vomiting    Fever, chills, vomiting.     VITALS:  Blood pressure 124/60, pulse 68, temperature 98.3 F (36.8 C), temperature source Oral, resp. rate 18, height 5\' 8"  (1.727 m), weight 86.2 kg, SpO2 97 %.  PHYSICAL EXAMINATION:  GENERAL:  77 y.o.-year-old patient lying in the bed with no acute distress.  EYES: Pupils equal, round, reactive to light and accommodation. No scleral icterus. Extraocular muscles intact.  HEENT: Head atraumatic, normocephalic. Oropharynx and nasopharynx clear.  NECK:  Supple, no jugular venous distention. No thyroid enlargement, no tenderness.  LUNGS: Normal breath sounds bilaterally, no wheezing, rales,rhonchi or crepitation. No use of accessory muscles of respiration.  CARDIOVASCULAR: S1, S2 normal. No murmurs, rubs, or gallops.  ABDOMEN: Soft, nontender,  nondistended. Bowel sounds present. No organomegaly or mass.  EXTREMITIES: No pedal edema, cyanosis, or clubbing.  Dressing on left foot. NEUROLOGIC: Cranial nerves II through XII are intact. Muscle strength 5/5 in all extremities. Sensation intact. Gait not checked.  PSYCHIATRIC: The patient is alert and oriented x 3.  SKIN: No obvious rash, lesion, or ulcer.   Physical Exam LABORATORY PANEL:   CBC Recent Labs  Lab 07/21/18 0418  WBC 9.3  HGB 9.5*  HCT 29.6*  PLT 231   ------------------------------------------------------------------------------------------------------------------  Chemistries  Recent Labs  Lab 07/17/18 1816  07/21/18 0418  NA 137   < > 136  K 5.4*   < > 4.6  CL 105   < > 102  CO2 25   < > 26  GLUCOSE 112*   < > 100*  BUN 24*   < > 21  CREATININE 1.41*   < > 1.19  CALCIUM 9.5   < > 9.0  AST 27  --   --   ALT 72*  --   --   ALKPHOS 83  --   --   BILITOT 0.4  --   --    < > = values in this interval not displayed.   ------------------------------------------------------------------------------------------------------------------  Cardiac Enzymes No results for input(s): TROPONINI in the last 168 hours. ------------------------------------------------------------------------------------------------------------------  RADIOLOGY:  [place] Ekg Site Rite  Result Date: 07/22/2018 If Site Rite image not attached, placement could not be confirmed due to current cardiac rhythm.   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 Amputation sample sent for Cx.  As per podiatry, need to get cx result and ID consult to decide Out pt ABx.  *Diabetes with complications We will hold oral medication and keep on sliding  scale coverage Blood sugar is under control.  * Htn   Cont home meds-fairly controlled.  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 He had bilateral angioplasty done in July 2019  * s/p amputation Continue to monitor, may need wound care and physical therapy and follow-up with podiatry clinic.  May need post surgical boot and PT eval. Partial weight bearing. PT suggested rehab.  All the records are reviewed and case discussed with Care Management/Social Workerr. Management plans discussed with the patient, family and they are in agreement.  CODE STATUS: Full code.  TOTAL TIME TAKING CARE OF THIS PATIENT: 35 minutes.   Patient's wife present in the room.  POSSIBLE D/C IN 2-3 DAYS, DEPENDING ON CLINICAL CONDITION.   Altamese Dilling M.D on 07/22/2018   Between 7am to 6pm - Pager - [phone]  After 6pm go to [email or web address] - Social research officer, government  [facility] [place] [facility]  Office  [phone]  CC: Primary care physician; Whitaker, Jonnie Finner, PA-C  Note: This dictation was prepared with Dragon dictation along with smaller phrase technology. Any transcriptional errors that result from this process are unintentional.

## 2018-07-22 NOTE — Clinical Social Work Note (Signed)
Clinical Social Work Assessment  Patient Details  Name: [name] MRN: [id number] Date of Birth: [birth year]  Date of referral:  07/22/18               Reason for consult:  Facility Placement                Permission sought to share information with:  Chartered certified accountant granted to share information::  Yes, Verbal Permission Granted  Name::      Charles::   [name]   Relationship::     Contact Information:     Housing/Transportation Living arrangements for the past 2 months:  [place] of Information:  Patient, Spouse Patient Interpreter Needed:  None Criminal Activity/Legal Involvement Pertinent to Current Situation/Hospitalization:  No - Comment as needed Significant Relationships:  Spouse Lives with:  Spouse Do you feel safe going back to the place where you live?  Yes Need for family participation in patient care:  Yes (Comment)  Care giving concerns:  Patient lives in [place] with his wife [name]    Social Worker assessment / plan:  Holiday representative (CSW) reviewed chart and noted that PT is recommending SNF. CSW met with patient and his wife [name] was at bedside. Patient was alert and oriented X4 and was laying in the bed. CSW introduced self and explained role of CSW department. Per patient he lives with his wife [name] CSW explained that PT is recommending SNF and per MD patient will need IV ABX at discharge. CSW also explained that [facility] will have to approve SNF. Patient and wife verbalized their understanding and are agreeable to SNF search in [place]. FL2 complete and faxed out. CSW presented bed offers to patient and wife, they chose [facility]. Per Otila Kluver [facility] liaison she will start [facility] SNF authorization today. CSW will continue to follow and assist as needed.   Employment status:  Retired Nurse, adult PT Recommendations:  [place]  / Referral to community resources:  [place]  Patient/Family's Response to care:  Patient chose [facility].   Patient/Family's Understanding of and Emotional Response to Diagnosis, Current Treatment, and Prognosis:  Patient and his wife were very pleasant and thanked CSW for assistance.   Emotional Assessment Appearance:  Appears stated age Attitude/Demeanor/Rapport:    Affect (typically observed):  Accepting, Adaptable, Pleasant Orientation:  Oriented to Self, Oriented to Place, Oriented to  Time, Oriented to Situation Alcohol / Substance use:  Not Applicable Psych involvement (Current and /or in the community):  No (Comment)  Discharge Needs  Concerns to be addressed:  Discharge Planning Concerns Readmission within the last 30 days:  No Current discharge risk:  Dependent with Mobility Barriers to Discharge:  Continued Medical Work up   [employer], Veronia Beets, LCSW 07/22/2018, 3:37 PM

## 2018-07-23 DIAGNOSIS — I739 Peripheral vascular disease, unspecified: Secondary | ICD-10-CM | POA: Diagnosis not present

## 2018-07-23 DIAGNOSIS — S98912A Complete traumatic amputation of left foot, level unspecified, initial encounter: Secondary | ICD-10-CM | POA: Diagnosis not present

## 2018-07-23 DIAGNOSIS — Z7401 Bed confinement status: Secondary | ICD-10-CM | POA: Diagnosis not present

## 2018-07-23 DIAGNOSIS — R05 Cough: Secondary | ICD-10-CM | POA: Diagnosis not present

## 2018-07-23 DIAGNOSIS — M86172 Other acute osteomyelitis, left ankle and foot: Secondary | ICD-10-CM | POA: Diagnosis not present

## 2018-07-23 DIAGNOSIS — M86672 Other chronic osteomyelitis, left ankle and foot: Secondary | ICD-10-CM | POA: Diagnosis not present

## 2018-07-23 DIAGNOSIS — M869 Osteomyelitis, unspecified: Secondary | ICD-10-CM | POA: Diagnosis not present

## 2018-07-23 DIAGNOSIS — M6281 Muscle weakness (generalized): Secondary | ICD-10-CM | POA: Diagnosis not present

## 2018-07-23 DIAGNOSIS — K5909 Other constipation: Secondary | ICD-10-CM | POA: Diagnosis not present

## 2018-07-23 DIAGNOSIS — I10 Essential (primary) hypertension: Secondary | ICD-10-CM | POA: Diagnosis not present

## 2018-07-23 DIAGNOSIS — I96 Gangrene, not elsewhere classified: Secondary | ICD-10-CM | POA: Diagnosis not present

## 2018-07-23 DIAGNOSIS — E119 Type 2 diabetes mellitus without complications: Secondary | ICD-10-CM | POA: Diagnosis not present

## 2018-07-23 LAB — CREATININE, SERUM
CREATININE: 1.08 mg/dL (ref 0.61–1.24)
GFR calc non Af Amer: >60 mL/min (ref >60)

## 2018-07-23 LAB — SURGICAL PATHOLOGY

## 2018-07-23 LAB — GLUCOSE, CAPILLARY
GLUCOSE-CAPILLARY: 93 mg/dL (ref 70–99)
GLUCOSE-CAPILLARY: 190 mg/dL — AB (ref 70–99)

## 2018-07-23 MED ORDER — OXYCODONE HCL 5 MG PO TABS: 5.0000 mg | ORAL_TABLET | Freq: Four times a day (QID) | ORAL | 0 refills | Status: DC | PRN

## 2018-07-23 MED ORDER — DOCUSATE SODIUM 100 MG PO CAPS: 100.0000 mg | ORAL_CAPSULE | Freq: Two times a day (BID) | ORAL | 0 refills | Status: DC

## 2018-07-23 MED ORDER — VANCOMYCIN IV (FOR PTA / DISCHARGE USE ONLY): 1500.0000 mg | INTRAVENOUS | 0 refills | Status: AC

## 2018-07-23 NOTE — Clinical Social Work Placement (Signed)
   CLINICAL SOCIAL WORK PLACEMENT  NOTE  Date:  07/23/2018  Patient Details  Name: [name] MRN: [id number] Date of Birth: [birth year]  Clinical Social Work is seeking post-discharge placement for this patient at the Skilled  Nursing Facility level of care (*CSW will initial, date and re-position this form in  chart as items are completed):  Yes   Patient/family provided with [place] Clinical Social Work Department's list of facilities offering this level of care within the geographic area requested by the patient (or if unable, by the patient's family).  Yes   Patient/family informed of their freedom to choose among providers that offer the needed level of care, that participate in Medicare, Medicaid or managed care program needed by the patient, have an available bed and are willing to accept the patient.  Yes   Patient/family informed of [facility]'s ownership interest in [facility] and [facility], as well as of the fact that they are under no obligation to receive care at these facilities.  PASRR submitted to EDS on 07/22/18     PASRR number received on 07/22/18     Existing PASRR number confirmed on       FL2 transmitted to all facilities in geographic area requested by pt/family on 07/22/18     FL2 transmitted to all facilities within larger geographic area on       Patient informed that his/her managed care company has contracts with or will negotiate with certain facilities, including the following:        Yes   Patient/family informed of bed offers received.  Patient chooses bed at ([facility] )     Physician recommends and patient chooses bed at      Patient to be transferred to ([facility]) on 07/23/18.  Patient to be transferred to facility by [facility] )     Patient family notified on 07/23/18 of transfer.  Name of family member notified:  (Patient's wife Bonita Quin is at bedside and aware of D/C today. )     PHYSICIAN       Additional  Comment:    _______________________________________________ Quayshawn Nin, Darleen Crocker, LCSW 07/23/2018, 1:41 PM

## 2018-07-23 NOTE — Discharge Summary (Signed)
[place] [facility] [place] NAME: [name]    MR#:  [id number]  DATE OF BIRTH:  [birth year]  DATE OF ADMISSION:  07/17/2018 ADMITTING PHYSICIAN: Vaughan Basta, MD  DATE OF DISCHARGE: 07/23/2018   PRIMARY CARE PHYSICIAN: Donnamarie Rossetti, PA-C    ADMISSION DIAGNOSIS:  gangreene lt great toe  DISCHARGE DIAGNOSIS:  Principal Problem:   Osteomyelitis ([place])   SECONDARY DIAGNOSIS:   Past Medical History:  Diagnosis Date  . 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 Amputation sample sent for Cx.  As per podiatry, need to get cx result and ID consult to decide Out pt ABx. need IV antibiotic, PICC line placed, Vanc as per ID and podiatry..  *Diabetes with complications We will hold oral medication and keep on sliding scale coverage Blood sugar is under control.  * Htn Cont home meds-fairly controlled.  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 He had bilateral angioplasty done in July 2019  * s/p amputation Continue to monitor, may need wound care and physical therapy and follow-up with podiatry clinic.   DISCHARGE CONDITIONS:    Stable. CONSULTS OBTAINED:  Treatment Team:  Albertine Patricia, Maia Plan, DPM Tsosie Billing, MD  DRUG ALLERGIES:   Allergies  Allergen Reactions  . Flu Virus Vaccine Other (See Comments) and Nausea And Vomiting    Fever, chills, vomiting.     DISCHARGE MEDICATIONS:   Allergies as of 07/23/2018      Reactions   Flu Virus Vaccine Other (See Comments), Nausea And Vomiting   Fever,  chills, vomiting.       Medication List    STOP taking these medications   metFORMIN 500 MG tablet Commonly known as:  GLUCOPHAGE   sulfamethoxazole-trimethoprim 800-160 MG tablet Commonly known as:  BACTRIM DS,SEPTRA DS     TAKE these medications   amLODipine 2.5 MG tablet Commonly known as:  NORVASC Take 2.5 mg by mouth daily.   clopidogrel 75 MG tablet Commonly known as:  PLAVIX Take 1 tablet (75 mg total) by mouth daily.   docusate sodium 100 MG capsule Commonly known as:  COLACE Take 1 capsule (100 mg total) by mouth 2 (two) times daily.   lisinopril 5 MG tablet Commonly known as:  PRINIVIL,ZESTRIL Take 5 mg by mouth daily.   oxyCODONE 5 MG immediate release tablet Commonly known as:  Oxy IR/ROXICODONE Take 1 tablet (5 mg total) by mouth every 6 (six) hours as needed for moderate pain or severe pain.   silver sulfADIAZINE 1 % cream Commonly known as:  SILVADENE APPLY TOPICALLY ONCE DAILY APPLY 3 TIMES A WEEK   vancomycin  IVPB Inject 1,500 mg into the vein daily for 22 days. Indication:  MRSA Osteomyelitis  Last Day of Therapy:  08/14/18 Labs - Monday:  CBC/D, CMP, ESR, and vancomycin trough. Labs - Thursday:  BMP and vancomycin trough Start taking on:  07/24/2018            Home Infusion Instuctions  (From admission, onward)         Start     Ordered   07/23/18 0000  Home infusion instructions [place] May follow  [facility] Dosing Protocol; May administer Cathflo as needed to maintain patency of vascular access device.; [place] of vascular access device: per [facility] Protocol: 0.9% NaCl pre/post medica...    Question Answer Comment  Instructions May follow [place] Dosing Protocol   Instructions May administer Cathflo as needed to maintain patency of vascular access device.   Instructions [place] of vascular access device: per [facility] Protocol: 0.9% NaCl pre/post medication administration and prn patency; Heparin 100 u/ml, 30m for implanted  ports and Heparin 10u/ml, 560mfor all other central venous catheters.   Instructions May follow [facility] Anaphylaxis Protocol for First Dose Administration in the home: 0.9% NaCl at 25-50 ml/hr to maintain IV access for protocol meds. Epinephrine 0.3 ml IV/IM PRN and Benadryl 25-50 IV/IM PRN s/s of anaphylaxis.   Instructions [facility] Infusion Coordinator (RN) to assist per patient IV care needs in the home PRN.      07/23/18 1128           DISCHARGE INSTRUCTIONS:    OPAT Orders Discharge antibiotics: Vancomycin '1500mg'$  IV every 24 hrs ( last dose given at 4.30 am on 07/23/18 Per pharmacy protocol Aim for Vancomycin trough 15-20  Duration: 4 weeks End Date:08/14/18   [facility]  Labs Monday _X_ CBC with differential  X_ CMP X__ ESR _X_ Vancomycin trough  Labs Thursday BMP Vancomycin trough  Please pull PIC at completion of IV antibiotics   Fax weekly labs to ([phone] Follow Up Appt: in 2 weeks [phone] has an OrthoWedge shoe he must use and also a walker and he should do it with assistance.  If you experience worsening of your admission symptoms, develop shortness of breath, life threatening emergency, suicidal or homicidal thoughts you must seek medical attention immediately by calling 911 or calling your MD immediately  if symptoms less severe.  You Must read complete instructions/literature along with all the possible adverse reactions/side effects for all the Medicines you take and that have been prescribed to you. Take any new Medicines after you have completely understood and accept all the possible adverse reactions/side effects.   Please note  You were cared for by a hospitalist during your hospital stay. If you have any questions about your discharge medications or the care you received while you were in the hospital after you are discharged, you can call the unit and asked to speak with the hospitalist on call if  the hospitalist that took care of you is not available. Once you are discharged, your primary care physician will handle any further medical issues. Please note that NO REFILLS for any discharge medications will be authorized once you are discharged, as it is imperative that you return to your primary care physician (or establish a relationship with a primary care physician if you do not have one) for your aftercare needs so that they can reassess your need for medications and monitor your lab values.    Today   CHIEF COMPLAINT:  No chief complaint on file.   HISTORY OF PRESENT ILLNESS:  HoRashon Westrupis a [age].o. male with a known history of diabetes mellitus and osteomyelitis of left foot-had ray resection in July 2019 and was on oral Bactrim since then.  Has also home visiting nurse for wound care 2 times a week. The wound care nurse noted some new possible collections on his feet but patient did not had any fever or chills.  He had a regular visit to Dr.  Caryl Comes today. Dr. Caryl Comes had ordered x-ray on his feet to check for osteomyelitis and which reported to be positive, so he called in for direct technician for plan of transmetatarsal amputation likely tomorrow.   VITAL SIGNS:  Blood pressure 128/63, pulse 64, temperature 98.3 F (36.8 C), temperature source Oral, resp. rate 18, height '5\' 8"'$  (1.727 m), weight 86.2 kg, SpO2 96 %.  I/O:    Intake/Output Summary (Last 24 hours) at 07/23/2018 1132 Last data filed at 07/23/2018 0934 Gross per 24 hour  Intake 480 ml  Output 1725 ml  Net -1245 ml    PHYSICAL EXAMINATION:   GENERAL:  77 y.o.-year-old patient lying in the bed with no acute distress.  EYES: Pupils equal, round, reactive to light and accommodation. No scleral icterus. Extraocular muscles intact.  HEENT: Head atraumatic, normocephalic. Oropharynx and nasopharynx clear.  NECK:  Supple, no jugular venous distention. No thyroid enlargement, no tenderness.  LUNGS: Normal breath  sounds bilaterally, no wheezing, rales,rhonchi or crepitation. No use of accessory muscles of respiration.  CARDIOVASCULAR: S1, S2 normal. No murmurs, rubs, or gallops.  ABDOMEN: Soft, nontender, nondistended. Bowel sounds present. No organomegaly or mass.  EXTREMITIES: No pedal edema, cyanosis, or clubbing.  Dressing on left foot. NEUROLOGIC: Cranial nerves II through XII are intact. Muscle strength 5/5 in all extremities. Sensation intact. Gait not checked.  PSYCHIATRIC: The patient is alert and oriented x 3.  SKIN: No obvious rash, lesion, or ulcer.   DATA REVIEW:   CBC Recent Labs  Lab 07/21/18 0418  WBC 9.3  HGB 9.5*  HCT 29.6*  PLT 231    Chemistries  Recent Labs  Lab 07/17/18 1816  07/21/18 0418 07/23/18 0415  NA 137   < > 136  --   K 5.4*   < > 4.6  --   CL 105   < > 102  --   CO2 25   < > 26  --   GLUCOSE 112*   < > 100*  --   BUN 24*   < > 21  --   CREATININE 1.41*   < > 1.19 1.08  CALCIUM 9.5   < > 9.0  --   AST 27  --   --   --   ALT 72*  --   --   --   ALKPHOS 83  --   --   --   BILITOT 0.4  --   --   --    < > = values in this interval not displayed.    Cardiac Enzymes No results for input(s): TROPONINI in the last 168 hours.  Microbiology Results  Results for orders placed or performed during the hospital encounter of 07/17/18  Surgical PCR screen     Status: Abnormal   Collection Time: 07/17/18  7:34 PM  Result Value Ref Range Status   MRSA, PCR POSITIVE (A) NEGATIVE Final    Comment: RESULT CALLED TO, READ BACK BY AND VERIFIED WITH: TAMICCO COLES 07/17/18 AT 2125 BY HS    Staphylococcus aureus POSITIVE (A) NEGATIVE Final    Comment: (NOTE) The Xpert SA Assay (FDA approved for NASAL specimens in patients 72 years of age and older), is one component of a comprehensive surveillance program. It is not intended to diagnose infection nor to guide or monitor treatment. Performed at [facility], [address] [place], [place]  [address]   Aerobic/Anaerobic Culture (surgical/deep wound)     Status: None (Preliminary result)  Collection Time: 07/19/18  8:50 AM  Result Value Ref Range Status   Specimen Description   Final    BONE LEFT FOOT Performed at [facility], [place] [address], [place], [place] [address]    Special Requests   Final    NONE Performed at [facility], [place], [place] [address]    Gram Stain   Final    ABUNDANT WBC PRESENT, PREDOMINANTLY PMN RARE GRAM NEGATIVE RODS RARE GRAM POSITIVE COCCI Performed at [facility], [place] [address] [place], [place] [address]    Culture   Final    FEW METHICILLIN RESISTANT STAPHYLOCOCCUS AUREUS NO ANAEROBES ISOLATED; CULTURE IN PROGRESS FOR 5 DAYS    Report Status PENDING  Incomplete   Organism ID, Bacteria METHICILLIN RESISTANT STAPHYLOCOCCUS AUREUS  Final      Susceptibility   Methicillin resistant staphylococcus aureus - MIC*    CIPROFLOXACIN >=8 RESISTANT Resistant     ERYTHROMYCIN >=8 RESISTANT Resistant     GENTAMICIN <=0.5 SENSITIVE Sensitive     OXACILLIN >=4 RESISTANT Resistant     TETRACYCLINE <=1 SENSITIVE Sensitive     VANCOMYCIN 1 SENSITIVE Sensitive     TRIMETH/SULFA >=320 RESISTANT Resistant     CLINDAMYCIN <=0.25 SENSITIVE Sensitive     RIFAMPIN <=0.5 SENSITIVE Sensitive     Inducible Clindamycin NEGATIVE Sensitive     * FEW METHICILLIN RESISTANT STAPHYLOCOCCUS AUREUS    RADIOLOGY:  [place] Ekg Site Rite  Result Date: 07/22/2018 If Site Rite image not attached, placement could not be confirmed due to current cardiac rhythm.   EKG:  No orders found for this or any previous visit.    Management plans discussed with the patient, family and they are in agreement.  CODE STATUS:     Code Status Orders  (From admission, onward)         Start     Ordered   07/17/18 1808  Full code  Continuous     07/17/18 1808        Code Status History    Date Active Date Inactive Code Status Order ID  Comments User Context   03/26/2018 1835 03/30/2018 2009 Full Code [id number]  Schnier, Dolores Lory, MD Inpatient      TOTAL TIME TAKING CARE OF THIS PATIENT: 35 minutes.    Vaughan Basta M.D on 07/23/2018 at 11:32 AM  Between 7am to 6pm - Pager - [phone]  After 6pm go to [email or web address] - password [email or web address] [facility]  Office  [phone]  CC: Primary care physician; Whitaker, Rolanda Jay, PA-C   Note: This dictation was prepared with Dragon dictation along with smaller phrase technology. Any transcriptional errors that result from this process are unintentional.

## 2018-07-23 NOTE — Progress Notes (Signed)
Physical Therapy Treatment Patient Details Name: [name] MRN: [id number] DOB: 08-May-1941 Today's Date: 07/23/2018    History of Present Illness  Pt is a 77 y.o. male with a known history of diabetes mellitus and osteomyelitis of left foot who had ray resection in July 2019 and was on oral Bactrim since then.  Has also home visiting nurse for wound care 2 times a week.  The wound care nurse noted some new possible collections on his feet but patient did not had any fever or chills.  X-ray showed some bone changes at the base of the great toe and the metatarsal head region and pt diagnosed with osteomyelitis first metatarsal and great toe left foot.  Pt is now s/p transmetatarsal amputation left foot.  Assessment also includes DM, HTN, and PVD.     PT Comments    Patient agreeable to PT with encouragement.Able to perform therapeutic exercises with minimal verbal cues from PT. Sit <> stand transfer from elevated surface and recliner with CGA-supervision and RW. CGA from low commode surface with heavy use of grab bars. Patient ambulated ~33ft total during session with RW and CGA, compliant with PWB status on LLE. When asked, pt reported that he was putting 5-15lbs on his LLE. Educated about importance of minimizing as much as possible. Pt up in chair at end of session with all needs in reach. The patient would benefit from STR to maximize mobility, independence, safety, as well as further education about PWB status. If patient is to return home, pt will need supervision for OOB/mobility and listed DME below.     Follow Up Recommendations  SNF;Supervision for mobility/OOB     Equipment Recommendations  Rolling walker with 5" wheels;3in1 (PT)    Recommendations for Other Services       Precautions / Restrictions Precautions Precautions: Fall Required Braces or Orthoses: Other Brace/Splint Other Brace/Splint: OrthoWedge shoe to the L foot with weight bearing  Restrictions Weight  Bearing Restrictions: Yes LLE Weight Bearing: Partial weight bearing LLE Partial Weight Bearing Percentage or Pounds: Per MD, pt is PWB but is to use a RW to take as much weight off of the L foot as possible  Other Position/Activity Restrictions: Amb only for toileting and transfers to recliner with the use of a RW    Mobility  Bed Mobility Overal bed mobility: Independent                Transfers Overall transfer level: Needs assistance Equipment used: Rolling walker (2 wheeled) Transfers: Sit to/from Stand Sit to Stand: Min guard;Supervision         General transfer comment: Performed low commode transfers with CGA and heavy use of grab bars. from bed with supervision and RW.  Ambulation/Gait Ambulation/Gait assistance: Min guard Gait Distance (Feet): 20 Feet Assistive device: Rolling walker (2 wheeled) Gait Pattern/deviations: Step-to pattern Gait velocity: Decreased   General Gait Details: min cues to maintain PWB on LLE   Stairs             Wheelchair Mobility    Modified Rankin (Stroke Patients Only)       Balance Overall balance assessment: No apparent balance deficits (not formally assessed)                                          Cognition Arousal/Alertness: Awake/alert Behavior During Therapy: WFL for tasks assessed/performed Overall Cognitive  Status: Within Functional Limits for tasks assessed                                        Exercises Total Joint Exercises Quad Sets: Strengthening;Both;10 reps Short Arc Quad: Strengthening;Both;10 reps Hip ABduction/ADduction: AROM;Strengthening;Both;15 reps Straight Leg Raises: Both;Strengthening;10 reps Long Arc Quad: Both;10 reps;Strengthening Other Exercises Other Exercises: Pt used commode ~40minutes during session.     General Comments        Pertinent Vitals/Pain Pain Assessment: No/denies pain    Home Living                      Prior  Function            PT Goals (current goals can now be found in the care plan section) Progress towards PT goals: Progressing toward goals    Frequency    7X/week      PT Plan Current plan remains appropriate    Co-evaluation              AM-PAC PT "6 Clicks" Daily Activity  Outcome Measure  Difficulty turning over in bed (including adjusting bedclothes, sheets and blankets)?: None Difficulty moving from lying on back to sitting on the side of the bed? : None Difficulty sitting down on and standing up from a chair with arms (e.g., wheelchair, bedside commode, etc,.)?: A Little   Help needed walking in hospital room?: A Little Help needed climbing 3-5 steps with a railing? : A Lot 6 Click Score: 16    End of Session Equipment Utilized During Treatment: Gait belt Activity Tolerance: Patient tolerated treatment well Patient left: in chair;with chair alarm set;with family/visitor present;Other (comment)(heels elevated) Nurse Communication: Mobility status PT Visit Diagnosis: Difficulty in walking, not elsewhere classified (R26.2);Muscle weakness (generalized) (M62.81)     Time: 1025-1101 PT Time Calculation (min) (ACUTE ONLY): 36 min  Charges:  $Therapeutic Exercise: 8-22 mins $Therapeutic Activity: 8-22 mins                     Olga Coaster PT, DPT 12:38 PM,07/23/18 [phone]

## 2018-07-23 NOTE — Treatment Plan (Signed)
Diagnosis: MRSA osteomyelitis Baseline Creatinine 1.09  Culture Result: MRSA  Allergies  Allergen Reactions  . Flu Virus Vaccine Other (See Comments) and Nausea And Vomiting    Fever, chills, vomiting.     OPAT Orders Discharge antibiotics: Vancomycin 1570m IV every 24 hrs ( last dose given at 4.30 am on 07/23/18 Per pharmacy protocol Aim for Vancomycin trough 15-20  Duration: 4 weeks End Date:08/14/18   [facility]  Labs Monday _X_ CBC with differential  X_ CMP X__ ESR _X_ Vancomycin trough  Labs Thursday BMP Vancomycin trough  Please pull PIC at completion of IV antibiotics   Fax weekly labs to [phone] Follow Up Appt: in 2 weeks [phone]

## 2018-07-23 NOTE — Progress Notes (Signed)
Patient is medically stable for D/C to [facility] today. Per Inetta Fermo [facility] liaison [facility] SN[facility] authorization has been received and patient can come today to room 601. RN will call report and arrange EMS for transport. Clinical Child psychotherapist (CSW) sent D/C orders to [facility] via [facility]. Patient and his wife Bonita Quin are aware of above. Please reconsult if future social work needs arise. CSW signing off.   [employer], LCSW [phone]

## 2018-07-23 NOTE — Progress Notes (Signed)
Report given to [place] at [facility]. Pt comfortable, sitting up in a chair next to his wife. IV out and PICC is in and stable.

## 2018-07-23 NOTE — Progress Notes (Signed)
PHARMACY CONSULT NOTE FOR:  OUTPATIENT  PARENTERAL ANTIBIOTIC THERAPY (OPAT)  Indication: MRSA Osteomyelitis   Regimen: Vancomycin IV 1500 mg q24H   End date: 08/14/18  IV antibiotic discharge orders are pended. To discharging provider:  please sign these orders via discharge navigator,  Select New Orders & click on the button choice - Manage This Unsigned Work.     Thank you for allowing pharmacy to be a part of this patient's care.  Ronnald Ramp, PharmD Clinical Pharmacist  07/23/2018, 10:05 AM

## 2018-07-23 NOTE — Consult Note (Signed)
Pharmacy Antibiotic Note  [name] is a 77 y.o. male admitted on 07/17/2018 with Osteomyelitis.  Pharmacy has been consulted for Vancomycin dosing.    Plan: Ke: 0.043   T1/2: 16.12   VD: 60.2  Continue Vancomycin 1500 IV every 24 hours. Goal trough 15-20 mcg/mL. Calculated trough at Css is 17. Trough on 11/03 was 17 (within goal). Level was ~23 hours post dose. Will continue current dose. No major change in renal function. Will order a trough level for tomorrow.   Height: 5\' 8"  (172.7 cm) Weight: 190 lb (86.2 kg) IBW/kg (Calculated) : 68.4  Temp (24hrs), Avg:98.4 F (36.9 C), Min:98.2 F (36.8 C), Max:98.8 F (37.1 C)  Recent Labs  Lab 07/17/18 1816 07/18/18 0502 07/20/18 0339 07/21/18 0418 07/23/18 0415  WBC 8.2 7.5 9.2 9.3  --   CREATININE 1.41* 1.45* 1.20 1.19 1.08  VANCOTROUGH  --   --  17  --   --     Estimated Creatinine Clearance: 61.2 mL/min (by C-G formula based on SCr of 1.08 mg/dL).    Allergies  Allergen Reactions  . Flu Virus Vaccine Other (See Comments) and Nausea And Vomiting    Fever, chills, vomiting.     Antimicrobials this admission: 10/31 vancomycin  >>  10/31 Cefepime >> 11/5 10/31 Metronidazole>>11/5  Dose adjustments this admission: 11/03 0330 vanc level 17. Continue current regimen.  Microbiology results: 10/31 MRSA PCR: Positive  10/31 Wound cx: MRSA sensitive to Vancomycin  11/2 Bone tissue cx: MRSA and Rare GNR - still processing.   Thank you for allowing pharmacy to be a part of this patient's care.  Ronnald Ramp, PharmD Clinical Pharmacist 07/23/2018 7:49 AM

## 2018-07-23 NOTE — Progress Notes (Signed)
[facility]                                                      Patient Demographics  [name], is a 77 y.o. male   MRN: [id number]   DOB - 02-21-1941  Admit Date - 07/17/2018    Outpatient Primary MD for the patient is Venetia Maxon, Rolanda Jay, PA-C  Consult requested in the Hospital by Vaughan Basta, *, On 07/23/2018    With History of -  Past Medical History:  Diagnosis Date  . Diabetes mellitus without complication [facility])       Past Surgical History:  Procedure Laterality Date  . AMPUTATION TOE Left 03/28/2018   Procedure: AMPUTATION TOE/ PARTIAL RAY RESECTION-LEFT 2ND AND 3RD;  Surgeon: Sharlotte Alamo, DPM;  Location: [facility] ORS;  Service: Podiatry;  Laterality: Left;  . HERNIA REPAIR    . LOWER EXTREMITY ANGIOGRAPHY Left 03/26/2018   Procedure: Lower Extremity Angiography;  Surgeon: Katha Cabal, MD;  Location: [place] [facility];  Service: Cardiovascular;  Laterality: Left;  . 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   Procedure: TRANSMETATARSAL AMPUTATION;  Surgeon: Albertine Patricia, DPM;  Location: [facility] ORS;  Service: Podiatry;  Laterality: Left;    in for   No chief complaint on file.    HPI  [name]  is a 77 y.o. male, patient is 4 days status post transmetatarsal amputation left foot.  Doing well no real pain or discomfort seems to be progressing nicely overall.    Review of Systems is alert well oriented afebrile pleasant.  In addition to the HPI above,  No Fever-chills, No Headache, No changes with Vision or hearing, No problems swallowing food or Liquids, No Chest pain, Cough or Shortness of Breath, No Abdominal pain, No Nausea or Vommitting, Bowel movements are regular, No Blood in stool or Urine, No dysuria, No new skin rashes or bruises, No new joints pains-aches,  No new weakness, tingling,  numbness in any extremity, No recent weight gain or loss, No polyuria, polydypsia or polyphagia, No significant Mental Stressors.  A full 10 point Review of Systems was done, except as stated above, all other Review of Systems were negative.   Social History Social History   Tobacco Use  . Smoking status: Former Smoker    Types: Cigarettes  . Smokeless tobacco: Never Used  Substance Use Topics  . Alcohol use: Never    Frequency: Never    Family History History reviewed. No pertinent family history.  Prior to Admission medications   Medication Sig Start Date End Date Taking? Authorizing Provider  amLODipine (NORVASC) 2.5 MG tablet Take 2.5 mg by mouth daily. 03/14/18   [provider]  clopidogrel (PLAVIX) 75 MG tablet Take 1 tablet (75 mg total) by mouth daily. 03/30/18 03/30/19  Gladstone Lighter, MD  docusate sodium (COLACE) 100 MG capsule Take 1 capsule (100 mg total) by mouth 2 (two) times daily. 07/23/18   Vaughan Basta, MD  lisinopril (PRINIVIL,ZESTRIL) 5 MG tablet Take 5 mg by mouth daily. 02/25/18   [provider]  metFORMIN (GLUCOPHAGE) 500 MG tablet Take 1,000 mg by mouth 2 (two) times daily. 02/25/18   [provider]  oxyCODONE (OXY IR/ROXICODONE) 5 MG immediate release tablet Take 1 tablet (5 mg total) by mouth every 6 (six) hours as needed for moderate pain  or severe pain. 07/23/18   Vaughan Basta, MD  silver sulfADIAZINE (SILVADENE) 1 % cream APPLY TOPICALLY ONCE DAILY APPLY 3 TIMES A WEEK 04/28/18   [provider]  sulfamethoxazole-trimethoprim (BACTRIM DS,SEPTRA DS) 800-160 MG tablet Take by mouth. 04/17/18   [provider]  vancomycin IVPB Inject 1,500 mg into the vein daily for 22 days. Indication:  MRSA Osteomyelitis  Last Day of Therapy:  08/14/18 Labs - Monday:  CBC/D, CMP, ESR, and vancomycin trough. Labs - Thursday:  BMP and vancomycin trough 07/24/18 08/15/18  Vaughan Basta, MD     Anti-infectives (From admission, onward)   Start     Dose/Rate Route Frequency Ordered Stop   07/24/18 0000  vancomycin IVPB     1,500 mg Intravenous Every 24 hours 07/23/18 1128 08/15/18 2359   07/18/18 0800  ceFEPIme (MAXIPIME) 2 g in sodium chloride 0.9 % 100 mL IVPB  Status:  Discontinued     2 g 200 mL/hr over 30 Minutes Intravenous Every 12 hours 07/17/18 2136 07/22/18 1812   07/18/18 0400  vancomycin (VANCOCIN) 1,500 mg in sodium chloride 0.9 % 500 mL IVPB     1,500 mg 250 mL/hr over 120 Minutes Intravenous Every 24 hours 07/17/18 2135     07/17/18 2000  metroNIDAZOLE (FLAGYL) IVPB 500 mg  Status:  Discontinued     500 mg 100 mL/hr over 60 Minutes Intravenous Every 8 hours 07/17/18 1819 07/22/18 1812   07/17/18 1900  ceFEPIme (MAXIPIME) 2 g in sodium chloride 0.9 % 100 mL IVPB     2 g 200 mL/hr over 30 Minutes Intravenous  Once 07/17/18 1825 07/17/18 2057   07/17/18 1900  vancomycin (VANCOCIN) IVPB 1000 mg/200 mL premix     1,000 mg 200 mL/hr over 60 Minutes Intravenous  Once 07/17/18 1825 07/17/18 2232      Scheduled Meds: . amLODipine  2.5 mg Oral Daily  . heparin  5,000 Units Subcutaneous Q8H  . insulin aspart  0-9 Units Subcutaneous TID WC  . lisinopril  5 mg Oral Daily  . sodium chloride flush  10-40 mL Intracatheter Q12H   Continuous Infusions: . vancomycin 1,500 mg (07/23/18 0429)   PRN Meds:.acetaminophen, docusate sodium, menthol-cetylpyridinium, oxyCODONE, sodium chloride flush  Allergies  Allergen Reactions  . Flu Virus Vaccine Other (See Comments) and Nausea And Vomiting    Fever, chills, vomiting.     Physical Exam  Vitals  Blood pressure 128/63, pulse 64, temperature 98.3 F (36.8 C), temperature source Oral, resp. rate 18, height '5\' 8"'$  (1.727 m), weight 86.2 kg, SpO2 96 %.  Lower Extremity exam: Change bandage days incision is very stable appears to be healing nicely there is no dehiscence no drainage appears dry and well coapted.  Data  Review  CBC Recent Labs  Lab 07/17/18 1816 07/18/18 0502 07/20/18 0339 07/21/18 0418  WBC 8.2 7.5 9.2 9.3  HGB 9.5* 8.7* 9.6* 9.5*  HCT 29.7* 27.5* 30.0* 29.6*  PLT 248 237 238 231  MCV 94.0 94.8 92.0 93.7  MCH 30.1 30.0 29.4 30.1  MCHC 32.0 31.6 32.0 32.1  RDW 13.4 13.6 13.4 13.2   ------------------------------------------------------------------------------------------------------------------  Chemistries  Recent Labs  Lab 07/17/18 1816 07/18/18 0502 07/20/18 0339 07/21/18 0418 07/23/18 0415  NA 137 138 136 136  --   K 5.4* 4.5 4.7 4.6  --   CL 105 105 103 102  --   CO2 '25 26 27 26  '$ --   GLUCOSE 112* 89 138* 100*  --  BUN 24* 25* 23 21  --   CREATININE 1.41* 1.45* 1.20 1.19 1.08  CALCIUM 9.5 8.7* 8.9 9.0  --   AST 27  --   --   --   --   ALT 72*  --   --   --   --   ALKPHOS 83  --   --   --   --   BILITOT 0.4  --   --   --   --    ------------------------------------------------------------------------------- Assessment & Plan: Overall think is doing very well and stable from my point to be transferred.  I think physical therapy recommended to go to skilled nursing facility.  I need to see him in a week or 2 to reevaluate the incision margin and make sure it healed.  Does not need dressing changes on this needs to leave this dressing this on their sterile clean dry and intact.  He needs to only have bathroom privileges with using the OrthoWedge shoe appropriately and no extra walk around at this point.  Principal Problem:   Osteomyelitis [facility])   Family Communication: Plan discussed with patient   Albertine Patricia M.D on 07/23/2018 at 1:14 PM  Thank you for the consult, we will follow the patient with you in the Hospital.

## 2018-07-24 DIAGNOSIS — M86172 Other acute osteomyelitis, left ankle and foot: Secondary | ICD-10-CM | POA: Diagnosis not present

## 2018-07-24 DIAGNOSIS — R05 Cough: Secondary | ICD-10-CM | POA: Diagnosis not present

## 2018-07-24 DIAGNOSIS — I96 Gangrene, not elsewhere classified: Secondary | ICD-10-CM | POA: Diagnosis not present

## 2018-07-24 DIAGNOSIS — I10 Essential (primary) hypertension: Secondary | ICD-10-CM | POA: Diagnosis not present

## 2018-07-24 DIAGNOSIS — E119 Type 2 diabetes mellitus without complications: Secondary | ICD-10-CM | POA: Diagnosis not present

## 2018-07-24 LAB — AEROBIC/ANAEROBIC CULTURE W GRAM STAIN (SURGICAL/DEEP WOUND)

## 2018-07-24 LAB — AEROBIC/ANAEROBIC CULTURE (SURGICAL/DEEP WOUND)

## 2018-07-30 ENCOUNTER — Other Ambulatory Visit
Admission: RE | Admit: 2018-07-30 | Discharge: 2018-07-30 | Disposition: A | Discharge status: Home / Self Care | Payer: Medicare Other | Source: Ambulatory Visit | Attending: Family Medicine | Admitting: Family Medicine

## 2018-07-30 DIAGNOSIS — R05 Cough: Secondary | ICD-10-CM | POA: Diagnosis not present

## 2018-07-30 DIAGNOSIS — I96 Gangrene, not elsewhere classified: Secondary | ICD-10-CM | POA: Diagnosis not present

## 2018-07-30 DIAGNOSIS — I739 Peripheral vascular disease, unspecified: Secondary | ICD-10-CM | POA: Diagnosis not present

## 2018-07-30 DIAGNOSIS — M86172 Other acute osteomyelitis, left ankle and foot: Secondary | ICD-10-CM | POA: Diagnosis not present

## 2018-07-30 DIAGNOSIS — E119 Type 2 diabetes mellitus without complications: Secondary | ICD-10-CM | POA: Diagnosis not present

## 2018-07-30 LAB — CREATININE, SERUM: CREATININE: 0.92 mg/dL (ref 0.61–1.24)

## 2018-07-30 LAB — VANCOMYCIN, TROUGH: Vancomycin Tr: 20 ug/mL (ref 15–20)

## 2018-07-31 DIAGNOSIS — Z4781 Encounter for orthopedic aftercare following surgical amputation: Secondary | ICD-10-CM | POA: Diagnosis not present

## 2018-07-31 DIAGNOSIS — E1169 Type 2 diabetes mellitus with other specified complication: Secondary | ICD-10-CM | POA: Diagnosis not present

## 2018-07-31 DIAGNOSIS — Z792 Long term (current) use of antibiotics: Secondary | ICD-10-CM | POA: Diagnosis not present

## 2018-07-31 DIAGNOSIS — Z7984 Long term (current) use of oral hypoglycemic drugs: Secondary | ICD-10-CM | POA: Diagnosis not present

## 2018-07-31 DIAGNOSIS — Z9181 History of falling: Secondary | ICD-10-CM | POA: Diagnosis not present

## 2018-07-31 DIAGNOSIS — I10 Essential (primary) hypertension: Secondary | ICD-10-CM | POA: Diagnosis not present

## 2018-07-31 DIAGNOSIS — L02612 Cutaneous abscess of left foot: Secondary | ICD-10-CM | POA: Diagnosis not present

## 2018-07-31 DIAGNOSIS — B9689 Other specified bacterial agents as the cause of diseases classified elsewhere: Secondary | ICD-10-CM | POA: Diagnosis not present

## 2018-07-31 DIAGNOSIS — I80203 Phlebitis and thrombophlebitis of unspecified deep vessels of lower extremities, bilateral: Secondary | ICD-10-CM | POA: Diagnosis not present

## 2018-07-31 DIAGNOSIS — Z452 Encounter for adjustment and management of vascular access device: Secondary | ICD-10-CM | POA: Diagnosis not present

## 2018-07-31 DIAGNOSIS — E1152 Type 2 diabetes mellitus with diabetic peripheral angiopathy with gangrene: Secondary | ICD-10-CM | POA: Diagnosis not present

## 2018-07-31 DIAGNOSIS — Z89422 Acquired absence of other left toe(s): Secondary | ICD-10-CM | POA: Diagnosis not present

## 2018-07-31 DIAGNOSIS — M86672 Other chronic osteomyelitis, left ankle and foot: Secondary | ICD-10-CM | POA: Diagnosis not present

## 2018-07-31 DIAGNOSIS — L03116 Cellulitis of left lower limb: Secondary | ICD-10-CM | POA: Diagnosis not present

## 2018-07-31 DIAGNOSIS — Z7902 Long term (current) use of antithrombotics/antiplatelets: Secondary | ICD-10-CM | POA: Diagnosis not present

## 2018-08-01 DIAGNOSIS — L02612 Cutaneous abscess of left foot: Secondary | ICD-10-CM | POA: Diagnosis not present

## 2018-08-01 DIAGNOSIS — M86679 Other chronic osteomyelitis, unspecified ankle and foot: Secondary | ICD-10-CM | POA: Diagnosis not present

## 2018-08-01 DIAGNOSIS — L03116 Cellulitis of left lower limb: Secondary | ICD-10-CM | POA: Diagnosis not present

## 2018-08-01 DIAGNOSIS — M86672 Other chronic osteomyelitis, left ankle and foot: Secondary | ICD-10-CM | POA: Diagnosis not present

## 2018-08-01 DIAGNOSIS — I80203 Phlebitis and thrombophlebitis of unspecified deep vessels of lower extremities, bilateral: Secondary | ICD-10-CM | POA: Diagnosis not present

## 2018-08-01 DIAGNOSIS — Z792 Long term (current) use of antibiotics: Secondary | ICD-10-CM | POA: Diagnosis not present

## 2018-08-01 DIAGNOSIS — Z452 Encounter for adjustment and management of vascular access device: Secondary | ICD-10-CM | POA: Diagnosis not present

## 2018-08-01 DIAGNOSIS — Z7984 Long term (current) use of oral hypoglycemic drugs: Secondary | ICD-10-CM | POA: Diagnosis not present

## 2018-08-01 DIAGNOSIS — Z7902 Long term (current) use of antithrombotics/antiplatelets: Secondary | ICD-10-CM | POA: Diagnosis not present

## 2018-08-01 DIAGNOSIS — Z89422 Acquired absence of other left toe(s): Secondary | ICD-10-CM | POA: Diagnosis not present

## 2018-08-01 DIAGNOSIS — Z4781 Encounter for orthopedic aftercare following surgical amputation: Secondary | ICD-10-CM | POA: Diagnosis not present

## 2018-08-01 DIAGNOSIS — Z9181 History of falling: Secondary | ICD-10-CM | POA: Diagnosis not present

## 2018-08-01 DIAGNOSIS — E1169 Type 2 diabetes mellitus with other specified complication: Secondary | ICD-10-CM | POA: Diagnosis not present

## 2018-08-01 DIAGNOSIS — E1152 Type 2 diabetes mellitus with diabetic peripheral angiopathy with gangrene: Secondary | ICD-10-CM | POA: Diagnosis not present

## 2018-08-01 DIAGNOSIS — B9689 Other specified bacterial agents as the cause of diseases classified elsewhere: Secondary | ICD-10-CM | POA: Diagnosis not present

## 2018-08-01 DIAGNOSIS — I10 Essential (primary) hypertension: Secondary | ICD-10-CM | POA: Diagnosis not present

## 2018-08-04 DIAGNOSIS — L03116 Cellulitis of left lower limb: Secondary | ICD-10-CM | POA: Diagnosis not present

## 2018-08-04 DIAGNOSIS — E1152 Type 2 diabetes mellitus with diabetic peripheral angiopathy with gangrene: Secondary | ICD-10-CM | POA: Diagnosis not present

## 2018-08-04 DIAGNOSIS — I10 Essential (primary) hypertension: Secondary | ICD-10-CM | POA: Diagnosis not present

## 2018-08-04 DIAGNOSIS — M86679 Other chronic osteomyelitis, unspecified ankle and foot: Secondary | ICD-10-CM | POA: Diagnosis not present

## 2018-08-04 DIAGNOSIS — Z89422 Acquired absence of other left toe(s): Secondary | ICD-10-CM | POA: Diagnosis not present

## 2018-08-04 DIAGNOSIS — Z792 Long term (current) use of antibiotics: Secondary | ICD-10-CM | POA: Diagnosis not present

## 2018-08-04 DIAGNOSIS — Z7984 Long term (current) use of oral hypoglycemic drugs: Secondary | ICD-10-CM | POA: Diagnosis not present

## 2018-08-04 DIAGNOSIS — E1169 Type 2 diabetes mellitus with other specified complication: Secondary | ICD-10-CM | POA: Diagnosis not present

## 2018-08-04 DIAGNOSIS — Z452 Encounter for adjustment and management of vascular access device: Secondary | ICD-10-CM | POA: Diagnosis not present

## 2018-08-04 DIAGNOSIS — L02612 Cutaneous abscess of left foot: Secondary | ICD-10-CM | POA: Diagnosis not present

## 2018-08-04 DIAGNOSIS — Z7902 Long term (current) use of antithrombotics/antiplatelets: Secondary | ICD-10-CM | POA: Diagnosis not present

## 2018-08-04 DIAGNOSIS — Z4781 Encounter for orthopedic aftercare following surgical amputation: Secondary | ICD-10-CM | POA: Diagnosis not present

## 2018-08-04 DIAGNOSIS — I80203 Phlebitis and thrombophlebitis of unspecified deep vessels of lower extremities, bilateral: Secondary | ICD-10-CM | POA: Diagnosis not present

## 2018-08-04 DIAGNOSIS — M86672 Other chronic osteomyelitis, left ankle and foot: Secondary | ICD-10-CM | POA: Diagnosis not present

## 2018-08-04 DIAGNOSIS — B9689 Other specified bacterial agents as the cause of diseases classified elsewhere: Secondary | ICD-10-CM | POA: Diagnosis not present

## 2018-08-04 DIAGNOSIS — Z9181 History of falling: Secondary | ICD-10-CM | POA: Diagnosis not present

## 2018-08-06 DIAGNOSIS — E1165 Type 2 diabetes mellitus with hyperglycemia: Secondary | ICD-10-CM | POA: Diagnosis not present

## 2018-08-06 DIAGNOSIS — E782 Mixed hyperlipidemia: Secondary | ICD-10-CM | POA: Diagnosis not present

## 2018-08-06 DIAGNOSIS — I10 Essential (primary) hypertension: Secondary | ICD-10-CM | POA: Diagnosis not present

## 2018-08-06 DIAGNOSIS — I739 Peripheral vascular disease, unspecified: Secondary | ICD-10-CM | POA: Diagnosis not present

## 2018-08-07 ENCOUNTER — Inpatient Hospital Stay: Payer: Medicare Other | Admitting: Infectious Diseases

## 2018-08-07 DIAGNOSIS — L03116 Cellulitis of left lower limb: Secondary | ICD-10-CM | POA: Diagnosis not present

## 2018-08-07 DIAGNOSIS — Z4781 Encounter for orthopedic aftercare following surgical amputation: Secondary | ICD-10-CM | POA: Diagnosis not present

## 2018-08-07 DIAGNOSIS — Z7902 Long term (current) use of antithrombotics/antiplatelets: Secondary | ICD-10-CM | POA: Diagnosis not present

## 2018-08-07 DIAGNOSIS — Z792 Long term (current) use of antibiotics: Secondary | ICD-10-CM | POA: Diagnosis not present

## 2018-08-07 DIAGNOSIS — E1169 Type 2 diabetes mellitus with other specified complication: Secondary | ICD-10-CM | POA: Diagnosis not present

## 2018-08-07 DIAGNOSIS — Z7984 Long term (current) use of oral hypoglycemic drugs: Secondary | ICD-10-CM | POA: Diagnosis not present

## 2018-08-07 DIAGNOSIS — I80203 Phlebitis and thrombophlebitis of unspecified deep vessels of lower extremities, bilateral: Secondary | ICD-10-CM | POA: Diagnosis not present

## 2018-08-07 DIAGNOSIS — E1152 Type 2 diabetes mellitus with diabetic peripheral angiopathy with gangrene: Secondary | ICD-10-CM | POA: Diagnosis not present

## 2018-08-07 DIAGNOSIS — L02612 Cutaneous abscess of left foot: Secondary | ICD-10-CM | POA: Diagnosis not present

## 2018-08-07 DIAGNOSIS — Z452 Encounter for adjustment and management of vascular access device: Secondary | ICD-10-CM | POA: Diagnosis not present

## 2018-08-07 DIAGNOSIS — Z89422 Acquired absence of other left toe(s): Secondary | ICD-10-CM | POA: Diagnosis not present

## 2018-08-07 DIAGNOSIS — Z9181 History of falling: Secondary | ICD-10-CM | POA: Diagnosis not present

## 2018-08-07 DIAGNOSIS — I10 Essential (primary) hypertension: Secondary | ICD-10-CM | POA: Diagnosis not present

## 2018-08-07 DIAGNOSIS — M86672 Other chronic osteomyelitis, left ankle and foot: Secondary | ICD-10-CM | POA: Diagnosis not present

## 2018-08-07 DIAGNOSIS — B9689 Other specified bacterial agents as the cause of diseases classified elsewhere: Secondary | ICD-10-CM | POA: Diagnosis not present

## 2018-08-08 ENCOUNTER — Telehealth: Payer: Self-pay | Admitting: Infectious Diseases

## 2018-08-08 DIAGNOSIS — M86672 Other chronic osteomyelitis, left ankle and foot: Secondary | ICD-10-CM | POA: Diagnosis not present

## 2018-08-08 NOTE — Telephone Encounter (Signed)
Called patient, spoke to his wife and told her not to give Iv vancomycin any more- Already got nearly 3 weeks- bone biopsy no osteo. Spoke to Dr.Troxler and he is in agreement with the plan Will inform advance home care so that they can remove {IC cline. He has an appt on Monday withDr.Troxler-

## 2018-08-11 DIAGNOSIS — Z9181 History of falling: Secondary | ICD-10-CM | POA: Diagnosis not present

## 2018-08-11 DIAGNOSIS — E1169 Type 2 diabetes mellitus with other specified complication: Secondary | ICD-10-CM | POA: Diagnosis not present

## 2018-08-11 DIAGNOSIS — Z452 Encounter for adjustment and management of vascular access device: Secondary | ICD-10-CM | POA: Diagnosis not present

## 2018-08-11 DIAGNOSIS — M86672 Other chronic osteomyelitis, left ankle and foot: Secondary | ICD-10-CM | POA: Diagnosis not present

## 2018-08-11 DIAGNOSIS — I10 Essential (primary) hypertension: Secondary | ICD-10-CM | POA: Diagnosis not present

## 2018-08-11 DIAGNOSIS — Z89422 Acquired absence of other left toe(s): Secondary | ICD-10-CM | POA: Diagnosis not present

## 2018-08-11 DIAGNOSIS — I80203 Phlebitis and thrombophlebitis of unspecified deep vessels of lower extremities, bilateral: Secondary | ICD-10-CM | POA: Diagnosis not present

## 2018-08-11 DIAGNOSIS — Z7984 Long term (current) use of oral hypoglycemic drugs: Secondary | ICD-10-CM | POA: Diagnosis not present

## 2018-08-11 DIAGNOSIS — L03116 Cellulitis of left lower limb: Secondary | ICD-10-CM | POA: Diagnosis not present

## 2018-08-11 DIAGNOSIS — Z7902 Long term (current) use of antithrombotics/antiplatelets: Secondary | ICD-10-CM | POA: Diagnosis not present

## 2018-08-11 DIAGNOSIS — B9689 Other specified bacterial agents as the cause of diseases classified elsewhere: Secondary | ICD-10-CM | POA: Diagnosis not present

## 2018-08-11 DIAGNOSIS — L02612 Cutaneous abscess of left foot: Secondary | ICD-10-CM | POA: Diagnosis not present

## 2018-08-11 DIAGNOSIS — E1152 Type 2 diabetes mellitus with diabetic peripheral angiopathy with gangrene: Secondary | ICD-10-CM | POA: Diagnosis not present

## 2018-08-11 DIAGNOSIS — Z792 Long term (current) use of antibiotics: Secondary | ICD-10-CM | POA: Diagnosis not present

## 2018-08-11 DIAGNOSIS — Z4781 Encounter for orthopedic aftercare following surgical amputation: Secondary | ICD-10-CM | POA: Diagnosis not present

## 2018-08-15 DIAGNOSIS — Z4781 Encounter for orthopedic aftercare following surgical amputation: Secondary | ICD-10-CM | POA: Diagnosis not present

## 2018-08-15 DIAGNOSIS — Z452 Encounter for adjustment and management of vascular access device: Secondary | ICD-10-CM | POA: Diagnosis not present

## 2018-08-15 DIAGNOSIS — B9689 Other specified bacterial agents as the cause of diseases classified elsewhere: Secondary | ICD-10-CM | POA: Diagnosis not present

## 2018-08-15 DIAGNOSIS — L03116 Cellulitis of left lower limb: Secondary | ICD-10-CM | POA: Diagnosis not present

## 2018-08-15 DIAGNOSIS — Z7902 Long term (current) use of antithrombotics/antiplatelets: Secondary | ICD-10-CM | POA: Diagnosis not present

## 2018-08-15 DIAGNOSIS — E1152 Type 2 diabetes mellitus with diabetic peripheral angiopathy with gangrene: Secondary | ICD-10-CM | POA: Diagnosis not present

## 2018-08-15 DIAGNOSIS — M86672 Other chronic osteomyelitis, left ankle and foot: Secondary | ICD-10-CM | POA: Diagnosis not present

## 2018-08-15 DIAGNOSIS — L02612 Cutaneous abscess of left foot: Secondary | ICD-10-CM | POA: Diagnosis not present

## 2018-08-15 DIAGNOSIS — I10 Essential (primary) hypertension: Secondary | ICD-10-CM | POA: Diagnosis not present

## 2018-08-15 DIAGNOSIS — Z792 Long term (current) use of antibiotics: Secondary | ICD-10-CM | POA: Diagnosis not present

## 2018-08-15 DIAGNOSIS — Z89422 Acquired absence of other left toe(s): Secondary | ICD-10-CM | POA: Diagnosis not present

## 2018-08-15 DIAGNOSIS — I80203 Phlebitis and thrombophlebitis of unspecified deep vessels of lower extremities, bilateral: Secondary | ICD-10-CM | POA: Diagnosis not present

## 2018-08-15 DIAGNOSIS — Z9181 History of falling: Secondary | ICD-10-CM | POA: Diagnosis not present

## 2018-08-15 DIAGNOSIS — Z7984 Long term (current) use of oral hypoglycemic drugs: Secondary | ICD-10-CM | POA: Diagnosis not present

## 2018-08-15 DIAGNOSIS — E1169 Type 2 diabetes mellitus with other specified complication: Secondary | ICD-10-CM | POA: Diagnosis not present

## 2018-08-18 DIAGNOSIS — Z4781 Encounter for orthopedic aftercare following surgical amputation: Secondary | ICD-10-CM | POA: Diagnosis not present

## 2018-08-18 DIAGNOSIS — Z452 Encounter for adjustment and management of vascular access device: Secondary | ICD-10-CM | POA: Diagnosis not present

## 2018-08-18 DIAGNOSIS — Z7984 Long term (current) use of oral hypoglycemic drugs: Secondary | ICD-10-CM | POA: Diagnosis not present

## 2018-08-18 DIAGNOSIS — E1169 Type 2 diabetes mellitus with other specified complication: Secondary | ICD-10-CM | POA: Diagnosis not present

## 2018-08-18 DIAGNOSIS — E1152 Type 2 diabetes mellitus with diabetic peripheral angiopathy with gangrene: Secondary | ICD-10-CM | POA: Diagnosis not present

## 2018-08-18 DIAGNOSIS — I10 Essential (primary) hypertension: Secondary | ICD-10-CM | POA: Diagnosis not present

## 2018-08-18 DIAGNOSIS — Z792 Long term (current) use of antibiotics: Secondary | ICD-10-CM | POA: Diagnosis not present

## 2018-08-18 DIAGNOSIS — L02612 Cutaneous abscess of left foot: Secondary | ICD-10-CM | POA: Diagnosis not present

## 2018-08-18 DIAGNOSIS — Z7902 Long term (current) use of antithrombotics/antiplatelets: Secondary | ICD-10-CM | POA: Diagnosis not present

## 2018-08-18 DIAGNOSIS — Z9181 History of falling: Secondary | ICD-10-CM | POA: Diagnosis not present

## 2018-08-18 DIAGNOSIS — B9689 Other specified bacterial agents as the cause of diseases classified elsewhere: Secondary | ICD-10-CM | POA: Diagnosis not present

## 2018-08-18 DIAGNOSIS — M86672 Other chronic osteomyelitis, left ankle and foot: Secondary | ICD-10-CM | POA: Diagnosis not present

## 2018-08-18 DIAGNOSIS — L03116 Cellulitis of left lower limb: Secondary | ICD-10-CM | POA: Diagnosis not present

## 2018-08-18 DIAGNOSIS — I80203 Phlebitis and thrombophlebitis of unspecified deep vessels of lower extremities, bilateral: Secondary | ICD-10-CM | POA: Diagnosis not present

## 2018-08-18 DIAGNOSIS — Z89422 Acquired absence of other left toe(s): Secondary | ICD-10-CM | POA: Diagnosis not present

## 2018-08-22 DIAGNOSIS — E1169 Type 2 diabetes mellitus with other specified complication: Secondary | ICD-10-CM | POA: Diagnosis not present

## 2018-08-22 DIAGNOSIS — Z9181 History of falling: Secondary | ICD-10-CM | POA: Diagnosis not present

## 2018-08-22 DIAGNOSIS — Z7902 Long term (current) use of antithrombotics/antiplatelets: Secondary | ICD-10-CM | POA: Diagnosis not present

## 2018-08-22 DIAGNOSIS — E1152 Type 2 diabetes mellitus with diabetic peripheral angiopathy with gangrene: Secondary | ICD-10-CM | POA: Diagnosis not present

## 2018-08-22 DIAGNOSIS — B9689 Other specified bacterial agents as the cause of diseases classified elsewhere: Secondary | ICD-10-CM | POA: Diagnosis not present

## 2018-08-22 DIAGNOSIS — Z792 Long term (current) use of antibiotics: Secondary | ICD-10-CM | POA: Diagnosis not present

## 2018-08-22 DIAGNOSIS — Z452 Encounter for adjustment and management of vascular access device: Secondary | ICD-10-CM | POA: Diagnosis not present

## 2018-08-22 DIAGNOSIS — M86672 Other chronic osteomyelitis, left ankle and foot: Secondary | ICD-10-CM | POA: Diagnosis not present

## 2018-08-22 DIAGNOSIS — L02612 Cutaneous abscess of left foot: Secondary | ICD-10-CM | POA: Diagnosis not present

## 2018-08-22 DIAGNOSIS — I80203 Phlebitis and thrombophlebitis of unspecified deep vessels of lower extremities, bilateral: Secondary | ICD-10-CM | POA: Diagnosis not present

## 2018-08-22 DIAGNOSIS — L03116 Cellulitis of left lower limb: Secondary | ICD-10-CM | POA: Diagnosis not present

## 2018-08-22 DIAGNOSIS — I10 Essential (primary) hypertension: Secondary | ICD-10-CM | POA: Diagnosis not present

## 2018-08-22 DIAGNOSIS — Z4781 Encounter for orthopedic aftercare following surgical amputation: Secondary | ICD-10-CM | POA: Diagnosis not present

## 2018-08-22 DIAGNOSIS — Z89422 Acquired absence of other left toe(s): Secondary | ICD-10-CM | POA: Diagnosis not present

## 2018-08-22 DIAGNOSIS — Z7984 Long term (current) use of oral hypoglycemic drugs: Secondary | ICD-10-CM | POA: Diagnosis not present

## 2018-08-26 DIAGNOSIS — Z9181 History of falling: Secondary | ICD-10-CM | POA: Diagnosis not present

## 2018-08-26 DIAGNOSIS — L03116 Cellulitis of left lower limb: Secondary | ICD-10-CM | POA: Diagnosis not present

## 2018-08-26 DIAGNOSIS — Z452 Encounter for adjustment and management of vascular access device: Secondary | ICD-10-CM | POA: Diagnosis not present

## 2018-08-26 DIAGNOSIS — I10 Essential (primary) hypertension: Secondary | ICD-10-CM | POA: Diagnosis not present

## 2018-08-26 DIAGNOSIS — I80203 Phlebitis and thrombophlebitis of unspecified deep vessels of lower extremities, bilateral: Secondary | ICD-10-CM | POA: Diagnosis not present

## 2018-08-26 DIAGNOSIS — E1169 Type 2 diabetes mellitus with other specified complication: Secondary | ICD-10-CM | POA: Diagnosis not present

## 2018-08-26 DIAGNOSIS — M86672 Other chronic osteomyelitis, left ankle and foot: Secondary | ICD-10-CM | POA: Diagnosis not present

## 2018-08-26 DIAGNOSIS — Z7984 Long term (current) use of oral hypoglycemic drugs: Secondary | ICD-10-CM | POA: Diagnosis not present

## 2018-08-26 DIAGNOSIS — E1152 Type 2 diabetes mellitus with diabetic peripheral angiopathy with gangrene: Secondary | ICD-10-CM | POA: Diagnosis not present

## 2018-08-26 DIAGNOSIS — B9689 Other specified bacterial agents as the cause of diseases classified elsewhere: Secondary | ICD-10-CM | POA: Diagnosis not present

## 2018-08-26 DIAGNOSIS — Z792 Long term (current) use of antibiotics: Secondary | ICD-10-CM | POA: Diagnosis not present

## 2018-08-26 DIAGNOSIS — Z7902 Long term (current) use of antithrombotics/antiplatelets: Secondary | ICD-10-CM | POA: Diagnosis not present

## 2018-08-26 DIAGNOSIS — L02612 Cutaneous abscess of left foot: Secondary | ICD-10-CM | POA: Diagnosis not present

## 2018-08-26 DIAGNOSIS — Z89422 Acquired absence of other left toe(s): Secondary | ICD-10-CM | POA: Diagnosis not present

## 2018-08-26 DIAGNOSIS — Z4781 Encounter for orthopedic aftercare following surgical amputation: Secondary | ICD-10-CM | POA: Diagnosis not present

## 2018-09-01 DIAGNOSIS — Z9181 History of falling: Secondary | ICD-10-CM | POA: Diagnosis not present

## 2018-09-01 DIAGNOSIS — Z89422 Acquired absence of other left toe(s): Secondary | ICD-10-CM | POA: Diagnosis not present

## 2018-09-01 DIAGNOSIS — Z452 Encounter for adjustment and management of vascular access device: Secondary | ICD-10-CM | POA: Diagnosis not present

## 2018-09-01 DIAGNOSIS — Z7902 Long term (current) use of antithrombotics/antiplatelets: Secondary | ICD-10-CM | POA: Diagnosis not present

## 2018-09-01 DIAGNOSIS — Z4781 Encounter for orthopedic aftercare following surgical amputation: Secondary | ICD-10-CM | POA: Diagnosis not present

## 2018-09-01 DIAGNOSIS — E1169 Type 2 diabetes mellitus with other specified complication: Secondary | ICD-10-CM | POA: Diagnosis not present

## 2018-09-01 DIAGNOSIS — L03116 Cellulitis of left lower limb: Secondary | ICD-10-CM | POA: Diagnosis not present

## 2018-09-01 DIAGNOSIS — E1152 Type 2 diabetes mellitus with diabetic peripheral angiopathy with gangrene: Secondary | ICD-10-CM | POA: Diagnosis not present

## 2018-09-01 DIAGNOSIS — I80203 Phlebitis and thrombophlebitis of unspecified deep vessels of lower extremities, bilateral: Secondary | ICD-10-CM | POA: Diagnosis not present

## 2018-09-01 DIAGNOSIS — Z792 Long term (current) use of antibiotics: Secondary | ICD-10-CM | POA: Diagnosis not present

## 2018-09-01 DIAGNOSIS — B9689 Other specified bacterial agents as the cause of diseases classified elsewhere: Secondary | ICD-10-CM | POA: Diagnosis not present

## 2018-09-01 DIAGNOSIS — M86672 Other chronic osteomyelitis, left ankle and foot: Secondary | ICD-10-CM | POA: Diagnosis not present

## 2018-09-01 DIAGNOSIS — I10 Essential (primary) hypertension: Secondary | ICD-10-CM | POA: Diagnosis not present

## 2018-09-01 DIAGNOSIS — Z7984 Long term (current) use of oral hypoglycemic drugs: Secondary | ICD-10-CM | POA: Diagnosis not present

## 2018-09-01 DIAGNOSIS — L02612 Cutaneous abscess of left foot: Secondary | ICD-10-CM | POA: Diagnosis not present

## 2018-09-04 DIAGNOSIS — E113513 Type 2 diabetes mellitus with proliferative diabetic retinopathy with macular edema, bilateral: Secondary | ICD-10-CM | POA: Diagnosis not present

## 2018-09-05 DIAGNOSIS — Z89432 Acquired absence of left foot: Secondary | ICD-10-CM | POA: Diagnosis not present

## 2018-09-05 DIAGNOSIS — I739 Peripheral vascular disease, unspecified: Secondary | ICD-10-CM | POA: Diagnosis not present

## 2018-09-05 DIAGNOSIS — G629 Polyneuropathy, unspecified: Secondary | ICD-10-CM | POA: Diagnosis not present

## 2018-09-05 DIAGNOSIS — E119 Type 2 diabetes mellitus without complications: Secondary | ICD-10-CM | POA: Diagnosis not present

## 2018-09-11 ENCOUNTER — Encounter (INDEPENDENT_AMBULATORY_CARE_PROVIDER_SITE_OTHER): Payer: Self-pay | Admitting: Nurse Practitioner

## 2018-09-11 ENCOUNTER — Other Ambulatory Visit (INDEPENDENT_AMBULATORY_CARE_PROVIDER_SITE_OTHER): Payer: Self-pay | Admitting: Nurse Practitioner

## 2018-09-11 ENCOUNTER — Ambulatory Visit (INDEPENDENT_AMBULATORY_CARE_PROVIDER_SITE_OTHER): Payer: Medicare Other | Admitting: Nurse Practitioner

## 2018-09-11 ENCOUNTER — Ambulatory Visit (INDEPENDENT_AMBULATORY_CARE_PROVIDER_SITE_OTHER): Payer: Medicare Other

## 2018-09-11 VITALS — BP 126/70 | HR 74 | Resp 14 | Ht 68.0 in | Wt 174.6 lb

## 2018-09-11 DIAGNOSIS — I739 Peripheral vascular disease, unspecified: Secondary | ICD-10-CM

## 2018-09-11 DIAGNOSIS — I7025 Atherosclerosis of native arteries of other extremities with ulceration: Secondary | ICD-10-CM | POA: Diagnosis not present

## 2018-09-11 DIAGNOSIS — I10 Essential (primary) hypertension: Secondary | ICD-10-CM

## 2018-09-11 DIAGNOSIS — E1152 Type 2 diabetes mellitus with diabetic peripheral angiopathy with gangrene: Secondary | ICD-10-CM

## 2018-09-11 NOTE — Progress Notes (Signed)
Subjective:    Patient ID: [name], male    DOB: [birth year], 77 y.o.   MRN: [id number] Chief Complaint  Patient presents with  . Follow-up    HPI  [name] is a 77 y.o. male that presents today for evaluation of his peripheral artery disease.  He is present with his wife.  Since his last visit the patient has had a transmetatarsal amputation of his left lower extremity.  He endorses having some phantom limb pain but he denies any claudication-like symptoms.  He describes having some pain and discomfort in his leg with elevation, however the patient is a somewhat poor historian so it is unclear if this is due to position or if these are rest pain like symptoms.  The transmetatarsal wound is completely healed, although it is somewhat swollen.  The patient denies any issues currently with ambulation.  The patient denies any fever, chills, nausea, vomiting or diarrhea.  Patient denies any chest pain or shortness of breath.  He denies any amaurosis ejects or TIA-like symptoms.  The patient underwent a left lower extremity arterial duplex which revealed an occluded distal anterior tibial artery with a 75 to 99% stenosis of the proximal to mid posterior tibial artery.  He has monophasic waveforms throughout the anterior and posterior tibial arteries.  With biphasic flows in the proximal peroneal artery and monophasic within the distal.  His most recent ABIs on 04/28/2018 were the right at 1.01 with a left at 1.15.  Past Medical History:  Diagnosis Date  . Diabetes mellitus without complication [facility])     Past Surgical History:  Procedure Laterality Date  . AMPUTATION TOE Left 03/28/2018   Procedure: AMPUTATION TOE/ PARTIAL RAY RESECTION-LEFT 2ND AND 3RD;  Surgeon: Linus Galas, DPM;  Location: [facility] ORS;  Service: Podiatry;  Laterality: Left;  . HERNIA REPAIR    . LOWER EXTREMITY ANGIOGRAPHY Left 03/26/2018   Procedure: Lower Extremity Angiography;  Surgeon: Renford Dills, MD;   Location: [facility] INVASIVE [facility] LAB;  Service: Cardiovascular;  Laterality: Left;  . 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   Procedure: TRANSMETATARSAL AMPUTATION;  Surgeon: Recardo Evangelist, DPM;  Location: [facility] ORS;  Service: Podiatry;  Laterality: Left;    Social History   Socioeconomic History  . Marital status: Married    Spouse name: Not on file  . Number of children: Not on file  . Years of education: Not on file  . Highest education level: Not on file  Occupational History  . Not on file  Social Needs  . Financial resource strain: Not on file  . Food insecurity:    Worry: Not on file    Inability: Not on file  . Transportation needs:    Medical: Not on file    Non-medical: Not on file  Tobacco Use  . Smoking status: Former Smoker    Types: Cigarettes  . Smokeless tobacco: Never Used  Substance and Sexual Activity  . Alcohol use: Never    Frequency: Never  . Drug use: Never  . Sexual activity: Never  Lifestyle  . Physical activity:    Days per week: Not on file    Minutes per session: Not on file  . Stress: Not on file  Relationships  . Social connections:    Talks on phone: Not on file    Gets together: Not on file    Attends religious service: Not on file    Active member of club or organization: Not on file  Attends meetings of clubs or organizations: Not on file    Relationship status: Not on file  . Intimate partner violence:    Fear of current or ex partner: Not on file    Emotionally abused: Not on file    Physically abused: Not on file    Forced sexual activity: Not on file  Other Topics Concern  . Not on file  Social History Narrative  . Not on file    History reviewed. No pertinent family history.  Allergies  Allergen Reactions  . Flu Virus Vaccine Other (See Comments) and Nausea And Vomiting    Fever, chills, vomiting.      Review of Systems   Review of Systems: Negative Unless Checked Constitutional: [] Weight loss  [] Fever   [] Chills Cardiac: [] Chest pain   []  Atrial Fibrillation  [] Palpitations   [] Shortness of breath when laying flat   [] Shortness of breath with exertion. [] Shortness of breath at rest Vascular:  [] Pain in legs with walking   [] Pain in legs with standing [] Pain in legs when laying flat   [] Claudication    [] Pain in feet when laying flat    [] History of DVT   [] Phlebitis   [x] Swelling in legs   [] Varicose veins   [] Non-healing ulcers Pulmonary:   [] Uses home oxygen   [] Productive cough   [] Hemoptysis   [] Wheeze  [] COPD   [] Asthma Neurologic:  [] Dizziness   [] Seizures  [] Blackouts [] History of stroke   [] History of TIA  [] Aphasia   [] Temporary Blindness   [] Weakness or numbness in arm   [x] Weakness or numbness in leg Musculoskeletal:   [] Joint swelling   [] Joint pain   [] Low back pain  []  History of Knee Replacement [] Arthritis [] back Surgeries  []  Spinal Stenosis    Hematologic:  [] Easy bruising  [] Easy bleeding   [] Hypercoagulable state   [] Anemic Gastrointestinal:  [] Diarrhea   [] Vomiting  [] Gastroesophageal reflux/heartburn   [] Difficulty swallowing. [] Abdominal pain Genitourinary:  [] Chronic kidney disease   [] Difficult urination  [] Anuric   [] Blood in urine [] Frequent urination  [] Burning with urination   [] Hematuria Skin:  [] Rashes   [] Ulcers [] Wounds Psychological:  [] History of anxiety   []  History of major depression  []  Memory Difficulties     Objective:   Physical Exam  BP 126/70 (BP Location: Right Arm, Patient Position: Sitting)   Pulse 74   Resp 14   Ht 5\' 8"  (1.727 m)   Wt 174 lb 9.6 oz (79.2 kg)   BMI 26.55 kg/m   Gen: WD/WN, NAD Head: [place]/AT, No temporalis wasting.  Ear/Nose/Throat: Hearing grossly intact, nares w/o erythema or drainage Eyes: PER, EOMI, sclera nonicteric.  Neck: Supple, no masses.  No JVD.  Pulmonary:  Good air movement, no use of accessory muscles.  Cardiac: RRR Vascular:  Well-healed lower extremity transmetatarsal amputation.  2+ pitting edema at level  of the ankle up calf on the left lower extremity Vessel Right Left  Radial Palpable Palpable  Dorsalis Pedis Palpable Not Palpable  Posterior Tibial Palpable Not Palpable   Gastrointestinal: soft, non-distended. No guarding/no peritoneal signs.  Musculoskeletal: M/S 5/5 throughout.  Transmetatarsal amputation  neurologic: Pain and light touch intact in extremities.  Symmetrical.  Speech is fluent. Motor exam as listed above. Psychiatric: Judgment intact, Mood & affect appropriate for pt's clinical situation. Dermatologic: No Venous rashes. No Ulcers Noted.  No changes consistent with cellulitis. Lymph : No Cervical lymphadenopathy, no lichenification or skin changes of chronic lymphedema.      Assessment &  Plan:   1. Atherosclerosis of native arteries of the extremities with ulceration (HCC) The patient underwent a left lower extremity arterial duplex which revealed an occluded distal anterior tibial artery with a 75 to 99% stenosis of the proximal to mid posterior tibial artery.  He has monophasic waveforms throughout the anterior and posterior tibial arteries.  With biphasic flows in the proximal peroneal artery and monophasic within the distal.  His most recent ABIs on 04/28/2018 were the right at 1.01 with a left at 1.15.  The patient currently has no evidence of ulceration at this time.  He is currently in the process of healing a transmetatarsal amputation.  It was well approximated and well-healed from the outside.  The patient's left lower extremity duplex is somewhat concerning due to the fact that there appears to be some occlusion of the distal anterior tibial artery as well as a 75 to 99% stenosis of the posterior tibial artery.  There is concern due to the fact that the patient's anterior tibial artery has some occlusion as well as is monophasic flows within the rest of the tibial arteries that without intervention there could possibly be further occlusions which may necessitate  further amputations.  This was explained to the patient and his wife.  The patient currently does not want to undergo any intervention at this time.  He is very hesitant to do so.  We will have the patient return in 6 weeks with bilateral ABIs to determine his wound healing status as well as needed for possible intervention. - VAS US ABI WITH/WO TBI; Future  2. Essential hypertension Continue antihypertensive medications as already ordered, these medications have been reviewed and there are no changes at this time.   3. Type 2 diabetes mellitus with diabetic peripheral angiopathy and gangrene, without long-term current use of insulin (HCC) Continue hypoglycemic medications as already ordered, these medications have been reviewed and there are no changes at this time.  Hgb A1C to be monitored as already arranged by primary service    Current Outpatient Medications on File Prior to Visit  Medication Sig Dispense Refill  . amLODipine (NORVASC) 2.5 MG tablet Take 2.5 mg by mouth daily.  2  . clopidogrel (PLAVIX) 75 MG tablet Take 1 tablet (75 mg total) by mouth daily. 30 tablet 4  . valsartan (DIOVAN) 80 MG tablet Take 80 mg by mouth daily.     No current facility-administered medications on file prior to visit.     There are no Patient Instructions on file for this visit. No follow-ups on file.   Georgiana SpinnerFallon E Zeyad Delaguila, NP  This note was completed with Office managerDragon Dictation.  Any errors are purely unintentional.

## 2018-09-17 DIAGNOSIS — Z9842 Cataract extraction status, left eye: Secondary | ICD-10-CM

## 2018-09-17 DIAGNOSIS — Z9841 Cataract extraction status, right eye: Secondary | ICD-10-CM

## 2018-09-17 HISTORY — DX: Cataract extraction status, right eye: Z98.41

## 2018-09-17 HISTORY — DX: Cataract extraction status, left eye: Z98.42

## 2018-10-08 DIAGNOSIS — E113513 Type 2 diabetes mellitus with proliferative diabetic retinopathy with macular edema, bilateral: Secondary | ICD-10-CM | POA: Diagnosis not present

## 2018-10-20 DIAGNOSIS — Z89432 Acquired absence of left foot: Secondary | ICD-10-CM | POA: Diagnosis not present

## 2018-10-20 DIAGNOSIS — I739 Peripheral vascular disease, unspecified: Secondary | ICD-10-CM | POA: Diagnosis not present

## 2018-10-20 DIAGNOSIS — E1142 Type 2 diabetes mellitus with diabetic polyneuropathy: Secondary | ICD-10-CM | POA: Diagnosis not present

## 2018-10-23 ENCOUNTER — Ambulatory Visit (INDEPENDENT_AMBULATORY_CARE_PROVIDER_SITE_OTHER): Payer: Medicare Other | Admitting: Vascular Surgery

## 2018-10-23 ENCOUNTER — Encounter (INDEPENDENT_AMBULATORY_CARE_PROVIDER_SITE_OTHER): Payer: Medicare Other

## 2018-11-03 ENCOUNTER — Ambulatory Visit (INDEPENDENT_AMBULATORY_CARE_PROVIDER_SITE_OTHER): Payer: Medicare Other | Admitting: Vascular Surgery

## 2018-11-03 ENCOUNTER — Ambulatory Visit (INDEPENDENT_AMBULATORY_CARE_PROVIDER_SITE_OTHER): Payer: Medicare Other

## 2018-11-03 ENCOUNTER — Encounter (INDEPENDENT_AMBULATORY_CARE_PROVIDER_SITE_OTHER): Payer: Self-pay | Admitting: Vascular Surgery

## 2018-11-03 VITALS — BP 139/64 | HR 64 | Resp 17 | Ht 68.0 in | Wt 178.0 lb

## 2018-11-03 DIAGNOSIS — E1151 Type 2 diabetes mellitus with diabetic peripheral angiopathy without gangrene: Secondary | ICD-10-CM | POA: Diagnosis not present

## 2018-11-03 DIAGNOSIS — I7025 Atherosclerosis of native arteries of other extremities with ulceration: Secondary | ICD-10-CM | POA: Diagnosis not present

## 2018-11-03 DIAGNOSIS — Z87891 Personal history of nicotine dependence: Secondary | ICD-10-CM | POA: Diagnosis not present

## 2018-11-03 DIAGNOSIS — I70213 Atherosclerosis of native arteries of extremities with intermittent claudication, bilateral legs: Secondary | ICD-10-CM | POA: Diagnosis not present

## 2018-11-03 DIAGNOSIS — I10 Essential (primary) hypertension: Secondary | ICD-10-CM | POA: Diagnosis not present

## 2018-11-03 DIAGNOSIS — E1152 Type 2 diabetes mellitus with diabetic peripheral angiopathy with gangrene: Secondary | ICD-10-CM

## 2018-11-04 ENCOUNTER — Encounter (INDEPENDENT_AMBULATORY_CARE_PROVIDER_SITE_OTHER): Payer: Self-pay | Admitting: Vascular Surgery

## 2018-11-04 NOTE — Progress Notes (Signed)
MRN : [id number]  [name] is a 78 y.o. (04-28-1941) male who presents with chief complaint of  Chief Complaint  Patient presents with  . Follow-up    6week abi  .  History of Present Illness:   The patient returns to the office for followup and review of the noninvasive studies. There has been a significant deterioration in the lower extremity symptoms.  The patient notes interval shortening of their claudication distance and development of mild rest pain symptoms. No new ulcers or wounds have occurred since the last visit.  There have been no significant changes to the patient's overall health care.  The patient denies amaurosis fugax or recent TIA symptoms. There are no recent neurological changes noted. The patient denies history of DVT, PE or superficial thrombophlebitis. The patient denies recent episodes of angina or shortness of breath.     Current Meds  Medication Sig  . amLODipine (NORVASC) 2.5 MG tablet Take 2.5 mg by mouth daily.  . clopidogrel (PLAVIX) 75 MG tablet Take 1 tablet (75 mg total) by mouth daily.  . Multiple Vitamin (MULTIVITAMIN) tablet Take 1 tablet by mouth daily.  . valsartan (DIOVAN) 80 MG tablet Take 80 mg by mouth daily.    Past Medical History:  Diagnosis Date  . Diabetes mellitus without complication [facility](HCC)     Past Surgical History:  Procedure Laterality Date  . AMPUTATION TOE Left 03/28/2018   Procedure: AMPUTATION TOE/ PARTIAL RAY RESECTION-LEFT 2ND AND 3RD;  Surgeon: Linus Galasline, Todd, DPM;  Location: [facility] ORS;  Service: Podiatry;  Laterality: Left;  . HERNIA REPAIR    . LOWER EXTREMITY ANGIOGRAPHY Left 03/26/2018   Procedure: Lower Extremity Angiography;  Surgeon: Renford DillsSchnier, Lorenzo Arscott G, MD;  Location: [facility] INVASIVE CV LAB;  Service: Cardiovascular;  Laterality: Left;  . 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 Smoking status: Former Smoker    Types: Cigarettes  . Smokeless tobacco: Never Used  Substance Use Topics  . Alcohol use: Never    Frequency: Never  . Drug use: Never    Family History Family History  Problem Relation Age of Onset  . Diabetes Mother   . Cancer Father   . Hypertension Maternal Grandfather   . Heart attack Maternal Grandfather     Allergies  Allergen Reactions  . Flu Virus Vaccine Other (See Comments) and Nausea And Vomiting    Fever, chills, vomiting.      REVIEW OF SYSTEMS (Negative unless checked)  Constitutional: [] Weight loss  [] Fever  [] Chills Cardiac: [] Chest pain   [] Chest pressure   [] Palpitations   [] Shortness of breath when laying flat   [] Shortness of breath with exertion. Vascular:  [x] Pain in legs with walking   [] Pain in legs at rest  [] History of DVT   [] Phlebitis   [] Swelling in legs   [] Varicose veins   [] Non-healing ulcers Pulmonary:   [] Uses home oxygen   [] Productive cough   [] Hemoptysis   [] Wheeze  [] COPD   [] Asthma Neurologic:  [] Dizziness   [] Seizures   [] History of stroke   [] History of TIA  [] Aphasia   [] Vissual changes   [] Weakness or numbness in arm   [] Weakness or numbness in leg Musculoskeletal:   [] Joint swelling   [] Joint pain   [] Low back pain Hematologic:  [] Easy bruising  [] Easy bleeding   []   Hypercoagulable state   [] Anemic Gastrointestinal:  [] Diarrhea   [] Vomiting  [] Gastroesophageal reflux/heartburn   [] Difficulty swallowing. Genitourinary:  [] Chronic kidney disease   [] Difficult urination  [] Frequent urination   [] Blood in urine Skin:  [] Rashes   [] Ulcers  Psychological:  [] History of anxiety   []  History of major depression.  Physical Examination  Vitals:   11/03/18 1551  BP: 139/64  Pulse: 64  Resp: 17  Weight: 178 lb (80.7 kg)  Height: 5\' 8"  (1.727 m)   Body mass index is 27.06 kg/m. Gen: WD/WN, NAD Head: [place]/AT, No temporalis wasting.    Ear/Nose/Throat: Hearing grossly intact, nares w/o erythema or drainage Eyes: PER, EOMI, sclera nonicteric.  Neck: Supple, no large masses.   Pulmonary:  Good air movement, no audible wheezing bilaterally, no use of accessory muscles.  Cardiac: RRR, no JVD Vascular:  Vessel Right Left  Radial Palpable Palpable  PT Palpable Palpable  DP Palpable Palpable  Gastrointestinal: Non-distended. No guarding/no peritoneal signs.  Musculoskeletal: M/S 5/5 throughout.  No deformity or atrophy.  Neurologic: CN 2-12 intact. Symmetrical.  Speech is fluent. Motor exam as listed above. Psychiatric: Judgment intact, Mood & affect appropriate for pt's clinical situation. Dermatologic: No rashes or ulcers noted.  No changes consistent with cellulitis. Lymph : No lichenification or skin changes of chronic lymphedema.  CBC Lab Results  Component Value Date   WBC 9.3 07/21/2018   HGB 9.5 (L) 07/21/2018   HCT 29.6 (L) 07/21/2018   MCV 93.7 07/21/2018   PLT 231 07/21/2018    BMET    Component Value Date/Time   NA 136 07/21/2018 0418   K 4.6 07/21/2018 0418   CL 102 07/21/2018 0418   CO2 26 07/21/2018 0418   GLUCOSE 100 (H) 07/21/2018 0418   BUN 21 07/21/2018 0418   CREATININE 0.92 07/30/2018 1045   CALCIUM 9.0 07/21/2018 0418   GFRNONAA >60 07/30/2018 1045   GFRAA >60 07/30/2018 1045   CrCl cannot be calculated (Patient's most recent lab result is older than the maximum 21 days allowed.).  COAG Lab Results  Component Value Date   INR 1.25 07/18/2018    Radiology Vas [place] Abi With/wo Tbi  Result Date: 11/03/2018 LOWER EXTREMITY DOPPLER STUDY Indications: Peripheral artery disease. Other Factors: Left 2nd & 3rd toe amputation on 03/28/18.  Vascular Interventions: 03/26/18: Left SFA, popliteal, posterior tibial &                         peroneal artery PTAs. Comparison Study: 04/28/2018 Performing Technologist: Salvadore Farber RVT  Examination Guidelines: A complete evaluation includes at minimum,  Doppler waveform signals and systolic blood pressure reading at the level of bilateral brachial, anterior tibial, and posterior tibial arteries, when vessel segments are accessible. Bilateral testing is considered an integral part of a complete examination. Photoelectric Plethysmograph (PPG) waveforms and toe systolic pressure readings are included as required and additional duplex testing as needed. Limited examinations for reoccurring indications may be performed as noted.  ABI Findings: +--------+------------------+-----+--------+--------+ Right   Rt Pressure (mmHg)IndexWaveformComment  +--------+------------------+-----+--------+--------+ [id number]                                     +--------+------------------+-----+--------+--------+ ATA     104               0.80 biphasic         +--------+------------------+-----+--------+--------+ PTA  148               1.14 biphasic         +--------+------------------+-----+--------+--------+ +--------+------------------+-----+----------+-------+ Left    Lt Pressure (mmHg)IndexWaveform  Comment +--------+------------------+-----+----------+-------+ [id number]                                      +--------+------------------+-----+----------+-------+ ATA     87                0.67 monophasic        +--------+------------------+-----+----------+-------+ PTA     118               0.91 monophasic        +--------+------------------+-----+----------+-------+ +-------+-----------+-----------+------------+------------+ ABI/TBIToday's ABIToday's TBIPrevious ABIPrevious TBI +-------+-----------+-----------+------------+------------+ Right  1.14                  1.01                     +-------+-----------+-----------+------------+------------+ Left   .91                   1.1                      +-------+-----------+-----------+------------+------------+ Right ABIs appear essentially unchanged compared to  prior study on 826/2019. Left ABIs appear decreased compared to prior study on 05/12/2018.  Summary: Right: Resting right ankle-brachial index is within normal range. No evidence of significant right lower extremity arterial disease. Left: Resting left ankle-brachial index indicates mild left lower extremity arterial disease. ABI mildly decreased.  *See table(s) above for measurements and observations.  Electronically signed by [name] MD on 11/03/2018 at 4:38:43 PM.    Final      Assessment/Plan 1. Atherosclerosis of native arteries of the extremities with ulceration (HCC)  Recommend:  The patient has evidence of atherosclerosis of the lower extremities with claudication.  The patient does not voice lifestyle limiting changes at this point in time.  Noninvasive studies do not suggest clinically significant change.  No invasive studies, angiography or surgery at this time The patient should continue walking and begin a more formal exercise program.  The patient should continue antiplatelet therapy and aggressive treatment of the lipid abnormalities  No changes in the patient's medications at this time  The patient should continue wearing graduated compression socks 10-15 mmHg strength to control the mild edema.    2. Essential hypertension Continue antihypertensive medications as already ordered, these medications have been reviewed and there are no changes at this time.   3. Type 2 diabetes mellitus with diabetic peripheral angiopathy and gangrene, without long-term current use of insulin (HCC) Continue hypoglycemic medications as already ordered, these medications have been reviewed and there are no changes at this time.  Hgb A1C to be monitored as already arranged by primary service     [name], MD  11/04/2018 9:56 PM

## 2018-11-06 DIAGNOSIS — E1165 Type 2 diabetes mellitus with hyperglycemia: Secondary | ICD-10-CM | POA: Diagnosis not present

## 2018-11-13 DIAGNOSIS — E119 Type 2 diabetes mellitus without complications: Secondary | ICD-10-CM | POA: Diagnosis not present

## 2018-11-13 DIAGNOSIS — I10 Essential (primary) hypertension: Secondary | ICD-10-CM | POA: Diagnosis not present

## 2018-11-13 DIAGNOSIS — E782 Mixed hyperlipidemia: Secondary | ICD-10-CM | POA: Diagnosis not present

## 2018-11-13 DIAGNOSIS — D649 Anemia, unspecified: Secondary | ICD-10-CM | POA: Diagnosis not present

## 2018-11-13 DIAGNOSIS — I739 Peripheral vascular disease, unspecified: Secondary | ICD-10-CM | POA: Diagnosis not present

## 2018-11-19 DIAGNOSIS — E113513 Type 2 diabetes mellitus with proliferative diabetic retinopathy with macular edema, bilateral: Secondary | ICD-10-CM | POA: Diagnosis not present

## 2018-11-25 DIAGNOSIS — H2512 Age-related nuclear cataract, left eye: Secondary | ICD-10-CM | POA: Diagnosis not present

## 2018-11-26 DIAGNOSIS — D649 Anemia, unspecified: Secondary | ICD-10-CM | POA: Diagnosis not present

## 2018-12-09 IMAGING — DX DG FOOT COMPLETE 3+V*L*
3 series · 3 of 3 positions shown · non-contrast
Comparison: None.

CLINICAL DATA: Suspected gangrene in a diabetic patient. Foot
swelling.

EXAM:
LEFT FOOT - COMPLETE 3+ VIEW

[foot lat]
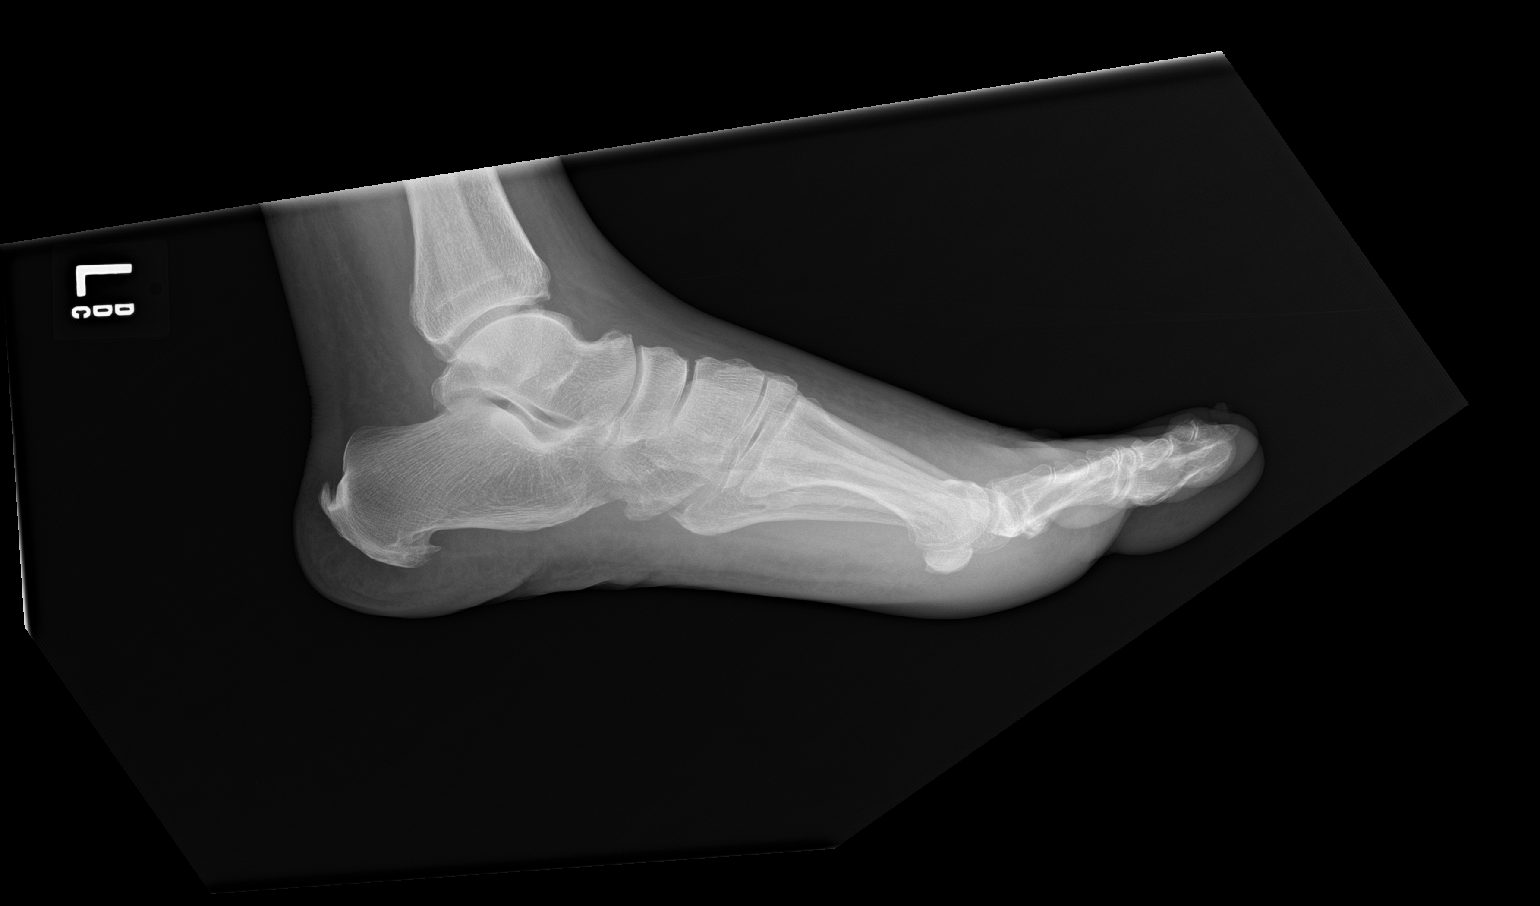

[foot ap]
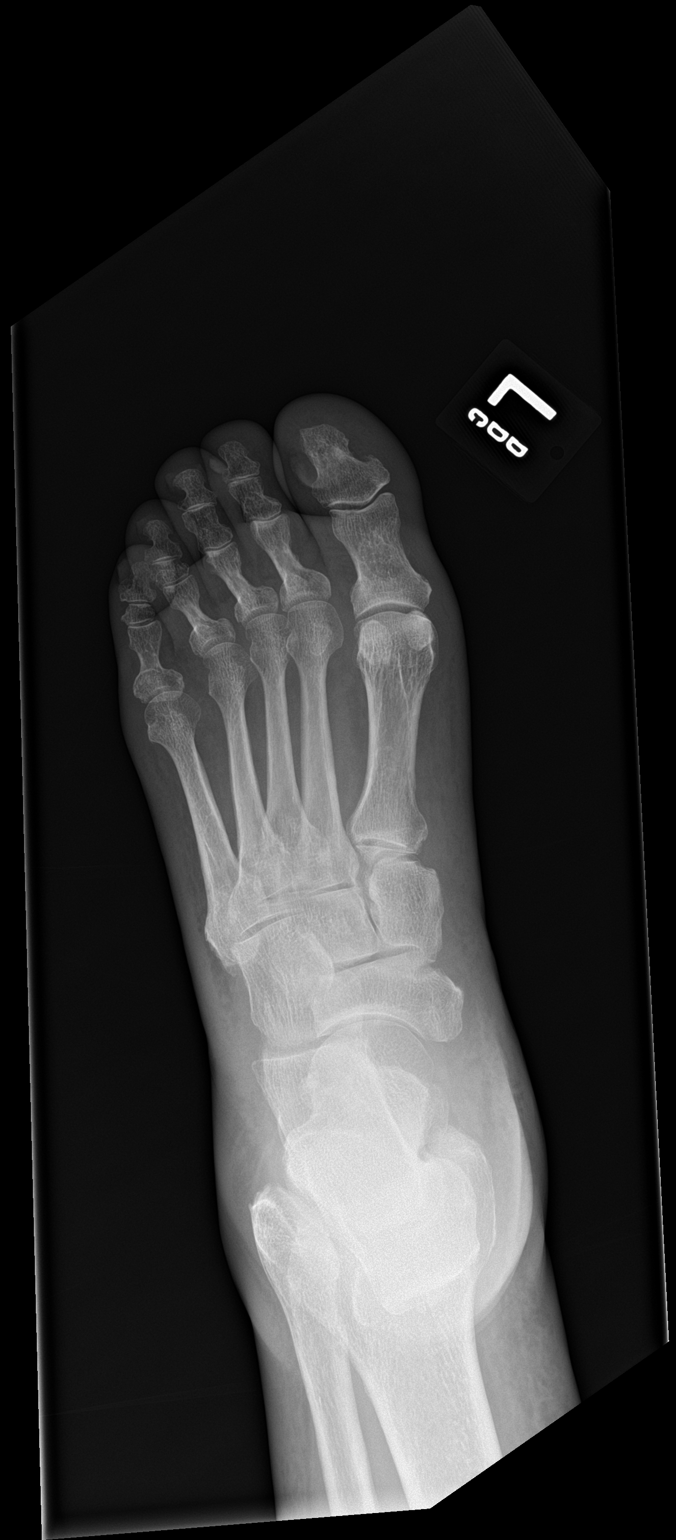

[foot obl]
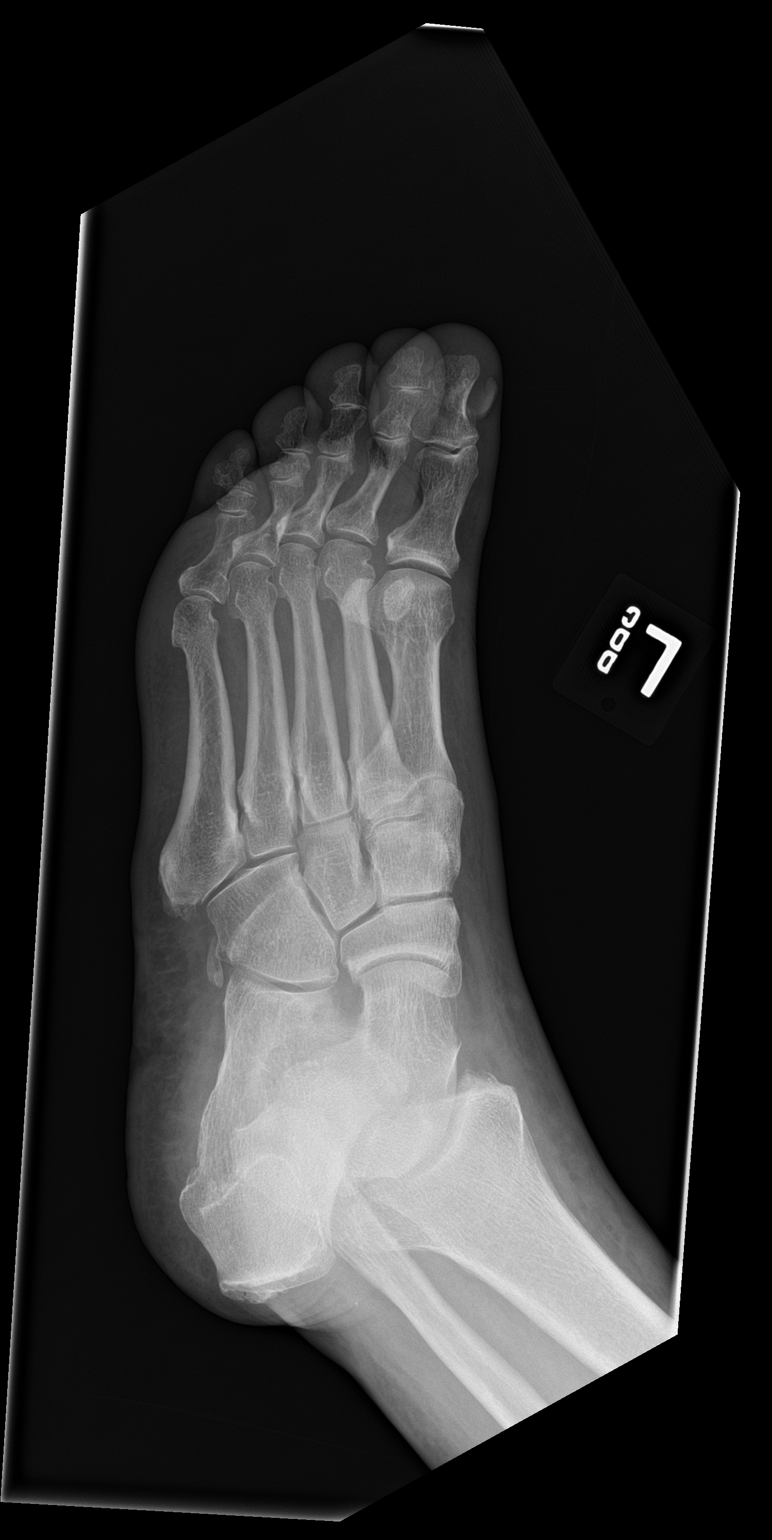

[3 of 3 positions shown; findings below may reference images not displayed]

FINDINGS: There is no evidence of fracture or dislocation. There is no
evidence of arthropathy or other focal bone abnormality. Mild
diffuse soft tissue swelling. No air is seen in the soft tissues.
There are plantar and Achilles spurs.
IMPRESSION: No plain film findings of osteomyelitis, nor air in the soft
tissues.

## 2018-12-18 DIAGNOSIS — E113513 Type 2 diabetes mellitus with proliferative diabetic retinopathy with macular edema, bilateral: Secondary | ICD-10-CM | POA: Diagnosis not present

## 2018-12-22 ENCOUNTER — Ambulatory Visit: Admit: 2018-12-22 | Payer: Medicare Other | Admitting: Ophthalmology

## 2018-12-22 SURGERY — PHACOEMULSIFICATION, CATARACT, WITH IOL INSERTION
Anesthesia: Topical | Laterality: Left

## 2019-01-07 DIAGNOSIS — E1142 Type 2 diabetes mellitus with diabetic polyneuropathy: Secondary | ICD-10-CM | POA: Diagnosis not present

## 2019-01-07 DIAGNOSIS — B351 Tinea unguium: Secondary | ICD-10-CM | POA: Diagnosis not present

## 2019-02-02 ENCOUNTER — Encounter: Payer: Self-pay | Admitting: *Deleted

## 2019-02-02 ENCOUNTER — Other Ambulatory Visit: Payer: Self-pay

## 2019-02-04 DIAGNOSIS — D649 Anemia, unspecified: Secondary | ICD-10-CM | POA: Diagnosis not present

## 2019-02-04 DIAGNOSIS — Z89422 Acquired absence of other left toe(s): Secondary | ICD-10-CM | POA: Diagnosis not present

## 2019-02-04 DIAGNOSIS — E119 Type 2 diabetes mellitus without complications: Secondary | ICD-10-CM | POA: Diagnosis not present

## 2019-02-04 DIAGNOSIS — I10 Essential (primary) hypertension: Secondary | ICD-10-CM | POA: Diagnosis not present

## 2019-02-04 DIAGNOSIS — E1142 Type 2 diabetes mellitus with diabetic polyneuropathy: Secondary | ICD-10-CM | POA: Diagnosis not present

## 2019-02-04 DIAGNOSIS — E782 Mixed hyperlipidemia: Secondary | ICD-10-CM | POA: Diagnosis not present

## 2019-02-05 ENCOUNTER — Other Ambulatory Visit: Admission: RE | Admit: 2019-02-05 | Payer: Medicare Other | Source: Ambulatory Visit

## 2019-02-05 NOTE — Discharge Instructions (Signed)

## 2019-02-06 ENCOUNTER — Other Ambulatory Visit: Payer: Self-pay

## 2019-02-06 ENCOUNTER — Other Ambulatory Visit
Admission: RE | Admit: 2019-02-06 | Discharge: 2019-02-06 | Disposition: A | Discharge status: Home / Self Care | Payer: Medicare Other | Source: Ambulatory Visit | Attending: Ophthalmology | Admitting: Ophthalmology

## 2019-02-06 DIAGNOSIS — Z1159 Encounter for screening for other viral diseases: Secondary | ICD-10-CM | POA: Diagnosis not present

## 2019-02-07 LAB — NOVEL CORONAVIRUS, NAA (HOSP ORDER, SEND-OUT TO REF LAB; TAT 18-24 HRS): SARS-CoV-2, NAA: NOT DETECTED

## 2019-02-10 ENCOUNTER — Ambulatory Visit
Admission: RE | Admit: 2019-02-10 | Discharge: 2019-02-10 | Disposition: A | Discharge status: Home / Self Care | Payer: Medicare Other | Attending: Ophthalmology | Admitting: Ophthalmology

## 2019-02-10 ENCOUNTER — Encounter
Admission: RE | Disposition: A | Discharge status: Home / Self Care | Payer: Self-pay | Source: Home / Self Care | Attending: Ophthalmology

## 2019-02-10 ENCOUNTER — Ambulatory Visit: Payer: Medicare Other | Admitting: Anesthesiology

## 2019-02-10 ENCOUNTER — Other Ambulatory Visit: Payer: Self-pay

## 2019-02-10 DIAGNOSIS — E119 Type 2 diabetes mellitus without complications: Secondary | ICD-10-CM | POA: Insufficient documentation

## 2019-02-10 DIAGNOSIS — I10 Essential (primary) hypertension: Secondary | ICD-10-CM | POA: Insufficient documentation

## 2019-02-10 DIAGNOSIS — H2512 Age-related nuclear cataract, left eye: Secondary | ICD-10-CM | POA: Insufficient documentation

## 2019-02-10 DIAGNOSIS — Z87891 Personal history of nicotine dependence: Secondary | ICD-10-CM | POA: Diagnosis not present

## 2019-02-10 DIAGNOSIS — H25812 Combined forms of age-related cataract, left eye: Secondary | ICD-10-CM | POA: Diagnosis not present

## 2019-02-10 HISTORY — DX: Essential (primary) hypertension: I10

## 2019-02-10 HISTORY — DX: Atherosclerosis of native arteries of extremities with intermittent claudication, bilateral legs: I70.213

## 2019-02-10 HISTORY — PX: CATARACT EXTRACTION W/PHACO: SHX586

## 2019-02-10 HISTORY — DX: Presence of dental prosthetic device (complete) (partial): Z97.2

## 2019-02-10 LAB — GLUCOSE, CAPILLARY
Glucose-Capillary: 78 mg/dL (ref 70–99)
Glucose-Capillary: 89 mg/dL (ref 70–99)

## 2019-02-10 SURGERY — PHACOEMULSIFICATION, CATARACT, WITH IOL INSERTION
Anesthesia: Monitor Anesthesia Care | Site: Eye | Laterality: Left

## 2019-02-10 MED ORDER — FENTANYL CITRATE (PF) 100 MCG/2ML IJ SOLN
INTRAMUSCULAR | Status: DC | PRN
  Administered 2019-02-10: 50 ug via INTRAVENOUS

## 2019-02-10 MED ORDER — SODIUM HYALURONATE 23 MG/ML IO SOLN
INTRAOCULAR | Status: DC | PRN
  Administered 2019-02-10: 0.6 mL via INTRAOCULAR

## 2019-02-10 MED ORDER — TETRACAINE HCL 0.5 % OP SOLN
1.0000 [drp] | OPHTHALMIC | Status: DC | PRN
Start: 2019-02-10 — End: 2019-02-10
  Administered 2019-02-10 (×3): 1 [drp] via OPHTHALMIC

## 2019-02-10 MED ORDER — LACTATED RINGERS IV SOLN: 10.0000 mL/h | INTRAVENOUS | Status: DC

## 2019-02-10 MED ORDER — LIDOCAINE HCL (PF) 2 % IJ SOLN
INTRAOCULAR | Status: DC | PRN
  Administered 2019-02-10: 09:00:00 1 mL via INTRAOCULAR

## 2019-02-10 MED ORDER — SODIUM HYALURONATE 10 MG/ML IO SOLN
INTRAOCULAR | Status: DC | PRN
  Administered 2019-02-10: 0.55 mL via INTRAOCULAR

## 2019-02-10 MED ORDER — MOXIFLOXACIN HCL 0.5 % OP SOLN
OPHTHALMIC | Status: DC | PRN
  Administered 2019-02-10: 0.2 mL via OPHTHALMIC

## 2019-02-10 MED ORDER — ARMC OPHTHALMIC DILATING DROPS
1.0000 "application " | OPHTHALMIC | Status: DC | PRN
  Administered 2019-02-10 (×3): 1 via OPHTHALMIC

## 2019-02-10 MED ORDER — MIDAZOLAM HCL 2 MG/2ML IJ SOLN
INTRAMUSCULAR | Status: DC | PRN
  Administered 2019-02-10: 1 mg via INTRAVENOUS

## 2019-02-10 MED ORDER — EPINEPHRINE PF 1 MG/ML IJ SOLN
INTRAOCULAR | Status: DC | PRN
  Administered 2019-02-10: 59 mL via OPHTHALMIC

## 2019-02-10 SURGICAL SUPPLY — 19 items
CANNULA ANT/CHMB 27G (MISCELLANEOUS) ×2 IMPLANT
CANNULA ANT/CHMB 27GA (MISCELLANEOUS) ×6 IMPLANT
DISSECTOR HYDRO NUCLEUS 50X22 (MISCELLANEOUS) ×3 IMPLANT
GLOVE SURG LX 7.5 STRW (GLOVE) ×2
GLOVE SURG LX STRL 7.5 STRW (GLOVE) ×1 IMPLANT
GLOVE SURG SYN 8.5  E (GLOVE) ×2
GLOVE SURG SYN 8.5 E (GLOVE) ×1 IMPLANT
GLOVE SURG SYN 8.5 PF PI (GLOVE) ×1 IMPLANT
GOWN STRL REUS W/ TWL LRG LVL3 (GOWN DISPOSABLE) ×2 IMPLANT
GOWN STRL REUS W/TWL LRG LVL3 (GOWN DISPOSABLE) ×4
LENS IOL TECNIS ITEC 20.5 (Intraocular Lens) ×2 IMPLANT
MARKER SKIN DUAL TIP RULER LAB (MISCELLANEOUS) ×3 IMPLANT
PACK DR. KING ARMS (PACKS) ×3 IMPLANT
PACK EYE AFTER SURG (MISCELLANEOUS) ×3 IMPLANT
PACK OPTHALMIC (MISCELLANEOUS) ×3 IMPLANT
SYR 3ML LL SCALE MARK (SYRINGE) ×3 IMPLANT
SYR TB 1ML LUER SLIP (SYRINGE) ×3 IMPLANT
WATER STERILE IRR 250ML POUR (IV SOLUTION) ×3 IMPLANT
WIPE NON LINTING 3.25X3.25 (MISCELLANEOUS) ×3 IMPLANT

## 2019-02-10 NOTE — Anesthesia Procedure Notes (Signed)
Procedure Name: MAC Performed by: Hula Tasso, Freeman, CRNA Pre-anesthesia Checklist: Patient identified, Emergency Drugs available, Suction available, Timeout performed and Patient being monitored Patient Re-evaluated:Patient Re-evaluated prior to induction Oxygen Delivery Method: Nasal cannula Placement Confirmation: positive ETCO2       

## 2019-02-10 NOTE — H&P (Signed)

## 2019-02-10 NOTE — Anesthesia Postprocedure Evaluation (Signed)
Anesthesia Post Note  Patient: [name]  Procedure(s) Performed: CATARACT EXTRACTION PHACO AND INTRAOCULAR LENS PLACEMENT (IOC)  LEFT DIABETIC (Left Eye)  Patient location during evaluation: PACU Anesthesia Type: MAC Level of consciousness: awake and alert Pain management: pain level controlled Vital Signs Assessment: post-procedure vital signs reviewed and stable Respiratory status: spontaneous breathing Cardiovascular status: stable Anesthetic complications: no    Jaci Standard, III,  Keyli Duross D

## 2019-02-10 NOTE — Anesthesia Preprocedure Evaluation (Signed)
Anesthesia Evaluation  Patient identified by MRN, date of birth, ID band Patient awake    Reviewed: Allergy & Precautions, H&P , NPO status , Patient's Chart, lab work & pertinent test results  Airway Mallampati: II  TM Distance: >3 FB Neck ROM: full    Dental no notable dental hx.    Pulmonary former smoker,    Pulmonary exam normal breath sounds clear to auscultation       Cardiovascular hypertension, On Medications  Rhythm:regular Rate:Normal + Systolic murmurs    Neuro/Psych    GI/Hepatic negative GI ROS, Neg liver ROS,   Endo/Other  diabetes, Well Controlled, Type 2  Renal/GU      Musculoskeletal   Abdominal   Peds  Hematology negative hematology ROS (+)   Anesthesia Other Findings   Reproductive/Obstetrics negative OB ROS                             Anesthesia Physical Anesthesia Plan  ASA: III  Anesthesia Plan: MAC   Post-op Pain Management:    Induction:   PONV Risk Score and Plan:   Airway Management Planned:   Additional Equipment:   Intra-op Plan:   Post-operative Plan:   Informed Consent: I have reviewed the patients History and Physical, chart, labs and discussed the procedure including the risks, benefits and alternatives for the proposed anesthesia with the patient or authorized representative who has indicated his/her understanding and acceptance.       Plan Discussed with:   Anesthesia Plan Comments:         Anesthesia Quick Evaluation

## 2019-02-10 NOTE — Transfer of Care (Signed)
Immediate Anesthesia Transfer of Care Note  Patient: [name]  Procedure(s) Performed: CATARACT EXTRACTION PHACO AND INTRAOCULAR LENS PLACEMENT (IOC)  LEFT DIABETIC (Left Eye)  Patient Location: PACU  Anesthesia Type: MAC  Level of Consciousness: awake, alert  and patient cooperative  Airway and Oxygen Therapy: Patient Spontanous Breathing and Patient connected to supplemental oxygen  Post-op Assessment: Post-op Vital signs reviewed, Patient's Cardiovascular Status Stable, Respiratory Function Stable, Patent Airway and No signs of Nausea or vomiting  Post-op Vital Signs: Reviewed and stable  Complications: No apparent anesthesia complications

## 2019-02-10 NOTE — Op Note (Signed)
OPERATIVE NOTE  [name] [id number] 02/10/2019   PREOPERATIVE DIAGNOSIS:  Nuclear sclerotic cataract left eye.  H25.12   POSTOPERATIVE DIAGNOSIS:    Nuclear sclerotic cataract left eye.     PROCEDURE:  Phacoemusification with posterior chamber intraocular lens placement of the left eye   LENS:   Implant Name Type Inv. Item Serial No. Manufacturer Lot No. LRB No. Used  LENS IOL DIOP 20.5 - [id number] Intraocular Lens LENS IOL DIOP 20.5 [id number] AMO  Left 1       PCB00 +20.5   ULTRASOUND TIME: 0 minutes 46 seconds.  CDE 6.88   SURGEON:  Willey Blade, MD, MPH   ANESTHESIA:  Topical with tetracaine drops augmented with 1% preservative-free intracameral lidocaine.  ESTIMATED BLOOD LOSS: <1 mL   COMPLICATIONS:  None.   DESCRIPTION OF PROCEDURE:  The patient was identified in the holding room and transported to the operating room and placed in the supine position under the operating microscope.  The left eye was identified as the operative eye and it was prepped and draped in the usual sterile ophthalmic fashion.   A 1.0 millimeter clear-corneal paracentesis was made at the 5:00 position. 0.5 ml of preservative-free 1% lidocaine with epinephrine was injected into the anterior chamber.  The anterior chamber was filled with Healon 5 viscoelastic.  A 2.4 millimeter keratome was used to make a near-clear corneal incision at the 2:00 position.  A curvilinear capsulorrhexis was made with a cystotome and capsulorrhexis forceps.  Balanced salt solution was used to hydrodissect and hydrodelineate the nucleus.   Phacoemulsification was then used in stop and chop fashion to remove the lens nucleus and epinucleus.  The remaining cortex was then removed using the irrigation and aspiration handpiece. Healon was then placed into the capsular bag to distend it for lens placement.  A lens was then injected into the capsular bag.  The remaining viscoelastic was aspirated.   Wounds were hydrated  with balanced salt solution.  The anterior chamber was inflated to a physiologic pressure with balanced salt solution.  Intracameral vigamox 0.1 mL undiltued was injected into the eye and a drop placed onto the ocular surface.  No wound leaks were noted.  The patient was taken to the recovery room in stable condition without complications of anesthesia or surgery  Willey Blade 02/10/2019, 9:08 AM

## 2019-02-11 ENCOUNTER — Encounter: Payer: Self-pay | Admitting: Ophthalmology

## 2019-02-12 DIAGNOSIS — E782 Mixed hyperlipidemia: Secondary | ICD-10-CM | POA: Diagnosis not present

## 2019-02-12 DIAGNOSIS — E1151 Type 2 diabetes mellitus with diabetic peripheral angiopathy without gangrene: Secondary | ICD-10-CM | POA: Diagnosis not present

## 2019-02-12 DIAGNOSIS — I10 Essential (primary) hypertension: Secondary | ICD-10-CM | POA: Diagnosis not present

## 2019-02-25 DIAGNOSIS — E113513 Type 2 diabetes mellitus with proliferative diabetic retinopathy with macular edema, bilateral: Secondary | ICD-10-CM | POA: Diagnosis not present

## 2019-04-01 DIAGNOSIS — I10 Essential (primary) hypertension: Secondary | ICD-10-CM | POA: Diagnosis not present

## 2019-04-01 DIAGNOSIS — E113513 Type 2 diabetes mellitus with proliferative diabetic retinopathy with macular edema, bilateral: Secondary | ICD-10-CM | POA: Diagnosis not present

## 2019-04-01 DIAGNOSIS — H2511 Age-related nuclear cataract, right eye: Secondary | ICD-10-CM | POA: Diagnosis not present

## 2019-04-04 IMAGING — DX DG FOOT 2V*L*
2 series · 2 of 2 positions shown · non-contrast
Comparison: 03/24/2018

CLINICAL DATA: Osteomyelitis.

EXAM:
LEFT FOOT - 2 VIEW

[foot ap]
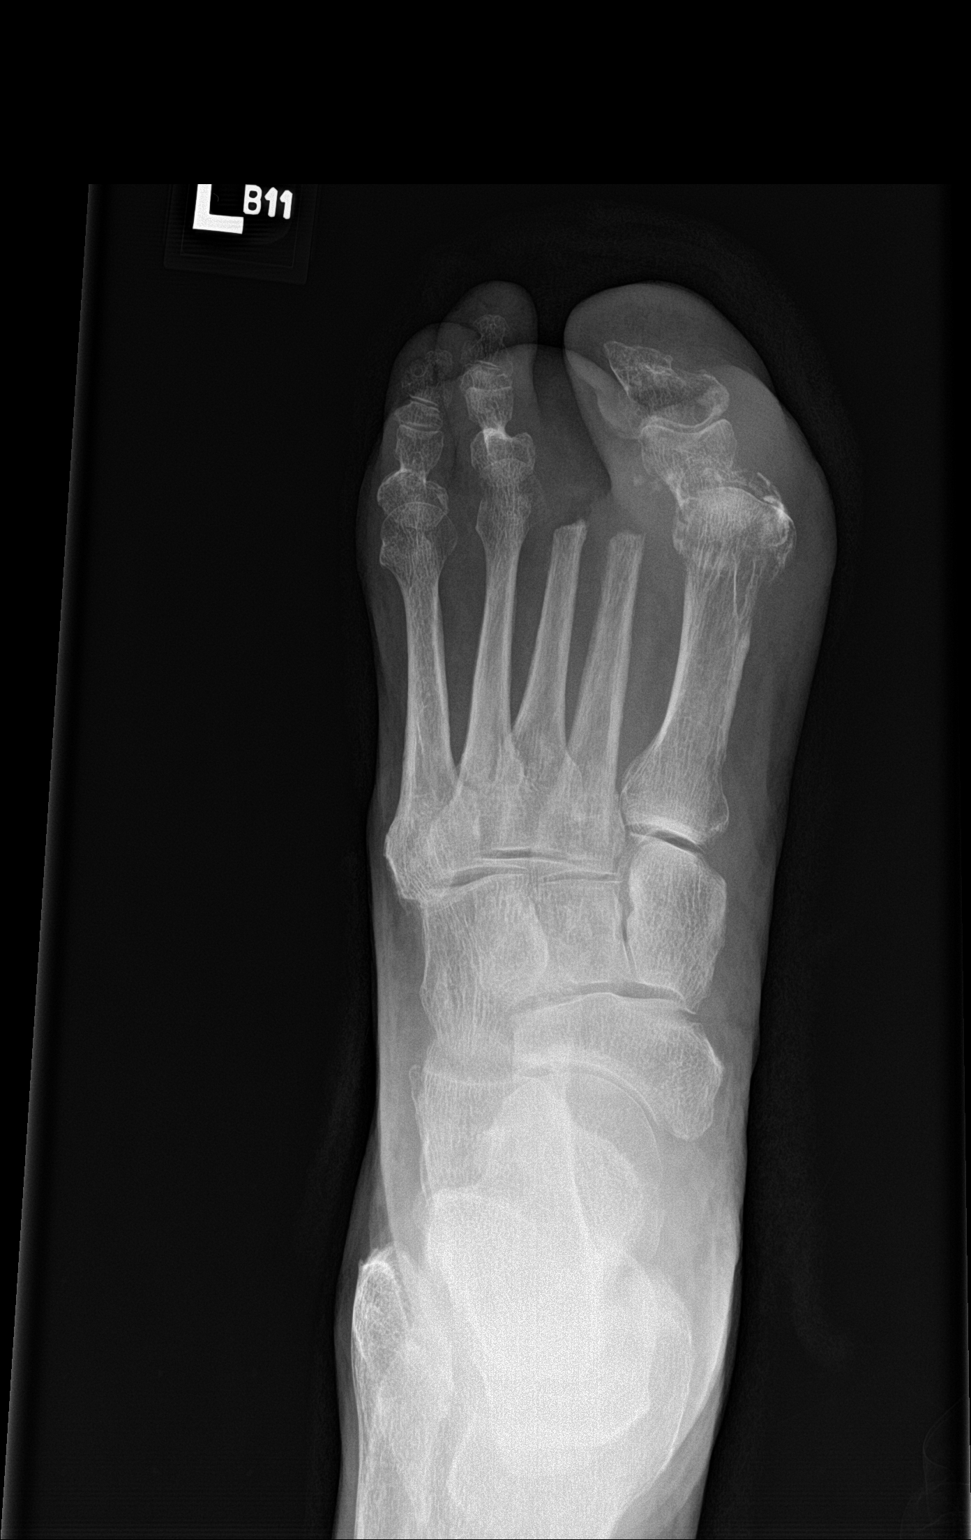

[foot lat]
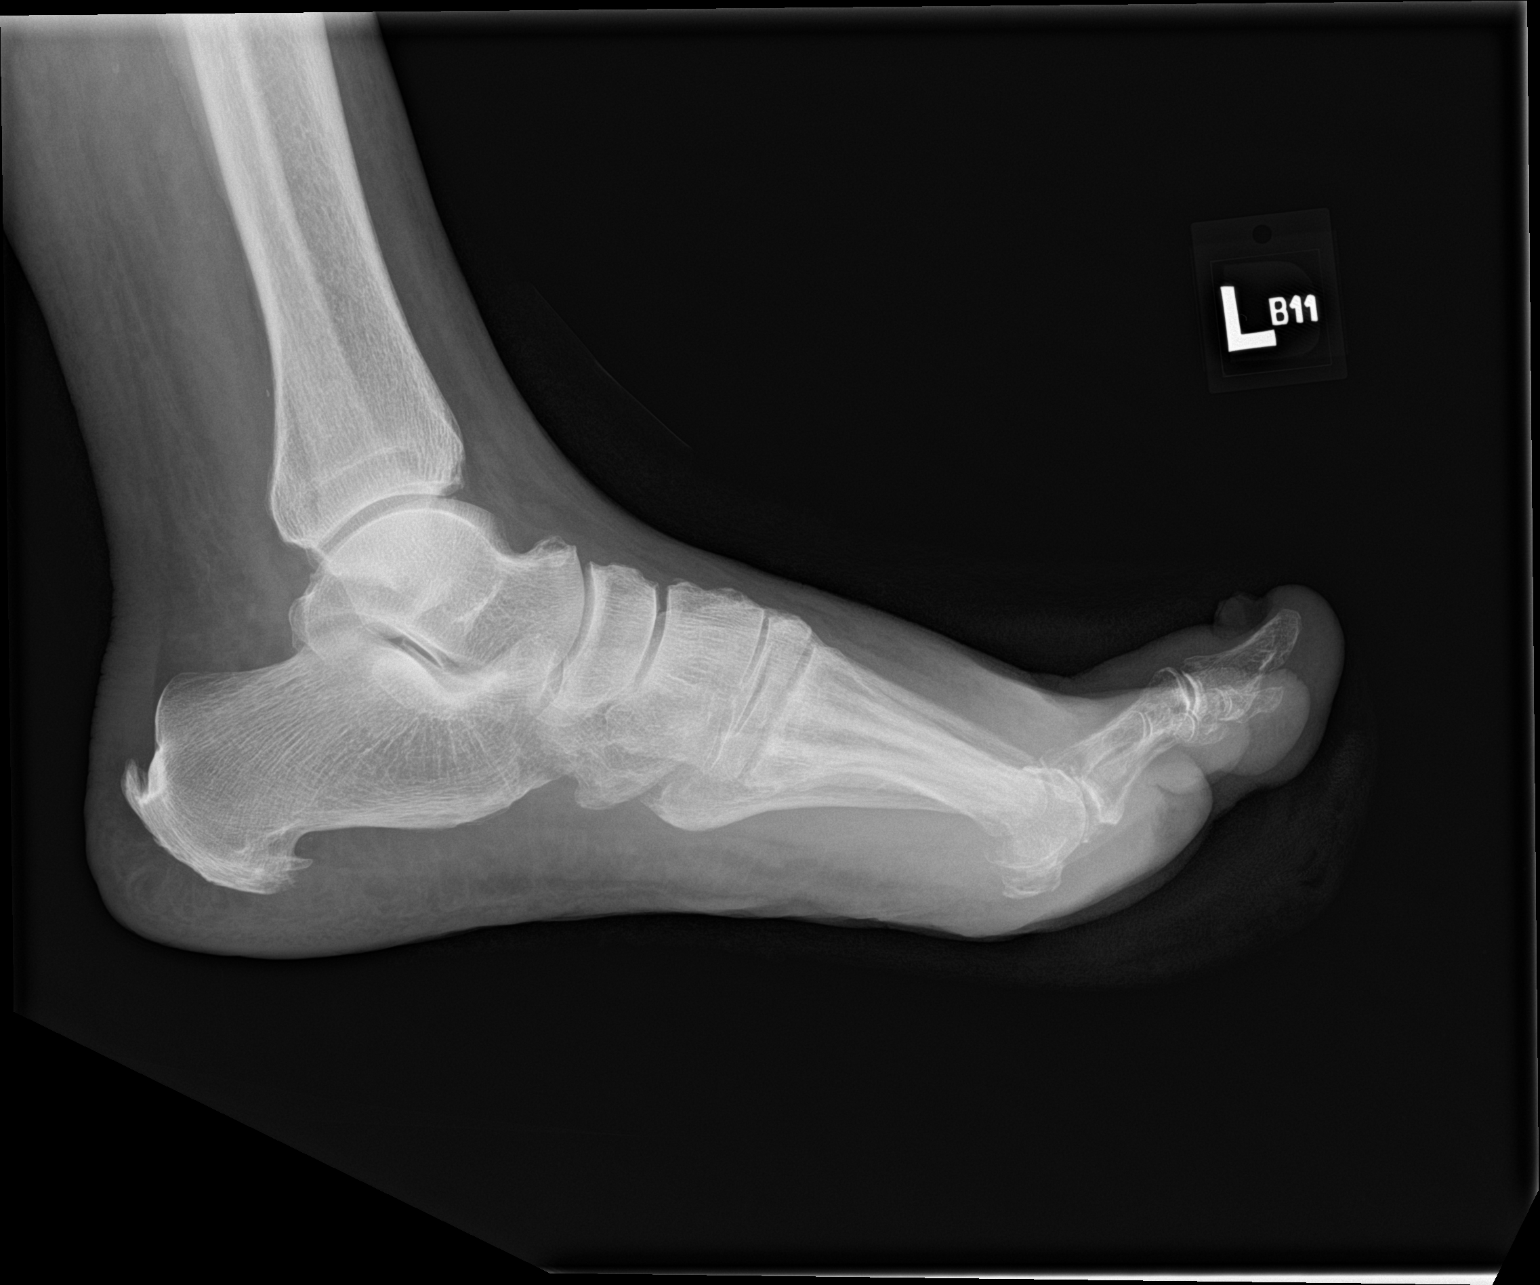

[2 of 2 positions shown; findings below may reference images not displayed]

FINDINGS: Prior amputation at the distal metatarsal level of the 2nd and 3rd
toes. Lucency and cortical disruption noted at the base of the left
great toe proximal phalanx which is new since prior study. Cannot
exclude fracture or osteomyelitis. No soft tissue gas or visible
soft tissue abnormality.
IMPRESSION: Transmetatarsal amputation of the 2nd and 3rd toes.

Lucency and cortical disruption at the base of the left great toe
proximal phalanx could reflect posttraumatic fractures or
osteomyelitis. Recommend clinical correlation.

## 2019-04-04 IMAGING — MR MR FOOT*L* W/O CM
5 series · 40 of 40 positions shown · non-contrast
Comparison: Same day radiographs of the foot.

CLINICAL DATA: Drainage and inflammation at the base of the great
toe. Status post amputation of the second and third toes 3 months
ago.

EXAM:
MRI OF THE LEFT FOOT WITHOUT CONTRAST
TECHNIQUE: Multiplanar, multisequence MR imaging of the left forefoot was
performed. No intravenous contrast was administered.

[Series 4: T1 · coronal · left · 3.0mm · 0.38mm/px · 11 of 45 slices shown (1 of 2)]
[im 1/45]
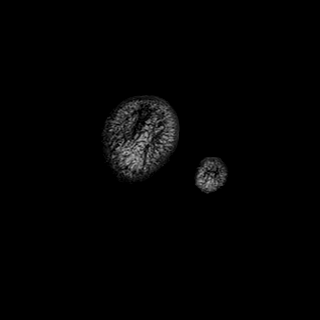
[im 5/45]
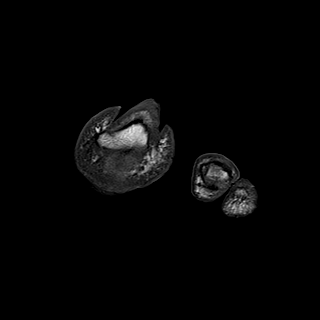
[im 9/45]
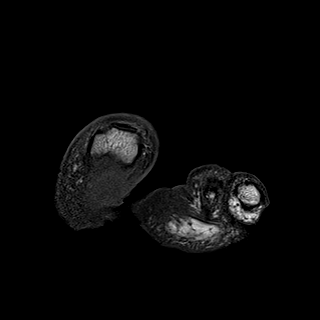
[im 14/45]
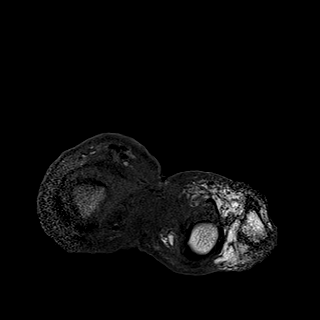
[im 18/45]
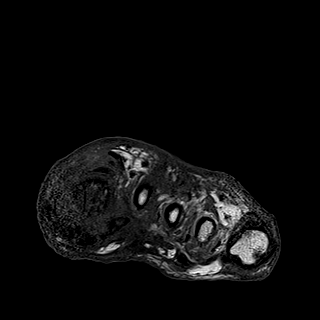
[im 23/45]
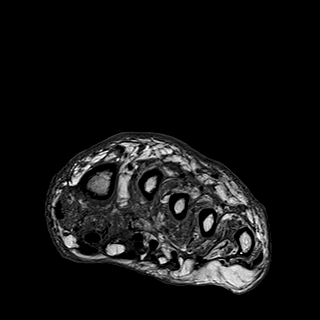
[im 27/45]
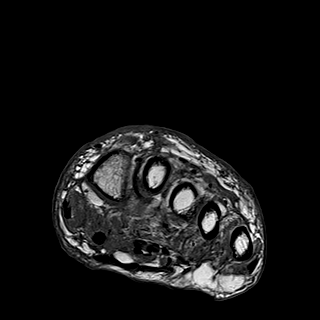
[im 31/45]
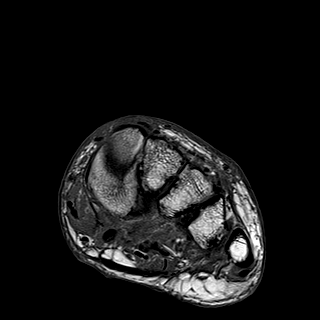
[im 36/45]
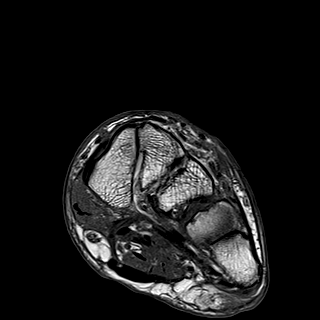
[im 40/45]
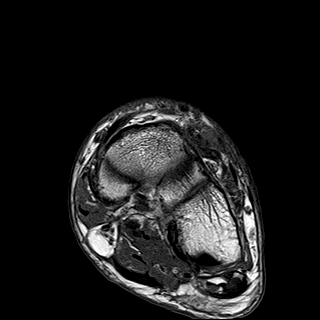
[im 45/45]
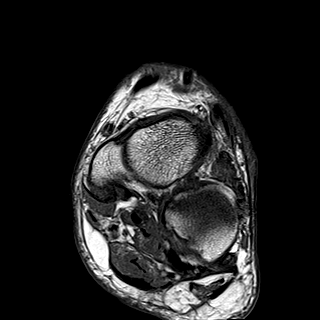

[Series 6: T2 · coronal · left · 3.0mm · 0.38mm/px · 12 of 45 slices shown (1 of 2)]
[im 1/45]
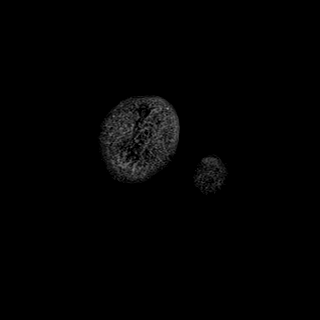
[im 5/45]
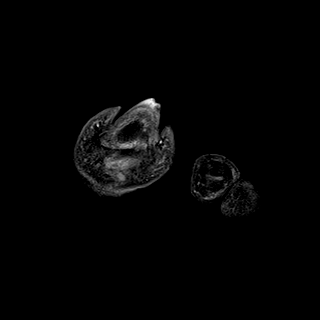
[im 9/45]
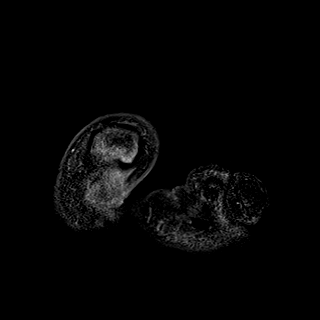
[im 13/45]
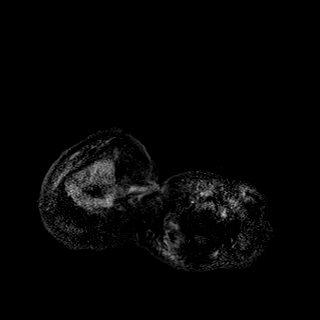
[im 17/45]
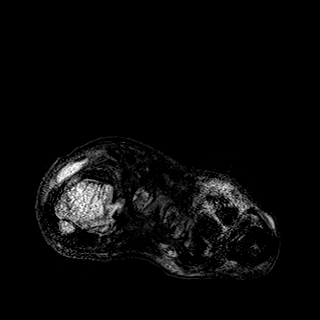
[im 21/45]
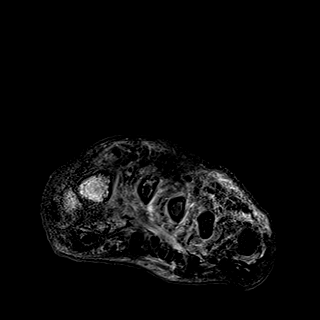
[im 25/45]
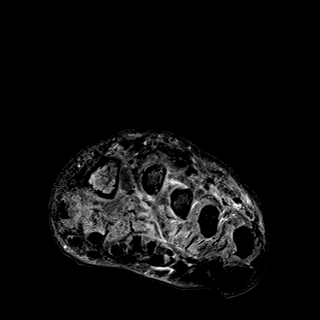
[im 29/45]
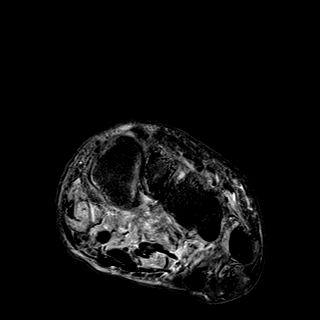
[im 33/45]
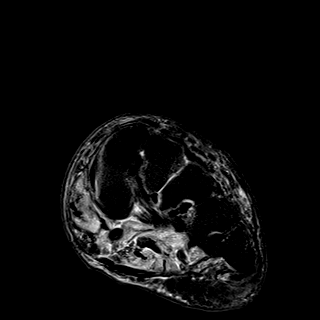
[im 37/45]
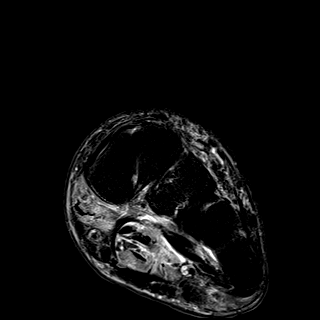
[im 41/45]
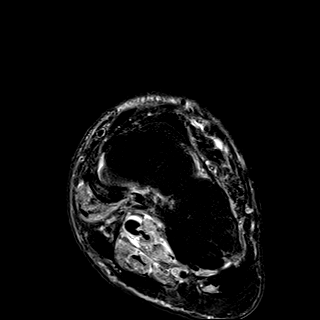
[im 45/45]
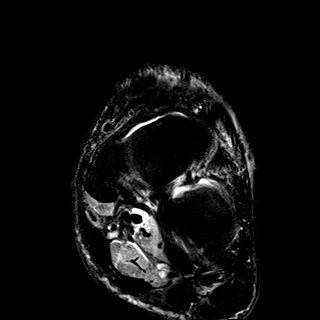

[Series 7: STIR · sagittal · left · 3.0mm · 0.62mm/px · 7 of 26 slices shown]
[im 1/26]
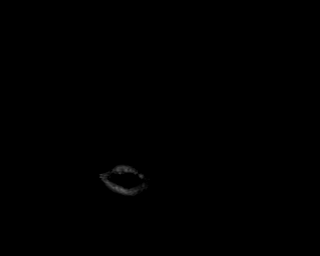
[im 5/26]
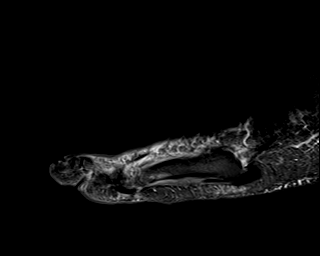
[im 9/26]
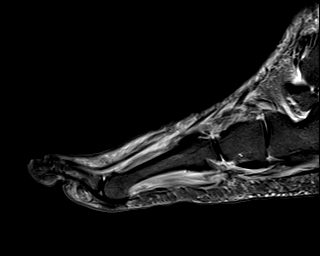
[im 13/26]
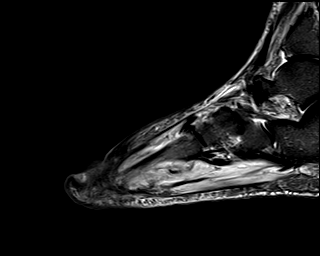
[im 17/26]
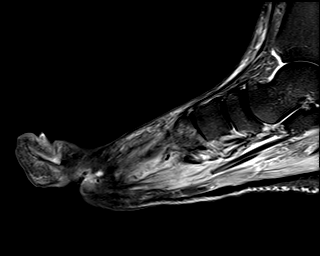
[im 21/26]
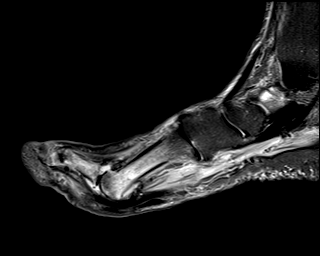
[im 26/26]
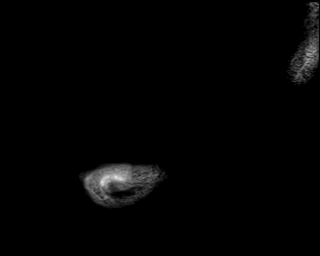

[Series 8: T1 · axial · left · 3.0mm · 0.70mm/px · z∈[-75,-13]mm · 5 of 20 slices shown (2 of 2)]
[im 1/20]
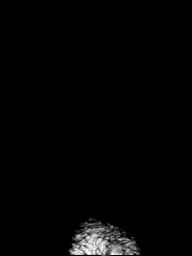
[im 5/20]
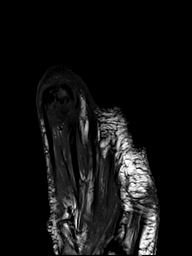
[im 10/20]
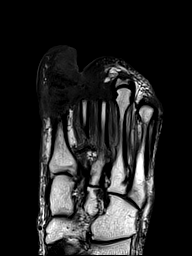
[im 15/20]
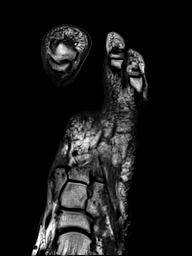
[im 20/20]
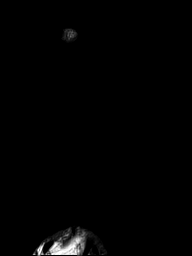

[Series 10: T2 · axial · left · 3.0mm · 0.70mm/px · z∈[-75,-13]mm · 5 of 20 slices shown (2 of 2)]
[im 1/20]
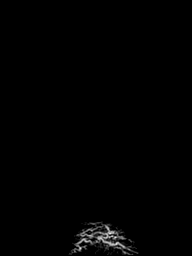
[im 5/20]
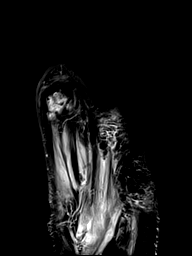
[im 10/20]
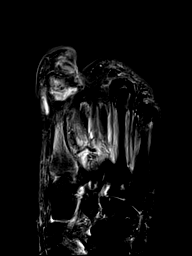
[im 15/20]
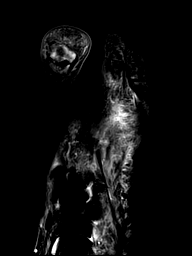
[im 20/20]
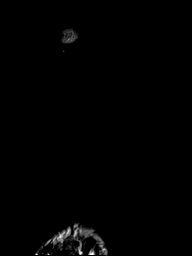

[40 of 40 positions shown; findings below may reference images not displayed]

FINDINGS: Bones/Joint/Cartilage

Abnormal marrow signal and edema involving the distal [REDACTED] of the
first metatarsal with fracture at the base of the first proximal
phalanx with marrow edema noted of the proximal and distal
phalanges. Given soft tissue inflammatory change and fluid adjacent
to the first MTP joint medially, findings are concerning for
osteomyelitis. Minimal reactive edema versus osteomyelitis involving
the surgical margins of the second and third metatarsals. The fourth
and fifth rays as well as included midfoot are unremarkable.

Ligaments

Noncontributory

Muscles and Tendons

Forefoot myositis without pyomyositis. The flexor and extensor
tendons demonstrate no tenosynovitis or unexpected disruption.

Soft tissues

Diffuse soft tissue edema of the forefoot consistent with
cellulitis. More focal curvilinear fluid collection as described
along the medial and dorsal aspect of the great toe. Suggestion of
small cutaneous ulceration along the plantar lateral base of the
great toe.
IMPRESSION: 1. Findings concerning for osteomyelitis of the first ray with
associated forefoot cellulitis, myositis and plantar cutaneous ulcer
at the base of the great toe.. Small curvilinear fluid collection as
described above at the level of the first MTP joint medially. Marrow
edema involves the distal [REDACTED] of the first metatarsal with
fracture at the base of the first proximal phalanx with marrow
signal abnormality along its entirety. Minimal marrow edema at the
base of the distal phalanx.
2. Status post transmetatarsal amputation of the second and third
toes with minimal marrow edema at the surgical margins likely
representing reactive change though the possibility of osteomyelitis
is not entirely excluded.

## 2019-04-08 DIAGNOSIS — E113513 Type 2 diabetes mellitus with proliferative diabetic retinopathy with macular edema, bilateral: Secondary | ICD-10-CM | POA: Diagnosis not present

## 2019-04-16 ENCOUNTER — Other Ambulatory Visit
Admission: RE | Admit: 2019-04-16 | Discharge: 2019-04-16 | Disposition: A | Discharge status: Home / Self Care | Payer: Medicare Other | Source: Ambulatory Visit | Attending: Ophthalmology | Admitting: Ophthalmology

## 2019-04-16 ENCOUNTER — Other Ambulatory Visit: Payer: Self-pay

## 2019-04-16 DIAGNOSIS — Z20828 Contact with and (suspected) exposure to other viral communicable diseases: Secondary | ICD-10-CM | POA: Insufficient documentation

## 2019-04-16 NOTE — Discharge Instructions (Signed)

## 2019-04-17 LAB — SARS CORONAVIRUS 2 (TAT 6-24 HRS): SARS Coronavirus 2: NEGATIVE

## 2019-04-20 ENCOUNTER — Ambulatory Visit: Payer: Medicare Other | Admitting: Anesthesiology

## 2019-04-20 ENCOUNTER — Encounter
Admission: RE | Disposition: A | Discharge status: Home / Self Care | Payer: Self-pay | Source: Home / Self Care | Attending: Ophthalmology

## 2019-04-20 ENCOUNTER — Ambulatory Visit
Admission: RE | Admit: 2019-04-20 | Discharge: 2019-04-20 | Disposition: A | Discharge status: Home / Self Care | Payer: Medicare Other | Attending: Ophthalmology | Admitting: Ophthalmology

## 2019-04-20 DIAGNOSIS — E1136 Type 2 diabetes mellitus with diabetic cataract: Secondary | ICD-10-CM | POA: Insufficient documentation

## 2019-04-20 DIAGNOSIS — H2511 Age-related nuclear cataract, right eye: Secondary | ICD-10-CM | POA: Insufficient documentation

## 2019-04-20 DIAGNOSIS — Z7984 Long term (current) use of oral hypoglycemic drugs: Secondary | ICD-10-CM | POA: Insufficient documentation

## 2019-04-20 DIAGNOSIS — Z7902 Long term (current) use of antithrombotics/antiplatelets: Secondary | ICD-10-CM | POA: Diagnosis not present

## 2019-04-20 DIAGNOSIS — I10 Essential (primary) hypertension: Secondary | ICD-10-CM | POA: Insufficient documentation

## 2019-04-20 DIAGNOSIS — Z87891 Personal history of nicotine dependence: Secondary | ICD-10-CM | POA: Insufficient documentation

## 2019-04-20 DIAGNOSIS — E1140 Type 2 diabetes mellitus with diabetic neuropathy, unspecified: Secondary | ICD-10-CM | POA: Diagnosis not present

## 2019-04-20 DIAGNOSIS — H25811 Combined forms of age-related cataract, right eye: Secondary | ICD-10-CM | POA: Diagnosis not present

## 2019-04-20 DIAGNOSIS — Z79899 Other long term (current) drug therapy: Secondary | ICD-10-CM | POA: Insufficient documentation

## 2019-04-20 HISTORY — PX: CATARACT EXTRACTION W/PHACO: SHX586

## 2019-04-20 LAB — GLUCOSE, CAPILLARY
Glucose-Capillary: 71 mg/dL (ref 70–99)
Glucose-Capillary: 82 mg/dL (ref 70–99)

## 2019-04-20 SURGERY — PHACOEMULSIFICATION, CATARACT, WITH IOL INSERTION
Anesthesia: Topical | Site: Eye | Laterality: Right

## 2019-04-20 MED ORDER — ONDANSETRON HCL 4 MG/2ML IJ SOLN: 4.0000 mg | Freq: Once | INTRAMUSCULAR | Status: DC | PRN

## 2019-04-20 MED ORDER — SODIUM HYALURONATE 10 MG/ML IO SOLN
INTRAOCULAR | Status: DC | PRN
  Administered 2019-04-20: 0.55 mL via INTRAOCULAR

## 2019-04-20 MED ORDER — EPINEPHRINE PF 1 MG/ML IJ SOLN
INTRAOCULAR | Status: DC | PRN
  Administered 2019-04-20: 104 mL via OPHTHALMIC

## 2019-04-20 MED ORDER — MIDAZOLAM HCL 2 MG/2ML IJ SOLN
INTRAMUSCULAR | Status: DC | PRN
  Administered 2019-04-20: 1 mg via INTRAVENOUS

## 2019-04-20 MED ORDER — MOXIFLOXACIN HCL 0.5 % OP SOLN
OPHTHALMIC | Status: DC | PRN
  Administered 2019-04-20: 0.2 mL via OPHTHALMIC

## 2019-04-20 MED ORDER — ARMC OPHTHALMIC DILATING DROPS
1.0000 "application " | OPHTHALMIC | Status: DC | PRN
  Administered 2019-04-20 (×3): 1 via OPHTHALMIC

## 2019-04-20 MED ORDER — FENTANYL CITRATE (PF) 100 MCG/2ML IJ SOLN
INTRAMUSCULAR | Status: DC | PRN
  Administered 2019-04-20: 50 ug via INTRAVENOUS

## 2019-04-20 MED ORDER — TETRACAINE HCL 0.5 % OP SOLN
1.0000 [drp] | OPHTHALMIC | Status: DC | PRN
  Administered 2019-04-20 (×3): 1 [drp] via OPHTHALMIC

## 2019-04-20 MED ORDER — SODIUM HYALURONATE 23 MG/ML IO SOLN
INTRAOCULAR | Status: DC | PRN
  Administered 2019-04-20: 0.6 mL via INTRAOCULAR

## 2019-04-20 MED ORDER — LIDOCAINE HCL (PF) 2 % IJ SOLN
INTRAOCULAR | Status: DC | PRN
  Administered 2019-04-20: 1 mL via INTRAOCULAR

## 2019-04-20 SURGICAL SUPPLY — 19 items
CANNULA ANT/CHMB 27G (MISCELLANEOUS) ×2 IMPLANT
CANNULA ANT/CHMB 27GA (MISCELLANEOUS) ×6 IMPLANT
DISSECTOR HYDRO NUCLEUS 50X22 (MISCELLANEOUS) ×3 IMPLANT
GLOVE SURG LX 7.5 STRW (GLOVE) ×2
GLOVE SURG LX STRL 7.5 STRW (GLOVE) ×1 IMPLANT
GLOVE SURG SYN 8.5  E (GLOVE) ×2
GLOVE SURG SYN 8.5 E (GLOVE) ×1 IMPLANT
GLOVE SURG SYN 8.5 PF PI (GLOVE) ×1 IMPLANT
GOWN STRL REUS W/ TWL LRG LVL3 (GOWN DISPOSABLE) ×2 IMPLANT
GOWN STRL REUS W/TWL LRG LVL3 (GOWN DISPOSABLE) ×4
LENS IOL TECNIS ITEC 19.0 (Intraocular Lens) ×2 IMPLANT
MARKER SKIN DUAL TIP RULER LAB (MISCELLANEOUS) ×3 IMPLANT
PACK DR. KING ARMS (PACKS) ×3 IMPLANT
PACK EYE AFTER SURG (MISCELLANEOUS) ×3 IMPLANT
PACK OPTHALMIC (MISCELLANEOUS) ×3 IMPLANT
SYR 3ML LL SCALE MARK (SYRINGE) ×3 IMPLANT
SYR TB 1ML LUER SLIP (SYRINGE) ×3 IMPLANT
WATER STERILE IRR 250ML POUR (IV SOLUTION) ×3 IMPLANT
WIPE NON LINTING 3.25X3.25 (MISCELLANEOUS) ×3 IMPLANT

## 2019-04-20 NOTE — Anesthesia Preprocedure Evaluation (Signed)
Anesthesia Evaluation  Patient identified by MRN, date of birth, ID band Patient awake    History of Anesthesia Complications Negative for: history of anesthetic complications  Airway Mallampati: I       Dental  (+) Partial Upper   Pulmonary neg pulmonary ROS, former smoker,    Pulmonary exam normal        Cardiovascular hypertension, Normal cardiovascular exam     Neuro/Psych negative neurological ROS     GI/Hepatic negative GI ROS, Neg liver ROS,   Endo/Other  diabetes, Well Controlled, Type 2  Renal/GU      Musculoskeletal negative musculoskeletal ROS (+)   Abdominal   Peds  Hematology negative hematology ROS (+)   Anesthesia Other Findings   Reproductive/Obstetrics                             Anesthesia Physical Anesthesia Plan  ASA: II  Anesthesia Plan:    Post-op Pain Management:    Induction: Intravenous  PONV Risk Score and Plan:   Airway Management Planned: Nasal Cannula  Additional Equipment: None  Intra-op Plan:   Post-operative Plan:   Informed Consent: I have reviewed the patients History and Physical, chart, labs and discussed the procedure including the risks, benefits and alternatives for the proposed anesthesia with the patient or authorized representative who has indicated his/her understanding and acceptance.       Plan Discussed with:   Anesthesia Plan Comments:         Anesthesia Quick Evaluation

## 2019-04-20 NOTE — Anesthesia Postprocedure Evaluation (Signed)
Anesthesia Post Note  Patient: [name]  Procedure(s) Performed: CATARACT EXTRACTION PHACO AND INTRAOCULAR LENS PLACEMENT (IOC)  RIGHT DIABETIC (Right Eye)  Patient location during evaluation: PACU Anesthesia Type: MAC Level of consciousness: awake Pain management: pain level controlled Vital Signs Assessment: post-procedure vital signs reviewed and stable Respiratory status: spontaneous breathing Cardiovascular status: blood pressure returned to baseline Postop Assessment: no headache, no backache and no apparent nausea or vomiting Anesthetic complications: no    Adele Barthel Janye Maynor

## 2019-04-20 NOTE — Op Note (Signed)
OPERATIVE NOTE  [name] [id number] 04/20/2019   PREOPERATIVE DIAGNOSIS:  Nuclear sclerotic cataract right eye.  H25.11   POSTOPERATIVE DIAGNOSIS:    Nuclear sclerotic cataract right eye.     PROCEDURE:  Phacoemusification with posterior chamber intraocular lens placement of the right eye   LENS:   Implant Name Type Inv. Item Serial No. Manufacturer Lot No. LRB No. Used Action  LENS IOL DIOP 19.0 - [id number] Intraocular Lens LENS IOL DIOP 19.0 [id number]  Right 1 Implanted       PCB00 +19.0   ULTRASOUND TIME: 0 minutes 58 seconds.  CDE 4.87   SURGEON:  Benay Pillow, MD, MPH  ANESTHESIOLOGIST: Anesthesiologist: Page, Adele Barthel, MD CRNA: Cameron Ali, CRNA   ANESTHESIA:  Topical with tetracaine drops augmented with 1% preservative-free intracameral lidocaine.  ESTIMATED BLOOD LOSS: less than 1 mL.   COMPLICATIONS:  None.   DESCRIPTION OF PROCEDURE:  The patient was identified in the holding room and transported to the operating room and placed in the supine position under the operating microscope.  The right eye was identified as the operative eye and it was prepped and draped in the usual sterile ophthalmic fashion.   A 1.0 millimeter clear-corneal paracentesis was made at the 10:30 position. 0.5 ml of preservative-free 1% lidocaine with epinephrine was injected into the anterior chamber.  The anterior chamber was filled with Healon 5 viscoelastic.  A 2.4 millimeter keratome was used to make a near-clear corneal incision at the 8:00 position.  A curvilinear capsulorrhexis was made with a cystotome and capsulorrhexis forceps.  Balanced salt solution was used to hydrodissect and hydrodelineate the nucleus.   Phacoemulsification was then used in stop and chop fashion to remove the lens nucleus and epinucleus.  The remaining cortex was then removed using the irrigation and aspiration handpiece. Healon was then placed into the capsular bag to distend it for lens placement.   A lens was then injected into the capsular bag.  The remaining viscoelastic was aspirated.   Wounds were hydrated with balanced salt solution.  The anterior chamber was inflated to a physiologic pressure with balanced salt solution.   Intracameral vigamox 0.1 mL undiluted was injected into the eye and a drop placed onto the ocular surface.  No wound leaks were noted.  The patient was taken to the recovery room in stable condition without complications of anesthesia or surgery  Benay Pillow 04/20/2019, 8:36 AM

## 2019-04-20 NOTE — Transfer of Care (Signed)
Immediate Anesthesia Transfer of Care Note  Patient: [name]  Procedure(s) Performed: CATARACT EXTRACTION PHACO AND INTRAOCULAR LENS PLACEMENT (IOC)  RIGHT DIABETIC (Right Eye)  Patient Location: PACU  Anesthesia Type: No value filed.  Level of Consciousness: awake, alert  and patient cooperative  Airway and Oxygen Therapy: Patient Spontanous Breathing and Patient connected to supplemental oxygen  Post-op Assessment: Post-op Vital signs reviewed, Patient's Cardiovascular Status Stable, Respiratory Function Stable, Patent Airway and No signs of Nausea or vomiting  Post-op Vital Signs: Reviewed and stable  Complications: No apparent anesthesia complications

## 2019-04-20 NOTE — Anesthesia Procedure Notes (Signed)
Procedure Name: MAC Date/Time: 04/20/2019 8:11 AM Performed by: Cameron Ali, CRNA Pre-anesthesia Checklist: Patient identified, Emergency Drugs available, Suction available, Timeout performed and Patient being monitored Patient Re-evaluated:Patient Re-evaluated prior to induction Oxygen Delivery Method: Nasal cannula Placement Confirmation: positive ETCO2

## 2019-04-20 NOTE — H&P (Signed)

## 2019-04-21 ENCOUNTER — Encounter: Payer: Self-pay | Admitting: Ophthalmology

## 2019-05-07 ENCOUNTER — Encounter (INDEPENDENT_AMBULATORY_CARE_PROVIDER_SITE_OTHER): Payer: Medicare Other

## 2019-05-07 ENCOUNTER — Ambulatory Visit (INDEPENDENT_AMBULATORY_CARE_PROVIDER_SITE_OTHER): Payer: Medicare Other | Admitting: Vascular Surgery

## 2019-08-24 DIAGNOSIS — I10 Essential (primary) hypertension: Secondary | ICD-10-CM | POA: Diagnosis not present

## 2019-08-24 DIAGNOSIS — E782 Mixed hyperlipidemia: Secondary | ICD-10-CM | POA: Diagnosis not present

## 2019-08-24 DIAGNOSIS — E1151 Type 2 diabetes mellitus with diabetic peripheral angiopathy without gangrene: Secondary | ICD-10-CM | POA: Diagnosis not present

## 2019-08-31 DIAGNOSIS — E782 Mixed hyperlipidemia: Secondary | ICD-10-CM | POA: Diagnosis not present

## 2019-08-31 DIAGNOSIS — I10 Essential (primary) hypertension: Secondary | ICD-10-CM | POA: Diagnosis not present

## 2019-08-31 DIAGNOSIS — Z7902 Long term (current) use of antithrombotics/antiplatelets: Secondary | ICD-10-CM | POA: Diagnosis not present

## 2019-08-31 DIAGNOSIS — E1151 Type 2 diabetes mellitus with diabetic peripheral angiopathy without gangrene: Secondary | ICD-10-CM | POA: Diagnosis not present

## 2019-08-31 DIAGNOSIS — Z87891 Personal history of nicotine dependence: Secondary | ICD-10-CM | POA: Diagnosis not present

## 2020-03-24 DIAGNOSIS — E782 Mixed hyperlipidemia: Secondary | ICD-10-CM | POA: Diagnosis not present

## 2020-03-24 DIAGNOSIS — I10 Essential (primary) hypertension: Secondary | ICD-10-CM | POA: Diagnosis not present

## 2020-03-24 DIAGNOSIS — E1151 Type 2 diabetes mellitus with diabetic peripheral angiopathy without gangrene: Secondary | ICD-10-CM | POA: Diagnosis not present

## 2020-03-28 DIAGNOSIS — E782 Mixed hyperlipidemia: Secondary | ICD-10-CM | POA: Diagnosis not present

## 2020-03-28 DIAGNOSIS — Z0000 Encounter for general adult medical examination without abnormal findings: Secondary | ICD-10-CM | POA: Diagnosis not present

## 2020-03-28 DIAGNOSIS — Z89422 Acquired absence of other left toe(s): Secondary | ICD-10-CM | POA: Diagnosis not present

## 2020-03-28 DIAGNOSIS — I10 Essential (primary) hypertension: Secondary | ICD-10-CM | POA: Diagnosis not present

## 2020-03-28 DIAGNOSIS — E1151 Type 2 diabetes mellitus with diabetic peripheral angiopathy without gangrene: Secondary | ICD-10-CM | POA: Diagnosis not present

## 2022-05-18 DIAGNOSIS — Z5320 Procedure and treatment not carried out because of patient's decision for unspecified reasons: Secondary | ICD-10-CM | POA: Insufficient documentation

## 2022-07-26 ENCOUNTER — Other Ambulatory Visit: Payer: Self-pay | Admitting: Family Medicine

## 2022-07-26 DIAGNOSIS — R42 Dizziness and giddiness: Secondary | ICD-10-CM

## 2022-08-10 ENCOUNTER — Ambulatory Visit: Payer: Medicare Other

## 2022-08-13 ENCOUNTER — Other Ambulatory Visit: Payer: Self-pay | Admitting: Student

## 2022-08-13 DIAGNOSIS — R079 Chest pain, unspecified: Secondary | ICD-10-CM

## 2022-08-22 ENCOUNTER — Ambulatory Visit
Admission: RE | Admit: 2022-08-22 | Discharge: 2022-08-22 | Disposition: A | Discharge status: Home / Self Care | Payer: Medicare Other | Source: Ambulatory Visit | Attending: Family Medicine | Admitting: Family Medicine

## 2022-08-22 DIAGNOSIS — R42 Dizziness and giddiness: Secondary | ICD-10-CM | POA: Diagnosis present

## 2022-08-22 DIAGNOSIS — I779 Disorder of arteries and arterioles, unspecified: Secondary | ICD-10-CM

## 2022-08-22 HISTORY — DX: Disorder of arteries and arterioles, unspecified: I77.9

## 2022-08-27 DIAGNOSIS — I350 Nonrheumatic aortic (valve) stenosis: Secondary | ICD-10-CM

## 2022-08-27 DIAGNOSIS — I77810 Thoracic aortic ectasia: Secondary | ICD-10-CM

## 2022-08-27 HISTORY — DX: Thoracic aortic ectasia: I77.810

## 2022-08-27 HISTORY — DX: Nonrheumatic aortic (valve) stenosis: I35.0

## 2022-09-06 ENCOUNTER — Other Ambulatory Visit: Payer: Medicare Other

## 2022-09-18 ENCOUNTER — Telehealth (HOSPITAL_COMMUNITY): Payer: Self-pay | Admitting: *Deleted

## 2022-09-18 MED ORDER — METOPROLOL TARTRATE 25 MG PO TABS: ORAL_TABLET | ORAL | 0 refills | Status: DC

## 2022-09-18 NOTE — Telephone Encounter (Signed)
Reaching out to patient to offer assistance regarding upcoming cardiac imaging study; pt's wife answered phone and verbalizes understanding of appt date/time, parking situation and where to check in, pre-test NPO status and medications ordered, and verified current allergies; name and call back number provided for further questions should they arise  Gordy Clement RN Navigator Card[facility] [phone] office [phone] cell  Patient to take 25mg  metoprolol tartrate if HR is greater than 60bpm.

## 2022-09-20 ENCOUNTER — Ambulatory Visit
Admission: RE | Admit: 2022-09-20 | Discharge: 2022-09-20 | Disposition: A | Discharge status: Home / Self Care | Payer: Medicare Other | Source: Ambulatory Visit | Attending: Student | Admitting: Student

## 2022-09-20 DIAGNOSIS — R079 Chest pain, unspecified: Secondary | ICD-10-CM | POA: Diagnosis not present

## 2022-09-20 DIAGNOSIS — I2510 Atherosclerotic heart disease of native coronary artery without angina pectoris: Secondary | ICD-10-CM

## 2022-09-20 LAB — POCT I-STAT CREATININE: Creatinine, Ser: 1.2 mg/dL (ref 0.61–1.24)

## 2022-09-20 MED ORDER — IOHEXOL 350 MG/ML SOLN
75.0000 mL | Freq: Once | INTRAVENOUS | Status: AC | PRN
  Administered 2022-09-20: 75 mL via INTRAVENOUS

## 2022-09-20 MED ORDER — NITROGLYCERIN 0.4 MG SL SUBL
0.8000 mg | SUBLINGUAL_TABLET | Freq: Once | SUBLINGUAL | Status: AC
  Administered 2022-09-20: 0.8 mg via SUBLINGUAL

## 2022-09-20 MED ORDER — DILTIAZEM HCL 25 MG/5ML IV SOLN
5.0000 mg | Freq: Once | INTRAVENOUS | Status: AC
  Administered 2022-09-20: 5 mg via INTRAVENOUS

## 2022-09-20 NOTE — Progress Notes (Signed)
Patient tolerated CT well. Drank water after. Vital signs stable encourage to drink water throughout day.Reasons explained and verbalized understanding.  

## 2022-09-21 DIAGNOSIS — I2510 Atherosclerotic heart disease of native coronary artery without angina pectoris: Secondary | ICD-10-CM

## 2022-09-21 HISTORY — DX: Atherosclerotic heart disease of native coronary artery without angina pectoris: I25.10

## 2023-03-19 ENCOUNTER — Emergency Department: Payer: Medicare Other

## 2023-03-19 ENCOUNTER — Inpatient Hospital Stay
Admission: EM | Admit: 2023-03-19 | Discharge: 2023-03-21 | DRG: 253 | Disposition: A | Discharge status: Home / Self Care | Payer: Medicare Other | Source: Ambulatory Visit | Attending: Internal Medicine | Admitting: Internal Medicine

## 2023-03-19 ENCOUNTER — Other Ambulatory Visit: Payer: Self-pay

## 2023-03-19 DIAGNOSIS — Z833 Family history of diabetes mellitus: Secondary | ICD-10-CM

## 2023-03-19 DIAGNOSIS — I130 Hypertensive heart and chronic kidney disease with heart failure and stage 1 through stage 4 chronic kidney disease, or unspecified chronic kidney disease: Secondary | ICD-10-CM | POA: Diagnosis present

## 2023-03-19 DIAGNOSIS — I4510 Unspecified right bundle-branch block: Secondary | ICD-10-CM | POA: Diagnosis present

## 2023-03-19 DIAGNOSIS — N1831 Chronic kidney disease, stage 3a: Secondary | ICD-10-CM | POA: Diagnosis not present

## 2023-03-19 DIAGNOSIS — Z7902 Long term (current) use of antithrombotics/antiplatelets: Secondary | ICD-10-CM | POA: Diagnosis not present

## 2023-03-19 DIAGNOSIS — E1140 Type 2 diabetes mellitus with diabetic neuropathy, unspecified: Secondary | ICD-10-CM | POA: Diagnosis present

## 2023-03-19 DIAGNOSIS — E11628 Type 2 diabetes mellitus with other skin complications: Secondary | ICD-10-CM | POA: Diagnosis not present

## 2023-03-19 DIAGNOSIS — Z8249 Family history of ischemic heart disease and other diseases of the circulatory system: Secondary | ICD-10-CM | POA: Diagnosis not present

## 2023-03-19 DIAGNOSIS — I5032 Chronic diastolic (congestive) heart failure: Secondary | ICD-10-CM | POA: Diagnosis not present

## 2023-03-19 DIAGNOSIS — I2510 Atherosclerotic heart disease of native coronary artery without angina pectoris: Secondary | ICD-10-CM | POA: Diagnosis present

## 2023-03-19 DIAGNOSIS — I70261 Atherosclerosis of native arteries of extremities with gangrene, right leg: Secondary | ICD-10-CM | POA: Diagnosis not present

## 2023-03-19 DIAGNOSIS — L089 Local infection of the skin and subcutaneous tissue, unspecified: Secondary | ICD-10-CM | POA: Diagnosis not present

## 2023-03-19 DIAGNOSIS — I739 Peripheral vascular disease, unspecified: Secondary | ICD-10-CM | POA: Diagnosis not present

## 2023-03-19 DIAGNOSIS — I96 Gangrene, not elsewhere classified: Secondary | ICD-10-CM | POA: Diagnosis not present

## 2023-03-19 DIAGNOSIS — E1152 Type 2 diabetes mellitus with diabetic peripheral angiopathy with gangrene: Principal | ICD-10-CM | POA: Diagnosis present

## 2023-03-19 DIAGNOSIS — Z79899 Other long term (current) drug therapy: Secondary | ICD-10-CM | POA: Diagnosis not present

## 2023-03-19 DIAGNOSIS — E1122 Type 2 diabetes mellitus with diabetic chronic kidney disease: Secondary | ICD-10-CM | POA: Diagnosis present

## 2023-03-19 DIAGNOSIS — Z87891 Personal history of nicotine dependence: Secondary | ICD-10-CM | POA: Diagnosis not present

## 2023-03-19 DIAGNOSIS — Z7984 Long term (current) use of oral hypoglycemic drugs: Secondary | ICD-10-CM

## 2023-03-19 DIAGNOSIS — I701 Atherosclerosis of renal artery: Secondary | ICD-10-CM | POA: Diagnosis present

## 2023-03-19 DIAGNOSIS — Z9889 Other specified postprocedural states: Secondary | ICD-10-CM | POA: Diagnosis not present

## 2023-03-19 DIAGNOSIS — I10 Essential (primary) hypertension: Secondary | ICD-10-CM

## 2023-03-19 DIAGNOSIS — I700 Atherosclerosis of aorta: Secondary | ICD-10-CM | POA: Diagnosis not present

## 2023-03-19 DIAGNOSIS — Z89422 Acquired absence of other left toe(s): Secondary | ICD-10-CM

## 2023-03-19 DIAGNOSIS — Z887 Allergy status to serum and vaccine status: Secondary | ICD-10-CM | POA: Diagnosis not present

## 2023-03-19 LAB — PROTIME-INR
INR: 1.2 (ref 0.8–1.2)
Prothrombin Time: 15.9 seconds — ABNORMAL HIGH (ref 11.4–15.2)

## 2023-03-19 LAB — BRAIN NATRIURETIC PEPTIDE: B Natriuretic Peptide: 65.1 pg/mL (ref 0.0–100.0)

## 2023-03-19 LAB — COMPREHENSIVE METABOLIC PANEL
ALT: 54 U/L — ABNORMAL HIGH (ref 0–44)
AST: 37 U/L (ref 15–41)
Albumin: 4.2 g/dL (ref 3.5–5.0)
Alkaline Phosphatase: 80 U/L (ref 38–126)
Anion gap: 9 (ref 5–15)
BUN: 27 mg/dL — ABNORMAL HIGH (ref 8–23)
CO2: 23 mmol/L (ref 22–32)
Calcium: 9.4 mg/dL (ref 8.9–10.3)
Chloride: 102 mmol/L (ref 98–111)
Creatinine, Ser: 1.27 mg/dL — ABNORMAL HIGH (ref 0.61–1.24)
GFR, Estimated: 56 mL/min — ABNORMAL LOW (ref >60)
Glucose, Bld: 129 mg/dL — ABNORMAL HIGH (ref 70–99)
Potassium: 4.7 mmol/L (ref 3.5–5.1)
Sodium: 134 mmol/L — ABNORMAL LOW (ref 135–145)
Total Bilirubin: 0.6 mg/dL (ref 0.3–1.2)
Total Protein: 7.9 g/dL (ref 6.5–8.1)

## 2023-03-19 LAB — LACTIC ACID, PLASMA
Lactic Acid, Venous: 1.3 mmol/L (ref 0.5–1.9)
Lactic Acid, Venous: 1.3 mmol/L (ref 0.5–1.9)

## 2023-03-19 LAB — CBC WITH DIFFERENTIAL/PLATELET
Abs Immature Granulocytes: 0.03 10*3/uL (ref 0.00–0.07)
Basophils Absolute: 0.1 10*3/uL (ref 0.0–0.1)
Basophils Relative: 1 %
Eosinophils Absolute: 0.3 10*3/uL (ref 0.0–0.5)
Eosinophils Relative: 2 %
HCT: 37.9 % — ABNORMAL LOW (ref 39.0–52.0)
Hemoglobin: 12.3 g/dL — ABNORMAL LOW (ref 13.0–17.0)
Immature Granulocytes: 0 %
Lymphocytes Relative: 21 %
Lymphs Abs: 2.4 10*3/uL (ref 0.7–4.0)
MCH: 29.8 pg (ref 26.0–34.0)
MCHC: 32.5 g/dL (ref 30.0–36.0)
MCV: 91.8 fL (ref 80.0–100.0)
Monocytes Absolute: 0.9 10*3/uL (ref 0.1–1.0)
Monocytes Relative: 8 %
Neutro Abs: 7.5 10*3/uL (ref 1.7–7.7)
Neutrophils Relative %: 68 %
Platelets: 238 10*3/uL (ref 150–400)
RBC: 4.13 MIL/uL — ABNORMAL LOW (ref 4.22–5.81)
RDW: 12.3 % (ref 11.5–15.5)
WBC: 11.2 10*3/uL — ABNORMAL HIGH (ref 4.0–10.5)
nRBC: 0 % (ref 0.0–0.2)

## 2023-03-19 LAB — URINALYSIS, ROUTINE W REFLEX MICROSCOPIC
Bilirubin Urine: NEGATIVE
Glucose, UA: NEGATIVE mg/dL
Hgb urine dipstick: NEGATIVE
Ketones, ur: NEGATIVE mg/dL
Leukocytes,Ua: NEGATIVE
Nitrite: NEGATIVE
Protein, ur: NEGATIVE mg/dL
Specific Gravity, Urine: 1.009 (ref 1.005–1.030)
pH: 5 (ref 5.0–8.0)

## 2023-03-19 LAB — CBG MONITORING, ED: Glucose-Capillary: 100 mg/dL — ABNORMAL HIGH (ref 70–99)

## 2023-03-19 LAB — SEDIMENTATION RATE: Sed Rate: 58 mm/hr — ABNORMAL HIGH (ref 0–20)

## 2023-03-19 MED ORDER — VANCOMYCIN HCL 1750 MG/350ML IV SOLN
1750.0000 mg | Freq: Once | INTRAVENOUS | Status: AC
  Administered 2023-03-19: 1750 mg via INTRAVENOUS
  Filled 2023-03-19: qty 350

## 2023-03-19 MED ORDER — HYDRALAZINE HCL 20 MG/ML IJ SOLN: 5.0000 mg | INTRAMUSCULAR | Status: DC | PRN

## 2023-03-19 MED ORDER — INSULIN ASPART 100 UNIT/ML IJ SOLN: 0.0000 [IU] | Freq: Three times a day (TID) | INTRAMUSCULAR | Status: DC

## 2023-03-19 MED ORDER — VANCOMYCIN HCL 1250 MG/250ML IV SOLN
1250.0000 mg | INTRAVENOUS | Status: DC
  Filled 2023-03-19 (×2): qty 250

## 2023-03-19 MED ORDER — ACETAMINOPHEN 325 MG PO TABS: 650.0000 mg | ORAL_TABLET | Freq: Four times a day (QID) | ORAL | Status: DC | PRN

## 2023-03-19 MED ORDER — INSULIN ASPART 100 UNIT/ML IJ SOLN: 0.0000 [IU] | Freq: Every day | INTRAMUSCULAR | Status: DC

## 2023-03-19 MED ORDER — SODIUM CHLORIDE 0.9 % IV SOLN
2.0000 g | Freq: Once | INTRAVENOUS | Status: AC
  Administered 2023-03-19: 2 g via INTRAVENOUS
  Filled 2023-03-19: qty 12.5

## 2023-03-19 MED ORDER — METRONIDAZOLE 500 MG/100ML IV SOLN
500.0000 mg | Freq: Two times a day (BID) | INTRAVENOUS | Status: DC
  Administered 2023-03-19 – 2023-03-21 (×3): 500 mg via INTRAVENOUS
  Filled 2023-03-19 (×4): qty 100

## 2023-03-19 MED ORDER — SODIUM CHLORIDE 0.9 % IV SOLN
2.0000 g | INTRAVENOUS | Status: DC
  Administered 2023-03-20 – 2023-03-21 (×2): 2 g via INTRAVENOUS
  Filled 2023-03-19 (×2): qty 20

## 2023-03-19 MED ORDER — HEPARIN SODIUM (PORCINE) 5000 UNIT/ML IJ SOLN
5000.0000 [IU] | Freq: Three times a day (TID) | INTRAMUSCULAR | Status: DC
  Administered 2023-03-19 – 2023-03-21 (×5): 5000 [IU] via SUBCUTANEOUS
  Filled 2023-03-19 (×5): qty 1

## 2023-03-19 MED ORDER — SODIUM CHLORIDE 0.9 % IV BOLUS
1000.0000 mL | Freq: Once | INTRAVENOUS | Status: AC
  Administered 2023-03-19: 1000 mL via INTRAVENOUS

## 2023-03-19 MED ORDER — ONDANSETRON HCL 4 MG/2ML IJ SOLN: 4.0000 mg | Freq: Three times a day (TID) | INTRAMUSCULAR | Status: DC | PRN

## 2023-03-19 MED ORDER — OXYCODONE-ACETAMINOPHEN 5-325 MG PO TABS: 1.0000 | ORAL_TABLET | ORAL | Status: DC | PRN

## 2023-03-19 NOTE — Consult Note (Signed)
Pharmacy Antibiotic Note  [name] is a 82 y.o. male admitted on 03/19/2023 with  Diabetic foot infection .  Pharmacy has been consulted for vancomycin dosing.  Plan: Pt received vancomycin 1750 mg x 1 loading dose followed by vancomycin 1250 mg q24H. Predicted AUC 541. Goal 400-600. Scr slightly trending up, but not technically in AKI. Scr 1.27, Vd 0.72. IBW. Plan to obtain vancomycin level after 4th or 5th dose.   Continue ceftriaxone 2 g daily   Continue flagyl 500 mg BID.   Height: 5\' 8"  (172.7 cm) Weight: 79.4 kg (175 lb 0.7 oz) IBW/kg (Calculated) : 68.4  Temp (24hrs), Avg:97.9 F (36.6 C), Min:97.9 F (36.6 C), Max:97.9 F (36.6 C)  Recent Labs  Lab 03/19/23 1511 03/19/23 1518  WBC  --  11.2*  CREATININE  --  1.27*  LATICACIDVEN 1.3  --     Estimated Creatinine Clearance: 43.4 mL/min (A) (by C-G formula based on SCr of 1.27 mg/dL (H)).    Allergies  Allergen Reactions   Influenza Virus Vaccine Other (See Comments) and Nausea And Vomiting    Fever, chills, vomiting.     Antimicrobials this admission: /7/2 cefepime x 1  7/2 vancomycin >>  7/2 ceftriaxone >>  7/2 flagyl >>   Dose adjustments this admission: None  Microbiology results: 7/2 BCx: pending  Thank you for allowing pharmacy to be a part of this patient's care.  Ronnald Ramp, PharmD, BCPS 03/19/2023 5:00 PM

## 2023-03-19 NOTE — H&P (View-Only) (Signed)
Hospital Consult    Reason for Consult:  Ischemic Toe right lower extremity. Requesting Physician:  Dr Shannon Mumma MD MRN #:  [id number]  History of Present Illness: This is a 82 y.o. male past medical history significant for peripheral arterial disease on Plavix, who presents to the emergency department with concern for an ischemic toe. Patient first noted a wound to his second toe on his right foot last week that has progressively worsened over the past couple of days. Evaluated today at primary care physician office and sent to the emergency department given concern for an acute ischemic toe with surrounding cellulitis and possible need for amputation.   Patient has a history with vascular surgery and was last seen in 2020 by Dr Schnier. He was to follow up with bilateral lower extremity U/S with ABI's but never did. He presents to day with gangrene of his 3rd toe on his right foot. Positive odor. Wife at the bedside states it started last week. He denies any chest pain or shortness of breath today. Denies any dizziness, Nausea, vomiting or diarrhea. Denies any recent fevers or chills. Vital all remain stable.   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)    Diabetes mellitus without complication (HCC)    Hypertension    Wears dentures    Full upper and lower (loose)    Past Surgical History:  Procedure Laterality Date   AMPUTATION TOE Left 03/28/2018   Procedure: AMPUTATION TOE/ PARTIAL RAY RESECTION-LEFT 2ND AND 3RD;  Surgeon: Cline, Todd, DPM;  Location: [facility];  Service: Podiatry;  Laterality: Left;   CATARACT EXTRACTION W/PHACO Left 02/10/2019   Procedure: CATARACT EXTRACTION PHACO AND INTRAOCULAR LENS PLACEMENT (IOC)  LEFT DIABETIC;  Surgeon: King, Bradley Mark, MD;  Location: [facility];  Service: Ophthalmology;  Laterality: Left;  Diabetic - oral meds   CATARACT EXTRACTION W/PHACO Right 04/20/2019    Procedure: CATARACT EXTRACTION PHACO AND INTRAOCULAR LENS PLACEMENT (IOC)  RIGHT DIABETIC;  Surgeon: King, Bradley Mark, MD;  Location: [facility];  Service: Ophthalmology;  Laterality: Right;   HERNIA REPAIR     LOWER EXTREMITY ANGIOGRAPHY Left 03/26/2018   Procedure: Lower Extremity Angiography;  Surgeon: Schnier, Gregory G, MD;  Location: [facility];  Service: Cardiovascular;  Laterality: Left;   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   Procedure: TRANSMETATARSAL AMPUTATION;  Surgeon: Troxler, Matthew, DPM;  Location: [facility];  Service: Podiatry;  Laterality: Left;    Allergies  Allergen Reactions   Influenza Virus Vaccine Other (See Comments) and Nausea And Vomiting    Fever, chills, vomiting.     Prior to Admission medications   Medication Sig Start Date End Date Taking? Authorizing Provider  amLODipine (NORVASC) 2.5 MG tablet Take 2.5 mg by mouth daily. 03/14/18   [provider]  clopidogrel (PLAVIX) 75 MG tablet Take 75 mg by mouth daily.    [provider]  metFORMIN (GLUCOPHAGE) 500 MG tablet Take by mouth daily with breakfast.    [provider]  metoprolol tartrate (LOPRESSOR) 25 MG tablet Take tablet TWO hours prior to his cardiac CT scan. 09/18/22   Agbor-Etang, Brian, MD  Multiple Vitamin (MULTIVITAMIN) tablet Take 1 tablet by mouth daily.    [provider]  valsartan (DIOVAN) 80 MG tablet Take 80 mg by mouth daily.    [provider]    Social History   Socioeconomic History   Marital status: Married      Spouse name: Not on file   Number of children: Not on file   Years of education: Not on file   Highest education level: Not on file  Occupational History   Not on file  Tobacco Use   Smoking status: Former    Types: Cigarettes    Quit date: 1985    Years since quitting: 39.5   Smokeless tobacco: Never  Vaping Use   Vaping Use: Never used  Substance and Sexual Activity   Alcohol use: Never     Comment: rare - couple times/yr   Drug use: Never   Sexual activity: Never  Other Topics Concern   Not on file  Social History Narrative   Not on file   Social Determinants of Health   Financial Resource Strain: Not on file  Food Insecurity: Not on file  Transportation Needs: Not on file  Physical Activity: Not on file  Stress: Not on file  Social Connections: Not on file  Intimate Partner Violence: Not on file     Family History  Problem Relation Age of Onset   Diabetes Mother    Cancer Father    Hypertension Maternal Grandfather    Heart attack Maternal Grandfather     ROS: Otherwise negative unless mentioned in HPI  Physical Examination  Vitals:   03/19/23 1429 03/19/23 1545  BP: (!) 149/65 (!) 177/84  Pulse: 67 (!) 59  Resp: 18 13  Temp: 97.9 F (36.6 C)   SpO2: 100% 99%   Body mass index is 26.62 kg/m.  General:  WDWN in NAD Gait: Not observed HENT: WNL, normocephalic Pulmonary: normal non-labored breathing, without Rales, rhonchi,  wheezing Cardiac: regular, without  Murmurs, rubs or gallops; without carotid bruits Abdomen: Positive bowel sounds,  soft, NT/ND, no masses Skin: without rashes Vascular Exam/Pulses: Palpable pulses throughout but very weak in right lower extremity both popliteal, PT/DP.  Extremities: with ischemic changes, with Gangrene , with cellulitis; with open wounds;  Musculoskeletal: no muscle wasting or atrophy  Neurologic: A&O X 3;  No focal weakness or paresthesias are detected; speech is fluent/normal Psychiatric:  The pt has Normal affect. Lymph:  Unremarkable  CBC    Component Value Date/Time   WBC 11.2 (H) 03/19/2023 1518   RBC 4.13 (L) 03/19/2023 1518   HGB 12.3 (L) 03/19/2023 1518   HCT 37.9 (L) 03/19/2023 1518   PLT 238 03/19/2023 1518   MCV 91.8 03/19/2023 1518   MCH 29.8 03/19/2023 1518   MCHC 32.5 03/19/2023 1518   RDW 12.3 03/19/2023 1518   LYMPHSABS 2.4 03/19/2023 1518   MONOABS 0.9 03/19/2023 1518    EOSABS 0.3 03/19/2023 1518   BASOSABS 0.1 03/19/2023 1518    BMET    Component Value Date/Time   NA 136 07/21/2018 0418   K 4.6 07/21/2018 0418   CL 102 07/21/2018 0418   CO2 26 07/21/2018 0418   GLUCOSE 100 (H) 07/21/2018 0418   BUN 21 07/21/2018 0418   CREATININE 1.20 09/20/2022 1537   CALCIUM 9.0 07/21/2018 0418   GFRNONAA >60 07/30/2018 1045   GFRAA >60 07/30/2018 1045    COAGS: Lab Results  Component Value Date   INR 1.2 03/19/2023   INR 1.25 07/18/2018     Non-Invasive Vascular Imaging:   None ordered at this time.   Statin:  No. Beta Blocker:  Yes.   Aspirin:  No. ACEI:  No. ARB:  Yes.   CCB use:  Yes Other antiplatelets/anticoagulants:  Yes.   Plavix 75 mg   daily.    ASSESSMENT/PLAN: This is a 82 y.o. male with a history of PAD last seen by vascular surgery in 2020. Patient presents to [facility] from PCP office for concern of acute ischemia to right lower extremity toes with possible need for amputation.   PLAN: Vascular Surgery plans on taking the patient to the vascular lab tomorrow 03/20/2023 for a right lower extremity angiogram with possible intervention. I discussed in detail with the patient and wife at the bedside, the procedure, benefits, risks and complications. He verbalizes his understanding. I answered all the patients questions. He would like to proceed as soon as possible. Patient will be made NPO after midnight for procedure tomorrow.    -I discussed the plan in detail with Dr Greg Schnier MD and he agrees with the plan.    Tej Murdaugh [facility] 03/19/2023 3:55 PM  

## 2023-03-19 NOTE — Consult Note (Signed)
PHARMACY -  BRIEF ANTIBIOTIC NOTE   Pharmacy has received consult(s) for Suspected diabetic foot infection.  from an ED provider.  The patient's profile has been reviewed for ht/wt/allergies/indication/available labs.    One time order(s) placed for cefepime and vancomycin  Further antibiotics/pharmacy consults should be ordered by admitting physician if indicated.                       Thank you, Ronnald Ramp 03/19/2023  3:34 PM

## 2023-03-19 NOTE — ED Provider Notes (Addendum)
[facility] Provider Note    Event Date/Time   First MD Initiated Contact with Patient 03/19/23 1456     (approximate)   History   Cellulitis   HPI  [name] is a 82 y.o. male past medical history significant for peripheral arterial disease on Plavix, who presents to the emergency department with concern for an ischemic toe.  Patient first noted a wound to his second toe on his right foot last week that has progressively worsened over the past couple of days.  Evaluated today at primary care physician office and sent to the emergency department given concern for an acute ischemic toe with surrounding cellulitis and possible need for amputation.  History of vascular surgery in the past but did not follow-up.  Does have a history of diabetes with a history of neuropathy however glucose has been well-controlled in the 80s and 90s and he is no longer on any diabetic medications.  Denies any fever or chills.  Denies any nausea or vomiting.  No recent antibiotic use.     Physical Exam   Triage Vital Signs: ED Triage Vitals  Enc Vitals Group     BP 03/19/23 1429 (!) 149/65     Pulse Rate 03/19/23 1429 67     Resp 03/19/23 1429 18     Temp 03/19/23 1429 97.9 F (36.6 C)     Temp Source 03/19/23 1429 Oral     SpO2 03/19/23 1429 100 %     Weight 03/19/23 1430 175 lb 0.7 oz (79.4 kg)     Height 03/19/23 1430 5\' 8"  (1.727 m)     Head Circumference --      Peak Flow --      Pain Score 03/19/23 1429 5     Pain Loc --      Pain Edu? --      Excl. in GC? --     Most recent vital signs: Vitals:   03/19/23 1429 03/19/23 1545  BP: (!) 149/65 (!) 177/84  Pulse: 67 (!) 59  Resp: 18 13  Temp: 97.9 F (36.6 C)   SpO2: 100% 99%    Physical Exam Constitutional:      Appearance: He is well-developed.  HENT:     Head: Atraumatic.  Eyes:     Conjunctiva/sclera: Conjunctivae normal.  Cardiovascular:     Rate and Rhythm: Regular rhythm.  Pulmonary:      Effort: No respiratory distress.  Musculoskeletal:     Cervical back: Normal range of motion.  Skin:    General: Skin is warm.     Comments: PD dopplerable pulse to the right foot  Neurological:     Mental Status: He is alert. Mental status is at baseline.        IMPRESSION / MDM / ASSESSMENT AND PLAN / ED COURSE  I reviewed the triage vital signs and the nursing notes.  Differential diagnosis with concern for ischemic toe, osteomyelitis, cellulitis.   EKG  I, Corena Herter, the attending physician, personally viewed and interpreted this ECG.   Rate: Normal  Rhythm: Normal sinus  Axis: Normal  Intervals: Normal  ST&T Change: None  No tachycardic or bradycardic dysrhythmias while on cardiac telemetry.  RADIOLOGY I independently reviewed imaging, my interpretation of imaging: Noted diffuse edema and destruction of the fourth digit was just not the digit that is currently ischemic.  No obvious findings of osteomyelitis to the second toe.  Diffuse soft tissue edema.  No air.  LABS (all labs ordered are listed, but only abnormal results are displayed) Labs interpreted as -    Labs Reviewed  COMPREHENSIVE METABOLIC PANEL - Abnormal; Notable for the following components:      Result Value   Sodium 134 (*)    Glucose, Bld 129 (*)    BUN 27 (*)    Creatinine, Ser 1.27 (*)    ALT 54 (*)    GFR, Estimated 56 (*)    All other components within normal limits  CBC WITH DIFFERENTIAL/PLATELET - Abnormal; Notable for the following components:   WBC 11.2 (*)    RBC 4.13 (*)    Hemoglobin 12.3 (*)    HCT 37.9 (*)    All other components within normal limits  PROTIME-INR - Abnormal; Notable for the following components:   Prothrombin Time 15.9 (*)    All other components within normal limits  CULTURE, BLOOD (ROUTINE X 2)  CULTURE, BLOOD (ROUTINE X 2)  LACTIC ACID, PLASMA  LACTIC ACID, PLASMA  URINALYSIS, ROUTINE W REFLEX MICROSCOPIC     MDM  Mild leukocytos.  No  significant hyperglycemia.  No significant metabolic abnormalities.  Concern for ischemic right toe with possible osteomyelitis. Consulted vascular surgery and discussed with nurse practitioner on-call with Dr. Gilda Crease, will start on antibiotics and recommended admission to the hospitalist.  Started on vancomycin and cefepime.  Consulted hospitalist for admission.     PROCEDURES:  Critical Care performed: No  Procedures  Patient's presentation is most consistent with acute presentation with potential threat to life or bodily function.   MEDICATIONS ORDERED IN ED: Medications  ceFEPIme (MAXIPIME) 2 g in sodium chloride 0.9 % 100 mL IVPB (2 g Intravenous New Bag/Given 03/19/23 1554)  vancomycin (VANCOREADY) IVPB 1750 mg/350 mL (has no administration in time range)  sodium chloride 0.9 % bolus 1,000 mL (1,000 mLs Intravenous New Bag/Given 03/19/23 1552)    FINAL CLINICAL IMPRESSION(S) / ED DIAGNOSES   Final diagnoses:  Ischemic necrosis of toe (HCC)     Rx / DC Orders   ED Discharge Orders     None        Note:  This document was prepared using Dragon voice recognition software and may include unintentional dictation errors.   Corena Herter, MD 03/19/23 1545    Corena Herter, MD 03/19/23 9811

## 2023-03-19 NOTE — Consult Note (Signed)
Hospital Consult    Reason for Consult:  Ischemic Toe right lower extremity. Requesting Physician:  Dr Corena Herter MD MRN #:  [id number]  History of Present Illness: This is a 82 y.o. male past medical history significant for peripheral arterial disease on Plavix, who presents to the emergency department with concern for an ischemic toe. Patient first noted a wound to his second toe on his right foot last week that has progressively worsened over the past couple of days. Evaluated today at primary care physician office and sent to the emergency department given concern for an acute ischemic toe with surrounding cellulitis and possible need for amputation.   Patient has a history with vascular surgery and was last seen in 2020 by Dr Gilda Crease. He was to follow up with bilateral lower extremity U/S with ABI's but never did. He presents to day with gangrene of his 3rd toe on his right foot. Positive odor. Wife at the bedside states it started last week. He denies any chest pain or shortness of breath today. Denies any dizziness, Nausea, vomiting or diarrhea. Denies any recent fevers or chills. Vital all remain stable.   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)    Diabetes mellitus without complication (HCC)    Hypertension    Wears dentures    Full upper and lower (loose)    Past Surgical History:  Procedure Laterality Date   AMPUTATION TOE Left 03/28/2018   Procedure: AMPUTATION TOE/ PARTIAL RAY RESECTION-LEFT 2ND AND 3RD;  Surgeon: Linus Galas, DPM;  Location: [facility];  Service: Podiatry;  Laterality: Left;   CATARACT EXTRACTION W/PHACO Left 02/10/2019   Procedure: CATARACT EXTRACTION PHACO AND INTRAOCULAR LENS PLACEMENT (IOC)  LEFT DIABETIC;  Surgeon: Nevada Crane, MD;  Location: [facility];  Service: Ophthalmology;  Laterality: Left;  Diabetic - oral meds   CATARACT EXTRACTION W/PHACO Right 04/20/2019    Procedure: CATARACT EXTRACTION PHACO AND INTRAOCULAR LENS PLACEMENT (IOC)  RIGHT DIABETIC;  Surgeon: Nevada Crane, MD;  Location: [facility];  Service: Ophthalmology;  Laterality: Right;   HERNIA REPAIR     LOWER EXTREMITY ANGIOGRAPHY Left 03/26/2018   Procedure: Lower Extremity Angiography;  Surgeon: Renford Dills, MD;  Location: [facility];  Service: Cardiovascular;  Laterality: Left;   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   Procedure: TRANSMETATARSAL AMPUTATION;  Surgeon: Recardo Evangelist, DPM;  Location: [facility];  Service: Podiatry;  Laterality: Left;    Allergies  Allergen Reactions   Influenza Virus Vaccine Other (See Comments) and Nausea And Vomiting    Fever, chills, vomiting.     Prior to Admission medications   Medication Sig Start Date End Date Taking? Authorizing Provider  amLODipine (NORVASC) 2.5 MG tablet Take 2.5 mg by mouth daily. 03/14/18   [provider]  clopidogrel (PLAVIX) 75 MG tablet Take 75 mg by mouth daily.    [provider]  metFORMIN (GLUCOPHAGE) 500 MG tablet Take by mouth daily with breakfast.    [provider]  metoprolol tartrate (LOPRESSOR) 25 MG tablet Take tablet TWO hours prior to his cardiac CT scan. 09/18/22   Debbe Odea, MD  Multiple Vitamin (MULTIVITAMIN) tablet Take 1 tablet by mouth daily.    [provider]  valsartan (DIOVAN) 80 MG tablet Take 80 mg by mouth daily.    [provider]    Social History   Socioeconomic History   Marital status: Married  Spouse name: Not on file   Number of children: Not on file   Years of education: Not on file   Highest education level: Not on file  Occupational History   Not on file  Tobacco Use   Smoking status: Former    Types: Cigarettes    Quit date: 45    Years since quitting: 39.5   Smokeless tobacco: Never  Vaping Use   Vaping Use: Never used  Substance and Sexual Activity   Alcohol use: Never     Comment: rare - couple times/yr   Drug use: Never   Sexual activity: Never  Other Topics Concern   Not on file  Social History Narrative   Not on file   Social Determinants of Health   Financial Resource Strain: Not on file  Food Insecurity: Not on file  Transportation Needs: Not on file  Physical Activity: Not on file  Stress: Not on file  Social Connections: Not on file  Intimate Partner Violence: Not on file     Family History  Problem Relation Age of Onset   Diabetes Mother    Cancer Father    Hypertension Maternal Grandfather    Heart attack Maternal Grandfather     ROS: Otherwise negative unless mentioned in HPI  Physical Examination  Vitals:   03/19/23 1429 03/19/23 1545  BP: (!) 149/65 (!) 177/84  Pulse: 67 (!) 59  Resp: 18 13  Temp: 97.9 F (36.6 C)   SpO2: 100% 99%   Body mass index is 26.62 kg/m.  General:  WDWN in NAD Gait: Not observed HENT: WNL, normocephalic Pulmonary: normal non-labored breathing, without Rales, rhonchi,  wheezing Cardiac: regular, without  Murmurs, rubs or gallops; without carotid bruits Abdomen: Positive bowel sounds,  soft, NT/ND, no masses Skin: without rashes Vascular Exam/Pulses: Palpable pulses throughout but very weak in right lower extremity both popliteal, PT/DP.  Extremities: with ischemic changes, with Gangrene , with cellulitis; with open wounds;  Musculoskeletal: no muscle wasting or atrophy  Neurologic: A&O X 3;  No focal weakness or paresthesias are detected; speech is fluent/normal Psychiatric:  The pt has Normal affect. Lymph:  Unremarkable  CBC    Component Value Date/Time   WBC 11.2 (H) 03/19/2023 1518   RBC 4.13 (L) 03/19/2023 1518   HGB 12.3 (L) 03/19/2023 1518   HCT 37.9 (L) 03/19/2023 1518   PLT 238 03/19/2023 1518   MCV 91.8 03/19/2023 1518   MCH 29.8 03/19/2023 1518   MCHC 32.5 03/19/2023 1518   RDW 12.3 03/19/2023 1518   LYMPHSABS 2.4 03/19/2023 1518   MONOABS 0.9 03/19/2023 1518    EOSABS 0.3 03/19/2023 1518   BASOSABS 0.1 03/19/2023 1518    BMET    Component Value Date/Time   NA 136 07/21/2018 0418   K 4.6 07/21/2018 0418   CL 102 07/21/2018 0418   CO2 26 07/21/2018 0418   GLUCOSE 100 (H) 07/21/2018 0418   BUN 21 07/21/2018 0418   CREATININE 1.20 09/20/2022 1537   CALCIUM 9.0 07/21/2018 0418   GFRNONAA >60 07/30/2018 1045   GFRAA >60 07/30/2018 1045    COAGS: Lab Results  Component Value Date   INR 1.2 03/19/2023   INR 1.25 07/18/2018     Non-Invasive Vascular Imaging:   None ordered at this time.   Statin:  No. Beta Blocker:  Yes.   Aspirin:  No. ACEI:  No. ARB:  Yes.   CCB use:  Yes Other antiplatelets/anticoagulants:  Yes.   Plavix 75 mg  daily.    ASSESSMENT/PLAN: This is a 82 y.o. male with a history of PAD last seen by vascular surgery in 2020. Patient presents to [facility] from PCP office for concern of acute ischemia to right lower extremity toes with possible need for amputation.   PLAN: Vascular Surgery plans on taking the patient to the vascular lab tomorrow 03/20/2023 for a right lower extremity angiogram with possible intervention. I discussed in detail with the patient and wife at the bedside, the procedure, benefits, risks and complications. He verbalizes his understanding. I answered all the patients questions. He would like to proceed as soon as possible. Patient will be made NPO after midnight for procedure tomorrow.    -I discussed the plan in detail with Dr Vilinda Flake MD and he agrees with the plan.    Marcie [facility] 03/19/2023 3:55 PM

## 2023-03-19 NOTE — H&P (Signed)
History and Physical    [name] ZOX:[id number] DOB: January 03, 1941 DOA: 03/19/2023  Referring MD/NP/PA:   PCP: Wilford Corner, PA-C   Patient coming from:  The patient is coming from home.     Chief Complaint: pain and discoloration in the right second toe  HPI: [name] is a 82 y.o. 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  Patient states that he noted a wound to his right second toe last week that has progressively worsened over the past couple of days.  He has mild pain which is 2 out of 10 in severity.  No fever or chills.  The right second toe becomes black in color.  She denies chest pain, cough, SOB.  No nausea, vomiting, diarrhea or abdominal pain.  No symptoms of UTI. Pt was seen by PCP today, and sent to the emergency department given concern for an acute ischemic toe with surrounding cellulitis and possible need for amputation.   Data reviewed independently and ED Course: pt was found to have WBC 11.2, lactic acid of 1.3, stable renal function, temperature normal, blood pressure 163/55, heart rate 59, RR 18, oxygen saturation 99% on room air.  Chest x-ray negative.  Patient is admitted to telemetry bed as inpatient.  Dr. Gilda Crease  of [facility] and Dr. Logan Bores of podiatry are consulted.  X-ray of right foot: 1. Visualization of the fourth digit is somewhat limited by overlapping hammertoe deformity. Within this limitation, there appears to be gross bony destruction of the distal digit and diffuse edema of the digit. Some degree of this bony deformity appears chronic. This may reflect osteomyelitis or gangrene acutely superimposed upon a chronic fracture or other abnormality given stated clinical concern. Consider MRI to further evaluate. 2. Diffuse soft tissue edema about the distal forefoot.   EKG: I have  personally reviewed.  Sinus rhythm, QTc 494, early R wave progression, incomplete right bundle blockage   Review of Systems:   General: no fevers, chills, no body weight gain,  has fatigue HEENT: no blurry vision, hearing changes or sore throat Respiratory: no dyspnea, coughing, wheezing CV: no chest pain, no palpitations GI: no nausea, vomiting, abdominal pain, diarrhea, constipation GU: no dysuria, burning on urination, increased urinary frequency, hematuria  Ext: no leg edema. Has pain and discoloration in right second toe Neuro: no unilateral weakness, numbness, or tingling, no vision change or hearing loss Skin: no rash, no skin tear. MSK: No muscle spasm, no deformity, no limitation of range of movement in spin Heme: No easy bruising.  Travel history: No recent long distant travel.   Allergy:  Allergies  Allergen Reactions   Haemophilus B Polysaccharide Vaccine Nausea And Vomiting and Other (See Comments)    Fever, chills, vomiting.   Influenza Virus Vaccine Other (See Comments) and Nausea And Vomiting    Fever, chills, vomiting.     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)    Diabetes mellitus without complication (HCC)    Hypertension    Wears dentures    Full upper and lower (loose)    Past Surgical History:  Procedure Laterality Date   AMPUTATION TOE Left 03/28/2018   Procedure: AMPUTATION TOE/ PARTIAL RAY RESECTION-LEFT 2ND AND 3RD;  Surgeon: Linus Galas, DPM;  Location: [facility];  Service: Podiatry;  Laterality: Left;  CATARACT EXTRACTION W/PHACO Left 02/10/2019   Procedure: CATARACT EXTRACTION PHACO AND INTRAOCULAR LENS PLACEMENT (IOC)  LEFT DIABETIC;  Surgeon: Nevada Crane, MD;  Location: [facility];  Service: Ophthalmology;  Laterality: Left;  Diabetic - oral meds   CATARACT EXTRACTION W/PHACO Right 04/20/2019   Procedure: CATARACT EXTRACTION PHACO AND INTRAOCULAR LENS  PLACEMENT (IOC)  RIGHT DIABETIC;  Surgeon: Nevada Crane, MD;  Location: [facility];  Service: Ophthalmology;  Laterality: Right;   HERNIA REPAIR     LOWER EXTREMITY ANGIOGRAPHY Left 03/26/2018   Procedure: Lower Extremity Angiography;  Surgeon: Renford Dills, MD;  Location: [facility];  Service: Cardiovascular;  Laterality: Left;   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   Procedure: TRANSMETATARSAL AMPUTATION;  Surgeon: Recardo Evangelist, DPM;  Location: [facility];  Service: Podiatry;  Laterality: Left;    Social History:  reports that he quit smoking about 39 years ago. His smoking use included cigarettes. He has never used smokeless tobacco. He reports that he does not drink alcohol and does not use drugs.  Family History:  Family History  Problem Relation Age of Onset   Diabetes Mother    Cancer Father    Hypertension Maternal Grandfather    Heart attack Maternal Grandfather      Prior to Admission medications   Medication Sig Start Date End Date Taking? Authorizing Provider  amLODipine (NORVASC) 2.5 MG tablet Take 2.5 mg by mouth daily. 03/14/18   [provider]  clopidogrel (PLAVIX) 75 MG tablet Take 75 mg by mouth daily.    [provider]  metFORMIN (GLUCOPHAGE) 500 MG tablet Take by mouth daily with breakfast.    [provider]  metoprolol tartrate (LOPRESSOR) 25 MG tablet Take tablet TWO hours prior to his cardiac CT scan. 09/18/22   Debbe Odea, MD  Multiple Vitamin (MULTIVITAMIN) tablet Take 1 tablet by mouth daily.    [provider]  valsartan (DIOVAN) 80 MG tablet Take 80 mg by mouth daily.    [provider]    Physical Exam: Vitals:   03/19/23 1430 03/19/23 1545 03/19/23 1630 03/19/23 1700  BP:  (!) 177/84 (!) 163/55 (!) 163/57  Pulse:  (!) 59 (!) 59 63  Resp:  13 13 13   Temp:      TempSrc:      SpO2:  99% 99% 99%  Weight: 79.4 kg     Height: 5\' 8"  (1.727 m)      General: Not in  acute distress HEENT:       Eyes: PERRL, EOMI, no jaundice       ENT: No discharge from the ears and nose, no pharynx injection, no tonsillar enlargement.        Neck: No JVD, no bruit, no mass felt. Heme: No neck lymph node enlargement. Cardiac: S1/S2, RRR, No murmurs, No gallops or rubs. Respiratory: No rales, wheezing, rhonchi or rubs. GI: Soft, nondistended, nontender, no rebound pain, no organomegaly, BS present. GU: No hematuria Ext: 1+DP/PT pulse bilaterally. Has discoloration in right second toe, with mild tenderness, and mild surrounding erythema.     Musculoskeletal: No joint deformities, No joint redness or warmth, no limitation of ROM in spin. Skin: No rashes.  Neuro: Alert, oriented X3, cranial nerves II-XII grossly intact, moves all extremities normally.  Psych: Patient is not psychotic, no suicidal or hemocidal ideation.  Labs on Admission: I have personally reviewed following labs and imaging studies  CBC: Recent Labs  Lab 03/19/23 1518  WBC 11.2*  NEUTROABS 7.5  HGB 12.3*  HCT 37.9*  MCV 91.8  PLT 238   Basic Metabolic Panel: Recent Labs  Lab 03/19/23 1518  NA 134*  K 4.7  CL 102  CO2 23  GLUCOSE 129*  BUN 27*  CREATININE 1.27*  CALCIUM 9.4   GFR: Estimated Creatinine Clearance: 43.4 mL/min (A) (by C-G formula based on SCr of 1.27 mg/dL (H)). Liver Function Tests: Recent Labs  Lab 03/19/23 1518  AST 37  ALT 54*  ALKPHOS 80  BILITOT 0.6  PROT 7.9  ALBUMIN 4.2   No results for input(s): "LIPASE", "AMYLASE" in the last 168 hours. No results for input(s): "AMMONIA" in the last 168 hours. Coagulation Profile: Recent Labs  Lab 03/19/23 1518  INR 1.2   Cardiac Enzymes: No results for input(s): "CKTOTAL", "CKMB", "CKMBINDEX", "TROPONINI" in the last 168 hours. BNP (last 3 results) No results for input(s): "PROBNP" in the last 8760 hours. HbA1C: No results for input(s): "HGBA1C" in the last 72 hours. CBG: Recent Labs  Lab  03/19/23 1705  GLUCAP 100*   Lipid Profile: No results for input(s): "CHOL", "HDL", "LDLCALC", "TRIG", "CHOLHDL", "LDLDIRECT" in the last 72 hours. Thyroid Function Tests: No results for input(s): "TSH", "T4TOTAL", "FREET4", "T3FREE", "THYROIDAB" in the last 72 hours. Anemia Panel: No results for input(s): "VITAMINB12", "FOLATE", "FERRITIN", "TIBC", "IRON", "RETICCTPCT" in the last 72 hours. Urine analysis:    Component Value Date/Time   COLORURINE YELLOW (A) 03/19/2023 1518   APPEARANCEUR CLEAR (A) 03/19/2023 1518   LABSPEC 1.009 03/19/2023 1518   PHURINE 5.0 03/19/2023 1518   GLUCOSEU NEGATIVE 03/19/2023 1518   HGBUR NEGATIVE 03/19/2023 1518   BILIRUBINUR NEGATIVE 03/19/2023 1518   KETONESUR NEGATIVE 03/19/2023 1518   PROTEINUR NEGATIVE 03/19/2023 1518   NITRITE NEGATIVE 03/19/2023 1518   LEUKOCYTESUR NEGATIVE 03/19/2023 1518   Sepsis Labs: @LABRCNTIP (procalcitonin:4,lacticidven:4) )No results found for this or any previous visit (from the past 240 hour(s)).   Radiological Exams on Admission: DG Foot Complete Right  Result Date: 03/19/2023 CLINICAL DATA:  Infection, patient appears to have a black necrotic toe EXAM: RIGHT FOOT COMPLETE - 3+ VIEW COMPARISON:  04/04/2006 FINDINGS: Visualization of the fourth digit is somewhat limited by overlapping hammertoe deformity. Within this limitation, there appears to be gross bony destruction of the distal digit and diffuse edema of the digit. Some degree of this bony deformity appears chronic. No other fracture or dislocation. Joint spaces are well preserved. Diffuse soft tissue edema about the distal forefoot. Calcification of the plantar fascia. IMPRESSION: 1. Visualization of the fourth digit is somewhat limited by overlapping hammertoe deformity. Within this limitation, there appears to be gross bony destruction of the distal digit and diffuse edema of the digit. Some degree of this bony deformity appears chronic. This may reflect  osteomyelitis or gangrene acutely superimposed upon a chronic fracture or other abnormality given stated clinical concern. Consider MRI to further evaluate. 2. Diffuse soft tissue edema about the distal forefoot. Electronically Signed   By: Jearld Lesch M.D.   On: 03/19/2023 15:36   DG Chest 2 View  Result Date: 03/19/2023 CLINICAL DATA:  Suspected Sepsis EXAM: CHEST - 2 VIEW COMPARISON:  CT 09/20/2022 FINDINGS: Low lung volumes.  No confluent airspace disease or overt edema. Heart size and mediastinal contours are within normal limits. Aortic Atherosclerosis (ICD10-170.0). No effusion. Visualized bones unremarkable. IMPRESSION: Low volumes. No acute findings. Electronically Signed   By: Corlis Leak M.D.   On: 03/19/2023 15:34      Assessment/Plan Principal  Problem:   Diabetic foot infection (HCC) Active Problems:   Ischemic necrosis of toe (HCC)   PVD (peripheral vascular disease) (HCC)   Essential hypertension   Chronic kidney disease, stage 3a (HCC)   Chronic diastolic CHF (congestive heart failure) (HCC)   CAD (coronary artery disease)   Assessment and Plan:  Diabetic foot infection right second toe and ischemic necrosis of right second toe:  Concerning for osteomyelitis. Patient has mild leukocytosis with WBC 11.2.  Does not meet criteria for sepsis (heart rate 59, RR 18, temperature normal).  Patient likely needs amputation of second toe. Consulted Dr. Gilda Crease of [facility] and Dr. Logan Bores of podiatry.    - will admit to tele bed as inpatient - Empiric antimicrobial treatment with vancomycin Flagyl, Rocephin - PRN Zofran for nausea, tylenol and Percocet for pain - Blood cultures x 2  - ESR and CRP - IVF: 1.L of NS bolus in ED,  PVD (peripheral vascular disease) (HCC)  -Dr. Gilda Crease planning to do angiogram tomorrow -Hold Plavix since patient likely needs surgery  Essential hypertension: Blood pressure 163/55. Patient is not taking medications currently -IV hydralazine as needed -If  blood pressure is persistently elevated, may need to start oral medications.  Chronic kidney disease, stage 3a (HCC): Stable. -Follow-up provide BMP  Chronic diastolic CHF (congestive heart failure) (HCC): 2D echo on 08/27/2022 showed EF> 55% with grade 1 diastolic dysfunction.  Patient does not have leg edema or JVD.  No shortness of breath.  CHF is compensated. -Watch volume status closely -check BNP  CAD (coronary artery disease): No chest pain. -Hold Plavix    DVT ppx: SQ Heparin    Code Status: Full code     Family Communication:  Yes, patient's wife at bed side.    Disposition Plan:  Anticipate discharge back to previous environment  Consults called:  Dr. Gilda Crease for [facility] and Dr. Logan Bores of podiatry are consulted.  Admission status and Level of care: Telemetry Cardiac:   as inpt      Dispo: The patient is from: Home              Anticipated d/c is to: Home              Anticipated d/c date is: 2 days              Patient currently is not medically stable to d/c.    Severity of Illness:  The appropriate patient status for this patient is INPATIENT. Inpatient status is judged to be reasonable and necessary in order to provide the required intensity of service to ensure the patient's safety. The patient's presenting symptoms, physical exam findings, and initial radiographic and laboratory data in the context of their chronic comorbidities is felt to place them at high risk for further clinical deterioration. Furthermore, it is not anticipated that the patient will be medically stable for discharge from the hospital within 2 midnights of admission.   * I certify that at the point of admission it is my clinical judgment that the patient will require inpatient hospital care spanning beyond 2 midnights from the point of admission due to high intensity of service, high risk for further deterioration and high frequency of surveillance required.*       Date of Service 03/19/2023     Lorretta Harp [facility]   If 7PM-7AM, please contact night-coverage [email or web address] 03/19/2023, 5:48 PM

## 2023-03-19 NOTE — ED Triage Notes (Signed)
Pt here from [facility] with infected 2nd toe on right foot. Pt was sent here for admission per [facility] provider. Toe is black in color with foul odor.

## 2023-03-20 ENCOUNTER — Encounter
Admission: EM | Disposition: A | Discharge status: Home / Self Care | Payer: Self-pay | Source: Ambulatory Visit | Attending: Internal Medicine

## 2023-03-20 ENCOUNTER — Encounter: Payer: Self-pay | Admitting: Internal Medicine

## 2023-03-20 DIAGNOSIS — I70261 Atherosclerosis of native arteries of extremities with gangrene, right leg: Secondary | ICD-10-CM

## 2023-03-20 DIAGNOSIS — I700 Atherosclerosis of aorta: Secondary | ICD-10-CM

## 2023-03-20 DIAGNOSIS — I701 Atherosclerosis of renal artery: Secondary | ICD-10-CM

## 2023-03-20 DIAGNOSIS — Z9889 Other specified postprocedural states: Secondary | ICD-10-CM

## 2023-03-20 HISTORY — PX: LOWER EXTREMITY ANGIOGRAPHY: CATH118251

## 2023-03-20 LAB — CBC
HCT: 35.5 % — ABNORMAL LOW (ref 39.0–52.0)
Hemoglobin: 11.4 g/dL — ABNORMAL LOW (ref 13.0–17.0)
MCH: 29.8 pg (ref 26.0–34.0)
MCHC: 32.1 g/dL (ref 30.0–36.0)
MCV: 92.9 fL (ref 80.0–100.0)
Platelets: 194 10*3/uL (ref 150–400)
RBC: 3.82 MIL/uL — ABNORMAL LOW (ref 4.22–5.81)
RDW: 12.4 % (ref 11.5–15.5)
WBC: 9.2 10*3/uL (ref 4.0–10.5)
nRBC: 0 % (ref 0.0–0.2)

## 2023-03-20 LAB — BASIC METABOLIC PANEL
Anion gap: 8 (ref 5–15)
Anion gap: 10 (ref 5–15)
BUN: 26 mg/dL — ABNORMAL HIGH (ref 8–23)
BUN: 23 mg/dL (ref 8–23)
CO2: 22 mmol/L (ref 22–32)
CO2: 18 mmol/L — ABNORMAL LOW (ref 22–32)
Calcium: 8.8 mg/dL — ABNORMAL LOW (ref 8.9–10.3)
Calcium: 8.6 mg/dL — ABNORMAL LOW (ref 8.9–10.3)
Chloride: 106 mmol/L (ref 98–111)
Chloride: 106 mmol/L (ref 98–111)
Creatinine, Ser: 1.12 mg/dL (ref 0.61–1.24)
Creatinine, Ser: 1.08 mg/dL (ref 0.61–1.24)
GFR, Estimated: >60 mL/min (ref >60)
GFR, Estimated: >60 mL/min (ref >60)
Glucose, Bld: 125 mg/dL — ABNORMAL HIGH (ref 70–99)
Glucose, Bld: 130 mg/dL — ABNORMAL HIGH (ref 70–99)
Potassium: 4.3 mmol/L (ref 3.5–5.1)
Potassium: 4.5 mmol/L (ref 3.5–5.1)
Sodium: 136 mmol/L (ref 135–145)
Sodium: 134 mmol/L — ABNORMAL LOW (ref 135–145)

## 2023-03-20 LAB — CBG MONITORING, ED
Glucose-Capillary: 124 mg/dL — ABNORMAL HIGH (ref 70–99)
Glucose-Capillary: 99 mg/dL (ref 70–99)
Glucose-Capillary: 98 mg/dL (ref 70–99)

## 2023-03-20 LAB — APTT: aPTT: 31 seconds (ref 24–36)

## 2023-03-20 LAB — GLUCOSE, CAPILLARY
Glucose-Capillary: 84 mg/dL (ref 70–99)
Glucose-Capillary: 187 mg/dL — ABNORMAL HIGH (ref 70–99)

## 2023-03-20 LAB — C-REACTIVE PROTEIN: CRP: 3.9 mg/dL — ABNORMAL HIGH (ref <1.0)

## 2023-03-20 LAB — MRSA NEXT GEN BY PCR, NASAL: MRSA by PCR Next Gen: NEGATIVE — AB

## 2023-03-20 SURGERY — LOWER EXTREMITY ANGIOGRAPHY
Anesthesia: Moderate Sedation | Laterality: Right

## 2023-03-20 MED ORDER — ACETAMINOPHEN 325 MG PO TABS: 650.0000 mg | ORAL_TABLET | ORAL | Status: DC | PRN

## 2023-03-20 MED ORDER — DIPHENHYDRAMINE HCL 50 MG/ML IJ SOLN: 50.0000 mg | Freq: Once | INTRAMUSCULAR | Status: DC | PRN

## 2023-03-20 MED ORDER — HEPARIN SODIUM (PORCINE) 1000 UNIT/ML IJ SOLN
INTRAMUSCULAR | Status: AC
  Filled 2023-03-20: qty 10

## 2023-03-20 MED ORDER — MIDAZOLAM HCL 2 MG/ML PO SYRP: 8.0000 mg | ORAL_SOLUTION | Freq: Once | ORAL | Status: DC | PRN

## 2023-03-20 MED ORDER — MORPHINE SULFATE (PF) 4 MG/ML IV SOLN: 2.0000 mg | INTRAVENOUS | Status: DC | PRN

## 2023-03-20 MED ORDER — ONDANSETRON HCL 4 MG/2ML IJ SOLN: 4.0000 mg | Freq: Four times a day (QID) | INTRAMUSCULAR | Status: DC | PRN

## 2023-03-20 MED ORDER — MIDAZOLAM HCL 5 MG/5ML IJ SOLN
INTRAMUSCULAR | Status: AC
  Filled 2023-03-20: qty 5

## 2023-03-20 MED ORDER — FENTANYL CITRATE (PF) 100 MCG/2ML IJ SOLN
INTRAMUSCULAR | Status: AC
  Filled 2023-03-20: qty 2

## 2023-03-20 MED ORDER — CEFAZOLIN SODIUM-DEXTROSE 2-4 GM/100ML-% IV SOLN
2.0000 g | INTRAVENOUS | Status: AC
  Administered 2023-03-20: 2 g via INTRAVENOUS

## 2023-03-20 MED ORDER — HYDROMORPHONE HCL 1 MG/ML IJ SOLN: 1.0000 mg | Freq: Once | INTRAMUSCULAR | Status: DC | PRN

## 2023-03-20 MED ORDER — METHYLPREDNISOLONE SODIUM SUCC 125 MG IJ SOLR: 125.0000 mg | Freq: Once | INTRAMUSCULAR | Status: DC | PRN

## 2023-03-20 MED ORDER — CLOPIDOGREL BISULFATE 75 MG PO TABS
75.0000 mg | ORAL_TABLET | Freq: Every day | ORAL | Status: DC
  Administered 2023-03-21: 75 mg via ORAL
  Filled 2023-03-20: qty 1

## 2023-03-20 MED ORDER — SODIUM CHLORIDE 0.9% FLUSH: 3.0000 mL | INTRAVENOUS | Status: DC | PRN

## 2023-03-20 MED ORDER — IODIXANOL 320 MG/ML IV SOLN
INTRAVENOUS | Status: DC | PRN
  Administered 2023-03-20: 65 mL

## 2023-03-20 MED ORDER — SODIUM CHLORIDE 0.9 % IV SOLN: 250.0000 mL | INTRAVENOUS | Status: DC | PRN

## 2023-03-20 MED ORDER — MIDAZOLAM HCL 2 MG/2ML IJ SOLN
INTRAMUSCULAR | Status: DC | PRN
  Administered 2023-03-20: 2 mg via INTRAVENOUS

## 2023-03-20 MED ORDER — FAMOTIDINE 20 MG PO TABS: 40.0000 mg | ORAL_TABLET | Freq: Once | ORAL | Status: DC | PRN

## 2023-03-20 MED ORDER — HEPARIN SODIUM (PORCINE) 1000 UNIT/ML IJ SOLN
INTRAMUSCULAR | Status: DC | PRN
  Administered 2023-03-20: 6000 [IU] via INTRAVENOUS

## 2023-03-20 MED ORDER — LABETALOL HCL 5 MG/ML IV SOLN: 10.0000 mg | INTRAVENOUS | Status: DC | PRN

## 2023-03-20 MED ORDER — SODIUM CHLORIDE 0.9 % IV SOLN: INTRAVENOUS | Status: AC

## 2023-03-20 MED ORDER — HYDRALAZINE HCL 20 MG/ML IJ SOLN: 5.0000 mg | INTRAMUSCULAR | Status: DC | PRN

## 2023-03-20 MED ORDER — SODIUM CHLORIDE 0.9 % IV SOLN: INTRAVENOUS | Status: DC

## 2023-03-20 MED ORDER — OXYCODONE HCL 5 MG PO TABS: 5.0000 mg | ORAL_TABLET | ORAL | Status: DC | PRN

## 2023-03-20 MED ORDER — FENTANYL CITRATE (PF) 100 MCG/2ML IJ SOLN
INTRAMUSCULAR | Status: DC | PRN
  Administered 2023-03-20: 50 ug via INTRAVENOUS

## 2023-03-20 MED ORDER — LIDOCAINE HCL (PF) 1 % IJ SOLN
INTRAMUSCULAR | Status: AC
  Filled 2023-03-20: qty 30

## 2023-03-20 MED ORDER — SODIUM CHLORIDE 0.9% FLUSH
3.0000 mL | Freq: Two times a day (BID) | INTRAVENOUS | Status: DC
  Administered 2023-03-21: 3 mL via INTRAVENOUS

## 2023-03-20 MED ORDER — FENTANYL CITRATE (PF) 100 MCG/2ML IJ SOLN: 12.5000 ug | Freq: Once | INTRAMUSCULAR | Status: DC | PRN

## 2023-03-20 MED ORDER — LIDOCAINE HCL (PF) 1 % IJ SOLN
INTRAMUSCULAR | Status: DC | PRN
  Administered 2023-03-20: 10 mL

## 2023-03-20 MED ORDER — CEFAZOLIN SODIUM-DEXTROSE 2-4 GM/100ML-% IV SOLN
INTRAVENOUS | Status: AC
  Filled 2023-03-20: qty 100

## 2023-03-20 SURGICAL SUPPLY — 25 items
BALLN LUTONIX DCB 5X40X130 (BALLOONS) ×1
BALLN LUTONIX DCB 6X80X130 (BALLOONS) ×1
BALLOON LUTONIX DCB 5X40X130 (BALLOONS) IMPLANT
BALLOON LUTONIX DCB 6X80X130 (BALLOONS) IMPLANT
CATH ANGIO 5F PIGTAIL 65CM (CATHETERS) IMPLANT
CATH NAVICROSS ANGLED 135CM (MICROCATHETER) IMPLANT
CATH SEEKER .035X135CM (CATHETERS) IMPLANT
CATH VERT 5FR 125CM (CATHETERS) IMPLANT
COVER PROBE ULTRASOUND 5X96 (MISCELLANEOUS) IMPLANT
DEVICE STARCLOSE SE CLOSURE (Vascular Products) IMPLANT
GLIDEWIRE ADV .035X260CM (WIRE) IMPLANT
GLIDEWIRE ANGLED SS 035X260CM (WIRE) IMPLANT
GOWN STRL REUS W/ TWL LRG LVL3 (GOWN DISPOSABLE) ×1 IMPLANT
GOWN STRL REUS W/TWL LRG LVL3 (GOWN DISPOSABLE) ×1
GUIDEWIRE ADV .018X180CM (WIRE) IMPLANT
KIT ENCORE 26 ADVANTAGE (KITS) IMPLANT
NDL ENTRY 21GA 7CM ECHOTIP (NEEDLE) IMPLANT
NEEDLE ENTRY 21GA 7CM ECHOTIP (NEEDLE) ×1 IMPLANT
PACK ANGIOGRAPHY (CUSTOM PROCEDURE TRAY) ×1 IMPLANT
SET INTRO CAPELLA COAXIAL (SET/KITS/TRAYS/PACK) IMPLANT
SHEATH BRITE TIP 5FRX11 (SHEATH) IMPLANT
SHEATH RAABE 6FR (SHEATH) IMPLANT
SYR MEDRAD MARK 7 150ML (SYRINGE) IMPLANT
TUBING CONTRAST HIGH PRESS 72 (TUBING) IMPLANT
WIRE GUIDERIGHT .035X150 (WIRE) IMPLANT

## 2023-03-20 NOTE — Op Note (Signed)
[facility]  Percutaneous Study/Intervention Procedural Note   Date of Surgery: 03/20/2023  Surgeon:  Renford Dills, MD.  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  Post-operative diagnosis:  Same  Procedure(s) Performed:             1.  Introduction catheter into right lower extremity 3rd order catheter placement              2.    Contrast injection right lower extremity for distal runoff             3.  Percutaneous transluminal angioplasty right superficial femoral artery              4.  Star close closure left common femoral arteriotomy  Anesthesia: Conscious sedation was administered under my direct supervision by the interventional radiology RN. IV Versed plus fentanyl were utilized. Continuous ECG, pulse oximetry and blood pressure was monitored throughout the entire procedure.  Conscious sedation was for a total of 62 minutes.  Sheath: 6 [place] Rabie left common femoral retrograde  Contrast: 65 cc  Fluoroscopy Time: 15.5 minutes  Indications:  [name] presents with gangrenous changes to the third toe over the right foot.  He has known atherosclerotic occlusive disease and has had vascular interventions in the past.  This places him at risk for limb loss.  Angiography with hope for intervention is recommended for limb salvage.  The risks and benefits are reviewed all questions answered patient agrees to proceed.  Procedure:  [name] is a 82 y.o. y.o. male who was identified and appropriate procedural time out was performed.  The patient was then placed supine on the table and prepped and draped in the usual sterile fashion.    Ultrasound was placed in the sterile sleeve and the left groin was evaluated the left common femoral artery was echolucent and pulsatile indicating patency.  Image was recorded for the permanent record and under real-time  visualization a microneedle was inserted into the common femoral artery microwire followed by a micro-sheath.  A J-wire was then advanced through the micro-sheath and a  5 [place] sheath was then inserted over a J-wire. J-wire was then advanced and a 5 French pigtail catheter was positioned at the level of T12. AP projection of the aorta was then obtained. Pigtail catheter was repositioned to above the bifurcation and a LAO view of the pelvis was obtained.  Subsequently a pigtail catheter with the stiff angle Glidewire was used to cross the aortic bifurcation the catheter wire were advanced down into the right distal external iliac artery. Oblique view of the femoral bifurcation was then obtained and subsequently the wire was reintroduced and the pigtail catheter negotiated into the SFA representing third order catheter placement. Distal runoff was then performed.  6000 units of heparin was then given and allowed to circulate and a 6 [place] Rabie sheath was advanced up and over the bifurcation and positioned in the femoral artery  KMP  catheter and stiff angle Glidewire were then negotiated down into the distal popliteal.  Distal runoff was then completed by hand injection through the catheter. The wire was then reintroduced and a 6 mm x 80 mm Lutonix drug-eluting balloon was used to angioplasty the superficial femoral and popliteal arteries at Hunter's canal. Inflation was to 8 atmospheres for 1 minute. Follow-up imaging demonstrated patency with less than 10% residual stenosis.  A second lesion in the distal  popliteal was treated with a 5 mm x 40 mm Lutonix drug-eluting balloon inflation was to 8 atm for 1 minute.  Distal runoff was then reassessed and found to be unchanged.  Attempts at crossing the anterior tibial and the tibioperoneal trunk were not successful.  After review of these images the sheath is pulled into the left external iliac oblique of the common femoral is obtained and a Star close device  deployed. There no immediate complications.   Findings:  The abdominal aorta is opacified with a bolus injection contrast. Renal arteries are single there appears to be a greater than 80% stenosis of the left renal artery. The aorta itself has diffuse disease but no hemodynamically significant lesions. The common and external iliac arteries are widely patent bilaterally.  The right common femoral is widely patent as is the profunda femoris.  The SFA does indeed have a significant stenosis of 80% at the level of Hunter's canal which extends into the above-knee popliteal.  The distal popliteal demonstrates increasing disease with a focal lesion just below the level of the tibial plateau of greater than 75% and the trifurcation is heavily diseased with occlusion of the anterior tibial shortly after its origin as well as the tibioperoneal trunk and the posterior tibial down to the level of the ankle.  The peroneal demonstrates reconstitution at the mid calf level and is patent down to the ankle where collateralizes to the distal posterior tibial.  Following angioplasty the SFA and the popliteal is now patent with in-line flow and looks quite nice with less than 10% residual stenosis.  However I was not able to cross the long segment occlusion and revascularize the peroneal.  Posterior tibial and anterior tibial have such a long segment occlusions they do not appear to be candidates for a meaningful revascularization.    Summary: Successful angioplasty of the SFA and popliteal in the lower extremity.  However, given his tissue loss inline flow all the way to the foot is typically necessary for meaningful healing.  He is certainly a candidate for a popliteal to peroneal bypass.  I do not see meaningful further intervention as a solution.  He will require cardiac clearance.                        Disposition: Patient was taken to the recovery room in stable condition having tolerated the procedure  well.  [name], Latina Craver 03/20/2023,7:18 PM

## 2023-03-20 NOTE — ED Notes (Signed)
Pt CBG was 99- will notify RN, Dorene Grebe

## 2023-03-20 NOTE — Interval H&P Note (Signed)
History and Physical Interval Note:  03/20/2023 5:03 PM  [name]  has presented today for surgery, with the diagnosis of PAD.  The various methods of treatment have been discussed with the patient and family. After consideration of risks, benefits and other options for treatment, the patient has consented to  Procedure(s): Lower Extremity Angiography (Right) as a surgical intervention.  The patient's history has been reviewed, patient examined, no change in status, stable for surgery.  I have reviewed the patient's chart and labs.  Questions were answered to the patient's satisfaction.     [name]

## 2023-03-20 NOTE — Progress Notes (Signed)
Only had NS infusing. Pharmacy notified per protocal

## 2023-03-20 NOTE — Progress Notes (Signed)
PHARMACY - PHYSICIAN COMMUNICATION CRITICAL VALUE ALERT - BLOOD CULTURE IDENTIFICATION (BCID)  [name] is an 82 y.o. male who presented to [facility] on 03/19/2023 with a chief complaint of diabetic foot infection.   Assessment:  Gram positive rods in 1 of 4 bottles (include suspected source if known)  Name of physician (or Provider) Contacted: [facility]  Current antibiotics: Vanc, ceftriaxone   Changes to prescribed antibiotics recommended:  Patient is on recommended antibiotics - No changes needed - most likely a contaminant, will wait for further ID   No results found for this or any previous visit.  Devarius Nelles D 03/20/2023  4:32 AM

## 2023-03-20 NOTE — Consult Note (Signed)
  Subjective:  Patient ID: [name], male    DOB: 1941/02/02,  MRN: [id number]   A 82 y.o. 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   Objective:   Vitals:   03/20/23 0700 03/20/23 0739  BP: (!) 123/58 127/63  Pulse: (!) 57 (!) 58  Resp: 13 16  Temp:  98.1 F (36.7 C)  SpO2: 98% 99%   General AA&O x3. Normal mood and affect.  Vascular Dorsalis pedis and posterior tibial pulses nonpalpable Brisk capillary sluggish pedal hair not present  Neurologic Epicritic sensation grossly intact.  Dermatologic Right second digit gangrene noted to the distal digit.  Mild erythema noted.  No malodor present dry stable  Orthopedic: MMT 5/5 in dorsiflexion, plantarflexion, inversion, and eversion. Normal joint ROM without pain or crepitus.   Narrative & Impression  CLINICAL DATA:  Infection, patient appears to have a black necrotic toe   EXAM: RIGHT FOOT COMPLETE - 3+ VIEW   COMPARISON:  04/04/2006   FINDINGS: Visualization of the fourth digit is somewhat limited by overlapping hammertoe deformity. Within this limitation, there appears to be gross bony destruction of the distal digit and diffuse edema of the digit. Some degree of this bony deformity appears chronic. No other fracture or dislocation. Joint spaces are well preserved. Diffuse soft tissue edema about the distal forefoot. Calcification of the plantar fascia.   IMPRESSION: 1. Visualization of the fourth digit is somewhat limited by overlapping hammertoe deformity. Within this limitation, there appears to be gross bony destruction of the distal digit and diffuse edema of the digit. Some degree of this bony deformity appears chronic. This may reflect osteomyelitis or gangrene acutely superimposed upon a chronic fracture or other abnormality given stated clinical  concern. Consider MRI to further evaluate. 2. Diffuse soft tissue edema about the distal forefoot.   Assessment & Plan:  Patient was evaluated and treated and all questions answered.  Right second digit gangrene -All questions and concerns were discussed with the patient in extensive detail -Patient scheduled to undergo angiography with intervention today to improve circulation to the right foot. -Patient will need a second digit amputation after vascular clearance -Podiatry will continue to follow -Betadine wet-to-dry dressing -Weightbearing as tolerated in surgical shoe  Candelaria Stagers, DPM  Accessible via secure chat for questions or concerns.

## 2023-03-20 NOTE — Progress Notes (Signed)
Progress Note   Patient: [name] ZOX:[id number] DOB: 11-26-1940 DOA: 03/19/2023     1 DOS: the patient was seen and examined on 03/20/2023     Subjective:  Seen and examined at bedside this morning Admits to improvement in right foot pain Still has gangrene involving the right second digit  Brief hospital course: From HPI "[name] is a 82 y.o. 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   Patient states that he noted a wound to his right second toe last week that has progressively worsened over the past couple of days.  He has mild pain which is 2 out of 10 in severity.  No fever or chills.  The right second toe becomes black in color.  She denies chest pain, cough, SOB.  No nausea, vomiting, diarrhea or abdominal pain.  No symptoms of UTI. Pt was seen by PCP today, and sent to the emergency department given concern for an acute ischemic toe with surrounding cellulitis and possible need for amputation.    Data reviewed independently and ED Course: pt was found to have WBC 11.2, lactic acid of 1.3, stable renal function, temperature normal, blood pressure 163/55, heart rate 59, RR 18, oxygen saturation 99% on room air.  Chest x-ray negative.  Patient is admitted to telemetry bed as inpatient.  Dr. Gilda Crease  of [facility] and Dr. Logan Bores of podiatry are consulted.   Assessment and Plan:  Diabetic foot infection right second toe gangrene of right second toe:  Concerning for osteomyelitis. Patient has mild leukocytosis with WBC 11.2.  Does not meet criteria for sepsis (heart rate 59, RR 18, temperature normal).  Continue management on telemetry floor -Continue empiric antibiotic with vancomycin Flagyl, Rocephin Vascular surgeon planning on angiography with intervention Plan of care discussed with podiatry who is planning amputation of the  gangrenous toe after vascular surgeon intervention Continue as needed pain medication Follow-up on culture results I personally reviewed the patient's x-ray of the right foot showing findings of osteomyelitis and gangrene involving the right second toe   PVD (peripheral vascular disease) [facility])  Vascular surgeon planning angiography today -Continue to hold Plavix since patient likely needs surgery   Essential hypertension: Blood pressure 163/55. Patient is not taking medications currently Keep on as needed IV hydralazine Monitor blood pressure closely   Chronic kidney disease, stage 3a (HCC): Stable. Monitor renal function closely   Chronic diastolic CHF (congestive heart failure) (HCC): 2D echo on 08/27/2022 showed EF> 55% with grade 1 diastolic dysfunction.  Patient does not have leg edema or JVD.  No shortness of breath.  CHF is compensated. -Watch volume status closely Follow-up on BNP Daily weight Monitor input and output   CAD (coronary artery disease): No chest pain. Continue to hold Plavix      DVT ppx: SQ Heparin     Code Status: Full code      Family Communication:  Yes, patient's wife at bed side.     Disposition Plan:  Anticipate discharge back to previous environment   Consults called: Vascular surgeon and podiatry     Physical Exam: General: Not in acute distress HEENT: Atraumatic normocephalic Heme: No neck lymph node enlargement. Cardiac: S1/S2, RRR, No murmurs, No gallops or rubs. Respiratory: No rales, wheezing, rhonchi or rubs. GI: Soft, nondistended, nontender, no rebound pain, no organomegaly, BS present. GU: No hematuria Ext: 1+DP/PT  pulse bilaterally.  Right second toe gangrenous and black    Vitals:   03/20/23 1300 03/20/23 1330 03/20/23 1419 03/20/23 1424  BP: (!) 132/59 (!) 131/57 (!) 164/112 (!) 175/69  Pulse: (!) 52 (!) 50 65 64  Resp: 15 12 18 17   Temp:   98.5 F (36.9 C)   TempSrc:   Oral   SpO2: 98% 97% 96% 97%  Weight:       Height:        Data Reviewed: I have reviewed the patient's x-ray of the right foot, laboratory results, documentation by the consultants as well as nursing staff  Author: Loyce Dys, MD 03/20/2023 3:34 PM  For on call review [email or web address].

## 2023-03-21 LAB — CBC WITH DIFFERENTIAL/PLATELET
Abs Immature Granulocytes: 0.03 10*3/uL (ref 0.00–0.07)
Basophils Absolute: 0.1 10*3/uL (ref 0.0–0.1)
Basophils Relative: 1 %
Eosinophils Absolute: 0.3 10*3/uL (ref 0.0–0.5)
Eosinophils Relative: 4 %
HCT: 31.4 % — ABNORMAL LOW (ref 39.0–52.0)
Hemoglobin: 10.5 g/dL — ABNORMAL LOW (ref 13.0–17.0)
Immature Granulocytes: 0 %
Lymphocytes Relative: 25 %
Lymphs Abs: 2 10*3/uL (ref 0.7–4.0)
MCH: 30.3 pg (ref 26.0–34.0)
MCHC: 33.4 g/dL (ref 30.0–36.0)
MCV: 90.5 fL (ref 80.0–100.0)
Monocytes Absolute: 0.6 10*3/uL (ref 0.1–1.0)
Monocytes Relative: 8 %
Neutro Abs: 5 10*3/uL (ref 1.7–7.7)
Neutrophils Relative %: 62 %
Platelets: 179 10*3/uL (ref 150–400)
RBC: 3.47 MIL/uL — ABNORMAL LOW (ref 4.22–5.81)
RDW: 12.4 % (ref 11.5–15.5)
WBC: 8.1 10*3/uL (ref 4.0–10.5)
nRBC: 0 % (ref 0.0–0.2)

## 2023-03-21 LAB — CULTURE, BLOOD (ROUTINE X 2)

## 2023-03-21 MED ORDER — ACETAMINOPHEN 325 MG PO TABS: 650.0000 mg | ORAL_TABLET | Freq: Four times a day (QID) | ORAL | 0 refills | Status: AC | PRN

## 2023-03-21 MED ORDER — VALSARTAN 80 MG PO TABS: 80.0000 mg | ORAL_TABLET | Freq: Every day | ORAL | 0 refills | Status: DC

## 2023-03-21 MED ORDER — METOPROLOL TARTRATE 25 MG PO TABS: ORAL_TABLET | ORAL | 0 refills | Status: DC

## 2023-03-21 NOTE — Progress Notes (Signed)
    Subjective  - POD #1, s/p angiogram and PTA SFA/POP  Worried about his right toe   Physical Exam:  Stable dry gangrene to right toe Foot is warm Groin access site without hematoma       Assessment/Plan:  POD #1  Cleared by cardiology for right leg bypass Surgery to be scheduled for next week Patient can be discharged home when medically appropriate and return for surgery  [name] 03/21/2023 11:48 AM --  Vitals:   03/21/23 0420 03/21/23 0728  BP: (!) 162/63 (!) 142/59  Pulse: 67 62  Resp: 18 18  Temp: 98.1 F (36.7 C) 99 F (37.2 C)  SpO2: 98% 95%    Intake/Output Summary (Last 24 hours) at 03/21/2023 1148 Last data filed at 03/21/2023 0519 Gross per 24 hour  Intake 0 ml  Output 1450 ml  Net -1450 ml     Laboratory CBC    Component Value Date/Time   WBC 8.1 03/21/2023 0544   HGB 10.5 (L) 03/21/2023 0544   HCT 31.4 (L) 03/21/2023 0544   PLT 179 03/21/2023 0544    BMET    Component Value Date/Time   NA 134 (L) 03/20/2023 1823   K 4.5 03/20/2023 1823   CL 106 03/20/2023 1823   CO2 18 (L) 03/20/2023 1823   GLUCOSE 130 (H) 03/20/2023 1823   BUN 23 03/20/2023 1823   CREATININE 1.08 03/20/2023 1823   CALCIUM 8.6 (L) 03/20/2023 1823   GFRNONAA >60 03/20/2023 1823   GFRAA >60 07/30/2018 1045    COAG Lab Results  Component Value Date   INR 1.2 03/19/2023   INR 1.25 07/18/2018   No results found for: "PTT"  Antibiotics Anti-infectives (From admission, onward)    Start     Dose/Rate Route Frequency Ordered Stop   03/20/23 1800  vancomycin (VANCOREADY) IVPB 1250 mg/250 mL        1,250 mg 166.7 mL/hr over 90 Minutes Intravenous Every 24 hours 03/19/23 1713     03/20/23 1425  ceFAZolin (ANCEF) IVPB 2g/100 mL premix        2 g 200 mL/hr over 30 Minutes Intravenous 30 min pre-op 03/20/23 1425 03/20/23 1809   03/20/23 0600  cefTRIAXone (ROCEPHIN) 2 g in sodium chloride 0.9 % 100 mL IVPB        2 g 200 mL/hr over 30 Minutes Intravenous  Every 24 hours 03/19/23 1655     03/19/23 1800  metroNIDAZOLE (FLAGYL) IVPB 500 mg        500 mg 100 mL/hr over 60 Minutes Intravenous Every 12 hours 03/19/23 1655     03/19/23 1545  ceFEPIme (MAXIPIME) 2 g in sodium chloride 0.9 % 100 mL IVPB        2 g 200 mL/hr over 30 Minutes Intravenous  Once 03/19/23 1533 03/19/23 1638   03/19/23 1545  vancomycin (VANCOREADY) IVPB 1750 mg/350 mL        1,750 mg 175 mL/hr over 120 Minutes Intravenous  Once 03/19/23 1533 03/19/23 1859        V. Charlena Cross, M.D., [facility] [place] Office: [phone] Pager:  [phone]

## 2023-03-21 NOTE — Progress Notes (Signed)
Subjective:  Patient ID: [name], male    DOB: 1941-07-28,  MRN: [id number]  Patient seen at bedside this morning s/p arteriogram. Present with hosptialist to discuss plan. 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)    Diabetes mellitus without complication (HCC)    Hypertension    Wears dentures    Full upper and lower (loose)     Past Surgical History:  Procedure Laterality Date   AMPUTATION TOE Left 03/28/2018   Procedure: AMPUTATION TOE/ PARTIAL RAY RESECTION-LEFT 2ND AND 3RD;  Surgeon: Linus Galas, DPM;  Location: [facility] OR[facility];  Service: Podiatry;  Laterality: Left;   CATARACT EXTRACTION W/PHACO Left 02/10/2019   Procedure: CATARACT EXTRACTION PHACO AND INTRAOCULAR LENS PLACEMENT (IOC)  LEFT DIABETIC;  Surgeon: Nevada Crane, MD;  Location: [facility];  Service: Ophthalmology;  Laterality: Left;  Diabetic - oral meds   CATARACT EXTRACTION W/PHACO Right 04/20/2019   Procedure: CATARACT EXTRACTION PHACO AND INTRAOCULAR LENS PLACEMENT (IOC)  RIGHT DIABETIC;  Surgeon: Nevada Crane, MD;  Location: [facility];  Service: Ophthalmology;  Laterality: Right;   HERNIA REPAIR     LOWER EXTREMITY ANGIOGRAPHY Left 03/26/2018   Procedure: Lower Extremity Angiography;  Surgeon: Renford Dills, MD;  Location: [facility];  Service: Cardiovascular;  Laterality: Left;   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   Procedure: TRANSMETATARSAL AMPUTATION;  Surgeon: Recardo Evangelist, DPM;  Location: [facility];  Service: Podiatry;  Laterality: Left;       Latest Ref Rng & Units 03/21/2023    5:44 AM 03/20/2023    2:10 AM 03/19/2023    3:18 PM  CBC  WBC 4.0 - 10.5 K/uL 8.1  9.2  11.2   Hemoglobin 13.0 - 17.0 g/dL 40.9  81.1  91.4   Hematocrit 39.0 - 52.0 % 31.4  35.5  37.9   Platelets 150 - 400 K/uL 179  194  238        Latest Ref Rng & Units 03/20/2023     6:23 PM 03/20/2023    2:10 AM 03/19/2023    3:18 PM  BMP  Glucose 70 - 99 mg/dL 782  956  213   BUN 8 - 23 mg/dL 23  26  27    Creatinine 0.61 - 1.24 mg/dL 0.86  5.78  4.69   Sodium 135 - 145 mmol/L 134  136  134   Potassium 3.5 - 5.1 mmol/L 4.5  4.3  4.7   Chloride 98 - 111 mmol/L 106  106  102   CO2 22 - 32 mmol/L 18  22  23    Calcium 8.9 - 10.3 mg/dL 8.6  8.8  9.4      Objective:   Vitals:   03/21/23 0420 03/21/23 0728  BP: (!) 162/63 (!) 142/59  Pulse: 67 62  Resp: 18 18  Temp: 98.1 F (36.7 C) 99 F (37.2 C)  SpO2: 98% 95%    General:AA&O x 3. Normal mood and affect   Vascular: DP and PT pulses non palpable  Neruological. Epicritic sensation grossly intact.   Derm: Right second digit gangrene noted  distally. No erythema or edema present. No malodor.   MSK: MMT 5/5 in dorsiflexion, plantar flexion, inversion and eversion. Normal joint ROM without pain or crepitus.     EXAM: RIGHT FOOT COMPLETE - 3+ VIEW   COMPARISON:  04/04/2006   FINDINGS: Visualization of  the fourth digit is somewhat limited by overlapping hammertoe deformity. Within this limitation, there appears to be gross bony destruction of the distal digit and diffuse edema of the digit. Some degree of this bony deformity appears chronic. No other fracture or dislocation. Joint spaces are well preserved. Diffuse soft tissue edema about the distal forefoot. Calcification of the plantar fascia.   IMPRESSION: 1. Visualization of the fourth digit is somewhat limited by overlapping hammertoe deformity. Within this limitation, there appears to be gross bony destruction of the distal digit and diffuse edema of the digit. Some degree of this bony deformity appears chronic. This may reflect osteomyelitis or gangrene acutely superimposed upon a chronic fracture or other abnormality given stated clinical concern. Consider MRI to further evaluate   Assessment & Plan:  Patient was evaluated and treated  and all questions answered.  DX: Right second digit gangrene  Wound care: betadine, DSD  -Patient cleared by cardiology and plan to have peroneal bypass but cannot be scheduled until the 17th of July. Plan is for patient to go home and have procedure outpatient.  Podiatry will plan for toe amputation outpatient as well following revascularization.  DME: post-op shoe.   Weight bearing as tolerated.  Patient will be schedule to follow-up with podiatry upon discharge.   Louann Sjogren, DPM  Accessible via secure chat for questions or concerns.

## 2023-03-21 NOTE — Consult Note (Signed)
[facility] Cardiology  CARDIOLOGY CONSULT NOTE  Patient ID: [name] MRN: [id number] DOB/AGE: Nov 01, 1940 82 y.o.  Admit date: 03/19/2023 Referring Physician Schnier Primary Physician Whitaker Primary Cardiologist [facility] Reason for Consultation preoperative cardiovascular evaluation  HPI: 82 year old gentleman referred for preoperative cardiovascular evaluation.  The patient has gangrene of right second toe.  He underwent successful angioplasty of right SFA and popliteal will require popliteal to peroneal bypass.  The patient has history of chronic HFpEF.  2D echocardiogram 08/27/2022 revealed LVEF greater than 55% with mild mitral and tricuspid regurgitation.  Coronary CT angiography was performed 09/21/2022 which revealed 25-49% LAD and RCA, less than 25% left circumflex.  The patient denies chest pain or shortness of breath.  ECG reveals sinus rhythm at 66 bpm with incomplete right bundle branch block.  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)    Diabetes mellitus without complication (HCC)    Hypertension    Wears dentures    Full upper and lower (loose)    Past Surgical History:  Procedure Laterality Date   AMPUTATION TOE Left 03/28/2018   Procedure: AMPUTATION TOE/ PARTIAL RAY RESECTION-LEFT 2ND AND 3RD;  Surgeon: Linus Galas, DPM;  Location: [facility] ORS;  Service: Podiatry;  Laterality: Left;   CATARACT EXTRACTION W/PHACO Left 02/10/2019   Procedure: CATARACT EXTRACTION PHACO AND INTRAOCULAR LENS PLACEMENT (IOC)  LEFT DIABETIC;  Surgeon: Nevada Crane, MD;  Location: [facility];  Service: Ophthalmology;  Laterality: Left;  Diabetic - oral meds   CATARACT EXTRACTION W/PHACO Right 04/20/2019   Procedure: CATARACT EXTRACTION PHACO AND INTRAOCULAR LENS PLACEMENT (IOC)  RIGHT DIABETIC;  Surgeon: Nevada Crane, MD;  Location:  [facility];  Service: Ophthalmology;  Laterality: Right;   HERNIA REPAIR     LOWER EXTREMITY ANGIOGRAPHY Left 03/26/2018   Procedure: Lower Extremity Angiography;  Surgeon: Renford Dills, MD;  Location: [facility] INVASIVE CV LAB;  Service: Cardiovascular;  Laterality: Left;   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   Procedure: TRANSMETATARSAL AMPUTATION;  Surgeon: Recardo Evangelist, DPM;  Location: [facility] ORS;  Service: Podiatry;  Laterality: Left;    Medications Prior to Admission  Medication Sig Dispense Refill Last Dose   clopidogrel (PLAVIX) 75 MG tablet Take 75 mg by mouth daily.   03/19/2023   Multiple Vitamin (MULTIVITAMIN) tablet Take 1 tablet by mouth 3 (three) times a week.   Past Week   metoprolol tartrate (LOPRESSOR) 25 MG tablet Take tablet TWO hours prior to his cardiac CT scan. (Patient not taking: Reported on 03/19/2023) 1 tablet 0 Not Taking   valsartan (DIOVAN) 80 MG tablet Take 80 mg by mouth daily. (Patient not taking: Reported on 03/19/2023)   Not Taking   Social History   Socioeconomic History   Marital status: Married    Spouse name: Not on file   Number of children: Not on file   Years of education: Not on file   Highest education level: Not on file  Occupational History   Not on file  Tobacco Use   Smoking status: Former    Types: Cigarettes    Quit date: 57    Years since quitting: 39.5   Smokeless tobacco: Never  Vaping Use   Vaping Use: Never used  Substance and Sexual Activity   Alcohol use: Never    Comment: rare - couple times/yr   Drug  use: Never   Sexual activity: Never  Other Topics Concern   Not on file  Social History Narrative   Not on file   Social Determinants of Health   Financial Resource Strain: Not on file  Food Insecurity: No Food Insecurity (03/20/2023)   Hunger Vital Sign    Worried About Running Out of Food in the Last Year: Never true    Ran Out of Food in the Last Year: Never true  Transportation Needs: No  Transportation Needs (03/20/2023)   PRAPARE - Administrator, Civil Service (Medical): No    Lack of Transportation (Non-Medical): No  Physical Activity: Not on file  Stress: Not on file  Social Connections: Not on file  Intimate Partner Violence: Not At Risk (03/20/2023)   Humiliation, Afraid, Rape, and Kick questionnaire    Fear of Current or Ex-Partner: No    Emotionally Abused: No    Physically Abused: No    Sexually Abused: No    Family History  Problem Relation Age of Onset   Diabetes Mother    Cancer Father    Hypertension Maternal Grandfather    Heart attack Maternal Grandfather       Review of systems complete and found to be negative unless listed above      PHYSICAL EXAM  General: Well developed, well nourished, in no acute distress HEENT:  Normocephalic and atramatic Neck:  No JVD.  Lungs: Clear bilaterally to auscultation and percussion. Heart: HRRR . Normal S1 and S2 without gallops or murmurs.  Abdomen: Bowel sounds are positive, abdomen soft and non-tender  Msk:  Back normal, normal gait. Normal strength and tone for age. Extremities: No clubbing, cyanosis or edema.   Neuro: Alert and oriented X 3. Psych:  Good affect, responds appropriately  Labs:   Lab Results  Component Value Date   WBC 8.1 03/21/2023   HGB 10.5 (L) 03/21/2023   HCT 31.4 (L) 03/21/2023   MCV 90.5 03/21/2023   PLT 179 03/21/2023    Recent Labs  Lab 03/19/23 1518 03/20/23 0210 03/20/23 1823  NA 134*   < > 134*  K 4.7   < > 4.5  CL 102   < > 106  CO2 23   < > 18*  BUN 27*   < > 23  CREATININE 1.27*   < > 1.08  CALCIUM 9.4   < > 8.6*  PROT 7.9  --   --   BILITOT 0.6  --   --   ALKPHOS 80  --   --   ALT 54*  --   --   AST 37  --   --   GLUCOSE 129*   < > 130*   < > = values in this interval not displayed.   No results found for: "CKTOTAL", "CKMB", "CKMBINDEX", "TROPONINI" No results found for: "CHOL" No results found for: "HDL" No results found for:  "LDLCALC" No results found for: "TRIG" No results found for: "CHOLHDL" No results found for: "LDLDIRECT"    Radiology: PERIPHERAL VASCULAR CATHETERIZATION  Result Date: 03/20/2023 See surgical note for result.  DG Foot Complete Right  Result Date: 03/19/2023 CLINICAL DATA:  Infection, patient appears to have a black necrotic toe EXAM: RIGHT FOOT COMPLETE - 3+ VIEW COMPARISON:  04/04/2006 FINDINGS: Visualization of the fourth digit is somewhat limited by overlapping hammertoe deformity. Within this limitation, there appears to be gross bony destruction of the distal digit and diffuse edema of the digit. Some degree  of this bony deformity appears chronic. No other fracture or dislocation. Joint spaces are well preserved. Diffuse soft tissue edema about the distal forefoot. Calcification of the plantar fascia. IMPRESSION: 1. Visualization of the fourth digit is somewhat limited by overlapping hammertoe deformity. Within this limitation, there appears to be gross bony destruction of the distal digit and diffuse edema of the digit. Some degree of this bony deformity appears chronic. This may reflect osteomyelitis or gangrene acutely superimposed upon a chronic fracture or other abnormality given stated clinical concern. Consider MRI to further evaluate. 2. Diffuse soft tissue edema about the distal forefoot. Electronically Signed   By: Jearld Lesch M.D.   On: 03/19/2023 15:36   DG Chest 2 View  Result Date: 03/19/2023 CLINICAL DATA:  Suspected Sepsis EXAM: CHEST - 2 VIEW COMPARISON:  CT 09/20/2022 FINDINGS: Low lung volumes.  No confluent airspace disease or overt edema. Heart size and mediastinal contours are within normal limits. Aortic Atherosclerosis (ICD10-170.0). No effusion. Visualized bones unremarkable. IMPRESSION: Low volumes. No acute findings. Electronically Signed   By: Corlis Leak M.D.   On: 03/19/2023 15:34    EKG: Sinus rhythm 66 bpm with incomplete right bundle branch block  ASSESSMENT  AND PLAN:   1.  Preoperative cardiovascular evaluation.  Patient currently chest pain-free.  ECG revealed sinus rhythm at 66 bpm with incomplete right bundle branch block without evidence for ischemic ECG changes.  2D echocardiogram 08/27/2022 revealed normal left ventricular function.  Coronary CT angiography 09/21/2022 revealed mild, nonobstructive coronary artery disease.  Patient currently is at low and acceptable vascular risk, and is medically optimized for right popliteal peroneal bypass. 2.  Chronic HFpEF, 2D echocardiogram 08/27/2022 reveals normal left ventricular function with LVEF grain 55%.  Patient currently appears euvolemic. 3.  Mild, nonobstructive coronary artery disease, coronary CT angiography 09/21/2022 revealed 25-49% LAD and RCA, less than 25% left circumflex. 4.  Gangrene right second toe, status post successful angioplasty right SFA and popliteal, will require popliteal to peroneal bypass  Recommendations  1.  Agree with current therapy 2.  Defer further cardiac diagnostics at this time 3.  Proceed with right popliteal peroneal bypass  Signed: Marcina Millard MD,PhD, [facility] 03/21/2023, 9:10 AM

## 2023-03-21 NOTE — Discharge Summary (Signed)
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 Chronic diastolic CHF (congestive heart failure) (HCC):  CAD (coronary artery disease): No chest pain.   Hospital Course:  [name] is a 82 y.o. 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   Patient states that he noted a wound to his right second toe last week that has progressively worsened over the past couple of days.  He has mild pain which is 2 out of 10 in severity.  He was seen by his PCP and sent to the emergency department given concern for an acute ischemic toe with possible need for amputation.    Data reviewed independently and ED Course: pt was found to have WBC 11.2, lactic acid of 1.3, stable renal function, temperature normal, blood pressure 163/55, heart rate 59, RR 18, oxygen saturation 99% on room air.  Chest x-ray negative.  Patient is admitted to telemetry bed as inpatient.  Dr. Gilda [name]  of [facility] and Dr. Logan [name] of podiatry are consulted. Patient was seen by vascular surgeon and underwent angiogram that showed severe lower extremity disease and have therefore been planned for bypass surgery hopefully within the next 1 to 2 weeks.  Patient has now been cleared by cardiology.  After vascular surgery patient would need amputation of his gangrenous toe.  There is no infection seen around the gangrenous toe and so patient would not need any antibiotics at discharge  Consultants:  Cardiology, vascular surgery, podiatry Procedures performed: Angiogram Disposition: Home Diet recommendation:  Discharge Diet Orders (From admission, onward)     Start     Ordered   03/21/23 0000  Diet - low sodium heart healthy        03/21/23 1229           Cardiac diet DISCHARGE MEDICATION: Allergies as of 03/21/2023       Reactions   Haemophilus B Polysaccharide Vaccine Nausea And Vomiting, Other (See Comments)   Fever, chills, vomiting.   Influenza Virus Vaccine Other (See Comments), Nausea And Vomiting   Fever, chills, vomiting.         Medication List     TAKE these medications    acetaminophen 325 MG tablet Commonly known as: TYLENOL Take 2 tablets (650 mg total) by mouth every 6 (six) hours as needed for mild pain or fever.   clopidogrel 75 MG tablet Commonly known as: PLAVIX Take 75 mg by mouth daily.   metoprolol tartrate 25 MG tablet Commonly known as: LOPRESSOR Take tablet TWO hours prior to his cardiac CT scan.   multivitamin tablet Take 1 tablet by mouth 3 (three) times a week.   valsartan 80 MG tablet Commonly known as: DIOVAN Take 1 tablet (80 mg total) by mouth daily.        Follow-up Information     Schnier, Latina Craver, MD Follow up.   Specialties: Vascular Surgery, Cardiology, Radiology, Vascular Surgery Contact information: [address] [place] [address] [phone]  [name], DPM. Schedule an appointment as soon as possible for a visit.   Specialty: Podiatry Contact information: [address] [place] [address] [phone]                Discharge Exam: [name] Weights   03/19/23 1430 03/21/23 0500  Weight: 79.4 kg 73.8 kg   General: Not in acute distress HEENT: Atraumatic normocephalic Heme: No neck lymph node enlargement. Cardiac: S1/S2, RRR, No murmurs, No gallops or rubs. Respiratory: No rales, wheezing, rhonchi or rubs. GI: Soft, nondistended, nontender, no  rebound pain, no organomegaly, BS present. GU: No hematuria Ext: 1+DP/PT pulse bilaterally.  Right second toe gangrenous and black    Condition at discharge: good   Discharge time spent: 37 minutes  Signed: Loyce Dys, MD [facility] 03/21/2023

## 2023-03-21 NOTE — Progress Notes (Signed)
Patient discharged to home, ambulated out of unit with Care RN and wheelchair stand by, accompanied by son with all belongings.  Medications and discharge instructions reviewed.  All questions answered.  Patient agreed to follow up with appointments as listed on AVS.  PIV x 1 removed, no bleeding and intact.  Dressing to Left Femoral sight still in place, no bleeding, intact.  Patient verbalized understanding of signs and symptoms of infection.  Patient satisfied with overall care at [facility].

## 2023-03-21 NOTE — TOC CM/SW Note (Signed)
Transition of Care [facility]) - Inpatient Brief Assessment   Patient Details  Name: [name] MRN: [id number] Date of Birth: 10-07-1940  Transition of Care [facility]) CM/SW Contact:    Margarito Liner, LCSW Phone Number: 03/21/2023, 12:39 PM   Clinical Narrative: Patient has orders to discharge home today. Chart reviewed. No TOC needs identified. CSW signing off.  Transition of Care Asessment: Insurance and Status: Insurance coverage has been reviewed Patient has primary care physician: Yes Home environment has been reviewed: Single family home Prior level of function:: Not documented Prior/Current Home Services: No current home services Social Determinants of Health Reivew: SDOH reviewed no interventions necessary Readmission risk has been reviewed: Yes Transition of care needs: no transition of care needs at this time

## 2023-03-21 NOTE — Plan of Care (Signed)
Pt alert and oriented x 4. Up 1 assist. Fall risk due to gait instability for injury to foot and partial amputation on left foot. Weak pedal pulses noted. Left groin site no signs of bleeding after angiography. Rocephin currently infusing this am. Pt has received 2 doses of heparin subcut for blood clot prevention. Vitals stable.  Problem: Education: Goal: Ability to describe self-care measures that may prevent or decrease complications (Diabetes Survival Skills Education) will improve Outcome: Progressing Goal: Individualized Educational Video(s) Outcome: Progressing   Problem: Coping: Goal: Ability to adjust to condition or change in health will improve Outcome: Progressing   Problem: Fluid Volume: Goal: Ability to maintain a balanced intake and output will improve Outcome: Progressing   Problem: Health Behavior/Discharge Planning: Goal: Ability to identify and utilize available resources and services will improve Outcome: Progressing Goal: Ability to manage health-related needs will improve Outcome: Progressing   Problem: Metabolic: Goal: Ability to maintain appropriate glucose levels will improve Outcome: Progressing   Problem: Nutritional: Goal: Maintenance of adequate nutrition will improve Outcome: Progressing Goal: Progress toward achieving an optimal weight will improve Outcome: Progressing   Problem: Skin Integrity: Goal: Risk for impaired skin integrity will decrease Outcome: Progressing   Problem: Tissue Perfusion: Goal: Adequacy of tissue perfusion will improve Outcome: Progressing   Problem: Education: Goal: Knowledge of General Education information will improve Description: Including pain rating scale, medication(s)/side effects and non-pharmacologic comfort measures Outcome: Progressing   Problem: Health Behavior/Discharge Planning: Goal: Ability to manage health-related needs will improve Outcome: Progressing   Problem: Clinical Measurements: Goal:  Ability to maintain clinical measurements within normal limits will improve Outcome: Progressing Goal: Will remain free from infection Outcome: Progressing Goal: Diagnostic test results will improve Outcome: Progressing Goal: Respiratory complications will improve Outcome: Progressing Goal: Cardiovascular complication will be avoided Outcome: Progressing   Problem: Activity: Goal: Risk for activity intolerance will decrease Outcome: Progressing   Problem: Nutrition: Goal: Adequate nutrition will be maintained Outcome: Progressing   Problem: Coping: Goal: Level of anxiety will decrease Outcome: Progressing   Problem: Elimination: Goal: Will not experience complications related to bowel motility Outcome: Progressing Goal: Will not experience complications related to urinary retention Outcome: Progressing   Problem: Pain Managment: Goal: General experience of comfort will improve Outcome: Progressing   Problem: Safety: Goal: Ability to remain free from injury will improve Outcome: Progressing   Problem: Skin Integrity: Goal: Risk for impaired skin integrity will decrease Outcome: Progressing   Problem: Education: Goal: Understanding of CV disease, CV risk reduction, and recovery process will improve Outcome: Progressing Goal: Individualized Educational Video(s) Outcome: Progressing   Problem: Activity: Goal: Ability to return to baseline activity level will improve Outcome: Progressing   Problem: Cardiovascular: Goal: Ability to achieve and maintain adequate cardiovascular perfusion will improve Outcome: Progressing Goal: Vascular access site(s) Level 0-1 will be maintained Outcome: Progressing   Problem: Health Behavior/Discharge Planning: Goal: Ability to safely manage health-related needs after discharge will improve Outcome: Progressing

## 2023-03-21 NOTE — Plan of Care (Signed)

## 2023-03-21 NOTE — Care Management Important Message (Signed)
Important Message  Patient Details  Name: [name] MRN: [id number] Date of Birth: [birth year]   Medicare Important Message Given:  N/A - LOS <3 / Initial given by admissions     Olegario Messier A Akshat Minehart 03/21/2023, 10:40 AM

## 2023-03-22 ENCOUNTER — Encounter: Payer: Self-pay | Admitting: Vascular Surgery

## 2023-03-22 LAB — GLUCOSE, CAPILLARY: Glucose-Capillary: 118 mg/dL — ABNORMAL HIGH (ref 70–99)

## 2023-03-24 LAB — CULTURE, BLOOD (ROUTINE X 2): Culture: NO GROWTH

## 2023-03-26 ENCOUNTER — Ambulatory Visit: Payer: Medicare Other | Admitting: Podiatry

## 2023-03-26 VITALS — BP 110/53 | HR 73 | Temp 96.8°F

## 2023-03-26 DIAGNOSIS — I96 Gangrene, not elsewhere classified: Secondary | ICD-10-CM

## 2023-03-26 NOTE — Progress Notes (Signed)
Chief Complaint  Patient presents with   Toe Pain    Patient came in today for 2nd toe removal, toe is black, odor, temp, Nausea,     Subjective:  82 y.o. male with PMHx of diabetes mellitus presenting for outpatient follow-up of ischemia with black discoloration of the right second toe.  Patient spouse has been applying Betadine to the toe.  Originally he was admitted to the [facility] and toe amputation was recommended after vascular optimization.  He was unable to proceed with the bypass to the lower extremity secondary to lack of cardiac clearance.  Patient was subsequently discharged and cleared bicolored etiology for right leg bypass.  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)    Diabetes mellitus without complication (HCC)    Hypertension    Wears dentures    Full upper and lower (loose)    Past Surgical History:  Procedure Laterality Date   AMPUTATION TOE Left 03/28/2018   Procedure: AMPUTATION TOE/ PARTIAL RAY RESECTION-LEFT 2ND AND 3RD;  Surgeon: Linus Galas, DPM;  Location: [facility] ORS;  Service: Podiatry;  Laterality: Left;   CATARACT EXTRACTION W/PHACO Left 02/10/2019   Procedure: CATARACT EXTRACTION PHACO AND INTRAOCULAR LENS PLACEMENT (IOC)  LEFT DIABETIC;  Surgeon: Nevada Crane, MD;  Location: [facility];  Service: Ophthalmology;  Laterality: Left;  Diabetic - oral meds   CATARACT EXTRACTION W/PHACO Right 04/20/2019   Procedure: CATARACT EXTRACTION PHACO AND INTRAOCULAR LENS PLACEMENT (IOC)  RIGHT DIABETIC;  Surgeon: Nevada Crane, MD;  Location: [facility];  Service: Ophthalmology;  Laterality: Right;   HERNIA REPAIR     LOWER EXTREMITY ANGIOGRAPHY Left 03/26/2018   Procedure: Lower Extremity Angiography;  Surgeon: Renford Dills, MD;  Location: [facility] INVASIVE CV LAB;  Service: Cardiovascular;  Laterality:  Left;   LOWER EXTREMITY ANGIOGRAPHY Right 03/20/2023   Procedure: Lower Extremity Angiography;  Surgeon: Renford Dills, MD;  Location: [facility] INVASIVE CV LAB;  Service: Cardiovascular;  Laterality: Right;   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   Procedure: TRANSMETATARSAL AMPUTATION;  Surgeon: Recardo Evangelist, DPM;  Location: [facility] ORS;  Service: Podiatry;  Laterality: Left;    Allergies  Allergen Reactions   Haemophilus B Polysaccharide Vaccine Nausea And Vomiting and Other (See Comments)    Fever, chills, vomiting.   Influenza Virus Vaccine Other (See Comments) and Nausea And Vomiting    Fever, chills, vomiting.     RT second toe 03/26/2023  RT second toe 03/26/2023   Objective/Physical Exam General: The patient is alert and oriented x3 in no acute distress.  Dermatology:  Dry stable ischemic gangrene noted to the right second toe.  Overall it appears stable.  There are some slight maceration to the interdigital webspace between the first and second digits but overall it appears very stable without any indication of acute cellulitis or infection.  Vascular: S/P RLE angioplasty 03/20/2023. Dr. [name], MD, [place] [facility]  Neurological: Light touch and protective threshold diminished bilaterally.   Musculoskeletal Exam: Gangrene right second toe  Assessment: 1.  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 diabetes mellitus w/ peripheral neuropathy   Plan of Care:  -Patient evaluated -Continue Betadine wet-to-dry dressings daily to the foot.  Supplies provided -Plan to hold toe amputation surgery until right leg bypass and optimization from a vascular standpoint. -Surgical authorization for the right toe amputation was completed today.  If the patient is admitted postoperatively after the right leg bypass, we will plan to do it while inpatient. However paperwork completed today in case the toe amputation needs to be managed outpatient.  -Return to clinic  postoperatively.   Felecia Shelling, DPM [facility]  Dr. Felecia Shelling, DPM    [address] [place], [place] [address]                Office [phone]  Fax [phone]

## 2023-03-29 ENCOUNTER — Encounter: Payer: Self-pay | Admitting: Podiatry

## 2023-04-01 ENCOUNTER — Telehealth (INDEPENDENT_AMBULATORY_CARE_PROVIDER_SITE_OTHER): Payer: Self-pay

## 2023-04-01 NOTE — Telephone Encounter (Signed)
Patient's spouse left a message for a return call regarding the patient being scheduled for surgery with Dr. Gilda Crease. I contacted the patient and was given asked why the patient was not scheduled for surgery. I explained that Dr. Gilda Crease has been on vacation and will give me the details of what to schedule for the patient and at that time I will contact her to decide a date and get prior authorization. Patient's spouse stated his foot was getting worse so what does she have to wait until it comes off. I advised that if his situation has gotten worse then please take him to the ED for evaluation. The spouse stated then she would have to wait 8 hours or more. I again stated that when I received the information as to what to schedule for the patient I would call her. The spouse hung up.

## 2023-04-04 ENCOUNTER — Other Ambulatory Visit (INDEPENDENT_AMBULATORY_CARE_PROVIDER_SITE_OTHER): Payer: Self-pay | Admitting: Nurse Practitioner

## 2023-04-04 ENCOUNTER — Encounter (INDEPENDENT_AMBULATORY_CARE_PROVIDER_SITE_OTHER): Payer: Self-pay | Admitting: Vascular Surgery

## 2023-04-04 ENCOUNTER — Ambulatory Visit (INDEPENDENT_AMBULATORY_CARE_PROVIDER_SITE_OTHER): Payer: Medicare Other | Admitting: Vascular Surgery

## 2023-04-04 VITALS — BP 147/74 | HR 71 | Resp 18 | Ht 68.0 in | Wt 163.4 lb

## 2023-04-04 DIAGNOSIS — I25119 Atherosclerotic heart disease of native coronary artery with unspecified angina pectoris: Secondary | ICD-10-CM

## 2023-04-04 DIAGNOSIS — I10 Essential (primary) hypertension: Secondary | ICD-10-CM

## 2023-04-04 DIAGNOSIS — I7025 Atherosclerosis of native arteries of other extremities with ulceration: Secondary | ICD-10-CM

## 2023-04-04 DIAGNOSIS — E1152 Type 2 diabetes mellitus with diabetic peripheral angiopathy with gangrene: Secondary | ICD-10-CM | POA: Diagnosis not present

## 2023-04-04 DIAGNOSIS — N1831 Chronic kidney disease, stage 3a: Secondary | ICD-10-CM

## 2023-04-04 NOTE — Progress Notes (Unsigned)
MRN : [id number]  KAYDEN HUTMACHER is a 82 y.o. (Feb 15, 1941) male who presents with chief complaint of check circulation.  History of Present Illness:   The patient returns to the office for followup and review status post angiogram with intervention on 03/20/2023.   Procedure:  Percutaneous transluminal angioplasty right superficial femoral artery   The patient notes no change in the lower extremity symptoms. No interval shortening of the patient's claudication distance or rest pain symptoms. No new ulcers or wounds have occurred since the last visit.  There have been no significant changes to the patient's overall health care.  No documented history of amaurosis fugax or recent TIA symptoms. There are no recent neurological changes noted. No documented history of DVT, PE or superficial thrombophlebitis. The patient denies recent episodes of angina or shortness of breath.    No outpatient medications have been marked as taking for the 04/04/23 encounter (Appointment) with Gilda Crease, Latina Craver, MD.    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;  Service: Ophthalmology;  Laterality: Left;  Diabetic - oral meds   CATARACT EXTRACTION W/PHACO Right 04/20/2019   Procedure: CATARACT EXTRACTION PHACO  AND INTRAOCULAR LENS PLACEMENT (IOC)  RIGHT DIABETIC;  Surgeon: Nevada Crane, MD;  Location: [facility];  Service: Ophthalmology;  Laterality: Right;   HERNIA REPAIR     LOWER EXTREMITY ANGIOGRAPHY Left 03/26/2018   Procedure: Lower Extremity Angiography;  Surgeon: Renford Dills, MD;  Location: [facility];  Service: Cardiovascular;  Laterality: Left;   LOWER EXTREMITY ANGIOGRAPHY Right 03/20/2023   Procedure: Lower Extremity Angiography;  Surgeon: Renford Dills, MD;  Location: [facility];  Service: Cardiovascular;  Laterality: Right;   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   Procedure: TRANSMETATARSAL AMPUTATION;  Surgeon: Recardo Evangelist, DPM;  Location: [facility];  Service: Podiatry;  Laterality: Left;    Social History Social History   Tobacco Use   Smoking status: Former    Current packs/day: 0.00    Types: Cigarettes    Quit date: 1985    Years since quitting: 39.5   Smokeless tobacco: Never  Vaping Use   Vaping status: Never Used  Substance Use Topics   Alcohol use: Never    Comment: rare - couple times/yr   Drug use: Never    Family History Family History  Problem Relation Age of Onset   Diabetes Mother    Cancer Father    Hypertension Maternal  Grandfather    Heart attack Maternal Grandfather     Allergies  Allergen Reactions   Haemophilus B Polysaccharide Vaccine Nausea And Vomiting and Other (See Comments)    Fever, chills, vomiting.   Influenza Virus Vaccine Other (See Comments) and Nausea And Vomiting    Fever, chills, vomiting.      REVIEW OF SYSTEMS (Negative unless checked)  Constitutional: [] Weight loss  [] Fever  [] Chills Cardiac: [] Chest pain   [] Chest pressure   [] Palpitations   [] Shortness of breath when laying flat   [] Shortness of breath with exertion. Vascular:  [x] Pain in legs with walking   [] Pain in legs at rest  [] History of DVT   [] Phlebitis   [] Swelling in legs   [] Varicose veins   [] Non-healing  ulcers Pulmonary:   [] Uses home oxygen   [] Productive cough   [] Hemoptysis   [] Wheeze  [] COPD   [] Asthma Neurologic:  [] Dizziness   [] Seizures   [] History of stroke   [] History of TIA  [] Aphasia   [] Vissual changes   [] Weakness or numbness in arm   [] Weakness or numbness in leg Musculoskeletal:   [] Joint swelling   [] Joint pain   [] Low back pain Hematologic:  [] Easy bruising  [] Easy bleeding   [] Hypercoagulable state   [] Anemic Gastrointestinal:  [] Diarrhea   [] Vomiting  [] Gastroesophageal reflux/heartburn   [] Difficulty swallowing. Genitourinary:  [] Chronic kidney disease   [] Difficult urination  [] Frequent urination   [] Blood in urine Skin:  [] Rashes   [] Ulcers  Psychological:  [] History of anxiety   []  History of major depression.  Physical Examination  There were no vitals filed for this visit. There is no height or weight on file to calculate BMI. Gen: WD/WN, NAD Head: [place]/AT, No temporalis wasting.  Ear/Nose/Throat: Hearing grossly intact, nares w/o erythema or drainage Eyes: PER, EOMI, sclera nonicteric.  Neck: Supple, no masses.  No bruit or JVD.  Pulmonary:  Good air movement, no audible wheezing, no use of accessory muscles.  Cardiac: RRR, normal S1, S2, no Murmurs. Vascular:  mild trophic changes, no open wounds Vessel Right Left  Radial Palpable Palpable  PT Not Palpable Not Palpable  DP Not Palpable Not Palpable  Gastrointestinal: soft, non-distended. No guarding/no peritoneal signs.  Musculoskeletal: M/S 5/5 throughout.  No visible deformity.  Neurologic: CN 2-12 intact. Pain and light touch intact in extremities.  Symmetrical.  Speech is fluent. Motor exam as listed above. Psychiatric: Judgment intact, Mood & affect appropriate for pt's clinical situation. Dermatologic: No rashes or ulcers noted.  No changes consistent with cellulitis.   CBC Lab Results  Component Value Date   WBC 8.1 03/21/2023   HGB 10.5 (L) 03/21/2023   HCT 31.4 (L) 03/21/2023   MCV 90.5  03/21/2023   PLT 179 03/21/2023    BMET    Component Value Date/Time   NA 134 (L) 03/20/2023 1823   K 4.5 03/20/2023 1823   CL 106 03/20/2023 1823   CO2 18 (L) 03/20/2023 1823   GLUCOSE 130 (H) 03/20/2023 1823   BUN 23 03/20/2023 1823   CREATININE 1.08 03/20/2023 1823   CALCIUM 8.6 (L) 03/20/2023 1823   GFRNONAA >60 03/20/2023 1823   GFRAA >60 07/30/2018 1045   CrCl cannot be calculated (Unknown ideal weight.).  COAG Lab Results  Component Value Date   INR 1.2 03/19/2023   INR 1.25 07/18/2018    Radiology PERIPHERAL VASCULAR CATHETERIZATION  Result Date: 03/20/2023 See surgical note for result.  DG Foot Complete Right  Result Date: 03/19/2023 CLINICAL DATA:  Infection, patient appears to have a black necrotic toe EXAM: RIGHT FOOT COMPLETE - 3+ VIEW COMPARISON:  04/04/2006 FINDINGS: Visualization of the fourth digit is somewhat limited by overlapping hammertoe deformity. Within this limitation, there appears to be gross bony destruction of the distal digit and diffuse edema of the digit. Some degree of this bony deformity appears chronic. No other fracture or dislocation. Joint spaces are well preserved. Diffuse soft tissue edema about the distal forefoot. Calcification of the plantar fascia. IMPRESSION: 1. Visualization of the fourth digit is somewhat limited by overlapping hammertoe deformity. Within this limitation, there appears to be gross bony destruction of the distal digit and diffuse edema of the digit. Some degree of this bony deformity appears chronic. This may reflect osteomyelitis or gangrene acutely superimposed upon a chronic fracture or other abnormality given stated clinical concern. Consider MRI to further evaluate. 2. Diffuse soft tissue edema about the distal forefoot. Electronically Signed   By: Jearld Lesch M.D.   On: 03/19/2023 15:36   DG Chest 2 View  Result Date: 03/19/2023 CLINICAL DATA:  Suspected Sepsis EXAM: CHEST - 2 VIEW COMPARISON:  CT 09/20/2022  FINDINGS: Low lung volumes.  No confluent airspace disease or overt edema. Heart size and mediastinal contours are within normal limits. Aortic Atherosclerosis (ICD10-170.0). No effusion. Visualized bones unremarkable. IMPRESSION: Low volumes. No acute findings. Electronically Signed   By: Corlis Leak M.D.   On: 03/19/2023 15:34     Assessment/Plan 1. Atherosclerosis of native arteries of the extremities with ulceration (HCC)  Recommend:  The patient has evidence of severe atherosclerotic changes of both lower extremities associated with ulceration and tissue loss of the right foot.  This represents a limb threatening ischemia and places the patient at a high risk for limb loss.  Angiography has been performed and the situation is not ideal for intervention.  Given this finding open surgical repair is recommended.   Patient should undergo arterial reconstruction, popliteal to tibial bypass using reversed great saphenous vein of the right lower extremity with the hope for limb salvage.  The risks and benefits as well as the alternative therapies was discussed in detail with the patient.  All questions were answered.  Patient agrees to proceed with open vascular surgical reconstruction.  The patient will follow up with me in the office after the procedure.    2. Coronary artery disease involving native coronary artery of native heart with angina pectoris (HCC) Continue cardiac and antihypertensive medications as already ordered and reviewed, no changes at this time.  Continue statin as ordered and reviewed, no changes at this time  Nitrates PRN for chest pain  3. Essential hypertension Continue antihypertensive medications as already ordered, these medications have been reviewed and there are no changes at this time.  4. Type 2 diabetes mellitus with diabetic peripheral angiopathy and gangrene, without long-term current use of insulin (HCC) Continue hypoglycemic medications as already  ordered, these medications have been reviewed and there are no changes at this time.  Hgb A1C to be monitored as already arranged by primary service  5. Chronic kidney disease, stage 3a (HCC) The patient has advanced renal disease.  However, at the present time the patient is not yet on dialysis.  Avoid nephrotoxic medications and dehydration.  Further plans per nephrology   [name], MD  04/04/2023 10:15 AM

## 2023-04-04 NOTE — H&P (View-Only) (Signed)
MRN : [id number]  [name] is a 82 y.o. (1941-04-07) male who presents with chief complaint of check circulation.  History of Present Illness:   The patient returns to the office for followup and review status post angiogram with intervention on 03/20/2023.   Procedure:  Percutaneous transluminal angioplasty right superficial femoral artery   The patient notes no change in the lower extremity symptoms. No interval shortening of the patient's claudication distance or rest pain symptoms. No new ulcers or wounds have occurred since the last visit.  There have been no significant changes to the patient's overall health care.  No documented history of amaurosis fugax or recent TIA symptoms. There are no recent neurological changes noted. No documented history of DVT, PE or superficial thrombophlebitis. The patient denies recent episodes of angina or shortness of breath.    No outpatient medications have been marked as taking for the 04/04/23 encounter (Appointment) with Gilda Crease, Latina Craver, MD.    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)    Diabetes mellitus without complication (HCC)    Hypertension    Wears dentures    Full upper and lower (loose)    Past Surgical History:  Procedure Laterality Date   AMPUTATION TOE Left 03/28/2018   Procedure: AMPUTATION TOE/ PARTIAL RAY RESECTION-LEFT 2ND AND 3RD;  Surgeon: Linus Galas, DPM;  Location: [facility];  Service: Podiatry;  Laterality: Left;   CATARACT EXTRACTION W/PHACO Left 02/10/2019   Procedure: CATARACT EXTRACTION PHACO AND INTRAOCULAR LENS PLACEMENT (IOC)  LEFT DIABETIC;  Surgeon: Nevada Crane, MD;  Location: [facility];  Service: Ophthalmology;  Laterality: Left;  Diabetic - oral meds   CATARACT EXTRACTION W/PHACO Right 04/20/2019   Procedure: CATARACT EXTRACTION PHACO  AND INTRAOCULAR LENS PLACEMENT (IOC)  RIGHT DIABETIC;  Surgeon: Nevada Crane, MD;  Location: [facility];  Service: Ophthalmology;  Laterality: Right;   HERNIA REPAIR     LOWER EXTREMITY ANGIOGRAPHY Left 03/26/2018   Procedure: Lower Extremity Angiography;  Surgeon: Renford Dills, MD;  Location: [facility];  Service: Cardiovascular;  Laterality: Left;   LOWER EXTREMITY ANGIOGRAPHY Right 03/20/2023   Procedure: Lower Extremity Angiography;  Surgeon: Renford Dills, MD;  Location: [facility];  Service: Cardiovascular;  Laterality: Right;   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   Procedure: TRANSMETATARSAL AMPUTATION;  Surgeon: Recardo Evangelist, DPM;  Location: [facility];  Service: Podiatry;  Laterality: Left;    Social History Social History   Tobacco Use   Smoking status: Former    Current packs/day: 0.00    Types: Cigarettes    Quit date: 1985    Years since quitting: 39.5   Smokeless tobacco: Never  Vaping Use   Vaping status: Never Used  Substance Use Topics   Alcohol use: Never    Comment: rare - couple times/yr   Drug use: Never    Family History Family History  Problem Relation Age of Onset   Diabetes Mother    Cancer Father    Hypertension Maternal  Grandfather    Heart attack Maternal Grandfather     Allergies  Allergen Reactions   Haemophilus B Polysaccharide Vaccine Nausea And Vomiting and Other (See Comments)    Fever, chills, vomiting.   Influenza Virus Vaccine Other (See Comments) and Nausea And Vomiting    Fever, chills, vomiting.      REVIEW OF SYSTEMS (Negative unless checked)  Constitutional: [] Weight loss  [] Fever  [] Chills Cardiac: [] Chest pain   [] Chest pressure   [] Palpitations   [] Shortness of breath when laying flat   [] Shortness of breath with exertion. Vascular:  [x] Pain in legs with walking   [] Pain in legs at rest  [] History of DVT   [] Phlebitis   [] Swelling in legs   [] Varicose veins   [] Non-healing  ulcers Pulmonary:   [] Uses home oxygen   [] Productive cough   [] Hemoptysis   [] Wheeze  [] COPD   [] Asthma Neurologic:  [] Dizziness   [] Seizures   [] History of stroke   [] History of TIA  [] Aphasia   [] Vissual changes   [] Weakness or numbness in arm   [] Weakness or numbness in leg Musculoskeletal:   [] Joint swelling   [] Joint pain   [] Low back pain Hematologic:  [] Easy bruising  [] Easy bleeding   [] Hypercoagulable state   [] Anemic Gastrointestinal:  [] Diarrhea   [] Vomiting  [] Gastroesophageal reflux/heartburn   [] Difficulty swallowing. Genitourinary:  [] Chronic kidney disease   [] Difficult urination  [] Frequent urination   [] Blood in urine Skin:  [] Rashes   [] Ulcers  Psychological:  [] History of anxiety   []  History of major depression.  Physical Examination  There were no vitals filed for this visit. There is no height or weight on file to calculate BMI. Gen: WD/WN, NAD Head: [place]/AT, No temporalis wasting.  Ear/Nose/Throat: Hearing grossly intact, nares w/o erythema or drainage Eyes: PER, EOMI, sclera nonicteric.  Neck: Supple, no masses.  No bruit or JVD.  Pulmonary:  Good air movement, no audible wheezing, no use of accessory muscles.  Cardiac: RRR, normal S1, S2, no Murmurs. Vascular:  mild trophic changes, no open wounds Vessel Right Left  Radial Palpable Palpable  PT Not Palpable Not Palpable  DP Not Palpable Not Palpable  Gastrointestinal: soft, non-distended. No guarding/no peritoneal signs.  Musculoskeletal: M/S 5/5 throughout.  No visible deformity.  Neurologic: CN 2-12 intact. Pain and light touch intact in extremities.  Symmetrical.  Speech is fluent. Motor exam as listed above. Psychiatric: Judgment intact, Mood & affect appropriate for pt's clinical situation. Dermatologic: No rashes or ulcers noted.  No changes consistent with cellulitis.   CBC Lab Results  Component Value Date   WBC 8.1 03/21/2023   HGB 10.5 (L) 03/21/2023   HCT 31.4 (L) 03/21/2023   MCV 90.5  03/21/2023   PLT 179 03/21/2023    BMET    Component Value Date/Time   NA 134 (L) 03/20/2023 1823   K 4.5 03/20/2023 1823   CL 106 03/20/2023 1823   CO2 18 (L) 03/20/2023 1823   GLUCOSE 130 (H) 03/20/2023 1823   BUN 23 03/20/2023 1823   CREATININE 1.08 03/20/2023 1823   CALCIUM 8.6 (L) 03/20/2023 1823   GFRNONAA >60 03/20/2023 1823   GFRAA >60 07/30/2018 1045   CrCl cannot be calculated (Unknown ideal weight.).  COAG Lab Results  Component Value Date   INR 1.2 03/19/2023   INR 1.25 07/18/2018    Radiology PERIPHERAL VASCULAR CATHETERIZATION  Result Date: 03/20/2023 See surgical note for result.  DG Foot Complete Right  Result Date: 03/19/2023 CLINICAL DATA:  Infection, patient appears to have a black necrotic toe EXAM: RIGHT FOOT COMPLETE - 3+ VIEW COMPARISON:  04/04/2006 FINDINGS: Visualization of the fourth digit is somewhat limited by overlapping hammertoe deformity. Within this limitation, there appears to be gross bony destruction of the distal digit and diffuse edema of the digit. Some degree of this bony deformity appears chronic. No other fracture or dislocation. Joint spaces are well preserved. Diffuse soft tissue edema about the distal forefoot. Calcification of the plantar fascia. IMPRESSION: 1. Visualization of the fourth digit is somewhat limited by overlapping hammertoe deformity. Within this limitation, there appears to be gross bony destruction of the distal digit and diffuse edema of the digit. Some degree of this bony deformity appears chronic. This may reflect osteomyelitis or gangrene acutely superimposed upon a chronic fracture or other abnormality given stated clinical concern. Consider MRI to further evaluate. 2. Diffuse soft tissue edema about the distal forefoot. Electronically Signed   By: Jearld Lesch M.D.   On: 03/19/2023 15:36   DG Chest 2 View  Result Date: 03/19/2023 CLINICAL DATA:  Suspected Sepsis EXAM: CHEST - 2 VIEW COMPARISON:  CT 09/20/2022  FINDINGS: Low lung volumes.  No confluent airspace disease or overt edema. Heart size and mediastinal contours are within normal limits. Aortic Atherosclerosis (ICD10-170.0). No effusion. Visualized bones unremarkable. IMPRESSION: Low volumes. No acute findings. Electronically Signed   By: Corlis Leak M.D.   On: 03/19/2023 15:34     Assessment/Plan 1. Atherosclerosis of native arteries of the extremities with ulceration (HCC)  Recommend:  The patient has evidence of severe atherosclerotic changes of both lower extremities associated with ulceration and tissue loss of the right foot.  This represents a limb threatening ischemia and places the patient at a high risk for limb loss.  Angiography has been performed and the situation is not ideal for intervention.  Given this finding open surgical repair is recommended.   Patient should undergo arterial reconstruction, popliteal to tibial bypass using reversed great saphenous vein of the right lower extremity with the hope for limb salvage.  The risks and benefits as well as the alternative therapies was discussed in detail with the patient.  All questions were answered.  Patient agrees to proceed with open vascular surgical reconstruction.  The patient will follow up with me in the office after the procedure.    2. Coronary artery disease involving native coronary artery of native heart with angina pectoris (HCC) Continue cardiac and antihypertensive medications as already ordered and reviewed, no changes at this time.  Continue statin as ordered and reviewed, no changes at this time  Nitrates PRN for chest pain  3. Essential hypertension Continue antihypertensive medications as already ordered, these medications have been reviewed and there are no changes at this time.  4. Type 2 diabetes mellitus with diabetic peripheral angiopathy and gangrene, without long-term current use of insulin (HCC) Continue hypoglycemic medications as already  ordered, these medications have been reviewed and there are no changes at this time.  Hgb A1C to be monitored as already arranged by primary service  5. Chronic kidney disease, stage 3a (HCC) The patient has advanced renal disease.  However, at the present time the patient is not yet on dialysis.  Avoid nephrotoxic medications and dehydration.  Further plans per nephrology   [name], MD  04/04/2023 10:15 AM

## 2023-04-04 NOTE — Telephone Encounter (Signed)
I attempted to contact the patient's spouse to discuss the patient's upcoming pre-op appt and surgery. A message was left for a return call.

## 2023-04-04 NOTE — Telephone Encounter (Signed)
Patient was seen in office and was handed paperwork for a popliteal to peroneal bypass with Dr. Gilda Crease at the [facility]. Pre-op is on 04/05/23 at 11:00 am at the [facility]. Pre-surgery instructions were discussed and handed to the patient's spouse.

## 2023-04-05 ENCOUNTER — Other Ambulatory Visit: Payer: Self-pay

## 2023-04-05 ENCOUNTER — Encounter
Admission: RE | Admit: 2023-04-05 | Discharge: 2023-04-05 | Disposition: A | Discharge status: Home / Self Care | Payer: Medicare Other | Source: Ambulatory Visit | Attending: Vascular Surgery | Admitting: Vascular Surgery

## 2023-04-05 DIAGNOSIS — Z01812 Encounter for preprocedural laboratory examination: Secondary | ICD-10-CM | POA: Insufficient documentation

## 2023-04-05 DIAGNOSIS — I7025 Atherosclerosis of native arteries of other extremities with ulceration: Secondary | ICD-10-CM | POA: Insufficient documentation

## 2023-04-05 HISTORY — DX: Polyneuropathy, unspecified: G62.9

## 2023-04-05 HISTORY — DX: Cardiac murmur, unspecified: R01.1

## 2023-04-05 HISTORY — DX: Dizziness and giddiness: R42

## 2023-04-05 HISTORY — DX: Chest pain, unspecified: R07.9

## 2023-04-05 LAB — CBC WITH DIFFERENTIAL/PLATELET
Abs Immature Granulocytes: 0.03 10*3/uL (ref 0.00–0.07)
Basophils Absolute: 0.1 10*3/uL (ref 0.0–0.1)
Basophils Relative: 1 %
Eosinophils Absolute: 0.4 10*3/uL (ref 0.0–0.5)
Eosinophils Relative: 5 %
HCT: 32.7 % — ABNORMAL LOW (ref 39.0–52.0)
Hemoglobin: 11.2 g/dL — ABNORMAL LOW (ref 13.0–17.0)
Immature Granulocytes: 0 %
Lymphocytes Relative: 28 %
Lymphs Abs: 2.3 10*3/uL (ref 0.7–4.0)
MCH: 30.7 pg (ref 26.0–34.0)
MCHC: 34.3 g/dL (ref 30.0–36.0)
MCV: 89.6 fL (ref 80.0–100.0)
Monocytes Absolute: 0.6 10*3/uL (ref 0.1–1.0)
Monocytes Relative: 8 %
Neutro Abs: 4.8 10*3/uL (ref 1.7–7.7)
Neutrophils Relative %: 58 %
Platelets: 202 10*3/uL (ref 150–400)
RBC: 3.65 MIL/uL — ABNORMAL LOW (ref 4.22–5.81)
RDW: 12.9 % (ref 11.5–15.5)
WBC: 8.3 10*3/uL (ref 4.0–10.5)
nRBC: 0 % (ref 0.0–0.2)

## 2023-04-05 LAB — TYPE AND SCREEN
ABO/RH(D): A POS
Antibody Screen: NEGATIVE

## 2023-04-05 LAB — BASIC METABOLIC PANEL
Anion gap: 8 (ref 5–15)
BUN: 25 mg/dL — ABNORMAL HIGH (ref 8–23)
CO2: 25 mmol/L (ref 22–32)
Calcium: 8.8 mg/dL — ABNORMAL LOW (ref 8.9–10.3)
Chloride: 105 mmol/L (ref 98–111)
Creatinine, Ser: 1.19 mg/dL (ref 0.61–1.24)
GFR, Estimated: >60 mL/min (ref >60)
Glucose, Bld: 131 mg/dL — ABNORMAL HIGH (ref 70–99)
Potassium: 3.9 mmol/L (ref 3.5–5.1)
Sodium: 138 mmol/L (ref 135–145)

## 2023-04-05 NOTE — Patient Instructions (Addendum)
Your procedure is scheduled on:  04/11/2023  Report to the Registration Desk on the 1st floor of the [facility]. To find out your arrival time, please call [phone] between 1PM - 3PM on: 7/24/ 2024  If your arrival time is 6:00 am, do not arrive before that time as the [facility] entrance doors do not open until 6:00 am.  REMEMBER: Instructions that are not followed completely may result in serious medical risk, up to and including death; or upon the discretion of your surgeon and anesthesiologist your surgery may need to be rescheduled.  Do not eat food after midnight the night before surgery.  No gum chewing or hard candies.   One week prior to surgery: Stop Anti-inflammatories (NSAIDS) such as Advil, Aleve, Ibuprofen, Motrin, Naproxen, Naprosyn and Aspirin based products such as Excedrin, Goody's Powder, BC Powder. Stop ANY OVER THE COUNTER supplements until after surgery. You may however, continue to take Tylenol if needed for pain up until the day of surgery.  Continue taking all prescribed medications with the exception of the following:      Plavix- do not take it on day of surgery        Follow recommendations from Cardiologist or PCP regarding stopping blood thinners.    No Alcohol for 24 hours before or after surgery.  No Smoking including e-cigarettes for 24 hours before surgery.  No chewable tobacco products for at least 6 hours before surgery.  No nicotine patches on the day of surgery.  Do not use any "recreational" drugs for at least a week (preferably 2 weeks) before your surgery.  Please be advised that the combination of cocaine and anesthesia may have negative outcomes, up to and including death. If you test positive for cocaine, your surgery will be cancelled.  On the morning of surgery brush your teeth with toothpaste and water, you may rinse your mouth with mouthwash if you wish. Do not swallow any toothpaste or mouthwash.  Use CHG Soap or wipes as  directed on instruction sheet- provided for you   Do not wear jewelry, make-up, hairpins, clips or nail polish.  Do not wear lotions, powders, or perfumes.   Do not shave body hair from the neck down 48 hours before surgery.  Contact lenses, hearing aids and dentures may not be worn into surgery.  Do not bring valuables to the hospital. [facility] is not responsible for any missing/lost belongings or valuables.    Notify your doctor if there is any change in your medical condition (cold, fever, infection).  Wear comfortable clothing (specific to your surgery type) to the hospital.  After surgery, you can help prevent lung complications by doing breathing exercises.  If you are being admitted to the hospital overnight, leave your suitcase in the car. After surgery it may be brought to your room.  In case of increased patient census, it may be necessary for you, the patient, to continue your postoperative care in the Same Day Surgery department.  If you are being discharged the day of surgery, you will not be allowed to drive home. You will need a responsible individual to drive you home and stay with you for 24 hours after surgery.    Please call the Pre-admissions Testing Dept. at [phone] if you have any questions about these instructions.  Surgery Visitation Policy:  Patients having surgery or a procedure may have two visitors.  Children under the age of 62 must have an adult with them who is  not the patient.

## 2023-04-08 ENCOUNTER — Inpatient Hospital Stay: Admission: RE | Admit: 2023-04-08 | Payer: Medicare Other | Source: Ambulatory Visit

## 2023-04-08 ENCOUNTER — Encounter: Payer: Self-pay | Admitting: Vascular Surgery

## 2023-04-08 NOTE — Progress Notes (Signed)
Perioperative / Anesthesia Services  Pre-Admission Testing Clinical Review / Preoperative Anesthesia Consult  Date: 04/08/23  Patient Demographics:  Name: [name] DOB:   29-May-1941 MRN:   [id number]  Planned Surgical Procedure(s):    Case: [id number] Date/Time: 04/11/23 1136   Procedures:      BYPASS GRAFT FEMORAL-TIBIAL ARTERY ( POPLITEAL TO PERONEAL) (Right)     APPLICATION OF CELL SAVER (Right)   Anesthesia type: General   Pre-op diagnosis: ASO WITH GANGRENE   Location: [facility] OR ROOM 08 / [facility]   Surgeons: Renford Dills, MD     NOTE: Available PAT nursing documentation and vital signs have been reviewed. Clinical nursing staff has updated patient's PMH/PSHx, current medication list, and drug allergies/intolerances to ensure comprehensive history available to assist in medical decision making as it pertains to the aforementioned surgical procedure and anticipated anesthetic course. Extensive review of available clinical information personally performed. [place] [facility] and PSHx updated with any diagnoses/procedures that  may have been inadvertently omitted during his intake with the pre-admission testing department's nursing staff.  Clinical Discussion:  [name] is a 82 y.o. male who is submitted for pre-surgical anesthesia review and clearance prior to him undergoing the above procedure.Patient is a Former Smoker (quit 09/1983). Pertinent PMH includes: CAD, HFpEF, aortic atherosclerosis, PVD with intermittent claudication, BILATERAL carotid artery disease, ascending aorta dilatation, cardiac murmur, angina, IRBBB, palpitations, HTN, HLD, T2DM, CKD-III, neuropathy, gangrene of toe on RIGHT foot.   Patient is followed by cardiology Charles Pares, MD). He was last seen in the cardiology clinic on 03/29/2023; notes reviewed. At the time of his clinic visit, patient doing well overall from a cardiovascular perspective.  Patient was experiencing some  mild shortness of breath and palpitations, both of which were reported to be stable and at baseline. Patient denied any chest pain, PND, orthopnea, significant peripheral edema, weakness, fatigue, vertiginous symptoms, or presyncope/syncope. Patient with a past medical history significant for cardiovascular diagnoses. Documented physical exam was grossly benign, providing no evidence of acute exacerbation and/or decompensation of the patient's known cardiovascular conditions.  Patient with a history of significant PVD.  He underwent vascular intervention on 03/26/2018, at which time crosser atherectomy was performed to the LEFT posterior tibial and peroneal arteries.  Additionally PTA of the LEFT posterior tibial, SFA, and popliteal arteries was performed.  BILATERAL carotid artery Doppler study performed on 08/22/2022 revealed a <50% stenosis in the LICA with a contralateral 50-69% stenosis and the RICA.   Most recent TTE was performed on 08/27/2022 revealing a normal left ventricular systolic function with an EF of >55%.  There was mild asymmetric LVH.  There were no regional wall motion abnormalities. Left ventricular diastolic Doppler parameters consistent with abnormal relaxation (G1DD).  GLS -17.9%.  Left atrium mildly enlarged.  There was trivial aortic and pulmonary, in addition to mild mitral and tricuspid valve regurgitation.  Mild aortic valve stenosis noted with a mean pressure gradient of 19.9 mmHg; AVA (VTI) = 1.1 cm.  Aortic root mildly dilated.   Coronary CTA performed on 09/21/2022 revealed an elevated coronary calcium score of 591.  This placed patient in the 57th percentile for age/sex, race matched control.  Patient with normal coronary origin and RIGHT-sided dominance.  Study suggested a mild (25-49%) stenosis of the proximal RCA and LAD and a minimal (<25%) stenosis of the proximal LCx.  Patient underwent PTA of the RIGHT SFA and popliteal arteries on 03/20/2023.  Given his PVD,  patient is  on daily antithrombotic therapy using clopidogrel.  He is reportedly compliant with therapy with no evidence or reports of GI bleeding.  Blood pressure uncontrolled at 162/78 mmHg on currently prescribed beta-blocker (metoprolol tartrate) and ARB (valsartan) therapies.  Patient is not currently on any type of lipid-lowering therapies for his HLD diagnosis and ASCVD prevention. T2DM patient is well-controlled on currently prescribed regimen; last HgbA1c was 6.3% when checked on 01/10/2023. Patient does not have an OSAH diagnosis. Functional capacity is somewhat limited by patient's age and multiple medical comorbidities, including his PVD with claudication.  With that said, patient is able to complete all of his ADL/IADLs without cardiovascular limitation.  Per the [facility], patient is able to achieve at least 4 METS of physical activity without experiencing any significant degree of angina/anginal equivalent symptoms.  No changes were made to his medication regimen.  [name] is scheduled for an BYPASS GRAFT FEMORAL-TIBIAL ARTERY (POPLITEAL TO PERONEAL) (Right) on 04/11/2023 with Dr. Earl Lites severe, MD.  Given patient's past medical history significant for cardiovascular diagnoses, presurgical cardiac clearance was sought by the PAT team.  Per cardiology, "this patient is optimized for surgery and may proceed with the planned procedural course with a MODERATE risk of significant perioperative cardiovascular complications".  In review of his medication reconciliation, it is noted that patient is currently on prescribed daily antithrombotic therapy.  Given patient's extensive PVD, and per standing orders from vascular surgery for this procedure, patient has been instructed on recommendations for continuing his clopidogrel throughout his perioperative course.  Patient denies previous perioperative complications with anesthesia in the past. In review of the available records, it is noted that  patient underwent a MAC insulin anesthetic course at [facility] (ASA II) in 04/2019 without documented complications.      04/05/2023   11:00 AM 04/04/2023   10:19 AM 03/26/2023   11:44 AM  Vitals with BMI  Height 5\' 8"  5\' 8"    Weight 164 lbs 6 oz 163 lbs 6 oz   BMI 25 24.85   Systolic 129 147 161  Diastolic 58 74 53  Pulse  71 73    Providers/Specialists:   NOTE: Primary physician provider listed below. Patient may have been seen by APP or partner within same practice.   PROVIDER ROLE / SPECIALTY LAST OV  Schnier, Latina Craver, MD Vascular Surgery (Surgeon) 04/04/2023  Wilford Corner, PA-C Primary Care Provider 03/19/2023  Rudean Hitt, MD Cardiology 03/29/2023   Allergies:  Haemophilus b polysaccharide vaccine and Influenza virus vaccine  Current Home Medications:   No current facility-administered medications for this encounter.    acetaminophen (TYLENOL) 325 MG tablet   clopidogrel (PLAVIX) 75 MG tablet   metoprolol tartrate (LOPRESSOR) 25 MG tablet   Multiple Vitamin (MULTIVITAMIN) tablet   valsartan (DIOVAN) 80 MG tablet   History:   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   a.) TTE 08/27/2022: Ao sinus 4.1, asc Ao 3.7, ST junction 3.3   Atherosclerotic PVD with intermittent claudication (HCC)    a.) s/p vascular intervention 03/26/2018:  post tibial and peroneal crosser athrectomy, PTA LEFT post tibial, SFA, and popliteal arteries; b.) s/p PTA 03/20/2023: RIGHT SFA and popliteal arteries   Bilateral carotid artery disease (HCC) 08/22/2022   a.) carotid doppler 08/22/2022: <50 LICA, 50-69% RICA   Blurry vision, bilateral    CAD (coronary artery disease) 09/21/2022  a.) cCTA 09/21/2022: 591 (57 %ile for age/sex/race matched control)   Chest pain    Chronic heart failure with preserved ejection fraction (HFpEF) (HCC)     a.) TTE 08/27/2022: EF >55%, aym LVH, mild LAE, triv AR/PR, mild MR/TR. mild AS, Ao root dilatation, G1DD   CKD (chronic kidney disease), stage III (HCC)    Gangrene of toe of right foot (HCC)    a.) RIGHT second toe   Heart murmur    History of bilateral cataract extraction 2020   HLD (hyperlipidemia)    Hypertension    Incomplete right bundle branch block (RBBB)    Long term current use of antithrombotics/antiplatelets    a.) clopidogrel   Neuropathy    Palpitations    T2DM (type 2 diabetes mellitus) (HCC)    Vertigo    Wears dentures    Full upper and lower (loose)   Past Surgical History:  Procedure Laterality Date   AMPUTATION TOE Left 03/28/2018   Procedure: AMPUTATION TOE/ PARTIAL RAY RESECTION-LEFT 2ND AND 3RD;  Surgeon: Linus Galas, DPM;  Location: [facility] ORS;  Service: Podiatry;  Laterality: Left;   CATARACT EXTRACTION W/PHACO Left 02/10/2019   Procedure: CATARACT EXTRACTION PHACO AND INTRAOCULAR LENS PLACEMENT (IOC)  LEFT DIABETIC;  Surgeon: Nevada Crane, MD;  Location: [facility];  Service: Ophthalmology;  Laterality: Left;  Diabetic - oral meds   CATARACT EXTRACTION W/PHACO Right 04/20/2019   Procedure: CATARACT EXTRACTION PHACO AND INTRAOCULAR LENS PLACEMENT (IOC)  RIGHT DIABETIC;  Surgeon: Nevada Crane, MD;  Location: [facility];  Service: Ophthalmology;  Laterality: Right;   HERNIA REPAIR     LOWER EXTREMITY ANGIOGRAPHY Left 03/26/2018   Procedure: Lower Extremity Angiography;  Surgeon: Renford Dills, MD;  Location: [facility] INVASIVE CV [facility];  Service: Cardiovascular;  Laterality: Left;   LOWER EXTREMITY ANGIOGRAPHY Right 03/20/2023   Procedure: Lower Extremity Angiography;  Surgeon: Renford Dills, MD;  Location: [facility] INV[facility];  Service: Cardiovascular;  Laterality: Right;   Percutaneous transluminal angioplasty right superficial femoral artery      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   Procedure: TRANSMETATARSAL  AMPUTATION;  Surgeon: Recardo Evangelist, DPM;  Location: [facility] ORS;  Service: Podiatry;  Laterality: Left;   Family History  Problem Relation Age of Onset   Diabetes Mother    Cancer Father    Hypertension Maternal Grandfather    Heart attack Maternal Grandfather    Social History   Tobacco Use   Smoking status: Former    Current packs/day: 0.00    Types: Cigarettes    Quit date: 1985    Years since quitting: 39.5   Smokeless tobacco: Never  Vaping Use   Vaping status: Never Used  Substance Use Topics   Alcohol use: Never    Comment: rare - couple times/yr   Drug use: Never    Pertinent Clinical Results:  LABS:   No visits with results within 3 Day(s) from this visit.  Latest known visit with results is:  Hospital Outpatient Visit on 04/05/2023  Component Date Value Ref Range Status   ABO/RH(D) 04/05/2023 A POS   Final   Antibody Screen 04/05/2023 NEG   Final   Sample Expiration 04/05/2023 04/19/2023,2359   Final   Extend sample reason 04/05/2023    Final                   Value:NO TRANSFUSIONS OR PREGNANCY IN THE PAST 3 MONTHS Performed at [facility], [address]  [place], [place] [address]    Sodium 04/05/2023 138  135 - 145 mmol/L Final   Potassium 04/05/2023 3.9  3.5 - 5.1 mmol/L Final   Chloride 04/05/2023 105  98 - 111 mmol/L Final   CO2 04/05/2023 25  22 - 32 mmol/L Final   Glucose, Bld 04/05/2023 131 (H)  70 - 99 mg/dL Final   Glucose reference range applies only to samples taken after fasting for at least 8 hours.   BUN 04/05/2023 25 (H)  8 - 23 mg/dL Final   Creatinine, Ser 04/05/2023 1.19  0.61 - 1.24 mg/dL Final   Calcium 60/45/4098 8.8 (L)  8.9 - 10.3 mg/dL Final   GFR, Estimated 04/05/2023 >60  >60 mL/min Final   Comment: (NOTE) Calculated using the CKD-EPI Creatinine Equation (2021)    Anion gap 04/05/2023 8  5 - 15 Final   Performed at [facility], [address] [place], [place] [address]   WBC 04/05/2023 8.3  4.0 - 10.5  K/uL Final   RBC 04/05/2023 3.65 (L)  4.22 - 5.81 MIL/uL Final   Hemoglobin 04/05/2023 11.2 (L)  13.0 - 17.0 g/dL Final   HCT 78/29/5621 32.7 (L)  39.0 - 52.0 % Final   MCV 04/05/2023 89.6  80.0 - 100.0 fL Final   MCH 04/05/2023 30.7  26.0 - 34.0 pg Final   MCHC 04/05/2023 34.3  30.0 - 36.0 g/dL Final   RDW 30/86/5784 12.9  11.5 - 15.5 % Final   Platelets 04/05/2023 202  150 - 400 K/uL Final   nRBC 04/05/2023 0.0  0.0 - 0.2 % Final   Neutrophils Relative % 04/05/2023 58  % Final   Neutro Abs 04/05/2023 4.8  1.7 - 7.7 K/uL Final   Lymphocytes Relative 04/05/2023 28  % Final   Lymphs Abs 04/05/2023 2.3  0.7 - 4.0 K/uL Final   Monocytes Relative 04/05/2023 8  % Final   Monocytes Absolute 04/05/2023 0.6  0.1 - 1.0 K/uL Final   Eosinophils Relative 04/05/2023 5  % Final   Eosinophils Absolute 04/05/2023 0.4  0.0 - 0.5 K/uL Final   Basophils Relative 04/05/2023 1  % Final   Basophils Absolute 04/05/2023 0.1  0.0 - 0.1 K/uL Final   Immature Granulocytes 04/05/2023 0  % Final   Abs Immature Granulocytes 04/05/2023 0.03  0.00 - 0.07 K/uL Final   Performed at [facility], [address] [place], [place] [address]    ECG: Date: 03/19/2023 Time ECG obtained: 1542 PM Rate: 66 bpm Rhythm: Sinus rhythm; IRBBB Axis (leads I and aVF): Nor all mal Intervals: PR 195 ms. QRS 114 ms. QTc 494 ms. ST segment and T wave changes: No evidence of acute ST segment elevation or depression Comparison: Similar to previous tracing obtained on 07/26/2022   IMAGING / PROCEDURES: CT CORONARY MORPH W/CTA COR W/SCORE W/CA W/CM &/OR WO/CM performed on 09/21/2022 Coronary calcium score of 591. This was 57th percentile for age and sex matched control. Normal coronary origin with right dominance. Mild proximal RCA and LAD stenosis (25-49%). Minimal proximal LCx stenosis (<25%). CAD-RADS 2. Mild non-obstructive CAD (25-49%). Consider non-atherosclerotic causes of chest pain. Consider preventive therapy  and risk factor modification. Image quality degraded by motion related artifacts. No significant extracardiac findings  TRANSTHORACIC ECHOCARDIOGRAM performed on 08/27/2022 Normal left ventricular systolic function with an EF of >55% Mild asymmetric LVH Left ventricular diastolic Doppler parameters consistent with abnormal relaxation (G1DD). GLS -17.9%  Left atrium mildly enlarged Trivial aortic and pulmonary valve regurgitation  Mild mitral and tricuspid valve regurgitation Mild aortic valve stenosis with a mean pressure gradient of 19.9 mmHg; AVA (VTI) = 1.1 cm Mildly dilated aortic root   US CAROTID BILATERAL performed on close 08/22/2022 Right carotid artery system: 50-69% stenosis secondary to moderate atherosclerotic plaque formation, most prominent about the bifurcation and proximal internal carotid artery. Left carotid artery system: Less than 50% stenosis secondary to mild multifocal atherosclerotic plaque formation. Vertebral artery system: Patent with antegrade flow bilaterally.  Impression and Plan:  [name] has been referred for pre-anesthesia review and clearance prior to him undergoing the planned anesthetic and procedural courses. Available labs, pertinent testing, and imaging results were personally reviewed by me in preparation for upcoming operative/procedural course. [facility] medical record has been updated following extensive record review and patient interview with PAT staff.   This patient has been appropriately cleared by cardiology with an overall MODERATE risk of experiencing significant perioperative cardiovascular complications. Based on clinical review performed today (04/08/23), barring any significant acute changes in the patient's overall condition, it is anticipated that he will be able to proceed with the planned surgical intervention. Any acute changes in clinical condition may necessitate his procedure being postponed and/or cancelled. Patient  will meet with anesthesia team (MD and/or CRNA) on the day of his procedure for preoperative evaluation/assessment. Questions regarding anesthetic course will be fielded at that time.   Pre-surgical instructions were reviewed with the patient during his PAT appointment, and questions were fielded to satisfaction by PAT clinical staff. He has been instructed on which medications that he will need to hold prior to surgery, as well as the ones that have been deemed safe/appropriate to take on the day of his procedure. As part of the general education provided by PAT, patient made aware both verbally and in writing, that he would need to abstain from the use of any illegal substances during his perioperative course.  He was advised that failure to follow the provided instructions could necessitate case cancellation or result in serious perioperative complications up to and including death. Patient encouraged to contact PAT and/or his surgeon's office to discuss any questions or concerns that may arise prior to surgery; verbalized understanding.   Quentin Mulling, MSN, APRN, FNP-C, CEN [facility]  Peri-operative Services Nurse Practitioner Phone: [phone] Fax: [phone] 04/08/23 5:16 PM  NOTE: This note has been prepared using Dragon dictation software. Despite my best ability to proofread, there is always the potential that unintentional transcriptional errors may still occur from this process.

## 2023-04-10 ENCOUNTER — Encounter (INDEPENDENT_AMBULATORY_CARE_PROVIDER_SITE_OTHER): Payer: Self-pay | Admitting: Vascular Surgery

## 2023-04-11 ENCOUNTER — Inpatient Hospital Stay
Admission: RE | Admit: 2023-04-11 | Discharge: 2023-04-17 | DRG: 253 | Disposition: A | Discharge status: Home / Self Care | Payer: Medicare Other | Attending: Vascular Surgery | Admitting: Vascular Surgery

## 2023-04-11 ENCOUNTER — Encounter: Payer: Self-pay | Admitting: Vascular Surgery

## 2023-04-11 ENCOUNTER — Inpatient Hospital Stay: Payer: Medicare Other | Admitting: Urgent Care

## 2023-04-11 ENCOUNTER — Other Ambulatory Visit: Payer: Self-pay

## 2023-04-11 ENCOUNTER — Encounter
Admission: RE | Disposition: A | Discharge status: Home / Self Care | Payer: Self-pay | Source: Home / Self Care | Attending: Vascular Surgery

## 2023-04-11 DIAGNOSIS — Z7902 Long term (current) use of antithrombotics/antiplatelets: Secondary | ICD-10-CM

## 2023-04-11 DIAGNOSIS — E1122 Type 2 diabetes mellitus with diabetic chronic kidney disease: Secondary | ICD-10-CM | POA: Diagnosis present

## 2023-04-11 DIAGNOSIS — E785 Hyperlipidemia, unspecified: Secondary | ICD-10-CM | POA: Diagnosis present

## 2023-04-11 DIAGNOSIS — I5032 Chronic diastolic (congestive) heart failure: Secondary | ICD-10-CM | POA: Diagnosis present

## 2023-04-11 DIAGNOSIS — I70269 Atherosclerosis of native arteries of extremities with gangrene, unspecified extremity: Principal | ICD-10-CM | POA: Diagnosis present

## 2023-04-11 DIAGNOSIS — I080 Rheumatic disorders of both mitral and aortic valves: Secondary | ICD-10-CM | POA: Diagnosis present

## 2023-04-11 DIAGNOSIS — G629 Polyneuropathy, unspecified: Secondary | ICD-10-CM | POA: Diagnosis present

## 2023-04-11 DIAGNOSIS — I25119 Atherosclerotic heart disease of native coronary artery with unspecified angina pectoris: Secondary | ICD-10-CM | POA: Diagnosis present

## 2023-04-11 DIAGNOSIS — E1169 Type 2 diabetes mellitus with other specified complication: Secondary | ICD-10-CM | POA: Diagnosis present

## 2023-04-11 DIAGNOSIS — Z87891 Personal history of nicotine dependence: Secondary | ICD-10-CM | POA: Diagnosis not present

## 2023-04-11 DIAGNOSIS — I96 Gangrene, not elsewhere classified: Secondary | ICD-10-CM | POA: Diagnosis not present

## 2023-04-11 DIAGNOSIS — L03314 Cellulitis of groin: Secondary | ICD-10-CM | POA: Diagnosis not present

## 2023-04-11 DIAGNOSIS — Z9842 Cataract extraction status, left eye: Secondary | ICD-10-CM | POA: Diagnosis not present

## 2023-04-11 DIAGNOSIS — Z95828 Presence of other vascular implants and grafts: Secondary | ICD-10-CM | POA: Diagnosis not present

## 2023-04-11 DIAGNOSIS — Z89422 Acquired absence of other left toe(s): Secondary | ICD-10-CM

## 2023-04-11 DIAGNOSIS — I70235 Atherosclerosis of native arteries of right leg with ulceration of other part of foot: Secondary | ICD-10-CM | POA: Diagnosis present

## 2023-04-11 DIAGNOSIS — I77810 Thoracic aortic ectasia: Secondary | ICD-10-CM | POA: Diagnosis present

## 2023-04-11 DIAGNOSIS — Z887 Allergy status to serum and vaccine status: Secondary | ICD-10-CM

## 2023-04-11 DIAGNOSIS — R9431 Abnormal electrocardiogram [ECG] [EKG]: Secondary | ICD-10-CM | POA: Diagnosis present

## 2023-04-11 DIAGNOSIS — N1831 Chronic kidney disease, stage 3a: Secondary | ICD-10-CM | POA: Diagnosis present

## 2023-04-11 DIAGNOSIS — E1152 Type 2 diabetes mellitus with diabetic peripheral angiopathy with gangrene: Secondary | ICD-10-CM | POA: Diagnosis present

## 2023-04-11 DIAGNOSIS — Z7984 Long term (current) use of oral hypoglycemic drugs: Secondary | ICD-10-CM | POA: Diagnosis not present

## 2023-04-11 DIAGNOSIS — I130 Hypertensive heart and chronic kidney disease with heart failure and stage 1 through stage 4 chronic kidney disease, or unspecified chronic kidney disease: Secondary | ICD-10-CM | POA: Diagnosis present

## 2023-04-11 DIAGNOSIS — Z79899 Other long term (current) drug therapy: Secondary | ICD-10-CM

## 2023-04-11 DIAGNOSIS — Z7982 Long term (current) use of aspirin: Secondary | ICD-10-CM | POA: Diagnosis not present

## 2023-04-11 DIAGNOSIS — L7634 Postprocedural seroma of skin and subcutaneous tissue following other procedure: Secondary | ICD-10-CM | POA: Diagnosis not present

## 2023-04-11 DIAGNOSIS — M868X7 Other osteomyelitis, ankle and foot: Secondary | ICD-10-CM | POA: Diagnosis present

## 2023-04-11 DIAGNOSIS — I700 Atherosclerosis of aorta: Secondary | ICD-10-CM | POA: Diagnosis present

## 2023-04-11 DIAGNOSIS — Z8249 Family history of ischemic heart disease and other diseases of the circulatory system: Secondary | ICD-10-CM

## 2023-04-11 DIAGNOSIS — I70261 Atherosclerosis of native arteries of extremities with gangrene, right leg: Secondary | ICD-10-CM

## 2023-04-11 DIAGNOSIS — M86171 Other acute osteomyelitis, right ankle and foot: Secondary | ICD-10-CM | POA: Diagnosis not present

## 2023-04-11 DIAGNOSIS — L539 Erythematous condition, unspecified: Secondary | ICD-10-CM | POA: Diagnosis not present

## 2023-04-11 DIAGNOSIS — I4510 Unspecified right bundle-branch block: Secondary | ICD-10-CM | POA: Diagnosis present

## 2023-04-11 DIAGNOSIS — Z9889 Other specified postprocedural states: Secondary | ICD-10-CM | POA: Diagnosis not present

## 2023-04-11 DIAGNOSIS — Z833 Family history of diabetes mellitus: Secondary | ICD-10-CM

## 2023-04-11 DIAGNOSIS — Z9841 Cataract extraction status, right eye: Secondary | ICD-10-CM | POA: Diagnosis not present

## 2023-04-11 HISTORY — DX: Gangrene, not elsewhere classified: I96

## 2023-04-11 HISTORY — DX: Other visual disturbances: H53.8

## 2023-04-11 HISTORY — DX: Palpitations: R00.2

## 2023-04-11 HISTORY — PX: FEMORAL-TIBIAL BYPASS GRAFT: SHX938

## 2023-04-11 HISTORY — DX: Unspecified right bundle-branch block: I45.10

## 2023-04-11 HISTORY — DX: Chronic kidney disease, stage 3 unspecified: N18.30

## 2023-04-11 HISTORY — DX: Long term (current) use of antithrombotics/antiplatelets: Z79.02

## 2023-04-11 HISTORY — DX: Type 2 diabetes mellitus without complications: E11.9

## 2023-04-11 HISTORY — DX: Chronic diastolic (congestive) heart failure: I50.32

## 2023-04-11 HISTORY — DX: Atherosclerosis of native arteries of extremities with intermittent claudication, unspecified extremity: I70.219

## 2023-04-11 HISTORY — DX: Hyperlipidemia, unspecified: E78.5

## 2023-04-11 LAB — CREATININE, SERUM
Creatinine, Ser: 1.05 mg/dL (ref 0.61–1.24)
GFR, Estimated: >60 mL/min (ref >60)

## 2023-04-11 LAB — CBC
HCT: 32.6 % — ABNORMAL LOW (ref 39.0–52.0)
Hemoglobin: 10.9 g/dL — ABNORMAL LOW (ref 13.0–17.0)
MCH: 30.1 pg (ref 26.0–34.0)
MCHC: 33.4 g/dL (ref 30.0–36.0)
MCV: 90.1 fL (ref 80.0–100.0)
Platelets: 148 10*3/uL — ABNORMAL LOW (ref 150–400)
RBC: 3.62 MIL/uL — ABNORMAL LOW (ref 4.22–5.81)
RDW: 13 % (ref 11.5–15.5)
WBC: 11.6 10*3/uL — ABNORMAL HIGH (ref 4.0–10.5)
nRBC: 0 % (ref 0.0–0.2)

## 2023-04-11 LAB — ABO/RH: ABO/RH(D): A POS

## 2023-04-11 LAB — GLUCOSE, CAPILLARY
Glucose-Capillary: 129 mg/dL — ABNORMAL HIGH (ref 70–99)
Glucose-Capillary: 136 mg/dL — ABNORMAL HIGH (ref 70–99)

## 2023-04-11 LAB — MRSA NEXT GEN BY PCR, NASAL: MRSA by PCR Next Gen: NOT DETECTED

## 2023-04-11 SURGERY — CREATION, BYPASS, ARTERIAL, FEMORAL TO TIBIAL, USING GRAFT
Anesthesia: General | Site: Leg Lower | Laterality: Right

## 2023-04-11 MED ORDER — DOCUSATE SODIUM 100 MG PO CAPS
100.0000 mg | ORAL_CAPSULE | Freq: Every day | ORAL | Status: DC
  Administered 2023-04-12 – 2023-04-17 (×6): 100 mg via ORAL
  Filled 2023-04-11 (×7): qty 1

## 2023-04-11 MED ORDER — ORAL CARE MOUTH RINSE: 15.0000 mL | Freq: Once | OROMUCOSAL | Status: AC

## 2023-04-11 MED ORDER — ROSUVASTATIN CALCIUM 10 MG PO TABS
10.0000 mg | ORAL_TABLET | Freq: Every day | ORAL | Status: DC
  Administered 2023-04-11 – 2023-04-17 (×7): 10 mg via ORAL
  Filled 2023-04-11 (×8): qty 1

## 2023-04-11 MED ORDER — ONDANSETRON HCL 4 MG/2ML IJ SOLN: 4.0000 mg | Freq: Four times a day (QID) | INTRAMUSCULAR | Status: DC | PRN

## 2023-04-11 MED ORDER — METOPROLOL TARTRATE 5 MG/5ML IV SOLN: 2.0000 mg | INTRAVENOUS | Status: DC | PRN

## 2023-04-11 MED ORDER — GLYCOPYRROLATE 0.2 MG/ML IJ SOLN
INTRAMUSCULAR | Status: DC | PRN
  Administered 2023-04-11 (×2): .1 mg via INTRAVENOUS

## 2023-04-11 MED ORDER — PROPOFOL 10 MG/ML IV BOLUS
INTRAVENOUS | Status: DC | PRN
  Administered 2023-04-11 (×2): 50 mg via INTRAVENOUS
  Administered 2023-04-11: 30 mg via INTRAVENOUS
  Administered 2023-04-11: 110 mg via INTRAVENOUS
  Administered 2023-04-11: 40 mg via INTRAVENOUS

## 2023-04-11 MED ORDER — DOPAMINE-DEXTROSE 3.2-5 MG/ML-% IV SOLN: 3.0000 ug/kg/min | INTRAVENOUS | Status: DC

## 2023-04-11 MED ORDER — ONDANSETRON HCL 4 MG/2ML IJ SOLN
INTRAMUSCULAR | Status: AC
  Filled 2023-04-11: qty 2

## 2023-04-11 MED ORDER — PROPOFOL 10 MG/ML IV BOLUS
INTRAVENOUS | Status: AC
  Filled 2023-04-11: qty 20

## 2023-04-11 MED ORDER — LABETALOL HCL 5 MG/ML IV SOLN
10.0000 mg | INTRAVENOUS | Status: DC | PRN
  Administered 2023-04-13: 10 mg via INTRAVENOUS
  Filled 2023-04-11: qty 4

## 2023-04-11 MED ORDER — CHLORHEXIDINE GLUCONATE CLOTH 2 % EX PADS
6.0000 | MEDICATED_PAD | Freq: Once | CUTANEOUS | Status: AC
  Administered 2023-04-10: 6 via TOPICAL

## 2023-04-11 MED ORDER — PHENYLEPHRINE HCL-NACL 20-0.9 MG/250ML-% IV SOLN
INTRAVENOUS | Status: DC | PRN
  Administered 2023-04-11: 10 ug/min via INTRAVENOUS

## 2023-04-11 MED ORDER — GUAIFENESIN-DM 100-10 MG/5ML PO SYRP: 15.0000 mL | ORAL_SOLUTION | ORAL | Status: DC | PRN

## 2023-04-11 MED ORDER — HYDRALAZINE HCL 20 MG/ML IJ SOLN
5.0000 mg | INTRAMUSCULAR | Status: DC | PRN
  Administered 2023-04-11: 5 mg via INTRAVENOUS
  Filled 2023-04-11: qty 0.25

## 2023-04-11 MED ORDER — SUGAMMADEX SODIUM 200 MG/2ML IV SOLN
INTRAVENOUS | Status: DC | PRN
  Administered 2023-04-11: 300 mg via INTRAVENOUS

## 2023-04-11 MED ORDER — CHLORHEXIDINE GLUCONATE 0.12 % MT SOLN
15.0000 mL | Freq: Once | OROMUCOSAL | Status: AC
  Administered 2023-04-11: 15 mL via OROMUCOSAL

## 2023-04-11 MED ORDER — HYDROMORPHONE HCL 1 MG/ML IJ SOLN: 1.0000 mg | Freq: Once | INTRAMUSCULAR | Status: DC | PRN

## 2023-04-11 MED ORDER — NITROGLYCERIN IN D5W 200-5 MCG/ML-% IV SOLN: 5.0000 ug/min | INTRAVENOUS | Status: DC

## 2023-04-11 MED ORDER — NITROGLYCERIN IN D5W 200-5 MCG/ML-% IV SOLN
INTRAVENOUS | Status: AC
  Filled 2023-04-11: qty 250

## 2023-04-11 MED ORDER — HEPARIN 30,000 UNITS/1000 ML (OHS) CELLSAVER SOLUTION
Status: AC
  Filled 2023-04-11: qty 1000

## 2023-04-11 MED ORDER — ROCURONIUM BROMIDE 10 MG/ML (PF) SYRINGE
PREFILLED_SYRINGE | INTRAVENOUS | Status: AC
  Filled 2023-04-11: qty 10

## 2023-04-11 MED ORDER — "VISTASEAL 4 ML SINGLE DOSE KIT "
PACK | CUTANEOUS | Status: AC
  Filled 2023-04-11: qty 4

## 2023-04-11 MED ORDER — MAGNESIUM SULFATE 2 GM/50ML IV SOLN: 2.0000 g | Freq: Every day | INTRAVENOUS | Status: DC | PRN

## 2023-04-11 MED ORDER — GLYCOPYRROLATE 0.2 MG/ML IJ SOLN
INTRAMUSCULAR | Status: AC
  Filled 2023-04-11: qty 1

## 2023-04-11 MED ORDER — OXYCODONE-ACETAMINOPHEN 5-325 MG PO TABS
1.0000 | ORAL_TABLET | ORAL | Status: DC | PRN
  Administered 2023-04-12 – 2023-04-17 (×3): 1 via ORAL
  Filled 2023-04-11 (×3): qty 1

## 2023-04-11 MED ORDER — HYDROMORPHONE HCL 1 MG/ML IJ SOLN
INTRAMUSCULAR | Status: DC | PRN
  Administered 2023-04-11 (×2): .2 mg via INTRAVENOUS

## 2023-04-11 MED ORDER — ASPIRIN 81 MG PO TBEC
81.0000 mg | DELAYED_RELEASE_TABLET | Freq: Every day | ORAL | Status: DC
  Administered 2023-04-12 – 2023-04-17 (×5): 81 mg via ORAL
  Filled 2023-04-11 (×5): qty 1

## 2023-04-11 MED ORDER — FAMOTIDINE IN NACL 20-0.9 MG/50ML-% IV SOLN
20.0000 mg | INTRAVENOUS | Status: DC
  Administered 2023-04-11 – 2023-04-12 (×2): 20 mg via INTRAVENOUS
  Filled 2023-04-11 (×2): qty 50

## 2023-04-11 MED ORDER — MORPHINE SULFATE (PF) 2 MG/ML IV SOLN: 2.0000 mg | INTRAVENOUS | Status: DC | PRN

## 2023-04-11 MED ORDER — LACTATED RINGERS IV SOLN: INTRAVENOUS | Status: DC

## 2023-04-11 MED ORDER — ACETAMINOPHEN 10 MG/ML IV SOLN
INTRAVENOUS | Status: AC
  Filled 2023-04-11: qty 100

## 2023-04-11 MED ORDER — SODIUM CHLORIDE 0.9 % IR SOLN
Status: DC | PRN
  Administered 2023-04-11: 501 mL

## 2023-04-11 MED ORDER — CHLORHEXIDINE GLUCONATE 0.12 % MT SOLN
OROMUCOSAL | Status: AC
  Filled 2023-04-11: qty 15

## 2023-04-11 MED ORDER — HYDRALAZINE HCL 20 MG/ML IJ SOLN
INTRAMUSCULAR | Status: AC
  Filled 2023-04-11: qty 1

## 2023-04-11 MED ORDER — EPHEDRINE SULFATE (PRESSORS) 50 MG/ML IJ SOLN
INTRAMUSCULAR | Status: DC | PRN
  Administered 2023-04-11: 15 mg via INTRAVENOUS
  Administered 2023-04-11 (×2): 5 mg via INTRAVENOUS

## 2023-04-11 MED ORDER — ACETAMINOPHEN 325 MG PO TABS
325.0000 mg | ORAL_TABLET | ORAL | Status: DC | PRN
  Administered 2023-04-14 – 2023-04-15 (×2): 650 mg via ORAL
  Filled 2023-04-11 (×3): qty 2

## 2023-04-11 MED ORDER — OXYCODONE HCL 5 MG/5ML PO SOLN: 5.0000 mg | Freq: Once | ORAL | Status: DC | PRN

## 2023-04-11 MED ORDER — ACETAMINOPHEN 10 MG/ML IV SOLN
INTRAVENOUS | Status: DC | PRN
  Administered 2023-04-11: 1000 mg via INTRAVENOUS

## 2023-04-11 MED ORDER — CEFAZOLIN SODIUM-DEXTROSE 2-4 GM/100ML-% IV SOLN
2.0000 g | Freq: Three times a day (TID) | INTRAVENOUS | Status: AC
  Administered 2023-04-11 – 2023-04-12 (×2): 2 g via INTRAVENOUS
  Filled 2023-04-11 (×2): qty 100

## 2023-04-11 MED ORDER — NITROGLYCERIN 0.2 MG/ML ON CALL CATH LAB
INTRAVENOUS | Status: DC | PRN
  Administered 2023-04-11: 60 ug via INTRAVENOUS
  Administered 2023-04-11: 20 ug via INTRAVENOUS

## 2023-04-11 MED ORDER — CHLORHEXIDINE GLUCONATE CLOTH 2 % EX PADS
6.0000 | MEDICATED_PAD | Freq: Once | CUTANEOUS | Status: AC
  Administered 2023-04-11: 6 via TOPICAL

## 2023-04-11 MED ORDER — POTASSIUM CHLORIDE CRYS ER 20 MEQ PO TBCR: 20.0000 meq | EXTENDED_RELEASE_TABLET | Freq: Every day | ORAL | Status: DC | PRN

## 2023-04-11 MED ORDER — HEMOSTATIC AGENTS (NO CHARGE) OPTIME
TOPICAL | Status: DC | PRN
  Administered 2023-04-11: 1 via TOPICAL

## 2023-04-11 MED ORDER — CEFAZOLIN SODIUM-DEXTROSE 2-4 GM/100ML-% IV SOLN
2.0000 g | INTRAVENOUS | Status: AC
  Administered 2023-04-11: 2 g via INTRAVENOUS

## 2023-04-11 MED ORDER — ORAL CARE MOUTH RINSE: 15.0000 mL | OROMUCOSAL | Status: DC | PRN

## 2023-04-11 MED ORDER — ACETAMINOPHEN 10 MG/ML IV SOLN: 1000.0000 mg | Freq: Once | INTRAVENOUS | Status: DC | PRN

## 2023-04-11 MED ORDER — CLOPIDOGREL BISULFATE 75 MG PO TABS
75.0000 mg | ORAL_TABLET | Freq: Every day | ORAL | Status: DC
  Administered 2023-04-11 – 2023-04-17 (×6): 75 mg via ORAL
  Filled 2023-04-11 (×7): qty 1

## 2023-04-11 MED ORDER — DEXAMETHASONE SODIUM PHOSPHATE 10 MG/ML IJ SOLN
INTRAMUSCULAR | Status: AC
  Filled 2023-04-11: qty 1

## 2023-04-11 MED ORDER — OXYCODONE HCL 5 MG PO TABS: 5.0000 mg | ORAL_TABLET | Freq: Once | ORAL | Status: DC | PRN

## 2023-04-11 MED ORDER — "VISTASEAL 4 ML SINGLE DOSE KIT "
PACK | CUTANEOUS | Status: DC | PRN
  Administered 2023-04-11: 4 mL via TOPICAL

## 2023-04-11 MED ORDER — PHENOL 1.4 % MT LIQD: 1.0000 | OROMUCOSAL | Status: DC | PRN

## 2023-04-11 MED ORDER — FENTANYL CITRATE (PF) 100 MCG/2ML IJ SOLN
INTRAMUSCULAR | Status: AC
  Filled 2023-04-11: qty 2

## 2023-04-11 MED ORDER — SODIUM CHLORIDE 0.9 % IV SOLN: INTRAVENOUS | Status: DC

## 2023-04-11 MED ORDER — ACETAMINOPHEN 650 MG RE SUPP: 325.0000 mg | RECTAL | Status: DC | PRN

## 2023-04-11 MED ORDER — ONDANSETRON HCL 4 MG/2ML IJ SOLN: 4.0000 mg | Freq: Once | INTRAMUSCULAR | Status: DC | PRN

## 2023-04-11 MED ORDER — HEPARIN SODIUM (PORCINE) 1000 UNIT/ML IJ SOLN
INTRAMUSCULAR | Status: DC | PRN
  Administered 2023-04-11: 6000 [IU] via INTRAVENOUS

## 2023-04-11 MED ORDER — ENOXAPARIN SODIUM 40 MG/0.4ML IJ SOSY
40.0000 mg | PREFILLED_SYRINGE | INTRAMUSCULAR | Status: DC
  Administered 2023-04-12 – 2023-04-17 (×5): 40 mg via SUBCUTANEOUS
  Filled 2023-04-11 (×6): qty 0.4

## 2023-04-11 MED ORDER — VASOPRESSIN 20 UNIT/ML IV SOLN
INTRAVENOUS | Status: AC
  Filled 2023-04-11: qty 1

## 2023-04-11 MED ORDER — CEFAZOLIN SODIUM-DEXTROSE 2-4 GM/100ML-% IV SOLN
INTRAVENOUS | Status: AC
  Filled 2023-04-11: qty 100

## 2023-04-11 MED ORDER — EPHEDRINE 5 MG/ML INJ
INTRAVENOUS | Status: AC
  Filled 2023-04-11: qty 5

## 2023-04-11 MED ORDER — SENNOSIDES-DOCUSATE SODIUM 8.6-50 MG PO TABS: 1.0000 | ORAL_TABLET | Freq: Every evening | ORAL | Status: DC | PRN

## 2023-04-11 MED ORDER — SODIUM CHLORIDE 0.9 % IV SOLN: 500.0000 mL | Freq: Once | INTRAVENOUS | Status: AC | PRN

## 2023-04-11 MED ORDER — ALBUMIN HUMAN 5 % IV SOLN
INTRAVENOUS | Status: AC
  Filled 2023-04-11: qty 250

## 2023-04-11 MED ORDER — HEPARIN 30,000 UNITS/1000 ML (OHS) CELLSAVER SOLUTION
Status: AC | PRN
  Administered 2023-04-11: 1

## 2023-04-11 MED ORDER — LIDOCAINE HCL (CARDIAC) PF 100 MG/5ML IV SOSY
PREFILLED_SYRINGE | INTRAVENOUS | Status: DC | PRN
  Administered 2023-04-11: 60 mg via INTRAVENOUS

## 2023-04-11 MED ORDER — DEXAMETHASONE SODIUM PHOSPHATE 10 MG/ML IJ SOLN
INTRAMUSCULAR | Status: DC | PRN
  Administered 2023-04-11: 6 mg via INTRAVENOUS

## 2023-04-11 MED ORDER — LABETALOL HCL 5 MG/ML IV SOLN
INTRAVENOUS | Status: AC
  Filled 2023-04-11: qty 4

## 2023-04-11 MED ORDER — LIDOCAINE HCL (PF) 2 % IJ SOLN
INTRAMUSCULAR | Status: AC
  Filled 2023-04-11: qty 5

## 2023-04-11 MED ORDER — ROCURONIUM BROMIDE 100 MG/10ML IV SOLN
INTRAVENOUS | Status: DC | PRN
  Administered 2023-04-11: 5 mg via INTRAVENOUS
  Administered 2023-04-11: 20 mg via INTRAVENOUS
  Administered 2023-04-11: 10 mg via INTRAVENOUS
  Administered 2023-04-11: 50 mg via INTRAVENOUS
  Administered 2023-04-11: 20 mg via INTRAVENOUS

## 2023-04-11 MED ORDER — HEPARIN SODIUM (PORCINE) 5000 UNIT/ML IJ SOLN
INTRAMUSCULAR | Status: AC
  Filled 2023-04-11: qty 1

## 2023-04-11 MED ORDER — ALUM & MAG HYDROXIDE-SIMETH 200-200-20 MG/5ML PO SUSP: 15.0000 mL | ORAL | Status: DC | PRN

## 2023-04-11 MED ORDER — LABETALOL HCL 5 MG/ML IV SOLN
INTRAVENOUS | Status: DC | PRN
  Administered 2023-04-11 (×3): 2.5 mg via INTRAVENOUS

## 2023-04-11 MED ORDER — FENTANYL CITRATE (PF) 100 MCG/2ML IJ SOLN: 25.0000 ug | INTRAMUSCULAR | Status: DC | PRN

## 2023-04-11 MED ORDER — SORBITOL 70 % SOLN: 30.0000 mL | Freq: Every day | Status: DC | PRN

## 2023-04-11 MED ORDER — ONDANSETRON HCL 4 MG/2ML IJ SOLN
INTRAMUSCULAR | Status: DC | PRN
Start: 2023-04-11 — End: 2023-04-11
  Administered 2023-04-11: 4 mg via INTRAVENOUS

## 2023-04-11 MED ORDER — SODIUM CHLORIDE 0.9 % IV SOLN: INTRAVENOUS | Status: DC | PRN

## 2023-04-11 MED ORDER — HYDROMORPHONE HCL 1 MG/ML IJ SOLN
INTRAMUSCULAR | Status: AC
  Filled 2023-04-11: qty 1

## 2023-04-11 MED ORDER — FENTANYL CITRATE (PF) 100 MCG/2ML IJ SOLN
INTRAMUSCULAR | Status: DC | PRN
  Administered 2023-04-11 (×2): 50 ug via INTRAVENOUS

## 2023-04-11 MED ORDER — PHENYLEPHRINE HCL (PRESSORS) 10 MG/ML IV SOLN
INTRAVENOUS | Status: DC | PRN
  Administered 2023-04-11: 120 ug via INTRAVENOUS

## 2023-04-11 SURGICAL SUPPLY — 90 items
ADH SKN CLS APL DERMABOND .7 (GAUZE/BANDAGES/DRESSINGS) ×4
APL PRP STRL LF DISP 70% ISPRP (MISCELLANEOUS) ×4
APPLIER CLIP 11 MED OPEN (CLIP)
APPLIER CLIP 9.375 SM OPEN (CLIP)
APR CLP MED 11 20 MLT OPN (CLIP)
APR CLP SM 9.3 20 MLT OPN (CLIP)
BAG DECANTER FOR FLEXI CONT (MISCELLANEOUS) ×2 IMPLANT
BAG ISL LRG 20X20 DRWSTRG (DRAPES) ×2
BAG ISOLATATION DRAPE 20X20 ST (DRAPES) ×2 IMPLANT
BLADE CLIPPER SURG (BLADE) IMPLANT
BLADE SURG 15 STRL LF DISP TIS (BLADE) ×2 IMPLANT
BLADE SURG 15 STRL SS (BLADE) ×2
BLADE SURG SZ11 CARB STEEL (BLADE) ×2 IMPLANT
BOOT SUTURE VASCULAR YLW (MISCELLANEOUS) ×2
BRUSH SCRUB EZ 4% CHG (MISCELLANEOUS) ×2 IMPLANT
CANISTER WOUND CARE 500ML ATS (WOUND CARE) IMPLANT
CAP TUBING WOUND VAC TRAC (MISCELLANEOUS) IMPLANT
CHLORAPREP W/TINT 26 (MISCELLANEOUS) ×4 IMPLANT
CLAMP SUTURE YELLOW 5 PAIRS (MISCELLANEOUS) ×4 IMPLANT
CLIP APPLIE 11 MED OPEN (CLIP) IMPLANT
CLIP APPLIE 9.375 SM OPEN (CLIP) IMPLANT
CONNECTOR Y WND VAC (MISCELLANEOUS) IMPLANT
DERMABOND ADVANCED .7 DNX12 (GAUZE/BANDAGES/DRESSINGS) ×4 IMPLANT
DRAPE 3/4 80X56 (DRAPES) ×2 IMPLANT
DRAPE INCISE IOBAN 66X45 STRL (DRAPES) ×4 IMPLANT
DRESSING SURGICEL FIBRLLR 1X2 (HEMOSTASIS) ×4 IMPLANT
DRSG OPSITE POSTOP 4X10 (GAUZE/BANDAGES/DRESSINGS) IMPLANT
DRSG OPSITE POSTOP 4X14 (GAUZE/BANDAGES/DRESSINGS) IMPLANT
DRSG OPSITE POSTOP 4X8 (GAUZE/BANDAGES/DRESSINGS) IMPLANT
DRSG SURGICEL FIBRILLAR 1X2 (HEMOSTASIS) ×2
DRSG VAC GRANUFOAM LG (GAUZE/BANDAGES/DRESSINGS) IMPLANT
ELECT CAUTERY BLADE 6.4 (BLADE) ×4 IMPLANT
ELECT REM PT RETURN 9FT ADLT (ELECTROSURGICAL) ×2
ELECTRODE REM PT RTRN 9FT ADLT (ELECTROSURGICAL) ×2 IMPLANT
GAUZE 4X4 16PLY ~~LOC~~+RFID DBL (SPONGE) ×4 IMPLANT
GEL ULTRASOUND 20GR AQUASONIC (MISCELLANEOUS) ×2 IMPLANT
GLOVE BIO SURGEON STRL SZ7 (GLOVE) ×4 IMPLANT
GLOVE SURG SYN 8.0 (GLOVE) ×2 IMPLANT
GLOVE SURG SYN 8.0 PF PI (GLOVE) ×2 IMPLANT
GOWN STRL REUS W/ TWL LRG LVL3 (GOWN DISPOSABLE) ×8 IMPLANT
GOWN STRL REUS W/ TWL XL LVL3 (GOWN DISPOSABLE) ×4 IMPLANT
GOWN STRL REUS W/TWL LRG LVL3 (GOWN DISPOSABLE) ×8
GOWN STRL REUS W/TWL XL LVL3 (GOWN DISPOSABLE) ×4
HEAD CUTTING 'VALVULOTOME URSL (MISCELLANEOUS) IMPLANT
HOLDER FOLEY CATH W/STRAP (MISCELLANEOUS) IMPLANT
IV NS 500ML (IV SOLUTION) ×2
IV NS 500ML BAXH (IV SOLUTION) ×2 IMPLANT
KIT TURNOVER KIT A (KITS) ×2 IMPLANT
LABEL OR SOLS (LABEL) ×2 IMPLANT
LOOP VESSEL MAXI 1X406 RED (MISCELLANEOUS) ×6 IMPLANT
LOOP VESSEL MINI 0.8X406 BLUE (MISCELLANEOUS) ×4 IMPLANT
MANIFOLD NEPTUNE II (INSTRUMENTS) ×2 IMPLANT
NDL FILTER BLUNT 18X1 1/2 (NEEDLE) ×2 IMPLANT
NEEDLE FILTER BLUNT 18X1 1/2 (NEEDLE) IMPLANT
NS IRRIG 1000ML POUR BTL (IV SOLUTION) ×2 IMPLANT
PACK BASIN MAJOR ARMC (MISCELLANEOUS) ×2 IMPLANT
PACK UNIVERSAL (MISCELLANEOUS) ×2 IMPLANT
PAD PREP OB/GYN DISP 24X41 (PERSONAL CARE ITEMS) ×2 IMPLANT
PENCIL SMOKE EVACUATOR (MISCELLANEOUS) IMPLANT
SET WALTER ACTIVATION W/DRAPE (SET/KITS/TRAYS/PACK) ×2 IMPLANT
SPONGE T-LAP 18X18 ~~LOC~~+RFID (SPONGE) ×6 IMPLANT
STAPLER SKIN PROX 35W (STAPLE) ×2 IMPLANT
SUT ETHIBOND CT1 BRD #0 30IN (SUTURE) IMPLANT
SUT MNCRL+ 5-0 UNDYED PC-3 (SUTURE) ×2 IMPLANT
SUT PROLENE 3 0 SH DA (SUTURE) ×2 IMPLANT
SUT PROLENE 5 0 RB 1 DA (SUTURE) ×4 IMPLANT
SUT PROLENE 6 0 BV (SUTURE) ×12 IMPLANT
SUT PROLENE 7 0 BV 1 (SUTURE) ×8 IMPLANT
SUT SILK 2 0 (SUTURE) ×2
SUT SILK 2 0 SH (SUTURE) ×2 IMPLANT
SUT SILK 2-0 18XBRD TIE 12 (SUTURE) ×2 IMPLANT
SUT SILK 3 0 (SUTURE) ×2
SUT SILK 3-0 18XBRD TIE 12 (SUTURE) ×2 IMPLANT
SUT SILK 4 0 (SUTURE) ×2
SUT SILK 4-0 18XBRD TIE 12 (SUTURE) ×2 IMPLANT
SUT VIC AB 2-0 CT1 (SUTURE) ×6 IMPLANT
SUT VIC AB 3-0 SH 27 (SUTURE) ×4
SUT VIC AB 3-0 SH 27X BRD (SUTURE) ×2 IMPLANT
SUT VICRYL+ 3-0 36IN CT-1 (SUTURE) ×6 IMPLANT
SYR 20ML LL LF (SYRINGE) ×2 IMPLANT
SYR 3ML LL SCALE MARK (SYRINGE) ×2 IMPLANT
TAG SUTURE CLAMP YLW 5PR (MISCELLANEOUS) ×2
TAPE UMBILICAL 1/8 X36 TWILL (MISCELLANEOUS) ×2 IMPLANT
TOWEL OR 17X26 4PK STRL BLUE (TOWEL DISPOSABLE) ×2 IMPLANT
TRAP FLUID SMOKE EVACUATOR (MISCELLANEOUS) ×2 IMPLANT
TRAY FOLEY MTR SLVR 16FR STAT (SET/KITS/TRAYS/PACK) ×2 IMPLANT
VALVULOTOME HEAD CUTTING URSL (MISCELLANEOUS) IMPLANT
VALVULOTOME URESIL (MISCELLANEOUS)
WATER STERILE IRR 500ML POUR (IV SOLUTION) ×2 IMPLANT
WND VAC CAP TUBING TRAC (MISCELLANEOUS)

## 2023-04-11 NOTE — Anesthesia Postprocedure Evaluation (Signed)
Anesthesia Post Note  Patient: FRANCK VINAL  Procedure(s) Performed: BYPASS GRAFT FEMORAL-TIBIAL ARTERY ( POPLITEAL TO PERONEAL) (Right: Leg Lower) APPLICATION OF CELL SAVER (Right)  Patient location during evaluation: PACU Anesthesia Type: General Level of consciousness: awake and alert Pain management: pain level controlled Vital Signs Assessment: post-procedure vital signs reviewed and stable Respiratory status: spontaneous breathing, nonlabored ventilation, respiratory function stable and patient connected to nasal cannula oxygen Cardiovascular status: blood pressure returned to baseline and stable Postop Assessment: no apparent nausea or vomiting Anesthetic complications: no   No notable events documented.   Last Vitals:  Vitals:   04/11/23 1715 04/11/23 1730  BP:    Pulse: (!) 58 (!) 54  Resp: 13 11  Temp:    SpO2: 98% 98%    Last Pain:  Vitals:   04/11/23 1718  TempSrc:   PainSc: 3                  Louie Boston

## 2023-04-11 NOTE — Anesthesia Procedure Notes (Signed)
Arterial Line Insertion Start/End7/25/2024 1:04 PM, 04/11/2023 1:08 PM Performed by: Reed Breech, MD, anesthesiologist  Patient location: OR. Preanesthetic checklist: patient identified, IV checked, site marked, risks and benefits discussed, surgical consent, monitors and equipment checked, pre-op evaluation, timeout performed and anesthesia consent Patient sedated Left, radial was placed Catheter size: 20 G Hand hygiene performed  and maximum sterile barriers used   Attempts: 1 Procedure performed using ultrasound guided technique. Ultrasound Notes:anatomy identified, needle tip was noted to be adjacent to the nerve/plexus identified and no ultrasound evidence of intravascular and/or intraneural injection Following insertion, dressing applied and Biopatch. Post procedure assessment: normal and unchanged  Patient tolerated the procedure well with no immediate complications.

## 2023-04-11 NOTE — Plan of Care (Signed)
  Problem: Education: Goal: Understanding of CV disease, CV risk reduction, and recovery process will improve Outcome: Progressing

## 2023-04-11 NOTE — Interval H&P Note (Signed)
History and Physical Interval Note:  04/11/2023 11:53 AM  [name]  has presented today for surgery, with the diagnosis of ASO WITH GANGRENE.  The various methods of treatment have been discussed with the patient and family. After consideration of risks, benefits and other options for treatment, the patient has consented to  Procedure(s): BYPASS GRAFT FEMORAL-TIBIAL ARTERY ( POPLITEAL TO PERONEAL) (Right) APPLICATION OF CELL SAVER (Right) as a surgical intervention.  The patient's history has been reviewed, patient examined, no change in status, stable for surgery.  I have reviewed the patient's chart and labs.  Questions were answered to the patient's satisfaction.     [name]

## 2023-04-11 NOTE — Transfer of Care (Signed)
Immediate Anesthesia Transfer of Care Note  Patient: [name]  Procedure(s) Performed: BYPASS GRAFT FEMORAL-TIBIAL ARTERY ( POPLITEAL TO PERONEAL) (Right: Leg Lower) APPLICATION OF CELL SAVER (Right)  Patient Location: PACU  Anesthesia Type:General  Level of Consciousness: awake and patient cooperative  Airway & Oxygen Therapy: Patient Spontanous Breathing and Patient connected to face mask oxygen  Post-op Assessment: Report given to RN and Post -op Vital signs reviewed and stable  Post vital signs: Reviewed and stable  Last Vitals:  Vitals Value Taken Time  BP 181/65 04/11/23 1633  Temp    Pulse 55 04/11/23 1638  Resp 11 04/11/23 1638  SpO2 100 % 04/11/23 1638  Vitals shown include unfiled device data.  Last Pain:  Vitals:   04/11/23 1052  TempSrc: Temporal  PainSc: 0-No pain      Patients Stated Pain Goal: 0 (04/11/23 1052)  Complications: No notable events documented.

## 2023-04-11 NOTE — Op Note (Signed)
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 male who presents with atherosclerotic occlusive disease associated with gangrenous changes of the right forefoot.  He is undergoing angiography but his situation was not amenable to interventional techniques alone.  He is at risk for limb loss and therefore he is undergoing a popliteal to peroneal bypass for limb salvage.  The patient required surgical bypass for revascularization.  Risks and benefits were discussed including but not limited to bleeding, infection, thrombosis, limb loss, injury to nearby structures, cardiopulmonary complications, and death.  DESCRIPTION: After full informed written consent was obtained, the patient was brought back to the operating room and placed supine upon the operating table.  Prior to induction, the patient was given intravenous antibiotics.  After obtaining adequate anesthesia, the patient was prepped and draped in the standard fashion for a popliteal to peroneal bypass operation.    Co-surgeons are required because this is a bilateral procedure with work being performed simultaneously from both the right femoral and left femoral approach.  This also expedites the procedure making a shorter operative time reducing complications and improving patient safety.  Attention was turned to the right groin.  A longitudinal incision was made over the right great saphenous vein.  Using blunt dissection  and electrocautery, the great saphenous vein was dissected out from near the inguinal ligament down to the mid thigh.  The vein was then marked with a surgical marker.  All branches were ligated and divided between 2-0 and 3-0 silk ties.      At this point, attention was turned to the calf.  An longitudinal incision was made one finger-width posterior to the tibia.  Using blunt dissection and electrocautery, a plane was developed through the subcutaneous tissue and fascia down to the popliteal space.  The popliteal vein was dissected out and retracted medially and posteriorly.  The below-the-knee popliteal artery was dissected away from the popliteal vein.  This popliteal artery was found to be a suitable target for the proximal inflow.      The dissection was then carried down along the anterior wall of the artery ligating and dividing any veins encountered between 2-0 and 3-0 silk ties as well as 5-0 Prolene.  Once the tibioperoneal trunk was identified it was then continued identifying the origin of the posterior tibial as well as the origin of the peroneal.  Peroneal artery was then dissected down approximately 5 cm from its origin and then it was dissected circumferentially.  Vesseloops were placed around the origins of the posterior tibial anterior tibial proximal popliteal as well as the distal peroneal.  6000 units of heparin was given and allowed to circulate for 4 to 5 minutes.  At this point, the patient's right greater saphenous vein was dissected out for harvest for conduit.  The vein conduit was found to be adequate for bypass conduit.    After securing vascular control arteriotomy was made in the peroneal artery with an 11 blade and then extended with Potts scissors.  The artery itself  was profoundly diseased and the true lumen was difficult to uncover.  Ultimately I did have to dissect down another 2 cm or so and was able to get into the peroneal's true lumen.  The saphenous vein was then  harvested and reversed.  It was spatulated to match the arteriotomy in the peroneal and in and vein to side peroneal artery anastomosis was fashioned using running 6-0 Prolene.  The leg was then straightened and the vein approximated to the popliteal artery.  Popliteal artery was opened with 11 blade extended with Potts scissors the proximal vein was then spatulated and an end vein to side popliteal artery anastomosis was fashioned using running 6-0 Prolene.  Flushing maneuvers were performed and flow was established into the peroneal.  A couple bleeding spots along the peroneal suture line were then controlled with 6-0 interrupted Prolene's.  Once hemostasis was achieved palpation of the peroneal distal to the anastomosis demonstrated an excellent pulse.  At this point, all incisions were washed out and Surgicel and Evicel were placed into both incisions.    Any bleeding in both incisions were controlled with electrocautery and suture ligature.  The calf incision was closed with interrupted deep sutures to reapproximate the deep muscles and a running stitch of 3-0 Vicryl in the subcutaneous tissue.  The skin was reapproximated with staples.   Attention was turned to the thigh.  The thigh was repaired with a double layer of 3-0 Vicryl immediately superficial to the bypass conduit.  The skin was reapproximated with staples and honeycomb dressings were applied to both incisions.  The patient was then awakened from anesthesia and taken to the recovery room in stable condition having tolerated the procedure well.     COMPLICATIONS: none  CONDITION: stable   [name] 04/11/2023 4:30 PM     This note was created with Dragon Medical transcription system. Any errors in dictation are purely unintentional.

## 2023-04-11 NOTE — Anesthesia Preprocedure Evaluation (Addendum)
Anesthesia Evaluation  Patient identified by MRN, date of birth, ID band Patient awake    Reviewed: Allergy & Precautions, NPO status , Patient's Chart, lab work & pertinent test results  History of Anesthesia Complications Negative for: history of anesthetic complications  Airway Mallampati: III   Neck ROM: Full    Dental  (+) Edentulous Upper, Edentulous Lower   Pulmonary former smoker (quit 1985)   Pulmonary exam normal breath sounds clear to auscultation       Cardiovascular hypertension, + CAD and + Peripheral Vascular Disease (bilateral carotid disease; LE atherosclerosis with claudication)  + Valvular Problems/Murmurs (mild AS)  Rhythm:Regular Rate:Normal + Systolic murmurs ECG 03/19/23: Sinus rhythm Incomplete right bundle branch block Borderline prolonged QT interval  Echo 08/27/22:  NORMAL LEFT VENTRICULAR SYSTOLIC FUNCTION WITH AN ESTIMATED EF = >55 %  NORMAL RIGHT VENTRICULAR SYSTOLIC FUNCTION  MILD VALVULAR REGURGITATION 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    Cardiology note 03/29/23:  82 y.o. male with  1. Palpitations  2. Chest pain at rest  3. Coronary artery disease, unspecified vessel or lesion type, unspecified whether angina present, unspecified whether native or transplanted heart  4. Murmur  5. Vertigo  6. PVD (peripheral vascular disease) (CMS-HCC)  7. Mixed hyperlipidemia  8. Essential hypertension  9. Controlled type 2 diabetes mellitus with diabetic  peripheral angiopathy without gangrene, without long-term current use of insulin (CMS/HHS-HCC)  10. SOB (shortness of breath)  11. Neuropathy  12. Myalgia  13. Pre-operative clearance   Plan  Preoperative clearance for total lower extremity treatment by vascular with Dr. Gilda Crease patient has 2 ischemia every need revascularization Patient appears to be an acceptable moderate risk for the procedure Vertigo intermittent recurrent consider meclizine as needed for symptom management Diabetes type 2 uncomplicated continue current medical Hypertension poor control consider advancing beta-blocker ACE or ARB with diuretic as well as statin therapy Peripheral vascular disease preop for intervention recommend statin therapy aspirin possible Plavix have the patient follow-up 1 month  Return in about 6 months (around 09/29/2023).    Reproductive/Obstetrics                             Anesthesia Physical Anesthesia Plan  ASA: 4  Anesthesia Plan: General   Post-op Pain Management:    Induction: Intravenous  PONV Risk Score and Plan: 2 and Ondansetron, Dexamethasone and Treatment may vary due to age or medical condition  Airway Management Planned: Oral ETT  Additional Equipment: Arterial line  Intra-op Plan:   Post-operative Plan: Extubation in OR  Informed Consent: I have reviewed the patients History and Physical, chart, labs and discussed the procedure including the risks, benefits and alternatives for the proposed anesthesia with the patient or authorized representative who has indicated his/her understanding and acceptance.     Dental advisory given  Plan Discussed with: CRNA  Anesthesia Plan Comments: (Patient consented for risks of anesthesia including but not limited to:  - adverse reactions to medications - damage  to eyes, teeth, lips or other oral mucosa - nerve damage due to positioning  - sore throat or hoarseness - damage to heart, brain,  nerves, lungs, other parts of body or loss of life  Informed patient about role of CRNA in peri- and intra-operative care.  Patient voiced understanding.)        Anesthesia Quick Evaluation

## 2023-04-11 NOTE — Anesthesia Procedure Notes (Signed)
Procedure Name: Intubation Date/Time: 04/11/2023 12:58 PM  Performed by: Jeannene Patella, CRNAPre-anesthesia Checklist: Patient identified, Emergency Drugs available, Suction available, Patient being monitored and Timeout performed Patient Re-evaluated:Patient Re-evaluated prior to induction Oxygen Delivery Method: Circle system utilized Preoxygenation: Pre-oxygenation with 100% oxygen Induction Type: IV induction Ventilation: Mask ventilation without difficulty and Oral airway inserted - appropriate to patient size Laryngoscope Size: McGraph and 4 Grade View: Grade I Tube type: Oral Tube size: 7.5 mm Number of attempts: 1 Airway Equipment and Method: Stylet, LTA kit utilized and Video-laryngoscopy Placement Confirmation: ETT inserted through vocal cords under direct vision, positive ETCO2 and breath sounds checked- equal and bilateral Secured at: 22 (lip) cm Tube secured with: Tape Dental Injury: Teeth and Oropharynx as per pre-operative assessment

## 2023-04-11 NOTE — Op Note (Signed)
[facility]   OPERATIVE NOTE     PRE-OPERATIVE DIAGNOSIS: PAD with gangrene right foot  POST-OPERATIVE DIAGNOSIS: Same  PROCEDURE: Right below-the-knee popliteal artery to peroneal artery bypass with reverse saphenous vein graft  SURGEONS: Festus Barren, MD and [name], MD  ASSISTANT(S): none  ANESTHESIA: general  ESTIMATED BLOOD LOSS: 400 cc  FINDING(S): none  SPECIMEN(S):  none  INDICATIONS:   [name] is a 82 y.o. male who presents with occlusion of the distal popliteal artery, tibioperoneal trunk, and proximal portion of the peroneal artery with the peroneal artery being the only runoff distally.  This is being bypass surgically to try to restore flow to the foot.  The patient required surgical bypass for revascularization.  Risks and benefits were discussed including but not limited to bleeding, infection, thrombosis, limb loss, injury to nearby structures, cardiopulmonary complications, and death.  DESCRIPTION: After full informed written consent was obtained, the patient was brought back to the operating room and placed supine upon the operating table.  Prior to induction, the patient was given intravenous antibiotics.  After obtaining adequate anesthesia, the patient was prepped and draped in the standard fashion for a popliteal to tibial bypass operation.    At this point, attention was turned to the calf.  An longitudinal incision was made one finger-width posterior to the tibia.  Using blunt dissection and electrocautery, a plane was developed through the subcutaneous tissue and fascia down to the popliteal space.  The popliteal vein was dissected out and retracted medially and posteriorly.  The below-the-knee popliteal artery was dissected away from the popliteal vein.  This popliteal artery was found to be a suitable target for the proximal bypass anastomosis.      The dissection was then continued down distally to find the distal  target.  The tibioperoneal trunk, posterior tibial and peroneal arteries were then identified.  The peroneal artery was then dissected out a good 8 to 10 cm distally to where it was soft and suitable for distal bypass anastomosis.  At this point, the patient's right thigh greater saphenous vein was dissected out for harvest for conduit.  Incision was made over the greater saphenous vein from the saphenofemoral junction down to lower thigh.  The vein conduit was found to be adequate for bypass conduit.  Side branches of greater saphenous vein were tied off with 3-0 silk ties.  At the end of this process, I felt the conduit to be adequate in quality and size for use.  It was marked for orientation and prepared to be ligated proximally and distally for use for surgical bypass.     At this point, the patient was given 6000 units of Heparin intravenously.  After waiting three minutes, the peroneal artery was controlled proximally and distally and prepared for a distal bypass anastomosis.  An arteriotomy was created with 11 blade and extended with Potts scissors.  Consideration was given to along endarterectomy in this section, but the anatomy was more suitable for bypass.  The vein had been ligated proximally and distally and was marked for orientation.  The proximal portion of the vein that was close to the saphenofemoral junction was then prepared for bypass anastomosis to the peroneal artery.  The saphenous vein was then opened and spatulated to match the arteriotomy of the peroneal artery.  An anastomosis was created with a 6-0 Prolene suture in the usual fashion from the saphenous vein to the peroneal artery.  The vessel was flushed with  heparinized saline and backbled before completion of the anastomosis.   Attention was then turned to the popliteal exposure.  We verified the popliteal artery was appropriately marked.  We determine the target segment for the anastomosis.  We applied tension to control the  artery proximally and distally with vessel loops.  I made an incision with a 11-blade in the artery and extended it proximally and distally with a Potts scissor.  We pulled the conduit to appropriate tension and length, taking into account straightening out the leg.  We adjusted the length of the conduit sharply.  I spatulated this conduit to meet the dimensions of the arteriotomy.  The conduit was sewn to the popliteal artery with a running stitch of 6-0 Prolene.  Prior to completing this anastomosis, all vessels were backbled. No thrombus was noted from any vessels and backbleeding was good.  The bypass conduit was allowed to bleed in an antegrade fashion with excellent pulsatile flow.  The anastomosis was completed in the usual fashion.  On completion of the anastomosis, the foot was warm and pink with good condensation seen in the foot bag.  There was a good pulse in the peroneal artery distal to the bypass.  6-0 Prolene patch sutures were used for hemostasis at the anastomosis as needed at each site.  At this point, all incisions were washed out and Fibrillar and Vistacel were placed into both incisions.    At this point, bleeding in both incisions were controlled with electrocautery and suture ligature.  The calf incision was closed with interrupted deep sutures to reapproximate the deep muscles and a running stitch of 3-0 Vicryl in the subcutaneous tissue.  The skin was reapproximated with staples.   The vein harvest incisions were closed with a layer of 3-0 Vicryl in the subcutaneous tissue.  The skin was then reapproximated with staples.  The skin was cleaned, dried, and sterile bandages applied.   The patient was then awakened from anesthesia and taken to the recovery room in stable condition having tolerated the procedure well.     COMPLICATIONS: none  CONDITION: stable   Festus Barren 04/11/2023 4:33 PM     This note was created with Dragon Medical transcription system. Any errors in  dictation are purely unintentional.

## 2023-04-12 ENCOUNTER — Encounter: Payer: Self-pay | Admitting: Vascular Surgery

## 2023-04-12 LAB — BASIC METABOLIC PANEL: Chloride: 107 mmol/L (ref 98–111)

## 2023-04-12 LAB — GLUCOSE, CAPILLARY: Glucose-Capillary: 115 mg/dL — ABNORMAL HIGH (ref 70–99)

## 2023-04-12 MED ORDER — SODIUM CHLORIDE 0.9 % IV SOLN: INTRAVENOUS | Status: DC | PRN

## 2023-04-12 NOTE — Evaluation (Signed)
Physical Therapy Evaluation Patient Details Name: [name] MRN: [id number] DOB: December 05, 1940 Today's Date: 04/12/2023  History of Present Illness  patient is an 82 year old with evidence of severe atherosclerotic changes of both lower extremities associated with ulceration and tissue loss of the right foot.  This represents a limb threatening ischemia and places the patient at a high risk for limb loss. Patient is s/p arterial reconstruction, popliteal to tibial bypass using reversed great saphenous vein of the right lower extremity with the hope for limb salvage 04/11/23.  Clinical Impression  Patient received in bed, he is agreeable to PT session. Patient requires min A to get supine to sitting edge of bed. He is able to stand with min guard and ambulated 25 feet with RW and min guard. 4/10 pain reported. Patient will continue to benefit from skilled PT to improve functional independence and strength.         Assistance Recommended at Discharge Set up Supervision/Assistance  If plan is discharge home, recommend the following:  Can travel by private vehicle  A little help with walking and/or transfers;A little help with bathing/dressing/bathroom;Assist for transportation;Help with stairs or ramp for entrance        Equipment Recommendations Rolling walker (2 wheels)  Recommendations for Other Services       Functional Status Assessment Patient has had a recent decline in their functional status and demonstrates the ability to make significant improvements in function in a reasonable and predictable amount of time.     Precautions / Restrictions Precautions Precautions: Fall Restrictions Weight Bearing Restrictions: No      Mobility  Bed Mobility Overal bed mobility: Needs Assistance Bed Mobility: Supine to Sit     Supine to sit: Min assist     General bed mobility comments: to raise trunk to sitting    Transfers Overall transfer level: Needs assistance Equipment  used: Rolling walker (2 wheels) Transfers: Sit to/from Stand Sit to Stand: Min guard           General transfer comment: cues for hand placement    Ambulation/Gait Ambulation/Gait assistance: Min guard Gait Distance (Feet): 25 Feet Assistive device: Rolling walker (2 wheels) Gait Pattern/deviations: Step-through pattern, Decreased step length - right, Decreased step length - left, Decreased stride length Gait velocity: decr     General Gait Details: patient ambulated well a short distance. Self limiting.  Stairs            Wheelchair Mobility     Tilt Bed    Modified Rankin (Stroke Patients Only)       Balance Overall balance assessment: Modified Independent                                           Pertinent Vitals/Pain Pain Assessment Pain Assessment: 0-10 Pain Score: 4  Pain Location: R le Pain Descriptors / Indicators: Discomfort, Sore Pain Intervention(s): Monitored during session, Repositioned    Home Living Family/patient expects to be discharged to:: Private residence Living Arrangements: Spouse/significant other Available Help at Discharge: Family;Available 24 hours/day Type of Home: Mobile home Home Access: Ramped entrance       Home Layout: One level Home Equipment: Cane - single point      Prior Function Prior Level of Function : Independent/Modified Independent  Hand Dominance        Extremity/Trunk Assessment   Upper Extremity Assessment Upper Extremity Assessment: Defer to OT evaluation    Lower Extremity Assessment Lower Extremity Assessment: RLE deficits/detail RLE Deficits / Details: soreness from surgery, WNL for basic mobility    Cervical / Trunk Assessment Cervical / Trunk Assessment: Normal  Communication   Communication: No difficulties  Cognition Arousal/Alertness: Awake/alert Behavior During Therapy: WFL for tasks assessed/performed Overall Cognitive Status:  Within Functional Limits for tasks assessed                                          General Comments      Exercises     Assessment/Plan    PT Assessment Patient needs continued PT services  PT Problem List Decreased activity tolerance;Decreased balance;Decreased mobility;Pain;Decreased safety awareness       PT Treatment Interventions DME instruction;Gait training;Functional mobility training;Therapeutic activities;Patient/family education;Balance training;Therapeutic exercise    PT Goals (Current goals can be found in the Care Plan section)  Acute Rehab PT Goals Patient Stated Goal: to return home PT Goal Formulation: With patient/family Time For Goal Achievement: 04/26/23 Potential to Achieve Goals: Good    Frequency Min 1X/week     Co-evaluation PT/OT/SLP Co-Evaluation/Treatment: Yes Reason for Co-Treatment: Necessary to address cognition/behavior during functional activity;For patient/therapist safety;To address functional/ADL transfers PT goals addressed during session: Mobility/safety with mobility;Balance;Proper use of DME         AM-PAC PT "6 Clicks" Mobility  Outcome Measure Help needed turning from your back to your side while in a flat bed without using bedrails?: A Little Help needed moving from lying on your back to sitting on the side of a flat bed without using bedrails?: A Little Help needed moving to and from a bed to a chair (including a wheelchair)?: A Little Help needed standing up from a chair using your arms (e.g., wheelchair or bedside chair)?: A Little Help needed to walk in hospital room?: A Little Help needed climbing 3-5 steps with a railing? : A Lot 6 Click Score: 17    End of Session   Activity Tolerance: Patient tolerated treatment well Patient left: in chair;with call bell/phone within reach;with family/visitor present Nurse Communication: Mobility status PT Visit Diagnosis: Other abnormalities of gait and mobility  (R26.89);Difficulty in walking, not elsewhere classified (R26.2);Pain Pain - Right/Left: Right Pain - part of body: Leg    Time: 7846-9629 PT Time Calculation (min) (ACUTE ONLY): 18 min   Charges:   PT Evaluation $PT Eval Moderate Complexity: 1 Mod   PT General Charges $$ ACUTE PT VISIT: 1 Visit         Jameya Pontiff, PT, GCS 04/12/23,1:33 PM

## 2023-04-12 NOTE — Progress Notes (Signed)
PT Cancellation Note  Patient Details Name: [name] MRN: [id number] DOB: 1941/04/13   Cancelled Treatment:    Reason Eval/Treat Not Completed: Patient declined, no reason specified. Patient states he is tired, wants to wait until tomorrow. Told him I would return in the pm and he agreed.    Tyrome Donatelli 04/12/2023, 10:11 AM

## 2023-04-12 NOTE — Progress Notes (Signed)
Attempted to call report, awaiting return call.

## 2023-04-12 NOTE — Progress Notes (Signed)
Report given to receiving nurse Jill Side, RN

## 2023-04-12 NOTE — Progress Notes (Signed)
Patient transferred to room 207 with cardiac monitoring and in stable condition.

## 2023-04-12 NOTE — Progress Notes (Signed)
Order received from Rolla Plate NP to discontinue arterial line.

## 2023-04-12 NOTE — Evaluation (Signed)
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  This represents a limb threatening ischemia and places the patient at a high risk for limb loss. Patient is s/p arterial reconstruction, popliteal to tibial bypass using reversed great saphenous vein of the right lower extremity with the hope for limb salvage 04/11/23.   Clinical Impression   Patient presenting with decreased Ind in self care,balance, functional mobility/transfers, endurance, and safety awareness. Patient reports living at home with wife and being Ind in mobility, self care, and IADL tasks.   Patient currently functioning  at min guard with use of RW for ambulation and functional transfer with use of RW this session. Pt's wife present in room for observation. He does fatigue quickly. Patient will benefit from acute OT to increase overall independence in the areas of ADLs, functional mobility, and safety awareness in order to safely discharge.      Recommendations for follow up therapy are one component of a multi-disciplinary discharge planning process, led by the attending physician.  Recommendations may be updated based on patient status, additional functional criteria and insurance authorization.   Assistance Recommended at Discharge Intermittent Supervision/Assistance  Patient can return home with the following A little help with walking and/or transfers;A little help with bathing/dressing/bathroom;Assistance with cooking/housework;Assist for transportation;Help with stairs or ramp for entrance    Functional Status Assessment  Patient has had a recent decline in their functional status and demonstrates the ability to make significant improvements in function in a  reasonable and predictable amount of time.  Equipment Recommendations  Other (comment) (RW)       Precautions / Restrictions Precautions Precautions: Fall Restrictions Weight Bearing Restrictions: No      Mobility Bed Mobility Overal bed mobility: Needs Assistance Bed Mobility: Supine to Sit     Supine to sit: Min assist     General bed mobility comments: to raise trunk to sitting    Transfers Overall transfer level: Needs assistance Equipment used: Rolling walker (2 wheels) Transfers: Sit to/from Stand Sit to Stand: Min guard                  Balance Overall balance assessment: Modified Independent                                         ADL either performed or assessed with clinical judgement   ADL Overall ADL's : Needs assistance/impaired                         Toilet Transfer: Min guard;Ambulation                   Vision Patient Visual Report: No change from baseline              Pertinent Vitals/Pain Pain Assessment Pain Assessment: 0-10 Pain Score: 4  Pain Location: R LE Pain Descriptors / Indicators: Discomfort, Sore Pain Intervention(s): Monitored during session, Repositioned     Hand Dominance Right   Extremity/Trunk Assessment Upper Extremity Assessment Upper Extremity Assessment: Overall WFL for tasks assessed   Lower Extremity Assessment Lower Extremity Assessment: Generalized weakness RLE Deficits / Details: soreness from  surgery, WNL for basic mobility   Cervical / Trunk Assessment Cervical / Trunk Assessment: Normal   Communication Communication Communication: No difficulties   Cognition Arousal/Alertness: Awake/alert Behavior During Therapy: WFL for tasks assessed/performed Overall Cognitive Status: Within Functional Limits for tasks assessed                                                  Home Living Family/patient expects to be discharged to:: Private  residence Living Arrangements: Spouse/significant other Available Help at Discharge: Family;Available 24 hours/day Type of Home: Mobile home Home Access: Ramped entrance     Home Layout: One level     Bathroom Shower/Tub: Sponge bathes at baseline         Home Equipment: Cane - single point          Prior Functioning/Environment Prior Level of Function : Independent/Modified Independent               ADLs Comments: Pt takes basin baths at baseline. He and wife share IADLs and she drives.        OT Problem List: Decreased strength;Decreased activity tolerance;Decreased safety awareness;Impaired balance (sitting and/or standing);Decreased knowledge of use of DME or AE      OT Treatment/Interventions: Self-care/ADL training;Therapeutic exercise;Therapeutic activities;Energy conservation;DME and/or AE instruction;Patient/family education;Balance training    OT Goals(Current goals can be found in the care plan section) Acute Rehab OT Goals Patient Stated Goal: to decrease pain and go home OT Goal Formulation: With patient/family Time For Goal Achievement: 04/26/23 Potential to Achieve Goals: Fair ADL Goals Pt Will Perform Grooming: with modified independence;standing Pt Will Perform Lower Body Dressing: with modified independence;sit to/from stand;with adaptive equipment Pt Will Transfer to Toilet: with modified independence;ambulating Pt Will Perform Toileting - Clothing Manipulation and hygiene: with modified independence;sit to/from stand  OT Frequency: Min 1X/week    Co-evaluation   Reason for Co-Treatment: Necessary to address cognition/behavior during functional activity;For patient/therapist safety;To address functional/ADL transfers PT goals addressed during session: Mobility/safety with mobility;Balance;Proper use of DME        AM-PAC OT "6 Clicks" Daily Activity     Outcome Measure Help from another person eating meals?: None Help from another person  taking care of personal grooming?: A Little Help from another person toileting, which includes using toliet, bedpan, or urinal?: A Little Help from another person bathing (including washing, rinsing, drying)?: A Little Help from another person to put on and taking off regular upper body clothing?: None Help from another person to put on and taking off regular lower body clothing?: A Little 6 Click Score: 20   End of Session Equipment Utilized During Treatment: Rolling walker (2 wheels) Nurse Communication: Mobility status  Activity Tolerance: Patient tolerated treatment well Patient left: in chair;with call bell/phone within reach;with family/visitor present  OT Visit Diagnosis: Unsteadiness on feet (R26.81);Muscle weakness (generalized) (M62.81);Pain Pain - Right/Left: Left Pain - part of body: Leg                Time: 9604-5409 OT Time Calculation (min): 18 min Charges:  OT General Charges $OT Visit: 1 Visit OT Evaluation $OT Eval Low Complexity: 1 Low  Jackquline Denmark, MS, OTR/L , CBIS ascom [phone]  04/12/23, 1:44 PM

## 2023-04-13 MED ORDER — FAMOTIDINE 20 MG PO TABS
20.0000 mg | ORAL_TABLET | Freq: Every day | ORAL | Status: DC
  Administered 2023-04-13 – 2023-04-16 (×4): 20 mg via ORAL
  Filled 2023-04-13 (×4): qty 1

## 2023-04-13 NOTE — TOC CM/SW Note (Signed)
Per chart review patient with poor attention/concentration.   Attempted call to spouse to discuss OT and PT recs. No answer, unable to leave a VM, will re attempt later.  Alfonso Ramus, LCSW Transitions of Care Department [phone]

## 2023-04-13 NOTE — Progress Notes (Addendum)
[facility]  Daily Progress Note   Subjective  -   Pain controlled, pt was up on leg briefly yesterday  Objective Vitals:   04/12/23 1700 04/12/23 1833 04/13/23 0402 04/13/23 0520  BP:  120/60 (!) 186/71 (!) 149/54  Pulse: 65 (!) 55 73 63  Resp: 17 18 18    Temp:  98 F (36.7 C) 99.5 F (37.5 C)   TempSrc:  Oral Oral   SpO2: 97% 98% 99%   Weight:      Height:        Intake/Output Summary (Last 24 hours) at 04/13/2023 0836 Last data filed at 04/13/2023 4098 Gross per 24 hour  Intake 2815.24 ml  Output 1650 ml  Net 1165.24 ml    PULM  CTAB CV  RRR VASC  R thigh inc c/d/I, calf inc c/d/I, warm foot, gangrenous toe dry without any ascending erythema, faintly palpable PTA  Laboratory CBC    Component Value Date/Time   WBC 12.8 (H) 04/12/2023 0017   HGB 10.3 (L) 04/12/2023 0017   HCT 31.0 (L) 04/12/2023 0017   PLT 170 04/12/2023 0017    BMET    Component Value Date/Time   NA 136 04/12/2023 0017   K 4.9 04/12/2023 0017   CL 107 04/12/2023 0017   CO2 21 (L) 04/12/2023 0017   GLUCOSE 169 (H) 04/12/2023 0017   BUN 26 (H) 04/12/2023 0017   CREATININE 1.15 04/12/2023 0017   CALCIUM 8.5 (L) 04/12/2023 0017   GFRNONAA >60 04/12/2023 0017   GFRAA >60 07/30/2018 1045    Assessment/Planning: POD #2 s/p R pop to peroneal bypass with ips rGSV  Pt is slowly recovering mobility Unclear from notes plan in regards to gangrenous toe Pt reports he spoke with Podiatry previously this admission but I don't see any notes from them Regardless, I suspect pt will not be ready for discharge until early part of this coming week.  Leonides Sake, MD, FACS, FSVS covering for [facility]  04/13/2023, 8:36 AM

## 2023-04-13 NOTE — Progress Notes (Signed)
PHARMACIST - PHYSICIAN COMMUNICATION  CONCERNING: IV to Oral Route Change Policy  RECOMMENDATION: This patient is receiving Pepcid by the intravenous route.  Based on criteria approved by the Pharmacy and Therapeutics Committee, the intravenous medication(s) is/are being converted to the equivalent oral dose form(s).  DESCRIPTION: These criteria include: The patient is eating (either orally or via tube) and/or has been taking other orally administered medications for a least 24 hours The patient has no evidence of active gastrointestinal bleeding or impaired GI absorption (gastrectomy, short bowel, patient on TNA or NPO).  If you have questions about this conversion, please contact the Pharmacy Department   Tressie Ellis, [facility] 04/13/2023 11:29 AM

## 2023-04-13 NOTE — TOC Initial Note (Addendum)
Transition of Care [facility]) - Initial/Assessment Note    Patient Details  Name: [name] MRN: [id number] Date of Birth: 1941/07/30  Transition of Care [facility]) CM/SW Contact:    Liliana Cline, LCSW Phone Number: 04/13/2023, 1:29 PM  Clinical Narrative:                 CSW spoke to spouse by phone. Patient lives home with his wife who takes him to appointments. PCP is Debbra Riding. [facility]acy [facility]. Patient has several canes at home. Patient had HH several years ago. Confirmed home address with wife.  Wife is agreeable to [facility] and RW recommendations. HH referral accepted by [facility] with Frances Furbish. Will leave handoff for TOC to order RW closer to DC as patient is not expected to DC within 48 hours.  3:50- Call from patient's wife who reports they have decided they want to do OPPT at [facility] instead. She states patient's daughter in law will transport him. Provided contact information for [facility], spouse verbalized understanding to call Monday for an appointment.   Expected Discharge Plan: Home w Home Health Services Barriers to Discharge: Continued Medical Work up   Patient Goals and CMS Choice Patient states their goals for this hospitalization and ongoing recovery are:: home with home health CMS Medicare.gov Compare Post Acute Care list provided to:: Patient Represenative (must comment) Choice offered to / list presented to : Spouse      Expected Discharge Plan and Services       Living arrangements for the past 2 months: Single Family Home                           HH Arranged: PT, OT HH Agency: [facility] Date [facility] Agency Contacted: 04/13/23   Representative spoke with at [facility] Agency: Kandee Keen  Prior Living Arrangements/Services Living arrangements for the past 2 months: Single Family Home Lives with:: Spouse Patient language and need for interpreter reviewed:: Yes Do you feel safe going back to the place where you  live?: Yes      Need for Family Participation in Patient Care: Yes (Comment) Care giver support system in place?: Yes (comment) Current home services: DME Criminal Activity/Legal Involvement Pertinent to Current Situation/Hospitalization: No - Comment as needed  Activities of Daily Living      Permission Sought/Granted Permission sought to share information with : Facility Industrial/product designer granted to share information with : Yes, Verbal Permission Granted (by wife)     Permission granted to share info w AGENCY: HH and DME agencies        Emotional Assessment         Alcohol / Substance Use: Not Applicable Psych Involvement: No (comment)  Admission diagnosis:  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) [I70.269] Patient Active Problem List   Diagnosis Date Noted   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) 04/11/2023   Chronic kidney disease, stage 3a (HCC) 03/19/2023   Chronic diastolic CHF (congestive heart failure) (HCC) 03/19/2023   PVD (peripheral vascular disease) (HCC) 03/19/2023   CAD (coronary artery disease) 03/19/2023   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   Atherosclerosis of native arteries of the extremities with ulceration (HCC) 04/28/2018   Diabetes (HCC) 04/28/2018   Essential hypertension 04/28/2018   Diabetic foot infection (HCC) 03/24/2018   PCP:  Wilford Corner, PA-C [facility]  [id number] [place] [facility] [address] [place] [address] Phone: [phone] Fax: [phone]     Social Determinants of Health (SDOH) Social History: SDOH Screenings   Food Insecurity: No Food Insecurity (03/20/2023)  Housing: Low Risk  (03/20/2023)  Transportation Needs: No Transportation Needs (03/20/2023)  Utilities: Not At Risk (03/20/2023)  Financial Resource Strain: Low Risk  (01/10/2023)   Received from [facility],  [facility]  Tobacco Use: Medium Risk (04/11/2023)   SDOH Interventions:     Readmission Risk Interventions    04/13/2023    1:27 PM  Readmission Risk Prevention Plan  Transportation Screening Complete  PCP or Specialist Appt within 5-7 Days Complete  Home Care Screening Complete  Medication Review (RN CM) Complete

## 2023-04-13 NOTE — Plan of Care (Signed)
The patient has been up in the chair today. He had small amount of drainage around the incision site on the right knee MD has been notified.  Problem: Education: Goal: Understanding of CV disease, CV risk reduction, and recovery process will improve Outcome: Progressing Goal: Individualized Educational Video(s) Outcome: Progressing   Problem: Activity: Goal: Ability to return to baseline activity level will improve Outcome: Progressing   Problem: Cardiovascular: Goal: Ability to achieve and maintain adequate cardiovascular perfusion will improve Outcome: Progressing Goal: Vascular access site(s) Level 0-1 will be maintained Outcome: Progressing   Problem: Health Behavior/Discharge Planning: Goal: Ability to safely manage health-related needs after discharge will improve Outcome: Progressing   Problem: Education: Goal: Knowledge of prescribed regimen will improve Outcome: Progressing   Problem: Activity: Goal: Ability to tolerate increased activity will improve Outcome: Progressing   Problem: Bowel/Gastric: Goal: Gastrointestinal status for postoperative course will improve Outcome: Progressing   Problem: Clinical Measurements: Goal: Postoperative complications will be avoided or minimized Outcome: Progressing Goal: Signs and symptoms of graft occlusion will improve Outcome: Progressing   Problem: Skin Integrity: Goal: Demonstration of wound healing without infection will improve Outcome: Progressing   Problem: Education: Goal: Knowledge of General Education information will improve Description: Including pain rating scale, medication(s)/side effects and non-pharmacologic comfort measures Outcome: Progressing   Problem: Health Behavior/Discharge Planning: Goal: Ability to manage health-related needs will improve Outcome: Progressing

## 2023-04-13 NOTE — Progress Notes (Signed)
PT Cancellation Note  Patient Details Name: [name] MRN: [id number] DOB: [birth year]   Cancelled Treatment:    Reason Eval/Treat Not Completed: Patient declined, no reason specified PT will continue to treat as pt is willing.   Hortencia Conradi, PTA  04/13/23, 10:53 AM

## 2023-04-13 NOTE — Progress Notes (Signed)
The patient continues to pass blood clots in urine.

## 2023-04-14 NOTE — Progress Notes (Signed)
[facility]  Daily Progress Note   Subjective  -   Walking yesterday with PT, pain tolerable, reported bleeding at incisions overnight  Objective Vitals:   04/13/23 1517 04/13/23 1940 04/14/23 0427 04/14/23 0857  BP: (!) 139/58 (!) 166/59 (!) 154/64 (!) 110/56  Pulse: 62 64 62 70  Resp: 16 18 16 18   Temp: 98.7 F (37.1 C) 98.7 F (37.1 C) 98.8 F (37.1 C) 98.7 F (37.1 C)  TempSrc:  Oral Oral Oral  SpO2: 97% 99% 98% 96%  Weight:      Height:        Intake/Output Summary (Last 24 hours) at 04/14/2023 1004 Last data filed at 04/14/2023 0700 Gross per 24 hour  Intake 720 ml  Output 1225 ml  Net -505 ml    PULM  CTAB CV  RRR VASC  R groin: dsg chg, no bleeding or drainage, inc c/d/I, R calf: dsg chg, no bleeding or drainage, inc c/d/I, R foot: gangrenous smelly 2nd toe, no drainage, 4th toes with distorted position, warm foot, faintly palpable PTA  Laboratory CBC    Component Value Date/Time   WBC 12.8 (H) 04/12/2023 0017   HGB 10.3 (L) 04/12/2023 0017   HCT 31.0 (L) 04/12/2023 0017   PLT 170 04/12/2023 0017    BMET    Component Value Date/Time   NA 136 04/12/2023 0017   K 4.9 04/12/2023 0017   CL 107 04/12/2023 0017   CO2 21 (L) 04/12/2023 0017   GLUCOSE 169 (H) 04/12/2023 0017   BUN 26 (H) 04/12/2023 0017   CREATININE 1.15 04/12/2023 0017   CALCIUM 8.5 (L) 04/12/2023 0017   GFRNONAA >60 04/12/2023 0017   GFRAA >60 07/30/2018 1045    Assessment/Planning: POD #3 s/p R pop to peroneal bypass with ips rGSV   Slowly recovering mobility Unclear timing of R 2nd toe amputation.  Suspect pt would do better with amputation this admission.  In addition, I am not certain that the 4th toe is providing any stability for ambulation Defer to Podiatry/Ortho Primary vascular team will be back tomorrow   Leonides Sake, MD, FACS, FSVS covering for [facility]  04/14/2023, 10:04 AM

## 2023-04-14 NOTE — Progress Notes (Signed)
Mobility Specialist - Progress Note    04/14/23 1438  Mobility  Activity Ambulated with assistance in hallway  Level of Assistance Contact guard assist, steadying assist  Distance Ambulated (ft) 50 ft  Range of Motion/Exercises Active  Activity Response Tolerated well  Mobility Referral Yes  $Mobility charge 1 Mobility  Mobility Specialist Start Time (ACUTE ONLY) 1417  Mobility Specialist Stop Time (ACUTE ONLY) 1438  Mobility Specialist Time Calculation (min) (ACUTE ONLY) 21 min   Pt resting in bed on RA upon entry. Pt STS and ambulates to hallway CGA with RW. Pt returned to recliner and left with needs in reach. Chair alarm activated.   Johnathan Hausen Mobility Specialist 04/14/23, 2:40 PM

## 2023-04-14 NOTE — Progress Notes (Signed)
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  This represents a limb threatening ischemia and places the patient at a high risk for limb loss. Patient is s/p arterial reconstruction, popliteal to tibial bypass using reversed great saphenous vein of the right lower extremity with the hope for limb salvage 04/11/23.    PT Comments  OOB with light assist. Stood to RW and walked 50' with slow steady gait.  Self limits distances and returns to bed after session.  Pt stated he worked nights and typically does not get up until noon/1:00 even though he is retired.    Assistance Recommended at Discharge    If plan is discharge home, recommend the following:  Can travel by private vehicle    A little help with walking and/or transfers;A little help with bathing/dressing/bathroom;Assist for transportation;Help with stairs or ramp for entrance      Equipment Recommendations  Rolling walker (2 wheels)    Recommendations for Other Services       Precautions / Restrictions Precautions Precautions: Fall Restrictions Weight Bearing Restrictions: No     Mobility  Bed Mobility Overal bed mobility: Needs Assistance Bed Mobility: Supine to Sit, Sit to Supine     Supine to sit: Supervision Sit to supine: Min assist   General bed mobility comments: asks for help getting LE's in to bed Patient Response: Cooperative  Transfers Overall transfer level: Needs assistance Equipment used: Rolling walker (2 wheels) Transfers: Sit to/from Stand Sit to Stand: Min guard                Ambulation/Gait Ambulation/Gait assistance: Min guard Gait Distance (Feet): 50 Feet Assistive device: Rolling walker (2 wheels) Gait  Pattern/deviations: Step-through pattern, Decreased step length - right, Decreased step length - left, Decreased stride length Gait velocity: decr     General Gait Details: patient ambulated well a short distance. Self limiting.   Stairs             Wheelchair Mobility     Tilt Bed Tilt Bed Patient Response: Cooperative  Modified Rankin (Stroke Patients Only)       Balance Overall balance assessment: Mild deficits observed, not formally tested                                          Cognition Arousal/Alertness: Awake/alert Behavior During Therapy: WFL for tasks assessed/performed Overall Cognitive Status: Within Functional Limits for tasks assessed                                          Exercises      General Comments        Pertinent Vitals/Pain Pain Assessment Pain Assessment: Faces Faces Pain Scale: Hurts a little bit Pain Location: R LE Pain Descriptors / Indicators: Discomfort, Sore Pain Intervention(s): Limited activity within patient's tolerance, Monitored during session, Repositioned    Home Living                          Prior Function  PT Goals (current goals can now be found in the care plan section) Progress towards PT goals: Progressing toward goals    Frequency    Min 1X/week      PT Plan Current plan remains appropriate    Co-evaluation              AM-PAC PT "6 Clicks" Mobility   Outcome Measure  Help needed turning from your back to your side while in a flat bed without using bedrails?: None Help needed moving from lying on your back to sitting on the side of a flat bed without using bedrails?: A Little Help needed moving to and from a bed to a chair (including a wheelchair)?: A Little Help needed standing up from a chair using your arms (e.g., wheelchair or bedside chair)?: A Little Help needed to walk in hospital room?: A Little Help needed climbing 3-5  steps with a railing? : A Little 6 Click Score: 19    End of Session Equipment Utilized During Treatment: Gait belt Activity Tolerance: Patient tolerated treatment well Patient left: in bed;with call bell/phone within reach;with bed alarm set;with family/visitor present Nurse Communication: Mobility status PT Visit Diagnosis: Other abnormalities of gait and mobility (R26.89);Difficulty in walking, not elsewhere classified (R26.2);Pain Pain - Right/Left: Right Pain - part of body: Leg     Time: 4401-0272 PT Time Calculation (min) (ACUTE ONLY): 9 min  Charges:    $Gait Training: 8-22 mins PT General Charges $$ ACUTE PT VISIT: 1 Visit                   Danielle Dess, PTA 04/14/23, 9:28 AM

## 2023-04-14 NOTE — Plan of Care (Signed)
  Problem: Education: Goal: Understanding of CV disease, CV risk reduction, and recovery process will improve Outcome: Progressing Goal: Individualized Educational Video(s) Outcome: Progressing   Problem: Activity: Goal: Ability to return to baseline activity level will improve Outcome: Progressing   Problem: Cardiovascular: Goal: Ability to achieve and maintain adequate cardiovascular perfusion will improve Outcome: Progressing Goal: Vascular access site(s) Level 0-1 will be maintained Outcome: Progressing   Problem: Health Behavior/Discharge Planning: Goal: Ability to safely manage health-related needs after discharge will improve Outcome: Progressing   Problem: Education: Goal: Knowledge of prescribed regimen will improve Outcome: Progressing   Problem: Activity: Goal: Ability to tolerate increased activity will improve Outcome: Progressing   Problem: Bowel/Gastric: Goal: Gastrointestinal status for postoperative course will improve Outcome: Progressing   Problem: Clinical Measurements: Goal: Postoperative complications will be avoided or minimized Outcome: Progressing Goal: Signs and symptoms of graft occlusion will improve Outcome: Progressing   Problem: Skin Integrity: Goal: Demonstration of wound healing without infection will improve Outcome: Progressing   Problem: Education: Goal: Knowledge of General Education information will improve Description: Including pain rating scale, medication(s)/side effects and non-pharmacologic comfort measures Outcome: Progressing   Problem: Health Behavior/Discharge Planning: Goal: Ability to manage health-related needs will improve Outcome: Progressing   Problem: Clinical Measurements: Goal: Ability to maintain clinical measurements within normal limits will improve Outcome: Progressing Goal: Will remain free from infection Outcome: Progressing Goal: Diagnostic test results will improve Outcome: Progressing Goal:  Respiratory complications will improve Outcome: Progressing Goal: Cardiovascular complication will be avoided Outcome: Progressing   Problem: Activity: Goal: Risk for activity intolerance will decrease Outcome: Progressing   Problem: Nutrition: Goal: Adequate nutrition will be maintained Outcome: Progressing   Problem: Coping: Goal: Level of anxiety will decrease Outcome: Progressing   Problem: Elimination: Goal: Will not experience complications related to bowel motility Outcome: Progressing Goal: Will not experience complications related to urinary retention Outcome: Progressing   Problem: Pain Managment: Goal: General experience of comfort will improve Outcome: Progressing   Problem: Safety: Goal: Ability to remain free from injury will improve Outcome: Progressing   Problem: Skin Integrity: Goal: Risk for impaired skin integrity will decrease Outcome: Progressing   

## 2023-04-15 ENCOUNTER — Telehealth (INDEPENDENT_AMBULATORY_CARE_PROVIDER_SITE_OTHER): Payer: Self-pay | Admitting: Vascular Surgery

## 2023-04-15 DIAGNOSIS — I96 Gangrene, not elsewhere classified: Secondary | ICD-10-CM | POA: Diagnosis not present

## 2023-04-15 MED ORDER — SODIUM CHLORIDE 0.9 % IV SOLN: INTRAVENOUS | Status: DC | PRN

## 2023-04-15 MED ORDER — CHLORHEXIDINE GLUCONATE CLOTH 2 % EX PADS
6.0000 | MEDICATED_PAD | Freq: Once | CUTANEOUS | Status: AC
  Administered 2023-04-15: 6 via TOPICAL

## 2023-04-15 MED ORDER — CEFAZOLIN SODIUM-DEXTROSE 2-4 GM/100ML-% IV SOLN
2.0000 g | INTRAVENOUS | Status: AC
  Administered 2023-04-16: 2 g via INTRAVENOUS
  Filled 2023-04-15: qty 100

## 2023-04-15 NOTE — Progress Notes (Signed)
Occupational Therapy Treatment Patient Details Name: [name] MRN: [id number] DOB: 09-07-1941 Today's Date: 04/15/2023   History of present illness patient is an 82 year old with evidence of severe atherosclerotic changes of both lower extremities associated with ulceration and tissue loss of the right foot.  This represents a limb threatening ischemia and places the patient at a high risk for limb loss. Patient is s/p arterial reconstruction, popliteal to tibial bypass using reversed great saphenous vein of the right lower extremity with the hope for limb salvage 04/11/23.   OT comments  Pt seen for OT tx. Pt denied pain throughout. Supv for bed mobility, CGA for ADL transfers from EOB and from std height toilet with use of grab bar. Pt required MIN A for sock mgt on RLE and anticipate MIN A for STS LB dressing. Pt/spouse educated in AE for LB dressing to improve access, comfort, and minimize risk of falls. Pt/spouse verbalized understanding, pt noting that a reacher would be a good idea to help with undergarments and pants. Pt progressing towards goals. Continues to benefit from skilled OT services.    Recommendations for follow up therapy are one component of a multi-disciplinary discharge planning process, led by the attending physician.  Recommendations may be updated based on patient status, additional functional criteria and insurance authorization.    Assistance Recommended at Discharge Intermittent Supervision/Assistance  Patient can return home with the following  A little help with walking and/or transfers;A little help with bathing/dressing/bathroom;Assistance with cooking/housework;Assist for transportation;Help with stairs or ramp for entrance   Equipment Recommendations  Other (comment) (RW, reacher)    Recommendations for Other Services      Precautions / Restrictions Precautions Precautions: Fall Precaution Comments: impaired vision 2/2 cataracts Restrictions Weight  Bearing Restrictions: No       Mobility Bed Mobility Overal bed mobility: Needs Assistance Bed Mobility: Supine to Sit, Sit to Supine     Supine to sit: Supervision, HOB elevated Sit to supine: Supervision        Transfers Overall transfer level: Needs assistance Equipment used: Rolling walker (2 wheels) Transfers: Sit to/from Stand Sit to Stand: Min guard                 Balance Overall balance assessment: Mild deficits observed, not formally tested                                         ADL either performed or assessed with clinical judgement   ADL Overall ADL's : Needs assistance/impaired                     Lower Body Dressing: Sitting/lateral leans;Minimal assistance Lower Body Dressing Details (indicate cue type and reason): MIN A for sock mgt on RLE, anticipate MIN A for STS LB dressing. Pt/spouse educated in AE for LB dressing to improve access, comfort, and minimize risk of falls. Toilet Transfer: Min guard;Ambulation;Regular Toilet;Rolling walker (2 wheels);Grab bars                  Extremity/Trunk Assessment              Vision       Perception     Praxis      Cognition Arousal/Alertness: Awake/alert Behavior During Therapy: WFL for tasks assessed/performed Overall Cognitive Status: Within Functional Limits for tasks assessed  Exercises      Shoulder Instructions       General Comments  (Discussed current home situation at length with pt and family. Education provided regarding importance of using a supportived "SPC" for balance while walking. Pt's home has limited walking space to use a RW.)    Pertinent Vitals/ Pain       Pain Assessment Pain Assessment: No/denies pain  Home Living                                          Prior Functioning/Environment              Frequency  Min 1X/week        Progress  Toward Goals  OT Goals(current goals can now be found in the care plan section)  Progress towards OT goals: Progressing toward goals  Acute Rehab OT Goals Patient Stated Goal: to decrease pain and go home OT Goal Formulation: With patient/family Time For Goal Achievement: 04/26/23 Potential to Achieve Goals: Good  Plan Discharge plan remains appropriate;Frequency remains appropriate    Co-evaluation                 AM-PAC OT "6 Clicks" Daily Activity     Outcome Measure   Help from another person eating meals?: None Help from another person taking care of personal grooming?: A Little Help from another person toileting, which includes using toliet, bedpan, or urinal?: A Little Help from another person bathing (including washing, rinsing, drying)?: A Little Help from another person to put on and taking off regular upper body clothing?: None Help from another person to put on and taking off regular lower body clothing?: A Little 6 Click Score: 20    End of Session Equipment Utilized During Treatment: Rolling walker (2 wheels)  OT Visit Diagnosis: Unsteadiness on feet (R26.81);Muscle weakness (generalized) (M62.81)   Activity Tolerance Patient tolerated treatment well   Patient Left in bed;with call bell/phone within reach;with bed alarm set;with family/visitor present   Nurse Communication          Time: 6010-9323 OT Time Calculation (min): 14 min  Charges: OT General Charges $OT Visit: 1 Visit OT Treatments $Self Care/Home Management : 8-22 mins  Arman Filter., MPH, MS, OTR/L ascom [phone] 04/15/23, 3:36 PM

## 2023-04-15 NOTE — Consult Note (Signed)
Subjective:  Patient ID: [name], male    DOB: 1941-06-16,  MRN: [id number]  Patient with past medical history of DM, HTN, and PAD seen at beside today for concern of right second toe gangrene. Patient known to podiatry surface and recently underwent vascular transluminal angioplasty right superficial femoral artery. Podiatry requested to plan for amputation of the toe while inhouse .   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   a.) TTE 08/27/2022: Ao sinus 4.1, asc Ao 3.7, ST junction 3.3   Atherosclerotic PVD with intermittent claudication (HCC)    a.) s/p vascular intervention 03/26/2018:  post tibial and peroneal crosser athrectomy, PTA LEFT post tibial, SFA, and popliteal arteries; b.) s/p PTA 03/20/2023: RIGHT SFA and popliteal arteries   Bilateral carotid artery disease (HCC) 08/22/2022   a.) carotid doppler 08/22/2022: <50 LICA, 50-69% RICA   Blurry vision, bilateral    CAD (coronary artery disease) 09/21/2022   a.) cCTA 09/21/2022: 591 (57 %ile for age/sex/race matched control)   Chest pain    Chronic heart failure with preserved ejection fraction (HFpEF) (HCC)    a.) TTE 08/27/2022: EF >55%, aym LVH, mild LAE, triv AR/PR, mild MR/TR. mild AS, Ao root dilatation, G1DD   CKD (chronic kidney disease), stage III (HCC)    Gangrene of toe of right foot (HCC)    a.) RIGHT second toe   Heart murmur    History of bilateral cataract extraction 2020   HLD (hyperlipidemia)    Hypertension    Incomplete right bundle branch block (RBBB)    Long term current use of antithrombotics/antiplatelets    a.) clopidogrel   Neuropathy    Palpitations    T2DM (type 2 diabetes mellitus) (HCC)    Vertigo    Wears dentures    Full upper and lower (loose)     Past Surgical History:  Procedure Laterality Date   AMPUTATION TOE Left 03/28/2018   Procedure: AMPUTATION  TOE/ PARTIAL RAY RESECTION-LEFT 2ND AND 3RD;  Surgeon: Linus Galas, DPM;  Location: [facility];  Service: Podiatry;  Laterality: Left;   CATARACT EXTRACTION W/PHACO Left 02/10/2019   Procedure: CATARACT EXTRACTION PHACO AND INTRAOCULAR LENS PLACEMENT (IOC)  LEFT DIABETIC;  Surgeon: Nevada Crane, MD;  Location: [facility];  Service: Ophthalmology;  Laterality: Left;  Diabetic - oral meds   CATARACT EXTRACTION W/PHACO Right 04/20/2019   Procedure: CATARACT EXTRACTION PHACO AND INTRAOCULAR LENS PLACEMENT (IOC)  RIGHT DIABETIC;  Surgeon: Nevada Crane, MD;  Location: [facility];  Service: Ophthalmology;  Laterality: Right;   FEMORAL-TIBIAL BYPASS GRAFT Right 04/11/2023   Procedure: BYPASS GRAFT FEMORAL-TIBIAL ARTERY ( POPLITEAL TO PERONEAL);  Surgeon: Renford Dills, MD;  Location: [facility];  Service: Vascular;  Laterality: Right;   HERNIA REPAIR     LOWER EXTREMITY ANGIOGRAPHY Left 03/26/2018   Procedure: Lower Extremity Angiography;  Surgeon: Renford Dills, MD;  Location: [facility];  Service: Cardiovascular;  Laterality: Left;   LOWER EXTREMITY ANGIOGRAPHY Right 03/20/2023   Procedure: Lower Extremity Angiography;  Surgeon: Renford Dills, MD;  Location: [facility];  Service: Cardiovascular;  Laterality: Right;   Percutaneous transluminal angioplasty right superficial femoral artery      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   Procedure: TRANSMETATARSAL AMPUTATION;  Surgeon: Recardo Evangelist, DPM;  Location: [facility];  Service: Podiatry;  Laterality: Left;  Latest Ref Rng & Units 04/12/2023   12:17 AM 04/11/2023    5:36 PM 04/05/2023   11:51 AM  CBC  WBC 4.0 - 10.5 K/uL 12.8  11.6  8.3   Hemoglobin 13.0 - 17.0 g/dL 75.6  43.3  29.5   Hematocrit 39.0 - 52.0 % 31.0  32.6  32.7   Platelets 150 - 400 K/uL 170  148  202        Latest Ref Rng & Units 04/12/2023   12:17 AM 04/11/2023    5:36 PM 04/05/2023   11:51 AM  BMP   Glucose 70 - 99 mg/dL 188   416   BUN 8 - 23 mg/dL 26   25   Creatinine 6.06 - 1.24 mg/dL 3.01  6.01  0.93   Sodium 135 - 145 mmol/L 136   138   Potassium 3.5 - 5.1 mmol/L 4.9   3.9   Chloride 98 - 111 mmol/L 107   105   CO2 22 - 32 mmol/L 21   25   Calcium 8.9 - 10.3 mg/dL 8.5   8.8      Objective:   Vitals:   04/15/23 1943 04/15/23 2021  BP: (!) 184/65 (!) 158/58  Pulse: 71 65  Resp: 18   Temp: 98.1 F (36.7 C)   SpO2: 99% 96%    General:AA&O x 3. Normal mood and affect   Vascular: DP and PT pulses 2/4 bilateral. Brisk capillary refill to all digits. Pedal hair present   Neruological. Epicritic sensation grossly intact.   Derm:  Right second digit with gangrenous changes noted and nonviable tisse. No surrounding erythema edema or purulence.   MSK: MMT 5/5 in dorsiflexion, plantar flexion, inversion and eversion. Normal joint ROM without pain or crepitus.      Assessment & Plan:  Patient was evaluated and treated and all questions answered.  DX: Right second digit gangrene  Discussed with patient diagnosis and treatment options.  Discussed amputation of the right second toe and patient in agreement with plan. He is optimized from vascular standpoint and clear for surgery.  Will plan on OR tomorrow with Dr. Logan Bores.  NPO after midnight.   Patient in agreement with plan and all questions answered.   Louann Sjogren, DPM  Accessible via secure chat for questions or concerns.

## 2023-04-15 NOTE — Progress Notes (Signed)
Progress Note    04/15/2023 2:10 PM 4 Days Post-Op  Subjective: [name] is an 82 year old male now postop day 4 from a right below the knee popliteal artery to peroneal artery bypass graft with reverse saphenous vein graft.  Patient's daughter-in-law was at the bedside with him this morning.  Patient is resting comfortably in bed.  States that he has walked over the weekend with some pain but it was tolerable.  Patient states that he is ready to go home.  Patient's wife this morning called the office asking that the patient have physical therapy on discharge outpatient to [facility] not at home.  On exam this morning patient was resting comfortably in bed with his daughter-in-law at bedside.  Patient's right lower extremity dressings were taken off.  Staple line underneath with clean dry and intact without any serous drainage to note.  No erythema swelling noted as well.  Patient's second toe on his right foot remains gangrenous and odorous.  No drainage noted.  Fourth toes are also in a distorted position but his foot remains warm with palpable posttibial artery.  No complaints overnight vitals all remained stable.   Vitals:   04/15/23 0415 04/15/23 0753  BP: (!) 155/57 131/63  Pulse: 64 (!) 58  Resp: 18 16  Temp: 98.1 F (36.7 C) 98.3 F (36.8 C)  SpO2: 98% 98%   Physical Exam: Cardiac:  RRR, Normal S1, S2 No murmurs Lungs: Clear throughout on auscultation but diminished in the bases.  No rales, rhonchi, or wheezing noted. Incisions: Right lower leg has 2 areas of incision.  Upper thigh area closed with staples is clean dry and intact.  Right lower below the knee closed with staples, clean dry and intact.  Dressings removed this morning. Extremities: Right lower extremity operated on 2 areas.  Thigh with staples clean dry and intact below the knee with staples clean dry and intact.  Patient has strong Doppler pulses both PT and DP this morning. Abdomen: Positive bowel  sounds throughout, soft, nontender and nondistended. Neurologic: Alert and oriented x 2.  Patient follows all commands and is very demanding.  CBC    Component Value Date/Time   WBC 12.8 (H) 04/12/2023 0017   RBC 3.45 (L) 04/12/2023 0017   HGB 10.3 (L) 04/12/2023 0017   HCT 31.0 (L) 04/12/2023 0017   PLT 170 04/12/2023 0017   MCV 89.9 04/12/2023 0017   MCH 29.9 04/12/2023 0017   MCHC 33.2 04/12/2023 0017   RDW 13.1 04/12/2023 0017   LYMPHSABS 2.3 04/05/2023 1151   MONOABS 0.6 04/05/2023 1151   EOSABS 0.4 04/05/2023 1151   BASOSABS 0.1 04/05/2023 1151    BMET    Component Value Date/Time   NA 136 04/12/2023 0017   K 4.9 04/12/2023 0017   CL 107 04/12/2023 0017   CO2 21 (L) 04/12/2023 0017   GLUCOSE 169 (H) 04/12/2023 0017   BUN 26 (H) 04/12/2023 0017   CREATININE 1.15 04/12/2023 0017   CALCIUM 8.5 (L) 04/12/2023 0017   GFRNONAA >60 04/12/2023 0017   GFRAA >60 07/30/2018 1045    INR    Component Value Date/Time   INR 1.2 03/19/2023 1518     Intake/Output Summary (Last 24 hours) at 04/15/2023 1410 Last data filed at 04/15/2023 0426 Gross per 24 hour  Intake 480 ml  Output 500 ml  Net -20 ml     Assessment/Plan:  82 y.o. male is s/p ight below the knee popliteal artery to peroneal artery bypass  graft with reverse saphenous vein graft.   4 Days Post-Op   PLAN: Patient is recovering as expected.  Right lower extremity with staples clean dry and intact. Continue PT and OT as ordered. Pain medications as needed. Will contact Dr. Logan Bores from podiatry to see if he wants to perform toe amputation while patient is in the hospital. Continue inpatient aspirin 81 mg daily and Plavix 75 mg daily.  DVT prophylaxis: Lovenox 40 mg SQ every 24 hours.   Marcie Bal [facility] 04/15/2023 2:10 PM

## 2023-04-15 NOTE — Progress Notes (Signed)
Physical Therapy Treatment Patient Details Name: [name] MRN: [id number] DOB: 17-Jul-1941 Today's Date: 04/15/2023   History of Present Illness patient is an 82 year old with evidence of severe atherosclerotic changes of both lower extremities associated with ulceration and tissue loss of the right foot.  This represents a limb threatening ischemia and places the patient at a high risk for limb loss. Patient is s/p arterial reconstruction, popliteal to tibial bypass using reversed great saphenous vein of the right lower extremity with the hope for limb salvage 04/11/23.    PT Comments  Pt continues to participate well during PT sessions. Gait distance increased to 180' today with use of RW and Supervision. No c/o pain. Short distance gait in room w/o AD with SBA for safety. Pt has several canes at home for use. Unable to utilize RW at home due to home clutter only allowing small paths throughout home. Pt educated on utilizing SPC instead of home made walking stick. Current plan is for toe amputation during this admission with transition to Out-pt PT at [facility], daughter-in-law to provide transportation.   If plan is discharge home, recommend the following: A little help with walking and/or transfers;A little help with bathing/dressing/bathroom;Assist for transportation;Help with stairs or ramp for entrance   Can travel by private vehicle        Equipment Recommendations  None recommended by PT (Pt has several canes at home and cannot maneuver a RW in "walking space" at home)    Recommendations for Other Services       Precautions / Restrictions Precautions Precautions: Fall Precaution Comments:  (impaired vision due to cataracts) Restrictions Weight Bearing Restrictions: No     Mobility  Bed Mobility Overal bed mobility: Needs Assistance Bed Mobility: Supine to Sit     Supine to sit: Supervision, HOB elevated          Transfers Overall transfer level: Needs  assistance Equipment used: Rolling walker (2 wheels) Transfers: Sit to/from Stand Sit to Stand: Min guard           General transfer comment: cues for hand placement    Ambulation/Gait Ambulation/Gait assistance: Min guard Gait Distance (Feet):  (180) Assistive device: Rolling walker (2 wheels) Gait Pattern/deviations: Step-through pattern, Decreased step length - right, Decreased step length - left, Decreased stride length Gait velocity: decr     General Gait Details:  (Increased tolerance for gait distance this session)   Stairs             Wheelchair Mobility     Tilt Bed    Modified Rankin (Stroke Patients Only)       Balance                                            Cognition Arousal/Alertness: Awake/alert Behavior During Therapy: WFL for tasks assessed/performed Overall Cognitive Status: Within Functional Limits for tasks assessed                                 General Comments:  (Very pleasant)        Exercises      General Comments General comments (skin integrity, edema, etc.):  (Discussed current home situation at length with pt and family. Education provided regarding importance of using a supportived "SPC" for balance while walking. Pt's home  has limited walking space to use a RW.)      Pertinent Vitals/Pain Pain Assessment Pain Assessment: No/denies pain    Home Living                          Prior Function            PT Goals (current goals can now be found in the care plan section) Acute Rehab PT Goals Patient Stated Goal: to return home Progress towards PT goals: Progressing toward goals    Frequency    Min 1X/week      PT Plan Current plan remains appropriate    Co-evaluation              AM-PAC PT "6 Clicks" Mobility   Outcome Measure  Help needed turning from your back to your side while in a flat bed without using bedrails?: None Help needed moving from  lying on your back to sitting on the side of a flat bed without using bedrails?: A Little Help needed moving to and from a bed to a chair (including a wheelchair)?: A Little Help needed standing up from a chair using your arms (e.g., wheelchair or bedside chair)?: A Little Help needed to walk in hospital room?: A Little Help needed climbing 3-5 steps with a railing? : A Little 6 Click Score: 19    End of Session Equipment Utilized During Treatment: Gait belt Activity Tolerance: Patient tolerated treatment well Patient left: in chair;with call bell/phone within reach;with chair alarm set;with family/visitor present Nurse Communication: Mobility status PT Visit Diagnosis: Other abnormalities of gait and mobility (R26.89);Difficulty in walking, not elsewhere classified (R26.2);Pain Pain - Right/Left: Right Pain - part of body: Leg     Time: 1240-1307 PT Time Calculation (min) (ACUTE ONLY): 27 min  Charges:    $Gait Training: 8-22 mins $Therapeutic Activity: 8-22 mins PT General Charges $$ ACUTE PT VISIT: 1 Visit                    Zadie Cleverly, PTA  Jannet Askew 04/15/2023, 1:22 PM

## 2023-04-15 NOTE — Telephone Encounter (Signed)
Patients wife called in stating that she was told that while patient is in the hospital in order to get PT set up she needed to get in contact with Dr. Gilda Crease. Patient is wanting to do PT at [facility] clinic.     Please call and advise  Patient is going to the hospital now to be with the patient and may not have service if you try and call but you can LVM or she will call the office bak

## 2023-04-15 NOTE — Care Management Important Message (Signed)
Important Message  Patient Details  Name: [name] MRN: [id number] Date of Birth: 02-Jul-1941   Medicare Important Message Given:  Yes     Hailly Fess, Stephan Minister 04/15/2023, 3:24 PM

## 2023-04-16 ENCOUNTER — Encounter: Payer: Self-pay | Admitting: Vascular Surgery

## 2023-04-16 ENCOUNTER — Inpatient Hospital Stay: Payer: Medicare Other

## 2023-04-16 ENCOUNTER — Inpatient Hospital Stay: Payer: Medicare Other | Admitting: Anesthesiology

## 2023-04-16 ENCOUNTER — Encounter
Admission: RE | Disposition: A | Discharge status: Home / Self Care | Payer: Self-pay | Source: Home / Self Care | Attending: Vascular Surgery

## 2023-04-16 DIAGNOSIS — M86171 Other acute osteomyelitis, right ankle and foot: Secondary | ICD-10-CM

## 2023-04-16 HISTORY — PX: AMPUTATION TOE: SHX6595

## 2023-04-16 LAB — GLUCOSE, CAPILLARY
Glucose-Capillary: 79 mg/dL (ref 70–99)
Glucose-Capillary: 108 mg/dL — ABNORMAL HIGH (ref 70–99)

## 2023-04-16 SURGERY — AMPUTATION, TOE
Anesthesia: General | Site: Toe | Laterality: Right

## 2023-04-16 MED ORDER — DEXMEDETOMIDINE HCL IN NACL 80 MCG/20ML IV SOLN
INTRAVENOUS | Status: AC | PRN
Start: 2023-04-16 — End: ?
  Administered 2023-04-16: 8 ug via INTRAVENOUS

## 2023-04-16 MED ORDER — BUPIVACAINE HCL (PF) 0.5 % IJ SOLN
INTRAMUSCULAR | Status: AC
  Filled 2023-04-16: qty 30

## 2023-04-16 MED ORDER — EPHEDRINE SULFATE (PRESSORS) 50 MG/ML IJ SOLN
INTRAMUSCULAR | Status: DC | PRN
  Administered 2023-04-16: 15 mg via INTRAVENOUS

## 2023-04-16 MED ORDER — PROPOFOL 500 MG/50ML IV EMUL
INTRAVENOUS | Status: DC | PRN
  Administered 2023-04-16: 80 ug/kg/min via INTRAVENOUS

## 2023-04-16 MED ORDER — PROPOFOL 10 MG/ML IV BOLUS
INTRAVENOUS | Status: DC | PRN
  Administered 2023-04-16: 20 mg via INTRAVENOUS
  Administered 2023-04-16: 50 mg via INTRAVENOUS

## 2023-04-16 MED ORDER — BUPIVACAINE HCL 0.5 % IJ SOLN
INTRAMUSCULAR | Status: DC | PRN
  Administered 2023-04-16: 10 mL

## 2023-04-16 MED ORDER — LIDOCAINE HCL (PF) 1 % IJ SOLN
INTRAMUSCULAR | Status: DC | PRN
  Administered 2023-04-16: 10 mL

## 2023-04-16 MED ORDER — EPHEDRINE 5 MG/ML INJ
INTRAVENOUS | Status: AC
  Filled 2023-04-16: qty 5

## 2023-04-16 MED ORDER — LIDOCAINE HCL (PF) 2 % IJ SOLN
INTRAMUSCULAR | Status: DC | PRN
Start: 2023-04-16 — End: 2023-04-17
  Administered 2023-04-16: 80 mg via INTRADERMAL

## 2023-04-16 MED ORDER — LIDOCAINE HCL (PF) 1 % IJ SOLN
INTRAMUSCULAR | Status: AC
  Filled 2023-04-16: qty 30

## 2023-04-16 SURGICAL SUPPLY — 52 items
BLADE MED AGGRESSIVE (BLADE) ×1 IMPLANT
BLADE OSC/SAGITTAL MD 5.5X18 (BLADE) IMPLANT
BLADE SURG 15 STRL LF DISP TIS (BLADE) IMPLANT
BLADE SURG 15 STRL SS (BLADE)
BLADE SURG MINI STRL (BLADE) IMPLANT
BNDG CMPR 5X4 KNIT ELC UNQ LF (GAUZE/BANDAGES/DRESSINGS) ×1
BNDG CMPR 75X21 PLY HI ABS (MISCELLANEOUS)
BNDG ELASTIC 4INX 5YD STR LF (GAUZE/BANDAGES/DRESSINGS) ×1 IMPLANT
BNDG ESMARCH 4 X 12 STRL LF (GAUZE/BANDAGES/DRESSINGS)
BNDG ESMARCH 4X12 STRL LF (GAUZE/BANDAGES/DRESSINGS) ×1 IMPLANT
BNDG GAUZE DERMACEA FLUFF 4 (GAUZE/BANDAGES/DRESSINGS) ×1 IMPLANT
BNDG GZE DERMACEA 4 6PLY (GAUZE/BANDAGES/DRESSINGS) ×1
CNTNR URN SCR LID CUP LEK RST (MISCELLANEOUS) ×1 IMPLANT
CONT SPEC 4OZ STRL OR WHT (MISCELLANEOUS) ×1
CUFF TOURN SGL QUICK 12 (TOURNIQUET CUFF) IMPLANT
CUFF TOURN SGL QUICK 18X4 (TOURNIQUET CUFF) IMPLANT
DRAPE FLUOR MINI C-ARM 54X84 (DRAPES) IMPLANT
DURAPREP 26ML APPLICATOR (WOUND CARE) ×1 IMPLANT
ELECT REM PT RETURN 9FT ADLT (ELECTROSURGICAL) ×1
ELECTRODE REM PT RTRN 9FT ADLT (ELECTROSURGICAL) ×1 IMPLANT
GAUZE SPONGE 4X4 12PLY STRL (GAUZE/BANDAGES/DRESSINGS) ×2 IMPLANT
GAUZE STRETCH 2X75IN STRL (MISCELLANEOUS) IMPLANT
GAUZE XEROFORM 1X8 LF (GAUZE/BANDAGES/DRESSINGS) ×1 IMPLANT
GLOVE BIO SURGEON STRL SZ8 (GLOVE) ×1 IMPLANT
GLOVE BIOGEL PI IND STRL 8 (GLOVE) ×1 IMPLANT
GOWN STRL REUS W/ TWL LRG LVL3 (GOWN DISPOSABLE) ×2 IMPLANT
GOWN STRL REUS W/TWL LRG LVL3 (GOWN DISPOSABLE) ×2
HANDPIECE VERSAJET DEBRIDEMENT (MISCELLANEOUS) IMPLANT
IV NS IRRIG 3000ML ARTHROMATIC (IV SOLUTION) IMPLANT
KIT TURNOVER KIT A (KITS) ×1 IMPLANT
LABEL OR SOLS (LABEL) ×1 IMPLANT
MANIFOLD NEPTUNE II (INSTRUMENTS) ×1 IMPLANT
MICROMATRIX 1000MG (Tissue) ×1 IMPLANT
NDL FILTER BLUNT 18X1 1/2 (NEEDLE) ×1 IMPLANT
NDL HYPO 25X1 1.5 SAFETY (NEEDLE) ×2 IMPLANT
NEEDLE FILTER BLUNT 18X1 1/2 (NEEDLE) IMPLANT
NEEDLE HYPO 25X1 1.5 SAFETY (NEEDLE) ×2 IMPLANT
NS IRRIG 500ML POUR BTL (IV SOLUTION) ×1 IMPLANT
PACK EXTREMITY ARMC (MISCELLANEOUS) ×1 IMPLANT
SOL PREP PVP 2OZ (MISCELLANEOUS) ×1
SOLUTION PARTIC MCRMTRX 1000MG (Tissue) IMPLANT
SOLUTION PREP PVP 2OZ (MISCELLANEOUS) ×1 IMPLANT
STOCKINETTE STRL 6IN 960660 (GAUZE/BANDAGES/DRESSINGS) ×1 IMPLANT
SUT ETHILON 3-0 FS-10 30 BLK (SUTURE)
SUT PROLENE 3 0 PS 2 (SUTURE) IMPLANT
SUT VIC AB 3-0 SH 27 (SUTURE)
SUT VIC AB 3-0 SH 27X BRD (SUTURE) ×1 IMPLANT
SUTURE EHLN 3-0 FS-10 30 BLK (SUTURE) ×2 IMPLANT
SWAB CULTURE AMIES ANAERIB BLU (MISCELLANEOUS) IMPLANT
SYR 10ML LL (SYRINGE) ×1 IMPLANT
TRAP FLUID SMOKE EVACUATOR (MISCELLANEOUS) ×1 IMPLANT
WATER STERILE IRR 500ML POUR (IV SOLUTION) ×1 IMPLANT

## 2023-04-16 NOTE — Op Note (Signed)
   OPERATIVE REPORT Patient name: [name] MRN: [id number]  DOB: 1940/12/12  DOS: 04/16/23   Preop Dx: Osteomyelitis right second toe Postop Dx: same  Procedure:  1.  Amputation right second toe  Surgeon: Felecia Shelling DPM  Anesthesia: 50-50 mixture of 2% lidocaine plain with 0.5% Marcaine plain totaling 10 mL infiltrated in the patient's lower extremity in a digital block fashion  Hemostasis: Calf tourniquet inflated to a pressure of after esmarch exsanguination   EBL: Minimal mL Materials: Acell Micromatrix Injectables: None Pathology: Right second toe  Condition: The patient tolerated the procedure and anesthesia well. No complications noted or reported   Justification for procedure: The patient is a 82 y.o. who presents today for surgical correction of osteomyelitis to the right second toe.  Patient has failed conservative treatment.  Risk benefits advantages disadvantages discussed. The patient consented for surgical correction. The patient consent form was reviewed. All patient questions were answered. No guarantees were expressed or implied. The patient and the surgeon both signed the patient consent form with the witness present and placed in the patient's chart.   Procedure in Detail: The patient was brought to the operating room, placed in the operating table in the supine position at which time an aseptic scrub and drape were performed about the patient's respective lower extremity after anesthesia was induced as described above. Attention was then directed to the surgical area where procedure number one commenced.  Procedure #1: Amputation right second toe A fishmouth type incision was planned and made around the MTP of the surgical toe.  Incision was carried down to the level of bone and joint capsule using a #15 scalpel.  The toe was grasped with a perforating towel clamp and distracted distally and disarticulated at the level of the MTP joint.  The toe  was removed in toto and placed in sterile specimen container.  Any exposed tendons were distracted and cut as far proximal as could be visualized.  Clean margins. Copious irrigation was then utilized in preparation for primary closure.  Acell Micromatrix was applied within the amputation site to promote healing.  3-0 prolene suture was utilized to reapproximate superficial skin edges.  Dry sterile compressive dressings were then applied to all previously mentioned incision sites about the patient's lower extremity. The tourniquet which was used for hemostasis was deflated. All normal neurovascular responses including pink color and warmth returned all the remaining digits of patient's lower extremity.  The patient was then transferred from the operating room to the recovery room having tolerated the procedure and anesthesia well. All vital signs are stable. After a brief stay in the recovery room the patient was readmitted to inpatient room.  Dressings clean dry and intact.  Weightbearing as tolerated in a cam boot.  IMPRESSION: Clean margins  Felecia Shelling, DPM [facility]  Dr. Felecia Shelling, DPM    [address] [place], [place] [address]                Office [phone]  Fax [phone]

## 2023-04-16 NOTE — Progress Notes (Signed)
  Progress Note    04/16/2023 10:24 AM Day of Surgery  Subjective:  [name] is an 82 year old male now postop day 4 from a right below the knee popliteal artery to peroneal artery bypass graft with reverse saphenous vein graft. Patient's wife is at the bedside today. Patient will be going to the operating room today with Dr Logan Bores for second great toe amputation.    Vitals:   04/16/23 1000 04/16/23 1015  BP: (!) 93/49 (!) 127/59  Pulse: 66 62  Resp: 14 17  Temp: 98.3 F (36.8 C)   SpO2: 98% 98%   Physical Exam: Cardiac:  RRR, Normal S1, S2 No murmurs Lungs: Clear throughout on auscultation but diminished in the bases.  No rales, rhonchi, or wheezing noted. Incisions: Right lower leg has 2 areas of incision.  Upper thigh area closed with staples is clean dry and intact.  Right lower below the knee closed with staples, clean dry and intact.  Dressings removed this morning. Extremities: Right lower extremity operated on 2 areas.  Thigh with staples clean dry and intact below the knee with staples clean dry and intact.  Patient has strong Doppler pulses both PT and DP this morning. Abdomen: Positive bowel sounds throughout, soft, nontender and nondistended. Neurologic: Alert and oriented x 2.  Patient follows all commands and is very demanding.  CBC    Component Value Date/Time   WBC 12.8 (H) 04/12/2023 0017   RBC 3.45 (L) 04/12/2023 0017   HGB 10.3 (L) 04/12/2023 0017   HCT 31.0 (L) 04/12/2023 0017   PLT 170 04/12/2023 0017   MCV 89.9 04/12/2023 0017   MCH 29.9 04/12/2023 0017   MCHC 33.2 04/12/2023 0017   RDW 13.1 04/12/2023 0017   LYMPHSABS 2.3 04/05/2023 1151   MONOABS 0.6 04/05/2023 1151   EOSABS 0.4 04/05/2023 1151   BASOSABS 0.1 04/05/2023 1151    BMET    Component Value Date/Time   NA 136 04/12/2023 0017   K 4.9 04/12/2023 0017   CL 107 04/12/2023 0017   CO2 21 (L) 04/12/2023 0017   GLUCOSE 169 (H) 04/12/2023 0017   BUN 26 (H) 04/12/2023 0017    CREATININE 1.15 04/12/2023 0017   CALCIUM 8.5 (L) 04/12/2023 0017   GFRNONAA >60 04/12/2023 0017   GFRAA >60 07/30/2018 1045    INR    Component Value Date/Time   INR 1.2 03/19/2023 1518     Intake/Output Summary (Last 24 hours) at 04/16/2023 1024 Last data filed at 04/16/2023 1002 Gross per 24 hour  Intake 390 ml  Output 950 ml  Net -560 ml     Assessment/Plan:  82 y.o. male is s/p  Right below the knee popliteal artery to peroneal artery bypass graft with reverse saphenous vein graft.   Day of Surgery   PLAN: Patient is recovering as expected.  Right lower extremity with staples clean dry and intact. Continue PT and OT as ordered. Pain medications as needed. Patient to the Operating room today for right second great toe amputation.  Discharge plan to home with follow up outpatient physical therapy.  Patient to be discharged on ASA 81 mg daily and Plavix 75 mg daily and Crestor 10 mg Daily  DVT prophylaxis:  Lovenox 40 mg SQ Q24hrs    Camellia Popescu R Terrian [facility] 04/16/2023 10:24 AM

## 2023-04-16 NOTE — Plan of Care (Signed)
  Problem: Education: Goal: Understanding of CV disease, CV risk reduction, and recovery process will improve Outcome: Progressing Goal: Individualized Educational Video(s) Outcome: Progressing   Problem: Activity: Goal: Ability to return to baseline activity level will improve Outcome: Progressing   Problem: Cardiovascular: Goal: Ability to achieve and maintain adequate cardiovascular perfusion will improve Outcome: Progressing Goal: Vascular access site(s) Level 0-1 will be maintained Outcome: Progressing   Problem: Health Behavior/Discharge Planning: Goal: Ability to safely manage health-related needs after discharge will improve Outcome: Progressing   Problem: Education: Goal: Knowledge of prescribed regimen will improve Outcome: Progressing   Problem: Activity: Goal: Ability to tolerate increased activity will improve Outcome: Progressing   Problem: Bowel/Gastric: Goal: Gastrointestinal status for postoperative course will improve Outcome: Progressing   Problem: Clinical Measurements: Goal: Postoperative complications will be avoided or minimized Outcome: Progressing Goal: Signs and symptoms of graft occlusion will improve Outcome: Progressing   Problem: Skin Integrity: Goal: Demonstration of wound healing without infection will improve Outcome: Progressing   Problem: Education: Goal: Knowledge of General Education information will improve Description: Including pain rating scale, medication(s)/side effects and non-pharmacologic comfort measures Outcome: Progressing   Problem: Health Behavior/Discharge Planning: Goal: Ability to manage health-related needs will improve Outcome: Progressing   Problem: Clinical Measurements: Goal: Ability to maintain clinical measurements within normal limits will improve Outcome: Progressing Goal: Will remain free from infection Outcome: Progressing Goal: Diagnostic test results will improve Outcome: Progressing Goal:  Respiratory complications will improve Outcome: Progressing Goal: Cardiovascular complication will be avoided Outcome: Progressing   Problem: Activity: Goal: Risk for activity intolerance will decrease Outcome: Progressing   Problem: Nutrition: Goal: Adequate nutrition will be maintained Outcome: Progressing   Problem: Coping: Goal: Level of anxiety will decrease Outcome: Progressing   Problem: Elimination: Goal: Will not experience complications related to bowel motility Outcome: Progressing Goal: Will not experience complications related to urinary retention Outcome: Progressing   Problem: Pain Managment: Goal: General experience of comfort will improve Outcome: Progressing   Problem: Safety: Goal: Ability to remain free from injury will improve Outcome: Progressing   Pt ao x4, respirations even and unlabored on RA. Pt had some pain after procedure, controlled well with percocet. Pt up ambulating with surgical shoe, walker and assist; tolerated well. Dressing CDI. Pt tolerating diet without issue. Pt now resting in bed and has no complaints at this time

## 2023-04-16 NOTE — Progress Notes (Signed)
Pt back from procedure. Pt on arrival to unit is  AO x4, respirations even and unlabored on RA. Pt having some pain to R foot, percocet given. Vital signs stable. Pt tolerated fluids without issue. Surgical shoe ordered for pt. Pt now resting in bed with wife at bedside.

## 2023-04-16 NOTE — Brief Op Note (Signed)
04/16/2023  10:23 AM  PATIENT:  [name]  82 y.o. male  PRE-OPERATIVE DIAGNOSIS:  Right second toe gangrene  POST-OPERATIVE DIAGNOSIS:  Right second toe gangrene  PROCEDURE:  Procedure(s): AMPUTATION TOE (Right)  SURGEON:  Surgeons and Role:    Felecia Shelling, DPM - Primary  PHYSICIAN ASSISTANT: None  ASSISTANTS: none   ANESTHESIA:   local and IV sedation  EBL: Minimal  BLOOD ADMINISTERED:none  DRAINS: none   LOCAL MEDICATIONS USED:  MARCAINE   , LIDOCAINE , and Amount: 6 ml  SPECIMEN:  Source of Specimen:  Right second toe  DISPOSITION OF SPECIMEN:  PATHOLOGY  COUNTS:  YES  TOURNIQUET:  * No tourniquets in log *  DICTATION: .Dragon Dictation  PLAN OF CARE: Admit to inpatient   PATIENT DISPOSITION:  PACU - hemodynamically stable.   Delay start of Pharmacological VTE agent (>24hrs) due to surgical blood loss or risk of bleeding: no

## 2023-04-16 NOTE — Progress Notes (Signed)
OT Cancellation Note  Patient Details Name: [name] MRN: [id number] DOB: 1940/11/01   Cancelled Treatment:    Reason Eval/Treat Not Completed: Patient at procedure or test/ unavailable. Chart reviewed. Pt noted to be in OR. Will require new therapy orders or continue on transfer orders to continue therapy once medically appropriate.   Arman Filter., MPH, MS, OTR/L ascom [phone] 04/16/23, 9:45 AM

## 2023-04-16 NOTE — Anesthesia Preprocedure Evaluation (Signed)
Anesthesia Evaluation  Patient identified by MRN, date of birth, ID band Patient awake    Reviewed: Allergy & Precautions, NPO status , Patient's Chart, lab work & pertinent test results  History of Anesthesia Complications Negative for: history of anesthetic complications  Airway Mallampati: III   Neck ROM: Full    Dental  (+) Edentulous Upper, Edentulous Lower   Pulmonary former smoker   Pulmonary exam normal breath sounds clear to auscultation       Cardiovascular hypertension, + CAD, + Peripheral Vascular Disease (bilateral carotid disease; LE atherosclerosis with claudication) and +CHF  + Valvular Problems/Murmurs (mild AS) AS  Rhythm:Regular Rate:Normal + Systolic murmurs ECG 03/19/23: Sinus rhythm Incomplete right bundle branch block Borderline prolonged QT interval  Echo 08/27/22:  NORMAL LEFT VENTRICULAR SYSTOLIC FUNCTION WITH AN ESTIMATED EF = >55 %  NORMAL RIGHT VENTRICULAR SYSTOLIC FUNCTION  MILD VALVULAR REGURGITATION MILD AORTIC VALVE STENOSIS WITH A CALCULATED AVA = 1.1 cm^2  MILD LA ENLARGEMENT  MILDLY DILATED AORTIC ROOT  GLS: -17.9 %    Neuro/Psych Vertigo   Neuromuscular disease (neuropathy)    GI/Hepatic negative GI ROS,,,  Endo/Other  diabetes, Type 2    Renal/GU Renal disease     Musculoskeletal   Abdominal   Peds  Hematology negative hematology ROS (+)   Anesthesia Other Findings Reviewed and agree with Edd Fabian pre-anesthesia clinical review note.    Cardiology note 03/29/23:  82 y.o. male with  1. Palpitations  2. Chest pain at rest  3. Coronary artery disease, unspecified vessel or lesion type, unspecified whether angina present, unspecified whether native or transplanted heart  4. Murmur  5. Vertigo  6. PVD (peripheral vascular disease) (CMS-HCC)  7. Mixed hyperlipidemia  8. Essential hypertension  9. Controlled type 2 diabetes mellitus with diabetic peripheral angiopathy  without gangrene, without long-term current use of insulin (CMS/HHS-HCC)  10. SOB (shortness of breath)  11. Neuropathy  12. Myalgia  13. Pre-operative clearance   Plan  Preoperative clearance for total lower extremity treatment by vascular with Dr. Gilda Crease patient has 2 ischemia every need revascularization Patient appears to be an acceptable moderate risk for the procedure Vertigo intermittent recurrent consider meclizine as needed for symptom management Diabetes type 2 uncomplicated continue current medical Hypertension poor control consider advancing beta-blocker ACE or ARB with diuretic as well as statin therapy Peripheral vascular disease preop for intervention recommend statin therapy aspirin possible Plavix have the patient follow-up 1 month  Return in about 6 months (around 09/29/2023).    Reproductive/Obstetrics                             Anesthesia Physical Anesthesia Plan  ASA: 3  Anesthesia Plan: General   Post-op Pain Management:    Induction: Intravenous  PONV Risk Score and Plan: 2 and Ondansetron, Dexamethasone, TIVA and Propofol infusion  Airway Management Planned: Nasal Cannula and Natural Airway  Additional Equipment:   Intra-op Plan:   Post-operative Plan: Extubation in OR  Informed Consent: I have reviewed the patients History and Physical, chart, labs and discussed the procedure including the risks, benefits and alternatives for the proposed anesthesia with the patient or authorized representative who has indicated his/her understanding and acceptance.     Dental advisory given  Plan Discussed with: CRNA  Anesthesia Plan Comments: (Patient consented for risks of anesthesia including but not limited to:  - adverse reactions to medications - damage to eyes, teeth, lips or other oral  mucosa - nerve damage due to positioning  - sore throat or hoarseness - damage to heart, brain, nerves, lungs, other parts of body or loss  of life  Informed patient about role of CRNA in peri- and intra-operative care.  Patient voiced understanding.)       Anesthesia Quick Evaluation

## 2023-04-17 ENCOUNTER — Encounter: Payer: Self-pay | Admitting: Podiatry

## 2023-04-17 ENCOUNTER — Telehealth: Payer: Self-pay | Admitting: Podiatry

## 2023-04-17 DIAGNOSIS — I96 Gangrene, not elsewhere classified: Secondary | ICD-10-CM | POA: Diagnosis not present

## 2023-04-17 MED ORDER — DOCUSATE SODIUM 100 MG PO CAPS: 100.0000 mg | ORAL_CAPSULE | Freq: Every day | ORAL | 0 refills | Status: DC

## 2023-04-17 MED ORDER — ASPIRIN 81 MG PO TBEC: 81.0000 mg | DELAYED_RELEASE_TABLET | Freq: Every day | ORAL | 12 refills | Status: AC

## 2023-04-17 MED ORDER — ROSUVASTATIN CALCIUM 10 MG PO TABS: 10.0000 mg | ORAL_TABLET | Freq: Every day | ORAL | 11 refills | Status: AC

## 2023-04-17 MED ORDER — AMOXICILLIN-POT CLAVULANATE 500-125 MG PO TABS: 1.0000 | ORAL_TABLET | Freq: Three times a day (TID) | ORAL | 0 refills | Status: DC

## 2023-04-17 MED ORDER — OXYCODONE-ACETAMINOPHEN 5-325 MG PO TABS: 1.0000 | ORAL_TABLET | ORAL | 0 refills | Status: DC | PRN

## 2023-04-17 NOTE — Discharge Summary (Signed)
[facility]    Discharge Summary    Patient ID:  [name] MRN: [id number] DOB/AGE: 82-26-1942 82 y.o.  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   a.) TTE 08/27/2022: Ao sinus 4.1, asc Ao 3.7, ST junction 3.3   Atherosclerotic PVD with intermittent claudication (HCC)    a.) s/p vascular intervention 03/26/2018:  post tibial and peroneal crosser athrectomy, PTA LEFT post tibial, SFA, and popliteal arteries; b.) s/p PTA 03/20/2023: RIGHT SFA and popliteal arteries   Bilateral carotid artery disease (HCC) 08/22/2022   a.) carotid doppler 08/22/2022: <50 LICA, 50-69% RICA   Blurry vision, bilateral    CAD (coronary artery disease) 09/21/2022   a.) cCTA 09/21/2022: 591 (57 %ile for age/sex/race matched control)   Chest pain    Chronic heart failure with preserved ejection fraction (HFpEF) (HCC)    a.) TTE 08/27/2022: EF >55%, aym LVH, mild LAE, triv AR/PR, mild MR/TR. mild AS, Ao root dilatation, G1DD   CKD (chronic kidney disease), stage III (HCC)    Gangrene of toe of right foot (HCC)    a.) RIGHT second toe   Heart murmur    History of bilateral cataract extraction 2020   HLD (hyperlipidemia)    Hypertension    Incomplete right bundle branch block (RBBB)    Long term current use of antithrombotics/antiplatelets    a.) clopidogrel   Neuropathy    Palpitations    T2DM (type 2 diabetes mellitus) (HCC)    Vertigo    Wears dentures    Full upper and lower (loose)    Procedure(s): AMPUTATION  TOE  Discharged Condition: good  HPI:  [name] is an 82 year old male now status postop day 5 from a right below the knee popliteal artery to peroneal artery bypass graft with reverse saphenous vein graft.  Patient is also postop day 1 from right second toe amputation.  Patient is resting and recovering as expected.  No current complaints prior to discharge.  Vitals all remained stable.  Patient to be discharged on ASA 81 mg daily and Plavix 75 mg daily and Crestor 10 mg Daily.   Hospital Course:  [name] is a 82 y.o. male is S/P Right POD #1 right second toe amputation. POD #5 from right below the knee popliteal artery to peroneal artery bypass graft surgery.  Extubated: POD # 0 Physical Exam:  Alert notes x3, no acute distress Face: Symmetrical.  Tongue is midline. Neck: Trachea is midline.  No swelling or bruising. Cardiovascular: Regular rate and rhythm Pulmonary: Clear to auscultation bilaterally Abdomen: Soft, nontender, nondistended Right groin access: Clean dry and intact.  No swelling or drainage noted Left lower extremity: Thigh soft.  Calf soft.  Extremities warm distally toes.  Hard to palpate pedal pulses however the foot is warm is her good capillary refill. Right lower extremity: Thigh soft.  Calf soft.  Extremities warm distally toes.  Hard to palpate pedal pulses however the foot is warm is her good capillary refill. Patient also POD #1 from toe amputation via Podiatry.  Neurological: No deficits noted   Post-op  wounds:  clean, dry, intact or healing well  Pt. Ambulating, voiding and taking PO diet without difficulty. Pt pain controlled with PO pain meds.  Labs:  As below  Complications: none  Consults:  Treatment Team:  Rosetta Posner, DPM Loyce Dys, MD  Significant Diagnostic Studies: CBC Lab Results  Component Value Date   WBC 12.8 (H) 04/12/2023   HGB 10.3 (L) 04/12/2023   HCT 31.0 (L) 04/12/2023   MCV 89.9 04/12/2023   PLT  170 04/12/2023    BMET    Component Value Date/Time   NA 136 04/12/2023 0017   K 4.9 04/12/2023 0017   CL 107 04/12/2023 0017   CO2 21 (L) 04/12/2023 0017   GLUCOSE 169 (H) 04/12/2023 0017   BUN 26 (H) 04/12/2023 0017   CREATININE 1.15 04/12/2023 0017   CALCIUM 8.5 (L) 04/12/2023 0017   GFRNONAA >60 04/12/2023 0017   GFRAA >60 07/30/2018 1045   COAG Lab Results  Component Value Date   INR 1.2 03/19/2023   INR 1.25 07/18/2018     Disposition:  Discharge to :Home  Allergies as of 04/17/2023       Reactions   Haemophilus B Polysaccharide Vaccine Nausea And Vomiting, Other (See Comments)   Fever, chills, vomiting.   Influenza Virus Vaccine Other (See Comments), Nausea And Vomiting   Fever, chills, vomiting.         Medication List     TAKE these medications    acetaminophen 325 MG tablet Commonly known as: TYLENOL Take 2 tablets (650 mg total) by mouth every 6 (six) hours as needed for mild pain or fever.   amoxicillin-clavulanate 500-125 MG tablet Commonly known as: Augmentin Take 1 tablet by mouth 3 (three) times daily.   aspirin EC 81 MG tablet Take 1 tablet (81 mg total) by mouth daily at 6 (six) AM. Swallow whole. Start taking on: April 18, 2023   clopidogrel 75 MG tablet Commonly known as: PLAVIX Take 75 mg by mouth daily.   docusate sodium 100 MG capsule Commonly known as: COLACE Take 1 capsule (100 mg total) by mouth daily. Start taking on: April 18, 2023   metoprolol tartrate 25 MG tablet Commonly known as: LOPRESSOR Take tablet TWO hours prior to his cardiac CT scan.   multivitamin tablet Take 1 tablet by mouth 3 (three) times a week.   oxyCODONE-acetaminophen 5-325 MG tablet Commonly known as: PERCOCET/ROXICET Take 1-2 tablets by mouth every 4 (four) hours as needed for moderate pain.   rosuvastatin 10 MG tablet Commonly known as: CRESTOR Take 1 tablet (10 mg total) by mouth daily. Start taking on: April 18, 2023   valsartan 80 MG  tablet Commonly known as: DIOVAN Take 1 tablet (80 mg total) by mouth daily.               Durable Medical Equipment  (From admission, onward)           Start     Ordered   04/15/23 1202  For home use only DME Walker rolling  Once       Question Answer Comment  Walker: With 5 Inch Wheels   Patient needs a walker to treat with the following condition Pain and swelling of lower leg      04/15/23 1201           Verbal and written Discharge instructions given to the patient. Wound care per Discharge AVS  Follow-up Information     Felecia Shelling, DPM. Schedule  an appointment as soon as possible for a visit in 1 week(s).   Specialty: Podiatry Contact information: [address] [place] [address] [phone]         Gilda Crease, Latina Craver, MD Follow up in 3 week(s).   Specialties: Vascular Surgery, Cardiology, Radiology, Vascular Surgery Why: Vilinda Flake or Sheppard Plumber, Staple removal. Right lower extremity duplex Ultrasound with ABI's Contact information: [address] [place] [address] [phone]                 Signed: Marcie Bal, NP  04/17/2023, 11:59 AM

## 2023-04-17 NOTE — Telephone Encounter (Signed)
Wife called to make a 7 day follow up after R toe amputation however the next availability in [place] isnt until 05/03/23 Pt wants to know if that's too late?   Please advise

## 2023-04-17 NOTE — Anesthesia Postprocedure Evaluation (Signed)
Anesthesia Post Note  Patient: [name]  Procedure(s) Performed: AMPUTATION TOE (Right: Toe)  Patient location during evaluation: PACU Anesthesia Type: General Level of consciousness: awake and alert Pain management: pain level controlled Vital Signs Assessment: post-procedure vital signs reviewed and stable Respiratory status: spontaneous breathing, nonlabored ventilation, respiratory function stable and patient connected to nasal cannula oxygen Cardiovascular status: blood pressure returned to baseline and stable Postop Assessment: no apparent nausea or vomiting Anesthetic complications: no   No notable events documented.   Last Vitals:  Vitals:   04/17/23 0409 04/17/23 0812  BP: (!) 128/59 137/68  Pulse: 62 74  Resp: 16 18  Temp: 36.8 C 37.1 C  SpO2: 97% 97%    Last Pain:  Vitals:   04/17/23 0820  TempSrc:   PainSc: 0-No pain                 Stephanie Coup

## 2023-04-17 NOTE — Discharge Instructions (Signed)
No heavy lift thing until returning to clinic.  Do not lift anything more than a gallon of milk.  Take all medications as prescribed.  Patient to be discharged on aspirin 81 mg daily, Plavix 75 mg daily, and  No driving until return to clinic.  Per podiatry weightbearing as tolerated in the surgical shoe which has been provided at the bedside.  Per podiatry no dressing changes until outpatient follow-up with Dr. Logan Bores in 1 week.  Patient to take antibiotics of Augmentin for the next 7 days as prescribed.  Patient may shower tomorrow when he gets home.  Let the soap and water run over incisions/staple lines but do not scrub.  Cover leg incisions with gauze and paper tape to keep protected dry and clean.  Follow-up as scheduled with both vein and vascular surgery and podiatry.  Outpatient physical therapy can be provided if the patient or patient caretaker calls [facility].  No consults for physical therapy needed. [phone]

## 2023-04-17 NOTE — Transfer of Care (Signed)
Immediate Anesthesia Transfer of Care Note  Patient: [name]  Procedure(s) Performed: AMPUTATION TOE (Right: Toe)  Patient Location: PACU  Anesthesia Type:General  Level of Consciousness: drowsy and patient cooperative  Airway & Oxygen Therapy: Patient Spontanous Breathing and Patient connected to nasal cannula oxygen  Post-op Assessment: Report given to RN and Post -op Vital signs reviewed and stable  Post vital signs: Reviewed and stable  Last Vitals:  Vitals Value Taken Time  BP 137/68 04/17/23 0812  Temp 37.1 C 04/17/23 0812  Pulse 74 04/17/23 0812  Resp 18 04/17/23 0812  SpO2 97 % 04/17/23 0812    Last Pain:  Vitals:   04/17/23 0820  TempSrc:   PainSc: 0-No pain      Patients Stated Pain Goal: 4 (04/12/23 1411)  Complications: No notable events documented.

## 2023-04-17 NOTE — Progress Notes (Addendum)
  Subjective:  Patient ID: [name], male    DOB: [birth year],  MRN: [id number]  POD #1 right second toe amputation  Having some pain had taken Percocet last night but seems to be improving  Negative for chest pain and shortness of breath Fever: no Constitutional signs: no Objective:   Vitals:   04/17/23 0409 04/17/23 0812  BP: (!) 128/59 137/68  Pulse: 62 74  Resp: 16 18  Temp: 98.2 F (36.8 C) 98.7 F (37.1 C)  SpO2: 97% 97%   General AA&O x3. Normal mood and affect.  Vascular Remaining digits right foot warm and well-perfused  Neurologic Epicritic sensation grossly intact.  Dermatologic Dressing is clean dry and intact  Orthopedic: MMT 5/5 in dorsiflexion, plantarflexion, inversion, and eversion. Normal joint ROM without pain or crepitus.    Assessment & Plan:  Patient was evaluated and treated and all questions answered.  POD #1 right Second toe amputation -WBAT in surgical shoe which is at bedside -No dressing changes until outpatient follow-up -Follow-up with Dr. Logan Bores in 1 week -Would Rx 7 days Augmentin at discharge  Edwin Cap, DPM  Accessible via secure chat for questions or concerns.

## 2023-04-17 NOTE — TOC Transition Note (Signed)
Transition of Care [facility]) - CM/SW Discharge Note   Patient Details  Name: [name] MRN: [id number] Date of Birth: September 11, 1941  Transition of Care [facility]) CM/SW Contact:  Margarito Liner, LCSW Phone Number: 04/17/2023, 12:34 PM   Clinical Narrative:  Patient has orders to discharge home today. Ordered RW through [employer]. No further concerns. CSW signing off.   Final next level of care: OP Rehab Barriers to Discharge: Barriers Resolved   Patient Goals and CMS Choice CMS Medicare.gov Compare Post Acute Care list provided to:: Patient Represenative (must comment) Choice offered to / list presented to : Spouse  Discharge Placement                      Patient and family notified of of transfer: 04/17/23  Discharge Plan and Services Additional resources added to the After Visit Summary for                  DME Arranged: Walker rolling DME Agency: [employer] Date DME Agency Contacted: 04/17/23   Representative spoke with at DME Agency: Yvone Neu HH Arranged: PT, OT [facility] Agency: [facility] Date [facility] Agency Contacted: 04/13/23   Representative spoke with at [facility] Agency: Kandee Keen  Social Determinants of Health (SDOH) Interventions SDOH Screenings   Food Insecurity: No Food Insecurity (03/20/2023)  Housing: Low Risk  (03/20/2023)  Transportation Needs: No Transportation Needs (03/20/2023)  Utilities: Not At Risk (03/20/2023)  Financial Resource Strain: Low Risk  (01/10/2023)   [facility]  Tobacco Use: Medium Risk (04/16/2023)     Readmission Risk Interventions    04/13/2023    1:27 PM  Readmission Risk Prevention Plan  Transportation Screening Complete  PCP or Specialist Appt within 5-7 Days Complete  Home Care Screening Complete  Medication Review (RN CM) Complete

## 2023-04-17 NOTE — Plan of Care (Signed)
  Problem: Skin Integrity: Goal: Demonstration of wound healing without infection will improve Outcome: Progressing   Problem: Activity: Goal: Risk for activity intolerance will decrease Outcome: Progressing   Problem: Safety: Goal: Ability to remain free from injury will improve Outcome: Progressing

## 2023-04-17 NOTE — Telephone Encounter (Signed)
That should be fine but if we can get him earlier lets try

## 2023-04-18 LAB — SURGICAL PATHOLOGY

## 2023-04-19 ENCOUNTER — Telehealth (INDEPENDENT_AMBULATORY_CARE_PROVIDER_SITE_OTHER): Payer: Self-pay

## 2023-04-19 ENCOUNTER — Other Ambulatory Visit (INDEPENDENT_AMBULATORY_CARE_PROVIDER_SITE_OTHER): Payer: Self-pay | Admitting: Nurse Practitioner

## 2023-04-19 ENCOUNTER — Telehealth: Payer: Self-pay | Admitting: Podiatry

## 2023-04-19 NOTE — Telephone Encounter (Signed)
Called pts wife and explained that pt was sent home with antibiotics and she said he was taking them. She said that his fever did go down to 100.8 after taking tylenol. I did explain to her if they could not get in to be seen by his pcp today that Dr Leonette Monarch on call) recommended pt go back to the hospital.  She understood verbally and is to call the pcp to see if they could see him today. I told her again that if he could not get in with the pcp today he should go to the hospital.

## 2023-04-19 NOTE — Telephone Encounter (Signed)
Called pt to get scheduled for next week ( hospital follow up) and pts wife states he has been running a fever since he came home. Its been 101 but today it was 102 and she gave him tylenol and it went down to 101 again. She is not sure what she needs to do. Please advise.  She has called his other doctors office and left message on the nurse line and has not heard from them yet.

## 2023-04-19 NOTE — Telephone Encounter (Signed)
Patient spouse notified with medical recommendations and verbalized understanding

## 2023-04-19 NOTE — Telephone Encounter (Signed)
Patient spouse left a message stating that her husband was release from the hospital on 04/17/23. Since being home he has been running a fever form 100.5,100.8,100.2. This morning his fever was 102.2 at 9 am and after taking two tylenol his fever went down to 100.8. Patient had bypass graft femoral-tibial on 04/11/23. Please Advise

## 2023-04-19 NOTE — Telephone Encounter (Signed)
It looks like they did send him home on antibitoics so make sure he is taking those. If he is unable to get in with his PCP then would suggest going back to the hosptial

## 2023-04-19 NOTE — Telephone Encounter (Signed)
The patient had several procedures last week.  The fevers are worrisome in that there could be some underlying infection in the foot area that wasn't cleared by the amputation. Although somewhat less likely there could be an infection in the bypass areas.  I note he's been on abx and those have not been helpful, so I think it would probably best for him to go to the ED for a full look and evaluation.

## 2023-04-22 ENCOUNTER — Telehealth (INDEPENDENT_AMBULATORY_CARE_PROVIDER_SITE_OTHER): Payer: Self-pay

## 2023-04-22 ENCOUNTER — Other Ambulatory Visit (INDEPENDENT_AMBULATORY_CARE_PROVIDER_SITE_OTHER): Payer: Self-pay | Admitting: Nurse Practitioner

## 2023-04-22 DIAGNOSIS — I7025 Atherosclerosis of native arteries of other extremities with ulceration: Secondary | ICD-10-CM

## 2023-04-22 NOTE — Telephone Encounter (Signed)
Bonita Quin called about the patient having surgery on 04/11/23. She stated that PT was mention at discharged by nothing was mentioned in his paper work and they haven't received a call about PT. She would like to know is there someone she needs to call to get that set up and schedule.   Please advice

## 2023-04-22 NOTE — Telephone Encounter (Signed)
Referral sent 

## 2023-04-22 NOTE — Telephone Encounter (Signed)
She's okay with getting the referral. She's already called and got everything set up with [facility]. They are just waiting on a referral if you can send it there.

## 2023-04-22 NOTE — Telephone Encounter (Signed)
Based on the notes that I have, there was a discussion of outpatient PT.  If she is agreeable, I can place orders

## 2023-04-23 ENCOUNTER — Ambulatory Visit: Payer: Medicare Other | Admitting: Podiatry

## 2023-04-23 DIAGNOSIS — I96 Gangrene, not elsewhere classified: Secondary | ICD-10-CM

## 2023-04-23 NOTE — Progress Notes (Signed)
No chief complaint on file.   Subjective:  Patient presents today status post right second toe amputation performed inpatient on 04/16/2023.  Patient doing well.  WBAT surgical shoe.  Dressings are intact.  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   a.) TTE 08/27/2022: Ao sinus 4.1, asc Ao 3.7, ST junction 3.3   Atherosclerotic PVD with intermittent claudication (HCC)    a.) s/p vascular intervention 03/26/2018:  post tibial and peroneal crosser athrectomy, PTA LEFT post tibial, SFA, and popliteal arteries; b.) s/p PTA 03/20/2023: RIGHT SFA and popliteal arteries   Bilateral carotid artery disease (HCC) 08/22/2022   a.) carotid doppler 08/22/2022: <50 LICA, 50-69% RICA   Blurry vision, bilateral    CAD (coronary artery disease) 09/21/2022   a.) cCTA 09/21/2022: 591 (57 %ile for age/sex/race matched control)   Chest pain    Chronic heart failure with preserved ejection fraction (HFpEF) (HCC)    a.) TTE 08/27/2022: EF >55%, aym LVH, mild LAE, triv AR/PR, mild MR/TR. mild AS, Ao root dilatation, G1DD   CKD (chronic kidney disease), stage III (HCC)    Gangrene of toe of right foot (HCC)    a.) RIGHT second toe   Heart murmur    History of bilateral cataract extraction 2020   HLD (hyperlipidemia)    Hypertension    Incomplete right bundle branch block (RBBB)    Long term current use of antithrombotics/antiplatelets    a.) clopidogrel   Neuropathy    Palpitations    T2DM (type 2 diabetes mellitus) (HCC)    Vertigo    Wears dentures    Full upper and lower (loose)    Past Surgical History:  Procedure Laterality Date   AMPUTATION TOE Left 03/28/2018   Procedure: AMPUTATION TOE/ PARTIAL RAY RESECTION-LEFT 2ND AND 3RD;  Surgeon: Linus Galas, DPM;  Location: [facility] OR[facility];  Service: Podiatry;  Laterality: Left;   AMPUTATION TOE Right 04/16/2023   Procedure:  AMPUTATION TOE;  Surgeon: Felecia Shelling, DPM;  Location: [facility] OR[facility];  Service: Orthopedics/Podiatry;  Laterality: Right;   CATARACT EXTRACTION W/PHACO Left 02/10/2019   Procedure: CATARACT EXTRACTION PHACO AND INTRAOCULAR LENS PLACEMENT (IOC)  LEFT DIABETIC;  Surgeon: Nevada Crane, MD;  Location: [facility];  Service: Ophthalmology;  Laterality: Left;  Diabetic - oral meds   CATARACT EXTRACTION W/PHACO Right 04/20/2019   Procedure: CATARACT EXTRACTION PHACO AND INTRAOCULAR LENS PLACEMENT (IOC)  RIGHT DIABETIC;  Surgeon: Nevada Crane, MD;  Location: [facility];  Service: Ophthalmology;  Laterality: Right;   FEMORAL-TIBIAL BYPASS GRAFT Right 04/11/2023   Procedure: BYPASS GRAFT FEMORAL-TIBIAL ARTERY ( POPLITEAL TO PERONEAL);  Surgeon: Renford Dills, MD;  Location: [facility] OR[facility];  Service: Vascular;  Laterality: Right;   HERNIA REPAIR     LOWER EXTREMITY ANGIOGRAPHY Left 03/26/2018   Procedure: Lower Extremity Angiography;  Surgeon: Renford Dills, MD;  Location: [facility];  Service: Cardiovascular;  Laterality: Left;   LOWER EXTREMITY ANGIOGRAPHY Right 03/20/2023   Procedure: Lower Extremity Angiography;  Surgeon: Renford Dills, MD;  Location: [facility];  Service: Cardiovascular;  Laterality: Right;   Percutaneous transluminal angioplasty right superficial femoral artery      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   Procedure: TRANSMETATARSAL AMPUTATION;  Surgeon: Recardo Evangelist, DPM;  Location: [facility];  Service: Podiatry;  Laterality: Left;    Allergies  Allergen  Reactions   Haemophilus B Polysaccharide Vaccine Nausea And Vomiting and Other (See Comments)    Fever, chills, vomiting.   Influenza Virus Vaccine Other (See Comments) and Nausea And Vomiting    Fever, chills, vomiting.     Objective/Physical Exam Neurovascular status intact.  Sutures intact.  There is some maceration at the amputation site but overall it is stable.   Clinically there is no purulence, malodor, erythema or concern for infection  Assessment: 1. s/p right second toe amputation. DOS: 04/16/2023.  [facility] inpatient   Plan of Care:  -Patient was evaluated.  -Dressings changed.  Leave clean dry and intact until next visit -Continue WBAT surgical shoe -Continue oral Augmentin until completed.  Patient's spouse states that there are approximately 8 days left of the Augmentin -Return to clinic next scheduled appointment on 05/03/2023  Felecia Shelling, DPM [facility]  Dr. Felecia Shelling, DPM    [address] [place], [place] [address]                Office [phone]  Fax [phone]

## 2023-04-24 ENCOUNTER — Telehealth (INDEPENDENT_AMBULATORY_CARE_PROVIDER_SITE_OTHER): Payer: Self-pay

## 2023-04-24 ENCOUNTER — Ambulatory Visit: Payer: Medicare Other | Admitting: Podiatry

## 2023-04-24 NOTE — Telephone Encounter (Signed)
Patient spouse left a message informing that she spoke with physical therapy that we referred out for her husband and at this time they were not accepting patients. She did state that her husband is refusing physical therapy. I advise her to call back if he changes his mind so we place new referral out.

## 2023-04-25 ENCOUNTER — Other Ambulatory Visit (INDEPENDENT_AMBULATORY_CARE_PROVIDER_SITE_OTHER): Payer: Self-pay | Admitting: Nurse Practitioner

## 2023-04-25 DIAGNOSIS — I7025 Atherosclerosis of native arteries of other extremities with ulceration: Secondary | ICD-10-CM

## 2023-04-25 NOTE — Telephone Encounter (Signed)
Referral placed to [facility]

## 2023-04-25 NOTE — Telephone Encounter (Signed)
Spoke with Synetta Fail and she states understanding of the PT referral being sent.

## 2023-04-29 ENCOUNTER — Ambulatory Visit: Payer: Medicare Other | Attending: Nurse Practitioner

## 2023-04-29 DIAGNOSIS — R262 Difficulty in walking, not elsewhere classified: Secondary | ICD-10-CM | POA: Insufficient documentation

## 2023-04-29 DIAGNOSIS — I7025 Atherosclerosis of native arteries of other extremities with ulceration: Secondary | ICD-10-CM | POA: Insufficient documentation

## 2023-04-29 DIAGNOSIS — M6281 Muscle weakness (generalized): Secondary | ICD-10-CM | POA: Diagnosis present

## 2023-04-29 DIAGNOSIS — R2681 Unsteadiness on feet: Secondary | ICD-10-CM | POA: Diagnosis present

## 2023-04-29 NOTE — Therapy (Signed)
[facility] Outpatient Rehabilitation at [facility] [address] [place], [place], [address] Phone: [phone]   Fax:  [phone]  Outpaient Physical Therapy Evaluation  Patient Details  Name: [name]"  MRN: [id number] Date of Birth: 07-02-1941 Referring Provider (PT): Sheppard Plumber, NP (Vascular Surgery)   Encounter Date: 04/29/2023   PT End of Session - 04/29/23 1259     Visit Number 1    Number of Visits 16    Date for PT Re-Evaluation 07/22/23    Authorization Type [employer] Medicare    Authorization Time Period 04/29/23-07/22/23    Progress Note Due on Visit 10    PT Start Time 1145    PT Stop Time 1225    PT Time Calculation (min) 40 min    Activity Tolerance Patient tolerated treatment well;No increased pain    Behavior During Therapy [facility] for tasks assessed/performed             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   a.) TTE 08/27/2022: Ao sinus 4.1, asc Ao 3.7, ST junction 3.3   Atherosclerotic PVD with intermittent claudication (HCC)    a.) s/p vascular intervention 03/26/2018:  post tibial and peroneal crosser athrectomy, PTA LEFT post tibial, SFA, and popliteal arteries; b.) s/p PTA 03/20/2023: RIGHT SFA and popliteal arteries   Bilateral carotid artery disease (HCC) 08/22/2022   a.) carotid doppler 08/22/2022: <50 LICA, 50-69% RICA   Blurry vision, bilateral    CAD (coronary artery disease) 09/21/2022   a.) cCTA 09/21/2022: 591 (57 %ile for age/sex/race matched control)   Chest pain    Chronic heart failure with preserved ejection fraction (HFpEF) (HCC)    a.) TTE 08/27/2022: EF >55%, aym LVH, mild LAE, triv AR/PR, mild MR/TR. mild AS, Ao root dilatation, G1DD   CKD (chronic kidney disease), stage III (HCC)    Gangrene of toe of right foot (HCC)    a.) RIGHT second toe   Heart murmur    History  of bilateral cataract extraction 2020   HLD (hyperlipidemia)    Hypertension    Incomplete right bundle branch block (RBBB)    Long term current use of antithrombotics/antiplatelets    a.) clopidogrel   Neuropathy    Palpitations    T2DM (type 2 diabetes mellitus) (HCC)    Vertigo    Wears dentures    Full upper and lower (loose)    Past Surgical History:  Procedure Laterality Date   AMPUTATION TOE Left 03/28/2018   Procedure: AMPUTATION TOE/ PARTIAL RAY RESECTION-LEFT 2ND AND 3RD;  Surgeon: Linus Galas, DPM;  Location: [facility];  Service: Podiatry;  Laterality: Left;   AMPUTATION TOE Right 04/16/2023   Procedure: AMPUTATION TOE;  Surgeon: Felecia Shelling, DPM;  Location: [facility];  Service: Orthopedics/Podiatry;  Laterality: Right;   CATARACT EXTRACTION W/PHACO Left 02/10/2019   Procedure: CATARACT EXTRACTION PHACO AND INTRAOCULAR LENS PLACEMENT (IOC)  LEFT DIABETIC;  Surgeon: Nevada Crane, MD;  Location: [facility];  Service: Ophthalmology;  Laterality: Left;  Diabetic - oral meds   CATARACT EXTRACTION W/PHACO Right 04/20/2019   Procedure: CATARACT EXTRACTION PHACO AND INTRAOCULAR LENS PLACEMENT (IOC)  RIGHT DIABETIC;  Surgeon: Nevada Crane, MD;  Location: [facility];  Service: Ophthalmology;  Laterality: Right;   FEMORAL-TIBIAL BYPASS GRAFT Right 04/11/2023   Procedure: BYPASS GRAFT FEMORAL-TIBIAL ARTERY (  POPLITEAL TO PERONEAL);  Surgeon: Renford Dills, MD;  Location: [facility];  Service: Vascular;  Laterality: Right;   HERNIA REPAIR     LOWER EXTREMITY ANGIOGRAPHY Left 03/26/2018   Procedure: Lower Extremity Angiography;  Surgeon: Renford Dills, MD;  Location: [facility];  Service: Cardiovascular;  Laterality: Left;   LOWER EXTREMITY ANGIOGRAPHY Right 03/20/2023   Procedure: Lower Extremity Angiography;  Surgeon: Renford Dills, MD;  Location: [facility];  Service: Cardiovascular;  Laterality: Right;   Percutaneous  transluminal angioplasty right superficial femoral artery      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   Procedure: TRANSMETATARSAL AMPUTATION;  Surgeon: Recardo Evangelist, DPM;  Location: [facility];  Service: Podiatry;  Laterality: Left;    There were no vitals filed for this visit.    Subjective Assessment -     Subjective Pt reports he needs to get strength back.    Pertinent History Celso Colocho is an 82yoM who is referred to OPPT for balance issues in the setting of recent hopsital admission, Rt PopPer bypass RLE 04/11/23 and Rt 2nd toe amputation 04/16/23. Pt was DC to home with RW (unable to use due to space) and surgical shoe (wrong shoe but podiatry says 'probably fine') WBAT. Pt is using SPC for AMB out of house, furniture for in-home AMB, has not returned to AMB over soft surfaces (feeding his chickens). Pt has not had any falls in recent months, however has remote falls. Pt formerly limited also by visual impairments 2/2 cataracts.    Currently in Pain? No/denies                [facility] PT Assessment -       Assessment   Medical Diagnosis Weakness, unsteadiness    Referring Provider (PT) Sheppard Plumber, NP   Vascular Surgery   Onset Date/Surgical Date --   PopPer bypass RLE 04/11/23; Rt 2nd toe amputation 04/16/23   Prior Therapy none      Precautions   Precautions --   postop shoe Right   Precaution Comments was supposed to have a heel weight shoe, but podiatrist said current shoe should suffice.      Restrictions   Weight Bearing Restrictions Yes    RLE Weight Bearing Weight bearing as tolerated      Balance Screen   Has the patient fallen in the past 6 months No    Has the patient had a decrease in activity level because of a fear of falling?  No    Is the patient reluctant to leave their home because of a fear of falling?  No      Home Environment   Living Environment Private residence    Living Arrangements Spouse/significant other    Type of Home Mobile home     Home Access Ramped entrance    Home Layout One level    Home Equipment Walker - 2 wheels;Cane - single point   swiffer pole   Additional Comments Son DIL, wife assist for transportation      Prior Function   Level of Independence Independent with basic ADLs;Independent with household mobility with device    Vocation Retired      Observation/Other Assessments   Focus on Therapeutic Outcomes (FOTO)  62      Transfers   Five time sit to stand comments  25.66   hands free     Ambulation/Gait   Ambulation Distance (Feet) 150 Feet   SPC RUE, postop shoe Rt  Gait velocity 0.21m/s      Standardized Balance Assessment   Standardized Balance Assessment Berg Balance Test      Berg Balance Test   Sit to Stand Able to stand without using hands and stabilize independently    Standing Unsupported Able to stand safely 2 minutes    Sitting with Back Unsupported but Feet Supported on Floor or Stool Able to sit safely and securely 2 minutes    Stand to Sit Sits safely with minimal use of hands    Transfers Able to transfer safely, minor use of hands    Standing Unsupported with Eyes Closed Able to stand 10 seconds safely    Standing Unsupported with Feet Together Able to place feet together independently and stand for 1 minute with supervision    From Standing, Reach Forward with Outstretched Arm Can reach confidently >25 cm (10")    From Standing Position, Pick up Object from Floor Able to pick up shoe safely and easily    From Standing Position, Turn to Look Behind Over each Shoulder Turn sideways only but maintains balance    Turn 360 Degrees Able to turn 360 degrees safely but slowly    Standing Unsupported, Alternately Place Feet on Step/Stool Needs assistance to keep from falling or unable to try    Standing Unsupported, One Foot in Front Able to take small step independently and hold 30 seconds    Standing on One Leg Unable to try or needs assist to prevent fall    Total Score 41                    Objective measurements completed on examination: See above findings.        PT Education -     Education Details Need to limit activity on RLE while heeling continues given lack of correct shoe and lack of use of RW since DC.    Person(s) Educated Patient    Methods Explanation;Demonstration    Comprehension Verbalized understanding;Returned demonstration;Need further instruction              PT Short Term Goals -       PT SHORT TERM GOAL #1   Title Pt to report compliance and safe setup for started balance HEP.    Baseline eval: no HEP    Time 2    Period Weeks    Status New    Target Date 05/13/23      PT SHORT TERM GOAL #2   Title Pt to improve balance performance AEB BBT score improvement >8 points.    Baseline eval: BBT: 41    Time 4    Period Weeks    Status New    Target Date 05/27/23               PT Long Term Goals -       PT LONG TERM GOAL #1   Title Pt to improve balance performance AEB improved BBT >12 points from initital assessment:    Baseline eval: 41    Time 8    Period Weeks    Status New    Target Date 06/24/23      PT LONG TERM GOAL #2   Title Pt to improve self-reported confidence in balance AEB FOTO: score improvement >12 points    Baseline eval: 62    Time 8    Period Weeks    Status New    Target Date 06/24/23  PT LONG TERM GOAL #3   Title Pt to report successful return to feeding chickens in a context thought to be safe by both patient and by family.    Baseline eval: has not done this since hospitalization    Time 12    Period Weeks    Status New    Target Date 07/22/23      PT LONG TERM GOAL #4   Title Pt to improve self selected short distance gait speed to >0.46m/s to facilitate energy efficiency in limited community distance AMB.    Baseline eval : 0.47 m/s    Time 12    Period Weeks    Status New    Target Date 07/22/23      PT LONG TERM GOAL #5   Title Pt to demonstrate  ability to perform sustained AMB >1563ft without stopping due to fatigue and without LOB to prepare for return to yardwork activity.    Time 12    Period Weeks    Status New    Target Date 07/22/23                 Plan -     Clinical Impression Statement Chanetta Marshall presents less than 2 weeks from vascular bypass and toe amputation. Pt has been safe with household mobility since DC, using devices intermittently, following precautions and restrictions post surgery for the most part. Pt has not returned to normal footwear yet. Pt has not returned to household actiivties outside as of yet. A skilled PT intervention can help address deficits identified today in the areas of balance, gait speed, activity tolerance, and strength.    Personal Factors and Comorbidities Age;Time since onset of injury/illness/exacerbation    Examination-Activity Limitations Locomotion Level;Stairs;Transfers    Examination-Participation Restrictions Yard Work    Stability/Clinical Decision Making Evolving/Moderate complexity    Clinical Decision Making Moderate    Rehab Potential Good    PT Frequency 2x / week    PT Duration 12 weeks    PT Treatment/Interventions ADLs/Self Care Home Management;Gait training;DME Instruction;Stair training;Functional mobility training;Therapeutic activities;Therapeutic exercise;Balance training;Neuromuscular re-education;Patient/family education    PT Next Visit Plan find an HEP that pt can perform safely and independently at home based off BBT results 8/12;    PT Home Exercise Plan pending visit 2    Consulted and Agree with Plan of Care Patient;Family member/caregiver    Family Member Consulted DIL             Patient will benefit from skilled therapeutic intervention in order to improve the following deficits and impairments:  Abnormal gait, Decreased activity tolerance, Decreased balance, Decreased mobility, Decreased endurance, Decreased knowledge of use of DME, Decreased  knowledge of precautions  Visit Diagnosis: Unsteadiness on feet  Difficulty in walking, not elsewhere classified     Problem List Patient Active Problem List   Diagnosis Date Noted   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) 04/11/2023   Chronic kidney disease, stage 3a (HCC) 03/19/2023   Chronic diastolic CHF (congestive heart failure) (HCC) 03/19/2023   PVD (peripheral vascular disease) (HCC) 03/19/2023   CAD (coronary artery disease) 03/19/2023   Gangrene of toe of right foot (HCC) 03/19/2023   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   Atherosclerosis of native arteries of the extremities with ulceration (HCC) 04/28/2018   Diabetes (HCC) 04/28/2018   Essential hypertension 04/28/2018   Diabetic foot infection (HCC) 03/24/2018     3:47 PM, 04/29/23 Rosamaria Lints, PT, DPT Physical Therapist - [facility]  Outpatient Physical Therapy- [facility] [phone]      [place], [place] 04/29/2023, 3:45 PM  [facility] Outpatient Rehabilitation at [facility] [address] [place], [place], [address] Phone: [phone]   Fax:  [phone]  Name: WAITE MISCHLER MRN: [id number] Date of Birth: Aug 31, 1941

## 2023-05-01 NOTE — Therapy (Signed)
[facility] Outpatient Rehabilitation at [facility] [address] 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   a.) TTE 08/27/2022: Ao sinus 4.1, asc Ao 3.7, ST junction 3.3   Atherosclerotic PVD with intermittent claudication (HCC)    a.) s/p vascular intervention 03/26/2018:  post tibial and peroneal crosser athrectomy, PTA LEFT post tibial, SFA, and popliteal arteries; b.) s/p PTA 03/20/2023: RIGHT SFA and popliteal arteries   Bilateral carotid artery disease (HCC) 08/22/2022   a.) carotid doppler 08/22/2022: <50 LICA, 50-69% RICA   Blurry vision, bilateral    CAD (coronary artery disease) 09/21/2022   a.) cCTA 09/21/2022: 591 (57 %ile for age/sex/race matched control)   Chest pain    Chronic heart failure with preserved ejection fraction (HFpEF) (HCC)    a.) TTE 08/27/2022: EF >55%, aym LVH, mild LAE, triv AR/PR, mild MR/TR. mild AS, Ao root dilatation, G1DD   CKD (chronic kidney disease), stage III (HCC)    Gangrene of toe of right foot (HCC)    a.) RIGHT second toe   Heart murmur    History of bilateral cataract extraction 2020   HLD (hyperlipidemia)    Hypertension    Incomplete right bundle branch block (RBBB)    Long term current use of antithrombotics/antiplatelets    a.) clopidogrel   Neuropathy    Palpitations    T2DM (type 2 diabetes mellitus) (HCC)    Vertigo    Wears dentures    Full upper and lower (loose)    Past Surgical History:  Procedure Laterality Date   AMPUTATION TOE Left 03/28/2018   Procedure: AMPUTATION TOE/  PARTIAL RAY RESECTION-LEFT 2ND AND 3RD;  Surgeon: Linus Galas, DPM;  Location: [facility] OR[facility];  Service: Podiatry;  Laterality: Left;   AMPUTATION TOE Right 04/16/2023   Procedure: AMPUTATION TOE;  Surgeon: Felecia Shelling, DPM;  Location: [facility];  Service: Orthopedics/Podiatry;  Laterality: Right;   CATARACT EXTRACTION W/PHACO Left 02/10/2019   Procedure: CATARACT EXTRACTION PHACO AND INTRAOCULAR LENS PLACEMENT (IOC)  LEFT DIABETIC;  Surgeon: Nevada Crane, MD;  Location: [facility];  Service: Ophthalmology;  Laterality: Left;  Diabetic - oral meds   CATARACT EXTRACTION W/PHACO Right 04/20/2019   Procedure: CATARACT EXTRACTION PHACO AND INTRAOCULAR LENS PLACEMENT (IOC)  RIGHT DIABETIC;  Surgeon: Nevada Crane, MD;  Location: [facility];  Service: Ophthalmology;  Laterality: Right;   FEMORAL-TIBIAL BYPASS GRAFT Right 04/11/2023   Procedure: BYPASS GRAFT FEMORAL-TIBIAL ARTERY ( POPLITEAL TO PERONEAL);  Surgeon: Renford Dills, MD;  Location: [facility] OR[facility];  Service: Vascular;  Laterality: Right;   HERNIA REPAIR     LOWER EXTREMITY ANGIOGRAPHY Left 03/26/2018   Procedure: Lower Extremity Angiography;  Surgeon: Renford Dills, MD;  Location: [facility] INVASIVE [facility];  Service: Cardiovascular;  Laterality: Left;   LOWER EXTREMITY ANGIOGRAPHY Right 03/20/2023   Procedure: Lower Extremity Angiography;  Surgeon: Renford Dills, MD;  Location: [facility] INVASIVE CV [facility];  Service: Cardiovascular;  Laterality: Right;   Percutaneous transluminal angioplasty right superficial femoral artery      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    Subjective Assessment -     Subjective *** Pt reports he needs to get strength back.    Pertinent History [name] is an 82yoM who is referred to OPPT for balance  issues in the setting of recent hopsital admission, Rt PopPer bypass RLE 04/11/23 and Rt 2nd toe amputation 04/16/23. Pt was DC to home with RW (unable to use due to space) and surgical shoe (wrong shoe but podiatry says 'probably fine') WBAT. Pt is using SPC for AMB out of house, furniture for in-home AMB, has not returned to AMB over soft surfaces (feeding his chickens). Pt has not had any falls in recent months, however has remote falls. Pt formerly limited also by visual impairments 2/2 cataracts.    Currently in Pain? No/denies                [facility] PT Assessment -       Assessment   Medical Diagnosis Weakness, unsteadiness    Referring Provider (PT) Sheppard Plumber, NP   Vascular Surgery   Onset Date/Surgical Date --   PopPer bypass RLE 04/11/23; Rt 2nd toe amputation 04/16/23   Prior Therapy none      Precautions   Precautions --   postop shoe Right   Precaution Comments was supposed to have a heel weight shoe, but podiatrist said current shoe should suffice.      Restrictions   Weight Bearing Restrictions Yes    RLE Weight Bearing Weight bearing as tolerated      Balance Screen   Has the patient fallen in the past 6 months No    Has the patient had a decrease in activity level because of a fear of falling?  No    Is the patient reluctant to leave their home because of a fear of falling?  No      Home Environment   Living Environment Private residence    Living Arrangements Spouse/significant other    Type of Home Mobile home    Home Access Ramped entrance    Home Layout One level    Home Equipment  - 2 wheels;Cane - single point   swiffer pole   Additional Comments Son DIL, wife assist for transportation      Prior Function   Level of Independence Independent with basic ADLs;Independent with household mobility with device    Vocation Retired      Observation/Other Assessments   Focus on Therapeutic Outcomes (FOTO)  62      Transfers   Five time sit to stand  comments  25.66   hands free     Ambulation/Gait   Ambulation Distance (Feet) 150 Feet   SPC RUE, postop shoe Rt   Gait velocity 0.71m/s      Standardized Balance Assessment   Standardized Balance Assessment Berg Balance Test      Berg Balance Test   Sit to Stand Able to stand without using hands and stabilize independently    Standing Unsupported Able to stand safely 2 minutes    Sitting with Back Unsupported but Feet Supported on Floor or Stool Able to sit safely and securely 2 minutes    Stand to Sit Sits safely with minimal use of hands    Transfers Able to transfer safely, minor use of hands    Standing Unsupported  with Eyes Closed Able to stand 10 seconds safely    Standing Unsupported with Feet Together Able to place feet together independently and stand for 1 minute with supervision    From Standing, Reach Forward with Outstretched Arm Can reach confidently >25 cm (10")    From Standing Position, Pick up Object from Floor Able to pick up shoe safely and easily    From Standing Position, Turn to Look Behind Over each Shoulder Turn sideways only but maintains balance    Turn 360 Degrees Able to turn 360 degrees safely but slowly    Standing Unsupported, Alternately Place Feet on Step/Stool Needs assistance to keep from falling or unable to try    Standing Unsupported, One Foot in Front Able to take small step independently and hold 30 seconds    Standing on One Leg Unable to try or needs assist to prevent fall    Total Score 41             TREATMENT 05/01/23:  ***      Objective measurements completed on examination: See above findings.        PT Education -     Education Details Need to limit activity on RLE while heeling continues given lack of correct shoe and lack of use of RW since DC.    Person(s) Educated Patient    Methods Explanation;Demonstration    Comprehension Verbalized understanding;Returned demonstration;Need further instruction               PT Short Term Goals -       PT SHORT TERM GOAL #1   Title Pt to report compliance and safe setup for started balance HEP.    Baseline eval: no HEP    Time 2    Period Weeks    Status New    Target Date 05/13/23      PT SHORT TERM GOAL #2   Title Pt to improve balance performance AEB BBT score improvement >8 points.    Baseline eval: BBT: 41    Time 4    Period Weeks    Status New    Target Date 05/27/23               PT Long Term Goals -       PT LONG TERM GOAL #1   Title Pt to improve balance performance AEB improved BBT >12 points from initital assessment:    Baseline eval: 41    Time 8    Period Weeks    Status New    Target Date 06/24/23      PT LONG TERM GOAL #2   Title Pt to improve self-reported confidence in balance AEB FOTO: score improvement >12 points    Baseline eval: 62    Time 8    Period Weeks    Status New    Target Date 06/24/23      PT LONG TERM GOAL #3   Title Pt to report successful return to feeding chickens in a context thought to be safe by both patient and by family.    Baseline eval: has not done this since hospitalization    Time 12    Period Weeks    Status New    Target Date 07/22/23      PT LONG TERM GOAL #4   Title Pt to improve self selected short distance gait speed to >0.75m/s to facilitate energy efficiency in limited community distance AMB.    Baseline eval  : 0.47 m/s    Time 12    Period Weeks    Status New    Target Date 07/22/23      PT LONG TERM GOAL #5   Title Pt to demonstrate ability to perform sustained AMB >1573ft without stopping due to fatigue and without LOB to prepare for return to yardwork activity.    Time 12    Period Weeks    Status New    Target Date 07/22/23                 Plan -     Clinical Impression Statement *** [name] presents less than 2 weeks from vascular bypass and toe amputation. Pt has been safe with household mobility since DC, using devices intermittently,  following precautions and restrictions post surgery for the most part. Pt has not returned to normal footwear yet. Pt has not returned to household actiivties outside as of yet. A skilled PT intervention can help address deficits identified today in the areas of balance, gait speed, activity tolerance, and strength.    Personal Factors and Comorbidities Age;Time since onset of injury/illness/exacerbation    Examination-Activity Limitations Locomotion Level;Stairs;Transfers    Examination-Participation Restrictions Yard Work    Stability/Clinical Decision Making Evolving/Moderate complexity    Clinical Decision Making Moderate    Rehab Potential Good    PT Frequency 2x / week    PT Duration 12 weeks    PT Treatment/Interventions ADLs/Self Care Home Management;Gait training;DME Instruction;Stair training;Functional mobility training;Therapeutic activities;Therapeutic exercise;Balance training;Neuromuscular re-education;Patient/family education    PT Next Visit Plan find an HEP that pt can perform safely and independently at home based off BBT results 8/12;    PT Home Exercise Plan pending visit 2    Consulted and Agree with Plan of Care Patient;Family member/caregiver    Family Member Consulted DIL             Patient will benefit from skilled therapeutic intervention in order to improve the following deficits and impairments:     Visit Diagnosis: Unsteadiness on feet  Difficulty in walking, not elsewhere classified     Problem List Patient Active Problem List   Diagnosis Date Noted   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) 04/11/2023   Chronic kidney disease, stage 3a (HCC) 03/19/2023   Chronic diastolic CHF (congestive heart failure) (HCC) 03/19/2023   PVD (peripheral vascular disease) (HCC) 03/19/2023   CAD (coronary artery disease) 03/19/2023   Gangrene of toe of right foot (HCC) 03/19/2023   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   Atherosclerosis of native  arteries of the extremities with ulceration (HCC) 04/28/2018   Diabetes (HCC) 04/28/2018   Essential hypertension 04/28/2018   Diabetic foot infection (HCC) 03/24/2018     3:41 PM, 05/01/23 Maylon Peppers, PT, DPT Physical Therapist - [facility]  Outpatient Physical Therapy- Main Campus [phone]      [facility] Outpatient Rehabilitation at [facility] [address] [place], [place], [address] Phone: [phone]   Fax:  [phone]  Name: [name] MRN: [id number] Date of Birth: 1941-08-26

## 2023-05-02 ENCOUNTER — Other Ambulatory Visit: Payer: Self-pay

## 2023-05-02 ENCOUNTER — Ambulatory Visit: Payer: Medicare Other

## 2023-05-02 ENCOUNTER — Emergency Department: Payer: Medicare Other

## 2023-05-02 ENCOUNTER — Inpatient Hospital Stay
Admission: EM | Admit: 2023-05-02 | Discharge: 2023-05-05 | DRG: 920 | Disposition: A | Discharge status: Home / Self Care | Payer: Medicare Other | Attending: Obstetrics and Gynecology | Admitting: Obstetrics and Gynecology

## 2023-05-02 DIAGNOSIS — E1142 Type 2 diabetes mellitus with diabetic polyneuropathy: Secondary | ICD-10-CM | POA: Diagnosis present

## 2023-05-02 DIAGNOSIS — R262 Difficulty in walking, not elsewhere classified: Secondary | ICD-10-CM

## 2023-05-02 DIAGNOSIS — Z7902 Long term (current) use of antithrombotics/antiplatelets: Secondary | ICD-10-CM

## 2023-05-02 DIAGNOSIS — E1122 Type 2 diabetes mellitus with diabetic chronic kidney disease: Secondary | ICD-10-CM | POA: Diagnosis present

## 2023-05-02 DIAGNOSIS — Z887 Allergy status to serum and vaccine status: Secondary | ICD-10-CM

## 2023-05-02 DIAGNOSIS — L0390 Cellulitis, unspecified: Principal | ICD-10-CM

## 2023-05-02 DIAGNOSIS — E1151 Type 2 diabetes mellitus with diabetic peripheral angiopathy without gangrene: Secondary | ICD-10-CM | POA: Diagnosis present

## 2023-05-02 DIAGNOSIS — L7634 Postprocedural seroma of skin and subcutaneous tissue following other procedure: Principal | ICD-10-CM | POA: Diagnosis present

## 2023-05-02 DIAGNOSIS — Z833 Family history of diabetes mellitus: Secondary | ICD-10-CM

## 2023-05-02 DIAGNOSIS — Z8249 Family history of ischemic heart disease and other diseases of the circulatory system: Secondary | ICD-10-CM

## 2023-05-02 DIAGNOSIS — Y835 Amputation of limb(s) as the cause of abnormal reaction of the patient, or of later complication, without mention of misadventure at the time of the procedure: Secondary | ICD-10-CM | POA: Diagnosis present

## 2023-05-02 DIAGNOSIS — I10 Essential (primary) hypertension: Secondary | ICD-10-CM | POA: Diagnosis present

## 2023-05-02 DIAGNOSIS — E119 Type 2 diabetes mellitus without complications: Secondary | ICD-10-CM

## 2023-05-02 DIAGNOSIS — L03314 Cellulitis of groin: Secondary | ICD-10-CM | POA: Diagnosis present

## 2023-05-02 DIAGNOSIS — Z87891 Personal history of nicotine dependence: Secondary | ICD-10-CM

## 2023-05-02 DIAGNOSIS — I350 Nonrheumatic aortic (valve) stenosis: Secondary | ICD-10-CM | POA: Diagnosis present

## 2023-05-02 DIAGNOSIS — D6489 Other specified anemias: Secondary | ICD-10-CM | POA: Diagnosis present

## 2023-05-02 DIAGNOSIS — I5032 Chronic diastolic (congestive) heart failure: Secondary | ICD-10-CM | POA: Diagnosis present

## 2023-05-02 DIAGNOSIS — Z79899 Other long term (current) drug therapy: Secondary | ICD-10-CM

## 2023-05-02 DIAGNOSIS — Z89422 Acquired absence of other left toe(s): Secondary | ICD-10-CM

## 2023-05-02 DIAGNOSIS — I739 Peripheral vascular disease, unspecified: Secondary | ICD-10-CM | POA: Diagnosis present

## 2023-05-02 DIAGNOSIS — I4510 Unspecified right bundle-branch block: Secondary | ICD-10-CM | POA: Diagnosis present

## 2023-05-02 DIAGNOSIS — T8149XA Infection following a procedure, other surgical site, initial encounter: Secondary | ICD-10-CM

## 2023-05-02 DIAGNOSIS — I2510 Atherosclerotic heart disease of native coronary artery without angina pectoris: Secondary | ICD-10-CM | POA: Diagnosis present

## 2023-05-02 DIAGNOSIS — E785 Hyperlipidemia, unspecified: Secondary | ICD-10-CM | POA: Diagnosis present

## 2023-05-02 DIAGNOSIS — Z9841 Cataract extraction status, right eye: Secondary | ICD-10-CM

## 2023-05-02 DIAGNOSIS — M7989 Other specified soft tissue disorders: Secondary | ICD-10-CM | POA: Diagnosis not present

## 2023-05-02 DIAGNOSIS — N179 Acute kidney failure, unspecified: Secondary | ICD-10-CM | POA: Diagnosis present

## 2023-05-02 DIAGNOSIS — N1831 Chronic kidney disease, stage 3a: Secondary | ICD-10-CM | POA: Diagnosis present

## 2023-05-02 DIAGNOSIS — Z961 Presence of intraocular lens: Secondary | ICD-10-CM | POA: Diagnosis present

## 2023-05-02 DIAGNOSIS — Z9842 Cataract extraction status, left eye: Secondary | ICD-10-CM

## 2023-05-02 DIAGNOSIS — R2681 Unsteadiness on feet: Secondary | ICD-10-CM

## 2023-05-02 DIAGNOSIS — T8141XA Infection following a procedure, superficial incisional surgical site, initial encounter: Secondary | ICD-10-CM | POA: Diagnosis present

## 2023-05-02 DIAGNOSIS — I130 Hypertensive heart and chronic kidney disease with heart failure and stage 1 through stage 4 chronic kidney disease, or unspecified chronic kidney disease: Secondary | ICD-10-CM | POA: Diagnosis present

## 2023-05-02 DIAGNOSIS — Z7982 Long term (current) use of aspirin: Secondary | ICD-10-CM

## 2023-05-02 LAB — CBC WITH DIFFERENTIAL/PLATELET
Abs Immature Granulocytes: 0.05 10*3/uL (ref 0.00–0.07)
Basophils Absolute: 0.1 10*3/uL (ref 0.0–0.1)
Basophils Relative: 1 %
Eosinophils Absolute: 0.5 10*3/uL (ref 0.0–0.5)
Eosinophils Relative: 4 %
HCT: 31.2 % — ABNORMAL LOW (ref 39.0–52.0)
Hemoglobin: 10 g/dL — ABNORMAL LOW (ref 13.0–17.0)
Immature Granulocytes: 0 %
Lymphocytes Relative: 22 %
Lymphs Abs: 2.7 10*3/uL (ref 0.7–4.0)
MCH: 30 pg (ref 26.0–34.0)
MCHC: 32.1 g/dL (ref 30.0–36.0)
MCV: 93.7 fL (ref 80.0–100.0)
Monocytes Absolute: 1.1 10*3/uL — ABNORMAL HIGH (ref 0.1–1.0)
Monocytes Relative: 9 %
Neutro Abs: 7.8 10*3/uL — ABNORMAL HIGH (ref 1.7–7.7)
Neutrophils Relative %: 64 %
Platelets: 203 10*3/uL (ref 150–400)
RBC: 3.33 MIL/uL — ABNORMAL LOW (ref 4.22–5.81)
RDW: 13.5 % (ref 11.5–15.5)
WBC: 12.2 10*3/uL — ABNORMAL HIGH (ref 4.0–10.5)
nRBC: 0 % (ref 0.0–0.2)

## 2023-05-02 LAB — COMPREHENSIVE METABOLIC PANEL
ALT: 31 U/L (ref 0–44)
AST: 24 U/L (ref 15–41)
Albumin: 3.7 g/dL (ref 3.5–5.0)
Alkaline Phosphatase: 61 U/L (ref 38–126)
Anion gap: 11 (ref 5–15)
BUN: 27 mg/dL — ABNORMAL HIGH (ref 8–23)
CO2: 21 mmol/L — ABNORMAL LOW (ref 22–32)
Calcium: 8.7 mg/dL — ABNORMAL LOW (ref 8.9–10.3)
Chloride: 99 mmol/L (ref 98–111)
Creatinine, Ser: 1.39 mg/dL — ABNORMAL HIGH (ref 0.61–1.24)
GFR, Estimated: 51 mL/min — ABNORMAL LOW (ref >60)
Glucose, Bld: 115 mg/dL — ABNORMAL HIGH (ref 70–99)
Potassium: 4.4 mmol/L (ref 3.5–5.1)
Sodium: 131 mmol/L — ABNORMAL LOW (ref 135–145)
Total Bilirubin: 0.8 mg/dL (ref 0.3–1.2)
Total Protein: 7.5 g/dL (ref 6.5–8.1)

## 2023-05-02 LAB — LACTIC ACID, PLASMA: Lactic Acid, Venous: 1.4 mmol/L (ref 0.5–1.9)

## 2023-05-02 MED ORDER — IOHEXOL 300 MG/ML  SOLN
100.0000 mL | Freq: Once | INTRAMUSCULAR | Status: AC | PRN
  Administered 2023-05-03: 100 mL via INTRAVENOUS

## 2023-05-02 MED ORDER — LACTATED RINGERS IV SOLN: INTRAVENOUS | Status: DC

## 2023-05-02 MED ORDER — SODIUM CHLORIDE 0.9 % IV SOLN
2.0000 g | Freq: Once | INTRAVENOUS | Status: AC
  Administered 2023-05-03: 2 g via INTRAVENOUS
  Filled 2023-05-02: qty 20

## 2023-05-02 MED ORDER — VANCOMYCIN HCL IN DEXTROSE 1-5 GM/200ML-% IV SOLN
1000.0000 mg | Freq: Once | INTRAVENOUS | Status: DC
  Filled 2023-05-02: qty 200

## 2023-05-02 MED ORDER — SODIUM CHLORIDE 0.9 % IV BOLUS
1000.0000 mL | Freq: Once | INTRAVENOUS | Status: AC
  Administered 2023-05-02: 1000 mL via INTRAVENOUS

## 2023-05-02 NOTE — ED Provider Notes (Signed)
[facility] Provider Note    Event Date/Time   First MD Initiated Contact with Patient 05/02/23 2107     (approximate)   History   Post-op Problem   HPI  [name] is a 82 y.o. male past medical history significant for recent below the knee popliteal artery peroneal artery bypass graft with Dr. Gilda Crease, who presents to the emergency department with swelling and pain to the right leg.  Today noted redness and swelling to his right thigh.  States that he has not been feeling well for the past couple of days with generalized fatigue and having some cold chills.  Denies any fever.  No history of DVT or PE.  No falls or trauma.  No recent antibiotic use.  Denies any dysuria.  Does have a history of diabetes.     Physical Exam   Triage Vital Signs: ED Triage Vitals  Encounter Vitals Group     BP 05/02/23 1456 (!) 140/61     Systolic BP Percentile --      Diastolic BP Percentile --      Pulse Rate 05/02/23 1456 76     Resp 05/02/23 1456 18     Temp 05/02/23 1507 98.4 F (36.9 C)     Temp Source 05/02/23 1507 Oral     SpO2 05/02/23 1456 98 %     Weight --      Height --      Head Circumference --      Peak Flow --      Pain Score 05/02/23 1456 5     Pain Loc --      Pain Education --      Exclude from Growth Chart --     Most recent vital signs: Vitals:   05/02/23 2115 05/02/23 2206  BP:    Pulse: 80   Resp: 14   Temp:  98.6 F (37 C)  SpO2: 100%     Physical Exam Constitutional:      Appearance: He is well-developed.  HENT:     Head: Atraumatic.  Eyes:     Conjunctiva/sclera: Conjunctivae normal.  Cardiovascular:     Rate and Rhythm: Regular rhythm.  Pulmonary:     Effort: No respiratory distress.  Musculoskeletal:     Cervical back: Normal range of motion.     Right lower leg: Edema present.     Comments: Right lower extremity pitting edema.  Staples in place with surrounding erythema and warmth with induration to the  right inner thigh.  Does not extend into the scrotum.  Skin:    General: Skin is warm.     Capillary Refill: Capillary refill takes less than 2 seconds.  Neurological:     Mental Status: He is alert. Mental status is at baseline.     IMPRESSION / MDM / ASSESSMENT AND PLAN / ED COURSE  I reviewed the triage vital signs and the nursing notes.  Differential diagnosis including cellulitis, abscess, seroma, DVT.  No signs of Fournier's gangrene.   No tachycardic or bradycardic dysrhythmias while on cardiac telemetry.  RADIOLOGY my interpretation of imaging: DVT ultrasound with large fluid collection.  No DVT.  LABS (all labs ordered are listed, but only abnormal results are displayed) Labs interpreted as -    Labs Reviewed  COMPREHENSIVE METABOLIC PANEL - Abnormal; Notable for the following components:      Result Value   Sodium 131 (*)    CO2 21 (*)  Glucose, Bld 115 (*)    BUN 27 (*)    Creatinine, Ser 1.39 (*)    Calcium 8.7 (*)    GFR, Estimated 51 (*)    All other components within normal limits  CBC WITH DIFFERENTIAL/PLATELET - Abnormal; Notable for the following components:   WBC 12.2 (*)    RBC 3.33 (*)    Hemoglobin 10.0 (*)    HCT 31.2 (*)    Neutro Abs 7.8 (*)    Monocytes Absolute 1.1 (*)    All other components within normal limits  CULTURE, BLOOD (ROUTINE X 2)  CULTURE, BLOOD (ROUTINE X 2)  LACTIC ACID, PLASMA  LACTIC ACID, PLASMA     MDM    Leukocytosis.  Normal lactic acid.  Creatinine mildly elevated at 1.39.  Clinical picture concerning for cellulitis versus abscess.  Blood cultures added on.  Ordered CT scan with contrast of the thigh to further evaluate for abscess versus seroma.  Started on IV Rocephin and vancomycin to cover for an infectious source.   PROCEDURES:  Critical Care performed: No  Procedures  Patient's presentation is most consistent with acute presentation with potential threat to life or bodily function.   MEDICATIONS  ORDERED IN ED: Medications  iohexol (OMNIPAQUE) 300 MG/ML solution 100 mL (has no administration in time range)  lactated ringers infusion (has no administration in time range)  vancomycin (VANCOCIN) IVPB 1000 mg/200 mL premix (has no administration in time range)  cefTRIAXone (ROCEPHIN) 2 g in sodium chloride 0.9 % 100 mL IVPB (has no administration in time range)  sodium chloride 0.9 % bolus 1,000 mL (0 mLs Intravenous Stopped 05/02/23 2344)    FINAL CLINICAL IMPRESSION(S) / ED DIAGNOSES   Final diagnoses:  Cellulitis, unspecified cellulitis site     Rx / DC Orders   ED Discharge Orders     None        Note:  This document was prepared using Dragon voice recognition software and may include unintentional dictation errors.   Corena Herter, MD 05/02/23 2348

## 2023-05-02 NOTE — ED Triage Notes (Signed)
Pt to ED via POV from physical therapy. Pt reports had vascular bypass surgery 2 weeks ago. Pt reports upper right thigh area is now painful and red and warm to touch. Pt concerned for infection. Pt denies fevers.

## 2023-05-02 NOTE — ED Notes (Signed)
Patient transported to CT 

## 2023-05-03 ENCOUNTER — Encounter
Admission: EM | Disposition: A | Discharge status: Home / Self Care | Payer: Self-pay | Source: Home / Self Care | Attending: Obstetrics and Gynecology

## 2023-05-03 ENCOUNTER — Encounter: Payer: Medicare Other | Admitting: Podiatry

## 2023-05-03 ENCOUNTER — Ambulatory Visit: Payer: Medicare Other | Admitting: Podiatry

## 2023-05-03 DIAGNOSIS — I2510 Atherosclerotic heart disease of native coronary artery without angina pectoris: Secondary | ICD-10-CM | POA: Diagnosis present

## 2023-05-03 DIAGNOSIS — Y835 Amputation of limb(s) as the cause of abnormal reaction of the patient, or of later complication, without mention of misadventure at the time of the procedure: Secondary | ICD-10-CM | POA: Diagnosis present

## 2023-05-03 DIAGNOSIS — E1122 Type 2 diabetes mellitus with diabetic chronic kidney disease: Secondary | ICD-10-CM | POA: Diagnosis present

## 2023-05-03 DIAGNOSIS — I5032 Chronic diastolic (congestive) heart failure: Secondary | ICD-10-CM | POA: Diagnosis present

## 2023-05-03 DIAGNOSIS — M7989 Other specified soft tissue disorders: Secondary | ICD-10-CM | POA: Diagnosis present

## 2023-05-03 DIAGNOSIS — E1151 Type 2 diabetes mellitus with diabetic peripheral angiopathy without gangrene: Secondary | ICD-10-CM | POA: Diagnosis present

## 2023-05-03 DIAGNOSIS — I350 Nonrheumatic aortic (valve) stenosis: Secondary | ICD-10-CM | POA: Diagnosis present

## 2023-05-03 DIAGNOSIS — L7634 Postprocedural seroma of skin and subcutaneous tissue following other procedure: Secondary | ICD-10-CM | POA: Diagnosis present

## 2023-05-03 DIAGNOSIS — Z87891 Personal history of nicotine dependence: Secondary | ICD-10-CM

## 2023-05-03 DIAGNOSIS — Z95828 Presence of other vascular implants and grafts: Secondary | ICD-10-CM

## 2023-05-03 DIAGNOSIS — Z961 Presence of intraocular lens: Secondary | ICD-10-CM | POA: Diagnosis present

## 2023-05-03 DIAGNOSIS — D6489 Other specified anemias: Secondary | ICD-10-CM | POA: Diagnosis present

## 2023-05-03 DIAGNOSIS — L03314 Cellulitis of groin: Secondary | ICD-10-CM

## 2023-05-03 DIAGNOSIS — Z833 Family history of diabetes mellitus: Secondary | ICD-10-CM | POA: Diagnosis not present

## 2023-05-03 DIAGNOSIS — E1142 Type 2 diabetes mellitus with diabetic polyneuropathy: Secondary | ICD-10-CM | POA: Diagnosis present

## 2023-05-03 DIAGNOSIS — Z8249 Family history of ischemic heart disease and other diseases of the circulatory system: Secondary | ICD-10-CM | POA: Diagnosis not present

## 2023-05-03 DIAGNOSIS — Z9889 Other specified postprocedural states: Secondary | ICD-10-CM

## 2023-05-03 DIAGNOSIS — Z7982 Long term (current) use of aspirin: Secondary | ICD-10-CM | POA: Diagnosis not present

## 2023-05-03 DIAGNOSIS — Z7902 Long term (current) use of antithrombotics/antiplatelets: Secondary | ICD-10-CM

## 2023-05-03 DIAGNOSIS — Z9842 Cataract extraction status, left eye: Secondary | ICD-10-CM | POA: Diagnosis not present

## 2023-05-03 DIAGNOSIS — L539 Erythematous condition, unspecified: Secondary | ICD-10-CM | POA: Diagnosis not present

## 2023-05-03 DIAGNOSIS — N179 Acute kidney failure, unspecified: Secondary | ICD-10-CM

## 2023-05-03 DIAGNOSIS — T8149XA Infection following a procedure, other surgical site, initial encounter: Secondary | ICD-10-CM

## 2023-05-03 DIAGNOSIS — Z9841 Cataract extraction status, right eye: Secondary | ICD-10-CM | POA: Diagnosis not present

## 2023-05-03 DIAGNOSIS — Z89422 Acquired absence of other left toe(s): Secondary | ICD-10-CM | POA: Diagnosis not present

## 2023-05-03 DIAGNOSIS — I4510 Unspecified right bundle-branch block: Secondary | ICD-10-CM | POA: Diagnosis present

## 2023-05-03 DIAGNOSIS — N1831 Chronic kidney disease, stage 3a: Secondary | ICD-10-CM | POA: Diagnosis present

## 2023-05-03 DIAGNOSIS — Z79899 Other long term (current) drug therapy: Secondary | ICD-10-CM

## 2023-05-03 DIAGNOSIS — E785 Hyperlipidemia, unspecified: Secondary | ICD-10-CM | POA: Diagnosis present

## 2023-05-03 DIAGNOSIS — Z887 Allergy status to serum and vaccine status: Secondary | ICD-10-CM | POA: Diagnosis not present

## 2023-05-03 DIAGNOSIS — T8141XA Infection following a procedure, superficial incisional surgical site, initial encounter: Secondary | ICD-10-CM | POA: Diagnosis present

## 2023-05-03 DIAGNOSIS — I130 Hypertensive heart and chronic kidney disease with heart failure and stage 1 through stage 4 chronic kidney disease, or unspecified chronic kidney disease: Secondary | ICD-10-CM | POA: Diagnosis present

## 2023-05-03 HISTORY — PX: LOWER EXTREMITY INTERVENTION: CATH118252

## 2023-05-03 LAB — CBC
HCT: 24.9 % — ABNORMAL LOW (ref 39.0–52.0)
Hemoglobin: 8.4 g/dL — ABNORMAL LOW (ref 13.0–17.0)
MCH: 30.5 pg (ref 26.0–34.0)
MCHC: 33.7 g/dL (ref 30.0–36.0)
MCV: 90.5 fL (ref 80.0–100.0)
Platelets: 180 10*3/uL (ref 150–400)
RBC: 2.75 MIL/uL — ABNORMAL LOW (ref 4.22–5.81)
RDW: 13.3 % (ref 11.5–15.5)
WBC: 11.7 10*3/uL — ABNORMAL HIGH (ref 4.0–10.5)
nRBC: 0 % (ref 0.0–0.2)

## 2023-05-03 LAB — BASIC METABOLIC PANEL
Anion gap: 7 (ref 5–15)
BUN: 24 mg/dL — ABNORMAL HIGH (ref 8–23)
CO2: 23 mmol/L (ref 22–32)
Calcium: 8.3 mg/dL — ABNORMAL LOW (ref 8.9–10.3)
Chloride: 107 mmol/L (ref 98–111)
Creatinine, Ser: 1.12 mg/dL (ref 0.61–1.24)
GFR, Estimated: >60 mL/min (ref >60)
Glucose, Bld: 115 mg/dL — ABNORMAL HIGH (ref 70–99)
Potassium: 4.7 mmol/L (ref 3.5–5.1)
Sodium: 137 mmol/L (ref 135–145)

## 2023-05-03 LAB — GLUCOSE, CAPILLARY
Glucose-Capillary: 120 mg/dL — ABNORMAL HIGH (ref 70–99)
Glucose-Capillary: 110 mg/dL — ABNORMAL HIGH (ref 70–99)
Glucose-Capillary: 203 mg/dL — ABNORMAL HIGH (ref 70–99)
Glucose-Capillary: 81 mg/dL (ref 70–99)
Glucose-Capillary: 139 mg/dL — ABNORMAL HIGH (ref 70–99)

## 2023-05-03 LAB — HEMOGLOBIN A1C
Hgb A1c MFr Bld: 5.6 % (ref 4.8–5.6)
Mean Plasma Glucose: 114.02 mg/dL

## 2023-05-03 SURGERY — LOWER EXTREMITY INTERVENTION
Anesthesia: Moderate Sedation | Laterality: Right

## 2023-05-03 MED ORDER — HYDROMORPHONE HCL 1 MG/ML IJ SOLN: 1.0000 mg | Freq: Once | INTRAMUSCULAR | Status: DC | PRN

## 2023-05-03 MED ORDER — METHYLPREDNISOLONE SODIUM SUCC 125 MG IJ SOLR: 125.0000 mg | Freq: Once | INTRAMUSCULAR | Status: DC | PRN

## 2023-05-03 MED ORDER — MIDAZOLAM HCL 2 MG/ML PO SYRP: 8.0000 mg | ORAL_SOLUTION | Freq: Once | ORAL | Status: DC | PRN

## 2023-05-03 MED ORDER — CEFAZOLIN SODIUM-DEXTROSE 2-4 GM/100ML-% IV SOLN: 2.0000 g | INTRAVENOUS | Status: DC

## 2023-05-03 MED ORDER — VANCOMYCIN HCL IN DEXTROSE 1-5 GM/200ML-% IV SOLN: 1000.0000 mg | INTRAVENOUS | Status: DC

## 2023-05-03 MED ORDER — ONDANSETRON HCL 4 MG/2ML IJ SOLN: 4.0000 mg | Freq: Four times a day (QID) | INTRAMUSCULAR | Status: DC | PRN

## 2023-05-03 MED ORDER — MIDAZOLAM HCL 2 MG/2ML IJ SOLN
INTRAMUSCULAR | Status: AC
  Filled 2023-05-03: qty 2

## 2023-05-03 MED ORDER — DIPHENHYDRAMINE HCL 50 MG/ML IJ SOLN: 50.0000 mg | Freq: Once | INTRAMUSCULAR | Status: DC | PRN

## 2023-05-03 MED ORDER — ASPIRIN 81 MG PO TBEC
81.0000 mg | DELAYED_RELEASE_TABLET | Freq: Every day | ORAL | Status: DC
  Administered 2023-05-03: 81 mg via ORAL
  Filled 2023-05-03: qty 1

## 2023-05-03 MED ORDER — FENTANYL CITRATE PF 50 MCG/ML IJ SOSY: 12.5000 ug | PREFILLED_SYRINGE | Freq: Once | INTRAMUSCULAR | Status: DC | PRN

## 2023-05-03 MED ORDER — FAMOTIDINE 20 MG PO TABS: 40.0000 mg | ORAL_TABLET | Freq: Once | ORAL | Status: DC | PRN

## 2023-05-03 MED ORDER — MIDAZOLAM HCL 2 MG/2ML IJ SOLN
INTRAMUSCULAR | Status: DC | PRN
  Administered 2023-05-03: 2 mg via INTRAVENOUS

## 2023-05-03 MED ORDER — CEFAZOLIN SODIUM-DEXTROSE 2-4 GM/100ML-% IV SOLN
INTRAVENOUS | Status: AC
  Filled 2023-05-03: qty 100

## 2023-05-03 MED ORDER — ENOXAPARIN SODIUM 40 MG/0.4ML IJ SOSY
40.0000 mg | PREFILLED_SYRINGE | INTRAMUSCULAR | Status: DC
  Administered 2023-05-03 – 2023-05-05 (×3): 40 mg via SUBCUTANEOUS
  Filled 2023-05-03 (×3): qty 0.4

## 2023-05-03 MED ORDER — HEPARIN SODIUM (PORCINE) 1000 UNIT/ML IJ SOLN
INTRAMUSCULAR | Status: AC
  Filled 2023-05-03: qty 10

## 2023-05-03 MED ORDER — OXYCODONE-ACETAMINOPHEN 5-325 MG PO TABS: 1.0000 | ORAL_TABLET | ORAL | Status: DC | PRN

## 2023-05-03 MED ORDER — VANCOMYCIN HCL 1750 MG/350ML IV SOLN
1750.0000 mg | Freq: Once | INTRAVENOUS | Status: AC
  Administered 2023-05-03: 1750 mg via INTRAVENOUS
  Filled 2023-05-03: qty 350

## 2023-05-03 MED ORDER — VANCOMYCIN HCL IN DEXTROSE 1-5 GM/200ML-% IV SOLN: 1000.0000 mg | Freq: Once | INTRAVENOUS | Status: DC

## 2023-05-03 MED ORDER — VANCOMYCIN HCL 1250 MG/250ML IV SOLN
1250.0000 mg | INTRAVENOUS | Status: DC
  Administered 2023-05-04 – 2023-05-05 (×2): 1250 mg via INTRAVENOUS
  Filled 2023-05-03 (×2): qty 250

## 2023-05-03 MED ORDER — DEXTROSE 50 % IV SOLN
25.0000 mL | Freq: Once | INTRAVENOUS | Status: AC
  Administered 2023-05-03: 25 mL via INTRAVENOUS

## 2023-05-03 MED ORDER — ACETAMINOPHEN 325 MG PO TABS
650.0000 mg | ORAL_TABLET | Freq: Four times a day (QID) | ORAL | Status: DC | PRN
  Administered 2023-05-04 – 2023-05-05 (×2): 650 mg via ORAL
  Filled 2023-05-03: qty 2

## 2023-05-03 MED ORDER — MORPHINE SULFATE (PF) 2 MG/ML IV SOLN: 2.0000 mg | INTRAVENOUS | Status: DC | PRN

## 2023-05-03 MED ORDER — ONDANSETRON HCL 4 MG/2ML IJ SOLN
4.0000 mg | Freq: Once | INTRAMUSCULAR | Status: AC
  Administered 2023-05-03: 4 mg via INTRAVENOUS
  Filled 2023-05-03: qty 2

## 2023-05-03 MED ORDER — IRBESARTAN 150 MG PO TABS
75.0000 mg | ORAL_TABLET | Freq: Every day | ORAL | Status: DC
  Administered 2023-05-03 – 2023-05-05 (×3): 75 mg via ORAL
  Filled 2023-05-03 (×3): qty 1

## 2023-05-03 MED ORDER — ONDANSETRON HCL 4 MG PO TABS: 4.0000 mg | ORAL_TABLET | Freq: Four times a day (QID) | ORAL | Status: DC | PRN

## 2023-05-03 MED ORDER — ACETAMINOPHEN 650 MG RE SUPP: 650.0000 mg | Freq: Four times a day (QID) | RECTAL | Status: DC | PRN

## 2023-05-03 MED ORDER — CEFAZOLIN SODIUM-DEXTROSE 1-4 GM/50ML-% IV SOLN
INTRAVENOUS | Status: AC | PRN
  Administered 2023-05-03: 2 g via INTRAVENOUS

## 2023-05-03 MED ORDER — SODIUM CHLORIDE 0.9 % IV SOLN: INTRAVENOUS | Status: DC

## 2023-05-03 MED ORDER — INSULIN ASPART 100 UNIT/ML IJ SOLN: 0.0000 [IU] | Freq: Every day | INTRAMUSCULAR | Status: DC

## 2023-05-03 MED ORDER — DEXTROSE 50 % IV SOLN
INTRAVENOUS | Status: AC
  Filled 2023-05-03: qty 50

## 2023-05-03 MED ORDER — ROSUVASTATIN CALCIUM 10 MG PO TABS
10.0000 mg | ORAL_TABLET | Freq: Every day | ORAL | Status: DC
  Administered 2023-05-03 – 2023-05-05 (×3): 10 mg via ORAL
  Filled 2023-05-03 (×3): qty 1

## 2023-05-03 MED ORDER — POLYETHYLENE GLYCOL 3350 17 G PO PACK
17.0000 g | PACK | Freq: Every day | ORAL | Status: DC
  Administered 2023-05-04 – 2023-05-05 (×2): 17 g via ORAL
  Filled 2023-05-03 (×3): qty 1

## 2023-05-03 MED ORDER — METOPROLOL TARTRATE 25 MG PO TABS
12.5000 mg | ORAL_TABLET | Freq: Two times a day (BID) | ORAL | Status: DC
  Administered 2023-05-03 – 2023-05-05 (×4): 12.5 mg via ORAL
  Filled 2023-05-03 (×4): qty 1

## 2023-05-03 MED ORDER — SODIUM CHLORIDE 0.9 % IV SOLN
2.0000 g | Freq: Two times a day (BID) | INTRAVENOUS | Status: DC
  Administered 2023-05-03 – 2023-05-05 (×5): 2 g via INTRAVENOUS
  Filled 2023-05-03 (×6): qty 12.5

## 2023-05-03 MED ORDER — INSULIN ASPART 100 UNIT/ML IJ SOLN
0.0000 [IU] | Freq: Three times a day (TID) | INTRAMUSCULAR | Status: DC
  Administered 2023-05-03: 5 [IU] via SUBCUTANEOUS
  Administered 2023-05-04: 2 [IU] via SUBCUTANEOUS
  Administered 2023-05-05: 5 [IU] via SUBCUTANEOUS
  Filled 2023-05-03 (×3): qty 1

## 2023-05-03 MED ORDER — FENTANYL CITRATE PF 50 MCG/ML IJ SOSY
PREFILLED_SYRINGE | INTRAMUSCULAR | Status: AC
  Filled 2023-05-03: qty 1

## 2023-05-03 MED ORDER — LIDOCAINE HCL (PF) 1 % IJ SOLN
INTRAMUSCULAR | Status: DC | PRN
  Administered 2023-05-03: 10 mL via INTRADERMAL

## 2023-05-03 SURGICAL SUPPLY — 2 items
DRAPE BRACHIAL (DRAPES) IMPLANT
TRAY LACERAT/PLASTIC (MISCELLANEOUS) IMPLANT

## 2023-05-03 NOTE — ED Notes (Signed)
Patient has incision on the inner right thigh that is bright red and blanchable. No drainage noted. Warm to touch. Staples present

## 2023-05-03 NOTE — Op Note (Signed)
    OPERATIVE NOTE   PROCEDURE: Ultrasound guided needle aspiration of right thigh thigh seroma  PRE-OPERATIVE DIAGNOSIS: Post operative right thigh seroma  POST-OPERATIVE DIAGNOSIS: Same  SURGEON: [name]  ASSISTANT(S): none  ANESTHESIA: IV sedation.  Continuous ECG pulse oximetry and cardiopulmonary monitoring was performed throughout the entire procedure by the interventional radiology nurse.  A total of 2 mg of parenteral Versed were administered.  ESTIMATED BLOOD LOSS: 0 cc  FINDING(S): Aspirated 5 cc of straw colored fluid  SPECIMEN(S): Culture sent to pathology  INDICATIONS:   [name] is a 82 y.o. male who presents with a right thigh seroma.  There is thought that this may be infected.  Needle aspiration for sulture is bing done to evaluate this.  Risk and benefits were reviewed the patient.  Indications for the procedure were reviewed.  All questions were answered, the patient agrees to proceed. .  DESCRIPTION: After full informed written consent was obtained from the patient, the patient was brought back to the operating room and placed supine upon the operating table.  Prior to induction, the patient received IV antibiotics.   After obtaining adequate anesthesia, the patient was then prepped and draped in the standard fashion for an ultrasound guided needle aspiration of a fluid collection right thigh.  Ultrasound was placed in a sterile sleeve.  It was then used to evaluate the fluid collection which was readily identified.  This area is echolucent.  Images recorded for the permanent record 1% lidocaine is infiltrated into the skin and under direct ultrasound visualization a 18-gauge needle is inserted directly into the fluid collection and it is aspirated.  Approximately 5 cc of straw-colored fluid was removed.  This was then passed off the field and placed in a sterile specimen jar for culture in the lab.  I then removed the staples from both the calf  incision as well as the thigh incision and dressed the wound.   The patient tolerated this procedure well.   COMPLICATIONS: None  CONDITION: [name] [facility]  Office: [phone]   05/03/2023, 4:44 PM

## 2023-05-03 NOTE — Progress Notes (Signed)
   Subjective: Day of Surgery Procedure(s) (LRB): LOWER EXTREMITY INTERVENTION (Right) This/P amputation RT second toe DOS: 04/16/2023  Readmitted for complications arising prior popliteal peroneal artery bypass graft.  Redness, warmth, increased pain right upper thigh from where the saphenous vein graft was harvested.  Underwent aspiration of right thigh seroma earlier today.  Patient was originally scheduled for outpatient follow-up in our office today.  Objective: Vital signs in last 24 hours: Temp:  [98 F (36.7 C)-99.1 F (37.3 C)] 99 F (37.2 C) (08/16 1446) Pulse Rate:  [65-84] 68 (08/16 1700) Resp:  [10-25] 20 (08/16 1700) BP: (119-153)/(48-123) 140/48 (08/16 1645) SpO2:  [89 %-100 %] 95 % (08/16 1700) Weight:  [73.9 kg-77.1 kg] 73.9 kg (08/16 1446)  Recent Labs    05/02/23 1458 05/03/23 0613  HGB 10.0* 8.4*   Recent Labs    05/02/23 1458 05/03/23 0613  WBC 12.2* 11.7*  RBC 3.33* 2.75*  HCT 31.2* 24.9*  PLT 203 180   Recent Labs    05/02/23 1458 05/03/23 0613  NA 131* 137  K 4.4 4.7  CL 99 107  CO2 21* 23  BUN 27* 24*  CREATININE 1.39* 1.12  GLUCOSE 115* 115*  CALCIUM 8.7* 8.3*   No results for input(s): "LABPT", "INR" in the last 72 hours.  Physical Exam: Amputation site stable.  No drainage.  No erythema.  The soft tissue at the amputation site is questionable concerning its viability but it appears very stable.  No malodor.  Assessment/Plan: Day of Surgery Procedure(s) (LRB): LOWER EXTREMITY INTERVENTION (Right) S/P RT second toe amputation DOS: 04/16/2023  -Patient seen at bedside this evening.  Spouse present -Betadine wet-to-dry dressings applied.  Leave clean dry and intact -Patient has follow-up appointment in office 05/07/2023 -WBAT surgical shoe -Podiatry will sign off    Felecia Shelling 05/03/2023, 5:27 PM  Felecia Shelling, DPM [facility]  Dr. Felecia Shelling, DPM    [address] [place], [place] [address]                Office [phone]  Fax [phone]

## 2023-05-03 NOTE — ED Notes (Signed)
Patient had episode of vomiting and dry heaving after initial start of vancomycin. Vancomycin paused. Nausea improved. Vancomycin restarted. No further c/o nausea. MD provider notified. No new orders received.

## 2023-05-03 NOTE — Progress Notes (Signed)
Pharmacy Antibiotic Note  [name] is a 82 y.o. male admitted on 05/02/2023 with cellulitis.  Pharmacy has been consulted for Vancomycin, Cefepime dosing.  Plan: Cefepime 2 gm IV Q12H ordered to start on 8/16 @ 0230.  Vancomycin 1750 mg IV X 1 on 8/16 @ 0128. Vancomycin 1 gm IV Q24H ordered to start on 8/17 @ 0100.  AUC = 482.9 Vanc trough = 12.9   Height: 5\' 8"  (172.7 cm) Weight: 77.1 kg (170 lb) IBW/kg (Calculated) : 68.4  Temp (24hrs), Avg:98.7 F (37.1 C), Min:98.4 F (36.9 C), Max:99.1 F (37.3 C)  Recent Labs  Lab 05/02/23 1458 05/02/23 2205  WBC 12.2*  --   CREATININE 1.39*  --   LATICACIDVEN  --  1.4    Estimated Creatinine Clearance: 39.6 mL/min (A) (by C-G formula based on SCr of 1.39 mg/dL (H)).    Allergies  Allergen Reactions   Haemophilus B Polysaccharide Vaccine Nausea And Vomiting and Other (See Comments)    Fever, chills, vomiting.   Influenza Virus Vaccine Other (See Comments) and Nausea And Vomiting    Fever, chills, vomiting.     Antimicrobials this admission:   >>    >>   Dose adjustments this admission:   Microbiology results:  BCx:   UCx:    Sputum:    MRSA PCR:   Thank you for allowing pharmacy to be a part of this patient's care.  Alaney Witter D 05/03/2023 2:22 AM

## 2023-05-03 NOTE — Assessment & Plan Note (Addendum)
PVD  s/p left transmetatarsal amputation, s/p right second toe amputation  S/p popliteal-peroneal artery bypass graft on 7/25 Continue rosuvastatin and aspirin

## 2023-05-03 NOTE — ED Notes (Addendum)
This RN to attempt multiple times to stick patient for blood cultures. Consulted with Brody,RN for ultrasound IV. MD notified of difficulty via secure chat.

## 2023-05-03 NOTE — Progress Notes (Signed)
PHARMACY -  BRIEF ANTIBIOTIC NOTE   Pharmacy has received consult(s) for Vancomycin from an ED provider.  The patient's profile has been reviewed for ht/wt/allergies/indication/available labs.    One time order(s) placed for Vancomycin 1750 mg IV X 1   Further antibiotics/pharmacy consults should be ordered by admitting physician if indicated.                       Thank you, Kanton Kamel D 05/03/2023  1:15 AM

## 2023-05-03 NOTE — Assessment & Plan Note (Signed)
S/p right saphenous vein harvest from right thigh for popliteal-peroneal artery bypass(04/11/23) Ultrasound showing 6.4 x 3.2 x 5.5 cm postsurgical fluid collection in the right upper  thigh, at the site of recent vein harvest Continue Rocephin and vancomycin Pain control Follow blood cultures Vascular consulted

## 2023-05-03 NOTE — Plan of Care (Signed)
  Problem: Activity: Goal: Ability to tolerate increased activity will improve Outcome: Progressing   Problem: Bowel/Gastric: Goal: Gastrointestinal status for postoperative course will improve Outcome: Progressing   Problem: Clinical Measurements: Goal: Postoperative complications will be avoided or minimized Outcome: Progressing   Problem: Skin Integrity: Goal: Demonstration of wound healing without infection will improve Outcome: Progressing

## 2023-05-03 NOTE — H&P (Signed)
History and Physical    Patient: [name]:[id number] DOB: August 12, 1941 DOA: 05/02/2023 DOS: the patient was seen and examined on 05/03/2023 PCP: Wilford Corner, PA-C  Patient coming from: Home  Chief Complaint:  Chief Complaint  Patient presents with   Post-op Problem    HPI: [name] is a 82 y.o. male with medical history significant for HTN, DM, CAD, diastolic CHF, PVD s/p left transmetatarsal amputation, s/p amputation right second toe on 7/30 with prior popliteal-peroneal artery bypass graft on 7/25 to the ED with pain, redness, warmth in the right upper thigh from where the saphenous vein graft was harvested.  He denies fever or chills. ED course and data review: BP 140/61, mild tachypnea but vitals otherwise WNL. Labs:Hemoglobin WBC 12,000 with lactic acid 1.4.  Hemoglobin 10 which is his baseline.  Creatinine 1.39 up from baseline of 1.05, sodium 131. Venous ultrasound negative for DVT but showing a postsurgical fluid collection as follows "16.4 x 3.2 x 5.5 cm postsurgical fluid collection in the right upper  thigh, at the site of recent vein harvest"  CT of the femur shows the following: IMPRESSION: Early developing abscesses versus infected seromas in the anteromedial right upper thigh, as described above. Overlying cellulitis.  Patient started on ceftriaxone and vancomycin Hospitalist consulted for admission.   Review of Systems: As mentioned in the history of present illness. All other systems reviewed and are negative.  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   a.) TTE 08/27/2022: Ao sinus 4.1, asc Ao 3.7, ST junction 3.3   Atherosclerotic PVD with intermittent claudication (HCC)    a.) s/p vascular intervention 03/26/2018:  post tibial and peroneal crosser athrectomy, PTA LEFT post tibial, SFA, and popliteal arteries; b.)  s/p PTA 03/20/2023: RIGHT SFA and popliteal arteries   Bilateral carotid artery disease (HCC) 08/22/2022   a.) carotid doppler 08/22/2022: <50 LICA, 50-69% RICA   Blurry vision, bilateral    CAD (coronary artery disease) 09/21/2022   a.) cCTA 09/21/2022: 591 (57 %ile for age/sex/race matched control)   Chest pain    Chronic heart failure with preserved ejection fraction (HFpEF) (HCC)    a.) TTE 08/27/2022: EF >55%, aym LVH, mild LAE, triv AR/PR, mild MR/TR. mild AS, Ao root dilatation, G1DD   CKD (chronic kidney disease), stage III (HCC)    Gangrene of toe of right foot (HCC)    a.) RIGHT second toe   Heart murmur    History of bilateral cataract extraction 2020   HLD (hyperlipidemia)    Hypertension    Incomplete right bundle branch block (RBBB)    Long term current use of antithrombotics/antiplatelets    a.) clopidogrel   Neuropathy    Palpitations    T2DM (type 2 diabetes mellitus) (HCC)    Vertigo    Wears dentures    Full upper and lower (loose)   Past Surgical History:  Procedure Laterality Date   AMPUTATION TOE Left 03/28/2018   Procedure: AMPUTATION TOE/ PARTIAL RAY RESECTION-LEFT 2ND AND 3RD;  Surgeon: Linus Galas, DPM;  Location: [facility];  Service: Podiatry;  Laterality: Left;   AMPUTATION TOE Right 04/16/2023   Procedure: AMPUTATION TOE;  Surgeon: Felecia Shelling, DPM;  Location: [facility];  Service: Orthopedics/Podiatry;  Laterality: Right;   CATARACT EXTRACTION W/PHACO Left 02/10/2019   Procedure: CATARACT EXTRACTION PHACO AND INTRAOCULAR LENS PLACEMENT (IOC)  LEFT DIABETIC;  Surgeon: Nevada Crane, MD;  Location: [facility];  Service: Ophthalmology;  Laterality: Left;  Diabetic - oral meds   CATARACT EXTRACTION W/PHACO Right 04/20/2019   Procedure: CATARACT EXTRACTION PHACO AND INTRAOCULAR LENS PLACEMENT (IOC)  RIGHT DIABETIC;  Surgeon: Nevada Crane, MD;  Location: [facility];  Service: Ophthalmology;  Laterality: Right;   FEMORAL-TIBIAL  BYPASS GRAFT Right 04/11/2023   Procedure: BYPASS GRAFT FEMORAL-TIBIAL ARTERY ( POPLITEAL TO PERONEAL);  Surgeon: Renford Dills, MD;  Location: [facility];  Service: Vascular;  Laterality: Right;   HERNIA REPAIR     LOWER EXTREMITY ANGIOGRAPHY Left 03/26/2018   Procedure: Lower Extremity Angiography;  Surgeon: Renford Dills, MD;  Location: [facility];  Service: Cardiovascular;  Laterality: Left;   LOWER EXTREMITY ANGIOGRAPHY Right 03/20/2023   Procedure: Lower Extremity Angiography;  Surgeon: Renford Dills, MD;  Location: [facility];  Service: Cardiovascular;  Laterality: Right;   Percutaneous transluminal angioplasty right superficial femoral artery      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   Procedure: TRANSMETATARSAL AMPUTATION;  Surgeon: Recardo Evangelist, DPM;  Location: [facility];  Service: Podiatry;  Laterality: Left;   Social History:  reports that he quit smoking about 39 years ago. His smoking use included cigarettes. He has never used smokeless tobacco. He reports that he does not drink alcohol and does not use drugs.  Allergies  Allergen Reactions   Haemophilus B Polysaccharide Vaccine Nausea And Vomiting and Other (See Comments)    Fever, chills, vomiting.   Influenza Virus Vaccine Other (See Comments) and Nausea And Vomiting    Fever, chills, vomiting.     Family History  Problem Relation Age of Onset   Diabetes Mother    Cancer Father    Hypertension Maternal Grandfather    Heart attack Maternal Grandfather     Prior to Admission medications   Medication Sig Start Date End Date Taking? Authorizing Provider  acetaminophen (TYLENOL) 325 MG tablet Take 2 tablets (650 mg total) by mouth every 6 (six) hours as needed for mild pain or fever. 03/21/23   Loyce Dys, MD  amoxicillin-clavulanate (AUGMENTIN) 500-125 MG tablet Take 1 tablet by mouth 3 (three) times daily. 04/17/23   Marcie Bal, NP  aspirin EC 81 MG tablet Take 1 tablet (81  mg total) by mouth daily at 6 (six) AM. Swallow whole. 04/18/23   Marcie Bal, NP  clopidogrel (PLAVIX) 75 MG tablet Take 75 mg by mouth daily.    [provider]  docusate sodium (COLACE) 100 MG capsule Take 1 capsule (100 mg total) by mouth daily. 04/18/23   Pace, Peggye Ley, NP  metoprolol tartrate (LOPRESSOR) 25 MG tablet Take tablet TWO hours prior to his cardiac CT scan. Patient not taking: Reported on 04/04/2023 03/21/23   Loyce Dys, MD  Multiple Vitamin (MULTIVITAMIN) tablet Take 1 tablet by mouth 3 (three) times a week.    [provider]  oxyCODONE-acetaminophen (PERCOCET/ROXICET) 5-325 MG tablet Take 1-2 tablets by mouth every 4 (four) hours as needed for moderate pain. 04/17/23   Pace, Peggye Ley, NP  rosuvastatin (CRESTOR) 10 MG tablet Take 1 tablet (10 mg total) by mouth daily. 04/18/23   Pace, Peggye Ley, NP  valsartan (DIOVAN) 80 MG tablet Take 1 tablet (80 mg total) by mouth daily. 03/21/23   Loyce Dys, MD    Physical Exam: Vitals:   05/03/23 0030 05/03/23 0045 05/03/23 0100 05/03/23 0115  BP:    Marland Kitchen)  140/58  Pulse:      Resp: 16 (!) 23 15 19   Temp:      TempSrc:      SpO2:      Weight:      Height:       Physical Exam Vitals and nursing note reviewed.  Constitutional:      General: He is not in acute distress. HENT:     Head: Normocephalic and atraumatic.  Cardiovascular:     Rate and Rhythm: Normal rate and regular rhythm.     Heart sounds: Normal heart sounds.  Pulmonary:     Effort: Pulmonary effort is normal.     Breath sounds: Normal breath sounds.  Abdominal:     Palpations: Abdomen is soft.     Tenderness: There is no abdominal tenderness.  Musculoskeletal:     Comments: See picture of groin below  Neurological:     Mental Status: Mental status is at baseline.        Labs on Admission: I have personally reviewed following labs and imaging studies  CBC: Recent Labs  Lab 05/02/23 1458  WBC 12.2*  NEUTROABS 7.8*  HGB 10.0*  HCT  31.2*  MCV 93.7  PLT 203   Basic Metabolic Panel: Recent Labs  Lab 05/02/23 1458  NA 131*  K 4.4  CL 99  CO2 21*  GLUCOSE 115*  BUN 27*  CREATININE 1.39*  CALCIUM 8.7*   GFR: Estimated Creatinine Clearance: 39.6 mL/min (A) (by C-G formula based on SCr of 1.39 mg/dL (H)). Liver Function Tests: Recent Labs  Lab 05/02/23 1458  AST 24  ALT 31  ALKPHOS 61  BILITOT 0.8  PROT 7.5  ALBUMIN 3.7   No results for input(s): "LIPASE", "AMYLASE" in the last 168 hours. No results for input(s): "AMMONIA" in the last 168 hours. Coagulation Profile: No results for input(s): "INR", "PROTIME" in the last 168 hours. Cardiac Enzymes: No results for input(s): "CKTOTAL", "CKMB", "CKMBINDEX", "TROPONINI" in the last 168 hours. BNP (last 3 results) No results for input(s): "PROBNP" in the last 8760 hours. HbA1C: No results for input(s): "HGBA1C" in the last 72 hours. CBG: No results for input(s): "GLUCAP" in the last 168 hours. Lipid Profile: No results for input(s): "CHOL", "HDL", "LDLCALC", "TRIG", "CHOLHDL", "LDLDIRECT" in the last 72 hours. Thyroid Function Tests: No results for input(s): "TSH", "T4TOTAL", "FREET4", "T3FREE", "THYROIDAB" in the last 72 hours. Anemia Panel: No results for input(s): "VITAMINB12", "FOLATE", "FERRITIN", "TIBC", "IRON", "RETICCTPCT" in the last 72 hours. Urine analysis:    Component Value Date/Time   COLORURINE YELLOW (A) 03/19/2023 1518   APPEARANCEUR CLEAR (A) 03/19/2023 1518   LABSPEC 1.009 03/19/2023 1518   PHURINE 5.0 03/19/2023 1518   GLUCOSEU NEGATIVE 03/19/2023 1518   HGBUR NEGATIVE 03/19/2023 1518   BILIRUBINUR NEGATIVE 03/19/2023 1518   KETONESUR NEGATIVE 03/19/2023 1518   PROTEINUR NEGATIVE 03/19/2023 1518   NITRITE NEGATIVE 03/19/2023 1518   LEUKOCYTESUR NEGATIVE 03/19/2023 1518    Radiological Exams on Admission: CT FEMUR RIGHT W CONTRAST  Result Date: 05/03/2023 CLINICAL DATA:  Status post vascular bypass, right upper thigh  redness/pain/warmth, concern for cellulitis versus abscess EXAM: CT OF THE LOWER RIGHT EXTREMITY WITH CONTRAST TECHNIQUE: Multidetector CT imaging of the lower right extremity was performed according to the standard protocol following intravenous contrast administration. RADIATION DOSE REDUCTION: This exam was performed according to the departmental dose-optimization program which includes automated exposure control, adjustment of the mA and/or kV according to patient size and/or use of iterative reconstruction  technique. CONTRAST:  OMNIPAQUE IOHEXOL 300 MG/ML  SOLN COMPARISON:  Right lower extremity venous Doppler ultrasound dated 05/02/2023 FINDINGS: No fracture or dislocation is seen. No cortical destruction to suggest osteomyelitis. Hip joint spaces are preserved. Skin staples overlying the anteromedial right upper thigh and lower inguinal region, related to recent vein graft hardest. Interconnected subcutaneous fluid collections in the anteromedial right upper thigh, measuring 3.9 x 4.3 x 13.9 cm in aggregate. Mild peripheral enhancement (series 2/image 124), although without well-defined/thickened wall. However, this appearance favors infected seromas versus early/developing abscesses. Overlying skin thickening with subcutaneous stranding along the medial left upper thigh (series 2/image 106), suggesting cellulitis. IMPRESSION: Early developing abscesses versus infected seromas in the anteromedial right upper thigh, as described above. Overlying cellulitis. Electronically Signed   By: Charline Bills M.D.   On: 05/03/2023 00:21   US Venous Img Lower Unilateral Right  Result Date: 05/02/2023 CLINICAL DATA:  Right leg pain, status post bypass graft EXAM: RIGHT LOWER EXTREMITY VENOUS DOPPLER ULTRASOUND TECHNIQUE: Gray-scale sonography with compression, as well as color and duplex ultrasound, were performed to evaluate the deep venous system(s) from the level of the common femoral vein through the  popliteal and proximal calf veins. COMPARISON:  None Available. FINDINGS: VENOUS Normal compressibility of the common femoral, superficial femoral, and popliteal veins. Calf veins are suboptimally visualized. Visualized portions of profunda femoral vein and great saphenous vein unremarkable. No filling defects to suggest DVT on grayscale or color Doppler imaging. Doppler waveforms show normal direction of venous flow, normal respiratory plasticity and response to augmentation. Limited views of the contralateral common femoral vein are unremarkable. OTHER 16.4 x 3.2 x 5.5 cm elongated fluid collection in the right upper thigh, at the site of recent vein harvest, postsurgical. Limitations: none IMPRESSION: Negative for DVT. 16.4 x 3.2 x 5.5 cm postsurgical fluid collection in the right upper thigh, at the site of recent vein harvest. Electronically Signed   By: Charline Bills M.D.   On: 05/02/2023 21:54     Data Reviewed: Relevant notes from primary care and specialist visits, past discharge summaries as available in EHR, including Care Everywhere. Prior diagnostic testing as pertinent to current admission diagnoses Updated medications and problem lists for reconciliation ED course, including vitals, labs, imaging, treatment and response to treatment Triage notes, nursing and pharmacy notes and ED provider's notes Notable results as noted in HPI   Assessment and Plan: Cellulitis of right groin, surgical site S/p right saphenous vein harvest from right thigh for popliteal-peroneal artery bypass(04/11/23) Ultrasound showing 6.4 x 3.2 x 5.5 cm postsurgical fluid collection in the right upper  thigh, at the site of recent vein harvest Continue Rocephin and vancomycin Pain control Follow blood cultures Vascular consulted  PVD (peripheral vascular disease) (HCC) PVD  s/p left transmetatarsal amputation, s/p right second toe amputation  S/p popliteal-peroneal artery bypass graft on 7/25 Continue  rosuvastatin and aspirin  AKI (acute kidney injury) (HCC) Creatinine 1.39 up from baseline of 1.05 IV hydration  Chronic diastolic CHF (congestive heart failure) (HCC) Clinically euvolemic Continue valsartan and metoprolol  CAD (coronary artery disease) No complaints of chest pain Continue rosuvastatin, metoprolol and aspirin     DVT prophylaxis: Lovenox  Consults: Vascular, Dr. Gilda Crease  Advance Care Planning:   Code Status: Prior   Family Communication: Wife at bedside  Disposition Plan: Back to previous home environment  Severity of Illness: The appropriate patient status for this patient is INPATIENT. Inpatient status is judged to be reasonable and necessary in  order to provide the required intensity of service to ensure the patient's safety. The patient's presenting symptoms, physical exam findings, and initial radiographic and laboratory data in the context of their chronic comorbidities is felt to place them at high risk for further clinical deterioration. Furthermore, it is not anticipated that the patient will be medically stable for discharge from the hospital within 2 midnights of admission.   * I certify that at the point of admission it is my clinical judgment that the patient will require inpatient hospital care spanning beyond 2 midnights from the point of admission due to high intensity of service, high risk for further deterioration and high frequency of surveillance required.*  Author: Andris Baumann, MD 05/03/2023 1:19 AM  For on call review [email or web address].

## 2023-05-03 NOTE — Assessment & Plan Note (Signed)
No complaints of chest pain Continue rosuvastatin, metoprolol and aspirin

## 2023-05-03 NOTE — Progress Notes (Addendum)
PROGRESS NOTE    [name]  UXL:[id number] DOB: [birth year] DOA: 05/02/2023 PCP: Wilford Corner, PA-C  Outpatient Specialists: vascular surgery, cardiology    Brief Narrative:   [name] is a 82 y.o. male with medical history significant for HTN, DM, CAD, diastolic CHF, PVD s/p left transmetatarsal amputation, s/p amputation right second toe on 7/30 with prior popliteal-peroneal artery bypass graft on 7/25 to the ED with pain, redness, warmth in the right upper thigh from where the saphenous vein graft was harvested.  He denies fever or chills. ED course and data review: BP 140/61, mild tachypnea but vitals otherwise WNL. Labs:Hemoglobin WBC 12,000 with lactic acid 1.4.  Hemoglobin 10 which is his baseline.  Creatinine 1.39 up from baseline of 1.05, sodium 131. Venous ultrasound negative for DVT but showing a postsurgical fluid collection as follows "16.4 x 3.2 x 5.5 cm postsurgical fluid collection in the right upper  thigh, at the site of recent vein harvest"   Assessment & Plan:   Principal Problem:   Cellulitis of right groin, surgical site Active Problems:   Surgical wound infection   PVD (peripheral vascular disease) (HCC)   Essential hypertension   AKI (acute kidney injury) (HCC)   Chronic kidney disease, stage 3a (HCC)   Chronic diastolic CHF (congestive heart failure) (HCC)   CAD (coronary artery disease)   Diabetes (HCC)  # Surgical site infection Worsening erythema and swelling medial aspect of right upper thigh incision, u/s showing 6x3x5 fluid collection concerning for abscess vs infected seroma. Hemodynamically stable - continue vanc/cefepime - vascular surgery to see - follow blood culture - oxy for pain, bowel regimen  # Recent right 2nd toe amputation Due for podiatry f/u today - will reach out to podiatry team  # Normocytic anemia Baseline hgb 11s, 10s after recent surgery, this morning 8.4, no report of bleeding, suspect some element  of dilution given ivf - monitor  # PAD - holding home plavix pending vascular surgery eval - cont home aspirin, rosuvastatin  # HTN Here mild bp elevation - cont home irbesartan, metoprolol  # T2DM Diet controlled - monitor  # CKD 3a with acute kidney injury AKI resolved with IV fluids - hold fluids and monitor  # CAD Seen on coronary CT - cont statin/asa as above    DVT prophylaxis: lovenox Code Status: full Family Communication: wife updated @ bedside  Level of care: Med-Surg Status is: Inpatient Remains inpatient appropriate because: severity of illness    Consultants:  vascular  Procedures: Pending?  Antimicrobials:  Vanc/cefepim    Subjective: Reports mild pain right upper thigh otherwise feeling well  Objective: Vitals:   05/03/23 0145 05/03/23 0200 05/03/23 0230 05/03/23 0259  BP:  (!) 129/55 (!) 144/56 (!) 153/54  Pulse: 81 77 77 75  Resp: 10 10 (!) 24 20  Temp:    98 F (36.7 C)  TempSrc:    Oral  SpO2: 100% 100% 99% 99%  Weight:      Height:        Intake/Output Summary (Last 24 hours) at 05/03/2023 0814 Last data filed at 05/03/2023 0736 Gross per 24 hour  Intake --  Output 400 ml  Net -400 ml   Filed Weights   05/03/23 0027  Weight: 77.1 kg    Examination:  General exam: Appears calm and comfortable  Respiratory system: Clear to auscultation. Respiratory effort normal. Cardiovascular system: S1 & S2 heard, RRR. No JVD, murmurs, rubs, gallops or clicks. No pedal  edema. Gastrointestinal system: Abdomen is nondistended, soft and nontender. No organomegaly or masses felt. Normal bowel sounds heard. Central nervous system: Alert and oriented. No focal neurological deficits. Extremities: Symmetric 5 x 5 power. Skin: induration and erythema medial to right upper thigh incision Psychiatry: Judgement and insight appear normal. Mood & affect appropriate.     Data Reviewed: I have personally reviewed following labs and imaging  studies  CBC: Recent Labs  Lab 05/02/23 1458 05/03/23 0613  WBC 12.2* 11.7*  NEUTROABS 7.8*  --   HGB 10.0* 8.4*  HCT 31.2* 24.9*  MCV 93.7 90.5  PLT 203 180   Basic Metabolic Panel: Recent Labs  Lab 05/02/23 1458 05/03/23 0613  NA 131* 137  K 4.4 4.7  CL 99 107  CO2 21* 23  GLUCOSE 115* 115*  BUN 27* 24*  CREATININE 1.39* 1.12  CALCIUM 8.7* 8.3*   GFR: Estimated Creatinine Clearance: 49.2 mL/min (by C-G formula based on SCr of 1.12 mg/dL). Liver Function Tests: Recent Labs  Lab 05/02/23 1458  AST 24  ALT 31  ALKPHOS 61  BILITOT 0.8  PROT 7.5  ALBUMIN 3.7   No results for input(s): "LIPASE", "AMYLASE" in the last 168 hours. No results for input(s): "AMMONIA" in the last 168 hours. Coagulation Profile: No results for input(s): "INR", "PROTIME" in the last 168 hours. Cardiac Enzymes: No results for input(s): "CKTOTAL", "CKMB", "CKMBINDEX", "TROPONINI" in the last 168 hours. BNP (last 3 results) No results for input(s): "PROBNP" in the last 8760 hours. HbA1C: No results for input(s): "HGBA1C" in the last 72 hours. CBG: Recent Labs  Lab 05/03/23 0255 05/03/23 0743  GLUCAP 120* 110*   Lipid Profile: No results for input(s): "CHOL", "HDL", "LDLCALC", "TRIG", "CHOLHDL", "LDLDIRECT" in the last 72 hours. Thyroid Function Tests: No results for input(s): "TSH", "T4TOTAL", "FREET4", "T3FREE", "THYROIDAB" in the last 72 hours. Anemia Panel: No results for input(s): "VITAMINB12", "FOLATE", "FERRITIN", "TIBC", "IRON", "RETICCTPCT" in the last 72 hours. Urine analysis:    Component Value Date/Time   COLORURINE YELLOW (A) 03/19/2023 1518   APPEARANCEUR CLEAR (A) 03/19/2023 1518   LABSPEC 1.009 03/19/2023 1518   PHURINE 5.0 03/19/2023 1518   GLUCOSEU NEGATIVE 03/19/2023 1518   HGBUR NEGATIVE 03/19/2023 1518   BILIRUBINUR NEGATIVE 03/19/2023 1518   KETONESUR NEGATIVE 03/19/2023 1518   PROTEINUR NEGATIVE 03/19/2023 1518   NITRITE NEGATIVE 03/19/2023 1518    LEUKOCYTESUR NEGATIVE 03/19/2023 1518   Sepsis Labs: @LABRCNTIP (procalcitonin:4,lacticidven:4)  ) Recent Results (from the past 240 hour(s))  Blood culture (routine x 2)     Status: None (Preliminary result)   Collection Time: 05/02/23 12:55 AM   Specimen: BLOOD LEFT ARM  Result Value Ref Range Status   Specimen Description BLOOD LEFT ARM  Final   Special Requests   Final    BOTTLES DRAWN AEROBIC AND ANAEROBIC Blood Culture adequate volume   Culture   Final    NO GROWTH < 12 HOURS Performed at [facility], [address], [place], [place] [address]    Report Status PENDING  Incomplete         Radiology Studies: CT FEMUR RIGHT W CONTRAST  Result Date: 05/03/2023 CLINICAL DATA:  Status post vascular bypass, right upper thigh redness/pain/warmth, concern for cellulitis versus abscess EXAM: CT OF THE LOWER RIGHT EXTREMITY WITH CONTRAST TECHNIQUE: Multidetector CT imaging of the lower right extremity was performed according to the standard protocol following intravenous contrast administration. RADIATION DOSE REDUCTION: This exam was performed according to the departmental dose-optimization program  which includes automated exposure control, adjustment of the mA and/or kV according to patient size and/or use of iterative reconstruction technique. CONTRAST:  OMNIPAQUE IOHEXOL 300 MG/ML  SOLN COMPARISON:  Right lower extremity venous Doppler ultrasound dated 05/02/2023 FINDINGS: No fracture or dislocation is seen. No cortical destruction to suggest osteomyelitis. Hip joint spaces are preserved. Skin staples overlying the anteromedial right upper thigh and lower inguinal region, related to recent vein graft hardest. Interconnected subcutaneous fluid collections in the anteromedial right upper thigh, measuring 3.9 x 4.3 x 13.9 cm in aggregate. Mild peripheral enhancement (series 2/image 124), although without well-defined/thickened wall. However, this appearance favors infected  seromas versus early/developing abscesses. Overlying skin thickening with subcutaneous stranding along the medial left upper thigh (series 2/image 106), suggesting cellulitis. IMPRESSION: Early developing abscesses versus infected seromas in the anteromedial right upper thigh, as described above. Overlying cellulitis. Electronically Signed   By: Charline Bills M.D.   On: 05/03/2023 00:21   US Venous Img Lower Unilateral Right  Result Date: 05/02/2023 CLINICAL DATA:  Right leg pain, status post bypass graft EXAM: RIGHT LOWER EXTREMITY VENOUS DOPPLER ULTRASOUND TECHNIQUE: Gray-scale sonography with compression, as well as color and duplex ultrasound, were performed to evaluate the deep venous system(s) from the level of the common femoral vein through the popliteal and proximal calf veins. COMPARISON:  None Available. FINDINGS: VENOUS Normal compressibility of the common femoral, superficial femoral, and popliteal veins. Calf veins are suboptimally visualized. Visualized portions of profunda femoral vein and great saphenous vein unremarkable. No filling defects to suggest DVT on grayscale or color Doppler imaging. Doppler waveforms show normal direction of venous flow, normal respiratory plasticity and response to augmentation. Limited views of the contralateral common femoral vein are unremarkable. OTHER 16.4 x 3.2 x 5.5 cm elongated fluid collection in the right upper thigh, at the site of recent vein harvest, postsurgical. Limitations: none IMPRESSION: Negative for DVT. 16.4 x 3.2 x 5.5 cm postsurgical fluid collection in the right upper thigh, at the site of recent vein harvest. Electronically Signed   By: Charline Bills M.D.   On: 05/02/2023 21:54        Scheduled Meds:  aspirin EC  81 mg Oral Q0600   enoxaparin (LOVENOX) injection  40 mg Subcutaneous Q24H   insulin aspart  0-15 Units Subcutaneous TID WC   insulin aspart  0-5 Units Subcutaneous QHS   irbesartan  75 mg Oral Daily   metoprolol  tartrate  12.5 mg Oral BID   rosuvastatin  10 mg Oral Daily   Continuous Infusions:  ceFEPime (MAXIPIME) IV 2 g (05/03/23 0510)   lactated ringers 150 mL/hr at 05/03/23 0736   [START ON 05/04/2023] vancomycin       LOS: 0 days     Silvano Bilis, MD [facility]   If 7PM-7AM, please contact night-coverage [email or web address] Password [email or web address] 05/03/2023, 8:14 AM

## 2023-05-03 NOTE — Assessment & Plan Note (Signed)
Clinically euvolemic Continue valsartan and metoprolol

## 2023-05-03 NOTE — Assessment & Plan Note (Signed)
Creatinine 1.39 up from baseline of 1.05 IV hydration

## 2023-05-03 NOTE — Progress Notes (Signed)
   05/03/23 1000  Spiritual Encounters  Type of Visit Initial  Care provided to: Pt and family  Referral source Nurse (RN/NT/LPN)  Reason for visit Advance directives  OnCall Visit No  Spiritual Framework  Presenting Themes Community and relationships  Patient Stress Factors Not reviewed  Family Stress Factors Not reviewed  Interventions  Spiritual Care Interventions Made Established relationship of care and support;Compassionate presence;Reflective listening  Intervention Outcomes  Outcomes Connection to spiritual care  Spiritual Care Plan  Spiritual Care Issues Still Outstanding No further spiritual care needs at this time (see row info)   Patient and family did not request AD for patient. I still educated them on the process of AD. Advise them that a Chaplain is around 24/7 if the need to speak with some one. Wife said she takes care of all her husband medical needs and there is no need for AD.

## 2023-05-03 NOTE — Consult Note (Signed)
Hospital Consult    Reason for Consult:  Right Thigh Post Surgical Seroma/Cellulitis Requesting Physician:  Dr Lindajo Royal MD MRN #:  [id number]  History of Present Illness: This is a 82 y.o. male now status postop from a right below the knee popliteal artery to peroneal artery bypass graft with reverse saphenous vein graft on 04/11/2023. He returns to the emergency department with erythema to his right upper thigh.  Upon work up he was noted to have a seroma/fluid collection with possible abscess in his right lower extremity/ thigh. He endorses having chills one night earlier in the week with increased redness but no fevers. Highest recorded temperature was 97.7 reported by his wife. He denies any chest pain, shortness of breath, dizziness, N/V/D.     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   a.) TTE 08/27/2022: Ao sinus 4.1, asc Ao 3.7, ST junction 3.3   Atherosclerotic PVD with intermittent claudication (HCC)    a.) s/p vascular intervention 03/26/2018:  post tibial and peroneal crosser athrectomy, PTA LEFT post tibial, SFA, and popliteal arteries; b.) s/p PTA 03/20/2023: RIGHT SFA and popliteal arteries   Bilateral carotid artery disease (HCC) 08/22/2022   a.) carotid doppler 08/22/2022: <50 LICA, 50-69% RICA   Blurry vision, bilateral    CAD (coronary artery disease) 09/21/2022   a.) cCTA 09/21/2022: 591 (57 %ile for age/sex/race matched control)   Chest pain    Chronic heart failure with preserved ejection fraction (HFpEF) (HCC)    a.) TTE 08/27/2022: EF >55%, aym LVH, mild LAE, triv AR/PR, mild MR/TR. mild AS, Ao root dilatation, G1DD   CKD (chronic kidney disease), stage III (HCC)    Gangrene of toe of right foot (HCC)    a.) RIGHT second toe   Heart murmur    History of bilateral cataract extraction 2020   HLD (hyperlipidemia)    Hypertension     Incomplete right bundle branch block (RBBB)    Long term current use of antithrombotics/antiplatelets    a.) clopidogrel   Neuropathy    Palpitations    T2DM (type 2 diabetes mellitus) (HCC)    Vertigo    Wears dentures    Full upper and lower (loose)    Past Surgical History:  Procedure Laterality Date   AMPUTATION TOE Left 03/28/2018   Procedure: AMPUTATION TOE/ PARTIAL RAY RESECTION-LEFT 2ND AND 3RD;  Surgeon: Linus Galas, DPM;  Location: [facility];  Service: Podiatry;  Laterality: Left;   AMPUTATION TOE Right 04/16/2023   Procedure: AMPUTATION TOE;  Surgeon: Felecia Shelling, DPM;  Location: [facility];  Service: Orthopedics/Podiatry;  Laterality: Right;   CATARACT EXTRACTION W/PHACO Left 02/10/2019   Procedure: CATARACT EXTRACTION PHACO AND INTRAOCULAR LENS PLACEMENT (IOC)  LEFT DIABETIC;  Surgeon: Nevada Crane, MD;  Location: [facility];  Service: Ophthalmology;  Laterality: Left;  Diabetic - oral meds   CATARACT EXTRACTION W/PHACO Right 04/20/2019   Procedure: CATARACT EXTRACTION PHACO AND INTRAOCULAR LENS PLACEMENT (IOC)  RIGHT DIABETIC;  Surgeon: Nevada Crane, MD;  Location: [facility];  Service: Ophthalmology;  Laterality: Right;   FEMORAL-TIBIAL BYPASS GRAFT Right 04/11/2023   Procedure: BYPASS GRAFT FEMORAL-TIBIAL ARTERY ( POPLITEAL TO PERONEAL);  Surgeon: Renford Dills, MD;  Location: [facility] OR[facility];  Service: Vascular;  Laterality: Right;   HERNIA REPAIR     LOWER EXTREMITY ANGIOGRAPHY Left 03/26/2018   Procedure:  Lower Extremity Angiography;  Surgeon: Renford Dills, MD;  Location: [facility] INVASIVE CV LAB;  Service: Cardiovascular;  Laterality: Left;   LOWER EXTREMITY ANGIOGRAPHY Right 03/20/2023   Procedure: Lower Extremity Angiography;  Surgeon: Renford Dills, MD;  Location: [facility] INVASIVE CV LAB;  Service: Cardiovascular;  Laterality: Right;   Percutaneous transluminal angioplasty right superficial femoral artery      TRANSMETATARSAL  AMPUTATION Left 07/19/2018   Procedure: TRANSMETATARSAL AMPUTATION;  Surgeon: Recardo Evangelist, DPM;  Location: [facility];  Service: Podiatry;  Laterality: Left;    Allergies  Allergen Reactions   Haemophilus B Polysaccharide Vaccine Nausea And Vomiting and Other (See Comments)    Fever, chills, vomiting.   Influenza Virus Vaccine Other (See Comments) and Nausea And Vomiting    Fever, chills, vomiting.     Prior to Admission medications   Medication Sig Start Date End Date Taking? Authorizing Provider  acetaminophen (TYLENOL) 325 MG tablet Take 2 tablets (650 mg total) by mouth every 6 (six) hours as needed for mild pain or fever. 03/21/23  Yes Loyce Dys, MD  aspirin EC 81 MG tablet Take 1 tablet (81 mg total) by mouth daily at 6 (six) AM. Swallow whole. 04/18/23  Yes Linea Calles R, NP  clopidogrel (PLAVIX) 75 MG tablet Take 75 mg by mouth daily.   Yes [provider]  docusate sodium (COLACE) 100 MG capsule Take 1 capsule (100 mg total) by mouth daily. 04/18/23  Yes Cerria Randhawa R, NP  Multiple Vitamin (MULTIVITAMIN) tablet Take 1 tablet by mouth 3 (three) times a week.   Yes [provider]  oxyCODONE-acetaminophen (PERCOCET/ROXICET) 5-325 MG tablet Take 1-2 tablets by mouth every 4 (four) hours as needed for moderate pain. 04/17/23  Yes Kayleen Alig R, NP  rosuvastatin (CRESTOR) 10 MG tablet Take 1 tablet (10 mg total) by mouth daily. 04/18/23  Yes Vane Yapp R, NP  amoxicillin-clavulanate (AUGMENTIN) 500-125 MG tablet Take 1 tablet by mouth 3 (three) times daily. Patient not taking: Reported on 05/03/2023 04/17/23   Marcie Bal, NP  metoprolol tartrate (LOPRESSOR) 25 MG tablet Take tablet TWO hours prior to his cardiac CT scan. Patient not taking: Reported on 04/04/2023 03/21/23   Loyce Dys, MD  valsartan (DIOVAN) 80 MG tablet Take 1 tablet (80 mg total) by mouth daily. Patient not taking: Reported on 05/03/2023 03/21/23   Loyce Dys, MD    Social History    Socioeconomic History   Marital status: Married    Spouse name: Not on file   Number of children: Not on file   Years of education: Not on file   Highest education level: Not on file  Occupational History   Not on file  Tobacco Use   Smoking status: Former    Current packs/day: 0.00    Types: Cigarettes    Quit date: 38    Years since quitting: 39.6   Smokeless tobacco: Never  Vaping Use   Vaping status: Never Used  Substance and Sexual Activity   Alcohol use: Never    Comment: rare - couple times/yr   Drug use: Never   Sexual activity: Never  Other Topics Concern   Not on file  Social History Narrative   Not on file   Social Determinants of Health   Financial Resource Strain: Low Risk  (01/10/2023)   Received from [facility], [facility]   Overall Financial Resource Strain (CARDIA)    Difficulty of Paying Living Expenses:  Not very hard  Food Insecurity: No Food Insecurity (05/03/2023)   Hunger Vital Sign    Worried About Running Out of Food in the Last Year: Never true    Ran Out of Food in the Last Year: Never true  Transportation Needs: No Transportation Needs (05/03/2023)   PRAPARE - Administrator, Civil Service (Medical): No    Lack of Transportation (Non-Medical): No  Physical Activity: Not on file  Stress: Not on file  Social Connections: Not on file  Intimate Partner Violence: Not At Risk (05/03/2023)   Humiliation, Afraid, Rape, and Kick questionnaire    Fear of Current or Ex-Partner: No    Emotionally Abused: No    Physically Abused: No    Sexually Abused: No     Family History  Problem Relation Age of Onset   Diabetes Mother    Cancer Father    Hypertension Maternal Grandfather    Heart attack Maternal Grandfather     ROS: Otherwise negative unless mentioned in HPI  Physical Examination  Vitals:   05/03/23 0230 05/03/23 0259  BP: (!) 144/56 (!) 153/54  Pulse: 77 75  Resp: (!) 24 20   Temp:  98 F (36.7 C)  SpO2: 99% 99%   Body mass index is 25.85 kg/m.  General:  WDWN in NAD Gait: Not observed HENT: WNL, normocephalic Pulmonary: normal non-labored breathing, without Rales, rhonchi,  wheezing Cardiac: regular, without  Murmurs, rubs or gallops; without carotid bruits Abdomen: Positive bowel sounds, soft, NT/ND, no masses Skin: without rashes Vascular Exam/Pulses: Positive palpable pulses throughout. Extremities: without ischemic changes, without Gangrene , with cellulitis; without open wounds;  Musculoskeletal: no muscle wasting or atrophy  Neurologic: A&O X 3;  No focal weakness or paresthesias are detected; speech is fluent/normal Psychiatric:  The pt has Normal affect. Lymph:  Unremarkable  CBC    Component Value Date/Time   WBC 11.7 (H) 05/03/2023 0613   RBC 2.75 (L) 05/03/2023 0613   HGB 8.4 (L) 05/03/2023 0613   HCT 24.9 (L) 05/03/2023 0613   PLT 180 05/03/2023 0613   MCV 90.5 05/03/2023 0613   MCH 30.5 05/03/2023 0613   MCHC 33.7 05/03/2023 0613   RDW 13.3 05/03/2023 0613   LYMPHSABS 2.7 05/02/2023 1458   MONOABS 1.1 (H) 05/02/2023 1458   EOSABS 0.5 05/02/2023 1458   BASOSABS 0.1 05/02/2023 1458    BMET    Component Value Date/Time   NA 137 05/03/2023 0613   K 4.7 05/03/2023 0613   CL 107 05/03/2023 0613   CO2 23 05/03/2023 0613   GLUCOSE 115 (H) 05/03/2023 0613   BUN 24 (H) 05/03/2023 0613   CREATININE 1.12 05/03/2023 0613   CALCIUM 8.3 (L) 05/03/2023 0613   GFRNONAA >60 05/03/2023 0613   GFRAA >60 07/30/2018 1045    COAGS: Lab Results  Component Value Date   INR 1.2 03/19/2023   INR 1.25 07/18/2018     Non-Invasive Vascular Imaging:   EXAM:05/03/23 CT OF THE LOWER RIGHT EXTREMITY WITH CONTRAST   TECHNIQUE: Multidetector CT imaging of the lower right extremity was performed according to the standard protocol following intravenous contrast administration.   RADIATION DOSE REDUCTION: This exam was performed according to  the departmental dose-optimization program which includes automated exposure control, adjustment of the mA and/or kV according to patient size and/or use of iterative reconstruction technique.   CONTRAST:  OMNIPAQUE IOHEXOL 300 MG/ML  SOLN   COMPARISON:  Right lower extremity venous Doppler ultrasound dated 05/02/2023  FINDINGS: No fracture or dislocation is seen.   No cortical destruction to suggest osteomyelitis.   Hip joint spaces are preserved.   Skin staples overlying the anteromedial right upper thigh and lower inguinal region, related to recent vein graft hardest.   Interconnected subcutaneous fluid collections in the anteromedial right upper thigh, measuring 3.9 x 4.3 x 13.9 cm in aggregate. Mild peripheral enhancement (series 2/image 124), although without well-defined/thickened wall. However, this appearance favors infected seromas versus early/developing abscesses.   Overlying skin thickening with subcutaneous stranding along the medial left upper thigh (series 2/image 106), suggesting cellulitis.   IMPRESSION: Early developing abscesses versus infected seromas in the anteromedial right upper thigh, as described above.   Overlying cellulitis.  Statin:  Yes.   Beta Blocker:  Yes.   Aspirin:  Yes.   ACEI:  No. ARB:  Yes.   CCB use:  No Other antiplatelets/anticoagulants:  Yes.   Plavix 75 mg Daily    ASSESSMENT/PLAN: This is a 82 y.o. male with an early developing abscess or possible infected seroma in the anterior right lower extremity thigh. Note erythema to his right thigh. Patient was placed on Augmentin PO by the vascular Surgery clinic but patient reports worsening symptoms.   PLAN: Vascular Surgery plans on taking the patient to the vascular lab later today for an ultrasound guided needle drainage of the seroma/abscess. Once acquired it will be sent for culture and sensitivity for antibiotic coverage. I discussed in detail with the patient and his  wife the procedure, benefits, risks and complications. Both verbalized there understanding and wish to proceed later today. I asked that the patient not eat lunch but does not need to be NPO for this procedure.    -I discussed the plan with Dr Vilinda Flake MD and he agrees with the plan.    Marcie Bal [facility] 05/03/2023 7:10 AM

## 2023-05-03 NOTE — Progress Notes (Signed)
Pharmacy Antibiotic Note  [name] is a 82 y.o. male admitted on 05/02/2023 with cellulitis.  Pharmacy has been consulted for Vancomycin and Cefepime dosing.  Plan: Continue Cefepime 2 gm IV Q12H for CrCl < 68m/min   Start Vancomycin 1250 mg IV Q 24 hrs. Goal AUC 400-550. Expected AUC: 497 SCr used: 1.12  Pharmacy will continue to monitor and adjust as needed.   Height: 5\' 8"  (172.7 cm) Weight: 77.1 kg (170 lb) IBW/kg (Calculated) : 68.4  Temp (24hrs), Avg:98.5 F (36.9 C), Min:98 F (36.7 C), Max:99.1 F (37.3 C)  Recent Labs  Lab 05/02/23 1458 05/02/23 2205 05/03/23 0613  WBC 12.2*  --  11.7*  CREATININE 1.39*  --  1.12  LATICACIDVEN  --  1.4  --     Estimated Creatinine Clearance: 49.2 mL/min (by C-G formula based on SCr of 1.12 mg/dL).    Allergies  Allergen Reactions   Haemophilus B Polysaccharide Vaccine Nausea And Vomiting and Other (See Comments)    Fever, chills, vomiting.   Influenza Virus Vaccine Other (See Comments) and Nausea And Vomiting    Fever, chills, vomiting.     Antimicrobials this admission: 8/16 ceftriaxone x1 8/16 Cefepime>> 8/16 Vancomycin>>   Microbiology results:  BCx: pending   Thank you for allowing pharmacy to be a part of this patient's care.  Gardner Candle, PharmD, BCPS Clinical Pharmacist 05/03/2023 10:09 AM

## 2023-05-03 NOTE — ED Notes (Signed)
Informed provider that one set of cultures were drawn at this time time.

## 2023-05-03 NOTE — ED Provider Notes (Signed)
Patient received in signout from Dr. Arnoldo Morale pending CT with contrast to evaluate for postoperative seroma, abscess, cellulitis.  On exam he has cellulitis medial to an intact staple line from vascular bypass graft that was done 2 weeks ago.  CT with evidence of a developing abscess as well  With his leukocytosis greater than 12 and intermittent respirations greater than 20 he is meeting SIRS criteria.  Nursing having trouble getting multiple cultures but we do get 1 culture before antibiotics.  He is provided ceftriaxone and vancomycin.  I consult with medicine who agrees to admit.  .Critical Care  Performed by: Delton Prairie, MD Authorized by: Delton Prairie, MD   Critical care provider statement:    Critical care time (minutes):  30   Critical care time was exclusive of:  Separately billable procedures and treating other patients   Critical care was necessary to treat or prevent imminent or life-threatening deterioration of the following conditions:  Sepsis   Critical care was time spent personally by me on the following activities:  Development of treatment plan with patient or surrogate, discussions with consultants, evaluation of patient's response to treatment, examination of patient, ordering and review of laboratory studies, ordering and review of radiographic studies, ordering and performing treatments and interventions, pulse oximetry, re-evaluation of patient's condition and review of old charts       Delton Prairie, MD 05/03/23 [phone]

## 2023-05-03 NOTE — Plan of Care (Signed)
  Problem: Activity: Goal: Ability to tolerate increased activity will improve Outcome: Progressing   Problem: Clinical Measurements: Goal: Postoperative complications will be avoided or minimized Outcome: Progressing Goal: Signs and symptoms of graft occlusion will improve Outcome: Progressing   Problem: Skin Integrity: Goal: Demonstration of wound healing without infection will improve Outcome: Progressing   Problem: Pain Managment: Goal: General experience of comfort will improve Outcome: Progressing   Problem: Safety: Goal: Ability to remain free from injury will improve Outcome: Progressing

## 2023-05-03 NOTE — Progress Notes (Signed)
CODE SEPSIS - PHARMACY COMMUNICATION  **Broad Spectrum Antibiotics should be administered within 1 hour of Sepsis diagnosis**  Time Code Sepsis Called/Page Received: 8/15 @ 2344   Antibiotics Ordered:  ceftriaxone, vancomycin   Time of 1st antibiotic administration: Ceftriaxone 2 gm IV X 1 on 8/16 @ 0053   Additional action taken by pharmacy:   If necessary, Name of Provider/Nurse Contacted:     Nickayla Mcinnis D ,PharmD Clinical Pharmacist  05/03/2023  1:29 AM

## 2023-05-04 LAB — BASIC METABOLIC PANEL
Anion gap: 7 (ref 5–15)
BUN: 19 mg/dL (ref 8–23)
CO2: 21 mmol/L — ABNORMAL LOW (ref 22–32)
Calcium: 8.2 mg/dL — ABNORMAL LOW (ref 8.9–10.3)
Chloride: 107 mmol/L (ref 98–111)
Creatinine, Ser: 1.22 mg/dL (ref 0.61–1.24)
GFR, Estimated: 59 mL/min — ABNORMAL LOW (ref >60)
Glucose, Bld: 96 mg/dL (ref 70–99)
Potassium: 4.2 mmol/L (ref 3.5–5.1)
Sodium: 135 mmol/L (ref 135–145)

## 2023-05-04 LAB — CBC
HCT: 25.5 % — ABNORMAL LOW (ref 39.0–52.0)
Hemoglobin: 8.2 g/dL — ABNORMAL LOW (ref 13.0–17.0)
MCH: 29.8 pg (ref 26.0–34.0)
MCHC: 32.2 g/dL (ref 30.0–36.0)
MCV: 92.7 fL (ref 80.0–100.0)
Platelets: 176 10*3/uL (ref 150–400)
RBC: 2.75 MIL/uL — ABNORMAL LOW (ref 4.22–5.81)
RDW: 13.3 % (ref 11.5–15.5)
WBC: 8.1 10*3/uL (ref 4.0–10.5)
nRBC: 0 % (ref 0.0–0.2)

## 2023-05-04 LAB — GLUCOSE, CAPILLARY
Glucose-Capillary: 90 mg/dL (ref 70–99)
Glucose-Capillary: 128 mg/dL — ABNORMAL HIGH (ref 70–99)
Glucose-Capillary: 138 mg/dL — ABNORMAL HIGH (ref 70–99)
Glucose-Capillary: 114 mg/dL — ABNORMAL HIGH (ref 70–99)

## 2023-05-04 MED ORDER — ASPIRIN 81 MG PO TBEC
81.0000 mg | DELAYED_RELEASE_TABLET | Freq: Every day | ORAL | Status: DC
  Administered 2023-05-04 – 2023-05-05 (×2): 81 mg via ORAL
  Filled 2023-05-04 (×2): qty 1

## 2023-05-04 MED ORDER — CLOPIDOGREL BISULFATE 75 MG PO TABS
75.0000 mg | ORAL_TABLET | Freq: Every day | ORAL | Status: DC
  Administered 2023-05-04 – 2023-05-05 (×2): 75 mg via ORAL
  Filled 2023-05-04 (×2): qty 1

## 2023-05-04 NOTE — Plan of Care (Signed)
  Problem: Education: Goal: Knowledge of prescribed regimen will improve Outcome: Progressing   Problem: Activity: Goal: Ability to tolerate increased activity will improve Outcome: Progressing   Problem: Clinical Measurements: Goal: Postoperative complications will be avoided or minimized Outcome: Progressing   Problem: Clinical Measurements: Goal: Ability to avoid or minimize complications of infection will improve Outcome: Progressing   Problem: Skin Integrity: Goal: Skin integrity will improve Outcome: Progressing   Problem: Coping: Goal: Level of anxiety will decrease Outcome: Progressing

## 2023-05-04 NOTE — Plan of Care (Signed)
  Problem: Education: Goal: Knowledge of prescribed regimen will improve Outcome: Progressing   Problem: Bowel/Gastric: Goal: Gastrointestinal status for postoperative course will improve Outcome: Progressing   Problem: Clinical Measurements: Goal: Postoperative complications will be avoided or minimized Outcome: Progressing Goal: Signs and symptoms of graft occlusion will improve Outcome: Progressing   Problem: Clinical Measurements: Goal: Ability to avoid or minimize complications of infection will improve Outcome: Progressing   Problem: Skin Integrity: Goal: Skin integrity will improve Outcome: Progressing   Problem: Education: Goal: Knowledge of General Education information will improve Description: Including pain rating scale, medication(s)/side effects and non-pharmacologic comfort measures Outcome: Progressing   Problem: Nutrition: Goal: Adequate nutrition will be maintained Outcome: Progressing   Problem: Elimination: Goal: Will not experience complications related to bowel motility Outcome: Progressing Goal: Will not experience complications related to urinary retention Outcome: Progressing   Problem: Pain Managment: Goal: General experience of comfort will improve Outcome: Progressing   Problem: Safety: Goal: Ability to remain free from injury will improve Outcome: Progressing   Problem: Skin Integrity: Goal: Risk for impaired skin integrity will decrease Outcome: Progressing

## 2023-05-04 NOTE — Progress Notes (Signed)
PROGRESS NOTE    [name]  ZOX:[id number] DOB: 1941-04-08 DOA: 05/02/2023 PCP: Wilford Corner, PA-C  Outpatient Specialists: vascular surgery, cardiology    Brief Narrative:   [name] is a 82 y.o. male with medical history significant for HTN, DM, CAD, diastolic CHF, PVD s/p left transmetatarsal amputation, s/p amputation right second toe on 7/30 with prior popliteal-peroneal artery bypass graft on 7/25 to the ED with pain, redness, warmth in the right upper thigh from where the saphenous vein graft was harvested.  He denies fever or chills. ED course and data review: BP 140/61, mild tachypnea but vitals otherwise WNL. Labs:Hemoglobin WBC 12,000 with lactic acid 1.4.  Hemoglobin 10 which is his baseline.  Creatinine 1.39 up from baseline of 1.05, sodium 131. Venous ultrasound negative for DVT but showing a postsurgical fluid collection as follows "16.4 x 3.2 x 5.5 cm postsurgical fluid collection in the right upper  thigh, at the site of recent vein harvest"   Assessment & Plan:   Principal Problem:   Cellulitis of right groin, surgical site Active Problems:   Surgical wound infection   PVD (peripheral vascular disease) (HCC)   Essential hypertension   AKI (acute kidney injury) (HCC)   Chronic kidney disease, stage 3a (HCC)   Chronic diastolic CHF (congestive heart failure) (HCC)   CAD (coronary artery disease)   Diabetes (HCC)  # Surgical site infection Worsening erythema and swelling medial aspect of right upper thigh incision, u/s showing 6x3x5 fluid collection concerning for abscess vs infected seroma vs seroma. Vascular surgery performed needle drainage on 8/16. Pain, swelling, and erythema at that site are improving.  - continue vanc/cefepime for now - vascular surgery to see - follow blood and wound cultures, ngtd - will reach out to vascular for their thoughts regarding whether they think the fluid appeared to be infected - oxy for pain, bowel  regimen  # Recent right 2nd toe amputation Podiatry saw at bedside 8/16, changed dressing - outpt podiatry f/u 8/20  # Normocytic anemia Baseline hgb 11s, 10s after recent surgery, here 8s, no report of bleeding, suspect some element of dilution given ivf - monitor  # PAD - resume plavix - cont home aspirin, rosuvastatin  # HTN Here mild bp elevation - cont home irbesartan, metoprolol  # T2DM Diet controlled - monitor  # CKD 3a with acute kidney injury AKI resolved with IV fluids - hold fluids and monitor  # CAD Seen on coronary CT - cont statin/asa as above    DVT prophylaxis: lovenox Code Status: full Family Communication: wife updated @ bedside 8/17  Level of care: Med-Surg Status is: Inpatient Remains inpatient appropriate because: need for iv abx    Consultants:  vascular  Procedures: Ultrasound guided needle aspiration of right thigh thigh seroma   Antimicrobials:  Vanc/cefepim    Subjective: Reports mild pain right upper thigh and right foot otherwise feeling well  Objective: Vitals:   05/03/23 2125 05/04/23 0137 05/04/23 0513 05/04/23 0830  BP: (!) 118/51 122/63 119/60 (!) 128/55  Pulse: 64 62 (!) 57 67  Resp: 20 18 20 18   Temp: 98.5 F (36.9 C) 98.7 F (37.1 C) 98.6 F (37 C) 98.7 F (37.1 C)  TempSrc:      SpO2: 97% 99% 97% 98%  Weight:      Height:        Intake/Output Summary (Last 24 hours) at 05/04/2023 1246 Last data filed at 05/04/2023 1124 Gross per 24 hour  Intake  636.48 ml  Output 2025 ml  Net -1388.52 ml   Filed Weights   05/03/23 0027 05/03/23 1446  Weight: 77.1 kg 73.9 kg    Examination:  General exam: Appears calm and comfortable  Respiratory system: Clear to auscultation. Respiratory effort normal. Cardiovascular system: S1 & S2 heard, RRR. No JVD, murmurs, rubs, gallops or clicks. No pedal edema. Gastrointestinal system: Abdomen is nondistended, soft and nontender. No organomegaly or masses felt. Normal  bowel sounds heard. Central nervous system: Alert and oriented. No focal neurological deficits. Extremities: Symmetric 5 x 5 power. Skin: induration and erythema medial to right upper thigh incision. Improved from yesterday Psychiatry: Judgement and insight appear normal. Mood & affect appropriate.     Data Reviewed: I have personally reviewed following labs and imaging studies  CBC: Recent Labs  Lab 05/02/23 1458 05/03/23 0613 05/04/23 0617  WBC 12.2* 11.7* 8.1  NEUTROABS 7.8*  --   --   HGB 10.0* 8.4* 8.2*  HCT 31.2* 24.9* 25.5*  MCV 93.7 90.5 92.7  PLT 203 180 176   Basic Metabolic Panel: Recent Labs  Lab 05/02/23 1458 05/03/23 0613 05/04/23 0617  NA 131* 137 135  K 4.4 4.7 4.2  CL 99 107 107  CO2 21* 23 21*  GLUCOSE 115* 115* 96  BUN 27* 24* 19  CREATININE 1.39* 1.12 1.22  CALCIUM 8.7* 8.3* 8.2*   GFR: Estimated Creatinine Clearance: 45.2 mL/min (by C-G formula based on SCr of 1.22 mg/dL). Liver Function Tests: Recent Labs  Lab 05/02/23 1458  AST 24  ALT 31  ALKPHOS 61  BILITOT 0.8  PROT 7.5  ALBUMIN 3.7   No results for input(s): "LIPASE", "AMYLASE" in the last 168 hours. No results for input(s): "AMMONIA" in the last 168 hours. Coagulation Profile: No results for input(s): "INR", "PROTIME" in the last 168 hours. Cardiac Enzymes: No results for input(s): "CKTOTAL", "CKMB", "CKMBINDEX", "TROPONINI" in the last 168 hours. BNP (last 3 results) No results for input(s): "PROBNP" in the last 8760 hours. HbA1C: Recent Labs    05/03/23 0613  HGBA1C 5.6   CBG: Recent Labs  Lab 05/03/23 1200 05/03/23 1647 05/03/23 2210 05/04/23 0733 05/04/23 1124  GLUCAP 203* 81 139* 90 128*   Lipid Profile: No results for input(s): "CHOL", "HDL", "LDLCALC", "TRIG", "CHOLHDL", "LDLDIRECT" in the last 72 hours. Thyroid Function Tests: No results for input(s): "TSH", "T4TOTAL", "FREET4", "T3FREE", "THYROIDAB" in the last 72 hours. Anemia Panel: No results for  input(s): "VITAMINB12", "FOLATE", "FERRITIN", "TIBC", "IRON", "RETICCTPCT" in the last 72 hours. Urine analysis:    Component Value Date/Time   COLORURINE YELLOW (A) 03/19/2023 1518   APPEARANCEUR CLEAR (A) 03/19/2023 1518   LABSPEC 1.009 03/19/2023 1518   PHURINE 5.0 03/19/2023 1518   GLUCOSEU NEGATIVE 03/19/2023 1518   HGBUR NEGATIVE 03/19/2023 1518   BILIRUBINUR NEGATIVE 03/19/2023 1518   KETONESUR NEGATIVE 03/19/2023 1518   PROTEINUR NEGATIVE 03/19/2023 1518   NITRITE NEGATIVE 03/19/2023 1518   LEUKOCYTESUR NEGATIVE 03/19/2023 1518   Sepsis Labs: @LABRCNTIP (procalcitonin:4,lacticidven:4)  ) Recent Results (from the past 240 hour(s))  Blood culture (routine x 2)     Status: None (Preliminary result)   Collection Time: 05/02/23 12:55 AM   Specimen: BLOOD LEFT ARM  Result Value Ref Range Status   Specimen Description BLOOD LEFT ARM  Final   Special Requests   Final    BOTTLES DRAWN AEROBIC AND ANAEROBIC Blood Culture adequate volume   Culture   Final    NO GROWTH 1 DAY Performed at  [facility], [address], [place], [place] [address]    Report Status PENDING  Incomplete  Culture, blood (Routine X 2) w Reflex to ID Panel     Status: None (Preliminary result)   Collection Time: 05/03/23  6:13 AM   Specimen: BLOOD LEFT ARM  Result Value Ref Range Status   Specimen Description BLOOD LEFT ARM  Final   Special Requests   Final    BOTTLES DRAWN AEROBIC AND ANAEROBIC Blood Culture results may not be optimal due to an excessive volume of blood received in culture bottles   Culture   Final    NO GROWTH < 24 HOURS Performed at [facility], [address], [place], [place] [address]    Report Status PENDING  Incomplete  Aerobic/Anaerobic Culture w Gram Stain (surgical/deep wound)     Status: None (Preliminary result)   Collection Time: 05/03/23  5:01 PM   Specimen: Wound; Abscess  Result Value Ref Range Status   Specimen Description   Final     WOUND Performed at [facility], [address] [place], [place] [address]    Special Requests   Final    RL Performed at [facility], [address] [place], [place] [address]    Gram Stain NO WBC SEEN NO ORGANISMS SEEN   Final   Culture   Final    NO GROWTH < 24 HOURS Performed at [facility], [address], [place], [place] [address]    Report Status PENDING  Incomplete         Radiology Studies: PERIPHERAL VASCULAR CATHETERIZATION  Result Date: 05/03/2023 See surgical note for result.  CT FEMUR RIGHT W CONTRAST  Result Date: 05/03/2023 CLINICAL DATA:  Status post vascular bypass, right upper thigh redness/pain/warmth, concern for cellulitis versus abscess EXAM: CT OF THE LOWER RIGHT EXTREMITY WITH CONTRAST TECHNIQUE: Multidetector CT imaging of the lower right extremity was performed according to the standard protocol following intravenous contrast administration. RADIATION DOSE REDUCTION: This exam was performed according to the departmental dose-optimization program which includes automated exposure control, adjustment of the mA and/or kV according to patient size and/or use of iterative reconstruction technique. CONTRAST:  OMNIPAQUE IOHEXOL 300 MG/ML  SOLN COMPARISON:  Right lower extremity venous Doppler ultrasound dated 05/02/2023 FINDINGS: No fracture or dislocation is seen. No cortical destruction to suggest osteomyelitis. Hip joint spaces are preserved. Skin staples overlying the anteromedial right upper thigh and lower inguinal region, related to recent vein graft hardest. Interconnected subcutaneous fluid collections in the anteromedial right upper thigh, measuring 3.9 x 4.3 x 13.9 cm in aggregate. Mild peripheral enhancement (series 2/image 124), although without well-defined/thickened wall. However, this appearance favors infected seromas versus early/developing abscesses. Overlying skin thickening with subcutaneous stranding along the  medial left upper thigh (series 2/image 106), suggesting cellulitis. IMPRESSION: Early developing abscesses versus infected seromas in the anteromedial right upper thigh, as described above. Overlying cellulitis. Electronically Signed   By: Charline Bills M.D.   On: 05/03/2023 00:21   US Venous Img Lower Unilateral Right  Result Date: 05/02/2023 CLINICAL DATA:  Right leg pain, status post bypass graft EXAM: RIGHT LOWER EXTREMITY VENOUS DOPPLER ULTRASOUND TECHNIQUE: Gray-scale sonography with compression, as well as color and duplex ultrasound, were performed to evaluate the deep venous system(s) from the level of the common femoral vein through the popliteal and proximal calf veins. COMPARISON:  None Available. FINDINGS: VENOUS Normal compressibility of the common femoral, superficial femoral, and popliteal veins.  Calf veins are suboptimally visualized. Visualized portions of profunda femoral vein and great saphenous vein unremarkable. No filling defects to suggest DVT on grayscale or color Doppler imaging. Doppler waveforms show normal direction of venous flow, normal respiratory plasticity and response to augmentation. Limited views of the contralateral common femoral vein are unremarkable. OTHER 16.4 x 3.2 x 5.5 cm elongated fluid collection in the right upper thigh, at the site of recent vein harvest, postsurgical. Limitations: none IMPRESSION: Negative for DVT. 16.4 x 3.2 x 5.5 cm postsurgical fluid collection in the right upper thigh, at the site of recent vein harvest. Electronically Signed   By: Charline Bills M.D.   On: 05/02/2023 21:54        Scheduled Meds:  aspirin EC  81 mg Oral Q0600   enoxaparin (LOVENOX) injection  40 mg Subcutaneous Q24H   insulin aspart  0-15 Units Subcutaneous TID WC   insulin aspart  0-5 Units Subcutaneous QHS   irbesartan  75 mg Oral Daily   metoprolol tartrate  12.5 mg Oral BID   polyethylene glycol  17 g Oral Daily   rosuvastatin  10 mg Oral Daily    Continuous Infusions:  ceFEPime (MAXIPIME) IV Stopped (05/04/23 0245)   vancomycin Stopped (05/04/23 0348)     LOS: 1 day     Silvano Bilis, MD [facility]   If 7PM-7AM, please contact night-coverage [email or web address] Password [facility] 05/04/2023, 12:46 PM

## 2023-05-05 DIAGNOSIS — L7634 Postprocedural seroma of skin and subcutaneous tissue following other procedure: Secondary | ICD-10-CM | POA: Insufficient documentation

## 2023-05-05 LAB — GLUCOSE, CAPILLARY
Glucose-Capillary: 80 mg/dL (ref 70–99)
Glucose-Capillary: 201 mg/dL — ABNORMAL HIGH (ref 70–99)

## 2023-05-05 MED ORDER — MAGNESIUM HYDROXIDE 400 MG/5ML PO SUSP
30.0000 mL | Freq: Once | ORAL | Status: AC
  Administered 2023-05-05: 30 mL via ORAL
  Filled 2023-05-05: qty 30

## 2023-05-05 NOTE — Discharge Summary (Signed)
[name]:[id number] DOB: Jun 26, 1941 DOA: 05/02/2023  PCP: Wilford Corner, PA-C  Admit date: 05/02/2023 Discharge date: 05/05/2023  Time spent: 35 minutes  Recommendations for Outpatient Follow-up:  Podiatry f/u 8/20 Vascular surgery f/u 8/23     Discharge Diagnoses:  Principal Problem:   Postoperative seroma of subcutaneous tissue after non-dermatologic procedure Active Problems:   PVD (peripheral vascular disease) (HCC)   Essential hypertension   AKI (acute kidney injury) (HCC)   Chronic kidney disease, stage 3a (HCC)   Chronic diastolic CHF (congestive heart failure) (HCC)   CAD (coronary artery disease)   Diabetes (HCC)   Discharge Condition: improved  Diet recommendation: heart healthy  Filed Weights   05/03/23 0027 05/03/23 1446  Weight: 77.1 kg 73.9 kg    History of present illness:  From admission h and p [name] is a 82 y.o. male with medical history significant for HTN, DM, CAD, diastolic CHF, PVD s/p left transmetatarsal amputation, s/p amputation right second toe on 7/30 with prior popliteal-peroneal artery bypass graft on 7/25 to the ED with pain, redness, warmth in the right upper thigh from where the saphenous vein graft was harvested.  He denies fever or chills. ED course and data review: BP 140/61, mild tachypnea but vitals otherwise WNL. Labs:Hemoglobin WBC 12,000 with lactic acid 1.4.  Hemoglobin 10 which is his baseline.  Creatinine 1.39 up from baseline of 1.05, sodium 131. Venous ultrasound negative for DVT but showing a postsurgical fluid collection as follows "16.4 x 3.2 x 5.5 cm postsurgical fluid collection in the right upper  thigh, at the site of recent vein harvest"    Hospital Course:  Patient with recent vascular bypass graft procedure and amputation ofr right toe presenting with concerns for surgical site infection. Started on broad-spectrum abx. Vascular took to the OR on 8/16 for needle decompression, serous fluid  aspirated. Culture no growth. Discussed with Dr. Myra Gianotti of vascular surgery, he says this is a simple seroma, no indication for abx. Podiatry saw here and changed bandage on toe. Patient will f/u with podiatry on 8/20 as scheduled and with vascular surgery on 8/23 as scheduled. Infection precautions reviewed.   Procedures:  Ultrasound guided needle aspiration of right thigh thigh seroma   Consultations: vascular  Discharge Exam: Vitals:   05/04/23 2323 05/05/23 0749  BP: 133/64 (!) 135/55  Pulse: (!) 57 63  Resp: 20 18  Temp: 99.3 F (37.4 C) 99.5 F (37.5 C)  SpO2: 97% 98%    General exam: Appears calm and comfortable  Respiratory system: Clear to auscultation. Respiratory effort normal. Cardiovascular system: S1 & S2 heard, RRR. No JVD, murmurs, rubs, gallops or clicks. No pedal edema. Gastrointestinal system: Abdomen is nondistended, soft and nontender. No organomegaly or masses felt. Normal bowel sounds heard. Central nervous system: Alert and oriented. No focal neurological deficits. Extremities: Symmetric 5 x 5 power. Skin: induration and erythema medial to right upper thigh incision. Improved from yesterday Psychiatry: Judgement and insight appear normal. Mood & affect appropriate.   Discharge Instructions   Discharge Instructions     Diet - low sodium heart healthy   Complete by: As directed    Increase activity slowly   Complete by: As directed       Allergies as of 05/05/2023       Reactions   Haemophilus B Polysaccharide Vaccine Nausea And Vomiting, Other (See Comments)   Fever, chills, vomiting.   Influenza Virus Vaccine Other (See Comments), Nausea And Vomiting  Fever, chills, vomiting.         Medication List     STOP taking these medications    amoxicillin-clavulanate 500-125 MG tablet Commonly known as: Augmentin       TAKE these medications    acetaminophen 325 MG tablet Commonly known as: TYLENOL Take 2 tablets (650 mg total) by  mouth every 6 (six) hours as needed for mild pain or fever.   aspirin EC 81 MG tablet Take 1 tablet (81 mg total) by mouth daily at 6 (six) AM. Swallow whole.   clopidogrel 75 MG tablet Commonly known as: PLAVIX Take 75 mg by mouth daily.   docusate sodium 100 MG capsule Commonly known as: COLACE Take 1 capsule (100 mg total) by mouth daily.   metoprolol tartrate 25 MG tablet Commonly known as: LOPRESSOR Take tablet TWO hours prior to his cardiac CT scan.   multivitamin tablet Take 1 tablet by mouth 3 (three) times a week.   oxyCODONE-acetaminophen 5-325 MG tablet Commonly known as: PERCOCET/ROXICET Take 1-2 tablets by mouth every 4 (four) hours as needed for moderate pain.   rosuvastatin 10 MG tablet Commonly known as: CRESTOR Take 1 tablet (10 mg total) by mouth daily.   valsartan 80 MG tablet Commonly known as: DIOVAN Take 1 tablet (80 mg total) by mouth daily.       Allergies  Allergen Reactions   Haemophilus B Polysaccharide Vaccine Nausea And Vomiting and Other (See Comments)    Fever, chills, vomiting.   Influenza Virus Vaccine Other (See Comments) and Nausea And Vomiting    Fever, chills, vomiting.     Follow-up Information     Felecia Shelling, DPM Follow up.   Specialty: Podiatry Why: on 8/20 as scheduled Contact information: [address] [place] [address] [phone]         Gilda Crease, Latina Craver, MD Follow up.   Specialties: Vascular Surgery, Cardiology, Radiology, Vascular Surgery Why: on 8/23 as scheduled Contact information: [address] [place] [address] [phone]                  The results of significant diagnostics from this hospitalization (including imaging, microbiology, ancillary and laboratory) are listed below for reference.    Significant Diagnostic Studies: PERIPHERAL VASCULAR CATHETERIZATION  Result Date: 05/03/2023 See surgical note for result.  CT FEMUR RIGHT W  CONTRAST  Result Date: 05/03/2023 CLINICAL DATA:  Status post vascular bypass, right upper thigh redness/pain/warmth, concern for cellulitis versus abscess EXAM: CT OF THE LOWER RIGHT EXTREMITY WITH CONTRAST TECHNIQUE: Multidetector CT imaging of the lower right extremity was performed according to the standard protocol following intravenous contrast administration. RADIATION DOSE REDUCTION: This exam was performed according to the departmental dose-optimization program which includes automated exposure control, adjustment of the mA and/or kV according to patient size and/or use of iterative reconstruction technique. CONTRAST:  OMNIPAQUE IOHEXOL 300 MG/ML  SOLN COMPARISON:  Right lower extremity venous Doppler ultrasound dated 05/02/2023 FINDINGS: No fracture or dislocation is seen. No cortical destruction to suggest osteomyelitis. Hip joint spaces are preserved. Skin staples overlying the anteromedial right upper thigh and lower inguinal region, related to recent vein graft hardest. Interconnected subcutaneous fluid collections in the anteromedial right upper thigh, measuring 3.9 x 4.3 x 13.9 cm in aggregate. Mild peripheral enhancement (series 2/image 124), although without well-defined/thickened wall. However, this appearance favors infected seromas versus early/developing abscesses. Overlying skin thickening with subcutaneous stranding along the medial left upper thigh (  series 2/image 106), suggesting cellulitis. IMPRESSION: Early developing abscesses versus infected seromas in the anteromedial right upper thigh, as described above. Overlying cellulitis. Electronically Signed   By: Charline Bills M.D.   On: 05/03/2023 00:21   US Venous Img Lower Unilateral Right  Result Date: 05/02/2023 CLINICAL DATA:  Right leg pain, status post bypass graft EXAM: RIGHT LOWER EXTREMITY VENOUS DOPPLER ULTRASOUND TECHNIQUE: Gray-scale sonography with compression, as well as color and duplex ultrasound, were  performed to evaluate the deep venous system(s) from the level of the common femoral vein through the popliteal and proximal calf veins. COMPARISON:  None Available. FINDINGS: VENOUS Normal compressibility of the common femoral, superficial femoral, and popliteal veins. Calf veins are suboptimally visualized. Visualized portions of profunda femoral vein and great saphenous vein unremarkable. No filling defects to suggest DVT on grayscale or color Doppler imaging. Doppler waveforms show normal direction of venous flow, normal respiratory plasticity and response to augmentation. Limited views of the contralateral common femoral vein are unremarkable. OTHER 16.4 x 3.2 x 5.5 cm elongated fluid collection in the right upper thigh, at the site of recent vein harvest, postsurgical. Limitations: none IMPRESSION: Negative for DVT. 16.4 x 3.2 x 5.5 cm postsurgical fluid collection in the right upper thigh, at the site of recent vein harvest. Electronically Signed   By: Charline Bills M.D.   On: 05/02/2023 21:54   DG Foot Complete Right  Result Date: 04/16/2023 CLINICAL DATA:  Postop. EXAM: RIGHT FOOT COMPLETE - 3+ VIEW COMPARISON:  Preoperative imaging. FINDINGS: Interval resection of the second toe. The second metatarsal head is smooth. Expected postsurgical changes in the operative bed soft tissues. Prior postsurgical change of the fourth toe. Overlying dressing in place. The exam is otherwise unchanged. IMPRESSION: Interval resection of the second toe. Electronically Signed   By: Narda Rutherford M.D.   On: 04/16/2023 12:23    Microbiology: Recent Results (from the past 240 hour(s))  Blood culture (routine x 2)     Status: None (Preliminary result)   Collection Time: 05/02/23 12:55 AM   Specimen: BLOOD LEFT ARM  Result Value Ref Range Status   Specimen Description BLOOD LEFT ARM  Final   Special Requests   Final    BOTTLES DRAWN AEROBIC AND ANAEROBIC Blood Culture adequate volume   Culture   Final    NO  GROWTH 2 DAYS Performed at [facility], [address], [place], [place] [address]    Report Status PENDING  Incomplete  Culture, blood (Routine X 2) w Reflex to ID Panel     Status: None (Preliminary result)   Collection Time: 05/03/23  6:13 AM   Specimen: BLOOD LEFT ARM  Result Value Ref Range Status   Specimen Description BLOOD LEFT ARM  Final   Special Requests   Final    BOTTLES DRAWN AEROBIC AND ANAEROBIC Blood Culture results may not be optimal due to an excessive volume of blood received in culture bottles   Culture   Final    NO GROWTH 2 DAYS Performed at [facility], [address], [place], [place] [address]    Report Status PENDING  Incomplete  Aerobic/Anaerobic Culture w Gram Stain (surgical/deep wound)     Status: None (Preliminary result)   Collection Time: 05/03/23  5:01 PM   Specimen: Wound; Abscess  Result Value Ref Range Status   Specimen Description   Final    WOUND Performed at [facility], [address] [place] [place], [place] [address]  Special Requests   Final    RL Performed at [facility], [address] [place], [place] [address]    Gram Stain NO WBC SEEN NO ORGANISMS SEEN   Final   Culture   Final    NO GROWTH 2 DAYS Performed at [facility], [address] [place], [place] [address]    Report Status PENDING  Incomplete     Labs: Basic Metabolic Panel: Recent Labs  Lab 05/02/23 1458 05/03/23 0613 05/04/23 0617  NA 131* 137 135  K 4.4 4.7 4.2  CL 99 107 107  CO2 21* 23 21*  GLUCOSE 115* 115* 96  BUN 27* 24* 19  CREATININE 1.39* 1.12 1.22  CALCIUM 8.7* 8.3* 8.2*   Liver Function Tests: Recent Labs  Lab 05/02/23 1458  AST 24  ALT 31  ALKPHOS 61  BILITOT 0.8  PROT 7.5  ALBUMIN 3.7   No results for input(s): "LIPASE", "AMYLASE" in the last 168 hours. No results for input(s): "AMMONIA" in the last 168 hours. CBC: Recent Labs  Lab 05/02/23 1458 05/03/23 0613 05/04/23 0617   WBC 12.2* 11.7* 8.1  NEUTROABS 7.8*  --   --   HGB 10.0* 8.4* 8.2*  HCT 31.2* 24.9* 25.5*  MCV 93.7 90.5 92.7  PLT 203 180 176   Cardiac Enzymes: No results for input(s): "CKTOTAL", "CKMB", "CKMBINDEX", "TROPONINI" in the last 168 hours. BNP: BNP (last 3 results) Recent Labs    03/19/23 1511  BNP 65.1    ProBNP (last 3 results) No results for input(s): "PROBNP" in the last 8760 hours.  CBG: Recent Labs  Lab 05/04/23 0733 05/04/23 1124 05/04/23 1632 05/04/23 2108 05/05/23 0735  GLUCAP 90 128* 138* 114* 80       Signed:  Silvano Bilis MD.  [facility] 05/05/2023, 11:18 AM

## 2023-05-05 NOTE — Plan of Care (Signed)
Pt A+Ox4. Denies pain. Ambulating in room with walker and 1 assist. No s/s infection and remains on IV antibiotics postop.   Problem: Education: Goal: Knowledge of prescribed regimen will improve Outcome: Progressing   Problem: Activity: Goal: Ability to tolerate increased activity will improve Outcome: Progressing   Problem: Skin Integrity: Goal: Demonstration of wound healing without infection will improve Outcome: Progressing   Problem: Education: Goal: Knowledge of General Education information will improve Description: Including pain rating scale, medication(s)/side effects and non-pharmacologic comfort measures Outcome: Progressing   Problem: Pain Managment: Goal: General experience of comfort will improve Outcome: Progressing

## 2023-05-05 NOTE — Plan of Care (Signed)
  Problem: Education: Goal: Knowledge of prescribed regimen will improve Outcome: Adequate for Discharge   Problem: Activity: Goal: Ability to tolerate increased activity will improve Outcome: Adequate for Discharge   Problem: Bowel/Gastric: Goal: Gastrointestinal status for postoperative course will improve Outcome: Adequate for Discharge   Problem: Clinical Measurements: Goal: Postoperative complications will be avoided or minimized Outcome: Adequate for Discharge Goal: Signs and symptoms of graft occlusion will improve Outcome: Adequate for Discharge   Problem: Skin Integrity: Goal: Demonstration of wound healing without infection will improve Outcome: Adequate for Discharge   Problem: Clinical Measurements: Goal: Ability to avoid or minimize complications of infection will improve Outcome: Adequate for Discharge   Problem: Skin Integrity: Goal: Skin integrity will improve Outcome: Adequate for Discharge   Problem: Education: Goal: Knowledge of General Education information will improve Description: Including pain rating scale, medication(s)/side effects and non-pharmacologic comfort measures Outcome: Adequate for Discharge   Problem: Health Behavior/Discharge Planning: Goal: Ability to manage health-related needs will improve Outcome: Adequate for Discharge   Problem: Clinical Measurements: Goal: Ability to maintain clinical measurements within normal limits will improve Outcome: Adequate for Discharge Goal: Will remain free from infection Outcome: Adequate for Discharge Goal: Diagnostic test results will improve Outcome: Adequate for Discharge Goal: Respiratory complications will improve Outcome: Adequate for Discharge Goal: Cardiovascular complication will be avoided Outcome: Adequate for Discharge   Problem: Activity: Goal: Risk for activity intolerance will decrease Outcome: Adequate for Discharge   Problem: Nutrition: Goal: Adequate nutrition will be  maintained Outcome: Adequate for Discharge   Problem: Coping: Goal: Level of anxiety will decrease Outcome: Adequate for Discharge   Problem: Elimination: Goal: Will not experience complications related to bowel motility Outcome: Adequate for Discharge Goal: Will not experience complications related to urinary retention Outcome: Adequate for Discharge   Problem: Pain Managment: Goal: General experience of comfort will improve Outcome: Adequate for Discharge   Problem: Safety: Goal: Ability to remain free from injury will improve Outcome: Adequate for Discharge   Problem: Skin Integrity: Goal: Risk for impaired skin integrity will decrease Outcome: Adequate for Discharge

## 2023-05-06 ENCOUNTER — Telehealth (INDEPENDENT_AMBULATORY_CARE_PROVIDER_SITE_OTHER): Payer: Self-pay

## 2023-05-06 ENCOUNTER — Ambulatory Visit: Payer: Medicare Other

## 2023-05-06 ENCOUNTER — Encounter: Payer: Self-pay | Admitting: Vascular Surgery

## 2023-05-06 LAB — GLUCOSE, CAPILLARY
Glucose-Capillary: 107 mg/dL — ABNORMAL HIGH (ref 70–99)
Glucose-Capillary: 76 mg/dL (ref 70–99)

## 2023-05-07 ENCOUNTER — Ambulatory Visit: Payer: Medicare Other | Admitting: Podiatry

## 2023-05-07 ENCOUNTER — Encounter: Payer: Self-pay | Admitting: Vascular Surgery

## 2023-05-07 ENCOUNTER — Ambulatory Visit (INDEPENDENT_AMBULATORY_CARE_PROVIDER_SITE_OTHER): Payer: Medicare Other

## 2023-05-07 DIAGNOSIS — Z89421 Acquired absence of other right toe(s): Secondary | ICD-10-CM | POA: Diagnosis not present

## 2023-05-07 DIAGNOSIS — Z9889 Other specified postprocedural states: Secondary | ICD-10-CM

## 2023-05-07 MED ORDER — DOXYCYCLINE HYCLATE 100 MG PO TABS: 100.0000 mg | ORAL_TABLET | Freq: Two times a day (BID) | ORAL | 0 refills | Status: DC

## 2023-05-07 NOTE — Progress Notes (Signed)
Chief Complaint  Patient presents with   Routine Post Op    DOS 7.30.2024 right toe amputation      Subjective:  Patient presents today status post right second toe amputation inpatient.  DOS: 04/16/2023.  Patient was admitted for complications arising from prior popliteal peroneal artery bypass graft.  Presenting in the office today for outpatient follow-up regarding the toe amputation.  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   a.) TTE 08/27/2022: Ao sinus 4.1, asc Ao 3.7, ST junction 3.3   Atherosclerotic PVD with intermittent claudication (HCC)    a.) s/p vascular intervention 03/26/2018:  post tibial and peroneal crosser athrectomy, PTA LEFT post tibial, SFA, and popliteal arteries; b.) s/p PTA 03/20/2023: RIGHT SFA and popliteal arteries   Bilateral carotid artery disease (HCC) 08/22/2022   a.) carotid doppler 08/22/2022: <50 LICA, 50-69% RICA   Blurry vision, bilateral    CAD (coronary artery disease) 09/21/2022   a.) cCTA 09/21/2022: 591 (57 %ile for age/sex/race matched control)   Chest pain    Chronic heart failure with preserved ejection fraction (HFpEF) (HCC)    a.) TTE 08/27/2022: EF >55%, aym LVH, mild LAE, triv AR/PR, mild MR/TR. mild AS, Ao root dilatation, G1DD   CKD (chronic kidney disease), stage III (HCC)    Gangrene of toe of right foot (HCC)    a.) RIGHT second toe   Heart murmur    History of bilateral cataract extraction 2020   HLD (hyperlipidemia)    Hypertension    Incomplete right bundle branch block (RBBB)    Long term current use of antithrombotics/antiplatelets    a.) clopidogrel   Neuropathy    Palpitations    T2DM (type 2 diabetes mellitus) (HCC)    Vertigo    Wears dentures    Full upper and lower (loose)    Past Surgical History:  Procedure Laterality Date   AMPUTATION TOE Left 03/28/2018   Procedure: AMPUTATION  TOE/ PARTIAL RAY RESECTION-LEFT 2ND AND 3RD;  Surgeon: Linus Galas, DPM;  Location: [facility];  Service: Podiatry;  Laterality: Left;   AMPUTATION TOE Right 04/16/2023   Procedure: AMPUTATION TOE;  Surgeon: Felecia Shelling, DPM;  Location: [facility];  Service: Orthopedics/Podiatry;  Laterality: Right;   CATARACT EXTRACTION W/PHACO Left 02/10/2019   Procedure: CATARACT EXTRACTION PHACO AND INTRAOCULAR LENS PLACEMENT (IOC)  LEFT DIABETIC;  Surgeon: Nevada Crane, MD;  Location: [facility];  Service: Ophthalmology;  Laterality: Left;  Diabetic - oral meds   CATARACT EXTRACTION W/PHACO Right 04/20/2019   Procedure: CATARACT EXTRACTION PHACO AND INTRAOCULAR LENS PLACEMENT (IOC)  RIGHT DIABETIC;  Surgeon: Nevada Crane, MD;  Location: [facility];  Service: Ophthalmology;  Laterality: Right;   FEMORAL-TIBIAL BYPASS GRAFT Right 04/11/2023   Procedure: BYPASS GRAFT FEMORAL-TIBIAL ARTERY ( POPLITEAL TO PERONEAL);  Surgeon: Renford Dills, MD;  Location: [facility];  Service: Vascular;  Laterality: Right;   HERNIA REPAIR     LOWER EXTREMITY ANGIOGRAPHY Left 03/26/2018   Procedure: Lower Extremity Angiography;  Surgeon: Renford Dills, MD;  Location: [facility];  Service: Cardiovascular;  Laterality: Left;   LOWER EXTREMITY ANGIOGRAPHY Right 03/20/2023   Procedure: Lower Extremity Angiography;  Surgeon: Renford Dills, MD;  Location: [facility];  Service: Cardiovascular;  Laterality: Right;   LOWER EXTREMITY INTERVENTION Right 05/03/2023   Procedure: LOWER EXTREMITY  INTERVENTION;  Surgeon: Renford Dills, MD;  Location: [facility];  Service: Cardiovascular;  Laterality: Right;  With Staple removal   Percutaneous transluminal angioplasty right superficial femoral artery      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   Procedure: TRANSMETATARSAL AMPUTATION;  Surgeon: Recardo Evangelist, DPM;  Location: [facility];  Service: Podiatry;  Laterality:  Left;    Allergies  Allergen Reactions   Haemophilus B Polysaccharide Vaccine Nausea And Vomiting and Other (See Comments)    Fever, chills, vomiting.   Influenza Virus Vaccine Other (See Comments) and Nausea And Vomiting    Fever, chills, vomiting.     Objective/Physical Exam Skin is warm to touch.  Perfusion adequate.  I dehiscence noted along the amputation site with some fibrotic debris at the amputation wound bed.  No malodor.  Slight serous drainage.  No active bleeding.  Radiographic Exam RT foot 05/07/2023:  Interval amputation of the right second toe at the level of the MTP.  Stable.  No erosions that would be concerning for osteomyelitis  Assessment: 1. s/p amputation right second toe inpatient. DOS: 04/16/2023 2.  S/p popliteal-peroneal artery bypass graft.  DOS: 04/11/2023.  Dr. Gilda Crease, [facility]  Plan of Care:  -Patient was evaluated. X-rays reviewed - Sutures removed today - Recommend Betadine wet-to-dry dressings daily -Prescription for doxycycline 100 mg 2 times daily #20 -Continue WBAT surgical shoe -Return to clinic 2 weeks   Felecia Shelling, DPM [facility]  Dr. Felecia Shelling, DPM    [address] [place], [place] [address]                Office [phone]  Fax [phone]

## 2023-05-08 ENCOUNTER — Other Ambulatory Visit (INDEPENDENT_AMBULATORY_CARE_PROVIDER_SITE_OTHER): Payer: Self-pay | Admitting: Vascular Surgery

## 2023-05-08 DIAGNOSIS — Z9889 Other specified postprocedural states: Secondary | ICD-10-CM

## 2023-05-08 LAB — CULTURE, BLOOD (ROUTINE X 2)
Culture: NO GROWTH
Culture: NO GROWTH
Special Requests: ADEQUATE

## 2023-05-08 LAB — AEROBIC/ANAEROBIC CULTURE W GRAM STAIN (SURGICAL/DEEP WOUND)
Culture: NO GROWTH
Gram Stain: NONE SEEN

## 2023-05-09 ENCOUNTER — Ambulatory Visit: Payer: Medicare Other

## 2023-05-09 ENCOUNTER — Telehealth: Payer: Self-pay | Admitting: Podiatry

## 2023-05-09 NOTE — Telephone Encounter (Signed)
Patient's daughter called.  She looked at the wound where the toe was amputated and has some questions.  She saw a bunch of "white stuff" and isn't sure if it's pus or something else.  She may be able to send photos when she goes over there tomorrow to help him again, but she was concerned and wanted to speak with you.  [phone]

## 2023-05-10 ENCOUNTER — Ambulatory Visit (INDEPENDENT_AMBULATORY_CARE_PROVIDER_SITE_OTHER): Payer: Medicare Other

## 2023-05-10 ENCOUNTER — Ambulatory Visit (INDEPENDENT_AMBULATORY_CARE_PROVIDER_SITE_OTHER): Payer: Medicare Other | Admitting: Nurse Practitioner

## 2023-05-10 ENCOUNTER — Encounter (INDEPENDENT_AMBULATORY_CARE_PROVIDER_SITE_OTHER): Payer: Self-pay | Admitting: Nurse Practitioner

## 2023-05-10 VITALS — BP 136/68 | HR 71 | Resp 16

## 2023-05-10 DIAGNOSIS — I739 Peripheral vascular disease, unspecified: Secondary | ICD-10-CM | POA: Diagnosis not present

## 2023-05-10 DIAGNOSIS — Z9889 Other specified postprocedural states: Secondary | ICD-10-CM | POA: Diagnosis not present

## 2023-05-10 DIAGNOSIS — I7025 Atherosclerosis of native arteries of other extremities with ulceration: Secondary | ICD-10-CM

## 2023-05-11 ENCOUNTER — Encounter (INDEPENDENT_AMBULATORY_CARE_PROVIDER_SITE_OTHER): Payer: Self-pay | Admitting: Nurse Practitioner

## 2023-05-11 NOTE — Progress Notes (Unsigned)
Subjective:    Patient ID: [name], male    DOB: 1941-05-17, 82 y.o.   MRN: [id number] Chief Complaint  Patient presents with  . Follow-up    [facility] 3 week with ABI    HPI  Review of Systems  Skin:  Positive for wound.       Objective:   Physical Exam Vitals reviewed.  HENT:     Head: Normocephalic.  Cardiovascular:     Rate and Rhythm: Normal rate.     Pulses: Normal pulses.  Pulmonary:     Effort: Pulmonary effort is normal.  Skin:    General: Skin is warm and dry.  Neurological:     Mental Status: He is alert and oriented to person, place, and time.     Motor: Weakness present.  Psychiatric:        Mood and Affect: Mood normal.        Behavior: Behavior normal.        Thought Content: Thought content normal.        Judgment: Judgment normal.    BP 136/68 (BP Location: Left Arm)   Pulse 71   Resp 16   Past Medical History:  Diagnosis Date  . Aortic stenosis 08/27/2022   a.) TTE 08/27/2022: mild AS (MPG 19.9; AVA 1.1)  . 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 Atherosclerotic PVD with intermittent claudication (HCC)    a.) s/p vascular intervention 03/26/2018:  post tibial and peroneal crosser athrectomy, PTA LEFT post tibial, SFA, and popliteal arteries; b.) s/p PTA 03/20/2023: RIGHT SFA and popliteal arteries  . Bilateral carotid artery disease (HCC) 08/22/2022   a.) carotid doppler 08/22/2022: <50 LICA, 50-69% RICA  . Blurry vision, bilateral   . CAD (coronary artery disease) 09/21/2022   a.) cCTA 09/21/2022: 591 (57 %ile for age/sex/race matched control)  . Chest pain   . Chronic heart failure with preserved ejection fraction (HFpEF) (HCC)    a.) TTE 08/27/2022: EF >55%, aym LVH, mild LAE, triv AR/PR, mild MR/TR. mild AS, Ao root dilatation, G1DD  . CKD (chronic kidney disease), stage III (HCC)   . Gangrene of toe of right foot (HCC)    a.) RIGHT second toe  . Heart murmur    . History of bilateral cataract extraction 2020  . HLD (hyperlipidemia)   . Hypertension   . Incomplete right bundle branch block (RBBB)   . Long term current use of antithrombotics/antiplatelets    a.) clopidogrel  . Neuropathy   . Palpitations   . T2DM (type 2 diabetes mellitus) (HCC)   . Vertigo   . Wears dentures    Full upper and lower (loose)    Social History   Socioeconomic History  . Marital status: Married    Spouse name: Not on file  . Number of children: Not on file  . Years of education: Not on file  . Highest education level: Not on file  Occupational History  . Not on file  Tobacco Use  . Smoking status: Former    Current packs/day: 0.00    Types: Cigarettes    Quit date: 1985    Years since quitting: 39.6  . Smokeless tobacco: Never  Vaping Use  . Vaping status: Never Used  Substance and Sexual Activity  . Alcohol use: Yes    Comment: once a month or once every 2-3 mos  . Drug use: Never  .  Sexual activity: Never  Other Topics Concern  . Not on file  Social History Narrative  . Not on file   Social Determinants of Health   Financial Resource Strain: Low Risk  (01/10/2023)   Received from [facility]   Overall Financial Resource Strain (CARDIA)   . Difficulty of Paying Living Expenses: Not very hard  Food Insecurity: No Food Insecurity (05/03/2023)   Hunger Vital Sign   . Worried About Programme researcher, broadcasting/film/video in the Last Year: Never true   . Ran Out of Food in the Last Year: Never true  Transportation Needs: No Transportation Needs (05/03/2023)   PRAPARE - Transportation   . Lack of Transportation (Medical): No   . Lack of Transportation (Non-Medical): No  Physical Activity: Not on file  Stress: Not on file  Social Connections: Not on file  Intimate Partner Violence: Not At Risk (05/03/2023)   Humiliation, Afraid, Rape, and Kick questionnaire   . Fear of Current or Ex-Partner: No   . Emotionally  Abused: No   . Physically Abused: No   . Sexually Abused: No    Past Surgical History:  Procedure Laterality Date  . AMPUTATION TOE Left 03/28/2018   Procedure: AMPUTATION TOE/ PARTIAL RAY RESECTION-LEFT 2ND AND 3RD;  Surgeon: Linus Galas, DPM;  Location: [facility] OR[facility];  Service: Podiatry;  Laterality: Left;  . AMPUTATION TOE Right 04/16/2023   Procedure: AMPUTATION TOE;  Surgeon: Felecia Shelling, DPM;  Location: [facility];  Service: Orthopedics/Podiatry;  Laterality: Right;  . CATARACT EXTRACTION W/PHACO Left 02/10/2019   Procedure: CATARACT EXTRACTION PHACO AND INTRAOCULAR LENS PLACEMENT (IOC)  LEFT DIABETIC;  Surgeon: Nevada Crane, MD;  Location: [facility];  Service: Ophthalmology;  Laterality: Left;  Diabetic - oral meds  . CATARACT EXTRACTION W/PHACO Right 04/20/2019   Procedure: CATARACT EXTRACTION PHACO AND INTRAOCULAR LENS PLACEMENT (IOC)  RIGHT DIABETIC;  Surgeon: Nevada Crane, MD;  Location: [facility];  Service: Ophthalmology;  Laterality: Right;  . FEMORAL-TIBIAL BYPASS GRAFT Right 04/11/2023   Procedure: BYPASS GRAFT FEMORAL-TIBIAL ARTERY ( POPLITEAL TO PERONEAL);  Surgeon: Renford Dills, MD;  Location: [facility];  Service: Vascular;  Laterality: Right;  . HERNIA REPAIR    . LOWER EXTREMITY ANGIOGRAPHY Left 03/26/2018   Procedure: Lower Extremity Angiography;  Surgeon: Renford Dills, MD;  Location: [facility];  Service: Cardiovascular;  Laterality: Left;  . LOWER EXTREMITY ANGIOGRAPHY Right 03/20/2023   Procedure: Lower Extremity Angiography;  Surgeon: Renford Dills, MD;  Location: [facility] INV[facility];  Service: Cardiovascular;  Laterality: Right;  . LOWER EXTREMITY INTERVENTION Right 05/03/2023   Procedure: LOWER EXTREMITY INTERVENTION;  Surgeon: Renford Dills, MD;  Location: [facility];  Service: Cardiovascular;  Laterality: Right;  With Staple removal  . Percutaneous transluminal angioplasty right superficial femoral  artery     . 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 Diabetes Mother   . Cancer Father   . Hypertension Maternal Grandfather   . Heart attack Maternal Grandfather     Allergies  Allergen Reactions  . Haemophilus B Polysaccharide Vaccine Nausea And Vomiting and Other (See Comments)    Fever, chills, vomiting.  . Influenza Virus Vaccine Other (See Comments) and Nausea And Vomiting    Fever, chills, vomiting.  Latest Ref Rng & Units 05/04/2023    6:17 AM 05/03/2023    6:13 AM 05/02/2023    2:58 PM  CBC  WBC 4.0 - 10.5 K/uL 8.1  11.7  12.2   Hemoglobin 13.0 - 17.0 g/dL 8.2  8.4  19.1   Hematocrit 39.0 - 52.0 % 25.5  24.9  31.2   Platelets 150 - 400 K/uL 176  180  203       CMP     Component Value Date/Time   NA 135 05/04/2023 0617   K 4.2 05/04/2023 0617   CL 107 05/04/2023 0617   CO2 21 (L) 05/04/2023 0617   GLUCOSE 96 05/04/2023 0617   BUN 19 05/04/2023 0617   CREATININE 1.22 05/04/2023 0617   CALCIUM 8.2 (L) 05/04/2023 0617   PROT 7.5 05/02/2023 1458   ALBUMIN 3.7 05/02/2023 1458   AST 24 05/02/2023 1458   ALT 31 05/02/2023 1458   ALKPHOS 61 05/02/2023 1458   BILITOT 0.8 05/02/2023 1458   GFRNONAA 59 (L) 05/04/2023 0617     No results found.     Assessment & Plan:   1. Atherosclerosis of native arteries of the extremities with ulceration (HCC) Based on noninvasive studies today appears that the patient would have possible marginal ability for wound healing.  However in order to best assess his bypass, we will have her return with an arterial duplex in order to ensure that it has maintained its patency.  Will have her return in 2 to 3 weeks.  Will also resend his referral to physical therapy so that he may restart following his recent hospitalization. - Ambulatory  referral to Physical Therapy   Current Outpatient Medications on File Prior to Visit  Medication Sig Dispense Refill  . acetaminophen (TYLENOL) 325 MG tablet Take 2 tablets (650 mg total) by mouth every 6 (six) hours as needed for mild pain or fever. 20 tablet 0  . aspirin EC 81 MG tablet Take 1 tablet (81 mg total) by mouth daily at 6 (six) AM. Swallow whole. 30 tablet 12  . clopidogrel (PLAVIX) 75 MG tablet Take 75 mg by mouth daily.    Marland Kitchen docusate sodium (COLACE) 100 MG capsule Take 1 capsule (100 mg total) by mouth daily. 10 capsule 0  . doxycycline (VIBRA-TABS) 100 MG tablet Take 1 tablet (100 mg total) by mouth 2 (two) times daily. 20 tablet 0  . metoprolol tartrate (LOPRESSOR) 25 MG tablet Take tablet TWO hours prior to his cardiac CT scan. 30 tablet 0  . Multiple Vitamin (MULTIVITAMIN) tablet Take 1 tablet by mouth 3 (three) times a week.    . rosuvastatin (CRESTOR) 10 MG tablet Take 1 tablet (10 mg total) by mouth daily. 30 tablet 11  . valsartan (DIOVAN) 80 MG tablet Take 1 tablet (80 mg total) by mouth daily. 30 tablet 0  . oxyCODONE-acetaminophen (PERCOCET/ROXICET) 5-325 MG tablet Take 1-2 tablets by mouth every 4 (four) hours as needed for moderate pain. (Patient not taking: Reported on 05/07/2023) 30 tablet 0   No current facility-administered medications on file prior to visit.    There are no Patient Instructions on file for this visit. No follow-ups on file.   Georgiana Spinner, NP

## 2023-05-13 ENCOUNTER — Encounter: Payer: Self-pay | Admitting: Physical Therapy

## 2023-05-13 ENCOUNTER — Encounter: Payer: Self-pay | Admitting: Podiatry

## 2023-05-13 ENCOUNTER — Ambulatory Visit: Payer: Medicare Other | Admitting: Physical Therapy

## 2023-05-13 ENCOUNTER — Encounter: Payer: Medicare Other | Admitting: Podiatry

## 2023-05-13 DIAGNOSIS — R2681 Unsteadiness on feet: Secondary | ICD-10-CM

## 2023-05-13 DIAGNOSIS — R262 Difficulty in walking, not elsewhere classified: Secondary | ICD-10-CM | POA: Diagnosis present

## 2023-05-13 DIAGNOSIS — M6281 Muscle weakness (generalized): Secondary | ICD-10-CM | POA: Diagnosis present

## 2023-05-13 DIAGNOSIS — I7025 Atherosclerosis of native arteries of other extremities with ulceration: Secondary | ICD-10-CM | POA: Diagnosis not present

## 2023-05-13 LAB — VAS US ABI WITH/WO TBI
Left ABI: 0.67
Right ABI: 0.61

## 2023-05-13 NOTE — Therapy (Signed)
OUTPATIENT PHYSICAL THERAPY LOWER EXTREMITY EVALUATION   Patient Name: [name] MRN: [id number] DOB:[birth year], 82 y.o., male Today's Date: 05/13/2023  END OF SESSION:  PT End of Session - 05/13/23 1445     Visit Number 1    Number of Visits 16    Date for PT Re-Evaluation 07/08/23    Authorization Type [employer] Medicare    Authorization Time Period 04/29/23-07/22/23    Progress Note Due on Visit 10    PT Start Time 1447    PT Stop Time 1530    PT Time Calculation (min) 43 min    Activity Tolerance Patient tolerated treatment well;No increased pain    Behavior During Therapy [facility] for tasks assessed/performed             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   a.) TTE 08/27/2022: Ao sinus 4.1, asc Ao 3.7, ST junction 3.3   Atherosclerotic PVD with intermittent claudication (HCC)    a.) s/p vascular intervention 03/26/2018:  post tibial and peroneal crosser athrectomy, PTA LEFT post tibial, SFA, and popliteal arteries; b.) s/p PTA 03/20/2023: RIGHT SFA and popliteal arteries   Bilateral carotid artery disease (HCC) 08/22/2022   a.) carotid doppler 08/22/2022: <50 LICA, 50-69% RICA   Blurry vision, bilateral    CAD (coronary artery disease) 09/21/2022   a.) cCTA 09/21/2022: 591 (57 %ile for age/sex/race matched control)   Chest pain    Chronic heart failure with preserved ejection fraction (HFpEF) (HCC)    a.) TTE 08/27/2022: EF >55%, aym LVH, mild LAE, triv AR/PR, mild MR/TR. mild AS, Ao root dilatation, G1DD   CKD (chronic kidney disease), stage III (HCC)    Gangrene of toe of right foot (HCC)    a.) RIGHT second toe   Heart murmur    History of bilateral cataract extraction 2020   HLD (hyperlipidemia)    Hypertension    Incomplete right bundle branch block (RBBB)    Long term current use of antithrombotics/antiplatelets    a.) clopidogrel    Neuropathy    Palpitations    T2DM (type 2 diabetes mellitus) (HCC)    Vertigo    Wears dentures    Full upper and lower (loose)   Past Surgical History:  Procedure Laterality Date   AMPUTATION TOE Left 03/28/2018   Procedure: AMPUTATION TOE/ PARTIAL RAY RESECTION-LEFT 2ND AND 3RD;  Surgeon: Linus Galas, DPM;  Location: [facility];  Service: Podiatry;  Laterality: Left;   AMPUTATION TOE Right 04/16/2023   Procedure: AMPUTATION TOE;  Surgeon: Felecia Shelling, DPM;  Location: [facility];  Service: Orthopedics/Podiatry;  Laterality: Right;   CATARACT EXTRACTION W/PHACO Left 02/10/2019   Procedure: CATARACT EXTRACTION PHACO AND INTRAOCULAR LENS PLACEMENT (IOC)  LEFT DIABETIC;  Surgeon: Nevada Crane, MD;  Location: [facility];  Service: Ophthalmology;  Laterality: Left;  Diabetic - oral meds   CATARACT EXTRACTION W/PHACO Right 04/20/2019   Procedure: CATARACT EXTRACTION PHACO AND INTRAOCULAR LENS PLACEMENT (IOC)  RIGHT DIABETIC;  Surgeon: Nevada Crane, MD;  Location: [facility];  Service: Ophthalmology;  Laterality: Right;   FEMORAL-TIBIAL BYPASS GRAFT Right 04/11/2023   Procedure: BYPASS GRAFT FEMORAL-TIBIAL ARTERY ( POPLITEAL TO PERONEAL);  Surgeon: Renford Dills, MD;  Location: [facility];  Service: Vascular;  Laterality: Right;   HERNIA REPAIR     LOWER EXTREMITY ANGIOGRAPHY Left 03/26/2018  Procedure: Lower Extremity Angiography;  Surgeon: Renford Dills, MD;  Location: [facility];  Service: Cardiovascular;  Laterality: Left;   LOWER EXTREMITY ANGIOGRAPHY Right 03/20/2023   Procedure: Lower Extremity Angiography;  Surgeon: Renford Dills, MD;  Location: [facility];  Service: Cardiovascular;  Laterality: Right;   LOWER EXTREMITY INTERVENTION Right 05/03/2023   Procedure: LOWER EXTREMITY INTERVENTION;  Surgeon: Renford Dills, MD;  Location: [facility];  Service: Cardiovascular;  Laterality: Right;  With Staple removal    Percutaneous transluminal angioplasty right superficial femoral artery      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   Procedure: TRANSMETATARSAL AMPUTATION;  Surgeon: Recardo Evangelist, DPM;  Location: [facility];  Service: Podiatry;  Laterality: Left;   Patient Active Problem List   Diagnosis Date Noted   Postoperative seroma of subcutaneous tissue after non-dermatologic procedure 05/05/2023   AKI (acute kidney injury) (HCC) 05/03/2023   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) 04/11/2023   Chronic kidney disease, stage 3a (HCC) 03/19/2023   Chronic diastolic CHF (congestive heart failure) (HCC) 03/19/2023   PVD (peripheral vascular disease) (HCC) 03/19/2023   CAD (coronary artery disease) 03/19/2023   Gangrene of toe of right foot (HCC) 03/19/2023   Statin declined 05/18/2022   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   Atherosclerosis of native arteries of the extremities with ulceration (HCC) 04/28/2018   Diabetes (HCC) 04/28/2018   Essential hypertension 04/28/2018   Diabetic foot infection (HCC) 03/24/2018    PCP: Wilford Corner, PA-C   REFERRING PROVIDER: Georgiana Spinner, NP   REFERRING DIAG: I70.25 (ICD-10-CM) - Atherosclerosis of native arteries of the extremities with ulceration (HCC)   THERAPY DIAG:  Unsteadiness on feet  Difficulty in walking, not elsewhere classified  Rationale for Evaluation and Treatment: Rehabilitation  ONSET DATE: 04/11/2023 for Bypass graft and Toe amputation on 04/16/2023  SUBJECTIVE:   SUBJECTIVE STATEMENT: Pt reports that he has been struggling with walking and getting up, but reports that his strength has been coming back since most recent hospitalization.   PERTINENT HISTORY:  [name] is an 82yoM who is referred to OPPT for balance issues in the setting of recent hopsital admission, Rt PopPer bypass RLE 04/11/23 and Rt 2nd toe amputation 04/16/23. Pt was DC to home with RW (unable to use due to space) and surgical  shoe (wrong shoe but podiatry says 'probably fine') WBAT. Pt is using SPC for AMB out of house, furniture for in-home AMB, has not returned to AMB over soft surfaces (feeding his chickens). Pt has not had any falls in recent months, however has remote falls. Pt formerly limited also by visual impairments 2/2 cataracts.    PAIN:  Are you having pain? Yes: NPRS scale: 1/10 Pain location: R foot  Pain description: soreness,  Aggravating factors: laying down  Relieving factors: walking   Reports pain gets up to 5/10   PRECAUTIONS: Other: recent toe amputation. -Continue WBAT surgical shoe   RED FLAGS: None   WEIGHT BEARING RESTRICTIONS: No  FALLS:  Has patient fallen in last 6 months? No  LIVING ENVIRONMENT: Lives with: lives with their spouse Wife has health problems with back and "everything else" Lives in: Mobile home Stairs: Yes: External: 1 steps; none Has following equipment at home: Single point cane and Walker - 2 wheeled  OCCUPATION: retured   PLOF: Independent and reports that Son has to help   PATIENT GOALS: improve safety with walking and strength in BLE so he can get out  and feed the chickens.   NEXT MD VISIT: Dr Logan Bores 9/3   OBJECTIVE:   DIAGNOSTIC FINDINGS:   EXAM: 8/15 CT OF THE LOWER RIGHT EXTREMITY WITH CONTRAST IMPRESSION: Early developing abscesses versus infected seromas in the anteromedial right upper thigh, as described above.  8/20 Radiographic Exam RT foot 05/07/2023:  Interval amputation of the right second toe at the level of the MTP.  Stable.  No erosions that would be concerning for osteomyelitis   PATIENT SURVEYS:  FOTO 53  COGNITION: Overall cognitive status: Within functional limits for tasks assessed     SENSATION: Light touch: Impaired      POSTURE: rounded shoulders and forward head  PALPATION: No tenderness reported to palpation on this day.   LOWER EXTREMITY ROM:  Grossly WFL with mild decreased ankle DF in  BLE  LOWER EXTREMITY MMT:  MMT Right eval Left eval  Hip flexion 4- 4  Hip extension 4 4  Hip abduction 4 4  Hip adduction 4+ 4+  Hip internal rotation    Hip external rotation    Knee flexion 4+ 4+  Knee extension 5 5  Ankle dorsiflexion At least 3 At least 3   Ankle plantarflexion At least 3 At least 3   Ankle inversion    Ankle eversion     (Blank rows = not tested)    FUNCTIONAL TESTS:  5 times sit to stand: 24.5sec Timed up and go (TUG): 30.33 10 meter walk test: 19.52sec (0.28m/s) Berg Balance Scale: 39  GAIT: Distance walked: 70 Assistive device utilized: None Level of assistance: SBA Comments: mild antalgia in the RLE   TODAY'S TREATMENT:                                                                                                                              DATE: 05/13/2023   Evaluation since recent hospital admission.    PATIENT EDUCATION:  Education details: POC, Clinic re-orientation Person educated: Patient and Child(ren) Education method: Explanation Education comprehension: verbalized understanding  HOME EXERCISE PROGRAM: To be give at next session.   ASSESSMENT:  CLINICAL IMPRESSION: Patient is an 82 y.o. male who was seen today for physical therapy evaluation and treatment for strength and balance deficits from recent bypass graft and toe amputaiton. Pt demonstrates increased fall risk and reduced safety with decreased gait speed and score of 39 on Berg as well as increased time on TUG of 30 sec and 5xSTS of 24.5. Pt demonstrates mild BLE weakness with hip flexion on the RLE as greatest limitation.  Pt will benefit from skilled PT to address strength and balance deficits to allow improved QoL and return to PLOF.   OBJECTIVE IMPAIRMENTS: Abnormal gait, decreased activity tolerance, decreased balance, decreased endurance, decreased knowledge of condition, decreased knowledge of use of DME, decreased mobility, difficulty walking, decreased  strength, impaired perceived functional ability, and impaired sensation.   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   REHAB POTENTIAL: Good  CLINICAL DECISION MAKING: Stable/uncomplicated  EVALUATION COMPLEXITY: Moderate   GOALS: Goals reviewed with patient? Yes  SHORT TERM GOALS: Target date: 06/17/2023    Patient will be independent in home exercise program to improve strength/mobility for better functional independence with ADLs. Baseline: to be give at next visit Goal status: INITIAL   LONG TERM GOALS: Target date: 07/08/2023    Patient will increase FOTO score to equal to or greater than  62   to demonstrate statistically significant improvement in mobility and quality of life.  Baseline: 53 Goal status: INITIAL  2.  Patient (> 28 years old) will complete five times sit to stand test in < 15 seconds indicating an increased LE strength and improved balance. Baseline: 24.5 Goal status: INITIAL  3.  Patient will increase Berg Balance score by > 6 points to demonstrate decreased fall risk during functional activities Baseline: 39 Goal status: INITIAL  4.  Patient will increase 10 meter walk test to >.61m/s as to improve gait speed for better community ambulation and to reduce fall risk. Baseline: 0.51 Goal status: INITIAL  5.  Patient will reduce timed up and go to <15 seconds to reduce fall risk and demonstrate improved transfer/gait ability. Baseline: 30.3sec Goal status: INITIAL  6.  Patient will self report ability to feed chickens to indicate improve return to PLOF and reduced dependence on family for household chores  Baseline: unable to perform Goal status: INITIAL    PLAN:  PT FREQUENCY:  1-2x/week  PT DURATION: 8 weeks  PLANNED INTERVENTIONS: Therapeutic exercises, Therapeutic activity, Neuromuscular re-education, Balance training, Gait training, Patient/Family education, Self Care, Joint mobilization, Stair training, Visual/preceptual remediation/compensation, Orthotic/Fit training, DME instructions, Moist heat, Compression bandaging, scar mobilization, Splintting, Taping, and Manual therapy  PLAN FOR NEXT SESSION:   provide HEP and endurance training.    Grier Rocher PT, DPT  Physical Therapist - [place]  [facility]  4:12 PM 05/13/23

## 2023-05-14 ENCOUNTER — Other Ambulatory Visit: Payer: Self-pay | Admitting: Podiatry

## 2023-05-14 MED ORDER — CIPROFLOXACIN HCL 500 MG PO TABS: 500.0000 mg | ORAL_TABLET | Freq: Two times a day (BID) | ORAL | 0 refills | Status: AC

## 2023-05-14 NOTE — Progress Notes (Signed)
Rx Cipro based on pics taken and submitted by patient's daughter.  Continue Betadine daily to the wound.- Dr. Logan Bores

## 2023-05-15 ENCOUNTER — Encounter: Payer: Self-pay | Admitting: Podiatry

## 2023-05-16 ENCOUNTER — Ambulatory Visit: Payer: Medicare Other

## 2023-05-16 DIAGNOSIS — R2681 Unsteadiness on feet: Secondary | ICD-10-CM | POA: Diagnosis not present

## 2023-05-16 DIAGNOSIS — M6281 Muscle weakness (generalized): Secondary | ICD-10-CM

## 2023-05-16 DIAGNOSIS — R262 Difficulty in walking, not elsewhere classified: Secondary | ICD-10-CM

## 2023-05-16 NOTE — Therapy (Signed)
OUTPATIENT PHYSICAL THERAPY LOWER EXTREMITY TREATMENT NOTE   Patient Name: [name] MRN: [id number] DOB:05-Jul-1941, 82 y.o., male Today's Date: 05/16/2023  END OF SESSION:  PT End of Session - 05/16/23 1315     Visit Number 2    Number of Visits 16    Date for PT Re-Evaluation 07/08/23    Authorization Type [employer]    Authorization Time Period 04/29/23-07/22/23    Progress Note Due on Visit 10    PT Start Time 1317    PT Stop Time 1400    PT Time Calculation (min) 43 min    Equipment Utilized During Treatment Gait belt    Activity Tolerance Patient tolerated treatment well;No increased pain    Behavior During Therapy [facility] for tasks assessed/performed              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   a.) TTE 08/27/2022: Ao sinus 4.1, asc Ao 3.7, ST junction 3.3   Atherosclerotic PVD with intermittent claudication (HCC)    a.) s/p vascular intervention 03/26/2018:  post tibial and peroneal crosser athrectomy, PTA LEFT post tibial, SFA, and popliteal arteries; b.) s/p PTA 03/20/2023: RIGHT SFA and popliteal arteries   Bilateral carotid artery disease (HCC) 08/22/2022   a.) carotid doppler 08/22/2022: <50 LICA, 50-69% RICA   Blurry vision, bilateral    CAD (coronary artery disease) 09/21/2022   a.) cCTA 09/21/2022: 591 (57 %ile for age/sex/race matched control)   Chest pain    Chronic heart failure with preserved ejection fraction (HFpEF) (HCC)    a.) TTE 08/27/2022: EF >55%, aym LVH, mild LAE, triv AR/PR, mild MR/TR. mild AS, Ao root dilatation, G1DD   CKD (chronic kidney disease), stage III (HCC)    Gangrene of toe of right foot (HCC)    a.) RIGHT second toe   Heart murmur    History of bilateral cataract extraction 2020   HLD (hyperlipidemia)    Hypertension    Incomplete right bundle branch block (RBBB)    Long term current use  of antithrombotics/antiplatelets    a.) clopidogrel   Neuropathy    Palpitations    T2DM (type 2 diabetes mellitus) (HCC)    Vertigo    Wears dentures    Full upper and lower (loose)   Past Surgical History:  Procedure Laterality Date   AMPUTATION TOE Left 03/28/2018   Procedure: AMPUTATION TOE/ PARTIAL RAY RESECTION-LEFT 2ND AND 3RD;  Surgeon: Linus Galas, DPM;  Location: [facility];  Service: Podiatry;  Laterality: Left;   AMPUTATION TOE Right 04/16/2023   Procedure: AMPUTATION TOE;  Surgeon: Felecia Shelling, DPM;  Location: [facility];  Service: Orthopedics/Podiatry;  Laterality: Right;   CATARACT EXTRACTION W/PHACO Left 02/10/2019   Procedure: CATARACT EXTRACTION PHACO AND INTRAOCULAR LENS PLACEMENT (IOC)  LEFT DIABETIC;  Surgeon: Nevada Crane, MD;  Location: [facility];  Service: Ophthalmology;  Laterality: Left;  Diabetic - oral meds   CATARACT EXTRACTION W/PHACO Right 04/20/2019   Procedure: CATARACT EXTRACTION PHACO AND INTRAOCULAR LENS PLACEMENT (IOC)  RIGHT DIABETIC;  Surgeon: Nevada Crane, MD;  Location: [facility];  Service: Ophthalmology;  Laterality: Right;   FEMORAL-TIBIAL BYPASS GRAFT Right 04/11/2023   Procedure: BYPASS GRAFT FEMORAL-TIBIAL ARTERY ( POPLITEAL TO PERONEAL);  Surgeon: Renford Dills, MD;  Location: [facility];  Service: Vascular;  Laterality: Right;   HERNIA  REPAIR     LOWER EXTREMITY ANGIOGRAPHY Left 03/26/2018   Procedure: Lower Extremity Angiography;  Surgeon: Renford Dills, MD;  Location: [facility];  Service: Cardiovascular;  Laterality: Left;   LOWER EXTREMITY ANGIOGRAPHY Right 03/20/2023   Procedure: Lower Extremity Angiography;  Surgeon: Renford Dills, MD;  Location: [facility];  Service: Cardiovascular;  Laterality: Right;   LOWER EXTREMITY INTERVENTION Right 05/03/2023   Procedure: LOWER EXTREMITY INTERVENTION;  Surgeon: Renford Dills, MD;  Location: [facility];  Service:  Cardiovascular;  Laterality: Right;  With Staple removal   Percutaneous transluminal angioplasty right superficial femoral artery      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   Procedure: TRANSMETATARSAL AMPUTATION;  Surgeon: Recardo Evangelist, DPM;  Location: [facility];  Service: Podiatry;  Laterality: Left;   Patient Active Problem List   Diagnosis Date Noted   Postoperative seroma of subcutaneous tissue after non-dermatologic procedure 05/05/2023   AKI (acute kidney injury) (HCC) 05/03/2023   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) 04/11/2023   Chronic kidney disease, stage 3a (HCC) 03/19/2023   Chronic diastolic CHF (congestive heart failure) (HCC) 03/19/2023   PVD (peripheral vascular disease) (HCC) 03/19/2023   CAD (coronary artery disease) 03/19/2023   Gangrene of toe of right foot (HCC) 03/19/2023   Statin declined 05/18/2022   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   Atherosclerosis of native arteries of the extremities with ulceration (HCC) 04/28/2018   Diabetes (HCC) 04/28/2018   Essential hypertension 04/28/2018   Diabetic foot infection (HCC) 03/24/2018    PCP: Wilford Corner, PA-C   REFERRING PROVIDER: Georgiana Spinner, NP   REFERRING DIAG: I70.25 (ICD-10-CM) - Atherosclerosis of native arteries of the extremities with ulceration (HCC)   THERAPY DIAG:  Muscle weakness (generalized)  Difficulty in walking, not elsewhere classified  Unsteadiness on feet  Rationale for Evaluation and Treatment: Rehabilitation  ONSET DATE: 04/11/2023 for Bypass graft and Toe amputation on 04/16/2023  SUBJECTIVE:   SUBJECTIVE STATEMENT: PT ambulating with SPC to session with boot on. Pt reports 1/10 pain in R toe. He doesn't know if his infection is "healing up right or not." He's been changing dressing daily, has follow-up with his doctor next week. Pt thinks he can walk about half a mile without pain increase. Pt reports no other new concerns and no new  restrictions. His DIL is present for visit.  PERTINENT HISTORY:  [name] is an 82yoM who is referred to OPPT for balance issues in the setting of recent hopsital admission, Rt PopPer bypass RLE 04/11/23 and Rt 2nd toe amputation 04/16/23. Pt was DC to home with RW (unable to use due to space) and surgical shoe (wrong shoe but podiatry says 'probably fine') WBAT. Pt is using SPC for AMB out of house, furniture for in-home AMB, has not returned to AMB over soft surfaces (feeding his chickens). Pt has not had any falls in recent months, however has remote falls. Pt formerly limited also by visual impairments 2/2 cataracts.    PAIN:  Are you having pain? Yes: NPRS scale: 1/10 Pain location: R foot  Pain description: soreness,  Aggravating factors: laying down  Relieving factors: walking   Reports pain gets up to 5/10   PRECAUTIONS: Other: recent toe amputation. -Continue WBAT surgical shoe   RED FLAGS: None   WEIGHT BEARING RESTRICTIONS: No  FALLS:  Has patient fallen in last 6 months? No  LIVING ENVIRONMENT: Lives with: lives with their spouse Wife has health problems with  back and "everything else" Lives in: Mobile home Stairs: Yes: External: 1 steps; none Has following equipment at home: Single point cane and Walker - 2 wheeled  OCCUPATION: retured   PLOF: Independent and reports that Son has to help   PATIENT GOALS: improve safety with walking and strength in BLE so he can get out and feed the chickens.   NEXT MD VISIT: Dr Logan Bores 9/3   OBJECTIVE:   DIAGNOSTIC FINDINGS:   EXAM: 8/15 CT OF THE LOWER RIGHT EXTREMITY WITH CONTRAST IMPRESSION: Early developing abscesses versus infected seromas in the anteromedial right upper thigh, as described above.  8/20 Radiographic Exam RT foot 05/07/2023:  Interval amputation of the right second toe at the level of the MTP.  Stable.  No erosions that would be concerning for osteomyelitis   PATIENT SURVEYS:  FOTO  53  COGNITION: Overall cognitive status: Within functional limits for tasks assessed     SENSATION: Light touch: Impaired      POSTURE: rounded shoulders and forward head  PALPATION: No tenderness reported to palpation on this day.   LOWER EXTREMITY ROM:  Grossly WFL with mild decreased ankle DF in BLE  LOWER EXTREMITY MMT:  MMT Right eval Left eval  Hip flexion 4- 4  Hip extension 4 4  Hip abduction 4 4  Hip adduction 4+ 4+  Hip internal rotation    Hip external rotation    Knee flexion 4+ 4+  Knee extension 5 5  Ankle dorsiflexion At least 3 At least 3   Ankle plantarflexion At least 3 At least 3   Ankle inversion    Ankle eversion     (Blank rows = not tested)    FUNCTIONAL TESTS:  5 times sit to stand: 24.5sec Timed up and go (TUG): 30.33 10 meter walk test: 19.52sec (0.66m/s) Berg Balance Scale: 39  GAIT: Distance walked: 70 Assistive device utilized: None Level of assistance: SBA Comments: mild antalgia in the RLE   TODAY'S TREATMENT:                                                                                                                              DATE: 05/16/2023  TE: Review of HEP: PT instructs pt to only perform if pain-free and not irritating skin/wound sites, discontinue interventions at home if causing irritating to incision/healing areas of skin   Seated LAQ 3x10 each LE Seated march 3x10 each LE Seated heel raise in comfortable range 3x10 each LE STS with close CGA 3x10 (instructed to perform at support surface at home) Comments: pt generally rates interventions medium-hard. Cued to perform in pain-free ranges and at pain-free intensities.  Standing march at support surface  x 10 with UE support Standing alt LE tap on 6" step 2x10  Standing hip abduction 2 x 10 each LE, with UE support   Nustep lvl 0 (seat 8, UE 7) x 3 min. Fatiguing for pt- SPM maintained in 40s. PT cues  for technique/intensity/LE positioning and provides  close CGA for mount/dismount     PATIENT EDUCATION:  Education details:Pt educated throughout session about proper posture and technique with exercises. Improved exercise technique, movement at target joints, use of target muscles after min to mod verbal, visual, tactile cues.  Person educated: Patient and DIL Education method: Explanation, Demonstration, Verbal cues, and Handouts Education comprehension: verbalized understanding, returned demonstration, and needs further education  HOME EXERCISE PROGRAM:  Access Code: [email or web address] URL: [email or web address] Date: 05/16/2023 Prepared by: Temple Pacini  Exercises - Seated Long Arc Quad  - 2-3 x daily - 5-7 x weekly - 3 sets - 10 reps - Seated March  - 2-3 x daily - 5-7 x weekly - 3 sets - 10 reps - Seated Heel Raise  - 2-3 x daily - 5-7 x weekly - 3 sets - 10 reps - Sit to Stand with Counter Support  - 2-3 x daily - 5-7 x weekly - 3 sets - 10 reps Rates interventions medium-hard. Cued to perform only in pain-free ranges/intensities  ASSESSMENT:  CLINICAL IMPRESSION: Reviewed former HEP with pt with some modifications (perform STS at support surface). Pt tolerated all interventions well but does fatigue easily. Plan to provide more endurance interventions next visit. The pt will benefit from skilled PT to address strength,endurance and balance deficits to allow improved QoL and return to PLOF.   OBJECTIVE IMPAIRMENTS: Abnormal gait, decreased activity tolerance, decreased balance, decreased endurance, decreased knowledge of condition, decreased knowledge of use of DME, decreased mobility, difficulty walking, decreased strength, impaired perceived functional ability, and impaired sensation.   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   REHAB POTENTIAL: Good  CLINICAL DECISION MAKING: Stable/uncomplicated  EVALUATION COMPLEXITY: Moderate   GOALS: Goals reviewed with patient? Yes  SHORT TERM GOALS: Target date: 06/17/2023    Patient will be independent in home exercise program to improve strength/mobility for better functional independence with ADLs. Baseline: to be give at next visit Goal status: INITIAL   LONG TERM GOALS: Target date: 07/08/2023    Patient will increase FOTO score to equal to or greater than  62   to demonstrate statistically significant improvement in mobility and quality of life.  Baseline: 53 Goal status: INITIAL  2.  Patient (> 75 years old) will complete five times sit to stand test in < 15 seconds indicating an increased LE strength and improved balance. Baseline: 24.5 Goal status: INITIAL  3.  Patient will increase Berg Balance score by > 6 points to demonstrate decreased fall risk during functional activities Baseline: 39 Goal status: INITIAL  4.  Patient will increase 10 meter walk test to >.17m/s as to improve gait speed for better community ambulation and to reduce fall risk. Baseline: 0.51 Goal status: INITIAL  5.  Patient will reduce timed up and go to <15 seconds to reduce fall risk and demonstrate improved transfer/gait ability. Baseline: 30.3sec Goal status: INITIAL  6.  Patient will self report ability to feed chickens to indicate improve return to PLOF and reduced dependence on family for household chores  Baseline: unable to perform Goal status: INITIAL    PLAN:  PT FREQUENCY: 1-2x/week  PT DURATION: 8 weeks  PLANNED INTERVENTIONS: Therapeutic exercises, Therapeutic activity, Neuromuscular re-education, Balance training, Gait training, Patient/Family education, Self Care, Joint mobilization, Stair training, Visual/preceptual  remediation/compensation, Orthotic/Fit training, DME  instructions, Moist heat, Compression bandaging, scar mobilization, Splintting, Taping, and Manual therapy  PLAN FOR NEXT SESSION:   Endurance, strength, balance   Temple Pacini PT, DPT  Physical Therapist - [place]  [facility]  5:19 PM 05/16/23

## 2023-05-20 ENCOUNTER — Encounter: Payer: Self-pay | Admitting: Podiatry

## 2023-05-21 ENCOUNTER — Ambulatory Visit (INDEPENDENT_AMBULATORY_CARE_PROVIDER_SITE_OTHER): Payer: Medicare Other | Admitting: Nurse Practitioner

## 2023-05-21 ENCOUNTER — Ambulatory Visit (INDEPENDENT_AMBULATORY_CARE_PROVIDER_SITE_OTHER): Payer: Medicare Other

## 2023-05-21 ENCOUNTER — Other Ambulatory Visit (INDEPENDENT_AMBULATORY_CARE_PROVIDER_SITE_OTHER): Payer: Self-pay | Admitting: Nurse Practitioner

## 2023-05-21 ENCOUNTER — Encounter (INDEPENDENT_AMBULATORY_CARE_PROVIDER_SITE_OTHER): Payer: Self-pay | Admitting: Nurse Practitioner

## 2023-05-21 ENCOUNTER — Encounter: Payer: Self-pay | Admitting: Podiatry

## 2023-05-21 ENCOUNTER — Ambulatory Visit (INDEPENDENT_AMBULATORY_CARE_PROVIDER_SITE_OTHER): Payer: Medicare Other | Admitting: Podiatry

## 2023-05-21 VITALS — BP 155/70 | HR 66 | Resp 16 | Wt 166.0 lb

## 2023-05-21 DIAGNOSIS — Z9889 Other specified postprocedural states: Secondary | ICD-10-CM | POA: Diagnosis not present

## 2023-05-21 DIAGNOSIS — Z89421 Acquired absence of other right toe(s): Secondary | ICD-10-CM

## 2023-05-21 DIAGNOSIS — R6889 Other general symptoms and signs: Secondary | ICD-10-CM

## 2023-05-21 DIAGNOSIS — I7025 Atherosclerosis of native arteries of other extremities with ulceration: Secondary | ICD-10-CM

## 2023-05-21 DIAGNOSIS — I739 Peripheral vascular disease, unspecified: Secondary | ICD-10-CM | POA: Diagnosis not present

## 2023-05-21 NOTE — Progress Notes (Signed)
Chief Complaint  Patient presents with   Routine Post Op    Subjective:  Patient presents today status post right second toe amputation inpatient.  DOS: 04/16/2023.  Patient was admitted for complications arising from prior popliteal peroneal artery bypass graft.  Presenting in the office today for outpatient follow-up regarding the toe amputation.  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   a.) TTE 08/27/2022: Ao sinus 4.1, asc Ao 3.7, ST junction 3.3   Atherosclerotic PVD with intermittent claudication (HCC)    a.) s/p vascular intervention 03/26/2018:  post tibial and peroneal crosser athrectomy, PTA LEFT post tibial, SFA, and popliteal arteries; b.) s/p PTA 03/20/2023: RIGHT SFA and popliteal arteries   Bilateral carotid artery disease (HCC) 08/22/2022   a.) carotid doppler 08/22/2022: <50 LICA, 50-69% RICA   Blurry vision, bilateral    CAD (coronary artery disease) 09/21/2022   a.) cCTA 09/21/2022: 591 (57 %ile for age/sex/race matched control)   Chest pain    Chronic heart failure with preserved ejection fraction (HFpEF) (HCC)    a.) TTE 08/27/2022: EF >55%, aym LVH, mild LAE, triv AR/PR, mild MR/TR. mild AS, Ao root dilatation, G1DD   CKD (chronic kidney disease), stage III (HCC)    Gangrene of toe of right foot (HCC)    a.) RIGHT second toe   Heart murmur    History of bilateral cataract extraction 2020   HLD (hyperlipidemia)    Hypertension    Incomplete right bundle branch block (RBBB)    Long term current use of antithrombotics/antiplatelets    a.) clopidogrel   Neuropathy    Palpitations    T2DM (type 2 diabetes mellitus) (HCC)    Vertigo    Wears dentures    Full upper and lower (loose)    Past Surgical History:  Procedure Laterality Date   AMPUTATION TOE Left 03/28/2018   Procedure: AMPUTATION TOE/ PARTIAL RAY RESECTION-LEFT 2ND AND  3RD;  Surgeon: Linus Galas, DPM;  Location: [facility];  Service: Podiatry;  Laterality: Left;   AMPUTATION TOE Right 04/16/2023   Procedure: AMPUTATION TOE;  Surgeon: Felecia Shelling, DPM;  Location: [facility];  Service: Orthopedics/Podiatry;  Laterality: Right;   CATARACT EXTRACTION W/PHACO Left 02/10/2019   Procedure: CATARACT EXTRACTION PHACO AND INTRAOCULAR LENS PLACEMENT (IOC)  LEFT DIABETIC;  Surgeon: Nevada Crane, MD;  Location: [facility];  Service: Ophthalmology;  Laterality: Left;  Diabetic - oral meds   CATARACT EXTRACTION W/PHACO Right 04/20/2019   Procedure: CATARACT EXTRACTION PHACO AND INTRAOCULAR LENS PLACEMENT (IOC)  RIGHT DIABETIC;  Surgeon: Nevada Crane, MD;  Location: [facility];  Service: Ophthalmology;  Laterality: Right;   FEMORAL-TIBIAL BYPASS GRAFT Right 04/11/2023   Procedure: BYPASS GRAFT FEMORAL-TIBIAL ARTERY ( POPLITEAL TO PERONEAL);  Surgeon: Renford Dills, MD;  Location: [facility];  Service: Vascular;  Laterality: Right;   HERNIA REPAIR     LOWER EXTREMITY ANGIOGRAPHY Left 03/26/2018   Procedure: Lower Extremity Angiography;  Surgeon: Renford Dills, MD;  Location: [facility];  Service: Cardiovascular;  Laterality: Left;   LOWER EXTREMITY ANGIOGRAPHY Right 03/20/2023   Procedure: Lower Extremity Angiography;  Surgeon: Renford Dills, MD;  Location: [facility];  Service: Cardiovascular;  Laterality: Right;   LOWER EXTREMITY INTERVENTION Right 05/03/2023   Procedure: LOWER EXTREMITY INTERVENTION;  Surgeon: Renford Dills, MD;  Location: [facility]  INVASIVE CV LAB;  Service: Cardiovascular;  Laterality: Right;  With Staple removal   Percutaneous transluminal angioplasty right superficial femoral artery      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   Procedure: TRANSMETATARSAL AMPUTATION;  Surgeon: Recardo Evangelist, DPM;  Location: [facility];  Service: Podiatry;  Laterality: Left;    Allergies  Allergen Reactions    Haemophilus B Polysaccharide Vaccine Nausea And Vomiting and Other (See Comments)    Fever, chills, vomiting.   Influenza Virus Vaccine Other (See Comments) and Nausea And Vomiting    Fever, chills, vomiting.     RT second toe amputation site 05/21/2023  Objective/Physical Exam Skin is warm to touch. Persistant dehiscence along the amputation site with extensive fibrotic debris at the amputation wound bed.  No malodor.  Slight serous drainage.  No active bleeding. The ulcer measures approximately 1.5 x 1.5 x 1.5 cm.  There is exposed deeper muscle and fascial tissue.  There is no exposed bone at the moment.  No malodor.  Clinically it appears stable with no acute infection.  Minimal sanguinous drainage with debridement.  ABI Findings:  +--------+------------------+-----+----------+--------+  Right  Rt Pressure (mmHg)IndexWaveform  Comment   +--------+------------------+-----+----------+--------+  [id number]                                       +--------+------------------+-----+----------+--------+  PTA    84                0.47 monophasic          +--------+------------------+-----+----------+--------+  PERO   109               0.61 monophasic          +--------+------------------+-----+----------+--------+   +---------+------------------+-----+-------------------+--------+  Left    Lt Pressure (mmHg)IndexWaveform           Comment   +---------+------------------+-----+-------------------+--------+  Brachial 178                                                 +---------+------------------+-----+-------------------+--------+  ATA     67                0.38 dampened monophasic          +---------+------------------+-----+-------------------+--------+  PTA     120               0.67 monophasic                   +---------+------------------+-----+-------------------+--------+  PERO    93                0.52 monophasic                    +---------+------------------+-----+-------------------+--------+  Great Toe                                          1/2 Foot  +---------+------------------+-----+-------------------+--------+   +-------+-----------+-----------+------------+------------+  ABI/TBIToday's ABIToday's TBIPrevious ABIPrevious TBI  +-------+-----------+-----------+------------+------------+  Right .61                   1.14                      +-------+-----------+-----------+------------+------------+  Left  .67                   .91                       +-------+-----------+-----------+------------+------------+  Bilateral ABIs appear decreased compared to prior study on 11/03/2018.    Summary:  Right: Resting right ankle-brachial index indicates moderate right lower extremity arterial disease.  Left: Resting left ankle-brachial index indicates moderate left lower  extremity arterial disease.   Radiographic Exam RT foot 05/07/2023:  Interval amputation of the right second toe at the level of the MTP.  Stable.  No erosions that would be concerning for osteomyelitis  Assessment: 1. s/p amputation right second toe inpatient. DOS: 04/16/2023 2.  S/p popliteal-peroneal artery bypass graft.  DOS: 04/11/2023.  Dr. Gilda Crease, [facility] 3. 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  -Medically necessary excisional debridement including muscle and deep fascial tissue was performed today using a tissue nipper.  Excisional debridement of the necrotic nonviable tissue down to healthier bleeding viable tissue was performed with postdebridement measurement same as pre- -Pt had updated noninvasive vascular studies today.  Results pending -Continue close management with Deepstep [facility] -Continue Betadine wet-to-dry dressings daily -Continue WBAT surgical shoe - I do believe the patient would benefit from negative pressure wound VAC therapy. Will have our office  reach out to [facility] for authorization -Return to clinic 2 weeks   Felecia Shelling, DPM [facility]  Dr. Felecia Shelling, DPM    [address] [place], [place] [address]                Office [phone]  Fax [phone]

## 2023-05-22 ENCOUNTER — Encounter (INDEPENDENT_AMBULATORY_CARE_PROVIDER_SITE_OTHER): Payer: Self-pay | Admitting: Nurse Practitioner

## 2023-05-22 NOTE — Progress Notes (Signed)
Subjective:    Patient ID: [name], male    DOB: Jan 07, 1941, 82 y.o.   MRN: [id number] Chief Complaint  Patient presents with   Follow-up    Ultrasound follow up    [name] is an 82 year old male who presents today for follow-up evaluation of  Right below-the-knee popliteal artery to peroneal artery bypass with reverse saphenous vein graft.  This was done on 04/11/2023.  He subsequently had some swelling in his groin and was found to have a seroma.  This was aspirated on 05/03/2023.  This is still present but has had some improvement.  the patient continues to have a seroma but he notes that it is much less painful.  The wounds themselves are healing without evidence of dehiscence.  He also had a toe amputation which is being followed by Dr. Logan [name]  This is showing some evidence of improvement.  Noninvasive studies in our an ABI 0.61 on the right and 0.67 on the left.  Today arterial duplexes were done which shows biphasic/triphasic waveforms throughout the lower extremities down to the level of the tibial vessels.  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  This is suggestive that the patient's bypass is patent although it is not there is no noted occlusion on duplex      Review of Systems  Skin:  Positive for wound.  Neurological:  Positive for weakness.  All other systems reviewed and are negative.      Objective:   Physical Exam Vitals reviewed.  HENT:     Head: Normocephalic.  Cardiovascular:     Rate and Rhythm: Normal rate.     Pulses:          Dorsalis pedis pulses are detected w/ Doppler on the right side.       Posterior tibial pulses are detected w/ Doppler on the right side.  Pulmonary:     Effort: Pulmonary effort is normal.  Skin:    General: Skin is warm and dry.  Neurological:     Mental Status: He is alert and oriented to person,  place, and time.     Motor: Weakness present.  Psychiatric:        Mood and Affect: Mood normal.        Behavior: Behavior normal.        Thought Content: Thought content normal.        Judgment: Judgment normal.     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   a.) TTE 08/27/2022: Ao sinus 4.1, asc Ao 3.7, ST junction 3.3   Atherosclerotic PVD with intermittent claudication (HCC)    a.) s/p vascular intervention 03/26/2018:  post tibial and peroneal crosser athrectomy, PTA LEFT post tibial, SFA, and popliteal arteries; b.) s/p PTA 03/20/2023: RIGHT SFA and popliteal arteries   Bilateral carotid artery disease (HCC) 08/22/2022   a.) carotid doppler 08/22/2022: <50 LICA, 50-69% RICA   Blurry vision, bilateral    CAD (coronary artery disease) 09/21/2022   a.) cCTA 09/21/2022: 591 (57 %ile for age/sex/race matched control)   Chest pain    Chronic heart failure  with preserved ejection fraction (HFpEF) (HCC)    a.) TTE 08/27/2022: EF >55%, aym LVH, mild LAE, triv AR/PR, mild MR/TR. mild AS, Ao root dilatation, G1DD   CKD (chronic kidney disease), stage III (HCC)    Gangrene of toe of right foot (HCC)    a.) RIGHT second toe   Heart murmur    History of bilateral cataract extraction 2020   HLD (hyperlipidemia)    Hypertension    Incomplete right bundle branch block (RBBB)    Long term current use of antithrombotics/antiplatelets    a.) clopidogrel   Neuropathy    Palpitations    T2DM (type 2 diabetes mellitus) (HCC)    Vertigo    Wears dentures    Full upper and lower (loose)    Social History   Socioeconomic History   Marital status: Married    Spouse name: Not on file   Number of children: Not on file   Years of education: Not on file   Highest  education level: Not on file  Occupational History   Not on file  Tobacco Use   Smoking status: Former    Current packs/day: 0.00    Types: Cigarettes    Quit date: 26    Years since quitting: 39.7   Smokeless tobacco: Never  Vaping Use   Vaping status: Never Used  Substance and Sexual Activity   Alcohol use: Yes    Comment: once a month or once every 2-3 mos   Drug use: Never   Sexual activity: Never  Other Topics Concern   Not on file  Social History Narrative   Not on file   Social Determinants of Health   Financial Resource Strain: Low Risk  (01/10/2023)   Received from [facility], [facility]   Overall Financial Resource Strain (CARDIA)    Difficulty of Paying Living Expenses: Not very hard  Food Insecurity: No Food Insecurity (05/03/2023)   Hunger Vital Sign    Worried About Running Out of Food in the Last Year: Never true    Ran Out of Food in the Last Year: Never true  Transportation Needs: No Transportation Needs (05/03/2023)   PRAPARE - Administrator, Civil Service (Medical): No    Lack of Transportation (Non-Medical): No  Physical Activity: Not on file  Stress: Not on file  Social Connections: Not on file  Intimate Partner Violence: Not At Risk (05/03/2023)   Humiliation, Afraid, Rape, and Kick questionnaire    Fear of Current or Ex-Partner: No    Emotionally Abused: No    Physically Abused: No    Sexually Abused: No    Past Surgical History:  Procedure Laterality Date   AMPUTATION TOE Left 03/28/2018   Procedure: AMPUTATION TOE/ PARTIAL RAY RESECTION-LEFT 2ND AND 3RD;  Surgeon: Linus Galas, DPM;  Location: [facility];  Service: Podiatry;  Laterality: Left;   AMPUTATION TOE Right 04/16/2023   Procedure: AMPUTATION TOE;  Surgeon: Felecia Shelling, DPM;  Location: [facility];  Service: Orthopedics/Podiatry;  Laterality: Right;   CATARACT EXTRACTION W/PHACO Left 02/10/2019   Procedure: CATARACT EXTRACTION PHACO AND  INTRAOCULAR LENS PLACEMENT (IOC)  LEFT DIABETIC;  Surgeon: Nevada Crane, MD;  Location: [facility];  Service: Ophthalmology;  Laterality: Left;  Diabetic - oral meds   CATARACT EXTRACTION W/PHACO Right 04/20/2019   Procedure: CATARACT EXTRACTION PHACO AND INTRAOCULAR LENS PLACEMENT (IOC)  RIGHT DIABETIC;  Surgeon: Nevada Crane, MD;  Location: [facility];  Service: Ophthalmology;  Laterality: Right;   FEMORAL-TIBIAL BYPASS GRAFT Right 04/11/2023   Procedure: BYPASS GRAFT FEMORAL-TIBIAL ARTERY ( POPLITEAL TO PERONEAL);  Surgeon: Renford Dills, MD;  Location: [facility];  Service: Vascular;  Laterality: Right;   HERNIA REPAIR     LOWER EXTREMITY ANGIOGRAPHY Left 03/26/2018   Procedure: Lower Extremity Angiography;  Surgeon: Renford Dills, MD;  Location: [facility];  Service: Cardiovascular;  Laterality: Left;   LOWER EXTREMITY ANGIOGRAPHY Right 03/20/2023   Procedure: Lower Extremity Angiography;  Surgeon: Renford Dills, MD;  Location: [facility];  Service: Cardiovascular;  Laterality: Right;   LOWER EXTREMITY INTERVENTION Right 05/03/2023   Procedure: LOWER EXTREMITY INTERVENTION;  Surgeon: Renford Dills, MD;  Location: [facility];  Service: Cardiovascular;  Laterality: Right;  With Staple removal   Percutaneous transluminal angioplasty right superficial femoral artery      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   Influenza Virus Vaccine Other (See Comments) and Nausea And Vomiting     Fever, chills, vomiting.        Latest Ref Rng & Units 05/04/2023    6:17 AM 05/03/2023    6:13 AM 05/02/2023    2:58 PM  CBC  WBC 4.0 - 10.5 K/uL 8.1  11.7  12.2   Hemoglobin 13.0 - 17.0 g/dL 8.2  8.4  96.7   Hematocrit 39.0 - 52.0 % 25.5  24.9  31.2   Platelets 150 - 400 K/uL 176  180  203       CMP     Component Value Date/Time   NA 135 05/04/2023 0617   K 4.2 05/04/2023 0617   CL 107 05/04/2023 0617   CO2 21 (L) 05/04/2023 0617   GLUCOSE 96 05/04/2023 0617   BUN 19 05/04/2023 0617   CREATININE 1.22 05/04/2023 0617   CALCIUM 8.2 (L) 05/04/2023 0617   PROT 7.5 05/02/2023 1458   ALBUMIN 3.7 05/02/2023 1458   AST 24 05/02/2023 1458   ALT 31 05/02/2023 1458   ALKPHOS 61 05/02/2023 1458   BILITOT 0.8 05/02/2023 1458   GFRNONAA 59 (L) 05/04/2023 0617     No results found.     Assessment & Plan:   1. Atherosclerosis of native arteries of the extremities with ulceration (HCC) Based on noninvasive studies today appears that the patient would have possible marginal ability for wound healing.  Concerned that some of the patient is usually a small vessel disease which may proved to have a little bit more difficult.  Will have the patient return in several weeks to see if there has been improvement.  They are advised that if the wound deteriorates or begins to show signs of starting to contact us for sooner follow-up.   Current Outpatient Medications on File Prior to Visit  Medication Sig Dispense Refill   acetaminophen (TYLENOL) 325 MG tablet Take 2 tablets (650 mg total) by mouth every 6 (six) hours as needed for mild pain or fever. 20 tablet 0   aspirin EC 81 MG  tablet Take 1 tablet (81 mg total) by mouth daily at 6 (six) AM. Swallow whole. 30 tablet 12   ciprofloxacin (CIPRO) 500 MG tablet Take 1 tablet (500 mg total) by mouth 2 (two) times daily for 10 days. 20 tablet 0   clopidogrel (PLAVIX) 75 MG tablet Take 75 mg by mouth daily.     docusate sodium (COLACE) 100 MG  capsule Take 1 capsule (100 mg total) by mouth daily. 10 capsule 0   doxycycline (VIBRA-TABS) 100 MG tablet Take 1 tablet (100 mg total) by mouth 2 (two) times daily. 20 tablet 0   metoprolol tartrate (LOPRESSOR) 25 MG tablet Take tablet TWO hours prior to his cardiac CT scan. 30 tablet 0   Multiple Vitamin (MULTIVITAMIN) tablet Take 1 tablet by mouth 3 (three) times a week.     rosuvastatin (CRESTOR) 10 MG tablet Take 1 tablet (10 mg total) by mouth daily. 30 tablet 11   valsartan (DIOVAN) 80 MG tablet Take 1 tablet (80 mg total) by mouth daily. 30 tablet 0   oxyCODONE-acetaminophen (PERCOCET/ROXICET) 5-325 MG tablet Take 1-2 tablets by mouth every 4 (four) hours as needed for moderate pain. 30 tablet 0   No current facility-administered medications on file prior to visit.    There are no Patient Instructions on file for this visit. No follow-ups on file.   Georgiana Spinner, NP

## 2023-05-23 ENCOUNTER — Ambulatory Visit: Payer: Medicare Other | Attending: Nurse Practitioner

## 2023-05-23 DIAGNOSIS — M6281 Muscle weakness (generalized): Secondary | ICD-10-CM | POA: Diagnosis present

## 2023-05-23 DIAGNOSIS — R262 Difficulty in walking, not elsewhere classified: Secondary | ICD-10-CM | POA: Diagnosis present

## 2023-05-23 DIAGNOSIS — R2681 Unsteadiness on feet: Secondary | ICD-10-CM | POA: Diagnosis present

## 2023-05-23 NOTE — Therapy (Signed)
OUTPATIENT PHYSICAL THERAPY LOWER EXTREMITY TREATMENT NOTE   Patient Name: [name] MRN: [id number] DOB:26-Nov-1940, 82 y.o., male Today's Date: 05/23/2023  END OF SESSION:  PT End of Session - 05/23/23 1317     Visit Number 3    Number of Visits 16    Date for PT Re-Evaluation 07/08/23    Authorization Type [employer] Medicare    Authorization Time Period 04/29/23-07/22/23    Progress Note Due on Visit 10    PT Start Time 1317    PT Stop Time 1357    PT Time Calculation (min) 40 min    Equipment Utilized During Treatment Gait belt    Activity Tolerance Patient tolerated treatment well;No increased pain    Behavior During Therapy [facility] for tasks assessed/performed              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   a.) TTE 08/27/2022: Ao sinus 4.1, asc Ao 3.7, ST junction 3.3   Atherosclerotic PVD with intermittent claudication (HCC)    a.) s/p vascular intervention 03/26/2018:  post tibial and peroneal crosser athrectomy, PTA LEFT post tibial, SFA, and popliteal arteries; b.) s/p PTA 03/20/2023: RIGHT SFA and popliteal arteries   Bilateral carotid artery disease (HCC) 08/22/2022   a.) carotid doppler 08/22/2022: <50 LICA, 50-69% RICA   Blurry vision, bilateral    CAD (coronary artery disease) 09/21/2022   a.) cCTA 09/21/2022: 591 (57 %ile for age/sex/race matched control)   Chest pain    Chronic heart failure with preserved ejection fraction (HFpEF) (HCC)    a.) TTE 08/27/2022: EF >55%, aym LVH, mild LAE, triv AR/PR, mild MR/TR. mild AS, Ao root dilatation, G1DD   CKD (chronic kidney disease), stage III (HCC)    Gangrene of toe of right foot (HCC)    a.) RIGHT second toe   Heart murmur    History of bilateral cataract extraction 2020   HLD (hyperlipidemia)    Hypertension    Incomplete right bundle branch block (RBBB)    Long term current use  of antithrombotics/antiplatelets    a.) clopidogrel   Neuropathy    Palpitations    T2DM (type 2 diabetes mellitus) (HCC)    Vertigo    Wears dentures    Full upper and lower (loose)   Past Surgical History:  Procedure Laterality Date   AMPUTATION TOE Left 03/28/2018   Procedure: AMPUTATION TOE/ PARTIAL RAY RESECTION-LEFT 2ND AND 3RD;  Surgeon: Linus Galas, DPM;  Location: [facility];  Service: Podiatry;  Laterality: Left;   AMPUTATION TOE Right 04/16/2023   Procedure: AMPUTATION TOE;  Surgeon: Felecia Shelling, DPM;  Location: [facility];  Service: Orthopedics/Podiatry;  Laterality: Right;   CATARACT EXTRACTION W/PHACO Left 02/10/2019   Procedure: CATARACT EXTRACTION PHACO AND INTRAOCULAR LENS PLACEMENT (IOC)  LEFT DIABETIC;  Surgeon: Nevada Crane, MD;  Location: [facility];  Service: Ophthalmology;  Laterality: Left;  Diabetic - oral meds   CATARACT EXTRACTION W/PHACO Right 04/20/2019   Procedure: CATARACT EXTRACTION PHACO AND INTRAOCULAR LENS PLACEMENT (IOC)  RIGHT DIABETIC;  Surgeon: Nevada Crane, MD;  Location: [facility];  Service: Ophthalmology;  Laterality: Right;   FEMORAL-TIBIAL BYPASS GRAFT Right 04/11/2023   Procedure: BYPASS GRAFT FEMORAL-TIBIAL ARTERY ( POPLITEAL TO PERONEAL);  Surgeon: Renford Dills, MD;  Location: [facility];  Service: Vascular;  Laterality: Right;   HERNIA  REPAIR     LOWER EXTREMITY ANGIOGRAPHY Left 03/26/2018   Procedure: Lower Extremity Angiography;  Surgeon: Renford Dills, MD;  Location: [facility];  Service: Cardiovascular;  Laterality: Left;   LOWER EXTREMITY ANGIOGRAPHY Right 03/20/2023   Procedure: Lower Extremity Angiography;  Surgeon: Renford Dills, MD;  Location: [facility];  Service: Cardiovascular;  Laterality: Right;   LOWER EXTREMITY INTERVENTION Right 05/03/2023   Procedure: LOWER EXTREMITY INTERVENTION;  Surgeon: Renford Dills, MD;  Location: [facility];  Service:  Cardiovascular;  Laterality: Right;  With Staple removal   Percutaneous transluminal angioplasty right superficial femoral artery      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   Procedure: TRANSMETATARSAL AMPUTATION;  Surgeon: Recardo Evangelist, DPM;  Location: [facility];  Service: Podiatry;  Laterality: Left;   Patient Active Problem List   Diagnosis Date Noted   Postoperative seroma of subcutaneous tissue after non-dermatologic procedure 05/05/2023   AKI (acute kidney injury) (HCC) 05/03/2023   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) 04/11/2023   Chronic kidney disease, stage 3a (HCC) 03/19/2023   Chronic diastolic CHF (congestive heart failure) (HCC) 03/19/2023   PVD (peripheral vascular disease) (HCC) 03/19/2023   CAD (coronary artery disease) 03/19/2023   Gangrene of toe of right foot (HCC) 03/19/2023   Statin declined 05/18/2022   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   Atherosclerosis of native arteries of the extremities with ulceration (HCC) 04/28/2018   Diabetes (HCC) 04/28/2018   Essential hypertension 04/28/2018   Diabetic foot infection (HCC) 03/24/2018    PCP: Wilford Corner, PA-C   REFERRING PROVIDER: Georgiana Spinner, NP   REFERRING DIAG: I70.25 (ICD-10-CM) - Atherosclerosis of native arteries of the extremities with ulceration (HCC)   THERAPY DIAG:  Difficulty in walking, not elsewhere classified  Muscle weakness (generalized)  Unsteadiness on feet  Rationale for Evaluation and Treatment: Rehabilitation  ONSET DATE: 04/11/2023 for Bypass graft and Toe amputation on 04/16/2023  SUBJECTIVE:   SUBJECTIVE STATEMENT: Pt reports he's done some of his HEP. He is walking some "but not much." Pt reports R foot pain wakes him up and night, but walking helps improve it. Pt reports he is taking antibiotics currently.  PERTINENT HISTORY:  [name] is an 82yoM who is referred to OPPT for balance issues in the setting of recent hopsital  admission, Rt PopPer bypass RLE 04/11/23 and Rt 2nd toe amputation 04/16/23. Pt was DC to home with RW (unable to use due to space) and surgical shoe (wrong shoe but podiatry says 'probably fine') WBAT. Pt is using SPC for AMB out of house, furniture for in-home AMB, has not returned to AMB over soft surfaces (feeding his chickens). Pt has not had any falls in recent months, however has remote falls. Pt formerly limited also by visual impairments 2/2 cataracts.    PAIN:  Are you having pain? Yes: NPRS scale: 1/10 Pain location: R foot  Pain description: soreness,  Aggravating factors: laying down  Relieving factors: walking   Reports pain gets up to 5/10   PRECAUTIONS: Other: recent toe amputation. -Continue WBAT surgical shoe   RED FLAGS: None   WEIGHT BEARING RESTRICTIONS: No  FALLS:  Has patient fallen in last 6 months? No  LIVING ENVIRONMENT: Lives with: lives with their spouse Wife has health problems with back and "everything else" Lives in: Mobile home Stairs: Yes: External: 1 steps; none Has following equipment at home: Single point cane and Walker - 2 wheeled  OCCUPATION: retured  PLOF: Independent and reports that Son has to help   PATIENT GOALS: improve safety with walking and strength in BLE so he can get out and feed the chickens.   NEXT MD VISIT: Dr Logan Bores 9/3   OBJECTIVE:   DIAGNOSTIC FINDINGS:   EXAM: 8/15 CT OF THE LOWER RIGHT EXTREMITY WITH CONTRAST IMPRESSION: Early developing abscesses versus infected seromas in the anteromedial right upper thigh, as described above.  8/20 Radiographic Exam RT foot 05/07/2023:  Interval amputation of the right second toe at the level of the MTP.  Stable.  No erosions that would be concerning for osteomyelitis   PATIENT SURVEYS:  FOTO 53  COGNITION: Overall cognitive status: Within functional limits for tasks assessed     SENSATION: Light touch: Impaired      POSTURE: rounded shoulders and forward  head  PALPATION: No tenderness reported to palpation on this day.   LOWER EXTREMITY ROM:  Grossly WFL with mild decreased ankle DF in BLE  LOWER EXTREMITY MMT:  MMT Right eval Left eval  Hip flexion 4- 4  Hip extension 4 4  Hip abduction 4 4  Hip adduction 4+ 4+  Hip internal rotation    Hip external rotation    Knee flexion 4+ 4+  Knee extension 5 5  Ankle dorsiflexion At least 3 At least 3   Ankle plantarflexion At least 3 At least 3   Ankle inversion    Ankle eversion     (Blank rows = not tested)    FUNCTIONAL TESTS:  5 times sit to stand: 24.5sec Timed up and go (TUG): 30.33 10 meter walk test: 19.52sec (0.44m/s) Berg Balance Scale: 39  GAIT: Distance walked: 70 Assistive device utilized: None Level of assistance: SBA Comments: mild antalgia in the RLE   TODAY'S TREATMENT:                                                                                                                              DATE: 05/23/2023  TE:  Nustep lvl 0-4 (seat 7, UE 7) x 3 min. Fatiguing for pt- SPM maintained in 30-40s. PT cues for technique/intensity/LE positioning and provides close CGA for mount/dismount  Circuit 1: Ambulation with SPC over red mat and through clinic x148 ft STS 10x Seated march 12x each LE  Seated LAQ 12x each LE Rates medium  Circuit 2: Ambulation with SPC over red mat and through clinic x148 ft STS 10x Seated march 15x each LE  Seated LAQ 15x each LE Pt reports more difficulty with LLE than RLE   Circuit 3: Ambulation with SPC over red mat and through clinic x148 ft STS 10x with holding 1000 gr ball  Seated march 20x each LE  Seated LAQ 20x each LE P.ball squeeze 2x10 with 3 sec hold/rep (placed as to not be over old incision site)      PATIENT EDUCATION:  Education details:Pt educated throughout session about proper posture and technique with exercises.  Improved exercise technique, movement at target joints, use of target muscles after  min to mod verbal, visual, tactile cues.  Person educated: Patient and DIL Education method: Explanation, Demonstration, Verbal cues, and Handouts Education comprehension: verbalized understanding, returned demonstration, and needs further education  HOME EXERCISE PROGRAM:  Access Code: [email or web address] URL: [email or web address] Date: 05/16/2023 Prepared by: Temple Pacini  Exercises - Seated Long Arc Quad  - 2-3 x daily - 5-7 x weekly - 3 sets - 10 reps - Seated March  - 2-3 x daily - 5-7 x weekly - 3 sets - 10 reps - Seated Heel Raise  - 2-3 x daily - 5-7 x weekly - 3 sets - 10 reps - Sit to Stand with Counter Support  - 2-3 x daily - 5-7 x weekly - 3 sets - 10 reps Rates interventions medium-hard. Cued to perform only in pain-free ranges/intensities  ASSESSMENT:  CLINICAL IMPRESSION: Advanced strengthening interventions today with circuit training, where pt performed more reps and nustep with increased resistance. Pt responded well to all interventions without pain and with minimal fatigue. The pt will benefit from skilled PT to address strength,endurance and balance deficits to allow improved QoL and return to PLOF.   OBJECTIVE IMPAIRMENTS: Abnormal gait, decreased activity tolerance, decreased balance, decreased endurance, decreased knowledge of condition, decreased knowledge of use of DME, decreased mobility, difficulty walking, decreased strength, impaired perceived functional ability, and impaired sensation.   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   REHAB POTENTIAL: Good  CLINICAL DECISION MAKING: Stable/uncomplicated  EVALUATION COMPLEXITY: Moderate   GOALS: Goals reviewed with patient? Yes  SHORT  TERM GOALS: Target date: 06/17/2023    Patient will be independent in home exercise program to improve strength/mobility for better functional independence with ADLs. Baseline: to be give at next visit Goal status: INITIAL   LONG TERM GOALS: Target date: 07/08/2023    Patient will increase FOTO score to equal to or greater than  62   to demonstrate statistically significant improvement in mobility and quality of life.  Baseline: 53 Goal status: INITIAL  2.  Patient (> 54 years old) will complete five times sit to stand test in < 15 seconds indicating an increased LE strength and improved balance. Baseline: 24.5 Goal status: INITIAL  3.  Patient will increase Berg Balance score by > 6 points to demonstrate decreased fall risk during functional activities Baseline: 39 Goal status: INITIAL  4.  Patient will increase 10 meter walk test to >.61m/s as to improve gait speed for better community ambulation and to reduce fall risk. Baseline: 0.51 Goal status: INITIAL  5.  Patient will reduce timed up and go to <15 seconds to reduce fall risk and demonstrate improved transfer/gait ability. Baseline: 30.3sec Goal status: INITIAL  6.  Patient will self report ability to feed chickens to indicate improve return to PLOF and reduced dependence on family for household chores  Baseline: unable to perform Goal status: INITIAL    PLAN:  PT FREQUENCY: 1-2x/week  PT DURATION: 8 weeks  PLANNED INTERVENTIONS: Therapeutic exercises, Therapeutic activity, Neuromuscular re-education, Balance training, Gait training, Patient/Family education, Self Care, Joint mobilization, Stair training, Visual/preceptual remediation/compensation, Orthotic/Fit training, DME instructions, Moist heat, Compression bandaging, scar mobilization, Splintting, Taping, and Manual therapy  PLAN FOR NEXT SESSION:   Endurance, strength, balance   Rolly Salter  Cathren Harsh, DPT  Physical Therapist - [place]  [facility]  1:59 PM 05/23/23

## 2023-05-27 ENCOUNTER — Ambulatory Visit: Payer: Medicare Other

## 2023-05-27 DIAGNOSIS — R262 Difficulty in walking, not elsewhere classified: Secondary | ICD-10-CM | POA: Diagnosis not present

## 2023-05-27 DIAGNOSIS — R2681 Unsteadiness on feet: Secondary | ICD-10-CM

## 2023-05-27 DIAGNOSIS — M6281 Muscle weakness (generalized): Secondary | ICD-10-CM

## 2023-05-27 NOTE — Therapy (Signed)
OUTPATIENT PHYSICAL THERAPY TREATMENT    Patient Name: [name] MRN: [id number] DOB:13-Apr-1941, 82 y.o., male Today's Date: 05/27/2023  END OF SESSION:  PT End of Session - 05/27/23 1153     Visit Number 4    Number of Visits 16    Date for PT Re-Evaluation 07/08/23    Authorization Type [employer] Medicare    Authorization Time Period 04/29/23-07/22/23    Progress Note Due on Visit 10    PT Start Time 1146    PT Stop Time 1226    PT Time Calculation (min) 40 min    Equipment Utilized During Treatment Gait belt    Activity Tolerance Patient tolerated treatment well;No increased pain    Behavior During Therapy [facility] for tasks assessed/performed              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   a.) TTE 08/27/2022: Ao sinus 4.1, asc Ao 3.7, ST junction 3.3   Atherosclerotic PVD with intermittent claudication (HCC)    a.) s/p vascular intervention 03/26/2018:  post tibial and peroneal crosser athrectomy, PTA LEFT post tibial, SFA, and popliteal arteries; b.) s/p PTA 03/20/2023: RIGHT SFA and popliteal arteries   Bilateral carotid artery disease (HCC) 08/22/2022   a.) carotid doppler 08/22/2022: <50 LICA, 50-69% RICA   Blurry vision, bilateral    CAD (coronary artery disease) 09/21/2022   a.) cCTA 09/21/2022: 591 (57 %ile for age/sex/race matched control)   Chest pain    Chronic heart failure with preserved ejection fraction (HFpEF) (HCC)    a.) TTE 08/27/2022: EF >55%, aym LVH, mild LAE, triv AR/PR, mild MR/TR. mild AS, Ao root dilatation, G1DD   CKD (chronic kidney disease), stage III (HCC)    Gangrene of toe of right foot (HCC)    a.) RIGHT second toe   Heart murmur    History of bilateral cataract extraction 2020   HLD (hyperlipidemia)    Hypertension    Incomplete right bundle branch block (RBBB)    Long term current use of  antithrombotics/antiplatelets    a.) clopidogrel   Neuropathy    Palpitations    T2DM (type 2 diabetes mellitus) (HCC)    Vertigo    Wears dentures    Full upper and lower (loose)   Past Surgical History:  Procedure Laterality Date   AMPUTATION TOE Left 03/28/2018   Procedure: AMPUTATION TOE/ PARTIAL RAY RESECTION-LEFT 2ND AND 3RD;  Surgeon: [name], DPM;  Location: [facility];  Service: Podiatry;  Laterality: Left;   AMPUTATION TOE Right 04/16/2023   Procedure: AMPUTATION TOE;  Surgeon: [name], DPM;  Location: [facility];  Service: Orthopedics/Podiatry;  Laterality: Right;   CATARACT EXTRACTION W/PHACO Left 02/10/2019   Procedure: CATARACT EXTRACTION PHACO AND INTRAOCULAR LENS PLACEMENT (IOC)  LEFT DIABETIC;  Surgeon: Charles Crane, MD;  Location: [facility];  Service: Ophthalmology;  Laterality: Left;  Diabetic - oral meds   CATARACT EXTRACTION W/PHACO Right 04/20/2019   Procedure: CATARACT EXTRACTION PHACO AND INTRAOCULAR LENS PLACEMENT (IOC)  RIGHT DIABETIC;  Surgeon: Charles Crane, MD;  Location: [facility];  Service: Ophthalmology;  Laterality: Right;   FEMORAL-TIBIAL BYPASS GRAFT Right 04/11/2023   Procedure: BYPASS GRAFT FEMORAL-TIBIAL ARTERY ( POPLITEAL TO PERONEAL);  Surgeon: Charles Dills, MD;  Location: [facility];  Service: Vascular;  Laterality: Right;   HERNIA REPAIR  LOWER EXTREMITY ANGIOGRAPHY Left 03/26/2018   Procedure: Lower Extremity Angiography;  Surgeon: Charles Dills, MD;  Location: [facility];  Service: Cardiovascular;  Laterality: Left;   LOWER EXTREMITY ANGIOGRAPHY Right 03/20/2023   Procedure: Lower Extremity Angiography;  Surgeon: Charles Dills, MD;  Location: [facility];  Service: Cardiovascular;  Laterality: Right;   LOWER EXTREMITY INTERVENTION Right 05/03/2023   Procedure: LOWER EXTREMITY INTERVENTION;  Surgeon: Charles Dills, MD;  Location: [facility];  Service:  Cardiovascular;  Laterality: Right;  With Staple removal   Percutaneous transluminal angioplasty right superficial femoral artery      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   Procedure: TRANSMETATARSAL AMPUTATION;  Surgeon: [name], DPM;  Location: [facility];  Service: Podiatry;  Laterality: Left;   Patient Active Problem List   Diagnosis Date Noted   Postoperative seroma of subcutaneous tissue after non-dermatologic procedure 05/05/2023   AKI (acute kidney injury) (HCC) 05/03/2023   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) 04/11/2023   Chronic kidney disease, stage 3a (HCC) 03/19/2023   Chronic diastolic CHF (congestive heart failure) (HCC) 03/19/2023   PVD (peripheral vascular disease) (HCC) 03/19/2023   CAD (coronary artery disease) 03/19/2023   Gangrene of toe of right foot (HCC) 03/19/2023   Statin declined 05/18/2022   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   Atherosclerosis of native arteries of the extremities with ulceration (HCC) 04/28/2018   Diabetes (HCC) 04/28/2018   Essential hypertension 04/28/2018   Diabetic foot infection (HCC) 03/24/2018    PCP: Charles Corner, PA-C   REFERRING PROVIDER: Georgiana Spinner, NP   REFERRING DIAG: I70.25 (ICD-10-CM) - Atherosclerosis of native arteries of the extremities with ulceration (HCC)   THERAPY DIAG:  Difficulty in walking, not elsewhere classified  Muscle weakness (generalized)  Unsteadiness on feet  Rationale for Evaluation and Treatment: Rehabilitation  ONSET DATE: 04/11/2023 for Bypass graft and Toe amputation on 04/16/2023  SUBJECTIVE:   SUBJECTIVE STATEMENT: Pt reports he's done some of his HEP. He is walking some "but not much." Pt reports R foot pain wakes him up and night, but walking helps improve it. Pt reports he is taking antibiotics currently.  PERTINENT HISTORY: [name] is an 82yoM who is referred to OPPT for balance issues in the setting of recent hopsital  admission, Rt PopPer bypass RLE 04/11/23 and Rt 2nd toe amputation 04/16/23. Pt was DC to home with RW (unable to use due to space) and surgical shoe (wrong shoe but podiatry says 'probably fine') WBAT. Pt is using SPC for AMB out of house, furniture for in-home AMB, has not returned to AMB over soft surfaces (feeding his chickens). Pt has not had any falls in recent months, however has remote falls. Pt formerly limited also by visual impairments 2/2 cataracts.    PAIN:  Are you having pain? Yes: NPRS scale: 1/10 Pain location: R foot  Pain description: soreness,  Aggravating factors: laying down  Relieving factors: walking   Reports pain gets up to 5/10   PRECAUTIONS: Other: recent toe amputation. -Continue WBAT surgical shoe   RED FLAGS: None   WEIGHT BEARING RESTRICTIONS: No  FALLS:  Has patient fallen in last 6 months? No  LIVING ENVIRONMENT: Lives with: lives with their spouse Wife has health problems with back and "everything else" Lives in: Mobile home Stairs: Yes: External: 1 steps; none Has following equipment at home: Single point cane and Walker - 2 wheeled  OCCUPATION: retured   PLOF: Independent and reports that  Son has to help   PATIENT GOALS: improve safety with walking and strength in BLE so he can get out and feed the chickens.   NEXT MD VISIT: Dr Logan Bores 9/3   OBJECTIVE:    TODAY'S TREATMENT:                                                                                                                              DATE: 05/27/2023  Nustep lvl 2 (seat 7, UE 7) x 5 min. SPM 40s  Seated march 1x10 @ 2lbAW Seated LAQ 1x10  @ 2lbAW STS 10x Ambulation with SPC over red mat and 3 airex pads taped together c 2;lb AW (2 laps in rehab gym)   Seated march 1x10 @ 2lbAW Seated LAQ 1x10  @ 2lbAW STS 10x Ambulation with SPC over red mat and 3 airex pads taped together c 2;lb AW (2 laps in rehab gym) carrying 7lb weight RUE   Seated march 1x10 @ 2lbAW Seated LAQ  1x10  @ 2lbAW STS 10x Ambulation with SPC over red mat and 3 airex pads taped together c 2;lb AW (2 laps in rehab gym) carrying 7lb weight LUE      PATIENT EDUCATION:  Education details:Pt educated throughout session about proper posture and technique with exercises. Improved exercise technique, movement at target joints, use of target muscles after min to mod verbal, visual, tactile cues.  Person educated: Patient and DIL Education method: Explanation, Demonstration, Verbal cues, and Handouts Education comprehension: verbalized understanding, returned demonstration, and needs further education  HOME EXERCISE PROGRAM:  Access Code: [email or web address] URL: [email or web address] Date: 05/16/2023 Prepared by: Temple Pacini  Exercises - Seated Long Arc Quad  - 2-3 x daily - 5-7 x weekly - 3 sets - 10 reps - Seated March  - 2-3 x daily - 5-7 x weekly - 3 sets - 10 reps - Seated Heel Raise  - 2-3 x daily - 5-7 x weekly - 3 sets - 10 reps - Sit to Stand with Counter Support  - 2-3 x daily - 5-7 x weekly - 3 sets - 10 reps Rates interventions medium-hard. Cued to perform only in pain-free ranges/intensities  ASSESSMENT:  CLINICAL IMPRESSION: Continued to work on circuit based AMB program for dynamic balance and task reproduction. Pt fatigued after each intervention, but recovers well over 90sec seated. Pt responded well to all interventions without pain and with minimal fatigue. The pt will benefit from skilled PT to address strength,endurance and balance deficits to allow improved QoL and return to PLOF.   OBJECTIVE IMPAIRMENTS: Abnormal gait, decreased activity tolerance, decreased balance, decreased endurance, decreased knowledge of condition, decreased knowledge of use of DME, decreased mobility, difficulty walking, decreased strength, impaired perceived functional ability, and impaired sensation.   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   REHAB POTENTIAL: Good  CLINICAL DECISION MAKING: Stable/uncomplicated  EVALUATION COMPLEXITY: Moderate   GOALS: Goals reviewed with patient? Yes  SHORT TERM GOALS: Target date: 06/17/2023    Patient will be independent in home exercise program to improve strength/mobility for better functional independence with ADLs. Baseline: to be give at next visit Goal status: INITIAL   LONG TERM GOALS: Target date: 07/08/2023    Patient will increase FOTO score to equal to or greater than  62   to demonstrate statistically significant improvement in mobility and quality of life.  Baseline: 53 Goal status: INITIAL  2.  Patient (> 62 years old) will complete five times sit to stand test in < 15 seconds indicating an increased LE strength and improved balance. Baseline: 24.5 Goal status: INITIAL  3.  Patient will increase Berg Balance score by > 6 points to demonstrate decreased fall risk during functional activities Baseline: 39 Goal status: INITIAL  4.  Patient will increase 10 meter walk test to >.3m/s as to improve gait speed for better community ambulation and to reduce fall risk. Baseline: 0.51 Goal status: INITIAL  5.  Patient will reduce timed up and go to <15 seconds to reduce fall risk and demonstrate improved transfer/gait ability. Baseline: 30.3sec Goal status: INITIAL  6.  Patient will self report ability to feed chickens to indicate improve return to PLOF and reduced dependence on family for household chores  Baseline: unable to perform Goal status: INITIAL    PLAN:  PT FREQUENCY: 1-2x/week  PT DURATION: 8 weeks  PLANNED INTERVENTIONS: Therapeutic exercises, Therapeutic activity, Neuromuscular re-education, Balance training, Gait training,  Patient/Family education, Self Care, Joint mobilization, Stair training, Visual/preceptual remediation/compensation, Orthotic/Fit training, DME instructions, Moist heat, Compression bandaging, scar mobilization, Splintting, Taping, and Manual therapy  PLAN FOR NEXT SESSION:  Endurance, strength, balance   11:54 AM, 05/27/23 Rosamaria Lints, PT, DPT Physical Therapist -  [facility]  Outpatient Physical Therapy- Main [facility] [phone]

## 2023-05-29 ENCOUNTER — Other Ambulatory Visit (INDEPENDENT_AMBULATORY_CARE_PROVIDER_SITE_OTHER): Payer: Self-pay | Admitting: Nurse Practitioner

## 2023-05-29 ENCOUNTER — Encounter: Payer: Self-pay | Admitting: Podiatry

## 2023-05-29 DIAGNOSIS — Z9889 Other specified postprocedural states: Secondary | ICD-10-CM

## 2023-05-30 ENCOUNTER — Ambulatory Visit: Payer: Medicare Other

## 2023-05-30 DIAGNOSIS — R262 Difficulty in walking, not elsewhere classified: Secondary | ICD-10-CM | POA: Diagnosis not present

## 2023-05-30 DIAGNOSIS — M6281 Muscle weakness (generalized): Secondary | ICD-10-CM

## 2023-05-30 DIAGNOSIS — R2681 Unsteadiness on feet: Secondary | ICD-10-CM

## 2023-05-30 NOTE — Therapy (Signed)
OUTPATIENT PHYSICAL THERAPY TREATMENT    Patient Name: [name] MRN: [id number] DOB:02-Sep-1941, 82 y.o., male Today's Date: 05/30/2023  END OF SESSION:  PT End of Session - 05/30/23 1323     Visit Number 5    Number of Visits 16    Authorization Type [employer] Medicare    Authorization Time Period 04/29/23-07/22/23    Progress Note Due on Visit 10    PT Start Time 1315    PT Stop Time 1355    PT Time Calculation (min) 40 min    Equipment Utilized During Treatment Gait belt    Activity Tolerance Patient tolerated treatment well;No increased pain    Behavior During Therapy [facility] for tasks assessed/performed              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   a.) TTE 08/27/2022: Ao sinus 4.1, asc Ao 3.7, ST junction 3.3   Atherosclerotic PVD with intermittent claudication (HCC)    a.) s/p vascular intervention 03/26/2018:  post tibial and peroneal crosser athrectomy, PTA LEFT post tibial, SFA, and popliteal arteries; b.) s/p PTA 03/20/2023: RIGHT SFA and popliteal arteries   Bilateral carotid artery disease (HCC) 08/22/2022   a.) carotid doppler 08/22/2022: <50 LICA, 50-69% RICA   Blurry vision, bilateral    CAD (coronary artery disease) 09/21/2022   a.) cCTA 09/21/2022: 591 (57 %ile for age/sex/race matched control)   Chest pain    Chronic heart failure with preserved ejection fraction (HFpEF) (HCC)    a.) TTE 08/27/2022: EF >55%, aym LVH, mild LAE, triv AR/PR, mild MR/TR. mild AS, Ao root dilatation, G1DD   CKD (chronic kidney disease), stage III (HCC)    Gangrene of toe of right foot (HCC)    a.) RIGHT second toe   Heart murmur    History of bilateral cataract extraction 2020   HLD (hyperlipidemia)    Hypertension    Incomplete right bundle branch block (RBBB)    Long term current use of antithrombotics/antiplatelets    a.) clopidogrel    Neuropathy    Palpitations    T2DM (type 2 diabetes mellitus) (HCC)    Vertigo    Wears dentures    Full upper and lower (loose)   Past Surgical History:  Procedure Laterality Date   AMPUTATION TOE Left 03/28/2018   Procedure: AMPUTATION TOE/ PARTIAL RAY RESECTION-LEFT 2ND AND 3RD;  Surgeon: Linus Galas, DPM;  Location: [facility];  Service: Podiatry;  Laterality: Left;   AMPUTATION TOE Right 04/16/2023   Procedure: AMPUTATION TOE;  Surgeon: Felecia Shelling, DPM;  Location: [facility];  Service: Orthopedics/Podiatry;  Laterality: Right;   CATARACT EXTRACTION W/PHACO Left 02/10/2019   Procedure: CATARACT EXTRACTION PHACO AND INTRAOCULAR LENS PLACEMENT (IOC)  LEFT DIABETIC;  Surgeon: Nevada Crane, MD;  Location: [facility];  Service: Ophthalmology;  Laterality: Left;  Diabetic - oral meds   CATARACT EXTRACTION W/PHACO Right 04/20/2019   Procedure: CATARACT EXTRACTION PHACO AND INTRAOCULAR LENS PLACEMENT (IOC)  RIGHT DIABETIC;  Surgeon: Nevada Crane, MD;  Location: [facility];  Service: Ophthalmology;  Laterality: Right;   FEMORAL-TIBIAL BYPASS GRAFT Right 04/11/2023   Procedure: BYPASS GRAFT FEMORAL-TIBIAL ARTERY ( POPLITEAL TO PERONEAL);  Surgeon: Renford Dills, MD;  Location: [facility];  Service: Vascular;  Laterality: Right;   HERNIA REPAIR     LOWER EXTREMITY ANGIOGRAPHY Left 03/26/2018  Procedure: Lower Extremity Angiography;  Surgeon: Renford Dills, MD;  Location: [facility];  Service: Cardiovascular;  Laterality: Left;   LOWER EXTREMITY ANGIOGRAPHY Right 03/20/2023   Procedure: Lower Extremity Angiography;  Surgeon: Renford Dills, MD;  Location: [facility];  Service: Cardiovascular;  Laterality: Right;   LOWER EXTREMITY INTERVENTION Right 05/03/2023   Procedure: LOWER EXTREMITY INTERVENTION;  Surgeon: Renford Dills, MD;  Location: [facility];  Service: Cardiovascular;  Laterality: Right;  With Staple removal    Percutaneous transluminal angioplasty right superficial femoral artery      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   Procedure: TRANSMETATARSAL AMPUTATION;  Surgeon: Recardo Evangelist, DPM;  Location: [facility];  Service: Podiatry;  Laterality: Left;     PCP: Wilford Corner, PA-C  REFERRING PROVIDER: Georgiana Spinner, NP  REFERRING DIAG: I70.25 (ICD-10-CM) - Atherosclerosis of native arteries of the extremities with ulceration (HCC)   THERAPY DIAG:  Difficulty in walking, not elsewhere classified  Muscle weakness (generalized)  Unsteadiness on feet  Rationale for Evaluation and Treatment: Rehabilitation  ONSET DATE: 04/11/2023 for Bypass graft and Toe amputation on 04/16/2023  SUBJECTIVE:   SUBJECTIVE STATEMENT: Doing well. Fed the [employer] yesterday by himself, using his cane. Working on [employer] as well.   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 Pt was DC to home with RW (unable to use due to space) and surgical shoe (wrong shoe but podiatry says 'probably fine') WBAT. Pt is using SPC for AMB out of house, furniture for in-home AMB, has not returned to AMB over soft surfaces (feeding his chickens). Pt has not had any falls in recent months, however has remote falls. Pt formerly limited also by visual impairments 2/2 cataracts.    PAIN:  Are you having pain? Yes, a little 2/10 foot   PRECAUTIONS: Other: recent toe amputation. -Continue WBAT surgical shoe    WEIGHT BEARING RESTRICTIONS: No  FALLS:  Has patient fallen in last 6 months? No  LIVING ENVIRONMENT: Lives with: lives with their spouse Wife has health problems with back and "everything else" Lives in: Mobile home Stairs: Yes: External: 1 steps; none Has following equipment at home: Single point cane and Walker - 2 wheeled  OCCUPATION: retured   PLOF:  Independent and reports that Son has to help   PATIENT GOALS: improve safety with walking and strength in BLE so he can get out and feed the chickens.   NEXT MD VISIT: Dr Logan Bores 9/3   OBJECTIVE:    TODAY'S TREATMENT:                                                                                                                               05/30/2023  -Nustep lvl 2 (seat 7, UE 7) x 6 min. SPM 40s -  STS from 2x10  -8" step taps  -tandem stance balance 8x20sec bilat -foam stance balance x2 miutes Narro stance x1 minutes Eyes closed x 60sec  360* turns 5x bilat  90 degree head turns x10 bialt     PATIENT EDUCATION:  Education details:cues for accuracy on exercises and dialing in desired intensity   HOME EXERCISE PROGRAM:  Access Code: [email or web address] URL: [email or web address] Date: 05/16/2023 Prepared by: Temple Pacini  Exercises - Seated Long Arc Quad  - 2-3 x daily - 5-7 x weekly - 3 sets - 10 reps - Seated March  - 2-3 x daily - 5-7 x weekly - 3 sets - 10 reps - Seated Heel Raise  - 2-3 x daily - 5-7 x weekly - 3 sets - 10 reps - Sit to Stand with Counter Support  - 2-3 x daily - 5-7 x weekly - 3 sets - 10 reps *Cued to perform only in pain-free ranges/intensities  ASSESSMENT:  CLINICAL IMPRESSION: Continued to work on circuit based AMB program for dynamic balance and task reproduction. Pt improving in general, improvements seen each visit. Pt continues to progressive  The pt will benefit from skilled PT to address strength,endurance and balance deficits to allow improved QoL and return to PLOF.   OBJECTIVE IMPAIRMENTS: Abnormal gait, decreased activity tolerance, decreased balance, decreased endurance, decreased knowledge of condition, decreased knowledge of use of DME, decreased mobility, difficulty walking, decreased strength, impaired perceived functional ability, and impaired sensation.   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   REHAB POTENTIAL: Good  CLINICAL DECISION MAKING: Stable/uncomplicated  EVALUATION COMPLEXITY: Moderate   GOALS: Goals reviewed with patient? Yes  SHORT TERM GOALS: Target date: 06/17/2023  Patient will be independent in home exercise program to improve strength/mobility for better functional independence with ADLs. Baseline: to be give at next visit Goal status: INITIAL   LONG TERM GOALS: Target date: 07/08/2023  Patient will increase FOTO score to equal to or greater than  62   to demonstrate statistically significant improvement in mobility and quality of life.  Baseline: 53 Goal status: INITIAL  2.  Patient (> 45 years old) will complete five times sit to stand test in < 15 seconds indicating an increased LE strength and improved balance. Baseline: 24.5 Goal status: INITIAL  3.  Patient will increase Berg Balance score by > 6 points to demonstrate decreased fall risk during functional activities Baseline: 39 Goal status: INITIAL  4.  Patient will increase 10 meter walk test to >.34m/s as to improve gait speed for better community ambulation and to reduce fall risk. Baseline: 0.51 Goal status: INITIAL  5.  Patient will reduce timed up and go to <15 seconds to reduce fall risk and demonstrate improved transfer/gait ability. Baseline: 30.3sec Goal status: INITIAL  6.  Patient will self report ability to feed chickens to indicate improve return to PLOF and reduced dependence on family for household chores Baseline: unable to perform Goal status: INITIAL   PLAN:  PT FREQUENCY: 1-2x/week  PT DURATION: 8 weeks  PLANNED INTERVENTIONS: Therapeutic exercises, Therapeutic activity, Neuromuscular re-education, Balance training, Gait  training, Patient/Family education, Self Care, Joint mobilization, Stair training, Visual/preceptual remediation/compensation, Orthotic/Fit training, DME instructions, Moist heat, Compression bandaging, scar mobilization, Splintting, Taping, and Manual therapy  PLAN FOR NEXT SESSION:  Endurance, strength, balance  1:24 PM, 05/30/23 Rosamaria Lints, PT, DPT Physical Therapist - [facility]  Outpatient Physical Therapy- Main Campus [phone]

## 2023-06-02 NOTE — Progress Notes (Unsigned)
Patient ID: [name], male   DOB: 07-Sep-1941, 82 y.o.   MRN: [id number]  No chief complaint on file.   HPI [name] is a 82 y.o. male.  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   a.) TTE 08/27/2022: Ao sinus 4.1, asc Ao 3.7, ST junction 3.3   Atherosclerotic PVD with intermittent claudication (HCC)    a.) s/p vascular intervention 03/26/2018:  post tibial and peroneal crosser athrectomy, PTA LEFT post tibial, SFA, and popliteal arteries; b.) s/p PTA 03/20/2023: RIGHT SFA and popliteal arteries   Bilateral carotid artery disease (HCC) 08/22/2022   a.) carotid doppler 08/22/2022: <50 LICA, 50-69% RICA   Blurry vision, bilateral    CAD (coronary artery disease) 09/21/2022   a.) cCTA 09/21/2022: 591 (57 %ile for age/sex/race matched control)   Chest pain    Chronic heart failure with preserved ejection fraction (HFpEF) (HCC)    a.) TTE 08/27/2022: EF >55%, aym LVH, mild LAE, triv AR/PR, mild MR/TR. mild AS, Ao root dilatation, G1DD   CKD (chronic kidney disease), stage III (HCC)    Gangrene of toe of right foot (HCC)    a.) RIGHT second toe   Heart murmur    History of bilateral cataract extraction 2020   HLD (hyperlipidemia)    Hypertension    Incomplete right bundle branch block (RBBB)    Long term current use of antithrombotics/antiplatelets    a.) clopidogrel   Neuropathy    Palpitations    T2DM (type 2 diabetes mellitus) (HCC)    Vertigo    Wears dentures    Full upper and lower (loose)    Past Surgical History:  Procedure Laterality Date   AMPUTATION TOE Left 03/28/2018   Procedure: AMPUTATION TOE/ PARTIAL RAY RESECTION-LEFT 2ND AND 3RD;  Surgeon: Linus Galas, DPM;  Location: [facility];  Service: Podiatry;  Laterality: Left;   AMPUTATION TOE Right 04/16/2023   Procedure: AMPUTATION TOE;  Surgeon: Felecia Shelling, DPM;   Location: [facility];  Service: Orthopedics/Podiatry;  Laterality: Right;   CATARACT EXTRACTION W/PHACO Left 02/10/2019   Procedure: CATARACT EXTRACTION PHACO AND INTRAOCULAR LENS PLACEMENT (IOC)  LEFT DIABETIC;  Surgeon: Nevada Crane, MD;  Location: [facility];  Service: Ophthalmology;  Laterality: Left;  Diabetic - oral meds   CATARACT EXTRACTION W/PHACO Right 04/20/2019   Procedure: CATARACT EXTRACTION PHACO AND INTRAOCULAR LENS PLACEMENT (IOC)  RIGHT DIABETIC;  Surgeon: Nevada Crane, MD;  Location: [facility];  Service: Ophthalmology;  Laterality: Right;   FEMORAL-TIBIAL BYPASS GRAFT Right 04/11/2023   Procedure: BYPASS GRAFT FEMORAL-TIBIAL ARTERY ( POPLITEAL TO PERONEAL);  Surgeon: Renford Dills, MD;  Location: [facility];  Service: Vascular;  Laterality: Right;   HERNIA REPAIR     LOWER EXTREMITY ANGIOGRAPHY Left 03/26/2018   Procedure: Lower Extremity Angiography;  Surgeon: Renford Dills, MD;  Location: [facility];  Service: Cardiovascular;  Laterality: Left;   LOWER EXTREMITY ANGIOGRAPHY Right 03/20/2023   Procedure: Lower Extremity Angiography;  Surgeon: Renford Dills, MD;  Location: [facility];  Service: Cardiovascular;  Laterality: Right;   LOWER EXTREMITY INTERVENTION Right 05/03/2023   Procedure: LOWER EXTREMITY INTERVENTION;  Surgeon: Renford Dills, MD;  Location: [facility];  Service: Cardiovascular;  Laterality: Right;  With Staple removal   Percutaneous transluminal angioplasty right  superficial femoral artery      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   Procedure: TRANSMETATARSAL AMPUTATION;  Surgeon: Recardo Evangelist, DPM;  Location: [facility];  Service: Podiatry;  Laterality: Left;      Allergies  Allergen Reactions   Haemophilus B Polysaccharide Vaccine Nausea And Vomiting and Other (See Comments)    Fever, chills, vomiting.   Influenza Virus Vaccine Other (See Comments) and Nausea And Vomiting     Fever, chills, vomiting.     Current Outpatient Medications  Medication Sig Dispense Refill   acetaminophen (TYLENOL) 325 MG tablet Take 2 tablets (650 mg total) by mouth every 6 (six) hours as needed for mild pain or fever. 20 tablet 0   aspirin EC 81 MG tablet Take 1 tablet (81 mg total) by mouth daily at 6 (six) AM. Swallow whole. 30 tablet 12   clopidogrel (PLAVIX) 75 MG tablet Take 75 mg by mouth daily.     docusate sodium (COLACE) 100 MG capsule Take 1 capsule (100 mg total) by mouth daily. 10 capsule 0   doxycycline (VIBRA-TABS) 100 MG tablet Take 1 tablet (100 mg total) by mouth 2 (two) times daily. 20 tablet 0   metoprolol tartrate (LOPRESSOR) 25 MG tablet Take tablet TWO hours prior to his cardiac CT scan. 30 tablet 0   Multiple Vitamin (MULTIVITAMIN) tablet Take 1 tablet by mouth 3 (three) times a week.     oxyCODONE-acetaminophen (PERCOCET/ROXICET) 5-325 MG tablet Take 1-2 tablets by mouth every 4 (four) hours as needed for moderate pain. 30 tablet 0   rosuvastatin (CRESTOR) 10 MG tablet Take 1 tablet (10 mg total) by mouth daily. 30 tablet 11   valsartan (DIOVAN) 80 MG tablet Take 1 tablet (80 mg total) by mouth daily. 30 tablet 0   No current facility-administered medications for this visit.        Physical Exam There were no vitals taken for this visit. Gen:  WD/WN, NAD Skin: incision C/D/I     Assessment/Plan:  No problem-specific Assessment & Plan notes found for this encounter.      [name] 06/02/2023, 3:29 PM   This note was created with Dragon medical transcription system.  Any errors from dictation are unintentional.

## 2023-06-03 ENCOUNTER — Ambulatory Visit (INDEPENDENT_AMBULATORY_CARE_PROVIDER_SITE_OTHER): Payer: Medicare Other

## 2023-06-03 ENCOUNTER — Ambulatory Visit (INDEPENDENT_AMBULATORY_CARE_PROVIDER_SITE_OTHER): Payer: Medicare Other | Admitting: Vascular Surgery

## 2023-06-03 ENCOUNTER — Ambulatory Visit: Payer: Medicare Other

## 2023-06-03 ENCOUNTER — Encounter (INDEPENDENT_AMBULATORY_CARE_PROVIDER_SITE_OTHER): Payer: Self-pay | Admitting: Vascular Surgery

## 2023-06-03 VITALS — BP 182/89 | HR 61 | Resp 18 | Ht 68.0 in | Wt 168.2 lb

## 2023-06-03 DIAGNOSIS — Z9889 Other specified postprocedural states: Secondary | ICD-10-CM

## 2023-06-03 DIAGNOSIS — M6281 Muscle weakness (generalized): Secondary | ICD-10-CM

## 2023-06-03 DIAGNOSIS — R262 Difficulty in walking, not elsewhere classified: Secondary | ICD-10-CM

## 2023-06-03 DIAGNOSIS — I739 Peripheral vascular disease, unspecified: Secondary | ICD-10-CM

## 2023-06-03 DIAGNOSIS — R2681 Unsteadiness on feet: Secondary | ICD-10-CM

## 2023-06-03 DIAGNOSIS — I7025 Atherosclerosis of native arteries of other extremities with ulceration: Secondary | ICD-10-CM

## 2023-06-03 NOTE — Therapy (Signed)
OUTPATIENT PHYSICAL THERAPY TREATMENT    Patient Name: [name] MRN: [id number] DOB:1940-10-27, 82 y.o., male Today's Date: 06/03/2023  END OF SESSION:  PT End of Session - 06/03/23 0943     Visit Number 6    Number of Visits 16    Date for PT Re-Evaluation 07/08/23    Authorization Type [employer] Medicare    Authorization Time Period 04/29/23-07/22/23    Progress Note Due on Visit 10    PT Start Time 0935    PT Stop Time 1014    PT Time Calculation (min) 39 min    Equipment Utilized During Treatment Gait belt    Activity Tolerance Patient tolerated treatment well;No increased pain    Behavior During Therapy [name] for tasks assessed/performed              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   a.) TTE 08/27/2022: Ao sinus 4.1, asc Ao 3.7, ST junction 3.3   Atherosclerotic PVD with intermittent claudication (HCC)    a.) s/p vascular intervention 03/26/2018:  post tibial and peroneal crosser athrectomy, PTA LEFT post tibial, SFA, and popliteal arteries; b.) s/p PTA 03/20/2023: RIGHT SFA and popliteal arteries   Bilateral carotid artery disease (HCC) 08/22/2022   a.) carotid doppler 08/22/2022: <50 LICA, 50-69% RICA   Blurry vision, bilateral    CAD (coronary artery disease) 09/21/2022   a.) cCTA 09/21/2022: 591 (57 %ile for age/sex/race matched control)   Chest pain    Chronic heart failure with preserved ejection fraction (HFpEF) (HCC)    a.) TTE 08/27/2022: EF >55%, aym LVH, mild LAE, triv AR/PR, mild MR/TR. mild AS, Ao root dilatation, G1DD   CKD (chronic kidney disease), stage III (HCC)    Gangrene of toe of right foot (HCC)    a.) RIGHT second toe   Heart murmur    History of bilateral cataract extraction 2020   HLD (hyperlipidemia)    Hypertension    Incomplete right bundle branch block (RBBB)    Long term current use of  antithrombotics/antiplatelets    a.) clopidogrel   Neuropathy    Palpitations    T2DM (type 2 diabetes mellitus) (HCC)    Vertigo    Wears dentures    Full upper and lower (loose)   Past Surgical History:  Procedure Laterality Date   AMPUTATION TOE Left 03/28/2018   Procedure: AMPUTATION TOE/ PARTIAL RAY RESECTION-LEFT 2ND AND 3RD;  Surgeon: [name], DPM;  Location: [facility];  Service: Podiatry;  Laterality: Left;   AMPUTATION TOE Right 04/16/2023   Procedure: AMPUTATION TOE;  Surgeon: [name], DPM;  Location: [facility];  Service: Orthopedics/Podiatry;  Laterality: Right;   CATARACT EXTRACTION W/PHACO Left 02/10/2019   Procedure: CATARACT EXTRACTION PHACO AND INTRAOCULAR LENS PLACEMENT (IOC)  LEFT DIABETIC;  Surgeon: Charles Crane, MD;  Location: [facility];  Service: Ophthalmology;  Laterality: Left;  Diabetic - oral meds   CATARACT EXTRACTION W/PHACO Right 04/20/2019   Procedure: CATARACT EXTRACTION PHACO AND INTRAOCULAR LENS PLACEMENT (IOC)  RIGHT DIABETIC;  Surgeon: Charles Crane, MD;  Location: [facility];  Service: Ophthalmology;  Laterality: Right;   FEMORAL-TIBIAL BYPASS GRAFT Right 04/11/2023   Procedure: BYPASS GRAFT FEMORAL-TIBIAL ARTERY ( POPLITEAL TO PERONEAL);  Surgeon: Charles Dills, MD;  Location: [facility];  Service: Vascular;  Laterality: Right;   HERNIA REPAIR  LOWER EXTREMITY ANGIOGRAPHY Left 03/26/2018   Procedure: Lower Extremity Angiography;  Surgeon: Charles Dills, MD;  Location: [facility];  Service: Cardiovascular;  Laterality: Left;   LOWER EXTREMITY ANGIOGRAPHY Right 03/20/2023   Procedure: Lower Extremity Angiography;  Surgeon: Charles Dills, MD;  Location: [facility];  Service: Cardiovascular;  Laterality: Right;   LOWER EXTREMITY INTERVENTION Right 05/03/2023   Procedure: LOWER EXTREMITY INTERVENTION;  Surgeon: Charles Dills, MD;  Location: [facility];  Service:  Cardiovascular;  Laterality: Right;  With Staple removal   Percutaneous transluminal angioplasty right superficial femoral artery      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   Procedure: TRANSMETATARSAL AMPUTATION;  Surgeon: [name], DPM;  Location: [facility];  Service: Podiatry;  Laterality: Left;     PCP: Charles Corner, PA-C  REFERRING PROVIDER: Georgiana Spinner, NP  REFERRING DIAG: I70.25 (ICD-10-CM) - Atherosclerosis of native arteries of the extremities with ulceration (HCC)   THERAPY DIAG:  Difficulty in walking, not elsewhere classified  Muscle weakness (generalized)  Unsteadiness on feet  Rationale for Evaluation and Treatment: Rehabilitation  ONSET DATE: 04/11/2023 for Bypass graft and Toe amputation on 04/16/2023  SUBJECTIVE:   SUBJECTIVE STATEMENT: Doing well. Fed the [employer] yesterday by himself, using his cane. Working on [employer] as well.   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 Pt was DC to home with RW (unable to use due to space) and surgical shoe (wrong shoe but podiatry says 'probably fine') WBAT. Pt is using SPC for AMB out of house, furniture for in-home AMB, has not returned to AMB over soft surfaces (feeding his chickens). Pt has not had any falls in recent months, however has remote falls. Pt formerly limited also by visual impairments 2/2 cataracts.    PAIN:  Are you having pain? Yes, a little 2/10 foot   PRECAUTIONS: Other: recent toe amputation. -Continue WBAT surgical shoe    WEIGHT BEARING RESTRICTIONS: No  FALLS:  Has patient fallen in last 6 months? No  LIVING ENVIRONMENT: Lives with: lives with their spouse Wife has health problems with back and "everything else" Lives in: Mobile home Stairs: Yes: External: 1 steps; none Has following equipment at home: Single point  cane and Walker - 2 wheeled  OCCUPATION: retured   PLOF: Independent and reports that Son has to help   PATIENT GOALS: improve safety with walking and strength in BLE so he can get out and feed the chickens.   NEXT MD VISIT: Dr Logan Bores 9/3   OBJECTIVE:    TODAY'S TREATMENT:                                                                                                                               06/03/2023  -Nustep T4r (in wellzone) lvl  3 (seat 9, UE 9) x 8 min   Ciruit loop below x3  -STS x10 from chair then obstacle loop in gym (2 loops) aerobic step walk the plank, 41ft to 2" airex plank, then 78ft to red floor lava mat, and back around to start  *3lb AW, on knees, SPC for first attempt, then7lb weight in one hand to similar chicken feed     PATIENT EDUCATION:  Education details:cues for accuracy on exercises and dialing in desired intensity   HOME EXERCISE PROGRAM:  Access Code: [email or web address] URL: [email or web address] Date: 05/16/2023 Prepared by: Temple Pacini  Exercises - Seated Long Arc Quad  - 2-3 x daily - 5-7 x weekly - 3 sets - 10 reps - Seated March  - 2-3 x daily - 5-7 x weekly - 3 sets - 10 reps - Seated Heel Raise  - 2-3 x daily - 5-7 x weekly - 3 sets - 10 reps - Sit to Stand with Counter Support  - 2-3 x daily - 5-7 x weekly - 3 sets - 10 reps *Cued to perform only in pain-free ranges/intensities  ASSESSMENT:  CLINICAL IMPRESSION: Pt continues to make general progress in energy levels and strength, however session remains somewhat limited by continued foot heeling needs, avoiding excessive loading that would delay or complicate heeling. Continued to work on Physiological scientist based AMB program for dynamic balance and task reproduction. Pt improving in general, improvements seen each visit. Pt continues to progressive  The pt will benefit from skilled PT to address strength,endurance and balance deficits to allow improved QoL and return to PLOF.   OBJECTIVE  IMPAIRMENTS: Abnormal gait, decreased activity tolerance, decreased balance, decreased endurance, decreased knowledge of condition, decreased knowledge of use of DME, decreased mobility, difficulty walking, decreased strength, impaired perceived functional ability, and impaired sensation.   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   REHAB POTENTIAL: Good  CLINICAL DECISION MAKING: Stable/uncomplicated  EVALUATION COMPLEXITY: Moderate   GOALS: Goals reviewed with patient? Yes  SHORT TERM GOALS: Target date: 06/17/2023  Patient will be independent in home exercise program to improve strength/mobility for better functional independence with ADLs. Baseline: to be give at next visit Goal status: INITIAL   LONG TERM GOALS: Target date: 07/08/2023  Patient will increase FOTO score to equal to or greater than  62   to demonstrate statistically significant improvement in mobility and quality of life.  Baseline: 53 Goal status: INITIAL  2.  Patient (> 109 years old) will complete five times sit to stand test in < 15 seconds indicating an increased LE strength and improved balance. Baseline: 24.5 Goal status: INITIAL  3.  Patient will increase Berg Balance score by > 6 points to demonstrate decreased fall risk during functional activities Baseline: 39 Goal status: INITIAL  4.  Patient will increase 10 meter walk test to >.82m/s as to improve gait speed for better community ambulation and to reduce fall risk. Baseline: 0.51 Goal status: INITIAL  5.  Patient will reduce timed up and go to <15 seconds to reduce fall risk and demonstrate improved transfer/gait ability. Baseline: 30.3sec Goal status: INITIAL  6.  Patient will self  report ability to feed chickens to indicate improve return to PLOF and reduced dependence on family for household chores Baseline: unable to perform Goal status: INITIAL  PLAN:  PT FREQUENCY: 1-2x/week  PT DURATION: 8 weeks  PLANNED INTERVENTIONS: Therapeutic exercises, Therapeutic activity, Neuromuscular re-education, Balance training, Gait training, Patient/Family education, Self Care, Joint mobilization, Stair training, Visual/preceptual remediation/compensation, Orthotic/Fit training, DME instructions, Moist heat, Compression bandaging, scar mobilization, Splintting, Taping, and Manual therapy  PLAN FOR NEXT SESSION:  Endurance, strength, balance   9:44 AM, 06/03/23 Rosamaria Lints, PT, DPT Physical Therapist - [facility]  Outpatient Physical Therapy- Main Campus [phone]

## 2023-06-04 ENCOUNTER — Ambulatory Visit (INDEPENDENT_AMBULATORY_CARE_PROVIDER_SITE_OTHER): Payer: Medicare Other | Admitting: Podiatry

## 2023-06-04 ENCOUNTER — Encounter: Payer: Self-pay | Admitting: Podiatry

## 2023-06-04 VITALS — BP 163/66 | HR 70

## 2023-06-04 DIAGNOSIS — L97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 ulcer of other part of right foot with fat layer exposed: Secondary | ICD-10-CM

## 2023-06-04 DIAGNOSIS — I96 Gangrene, not elsewhere classified: Secondary | ICD-10-CM

## 2023-06-04 NOTE — Progress Notes (Signed)
Chief Complaint  Patient presents with   Routine Post Op    "I think it's doing a little better."    Subjective:  Patient presents today status post right second toe amputation inpatient.  DOS: 04/16/2023.  Patient was admitted for complications arising from prior popliteal peroneal artery bypass graft.  Presenting in the office today for outpatient follow-up regarding the toe amputation.  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   a.) TTE 08/27/2022: Ao sinus 4.1, asc Ao 3.7, ST junction 3.3   Atherosclerotic PVD with intermittent claudication (HCC)    a.) s/p vascular intervention 03/26/2018:  post tibial and peroneal crosser athrectomy, PTA LEFT post tibial, SFA, and popliteal arteries; b.) s/p PTA 03/20/2023: RIGHT SFA and popliteal arteries   Bilateral carotid artery disease (HCC) 08/22/2022   a.) carotid doppler 08/22/2022: <50 LICA, 50-69% RICA   Blurry vision, bilateral    CAD (coronary artery disease) 09/21/2022   a.) cCTA 09/21/2022: 591 (57 %ile for age/sex/race matched control)   Chest pain    Chronic heart failure with preserved ejection fraction (HFpEF) (HCC)    a.) TTE 08/27/2022: EF >55%, aym LVH, mild LAE, triv AR/PR, mild MR/TR. mild AS, Ao root dilatation, G1DD   CKD (chronic kidney disease), stage III (HCC)    Gangrene of toe of right foot (HCC)    a.) RIGHT second toe   Heart murmur    History of bilateral cataract extraction 2020   HLD (hyperlipidemia)    Hypertension    Incomplete right bundle branch block (RBBB)    Long term current use of antithrombotics/antiplatelets    a.) clopidogrel   Neuropathy    Palpitations    T2DM (type 2 diabetes mellitus) (HCC)    Vertigo    Wears dentures    Full upper and lower (loose)    Past Surgical History:  Procedure Laterality Date   AMPUTATION TOE Left 03/28/2018   Procedure: AMPUTATION  TOE/ PARTIAL RAY RESECTION-LEFT 2ND AND 3RD;  Surgeon: Linus Galas, DPM;  Location: [facility];  Service: Podiatry;  Laterality: Left;   AMPUTATION TOE Right 04/16/2023   Procedure: AMPUTATION TOE;  Surgeon: Felecia Shelling, DPM;  Location: [facility];  Service: Orthopedics/Podiatry;  Laterality: Right;   CATARACT EXTRACTION W/PHACO Left 02/10/2019   Procedure: CATARACT EXTRACTION PHACO AND INTRAOCULAR LENS PLACEMENT (IOC)  LEFT DIABETIC;  Surgeon: Nevada Crane, MD;  Location: [facility];  Service: Ophthalmology;  Laterality: Left;  Diabetic - oral meds   CATARACT EXTRACTION W/PHACO Right 04/20/2019   Procedure: CATARACT EXTRACTION PHACO AND INTRAOCULAR LENS PLACEMENT (IOC)  RIGHT DIABETIC;  Surgeon: Nevada Crane, MD;  Location: [facility];  Service: Ophthalmology;  Laterality: Right;   FEMORAL-TIBIAL BYPASS GRAFT Right 04/11/2023   Procedure: BYPASS GRAFT FEMORAL-TIBIAL ARTERY ( POPLITEAL TO PERONEAL);  Surgeon: Renford Dills, MD;  Location: [facility];  Service: Vascular;  Laterality: Right;   HERNIA REPAIR     LOWER EXTREMITY ANGIOGRAPHY Left 03/26/2018   Procedure: Lower Extremity Angiography;  Surgeon: Renford Dills, MD;  Location: [facility];  Service: Cardiovascular;  Laterality: Left;   LOWER EXTREMITY ANGIOGRAPHY Right 03/20/2023   Procedure: Lower Extremity Angiography;  Surgeon: Renford Dills, MD;  Location: [facility];  Service: Cardiovascular;  Laterality: Right;   LOWER EXTREMITY INTERVENTION Right 05/03/2023   Procedure: LOWER EXTREMITY  INTERVENTION;  Surgeon: Renford Dills, MD;  Location: [facility];  Service: Cardiovascular;  Laterality: Right;  With Staple removal   Percutaneous transluminal angioplasty right superficial femoral artery      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   Procedure: TRANSMETATARSAL AMPUTATION;  Surgeon: Recardo Evangelist, DPM;  Location: [facility];  Service: Podiatry;  Laterality:  Left;    Allergies  Allergen Reactions   Haemophilus B Polysaccharide Vaccine Nausea And Vomiting and Other (See Comments)    Fever, chills, vomiting.   Influenza Virus Vaccine Other (See Comments) and Nausea And Vomiting    Fever, chills, vomiting.     RT second toe amputation site 05/21/2023  Objective/Physical Exam Stable.  Mostly unchanged.  Skin is warm to touch. Persistant dehiscence along the amputation site with extensive fibrotic debris at the amputation wound bed.  No malodor.  Slight serous drainage.  No active bleeding. The ulcer measures approximately 1.5 x 1.5 x 1.5 cm.  There is exposed deeper muscle and fascial tissue.  There is no exposed bone at the moment.  No malodor.  Clinically it appears stable with no acute infection.  Minimal sanguinous drainage with debridement.  ABI Findings:  +--------+------------------+-----+----------+--------+  Right  Rt Pressure (mmHg)IndexWaveform  Comment   +--------+------------------+-----+----------+--------+  [id number]                                       +--------+------------------+-----+----------+--------+  PTA    84                0.47 monophasic          +--------+------------------+-----+----------+--------+  PERO   109               0.61 monophasic          +--------+------------------+-----+----------+--------+   +---------+------------------+-----+-------------------+--------+  Left    Lt Pressure (mmHg)IndexWaveform           Comment   +---------+------------------+-----+-------------------+--------+  Brachial 178                                                 +---------+------------------+-----+-------------------+--------+  ATA     67                0.38 dampened monophasic          +---------+------------------+-----+-------------------+--------+  PTA     120               0.67 monophasic                    +---------+------------------+-----+-------------------+--------+  PERO    93                0.52 monophasic                   +---------+------------------+-----+-------------------+--------+  Great Toe                                          1/2 Foot  +---------+------------------+-----+-------------------+--------+   +-------+-----------+-----------+------------+------------+  ABI/TBIToday's ABIToday's TBIPrevious ABIPrevious TBI  +-------+-----------+-----------+------------+------------+  Right .61                   1.14                      +-------+-----------+-----------+------------+------------+  Left  .67                   .91                       +-------+-----------+-----------+------------+------------+  Bilateral ABIs appear decreased compared to prior study on 11/03/2018.    Summary:  Right: Resting right ankle-brachial index indicates moderate right lower extremity arterial disease.  Left: Resting left ankle-brachial index indicates moderate left lower  extremity arterial disease.   Radiographic Exam RT foot 05/07/2023:  Interval amputation of the right second toe at the level of the MTP.  Stable.  No erosions that would be concerning for osteomyelitis  Assessment: 1. s/p amputation right second toe inpatient. DOS: 04/16/2023 2.  S/p popliteal-peroneal artery bypass graft.  DOS: 04/11/2023.  Dr. Gilda Crease, [facility] 3. 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  -Medically necessary excisional debridement including muscle and deep fascial tissue was performed today using a tissue nipper.  Excisional debridement of the necrotic nonviable tissue down to healthier bleeding viable tissue was performed with postdebridement measurement same as pre- -Continue close management with [facility].  Patient seen today by vascular.  Stable. -Continue Betadine wet-to-dry dressings daily -Continue WBAT surgical shoe -  Unfortunately due to the location of the ulcer I think there may be issues getting a good seal in between the toes with a wound VAC.  I do believe the patient would benefit from wound care treatment and management. -Referral placed for [facility] wound care center.  Always appreciated.  Until that appointment continue Betadine wet-to-dry dressings -Return to clinic 3 months  Felecia Shelling, DPM [facility]  Dr. Felecia Shelling, DPM    [address] [place], [place] [address]                Office [phone]  Fax [phone]

## 2023-06-06 ENCOUNTER — Ambulatory Visit: Payer: Medicare Other

## 2023-06-06 ENCOUNTER — Telehealth: Payer: Self-pay

## 2023-06-06 DIAGNOSIS — R262 Difficulty in walking, not elsewhere classified: Secondary | ICD-10-CM

## 2023-06-06 DIAGNOSIS — M6281 Muscle weakness (generalized): Secondary | ICD-10-CM

## 2023-06-06 DIAGNOSIS — R2681 Unsteadiness on feet: Secondary | ICD-10-CM

## 2023-06-06 NOTE — Therapy (Signed)
OUTPATIENT PHYSICAL THERAPY TREATMENT    Patient Name: [name] MRN: [id number] DOB:1940/10/09, 82 y.o., male Today's Date: 06/06/2023  END OF SESSION:  PT End of Session - 06/06/23 1324     Visit Number 7    Number of Visits 16    Date for PT Re-Evaluation 07/08/23    Authorization Type [employer] Medicare    Authorization Time Period 04/29/23-07/22/23    Progress Note Due on Visit 10    PT Start Time 1317    PT Stop Time 1357    PT Time Calculation (min) 40 min    Equipment Utilized During Treatment Gait belt    Activity Tolerance Patient tolerated treatment well;No increased pain    Behavior During Therapy [facility] for tasks assessed/performed              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   a.) TTE 08/27/2022: Ao sinus 4.1, asc Ao 3.7, ST junction 3.3   Atherosclerotic PVD with intermittent claudication (HCC)    a.) s/p vascular intervention 03/26/2018:  post tibial and peroneal crosser athrectomy, PTA LEFT post tibial, SFA, and popliteal arteries; b.) s/p PTA 03/20/2023: RIGHT SFA and popliteal arteries   Bilateral carotid artery disease (HCC) 08/22/2022   a.) carotid doppler 08/22/2022: <50 LICA, 50-69% RICA   Blurry vision, bilateral    CAD (coronary artery disease) 09/21/2022   a.) cCTA 09/21/2022: 591 (57 %ile for age/sex/race matched control)   Chest pain    Chronic heart failure with preserved ejection fraction (HFpEF) (HCC)    a.) TTE 08/27/2022: EF >55%, aym LVH, mild LAE, triv AR/PR, mild MR/TR. mild AS, Ao root dilatation, G1DD   CKD (chronic kidney disease), stage III (HCC)    Gangrene of toe of right foot (HCC)    a.) RIGHT second toe   Heart murmur    History of bilateral cataract extraction 2020   HLD (hyperlipidemia)    Hypertension    Incomplete right bundle branch block (RBBB)    Long term current use of  antithrombotics/antiplatelets    a.) clopidogrel   Neuropathy    Palpitations    T2DM (type 2 diabetes mellitus) (HCC)    Vertigo    Wears dentures    Full upper and lower (loose)   Past Surgical History:  Procedure Laterality Date   AMPUTATION TOE Left 03/28/2018   Procedure: AMPUTATION TOE/ PARTIAL RAY RESECTION-LEFT 2ND AND 3RD;  Surgeon: Linus Galas, DPM;  Location: [facility];  Service: Podiatry;  Laterality: Left;   AMPUTATION TOE Right 04/16/2023   Procedure: AMPUTATION TOE;  Surgeon: Felecia Shelling, DPM;  Location: [facility];  Service: Orthopedics/Podiatry;  Laterality: Right;   CATARACT EXTRACTION W/PHACO Left 02/10/2019   Procedure: CATARACT EXTRACTION PHACO AND INTRAOCULAR LENS PLACEMENT (IOC)  LEFT DIABETIC;  Surgeon: Nevada Crane, MD;  Location: [facility];  Service: Ophthalmology;  Laterality: Left;  Diabetic - oral meds   CATARACT EXTRACTION W/PHACO Right 04/20/2019   Procedure: CATARACT EXTRACTION PHACO AND INTRAOCULAR LENS PLACEMENT (IOC)  RIGHT DIABETIC;  Surgeon: Nevada Crane, MD;  Location: [facility];  Service: Ophthalmology;  Laterality: Right;   FEMORAL-TIBIAL BYPASS GRAFT Right 04/11/2023   Procedure: BYPASS GRAFT FEMORAL-TIBIAL ARTERY ( POPLITEAL TO PERONEAL);  Surgeon: Renford Dills, MD;  Location: [facility];  Service: Vascular;  Laterality: Right;   HERNIA REPAIR  LOWER EXTREMITY ANGIOGRAPHY Left 03/26/2018   Procedure: Lower Extremity Angiography;  Surgeon: Renford Dills, MD;  Location: [facility];  Service: Cardiovascular;  Laterality: Left;   LOWER EXTREMITY ANGIOGRAPHY Right 03/20/2023   Procedure: Lower Extremity Angiography;  Surgeon: Renford Dills, MD;  Location: [facility];  Service: Cardiovascular;  Laterality: Right;   LOWER EXTREMITY INTERVENTION Right 05/03/2023   Procedure: LOWER EXTREMITY INTERVENTION;  Surgeon: Renford Dills, MD;  Location: [facility];  Service:  Cardiovascular;  Laterality: Right;  With Staple removal   Percutaneous transluminal angioplasty right superficial femoral artery      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   Procedure: TRANSMETATARSAL AMPUTATION;  Surgeon: Recardo Evangelist, DPM;  Location: [facility];  Service: Podiatry;  Laterality: Left;     PCP: Wilford Corner, PA-C  REFERRING PROVIDER: Georgiana Spinner, NP  REFERRING DIAG: I70.25 (ICD-10-CM) - Atherosclerosis of native arteries of the extremities with ulceration (HCC)   THERAPY DIAG:  Difficulty in walking, not elsewhere classified  Muscle weakness (generalized)  Unsteadiness on feet  Rationale for Evaluation and Treatment: Rehabilitation  ONSET DATE: 04/11/2023 for Bypass graft and Toe amputation on 04/16/2023  SUBJECTIVE:   SUBJECTIVE STATEMENT: Doing well today, but bummed out by being referred to wound care center after his recent podiatry FU. Despite this, podiatry says his wound looks good, just slow to heal. Pt saw vascualr surgery 2 days prior, will FU in 6 months now.   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 Pt was DC to home with RW (unable to use due to space) and surgical shoe (wrong shoe but podiatry says 'probably fine') WBAT. Pt is using SPC for AMB out of house, furniture for in-home AMB, has not returned to AMB over soft surfaces (feeding his chickens). Pt has not had any falls in recent months, however has remote falls. Pt formerly limited also by visual impairments 2/2 cataracts.    PAIN:  Are you having pain? Yes, a little 2/10 foot   PRECAUTIONS: Other: recent toe amputation. -Continue WBAT surgical shoe    WEIGHT BEARING RESTRICTIONS: No  FALLS:  Has patient fallen in last 6 months? No  LIVING ENVIRONMENT: Lives with: lives with their spouse Wife has health problems  with back and "everything else" Lives in: Mobile home Stairs: Yes: External: 1 steps; none Has following equipment at home: Single point cane and Walker - 2 wheeled  OCCUPATION: retured   PLOF: Independent and reports that Son has to help   PATIENT GOALS: improve safety with walking and strength in BLE so he can get out and feed the chickens.   NEXT MD VISIT: Dr Logan Bores 9/3   OBJECTIVE:    TODAY'S TREATMENT:  06/06/2023  -Nustep T4r (in wellzone) lvl 4 (seat 9, UE 8) x 10 min  -STS from chair hands free x10  -AMB 10ft fwd, 54ft back, 44ft fwd, 75ft back (wth red band around knees)  -STS from chair hugging 5kg ball x10  -AMB 14ft fwd, 62ft back, 26ft fwd, 50ft back (wth red band around knees)  with falls mat and SPC in path -AMB in clinic c 8lb weights bilat 1x171ft    06/03/23 -Nustep T4r (in wellzone) lvl 3 (seat 9, UE 9) x 8 min   Ciruit loop below x3  -STS x10 from chair then obstacle loop in gym (2 loops) aerobic step walk the plank, 66ft to 2" airex plank, then 50ft to red floor lava mat, and back around to start  *3lb AW, on knees, SPC for first attempt, then7lb weight in one hand to similar chicken feed     PATIENT EDUCATION:  Education details:cues for accuracy on exercises and dialing in desired intensity   HOME EXERCISE PROGRAM:  Access Code: [email or web address] URL: [email or web address] Date: 05/16/2023 Prepared by: Temple Pacini  Exercises - Seated Long Arc Quad  - 2-3 x daily - 5-7 x weekly - 3 sets - 10 reps - Seated March  - 2-3 x daily - 5-7 x weekly - 3 sets - 10 reps - Seated Heel Raise  - 2-3 x daily - 5-7 x weekly - 3 sets - 10 reps - Sit to Stand with Counter Support  - 2-3 x daily - 5-7 x weekly - 3 sets - 10 reps *Cued to perform only in pain-free ranges/intensities  ASSESSMENT:  CLINICAL IMPRESSION: Pt continues to  make general progress in energy levels and strength. Cotninuing to try to limited overground AMB activity given persistence of foot wound and presence of surgical shoe. Pt continues to tolerate increasingly difficult balance training activity and again shows improvement within-task practice. Pt advances nustep tolerance by 2 minutes today while concurrently increasing resistance level from 3 to 4. The pt will benefit from skilled PT to address strength,endurance and balance deficits to allow improved QoL and return to PLOF.   OBJECTIVE IMPAIRMENTS: Abnormal gait, decreased activity tolerance, decreased balance, decreased endurance, decreased knowledge of condition, decreased knowledge of use of DME, decreased mobility, difficulty walking, decreased strength, impaired perceived functional ability, and impaired sensation.   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   REHAB POTENTIAL: Good  CLINICAL DECISION MAKING: Stable/uncomplicated  EVALUATION COMPLEXITY: Moderate   GOALS: Goals reviewed with patient? Yes  SHORT TERM GOALS: Target date: 06/17/2023  Patient will be independent in home exercise program to improve strength/mobility for better functional independence with ADLs. Baseline: to be give at next visit Goal status: INITIAL   LONG TERM GOALS: Target date: 07/08/2023  Patient will increase FOTO score to equal to or greater than  62   to demonstrate statistically significant improvement in mobility and quality of life.  Baseline: 53 Goal status: INITIAL  2.  Patient (> 12 years old) will complete five times sit to stand test in < 15 seconds indicating an increased LE strength and improved balance. Baseline: 24.5 Goal  status: INITIAL  3.  Patient will increase Berg Balance score by > 6 points to demonstrate decreased fall risk during functional activities Baseline: 39 Goal status: INITIAL  4.  Patient will increase 10 meter walk test to >.82m/s as to improve gait speed for better community ambulation and to reduce fall risk. Baseline: 0.51 Goal status: INITIAL  5.  Patient will reduce timed up and go to <15 seconds to reduce fall risk and demonstrate improved transfer/gait ability. Baseline: 30.3sec Goal status: INITIAL  6.  Patient will self report ability to feed chickens to indicate improve return to PLOF and reduced dependence on family for household chores Baseline: unable to perform Goal status: INITIAL   PLAN:  PT FREQUENCY: 1-2x/week  PT DURATION: 8 weeks  PLANNED INTERVENTIONS: Therapeutic exercises, Therapeutic activity, Neuromuscular re-education, Balance training, Gait training, Patient/Family education, Self Care, Joint mobilization, Stair training, Visual/preceptual remediation/compensation, Orthotic/Fit training, DME instructions, Moist heat, Compression bandaging, scar mobilization, Splintting, Taping, and Manual therapy  PLAN FOR NEXT SESSION:  Endurance, strength, balance- chronic foot wound so don't overdo time of feet    1:26 PM, 06/06/23 Rosamaria Lints, PT, DPT Physical Therapist - [facility]  Outpatient Physical Therapy- Main Campus [phone]

## 2023-06-06 NOTE — Telephone Encounter (Signed)
Author called DIL Synetta Fail to give update regarding conversation that started during PT session same date. Dr. Logan Bores with podiatry says ok for patient to transition from postop shoe to athletic shoes so long as they adequately fit feet/dressings. DIL acknowledges message.   4:12 PM, 06/06/23 Rosamaria Lints, PT, DPT Physical Therapist - [facility]  [phone] [facility])

## 2023-06-11 ENCOUNTER — Ambulatory Visit: Payer: Medicare Other

## 2023-06-11 DIAGNOSIS — R2681 Unsteadiness on feet: Secondary | ICD-10-CM

## 2023-06-11 DIAGNOSIS — R262 Difficulty in walking, not elsewhere classified: Secondary | ICD-10-CM | POA: Diagnosis not present

## 2023-06-11 DIAGNOSIS — M6281 Muscle weakness (generalized): Secondary | ICD-10-CM

## 2023-06-11 NOTE — Therapy (Signed)
OUTPATIENT PHYSICAL THERAPY TREATMENT    Patient Name: [name] MRN: [id number] DOB:07-20-1941, 82 y.o., male Today's Date: 06/12/2023  END OF SESSION:  PT End of Session - 06/11/23 1538     Visit Number 8    Number of Visits 16    Date for PT Re-Evaluation 07/08/23    Authorization Type [employer] Medicare    Authorization Time Period 04/29/23-07/22/23    Progress Note Due on Visit 10    PT Start Time 1532    PT Stop Time 1612    PT Time Calculation (min) 40 min    Equipment Utilized During Treatment Gait belt    Activity Tolerance Patient tolerated treatment well;No increased pain    Behavior During Therapy [facility] for tasks assessed/performed              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   a.) TTE 08/27/2022: Ao sinus 4.1, asc Ao 3.7, ST junction 3.3   Atherosclerotic PVD with intermittent claudication (HCC)    a.) s/p vascular intervention 03/26/2018:  post tibial and peroneal crosser athrectomy, PTA LEFT post tibial, SFA, and popliteal arteries; b.) s/p PTA 03/20/2023: RIGHT SFA and popliteal arteries   Bilateral carotid artery disease (HCC) 08/22/2022   a.) carotid doppler 08/22/2022: <50 LICA, 50-69% RICA   Blurry vision, bilateral    CAD (coronary artery disease) 09/21/2022   a.) cCTA 09/21/2022: 591 (57 %ile for age/sex/race matched control)   Chest pain    Chronic heart failure with preserved ejection fraction (HFpEF) (HCC)    a.) TTE 08/27/2022: EF >55%, aym LVH, mild LAE, triv AR/PR, mild MR/TR. mild AS, Ao root dilatation, G1DD   CKD (chronic kidney disease), stage III (HCC)    Gangrene of toe of right foot (HCC)    a.) RIGHT second toe   Heart murmur    History of bilateral cataract extraction 2020   HLD (hyperlipidemia)    Hypertension    Incomplete right bundle branch block (RBBB)    Long term current use of  antithrombotics/antiplatelets    a.) clopidogrel   Neuropathy    Palpitations    T2DM (type 2 diabetes mellitus) (HCC)    Vertigo    Wears dentures    Full upper and lower (loose)   Past Surgical History:  Procedure Laterality Date   AMPUTATION TOE Left 03/28/2018   Procedure: AMPUTATION TOE/ PARTIAL RAY RESECTION-LEFT 2ND AND 3RD;  Surgeon: Linus Galas, DPM;  Location: [facility];  Service: Podiatry;  Laterality: Left;   AMPUTATION TOE Right 04/16/2023   Procedure: AMPUTATION TOE;  Surgeon: Felecia Shelling, DPM;  Location: [facility];  Service: Orthopedics/Podiatry;  Laterality: Right;   CATARACT EXTRACTION W/PHACO Left 02/10/2019   Procedure: CATARACT EXTRACTION PHACO AND INTRAOCULAR LENS PLACEMENT (IOC)  LEFT DIABETIC;  Surgeon: Nevada Crane, MD;  Location: [facility];  Service: Ophthalmology;  Laterality: Left;  Diabetic - oral meds   CATARACT EXTRACTION W/PHACO Right 04/20/2019   Procedure: CATARACT EXTRACTION PHACO AND INTRAOCULAR LENS PLACEMENT (IOC)  RIGHT DIABETIC;  Surgeon: Nevada Crane, MD;  Location: [facility];  Service: Ophthalmology;  Laterality: Right;   FEMORAL-TIBIAL BYPASS GRAFT Right 04/11/2023   Procedure: BYPASS GRAFT FEMORAL-TIBIAL ARTERY ( POPLITEAL TO PERONEAL);  Surgeon: Renford Dills, MD;  Location: [facility];  Service: Vascular;  Laterality: Right;   HERNIA REPAIR  LOWER EXTREMITY ANGIOGRAPHY Left 03/26/2018   Procedure: Lower Extremity Angiography;  Surgeon: Renford Dills, MD;  Location: [facility];  Service: Cardiovascular;  Laterality: Left;   LOWER EXTREMITY ANGIOGRAPHY Right 03/20/2023   Procedure: Lower Extremity Angiography;  Surgeon: Renford Dills, MD;  Location: [facility];  Service: Cardiovascular;  Laterality: Right;   LOWER EXTREMITY INTERVENTION Right 05/03/2023   Procedure: LOWER EXTREMITY INTERVENTION;  Surgeon: Renford Dills, MD;  Location: [facility];  Service:  Cardiovascular;  Laterality: Right;  With Staple removal   Percutaneous transluminal angioplasty right superficial femoral artery      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   Procedure: TRANSMETATARSAL AMPUTATION;  Surgeon: Recardo Evangelist, DPM;  Location: [facility];  Service: Podiatry;  Laterality: Left;     PCP: Wilford Corner, PA-C  REFERRING PROVIDER: Georgiana Spinner, NP  REFERRING DIAG: I70.25 (ICD-10-CM) - Atherosclerosis of native arteries of the extremities with ulceration (HCC)   THERAPY DIAG:  Difficulty in walking, not elsewhere classified  Muscle weakness (generalized)  Unsteadiness on feet  Rationale for Evaluation and Treatment: Rehabilitation  ONSET DATE: 04/11/2023 for Bypass graft and Toe amputation on 04/16/2023  SUBJECTIVE:   SUBJECTIVE STATEMENT: Patient reports went shoe shopping and purchased some did not like the tennis shoes he tried on and settled on some mossy oak crocs   PERTINENT HISTORY: [name] is an 82yoM who is referred to OPPT for balance issues in the setting of recent hopsital admission, Rt PopPer bypass RLE 04/11/23 and Rt 2nd toe amputation 04/16/23. Pt was DC to home with RW (unable to use due to space) and surgical shoe (wrong shoe but podiatry says 'probably fine') WBAT. Pt is using SPC for AMB out of house, furniture for in-home AMB, has not returned to AMB over soft surfaces (feeding his chickens). Pt has not had any falls in recent months, however has remote falls. Pt formerly limited also by visual impairments 2/2 cataracts.    PAIN:  Are you having pain? Yes, a little 2/10 foot   PRECAUTIONS: Other: recent toe amputation. -Continue WBAT surgical shoe    WEIGHT BEARING RESTRICTIONS: No  FALLS:  Has patient fallen in last 6 months? No  LIVING ENVIRONMENT: Lives with: lives with their spouse Wife has health problems with back and "everything else" Lives in: Mobile home Stairs: Yes: External: 1 steps; none Has  following equipment at home: Single point cane and Walker - 2 wheeled  OCCUPATION: retured   PLOF: Independent and reports that Son has to help   PATIENT GOALS: improve safety with walking and strength in BLE so he can get out and feed the chickens.   NEXT MD VISIT: Dr Logan Bores 9/3   OBJECTIVE:    TODAY'S TREATMENT:  06/12/2023  -Seated hip march with 3# AW x 10 reps -STS from chair hugging 5kg ball x10  -Seated knee ext 3 # alt LE x 15 - Forward step up/over 1/2 foam roll x2 - down and back x 5 - Static standing on airex pad EO x 30 sec (min AP Sway)  - Static standing on airex pad EC x 10 sec x 3  (increased AP Sway with 1 posterior LOB)  - Forward step tap onto airex pad without EC 3#  - High knee march walk 3# AW- in // bars x 3 with minimal UE support.        PATIENT EDUCATION:  Education details:cues for accuracy on exercises and dialing in desired intensity   HOME EXERCISE PROGRAM:  Access Code: [email or web address] URL: [email or web address] Date: 05/16/2023 Prepared by: Temple Pacini  Exercises - Seated Long Arc Quad  - 2-3 x daily - 5-7 x weekly - 3 sets - 10 reps - Seated March  - 2-3 x daily - 5-7 x weekly - 3 sets - 10 reps - Seated Heel Raise  - 2-3 x daily - 5-7 x weekly - 3 sets - 10 reps - Sit to Stand with Counter Support  - 2-3 x daily - 5-7 x weekly - 3 sets - 10 reps *Cued to perform only in pain-free ranges/intensities  ASSESSMENT:  CLINICAL IMPRESSION: Patient presents with good motivation for today's interventions. No report of any foot pain with shoes on today. He was unsteady without use of UE support with several activities in // bars. He did demonstrate in visit progress with increased reps and responsive to all VC for correct technique. He was provided numerous short rest breaks to avoid excessive time of his feet.  The  pt will benefit from skilled PT to address strength,endurance and balance deficits to allow improved QoL and return to PLOF.   OBJECTIVE IMPAIRMENTS: Abnormal gait, decreased activity tolerance, decreased balance, decreased endurance, decreased knowledge of condition, decreased knowledge of use of DME, decreased mobility, difficulty walking, decreased strength, impaired perceived functional ability, and impaired sensation.   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   REHAB POTENTIAL: Good  CLINICAL DECISION MAKING: Stable/uncomplicated  EVALUATION COMPLEXITY: Moderate   GOALS: Goals reviewed with patient? Yes  SHORT TERM GOALS: Target date: 06/17/2023  Patient will be independent in home exercise program to improve strength/mobility for better functional independence with ADLs. Baseline: to be give at next visit Goal status: INITIAL   LONG TERM GOALS: Target date: 07/08/2023  Patient will increase FOTO score to equal to or greater than  62   to demonstrate statistically significant improvement in mobility and quality of life.  Baseline: 53 Goal status: INITIAL  2.  Patient (> 2 years old) will complete five times sit to stand test in < 15 seconds indicating an increased LE strength and improved balance. Baseline: 24.5 Goal status: INITIAL  3.  Patient will increase Berg Balance score by > 6 points to demonstrate decreased fall risk during functional activities Baseline: 39 Goal status: INITIAL  4.  Patient will increase 10 meter walk test to >.90m/s as to improve gait speed for better community ambulation and to reduce fall risk. Baseline: 0.51 Goal status: INITIAL  5.  Patient will reduce timed up and go to <15  seconds  to reduce fall risk and demonstrate improved transfer/gait ability. Baseline: 30.3sec Goal status: INITIAL  6.  Patient will self report ability to feed chickens to indicate improve return to PLOF and reduced dependence on family for household chores Baseline: unable to perform Goal status: INITIAL   PLAN:  PT FREQUENCY: 1-2x/week  PT DURATION: 8 weeks  PLANNED INTERVENTIONS: Therapeutic exercises, Therapeutic activity, Neuromuscular re-education, Balance training, Gait training, Patient/Family education, Self Care, Joint mobilization, Stair training, Visual/preceptual remediation/compensation, Orthotic/Fit training, DME instructions, Moist heat, Compression bandaging, scar mobilization, Splintting, Taping, and Manual therapy  PLAN FOR NEXT SESSION:  Endurance, strength, balance- chronic foot wound so don't overdo time of feet    2:26 PM, 06/12/23 Louis Meckel, PT Physical Therapist - [facility]  Outpatient Physical Therapy- Main Campus [phone]

## 2023-06-13 ENCOUNTER — Ambulatory Visit: Payer: Medicare Other

## 2023-06-13 DIAGNOSIS — R2681 Unsteadiness on feet: Secondary | ICD-10-CM

## 2023-06-13 DIAGNOSIS — R262 Difficulty in walking, not elsewhere classified: Secondary | ICD-10-CM

## 2023-06-13 DIAGNOSIS — M6281 Muscle weakness (generalized): Secondary | ICD-10-CM

## 2023-06-13 NOTE — Therapy (Signed)
OUTPATIENT PHYSICAL THERAPY TREATMENT    Patient Name: [name] MRN: [id number] DOB:Dec 10, 1940, 82 y.o., male Today's Date: 06/13/2023  END OF SESSION:  PT End of Session - 06/13/23 1312     Visit Number 9    Number of Visits 16    Date for PT Re-Evaluation 07/08/23    Authorization Type [employer] Medicare    Authorization Time Period 04/29/23-07/22/23    Progress Note Due on Visit 10    PT Start Time 1316    PT Stop Time 1358    PT Time Calculation (min) 42 min    Equipment Utilized During Treatment Gait belt    Activity Tolerance Patient tolerated treatment well    Behavior During Therapy WFL for tasks assessed/performed              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   a.) TTE 08/27/2022: Ao sinus 4.1, asc Ao 3.7, ST junction 3.3   Atherosclerotic PVD with intermittent claudication (HCC)    a.) s/p vascular intervention 03/26/2018:  post tibial and peroneal crosser athrectomy, PTA LEFT post tibial, SFA, and popliteal arteries; b.) s/p PTA 03/20/2023: RIGHT SFA and popliteal arteries   Bilateral carotid artery disease (HCC) 08/22/2022   a.) carotid doppler 08/22/2022: <50 LICA, 50-69% RICA   Blurry vision, bilateral    CAD (coronary artery disease) 09/21/2022   a.) cCTA 09/21/2022: 591 (57 %ile for age/sex/race matched control)   Chest pain    Chronic heart failure with preserved ejection fraction (HFpEF) (HCC)    a.) TTE 08/27/2022: EF >55%, aym LVH, mild LAE, triv AR/PR, mild MR/TR. mild AS, Ao root dilatation, G1DD   CKD (chronic kidney disease), stage III (HCC)    Gangrene of toe of right foot (HCC)    a.) RIGHT second toe   Heart murmur    History of bilateral cataract extraction 2020   HLD (hyperlipidemia)    Hypertension    Incomplete right bundle branch block (RBBB)    Long term current use of antithrombotics/antiplatelets     a.) clopidogrel   Neuropathy    Palpitations    T2DM (type 2 diabetes mellitus) (HCC)    Vertigo    Wears dentures    Full upper and lower (loose)   Past Surgical History:  Procedure Laterality Date   AMPUTATION TOE Left 03/28/2018   Procedure: AMPUTATION TOE/ PARTIAL RAY RESECTION-LEFT 2ND AND 3RD;  Surgeon: Linus Galas, DPM;  Location: [facility];  Service: Podiatry;  Laterality: Left;   AMPUTATION TOE Right 04/16/2023   Procedure: AMPUTATION TOE;  Surgeon: Felecia Shelling, DPM;  Location: [facility];  Service: Orthopedics/Podiatry;  Laterality: Right;   CATARACT EXTRACTION W/PHACO Left 02/10/2019   Procedure: CATARACT EXTRACTION PHACO AND INTRAOCULAR LENS PLACEMENT (IOC)  LEFT DIABETIC;  Surgeon: Nevada Crane, MD;  Location: [facility];  Service: Ophthalmology;  Laterality: Left;  Diabetic - oral meds   CATARACT EXTRACTION W/PHACO Right 04/20/2019   Procedure: CATARACT EXTRACTION PHACO AND INTRAOCULAR LENS PLACEMENT (IOC)  RIGHT DIABETIC;  Surgeon: Nevada Crane, MD;  Location: [facility];  Service: Ophthalmology;  Laterality: Right;   FEMORAL-TIBIAL BYPASS GRAFT Right 04/11/2023   Procedure: BYPASS GRAFT FEMORAL-TIBIAL ARTERY ( POPLITEAL TO PERONEAL);  Surgeon: Renford Dills, MD;  Location: [facility];  Service: Vascular;  Laterality: Right;   HERNIA REPAIR  LOWER EXTREMITY ANGIOGRAPHY Left 03/26/2018   Procedure: Lower Extremity Angiography;  Surgeon: Renford Dills, MD;  Location: [facility];  Service: Cardiovascular;  Laterality: Left;   LOWER EXTREMITY ANGIOGRAPHY Right 03/20/2023   Procedure: Lower Extremity Angiography;  Surgeon: Renford Dills, MD;  Location: [facility];  Service: Cardiovascular;  Laterality: Right;   LOWER EXTREMITY INTERVENTION Right 05/03/2023   Procedure: LOWER EXTREMITY INTERVENTION;  Surgeon: Renford Dills, MD;  Location: [facility];  Service: Cardiovascular;  Laterality: Right;  With  Staple removal   Percutaneous transluminal angioplasty right superficial femoral artery      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   Procedure: TRANSMETATARSAL AMPUTATION;  Surgeon: Recardo Evangelist, DPM;  Location: [facility];  Service: Podiatry;  Laterality: Left;     PCP: Wilford Corner, PA-C  REFERRING PROVIDER: Georgiana Spinner, NP  REFERRING DIAG: I70.25 (ICD-10-CM) - Atherosclerosis of native arteries of the extremities with ulceration (HCC)   THERAPY DIAG:  Muscle weakness (generalized)  Difficulty in walking, not elsewhere classified  Unsteadiness on feet  Rationale for Evaluation and Treatment: Rehabilitation  ONSET DATE: 04/11/2023 for Bypass graft and Toe amputation on 04/16/2023  SUBJECTIVE:   SUBJECTIVE STATEMENT: Pt says his foot is finally beginning "to heal up." Pt feels like he has a ball on the inside of his foot. He reports it has been this way for a long time. Pt reports no falls.  He still stumbles some. He has ambulated out shopping at [place] and states he can ambulate "all day" with buggy.  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 Pt was DC to home with RW (unable to use due to space) and surgical shoe (wrong shoe but podiatry says 'probably fine') WBAT. Pt is using SPC for AMB out of house, furniture for in-home AMB, has not returned to AMB over soft surfaces (feeding his chickens). Pt has not had any falls in recent months, however has remote falls. Pt formerly limited also by visual impairments 2/2 cataracts.    PAIN:  Are you having pain? Yes, a little 2/10 foot   PRECAUTIONS: Other: recent toe amputation. -Continue WBAT surgical shoe    WEIGHT BEARING RESTRICTIONS: No  FALLS:  Has patient fallen in last 6 months? No  LIVING ENVIRONMENT: Lives with: lives with their spouse Wife has  health problems with back and "everything else" Lives in: Mobile home Stairs: Yes: External: 1 steps; none Has following equipment at home: Single point cane and Walker - 2 wheeled  OCCUPATION: retured   PLOF: Independent and reports that Son has to help   PATIENT GOALS: improve safety with walking and strength in BLE so he can get out and feed the chickens.   NEXT MD VISIT: Dr Logan Bores 9/3   OBJECTIVE:    TODAY'S TREATMENT:  06/13/2023  Gait belt donned throughout to ensure pt safety   TE: -Seated hip march with 3# AW  2x 10 reps, Reports RLE fatigue   -STS 10x -Ambulate x 60 ft with SPC and 3# AW, CGA to promote strength/endurance -STS 10x  -Seated knee ext 3 # alt LE x 15 -STS holding 3k gr ball 10x. Rates easy -Ambulate x 148 ft with SPC for strength/endurance, CGA - STS 10x   NMR: -Ambulate x 148 ft without AD with close CGA for dynamic balance activity  Amb in // bars with intermittent UE support FWD and with turning x multiple reps, close CGA Step-over half foam FWD/BCKWD and LTL x multiple reps of each. LTL significantly more challenging.     PATIENT EDUCATION:  Education details: Pt educated throughout session about proper posture and technique with exercises. Improved exercise technique, movement at target joints, use of target muscles after min to mod verbal, visual, tactile cues.    HOME EXERCISE PROGRAM:  Access Code: [email or web address] URL: [email or web address] Date: 05/16/2023 Prepared by: Temple Pacini  Exercises - Seated Long Arc Quad  - 2-3 x daily - 5-7 x weekly - 3 sets - 10 reps - Seated March  - 2-3 x daily - 5-7 x weekly - 3 sets - 10 reps - Seated Heel Raise  - 2-3 x daily - 5-7 x weekly - 3 sets - 10 reps - Sit to Stand with Counter Support  - 2-3 x daily - 5-7 x weekly - 3 sets - 10 reps *Cued to perform only in  pain-free ranges/intensities  ASSESSMENT:  CLINICAL IMPRESSION: Pt tolerated interventions well without significant fatigue and with minimal discomfort in foot, reported only 1x that improved with further activity. Pt able to progress volume of STS and to more challenging dynamic balance tasks in // bars. He struggles the most with LTL stepping with obstacle clearance.  The pt will benefit from skilled PT to address strength,endurance and balance deficits to allow improved QoL and return to PLOF.   OBJECTIVE IMPAIRMENTS: Abnormal gait, decreased activity tolerance, decreased balance, decreased endurance, decreased knowledge of condition, decreased knowledge of use of DME, decreased mobility, difficulty walking, decreased strength, impaired perceived functional ability, and impaired sensation.   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   REHAB POTENTIAL: Good  CLINICAL DECISION MAKING: Stable/uncomplicated  EVALUATION COMPLEXITY: Moderate   GOALS: Goals reviewed with patient? Yes  SHORT TERM GOALS: Target date: 06/17/2023  Patient will be independent in home exercise program to improve strength/mobility for better functional independence with ADLs. Baseline: to be give at next visit Goal status: INITIAL   LONG TERM GOALS: Target date: 07/08/2023  Patient will increase FOTO score to equal to or greater than  62   to demonstrate statistically significant improvement in mobility and quality of life.  Baseline: 53 Goal status: INITIAL  2.  Patient (> 38 years old) will complete five times sit to stand test in < 15 seconds indicating an increased LE strength and improved balance. Baseline: 24.5 Goal status: INITIAL  3.   Patient will increase Berg Balance score by > 6 points to demonstrate decreased fall risk during functional activities Baseline: 39 Goal status: INITIAL  4.  Patient will increase 10 meter walk test to >.15m/s as to improve gait speed for better community  ambulation and to reduce fall risk. Baseline: 0.51 Goal status: INITIAL  5.  Patient will reduce timed up and go to <15 seconds to reduce fall risk and demonstrate improved transfer/gait ability. Baseline: 30.3sec Goal status: INITIAL  6.  Patient will self report ability to feed chickens to indicate improve return to PLOF and reduced dependence on family for household chores Baseline: unable to perform Goal status: INITIAL   PLAN:  PT FREQUENCY: 1-2x/week  PT DURATION: 8 weeks  PLANNED INTERVENTIONS: Therapeutic exercises, Therapeutic activity, Neuromuscular re-education, Balance training, Gait training, Patient/Family education, Self Care, Joint mobilization, Stair training, Visual/preceptual remediation/compensation, Orthotic/Fit training, DME instructions, Moist heat, Compression bandaging, scar mobilization, Splintting, Taping, and Manual therapy  PLAN FOR NEXT SESSION:  Endurance, strength, balance- chronic foot wound so don't overdo time of feet    3:14 PM, 06/13/23 Temple Pacini PT, DPT  Physical Therapist - [facility]  Outpatient Physical Therapy- Main Campus [phone]

## 2023-06-16 ENCOUNTER — Encounter: Payer: Self-pay | Admitting: Podiatry

## 2023-06-16 ENCOUNTER — Encounter (INDEPENDENT_AMBULATORY_CARE_PROVIDER_SITE_OTHER): Payer: Self-pay

## 2023-06-17 NOTE — Telephone Encounter (Signed)
Can we get him in tomorrow, with an arterial duplex to see if his graft is patent

## 2023-06-18 ENCOUNTER — Ambulatory Visit (INDEPENDENT_AMBULATORY_CARE_PROVIDER_SITE_OTHER): Payer: Medicare Other

## 2023-06-18 ENCOUNTER — Ambulatory Visit: Payer: Medicare Other | Attending: Nurse Practitioner

## 2023-06-18 ENCOUNTER — Other Ambulatory Visit (INDEPENDENT_AMBULATORY_CARE_PROVIDER_SITE_OTHER): Payer: Self-pay | Admitting: Nurse Practitioner

## 2023-06-18 DIAGNOSIS — S91301S Unspecified open wound, right foot, sequela: Secondary | ICD-10-CM | POA: Diagnosis not present

## 2023-06-18 DIAGNOSIS — Z9889 Other specified postprocedural states: Secondary | ICD-10-CM

## 2023-06-18 DIAGNOSIS — R2681 Unsteadiness on feet: Secondary | ICD-10-CM | POA: Diagnosis present

## 2023-06-18 DIAGNOSIS — M6281 Muscle weakness (generalized): Secondary | ICD-10-CM | POA: Diagnosis present

## 2023-06-18 DIAGNOSIS — R262 Difficulty in walking, not elsewhere classified: Secondary | ICD-10-CM | POA: Insufficient documentation

## 2023-06-18 DIAGNOSIS — M79671 Pain in right foot: Secondary | ICD-10-CM | POA: Diagnosis present

## 2023-06-18 DIAGNOSIS — I739 Peripheral vascular disease, unspecified: Secondary | ICD-10-CM | POA: Diagnosis not present

## 2023-06-18 NOTE — Therapy (Signed)
OUTPATIENT PHYSICAL THERAPY TREATMENT /Physical Therapy Progress Note   Dates of reporting period  05/13/2023   to   06/18/2023    Patient Name: [name] MRN: [id number] DOB:01/20/1941, 82 y.o., male Today's Date: 06/18/2023  END OF SESSION:  PT End of Session - 06/18/23 0931     Visit Number 10    Number of Visits 16    Date for PT Re-Evaluation 07/08/23    Authorization Type [employer] Medicare    Authorization Time Period 04/29/23-07/22/23    Progress Note Due on Visit 10    PT Start Time 0931    PT Stop Time 1010    PT Time Calculation (min) 39 min    Equipment Utilized During Treatment Gait belt    Activity Tolerance Patient tolerated treatment well    Behavior During Therapy WFL for tasks assessed/performed              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   a.) TTE 08/27/2022: Ao sinus 4.1, asc Ao 3.7, ST junction 3.3   Atherosclerotic PVD with intermittent claudication (HCC)    a.) s/p vascular intervention 03/26/2018:  post tibial and peroneal crosser athrectomy, PTA LEFT post tibial, SFA, and popliteal arteries; b.) s/p PTA 03/20/2023: RIGHT SFA and popliteal arteries   Bilateral carotid artery disease (HCC) 08/22/2022   a.) carotid doppler 08/22/2022: <50 LICA, 50-69% RICA   Blurry vision, bilateral    CAD (coronary artery disease) 09/21/2022   a.) cCTA 09/21/2022: 591 (57 %ile for age/sex/race matched control)   Chest pain    Chronic heart failure with preserved ejection fraction (HFpEF) (HCC)    a.) TTE 08/27/2022: EF >55%, aym LVH, mild LAE, triv AR/PR, mild MR/TR. mild AS, Ao root dilatation, G1DD   CKD (chronic kidney disease), stage III (HCC)    Gangrene of toe of right foot (HCC)    a.) RIGHT second toe   Heart murmur    History of bilateral cataract extraction 2020   HLD (hyperlipidemia)    Hypertension    Incomplete  right bundle branch block (RBBB)    Long term current use of antithrombotics/antiplatelets    a.) clopidogrel   Neuropathy    Palpitations    T2DM (type 2 diabetes mellitus) (HCC)    Vertigo    Wears dentures    Full upper and lower (loose)   Past Surgical History:  Procedure Laterality Date   AMPUTATION TOE Left 03/28/2018   Procedure: AMPUTATION TOE/ PARTIAL RAY RESECTION-LEFT 2ND AND 3RD;  Surgeon: Linus Galas, DPM;  Location: [facility];  Service: Podiatry;  Laterality: Left;   AMPUTATION TOE Right 04/16/2023   Procedure: AMPUTATION TOE;  Surgeon: Felecia Shelling, DPM;  Location: [facility];  Service: Orthopedics/Podiatry;  Laterality: Right;   CATARACT EXTRACTION W/PHACO Left 02/10/2019   Procedure: CATARACT EXTRACTION PHACO AND INTRAOCULAR LENS PLACEMENT (IOC)  LEFT DIABETIC;  Surgeon: Nevada Crane, MD;  Location: [facility];  Service: Ophthalmology;  Laterality: Left;  Diabetic - oral meds   CATARACT EXTRACTION W/PHACO Right 04/20/2019   Procedure: CATARACT EXTRACTION PHACO AND INTRAOCULAR LENS PLACEMENT (IOC)  RIGHT DIABETIC;  Surgeon: Nevada Crane, MD;  Location: [facility];  Service: Ophthalmology;  Laterality: Right;   FEMORAL-TIBIAL BYPASS GRAFT Right 04/11/2023   Procedure: BYPASS GRAFT FEMORAL-TIBIAL ARTERY ( POPLITEAL TO PERONEAL);  Surgeon: Renford Dills,  MD;  Location: [facility];  Service: Vascular;  Laterality: Right;   HERNIA REPAIR     LOWER EXTREMITY ANGIOGRAPHY Left 03/26/2018   Procedure: Lower Extremity Angiography;  Surgeon: Renford Dills, MD;  Location: [facility];  Service: Cardiovascular;  Laterality: Left;   LOWER EXTREMITY ANGIOGRAPHY Right 03/20/2023   Procedure: Lower Extremity Angiography;  Surgeon: Renford Dills, MD;  Location: [facility];  Service: Cardiovascular;  Laterality: Right;   LOWER EXTREMITY INTERVENTION Right 05/03/2023   Procedure: LOWER EXTREMITY INTERVENTION;  Surgeon: Renford Dills, MD;  Location: [facility];  Service: Cardiovascular;  Laterality: Right;  With Staple removal   Percutaneous transluminal angioplasty right superficial femoral artery      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   Procedure: TRANSMETATARSAL AMPUTATION;  Surgeon: Recardo Evangelist, DPM;  Location: [facility];  Service: Podiatry;  Laterality: Left;     PCP: Wilford Corner, PA-C  REFERRING PROVIDER: Georgiana Spinner, NP  REFERRING DIAG: I70.25 (ICD-10-CM) - Atherosclerosis of native arteries of the extremities with ulceration (HCC)   THERAPY DIAG:  Muscle weakness (generalized)  Difficulty in walking, not elsewhere classified  Unsteadiness on feet  Pain in right foot  Rationale for Evaluation and Treatment: Rehabilitation  ONSET DATE: 04/11/2023 for Bypass graft and Toe amputation on 04/16/2023  SUBJECTIVE:   SUBJECTIVE STATEMENT: Pt not feeling great today. Pt wore original boot and reports he walked a lot on Sunday and reports lateral foot wound was healing well but has worsened since Sunday. Pt now wearing other shoes and has appointment today with vascular.  Pt reports some pain in R foot. DIL thinks his boot likely rubbed against lateral foot.  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 Pt was DC to home with RW (unable to use due to space) and surgical shoe (wrong shoe but podiatry says 'probably fine') WBAT. Pt is using SPC for AMB out of house, furniture for in-home AMB, has not returned to AMB over soft surfaces (feeding his chickens). Pt has not had any falls in recent months, however has remote falls. Pt formerly limited also by visual impairments 2/2 cataracts.    PAIN:  Are you having pain? Yes, a little 2/10 foot   PRECAUTIONS: Other: recent toe amputation. -Continue WBAT surgical shoe    WEIGHT  BEARING RESTRICTIONS: No  FALLS:  Has patient fallen in last 6 months? No  LIVING ENVIRONMENT: Lives with: lives with their spouse Wife has health problems with back and "everything else" Lives in: Mobile home Stairs: Yes: External: 1 steps; none Has following equipment at home: Single point cane and Walker - 2 wheeled  OCCUPATION: retured   PLOF: Independent and reports that Son has to help   PATIENT GOALS: improve safety with walking and strength in BLE so he can get out and feed the chickens.   NEXT MD VISIT: Dr Logan Bores 9/3   OBJECTIVE:    TODAY'S TREATMENT:  06/18/2023  Gait belt donned throughout to ensure pt safety   TA: goal reassessment completed for progress note. Please refer to goal section and assessment for details. PT reviewed results and indications on test performance with pt and caregiver.  PT reviewed importance of performing (seated) HEP to maintain and continue gains.  PT and pt reviewed importance of following wound care recommendations, including for foot wear.    TE: LAQ 2x15 each LE  Seated march 2x10, 1x5 each LE. Easier on R than LLE  Seated DF 2x20 bilat  Seated hip adduction with pball 10x, 20x bilat LE  Comments: no pain with interventions     PATIENT EDUCATION:  Education details: Goal reassessment, findings, plan, exercise technique     HOME EXERCISE PROGRAM:  10/1: PT instructed pt to only perform seated interventions for now until reevaluated by vascular. Discontinue performing any interventions for now that increase foot pain  Access Code: [email or web address] URL: [email or web address] Date: 05/16/2023 Prepared by: Temple Pacini  Exercises - Seated Long Arc Quad  - 2-3 x daily - 5-7 x weekly - 3 sets - 10 reps - Seated March  - 2-3 x daily - 5-7 x weekly - 3 sets - 10 reps - Seated Heel Raise  - 2-3 x  daily - 5-7 x weekly - 3 sets - 10 reps - Sit to Stand with Counter Support  - 2-3 x daily - 5-7 x weekly - 3 sets - 10 reps *Cued to perform only in pain-free ranges/intensities  ASSESSMENT:  CLINICAL IMPRESSION: Interventions and testing limited due to increase in R foot pain (see above). Pt performed mostly seated exercise or very brief ambulation to limit weightbearing on R foot. Goal reassessment was completed as able, with some tests deferred to future visit until pt can be seen again by vascular / wound care. Pt making gains on 5xSTS, , and ADL goals. While pt making gains, FOTO score did decrease, however, this likely more reflective of pt's function compared to initial assessment. Patient's condition has the potential to improve in response to therapy. Maximum improvement is yet to be obtained. The anticipated improvement is attainable and reasonable in a generally predictable time.  The pt will benefit from skilled PT to address strength,endurance and balance deficits to allow improved QoL and return to PLOF.   OBJECTIVE IMPAIRMENTS: Abnormal gait, decreased activity tolerance, decreased balance, decreased endurance, decreased knowledge of condition, decreased knowledge of use of DME, decreased mobility, difficulty walking, decreased strength, impaired perceived functional ability, and impaired sensation.   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   REHAB POTENTIAL: Good  CLINICAL DECISION MAKING: Stable/uncomplicated  EVALUATION COMPLEXITY: Moderate   GOALS: Goals reviewed with patient? Yes  SHORT TERM GOALS: Target date: 06/17/2023  Patient will be independent in home exercise program to improve strength/mobility  for better functional independence with ADLs. Baseline: to be give at next visit; 10/1: pt reports not consistent with HEP Goal status: IN PROGRESS   LONG TERM GOALS: Target date: 07/08/2023  Patient will increase FOTO score to equal to or greater than  62   to demonstrate statistically significant improvement in mobility and quality of life.  Baseline: 53; 10/1:  51 Goal status: IN PROGRESS  2.  Patient (> 49 years old) will complete five times sit to stand test  in < 15 seconds indicating an increased LE strength and improved balance. Baseline: 24.5 10/1: 16 sec  Goal status:PARTIALLY MET  3.  Patient will increase Berg Balance score by > 6 points to demonstrate decreased fall risk during functional activities Baseline: 39; 10/1: deferred Goal status: IN PROGRESS  4.  Patient will increase 10 meter walk test to >.2m/s as to improve gait speed for better community ambulation and to reduce fall risk. Baseline: 0.51; 10/1:  0.63 m/s with SPC  Goal status: IN PROGRESS  5.  Patient will reduce timed up and go to <15 seconds to reduce fall risk and demonstrate improved transfer/gait ability. Baseline: 30.3sec; 10/1: deferred Goal status:IN PROGRESS  6.  Patient will self report ability to feed chickens to indicate improve return to PLOF and reduced dependence on family for household chores Baseline: unable to perform; 10/1: pt able to do indep if weather is good, but otherwise gets help  Goal status: IN PROGRESS   PLAN:  PT FREQUENCY: 1-2x/week  PT DURATION: 8 weeks  PLANNED INTERVENTIONS: Therapeutic exercises, Therapeutic activity, Neuromuscular re-education, Balance training, Gait training, Patient/Family education, Self Care, Joint mobilization, Stair training, Visual/preceptual remediation/compensation, Orthotic/Fit training, DME instructions, Moist heat, Compression bandaging, scar mobilization, Splintting, Taping, and Manual therapy  PLAN FOR NEXT SESSION:  Endurance,  strength, balance- chronic foot wound so don't overdo time of feet    10:22 AM, 06/18/23 Temple Pacini PT, DPT  Physical Therapist - [facility]  Outpatient Physical Therapy- Main Campus [phone]

## 2023-06-20 ENCOUNTER — Ambulatory Visit: Payer: Medicare Other

## 2023-06-20 ENCOUNTER — Encounter: Payer: Medicare Other | Attending: Physician Assistant | Admitting: Physician Assistant

## 2023-06-20 DIAGNOSIS — N1831 Chronic kidney disease, stage 3a: Secondary | ICD-10-CM | POA: Insufficient documentation

## 2023-06-20 DIAGNOSIS — I2510 Atherosclerotic heart disease of native coronary artery without angina pectoris: Secondary | ICD-10-CM | POA: Insufficient documentation

## 2023-06-20 DIAGNOSIS — I130 Hypertensive heart and chronic kidney disease with heart failure and stage 1 through stage 4 chronic kidney disease, or unspecified chronic kidney disease: Secondary | ICD-10-CM | POA: Insufficient documentation

## 2023-06-20 DIAGNOSIS — E11621 Type 2 diabetes mellitus with foot ulcer: Secondary | ICD-10-CM | POA: Insufficient documentation

## 2023-06-20 DIAGNOSIS — L97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 ulcer of other part of right foot with fat layer exposed: Secondary | ICD-10-CM | POA: Diagnosis not present

## 2023-06-20 DIAGNOSIS — E1140 Type 2 diabetes mellitus with diabetic neuropathy, unspecified: Secondary | ICD-10-CM | POA: Diagnosis not present

## 2023-06-20 DIAGNOSIS — E1151 Type 2 diabetes mellitus with diabetic peripheral angiopathy without gangrene: Secondary | ICD-10-CM | POA: Diagnosis not present

## 2023-06-20 DIAGNOSIS — Z7901 Long term (current) use of anticoagulants: Secondary | ICD-10-CM | POA: Diagnosis not present

## 2023-06-20 DIAGNOSIS — E1122 Type 2 diabetes mellitus with diabetic chronic kidney disease: Secondary | ICD-10-CM | POA: Insufficient documentation

## 2023-06-20 DIAGNOSIS — R2681 Unsteadiness on feet: Secondary | ICD-10-CM

## 2023-06-20 DIAGNOSIS — M6281 Muscle weakness (generalized): Secondary | ICD-10-CM | POA: Diagnosis not present

## 2023-06-20 DIAGNOSIS — M79671 Pain in right foot: Secondary | ICD-10-CM

## 2023-06-20 DIAGNOSIS — I509 Heart failure, unspecified: Secondary | ICD-10-CM | POA: Insufficient documentation

## 2023-06-20 DIAGNOSIS — R262 Difficulty in walking, not elsewhere classified: Secondary | ICD-10-CM

## 2023-06-20 NOTE — Therapy (Signed)
OUTPATIENT PHYSICAL THERAPY TREATMENT /Physical Therapy Progress Note   Dates of reporting period  05/13/2023   to   06/18/2023    Patient Name: [name] MRN: [id number] DOB:Jun 02, 1941, 82 y.o., male Today's Date: 06/20/2023  END OF SESSION:  PT End of Session - 06/20/23 1155     Visit Number 11    Number of Visits 16    Date for PT Re-Evaluation 07/08/23    Authorization Type [employer] Medicare    Authorization Time Period 04/29/23-07/22/23    Progress Note Due on Visit 10    PT Start Time 1146    Equipment Utilized During Treatment Gait belt    Activity Tolerance Patient tolerated treatment well    Behavior During Therapy [facility] for tasks assessed/performed              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   a.) TTE 08/27/2022: Ao sinus 4.1, asc Ao 3.7, ST junction 3.3   Atherosclerotic PVD with intermittent claudication (HCC)    a.) s/p vascular intervention 03/26/2018:  post tibial and peroneal crosser athrectomy, PTA LEFT post tibial, SFA, and popliteal arteries; b.) s/p PTA 03/20/2023: RIGHT SFA and popliteal arteries   Bilateral carotid artery disease (HCC) 08/22/2022   a.) carotid doppler 08/22/2022: <50 LICA, 50-69% RICA   Blurry vision, bilateral    CAD (coronary artery disease) 09/21/2022   a.) cCTA 09/21/2022: 591 (57 %ile for age/sex/race matched control)   Chest pain    Chronic heart failure with preserved ejection fraction (HFpEF) (HCC)    a.) TTE 08/27/2022: EF >55%, aym LVH, mild LAE, triv AR/PR, mild MR/TR. mild AS, Ao root dilatation, G1DD   CKD (chronic kidney disease), stage III (HCC)    Gangrene of toe of right foot (HCC)    a.) RIGHT second toe   Heart murmur    History of bilateral cataract extraction 2020   HLD (hyperlipidemia)    Hypertension    Incomplete right bundle branch block (RBBB)    Long term current use of  antithrombotics/antiplatelets    a.) clopidogrel   Neuropathy    Palpitations    T2DM (type 2 diabetes mellitus) (HCC)    Vertigo    Wears dentures    Full upper and lower (loose)   Past Surgical History:  Procedure Laterality Date   AMPUTATION TOE Left 03/28/2018   Procedure: AMPUTATION TOE/ PARTIAL RAY RESECTION-LEFT 2ND AND 3RD;  Surgeon: Linus Galas, DPM;  Location: [facility];  Service: Podiatry;  Laterality: Left;   AMPUTATION TOE Right 04/16/2023   Procedure: AMPUTATION TOE;  Surgeon: Felecia Shelling, DPM;  Location: [facility];  Service: Orthopedics/Podiatry;  Laterality: Right;   CATARACT EXTRACTION W/PHACO Left 02/10/2019   Procedure: CATARACT EXTRACTION PHACO AND INTRAOCULAR LENS PLACEMENT (IOC)  LEFT DIABETIC;  Surgeon: Nevada Crane, MD;  Location: [facility];  Service: Ophthalmology;  Laterality: Left;  Diabetic - oral meds   CATARACT EXTRACTION W/PHACO Right 04/20/2019   Procedure: CATARACT EXTRACTION PHACO AND INTRAOCULAR LENS PLACEMENT (IOC)  RIGHT DIABETIC;  Surgeon: Nevada Crane, MD;  Location: [facility];  Service: Ophthalmology;  Laterality: Right;   FEMORAL-TIBIAL BYPASS GRAFT Right 04/11/2023   Procedure: BYPASS GRAFT FEMORAL-TIBIAL ARTERY ( POPLITEAL TO PERONEAL);  Surgeon: Renford Dills, MD;  Location: [facility];  Service: Vascular;  Laterality: Right;   HERNIA REPAIR  LOWER EXTREMITY ANGIOGRAPHY Left 03/26/2018   Procedure: Lower Extremity Angiography;  Surgeon: Renford Dills, MD;  Location: [facility];  Service: Cardiovascular;  Laterality: Left;   LOWER EXTREMITY ANGIOGRAPHY Right 03/20/2023   Procedure: Lower Extremity Angiography;  Surgeon: Renford Dills, MD;  Location: [facility];  Service: Cardiovascular;  Laterality: Right;   LOWER EXTREMITY INTERVENTION Right 05/03/2023   Procedure: LOWER EXTREMITY INTERVENTION;  Surgeon: Renford Dills, MD;  Location: [facility];  Service:  Cardiovascular;  Laterality: Right;  With Staple removal   Percutaneous transluminal angioplasty right superficial femoral artery      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   Procedure: TRANSMETATARSAL AMPUTATION;  Surgeon: Recardo Evangelist, DPM;  Location: [facility];  Service: Podiatry;  Laterality: Left;     PCP: Wilford Corner, PA-C  REFERRING PROVIDER: Georgiana Spinner, NP  REFERRING DIAG: I70.25 (ICD-10-CM) - Atherosclerosis of native arteries of the extremities with ulceration (HCC)   THERAPY DIAG:  Muscle weakness (generalized)  Difficulty in walking, not elsewhere classified  Unsteadiness on feet  Pain in right foot  Rationale for Evaluation and Treatment: Rehabilitation  ONSET DATE: 04/11/2023 for Bypass graft and Toe amputation on 04/16/2023  SUBJECTIVE:   SUBJECTIVE STATEMENT: Pt not feeling great today. Pt wore original boot and reports he walked a lot on Sunday and reports lateral foot wound was healing well but has worsened since Sunday. Pt now wearing other shoes and has appointment today with vascular.  Pt reports some pain in R foot. DIL thinks his boot likely rubbed against lateral foot.  PERTINENT HISTORY: [name] is an 82yoM who is referred to OPPT for balance issues in the setting of recent hopsital admission, Rt PopPer bypass RLE 04/11/23 and Rt 2nd toe amputation 04/16/23. Pt was DC to home with RW (unable to use due to space) and surgical shoe (wrong shoe but podiatry says 'probably fine') WBAT. Pt is using SPC for AMB out of house, furniture for in-home AMB, has not returned to AMB over soft surfaces (feeding his chickens). Pt has not had any falls in recent months, however has remote falls. Pt formerly limited also by visual impairments 2/2 cataracts.    PAIN:  Are you having pain? Yes, a little 2/10 foot   PRECAUTIONS: Other: recent toe amputation. -Continue WBAT surgical shoe    WEIGHT BEARING RESTRICTIONS: No  FALLS:  Has patient  fallen in last 6 months? No  LIVING ENVIRONMENT: Lives with: lives with their spouse Wife has health problems with back and "everything else" Lives in: Mobile home Stairs: Yes: External: 1 steps; none Has following equipment at home: Single point cane and Walker - 2 wheeled  OCCUPATION: retured   PLOF: Independent and reports that Son has to help   PATIENT GOALS: improve safety with walking and strength in BLE so he can get out and feed the chickens.   NEXT MD VISIT: Dr Logan Bores 9/3   OBJECTIVE:    TODAY'S TREATMENT:  06/20/2023  Gait belt donned throughout to ensure pt safety     TE:  Seated march with 3# AW 2x10, 1x5 each LE. Easier on R than LLE  LAQ 3# AW 2 sets of 10 reps each LE  Seated hip adduction with pball 10x, 20x bilat LE  Seated leg lift up/over 1/2 foam foll - 2 sets  x 10 reps with 3# Seated ham curls with RTB - 2 sets x 10 reps    PATIENT EDUCATION:  Education details: Goal reassessment, findings, plan, exercise technique     HOME EXERCISE PROGRAM:  10/1: PT instructed pt to only perform seated interventions for now until reevaluated by vascular. Discontinue performing any interventions for now that increase foot pain  Access Code: [email or web address] URL: [email or web address] Date: 05/16/2023 Prepared by: Temple Pacini  Exercises - Seated Long Arc Quad  - 2-3 x daily - 5-7 x weekly - 3 sets - 10 reps - Seated March  - 2-3 x daily - 5-7 x weekly - 3 sets - 10 reps - Seated Heel Raise  - 2-3 x daily - 5-7 x weekly - 3 sets - 10 reps - Sit to Stand with Counter Support  - 2-3 x daily - 5-7 x weekly - 3 sets - 10 reps *Cued to perform only in pain-free ranges/intensities  ASSESSMENT:  CLINICAL IMPRESSION: Treatment still very limited to non-weight bearing strengthening today. Patient has appointment at wound care for  evaluation later today and will look for any updates on weight bearing. He performed well with some resistance toady - citing only fatigue as limiting factor.  The pt will benefit from skilled PT to address strength,endurance and balance deficits to allow improved QoL and return to PLOF.   OBJECTIVE IMPAIRMENTS: Abnormal gait, decreased activity tolerance, decreased balance, decreased endurance, decreased knowledge of condition, decreased knowledge of use of DME, decreased mobility, difficulty walking, decreased strength, impaired perceived functional ability, and impaired sensation.   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   REHAB POTENTIAL: Good  CLINICAL DECISION MAKING: Stable/uncomplicated  EVALUATION COMPLEXITY: Moderate   GOALS: Goals reviewed with patient? Yes  SHORT TERM GOALS: Target date: 06/17/2023  Patient will be independent in home exercise program to improve strength/mobility for better functional independence with ADLs. Baseline: to be give at next visit; 10/1: pt reports not consistent with HEP Goal status: IN PROGRESS   LONG TERM GOALS: Target date: 07/08/2023  Patient will increase FOTO score to equal to or greater than  62   to demonstrate statistically significant improvement in mobility and quality of life.  Baseline: 53; 10/1:  51 Goal status: IN PROGRESS  2.  Patient (> 30 years old) will complete five times sit to stand test in < 15 seconds indicating an increased LE strength and improved balance. Baseline: 24.5 10/1: 16 sec  Goal status:PARTIALLY MET  3.  Patient will increase Berg Balance score by > 6 points to demonstrate decreased fall risk during functional activities Baseline: 39; 10/1:  deferred Goal status: IN PROGRESS  4.  Patient will increase 10 meter walk test to >.64m/s as to improve gait speed for better community ambulation and to reduce fall risk. Baseline: 0.51; 10/1:  0.63 m/s with SPC  Goal status: IN PROGRESS  5.  Patient will reduce timed up and go to <  15 seconds to reduce fall risk and demonstrate improved transfer/gait ability. Baseline: 30.3sec; 10/1: deferred Goal status:IN PROGRESS  6.  Patient will self report ability to feed chickens to indicate improve return to PLOF and reduced dependence on family for household chores Baseline: unable to perform; 10/1: pt able to do indep if weather is good, but otherwise gets help  Goal status: IN PROGRESS   PLAN:  PT FREQUENCY: 1-2x/week  PT DURATION: 8 weeks  PLANNED INTERVENTIONS: Therapeutic exercises, Therapeutic activity, Neuromuscular re-education, Balance training, Gait training, Patient/Family education, Self Care, Joint mobilization, Stair training, Visual/preceptual remediation/compensation, Orthotic/Fit training, DME instructions, Moist heat, Compression bandaging, scar mobilization, Splintting, Taping, and Manual therapy  PLAN FOR NEXT SESSION:  Endurance, strength, balance- chronic foot wound so don't overdo time of feet    11:55 AM, 06/20/23 Louis Meckel, PT Physical Therapist - [facility]  Outpatient Physical Therapy- Main Campus [phone]

## 2023-06-21 NOTE — Progress Notes (Signed)
0.25 Discharge Instruction: Use as directed. Peri-Wound Care Topical Primary Dressing Gauze Discharge Instruction: As directed: dry, moistened with saline or moistened with Dakins Solution Secondary Dressing Conforming Guaze Roll-Large Discharge Instruction: Apply Conforming Stretch Guaze Bandage as directed Secured With Medipore T - 6M Medipore H Soft Cloth Surgical T ape ape, 2x2 (in/yd) Compression Wrap Compression Stockings Add-Ons Wound #2 (Foot) Wound Laterality: Plantar, Right Cleanser Soap and Water Discharge Instruction: Gently cleanse wound with antibacterial soap, rinse  and pat dry prior to dressing wounds Peri-Wound Care Topical Primary [address] FRANCISCOJAVIER, [name] ([id number]) [id number] Page 7 of 16 Xeroform-HBD 2x2 (in/in) Discharge Instruction: Apply Xeroform-HBD 2x2 (in/in) as directed Secondary Dressing (BORDER) Zetuvit Plus SILICONE BORDER Dressing 5x5 (in/in) Discharge Instruction: Please do not put silicone bordered dressings under wraps. Use non-bordered dressing only. Secured With Compression Wrap Compression Stockings Add-Ons Wound #3 (Foot) Wound Laterality: Right, Lateral Cleanser Soap and Water Discharge Instruction: Gently cleanse wound with antibacterial soap, rinse and pat dry prior to dressing wounds Peri-Wound Care Topical Primary Dressing Xeroform-HBD 2x2 (in/in) Discharge Instruction: Apply Xeroform-HBD 2x2 (in/in) as directed Secondary Dressing (BORDER) Zetuvit Plus SILICONE BORDER Dressing 5x5 (in/in) Discharge Instruction: Please do not put silicone bordered dressings under wraps. Use non-bordered dressing only. Secured With Compression Wrap Compression Stockings Add-Ons Wound #4 (Foot) Wound Laterality: Right, Medial Cleanser Soap and Water Discharge Instruction: Gently cleanse wound with antibacterial soap, rinse and pat dry prior to dressing wounds Peri-Wound Care Topical Primary Dressing Xeroform-HBD 2x2 (in/in) Discharge Instruction: Apply Xeroform-HBD 2x2 (in/in) as directed Secondary Dressing (BORDER) Zetuvit Plus SILICONE BORDER Dressing 5x5 (in/in) Discharge Instruction: Please do not put silicone bordered dressings under wraps. Use non-bordered dressing only. Secured With Compression Wrap Compression Stockings Facilities manager) Signed: 06/21/2023 9:40:01 AM By: Midge Aver MSN RN CNS WTA Previous Signature: 06/20/2023 3:57:24 PM Version By: Midge Aver MSN RN CNS WTA Entered By: Midge Aver on 06/21/2023 09:40:01 COCHISE, DINNEEN D ([id number])  [id number] Page 8 of 16 -------------------------------------------------------------------------------- Multi-Disciplinary Care Plan Details Patient Name: Date of Service: [name], [name] 06/20/2023 2:00 PM Medical Record Number: [id number] Patient Account Number: [id number] Date of Birth/Sex: Treating RN: [birth year] (82 y.o. Male) Midge Aver Primary Care Rameses Ou: Debbra Riding Other Clinician: Referring Chava Dulac: Treating Deantre Bourdon/Extender: Baxter Kail Weeks in Treatment: 0 Active Inactive Orientation to the Wound Care Program Nursing Diagnoses: Knowledge deficit related to the wound healing center program Goals: Patient/caregiver will verbalize understanding of the Wound Healing Center Program Date Initiated: 06/20/2023 Target Resolution Date: 06/27/2023 Goal Status: Active Interventions: Provide education on orientation to the wound center Notes: Wound/Skin Impairment Nursing Diagnoses: Impaired tissue integrity Knowledge deficit related to ulceration/compromised skin integrity Goals: Patient/caregiver will verbalize understanding of skin care regimen Date Initiated: 06/20/2023 Target Resolution Date: 07/21/2023 Goal Status: Active Ulcer/skin breakdown will have a volume reduction of 30% by week 4 Date Initiated: 06/20/2023 Target Resolution Date: 07/21/2023 Goal Status: Active Ulcer/skin breakdown will have a volume reduction of 50% by week 8 Date Initiated: 06/20/2023 Target Resolution Date: 08/20/2023 Goal Status: Active Ulcer/skin breakdown will have a volume reduction of 80% by week 12 Date Initiated: 06/20/2023 Target Resolution Date: 09/20/2023 Goal Status: Active Ulcer/skin breakdown will heal within 14 weeks Date Initiated: 06/20/2023 Target Resolution Date: 10/04/2023 Goal Status: Active Interventions: Assess patient/caregiver ability to obtain necessary supplies Assess patient/caregiver ability to perform  ulcer/skin care regimen upon admission and as needed Assess ulceration(s) every visit Provide education on ulcer and skin care Treatment Activities: Referred to DME Pedrohenrique [place]  in/in) Discharge Instruction: Please do not put silicone bordered dressings under wraps. Use non-bordered dressing only. Secured With Compression Wrap Compression Stockings Facilities manager) Signed: 06/21/2023 1:36:59 PM By:  Midge Aver MSN RN CNS WTA Entered By: Midge Aver on 06/20/2023 14:45:42 -------------------------------------------------------------------------------- Wound Assessment Details Patient Name: Date of Service: [name], [name] RD D. 06/20/2023 2:00 PM Medical Record Number: [id number] Patient Account Number: [id number] Date of Birth/Sex: Treating RN: 07-26-1941 (82 y.o. Male) Midge Aver Primary Care Dwyane Dupree: Debbra Riding Other Clinician: Referring Leonell Lobdell: Treating Glorine Hanratty/Extender: Geryl Councilman in Treatment: 0 Wound Status Wound Number: 4 Primary Diabetic Wound/Ulcer of the Lower Extremity Etiology: Wound Location: Right, Medial Foot Wound Open Wounding Event: Footwear Injury Status: Date Acquired: 06/16/2023 Comorbid Congestive Heart Failure, Hypertension, Peripheral Venous Weeks Of Treatment: 0 History: Disease, Type II Diabetes, Neuropathy Clustered Wound: No MILAS, SCHAPPELL ([id number]) [id number] Page 15 of 16 Photos Wound Measurements Length: (cm) 0.7 Width: (cm) 0.4 Depth: (cm) 0.1 Area: (cm) 0.22 Volume: (cm) 0.022 % Reduction in Area: % Reduction in Volume: Epithelialization: Small (1-33%) Tunneling: No Undermining: No Wound Description Classification: Grade 2 Exudate Amount: Medium Exudate Type: Serosanguineous Exudate Color: red, brown Foul Odor After Cleansing: No Slough/Fibrino No Wound Bed Granulation Amount: Small (1-33%) Exposed Structure Granulation Quality: Pale Fascia Exposed: No Necrotic Amount: None Present (0%) Fat Layer (Subcutaneous Tissue) Exposed: Yes Tendon Exposed: No Muscle Exposed: No Joint Exposed: No Bone Exposed: No Treatment Notes Wound #4 (Foot) Wound Laterality: Right, Medial Cleanser Soap and Water Discharge Instruction: Gently cleanse wound with antibacterial soap, rinse and pat dry prior to dressing wounds Peri-Wound Care Topical Primary Dressing Xeroform-HBD 2x2  (in/in) Discharge Instruction: Apply Xeroform-HBD 2x2 (in/in) as directed Secondary Dressing (BORDER) Zetuvit Plus SILICONE BORDER Dressing 5x5 (in/in) Discharge Instruction: Please do not put silicone bordered dressings under wraps. Use non-bordered dressing only. Secured With Compression Wrap Compression Stockings Facilities manager) Signed: 06/21/2023 1:36:59 PM By: Midge Aver MSN RN CNS WTA Entered By: Midge Aver on 06/20/2023 14:46:59 Peter Congo D ([id number]) [id number] Page 16 of 16 -------------------------------------------------------------------------------- Vitals Details Patient Name: Date of Service: [name], [name] 06/20/2023 2:00 PM Medical Record Number: [id number] Patient Account Number: [id number] Date of Birth/Sex: Treating RN: [birth year] (82 y.o. Male) Midge Aver Primary Care Eleanora Guinyard: Debbra Riding Other Clinician: Referring Kandice Schmelter: Treating Antonio Creswell/Extender: Baxter Kail Weeks in Treatment: 0 Vital Signs Time Taken: 14:09 Temperature (F): 98.1 Height (in): 68 Pulse (bpm): 66 Source: Stated Respiratory Rate (breaths/min): 18 Weight (lbs): 167 Blood Pressure (mmHg): 178/61 Source: Stated Reference Range: 80 - 120 mg / dl Body Mass Index (BMI): 25.4 Electronic Signature(s) Signed: 06/21/2023 9:38:41 AM By: Midge Aver MSN RN CNS WTA Previous Signature: 06/20/2023 3:57:08 PM Version By: Midge Aver MSN RN CNS WTA Entered By: Midge Aver on 06/21/2023 09:38:40  for dressing supplies : 06/20/2023 Skin care regimen initiated : 06/20/2023 Topical wound management initiated : 06/20/2023 Notes: QUASEAN, FRYE ([id number]) [id number] Page 9 of 16 Electronic Signature(s) Signed: 06/21/2023 9:41:27 AM By: Midge Aver MSN RN CNS WTA Previous Signature: 06/20/2023 4:12:00 PM Version By: Midge Aver MSN RN CNS WTA Entered By: Midge Aver on 06/21/2023 09:41:26 -------------------------------------------------------------------------------- Pain Assessment Details Patient Name: Date of Service: [name], [place] RD D. 06/20/2023 2:00 PM Medical Record Number: [id number] Patient Account Number: [id number] Date of Birth/Sex: Treating RN: Aug 03, 1941 (82 y.o. Male) Midge Aver Primary Care Anneli Bing: Debbra Riding Other Clinician: Referring Kazi Reppond: Treating Leland Raver/Extender: Baxter Kail Weeks in Treatment: 0 Active Problems Location of Pain Severity and Description of Pain Patient Has Paino Yes Site Locations Rate the pain. Current Pain Level: 2 Pain Management and Medication Current Pain Management: Electronic Signature(s) Signed: 06/21/2023 9:38:31 AM By: Midge Aver MSN RN CNS WTA Previous Signature: 06/20/2023 3:57:04 PM Version By: Midge Aver MSN RN CNS WTA Entered By: Midge Aver on 06/21/2023 09:38:31 Patient/Caregiver Education Details -------------------------------------------------------------------------------- Peter Congo D ([id number]) [id number] Page 10 of 16 Patient Name: Date of Service: [name], [place] RD D. 10/3/2024andnbsp2:00 PM Medical Record Number: [id number] Patient Account Number: [id number] Date of Birth/Gender: Treating RN: 28-Aug-1941 (82 y.o. Male) Midge Aver Primary Care  Physician: Debbra Riding Other Clinician: Referring Physician: Treating Physician/Extender: Florentina Jenny in Treatment: 0 Education Assessment Education Provided To: Patient Education Topics Provided Welcome T [facility]-New Patient Packet: o Handouts: Welcome T [facility] o Methods: Explain/Verbal Responses: State content correctly Wound Debridement: Handouts: Wound Debridement Methods: Explain/Verbal Responses: State content correctly Wound/Skin Impairment: Handouts: Caring for Your Ulcer Methods: Explain/Verbal Responses: State content correctly Electronic Signature(s) Signed: 06/21/2023 1:36:59 PM By: Midge Aver MSN RN CNS WTA Entered By: Midge Aver on 06/21/2023 09:41:33 -------------------------------------------------------------------------------- Wound Assessment Details Patient Name: Date of Service: [name], [name] RD D. 06/20/2023 2:00 PM Medical Record Number: [id number] Patient Account Number: [id number] Date of Birth/Sex: Treating RN: September 22, 1940 (82 y.o. 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 Wound Open Wounding Event: Surgical Injury Status: Date Acquired: 04/16/2023 Comorbid Congestive Heart Failure, Hypertension, Peripheral Venous Weeks Of Treatment: 0 History: Disease, Type II Diabetes, Neuropathy Clustered Wound: No Photos DERELLE, COCKRELL D ([id number]) [id number] Page 11 of 16 Wound Measurements Length: (cm) 1.5 Width: (cm) 1 Depth: (cm) 1.3 Area: (cm) 1.178 Volume: (cm) 1.532 % Reduction in Area: % Reduction in Volume: Epithelialization: None Tunneling: No Undermining: No Wound Description Classification: Full Thickness Without Exposed Support  Structures Exudate Amount: Medium Exudate Type: Serosanguineous Exudate Color: red, brown Foul Odor After Cleansing: No Slough/Fibrino No Wound Bed Granulation Amount: Medium (34-66%) Exposed Structure Granulation Quality: Pink Fascia Exposed: No Fat Layer (Subcutaneous Tissue) Exposed: Yes Tendon Exposed: No Muscle Exposed: No Joint Exposed: No Bone Exposed: No Treatment Notes Wound #1 (Amputation Site - Toe) Wound Laterality: Right Cleanser Byram Ancillary Kit - 15 Day Supply Discharge Instruction: Use supplies as instructed; Kit contains: (15) Saline Bullets; (15) 3x3 Gauze; 15 pr Gloves Dakin 16 (oz) 0.25 Discharge Instruction: Use as directed. Peri-Wound Care Topical Primary Dressing Gauze Discharge Instruction: As directed: dry, moistened with saline or moistened with Dakins Solution Secondary Dressing Conforming Guaze Roll-Large Discharge Instruction: Apply Conforming Stretch Guaze Bandage as directed Secured With  0.25 Discharge Instruction: Use as directed. Peri-Wound Care Topical Primary Dressing Gauze Discharge Instruction: As directed: dry, moistened with saline or moistened with Dakins Solution Secondary Dressing Conforming Guaze Roll-Large Discharge Instruction: Apply Conforming Stretch Guaze Bandage as directed Secured With Medipore T - 6M Medipore H Soft Cloth Surgical T ape ape, 2x2 (in/yd) Compression Wrap Compression Stockings Add-Ons Wound #2 (Foot) Wound Laterality: Plantar, Right Cleanser Soap and Water Discharge Instruction: Gently cleanse wound with antibacterial soap, rinse  and pat dry prior to dressing wounds Peri-Wound Care Topical Primary [address] FRANCISCOJAVIER, [name] ([id number]) [id number] Page 7 of 16 Xeroform-HBD 2x2 (in/in) Discharge Instruction: Apply Xeroform-HBD 2x2 (in/in) as directed Secondary Dressing (BORDER) Zetuvit Plus SILICONE BORDER Dressing 5x5 (in/in) Discharge Instruction: Please do not put silicone bordered dressings under wraps. Use non-bordered dressing only. Secured With Compression Wrap Compression Stockings Add-Ons Wound #3 (Foot) Wound Laterality: Right, Lateral Cleanser Soap and Water Discharge Instruction: Gently cleanse wound with antibacterial soap, rinse and pat dry prior to dressing wounds Peri-Wound Care Topical Primary Dressing Xeroform-HBD 2x2 (in/in) Discharge Instruction: Apply Xeroform-HBD 2x2 (in/in) as directed Secondary Dressing (BORDER) Zetuvit Plus SILICONE BORDER Dressing 5x5 (in/in) Discharge Instruction: Please do not put silicone bordered dressings under wraps. Use non-bordered dressing only. Secured With Compression Wrap Compression Stockings Add-Ons Wound #4 (Foot) Wound Laterality: Right, Medial Cleanser Soap and Water Discharge Instruction: Gently cleanse wound with antibacterial soap, rinse and pat dry prior to dressing wounds Peri-Wound Care Topical Primary Dressing Xeroform-HBD 2x2 (in/in) Discharge Instruction: Apply Xeroform-HBD 2x2 (in/in) as directed Secondary Dressing (BORDER) Zetuvit Plus SILICONE BORDER Dressing 5x5 (in/in) Discharge Instruction: Please do not put silicone bordered dressings under wraps. Use non-bordered dressing only. Secured With Compression Wrap Compression Stockings Facilities manager) Signed: 06/21/2023 9:40:01 AM By: Midge Aver MSN RN CNS WTA Previous Signature: 06/20/2023 3:57:24 PM Version By: Midge Aver MSN RN CNS WTA Entered By: Midge Aver on 06/21/2023 09:40:01 COCHISE, DINNEEN D ([id number])  [id number] Page 8 of 16 -------------------------------------------------------------------------------- Multi-Disciplinary Care Plan Details Patient Name: Date of Service: [name], [name] 06/20/2023 2:00 PM Medical Record Number: [id number] Patient Account Number: [id number] Date of Birth/Sex: Treating RN: [birth year] (82 y.o. Male) Midge Aver Primary Care Rameses Ou: Debbra Riding Other Clinician: Referring Chava Dulac: Treating Deantre Bourdon/Extender: Baxter Kail Weeks in Treatment: 0 Active Inactive Orientation to the Wound Care Program Nursing Diagnoses: Knowledge deficit related to the wound healing center program Goals: Patient/caregiver will verbalize understanding of the Wound Healing Center Program Date Initiated: 06/20/2023 Target Resolution Date: 06/27/2023 Goal Status: Active Interventions: Provide education on orientation to the wound center Notes: Wound/Skin Impairment Nursing Diagnoses: Impaired tissue integrity Knowledge deficit related to ulceration/compromised skin integrity Goals: Patient/caregiver will verbalize understanding of skin care regimen Date Initiated: 06/20/2023 Target Resolution Date: 07/21/2023 Goal Status: Active Ulcer/skin breakdown will have a volume reduction of 30% by week 4 Date Initiated: 06/20/2023 Target Resolution Date: 07/21/2023 Goal Status: Active Ulcer/skin breakdown will have a volume reduction of 50% by week 8 Date Initiated: 06/20/2023 Target Resolution Date: 08/20/2023 Goal Status: Active Ulcer/skin breakdown will have a volume reduction of 80% by week 12 Date Initiated: 06/20/2023 Target Resolution Date: 09/20/2023 Goal Status: Active Ulcer/skin breakdown will heal within 14 weeks Date Initiated: 06/20/2023 Target Resolution Date: 10/04/2023 Goal Status: Active Interventions: Assess patient/caregiver ability to obtain necessary supplies Assess patient/caregiver ability to perform  ulcer/skin care regimen upon admission and as needed Assess ulceration(s) every visit Provide education on ulcer and skin care Treatment Activities: Referred to DME Pedrohenrique [place]  CHARLIE, SEDA D ([id number]) [id number] Page 1 of 16 Visit Report for 06/20/2023 Allergy List Details Patient Name: Date of Service: [name], [name] 06/20/2023 2:00 PM Medical Record Number: [id number] Patient Account Number: [id number] Date of Birth/Sex: Treating RN: Jan 18, 1941 (82 y.o. Male) Midge Aver Primary Care Zinnia Tindall: Debbra Riding Other Clinician: Referring Sydny Schnitzler: Treating Edilberto Roosevelt/Extender: Baxter Kail Weeks in Treatment: 0 Allergies Active Allergies HIB vaccine Type: Medication Flu vaccine Type: Medication Allergy Notes Electronic Signature(s) Signed: 06/21/2023 9:39:03 AM By: Midge Aver MSN RN CNS WTA Entered By: Midge Aver on 06/21/2023 09:39:03 -------------------------------------------------------------------------------- Arrival Information Details Patient Name: Date of Service: [name], [place] RD D. 06/20/2023 2:00 PM Medical Record Number: [id number] Patient Account Number: [id number] Date of Birth/Sex: Treating RN: September 18, 1940 (82 y.o. Male) Midge Aver Primary Care Magaline Steinberg: Debbra Riding Other Clinician: Referring Jazzy Parmer: Treating Rameen Gohlke/Extender: Florentina Jenny in Treatment: 0 Visit Information Patient Arrived: Danella Maiers Time: 14:08 Accompanied By: daughter in law Transfer Assistance: None Patient Identification Verified: Yes Secondary Verification Process Completed: Yes Patient Requires Transmission-Based Precautions: No Patient Has Alerts: Yes Patient Alerts: Patient on Blood Thinner diabetes type 2 Plavix SAMIR, ISHAQ ([id number]) [id number] Page 2 of 16 Electronic Signature(s) Signed: 06/21/2023 9:38:21 AM By: Midge Aver MSN RN CNS WTA Previous Signature: 06/20/2023 3:57:00 PM Version By: Midge Aver MSN RN CNS WTA Entered By: Midge Aver on 06/21/2023  09:38:20 -------------------------------------------------------------------------------- Clinic Level of Care Assessment Details Patient Name: Date of Service: [name], [name] 06/20/2023 2:00 PM Medical Record Number: [id number] Patient Account Number: [id number] Date of Birth/Sex: Treating RN: 07/17/1941 (82 y.o. Male) Midge Aver Primary Care Kennard Fildes: Debbra Riding Other Clinician: Referring Roshonda Sperl: Treating Carols Clemence/Extender: Baxter Kail Weeks in Treatment: 0 Clinic Level of Care Assessment Items TOOL 2 Quantity Score X- 1 0 Use when only an EandM is performed on the INITIAL visit ASSESSMENTS - Nursing Assessment / Reassessment X- 1 20 General Physical Exam (combine w/ comprehensive assessment (listed just below) when performed on new pt. evals) X- 1 25 Comprehensive Assessment (HX, ROS, Risk Assessments, Wounds Hx, etc.) ASSESSMENTS - Wound and Skin A ssessment / Reassessment []  - 0 Simple Wound Assessment / Reassessment - one wound X- 4 5 Complex Wound Assessment / Reassessment - multiple wounds []  - 0 Dermatologic / Skin Assessment (not related to wound area) ASSESSMENTS - Ostomy and/or Continence Assessment and Care []  - 0 Incontinence Assessment and Management []  - 0 Ostomy Care Assessment and Management (repouching, etc.) PROCESS - Coordination of Care []  - 0 Simple Patient / Family Education for ongoing care X- 1 20 Complex (extensive) Patient / Family Education for ongoing care X- 1 10 Staff obtains Chiropractor, Records, T Results / Process Orders est []  - 0 Staff telephones HHA, Nursing Homes / Clarify orders / etc []  - 0 Routine Transfer to another Facility (non-emergent condition) []  - 0 Routine Hospital Admission (non-emergent condition) X- 1 15 New Admissions / Manufacturing engineer / Ordering NPWT Apligraf, etc. , []  - 0 Emergency Hospital Admission (emergent condition) []  - 0 Simple Discharge Coordination X- 1 15 Complex  (extensive) Discharge Coordination PROCESS - Special Needs []  - 0 Pediatric / Minor Patient Management []  - 0 Isolation Patient Management []  - 0 Hearing / Language / Visual special needs GEORGIE, HAQUE D ([id number]) [id number] Page 3 of 16 []  - 0 Assessment of Community assistance (transportation, D/C planning, etc.) []  - 0 Additional assistance / Altered mentation []  - 0 Support  Surface(s) Assessment (bed, cushion, seat, etc.) INTERVENTIONS - Wound Cleansing / Measurement X- 1 5 Wound Imaging (photographs - any number of wounds) []  - 0 Wound Tracing (instead of photographs) []  - 0 Simple Wound Measurement - one wound X- 4 5 Complex Wound Measurement - multiple wounds []  - 0 Simple Wound Cleansing - one wound X- 4 5 Complex Wound Cleansing - multiple wounds INTERVENTIONS - Wound Dressings X - Small Wound Dressing one or multiple wounds 4 10 []  - 0 Medium Wound Dressing one or multiple wounds []  - 0 Large Wound Dressing one or multiple wounds []  - 0 Application of Medications - injection INTERVENTIONS - Miscellaneous []  - 0 External ear exam []  - 0 Specimen Collection (cultures, biopsies, blood, body fluids, etc.) []  - 0 Specimen(s) / Culture(s) sent or taken to Lab for analysis []  - 0 Patient Transfer (multiple staff / Nurse, adult / Similar devices) []  - 0 Simple Staple / Suture removal (25 or less) []  - 0 Complex Staple / Suture removal (26 or more) []  - 0 Hypo / Hyperglycemic Management (close monitor of Blood Glucose) []  - 0 Ankle / Brachial Index (ABI) - do not check if billed separately Has the patient been seen at the hospital within the last three years: Yes Total Score: 210 Level Of Care: New/Established - Level 5 Electronic Signature(s) Signed: 06/21/2023 1:36:59 PM By: Midge Aver MSN RN CNS WTA Entered By: Midge Aver on 06/21/2023  09:41:07 -------------------------------------------------------------------------------- Encounter Discharge Information Details Patient Name: Date of Service: [name], [place] RD D. 06/20/2023 2:00 PM Medical Record Number: [id number] Patient Account Number: [id number] Date of Birth/Sex: Treating RN: 05/21/1941 (82 y.o. Male) Midge Aver Primary Care Carliss Porcaro: Debbra Riding Other Clinician: Referring Olamide Lahaie: Treating Even Budlong/Extender: Baxter Kail Weeks in Treatment: 0 Encounter Discharge Information Items Post Procedure Vitals Discharge Condition: Stable Temperature (F): 98.1 Ambulatory Status: Cane Pulse (bpm): 66 Discharge Destination: Home Respiratory Rate (breaths/min): [address] D ([id number]) [id number] Page 4 of 16 Transportation: Private Auto Blood Pressure (mmHg): 178/61 Accompanied By: Daughter in law Schedule Follow-up Appointment: Yes Clinical Summary of Care: Electronic Signature(s) Signed: 06/21/2023 9:42:27 AM By: Midge Aver MSN RN CNS WTA Previous Signature: 06/20/2023 4:13:54 PM Version By: Midge Aver MSN RN CNS WTA Entered By: Midge Aver on 06/21/2023 09:42:27 -------------------------------------------------------------------------------- Lower Extremity Assessment Details Patient Name: Date of Service: [name], HO [place] RD D. 06/20/2023 2:00 PM Medical Record Number: [id number] Patient Account Number: [id number] Date of Birth/Sex: Treating RN: 1941/01/25 (82 y.o. Male) Midge Aver Primary Care Rockwell Zentz: Debbra Riding Other Clinician: Referring Darlena Koval: Treating Denessa Cavan/Extender: Baxter Kail Weeks in Treatment: 0 Vascular Assessment Pulses: Dorsalis Pedis Palpable: [Right:Yes] Extremity colors, hair growth, and conditions: Extremity Color: [Right:Normal] Hair Growth on Extremity: [Right:Yes] Temperature of Extremity: [Right:Warm] Capillary Refill: [Right:> 3 seconds] Dependent  Rubor: [Right:No] Blanched when Elevated: [Right:No No] Toe Nail Assessment Left: Right: Thick: Yes Discolored: Yes Deformed: Yes Improper Length and Hygiene: Yes Electronic Signature(s) Signed: 06/21/2023 9:38:56 AM By: Midge Aver MSN RN CNS WTA Previous Signature: 06/20/2023 3:57:15 PM Version By: Midge Aver MSN RN CNS WTA Entered By: Midge Aver on 06/21/2023 09:38:56 Multi Wound Chart Details -------------------------------------------------------------------------------- Cammy Brochure ([id number]) [id number] Page 5 of 16 Patient Name: Date of Service: [name], [name] 06/20/2023 2:00 PM Medical Record Number: [id number] Patient Account Number: [id number] Date of Birth/Sex: Treating RN: 04-07-1941 (82 y.o. Male) Midge Aver Primary Care Marena Witts: Debbra Riding Other Clinician: Referring Kerrington Greenhalgh: Treating Eleisha Branscomb/Extender: Allen Derry  Surface(s) Assessment (bed, cushion, seat, etc.) INTERVENTIONS - Wound Cleansing / Measurement X- 1 5 Wound Imaging (photographs - any number of wounds) []  - 0 Wound Tracing (instead of photographs) []  - 0 Simple Wound Measurement - one wound X- 4 5 Complex Wound Measurement - multiple wounds []  - 0 Simple Wound Cleansing - one wound X- 4 5 Complex Wound Cleansing - multiple wounds INTERVENTIONS - Wound Dressings X - Small Wound Dressing one or multiple wounds 4 10 []  - 0 Medium Wound Dressing one or multiple wounds []  - 0 Large Wound Dressing one or multiple wounds []  - 0 Application of Medications - injection INTERVENTIONS - Miscellaneous []  - 0 External ear exam []  - 0 Specimen Collection (cultures, biopsies, blood, body fluids, etc.) []  - 0 Specimen(s) / Culture(s) sent or taken to Lab for analysis []  - 0 Patient Transfer (multiple staff / Nurse, adult / Similar devices) []  - 0 Simple Staple / Suture removal (25 or less) []  - 0 Complex Staple / Suture removal (26 or more) []  - 0 Hypo / Hyperglycemic Management (close monitor of Blood Glucose) []  - 0 Ankle / Brachial Index (ABI) - do not check if billed separately Has the patient been seen at the hospital within the last three years: Yes Total Score: 210 Level Of Care: New/Established - Level 5 Electronic Signature(s) Signed: 06/21/2023 1:36:59 PM By: Midge Aver MSN RN CNS WTA Entered By: Midge Aver on 06/21/2023  09:41:07 -------------------------------------------------------------------------------- Encounter Discharge Information Details Patient Name: Date of Service: [name], HO [place] RD D. 06/20/2023 2:00 PM Medical Record Number: [id number] Patient Account Number: [id number] Date of Birth/Sex: Treating RN: 05/21/1941 (82 y.o. Male) Midge Aver Primary Care Carliss Porcaro: Debbra Riding Other Clinician: Referring Olamide Lahaie: Treating Even Budlong/Extender: Baxter Kail Weeks in Treatment: 0 Encounter Discharge Information Items Post Procedure Vitals Discharge Condition: Stable Temperature (F): 98.1 Ambulatory Status: Cane Pulse (bpm): 66 Discharge Destination: Home Respiratory Rate (breaths/min): [address] D ([id number]) [id number] Page 4 of 16 Transportation: Private Auto Blood Pressure (mmHg): 178/61 Accompanied By: Daughter in law Schedule Follow-up Appointment: Yes Clinical Summary of Care: Electronic Signature(s) Signed: 06/21/2023 9:42:27 AM By: Midge Aver MSN RN CNS WTA Previous Signature: 06/20/2023 4:13:54 PM Version By: Midge Aver MSN RN CNS WTA Entered By: Midge Aver on 06/21/2023 09:42:27 -------------------------------------------------------------------------------- Lower Extremity Assessment Details Patient Name: Date of Service: [name], HO [place] RD D. 06/20/2023 2:00 PM Medical Record Number: [id number] Patient Account Number: [id number] Date of Birth/Sex: Treating RN: 1941/01/25 (82 y.o. Male) Midge Aver Primary Care Rockwell Zentz: Debbra Riding Other Clinician: Referring Darlena Koval: Treating Denessa Cavan/Extender: Baxter Kail Weeks in Treatment: 0 Vascular Assessment Pulses: Dorsalis Pedis Palpable: [Right:Yes] Extremity colors, hair growth, and conditions: Extremity Color: [Right:Normal] Hair Growth on Extremity: [Right:Yes] Temperature of Extremity: [Right:Warm] Capillary Refill: [Right:> 3 seconds] Dependent  Rubor: [Right:No] Blanched when Elevated: [Right:No No] Toe Nail Assessment Left: Right: Thick: Yes Discolored: Yes Deformed: Yes Improper Length and Hygiene: Yes Electronic Signature(s) Signed: 06/21/2023 9:38:56 AM By: Midge Aver MSN RN CNS WTA Previous Signature: 06/20/2023 3:57:15 PM Version By: Midge Aver MSN RN CNS WTA Entered By: Midge Aver on 06/21/2023 09:38:56 Multi Wound Chart Details -------------------------------------------------------------------------------- Cammy Brochure ([id number]) [id number] Page 5 of 16 Patient Name: Date of Service: [name], [name] 06/20/2023 2:00 PM Medical Record Number: [id number] Patient Account Number: [id number] Date of Birth/Sex: Treating RN: 04-07-1941 (82 y.o. Male) Midge Aver Primary Care Marena Witts: Debbra Riding Other Clinician: Referring Kerrington Greenhalgh: Treating Eleisha Branscomb/Extender: Allen Derry

## 2023-06-21 NOTE — Progress Notes (Signed)
JEDIDIAH, DEMARTINI ([id number]) [phone] [id number] Page 1 of 5 Visit Report for 06/20/2023 Abuse Risk Screen Details Patient Name: Date of Service: [name], [name] 06/20/2023 2:00 PM Medical Record Number: [id number] Patient Account Number: [id number] Date of Birth/Sex: Treating RN: 1941-08-04 (82 y.o. Male) Midge Aver Primary Care Neta Upadhyay: Debbra Riding Other Clinician: Referring Lyliana Dicenso: Treating Avish Torry/Extender: Baxter Kail Weeks in Treatment: 0 Abuse Risk Screen Items Answer Electronic Signature(s) Signed: 06/21/2023 9:39:21 AM By: Midge Aver MSN RN CNS WTA Entered By: Midge Aver on 06/21/2023 06:39:21 -------------------------------------------------------------------------------- Activities of Daily Living Details Patient Name: Date of Service: [name], [name] 06/20/2023 2:00 PM Medical Record Number: [id number] Patient Account Number: [id number] Date of Birth/Sex: Treating RN: 1941/01/28 (82 y.o. Male) Midge Aver Primary Care Kearston Putman: Debbra Riding Other Clinician: Referring Oskar Cretella: Treating Lynton Crescenzo/Extender: Baxter Kail Weeks in Treatment: 0 Activities of Daily Living Items Answer Activities of Daily Living (Please select one for each item) Drive Automobile Not Able T Medications ake Completely Able Use T elephone Completely Able Care for Appearance Completely Able Use T oilet Completely Able Bath / Shower Completely Able Dress Self Completely Able Feed Self Completely Able Walk Completely Able Get In / Out Bed Completely Able Housework Not Able Prepare Meals Completely Able Handle Money Need Assistance Shop for Self Need Assistance BRITTANY, AMIRAULT D ([id number]) [id number] Page 2 of 5 Electronic Signature(s) Signed: 06/21/2023 9:39:27 AM By: Midge Aver MSN RN CNS WTA Entered By: Midge Aver on 06/21/2023  06:39:27 -------------------------------------------------------------------------------- Education Screening Details Patient Name: Date of Service: [name], HO [place] RD D. 06/20/2023 2:00 PM Medical Record Number: [id number] Patient Account Number: [id number] Date of Birth/Sex: Treating RN: 30-Nov-1940 (82 y.o. Male) Midge Aver Primary Care Quetzalli Clos: Debbra Riding Other Clinician: Referring Toren Tucholski: Treating Nakeda Lebron/Extender: Florentina Jenny in Treatment: 0 Learning Preferences/Education Level/Primary Language Learning Preference: Explanation, Demonstration Preferred Language: English Cognitive Barrier Language Barrier: No Translator Needed: No Memory Deficit: No Emotional Barrier: No Cultural/Religious Beliefs Affecting Medical Care: No Physical Barrier Impaired Vision: Yes Glasses Impaired Hearing: Yes HOH Decreased Hand dexterity: No Knowledge/Comprehension Knowledge Level: High Comprehension Level: High Ability to understand written instructions: High Ability to understand verbal instructions: High Motivation Anxiety Level: Calm Cooperation: Cooperative Education Importance: Acknowledges Need Interest in Health Problems: Asks Questions Perception: Coherent Willingness to Engage in Self-Management High Activities: Readiness to Engage in Self-Management High Activities: Electronic Signature(s) Signed: 06/21/2023 9:39:34 AM By: Midge Aver MSN RN CNS WTA Entered By: Midge Aver on 06/21/2023 06:39:33 Peter Congo D ([id number]) [id number] Page 3 of 5 -------------------------------------------------------------------------------- Fall Risk Assessment Details Patient Name: Date of Service: [name], [name] 06/20/2023 2:00 PM Medical Record Number: [id number] Patient Account Number: [id number] Date of Birth/Sex: Treating RN: 1941-04-18 (82 y.o. Male) Midge Aver Primary Care Auna Mikkelsen: Debbra Riding Other  Clinician: Referring Sadaf Przybysz: Treating Leverne Tessler/Extender: Baxter Kail Weeks in Treatment: 0 Fall Risk Assessment Items Have you had 2 or more falls in the last 12 monthso 0 No Have you had any fall that resulted in injury in the last 12 monthso 0 No FALLS RISK SCREEN History of falling - immediate or within 3 months 0 No Secondary diagnosis (Do you have 2 or more medical diagnoseso) 0 No Ambulatory aid None/bed rest/wheelchair/nurse 0 No Crutches/cane/walker 15 Yes Furniture 0 No Intravenous therapy Access/Saline/Heparin Lock 0 No Gait/Transferring Normal/ bed rest/ wheelchair 0 No Weak (short steps with or without shuffle, stooped but able to lift  head while walking, may seek 0 No support from furniture) Impaired (short steps with shuffle, may have difficulty arising from chair, head down, impaired 20 Yes balance) Mental Status Oriented to own ability 0 Yes Electronic Signature(s) Signed: 06/21/2023 9:39:40 AM By: Midge Aver MSN RN CNS WTA Entered By: Midge Aver on 06/21/2023 06:39:39 -------------------------------------------------------------------------------- Foot Assessment Details Patient Name: Date of Service: [name], [name] RD D. 06/20/2023 2:00 PM Medical Record Number: [id number] Patient Account Number: [id number] Date of Birth/Sex: Treating RN: [birth year] (82 y.o. Male) Midge Aver Primary Care Moss Berry: Debbra Riding Other Clinician: Referring Tannis Burstein: Treating Armin Yerger/Extender: Baxter Kail Weeks in Treatment: 0 Foot Assessment Items Site Locations [place], [place] D ([id number]) [phone] [id number] Page 4 of 5 + = Sensation present, - = Sensation absent, C = Callus, U = Ulcer R = Redness, W = Warmth, M = Maceration, PU = Pre-ulcerative lesion F = Fissure, S = Swelling, D = Dryness Assessment Right: Left: Other Deformity: No No Prior Foot Ulcer: Yes No Prior Amputation: No No Charcot Joint: No  No Ambulatory Status: Ambulatory With Help Assistance Device: Cane GaitFutures trader) Signed: 06/21/2023 9:39:51 AM By: Midge Aver MSN RN CNS WTA Entered By: Midge Aver on 06/21/2023 06:39:51 -------------------------------------------------------------------------------- Nutrition Risk Screening Details Patient Name: Date of Service: [name], [name] RD D. 06/20/2023 2:00 PM Medical Record Number: [id number] Patient Account Number: [id number] Date of Birth/Sex: Treating RN: 09-Jul-1941 (82 y.o. Male) Midge Aver Primary Care Rihaan Barrack: Debbra Riding Other Clinician: Referring Lindy Garczynski: Treating Sigismund Cross/Extender: Baxter Kail Weeks in Treatment: 0 Height (in): 68 Weight (lbs): 167 Body Mass Index (BMI): 25.4 Nutrition Risk Screening Items Score Screening NUTRITION RISK SCREEN: I have an illness or condition that made me change the kind and/or amount of food I eat 0 No I eat fewer than two meals per day 0 No I eat few fruits and vegetables, or milk products 0 No I have three or more drinks of beer, liquor or wine almost every day 0 No I have tooth or mouth problems that make it hard for me to eat 0 No AURON, TADROS D ([id number]) [phone] [id number] Page 5 of 5 I don't always have enough money to buy the food I need 0 No I eat alone most of the time 0 No I take three or more different prescribed or over-the-counter drugs a day 1 Yes Without wanting to, I have lost or gained 10 pounds in the last six months 0 No I am not always physically able to shop, cook and/or feed myself 0 No Nutrition Protocols Good Risk Protocol 0 No interventions needed Moderate Risk Protocol High Risk Proctocol Risk Level: Good Risk Score: 1 Electronic Signature(s) Signed: 06/21/2023 9:39:45 AM By: Midge Aver MSN RN CNS WTA Entered By: Midge Aver on 06/21/2023 06:39:45

## 2023-06-21 NOTE — Progress Notes (Signed)
Description Active Date MDM Diagnosis T81.31XA Disruption of external operation (surgical) wound, not elsewhere classified, 06/20/2023 No Yes initial encounter E11.621 Type 2 diabetes mellitus with foot ulcer 06/20/2023 No Yes L97.512  Non-pressure chronic ulcer of other part of right foot with fat layer exposed 06/20/2023 No Yes I10 Essential (primary) hypertension 06/20/2023 No Yes 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 06/20/2023 No Yes I25.10 Atherosclerotic heart disease of native coronary artery without angina pectoris 06/20/2023 No Yes TIMON, GEISSINGER ([id number]) [phone].pdf Page 7 of 9 Z79.01 Long term (current) use of anticoagulants 06/20/2023 No Yes 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, stage 3 unspecified 06/20/2023 No Yes Inactive Problems Resolved Problems Electronic Signature(s) Signed: 06/21/2023 9:41:48 AM By: Midge Aver MSN RN CNS WTA Signed: 06/21/2023 2:08:40 PM By: Allen Derry PA-C Previous Signature: 06/20/2023 2:59:17 PM Version By: Allen Derry PA-C Entered By: Midge Aver on 06/21/2023 09:41:47 -------------------------------------------------------------------------------- ROS/PFSH Details Patient Name: Date of Service: [name], HO [place] RD [name] 06/20/2023 2:00 PM Medical Record Number: [id number] Patient Account Number: [id number] Date of Birth/Sex: Treating RN: Jul 31, 1941 (82 y.o. Male) Midge Aver Primary Care Provider: Debbra Riding Other Clinician: Referring Provider: Treating Provider/Extender: Baxter Kail Weeks in Treatment: 0 Information Obtained From Patient Constitutional Symptoms (General Health) Complaints and Symptoms: Negative for: Fatigue; Fever; Chills; Marked Weight Change Genitourinary Complaints and Symptoms: Negative for: Kidney failure/ Dialysis; Incontinence/dribbling Medical History: Past Medical History Notes: CKD stage 3a Immunological Complaints and Symptoms: Negative for: Hives; Itching Integumentary (Skin) Complaints and Symptoms: Negative for: Wounds; Bleeding or bruising tendency; Breakdown; Swelling Musculoskeletal Complaints and Symptoms: Negative for: Muscle Pain; Muscle Weakness Psychiatric Complaints and  Symptoms: [name], [name] ([id number]) [phone].pdf Page 8 of 9 Negative for: Anxiety; Claustrophobia Cardiovascular Medical History: Positive for: Congestive Heart Failure; Hypertension; Peripheral Venous Disease Endocrine Medical History: Positive for: Type II Diabetes Time with diabetes: 8 years Treated with: Diet Blood sugar tested every day: Yes Tested : Blood sugar testing results: Breakfast: 95 Neurologic Medical History: Positive for: Neuropathy Oncologic Immunizations Pneumococcal Vaccine: Received Pneumococcal Vaccination: No Implantable Devices None Family and Social History Former smoker; Marital Status - Married; Alcohol Use: Rarely; Drug Use: No History; Caffeine Use: Moderate Electronic Signature(s) Signed: 06/21/2023 1:36:59 PM By: Midge Aver MSN RN CNS WTA Signed: 06/21/2023 2:08:40 PM By: Allen Derry PA-C Entered By: Midge Aver on 06/21/2023 09:39:11 -------------------------------------------------------------------------------- SuperBill Details Patient Name: Date of Service: [name], HO [place] RD [name] 06/20/2023 Medical Record Number: [id number] Patient Account Number: [id number] Date of Birth/Sex: Treating RN: [birth year] (82 y.o. Male) Midge Aver Primary Care Provider: Debbra Riding Other Clinician: Referring Provider: Treating Provider/Extender: Baxter Kail Weeks in Treatment: 0 Diagnosis Coding ICD-10 Codes Code Description T81.31XA Disruption of external operation (surgical) wound, not elsewhere classified, initial encounter E11.621 Type 2 diabetes mellitus with foot ulcer L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 I10 Essential (primary) hypertension 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 [name], [name] ([id number]) [id number] Page 9 of 9 I25.10 Atherosclerotic heart disease of native coronary artery without angina  pectoris Z79.01 Long term (current) use of anticoagulants 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, stage 3 unspecified Facility Procedures : CPT4 Code: 37169678 Description: 93810 - WOUND CARE VISIT-LEV 5 EST PT Modifier: Quantity: 1 Physician Procedures : CPT4 Code Description Modifier [id number] - WC PHYS LEVEL 4 - NEW PT ICD-10 Diagnosis Description T81.31XA Disruption of external operation (surgical) wound, not elsewhere classified, initial encounter E11.621 Type 2 diabetes mellitus with foot  ulcer L97.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  well Level of Consciousness (Post- Awake and Alert procedure): Post Debridement Measurements of Total Wound Length: (cm) 0.7 Width: (cm) 0.7 Depth: (cm) 0.1 Volume: (cm) 0.038 Character of Wound/Ulcer Post Debridement: Stable Severity of Tissue Post Debridement: Fat layer exposed Post Procedure Diagnosis Same as Pre-procedure Electronic Signature(s) Signed: 06/20/2023 4:04:04 PM By: Midge Aver MSN RN CNS WTA Entered By: Midge Aver on 06/20/2023 16:04:04 -------------------------------------------------------------------------------- Debridement Details Patient Name: Date of Service: [name], HO [place] RD [name] 06/20/2023 2:00 PM Medical Record Number: [id number] Patient Account Number: [id number] Date of Birth/Sex: Treating RN: [birth year] (82 y.o. Male) Midge Aver Primary Care Provider: Debbra Riding Other Clinician: Referring Provider: Treating Provider/Extender: Geryl Councilman in Treatment: [address] [name], [name] ([id number]) [id number] Page 4 of 9 Debridement Performed for Assessment: Wound #4 Right,Medial Foot Performed By: Physician , Debridement Type:  Chemical/Enzymatic/Mechanical Agent Used: Saline gauze Severity of Tissue Pre Debridement: Fat layer exposed Level of Consciousness (Pre-procedure): Awake and Alert Pre-procedure Verification/Time Out Yes - 15:50 Taken: Start Time: 15:50 Percent of Wound Bed Debrided: Instrument: Other : saline gauze Bleeding: None Procedural Pain: 0 Post Procedural Pain: 0 Response to Treatment: Procedure was tolerated well Level of Consciousness (Post- Awake and Alert procedure): Post Debridement Measurements of Total Wound Length: (cm) 0.7 Width: (cm) 0.4 Depth: (cm) 0.1 Volume: (cm) 0.022 Character of Wound/Ulcer Post Debridement: Stable Severity of Tissue Post Debridement: Fat layer exposed Post Procedure Diagnosis Same as Pre-procedure Electronic Signature(s) Signed: 06/20/2023 4:04:36 PM By: Midge Aver MSN RN CNS WTA Entered By: Midge Aver on 06/20/2023 16:04:36 -------------------------------------------------------------------------------- HPI Details Patient Name: Date of Service: [name], HO [place] RD [name] 06/20/2023 2:00 PM Medical Record Number: [id number] Patient Account Number: [id number] Date of Birth/Sex: Treating RN: 1940/11/27 (82 y.o. Male) Midge Aver Primary Care Provider: Debbra Riding Other Clinician: Referring Provider: Treating Provider/Extender: Gala Lewandowsky Weeks in Treatment: 0 History of Present Illness Chronic/Inactive Conditions Condition 1: Notes from Vascular 05/21/23: Right below-the-knee popliteal artery to peroneal artery bypass with reverse saphenous vein graft. This was done on 04/11/2023. Noninvasive studies in our an ABI 0.61 on the right and 0.67 on the left. T oday arterial duplexes were done which shows biphasic/triphasic waveforms throughout the lower extremities down to the level of the tibial vessels. 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 This is suggestive that the patient's bypass is patent although it is not there is no noted occlusion on duplex 1. Atherosclerosis of native arteries of the extremities with ulceration (HCC) Based on noninvasive studies today appears that the patient would have possible marginal ability for wound healing. Concerned that some of the patient is usually a small vessel disease which may proved to have a little bit more difficult. Will have the patient return in several weeks to see if there has been improvement. They are advised that if the wound deteriorates or begins to show signs of starting to contact us for sooner follow-up. Electronic Signature(s) Signed: 06/20/2023 5:40:24 PM By: Allen Derry PA-C Entered By: Allen Derry on 06/20/2023 17:40:24 [name] ([id number]) [id number] Page 5 of 9 -------------------------------------------------------------------------------- Physician Orders Details Patient Name: Date of Service: [name], [name] 06/20/2023 2:00 PM Medical Record Number: [id number] Patient Account Number: [id number] Date of Birth/Sex: Treating RN: [birth year] (82 y.o. Male) Midge Aver Primary Care Provider: Debbra Riding Other Clinician: Referring Provider: Treating Provider/Extender: Florentina Jenny in Treatment: 0 Verbal / Phone  Description Active Date MDM Diagnosis T81.31XA Disruption of external operation (surgical) wound, not elsewhere classified, 06/20/2023 No Yes initial encounter E11.621 Type 2 diabetes mellitus with foot ulcer 06/20/2023 No Yes L97.512  Non-pressure chronic ulcer of other part of right foot with fat layer exposed 06/20/2023 No Yes I10 Essential (primary) hypertension 06/20/2023 No Yes 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 06/20/2023 No Yes I25.10 Atherosclerotic heart disease of native coronary artery without angina pectoris 06/20/2023 No Yes TIMON, GEISSINGER ([id number]) [phone].pdf Page 7 of 9 Z79.01 Long term (current) use of anticoagulants 06/20/2023 No Yes 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, stage 3 unspecified 06/20/2023 No Yes Inactive Problems Resolved Problems Electronic Signature(s) Signed: 06/21/2023 9:41:48 AM By: Midge Aver MSN RN CNS WTA Signed: 06/21/2023 2:08:40 PM By: Allen Derry PA-C Previous Signature: 06/20/2023 2:59:17 PM Version By: Allen Derry PA-C Entered By: Midge Aver on 06/21/2023 09:41:47 -------------------------------------------------------------------------------- ROS/PFSH Details Patient Name: Date of Service: [name], HO [place] RD [name] 06/20/2023 2:00 PM Medical Record Number: [id number] Patient Account Number: [id number] Date of Birth/Sex: Treating RN: Jul 31, 1941 (82 y.o. Male) Midge Aver Primary Care Provider: Debbra Riding Other Clinician: Referring Provider: Treating Provider/Extender: Baxter Kail Weeks in Treatment: 0 Information Obtained From Patient Constitutional Symptoms (General Health) Complaints and Symptoms: Negative for: Fatigue; Fever; Chills; Marked Weight Change Genitourinary Complaints and Symptoms: Negative for: Kidney failure/ Dialysis; Incontinence/dribbling Medical History: Past Medical History Notes: CKD stage 3a Immunological Complaints and Symptoms: Negative for: Hives; Itching Integumentary (Skin) Complaints and Symptoms: Negative for: Wounds; Bleeding or bruising tendency; Breakdown; Swelling Musculoskeletal Complaints and Symptoms: Negative for: Muscle Pain; Muscle Weakness Psychiatric Complaints and  Symptoms: [name], [name] ([id number]) [phone].pdf Page 8 of 9 Negative for: Anxiety; Claustrophobia Cardiovascular Medical History: Positive for: Congestive Heart Failure; Hypertension; Peripheral Venous Disease Endocrine Medical History: Positive for: Type II Diabetes Time with diabetes: 8 years Treated with: Diet Blood sugar tested every day: Yes Tested : Blood sugar testing results: Breakfast: 95 Neurologic Medical History: Positive for: Neuropathy Oncologic Immunizations Pneumococcal Vaccine: Received Pneumococcal Vaccination: No Implantable Devices None Family and Social History Former smoker; Marital Status - Married; Alcohol Use: Rarely; Drug Use: No History; Caffeine Use: Moderate Electronic Signature(s) Signed: 06/21/2023 1:36:59 PM By: Midge Aver MSN RN CNS WTA Signed: 06/21/2023 2:08:40 PM By: Allen Derry PA-C Entered By: Midge Aver on 06/21/2023 09:39:11 -------------------------------------------------------------------------------- SuperBill Details Patient Name: Date of Service: [name], HO [place] RD [name] 06/20/2023 Medical Record Number: [id number] Patient Account Number: [id number] Date of Birth/Sex: Treating RN: [birth year] (82 y.o. Male) Midge Aver Primary Care Provider: Debbra Riding Other Clinician: Referring Provider: Treating Provider/Extender: Baxter Kail Weeks in Treatment: 0 Diagnosis Coding ICD-10 Codes Code Description T81.31XA Disruption of external operation (surgical) wound, not elsewhere classified, initial encounter E11.621 Type 2 diabetes mellitus with foot ulcer L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 I10 Essential (primary) hypertension 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 [name], [name] ([id number]) [id number] Page 9 of 9 I25.10 Atherosclerotic heart disease of native coronary artery without angina  pectoris Z79.01 Long term (current) use of anticoagulants 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, stage 3 unspecified Facility Procedures : CPT4 Code: 37169678 Description: 93810 - WOUND CARE VISIT-LEV 5 EST PT Modifier: Quantity: 1 Physician Procedures : CPT4 Code Description Modifier [id number] - WC PHYS LEVEL 4 - NEW PT ICD-10 Diagnosis Description T81.31XA Disruption of external operation (surgical) wound, not elsewhere classified, initial encounter E11.621 Type 2 diabetes mellitus with foot  ulcer L97.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  Description Active Date MDM Diagnosis T81.31XA Disruption of external operation (surgical) wound, not elsewhere classified, 06/20/2023 No Yes initial encounter E11.621 Type 2 diabetes mellitus with foot ulcer 06/20/2023 No Yes L97.512  Non-pressure chronic ulcer of other part of right foot with fat layer exposed 06/20/2023 No Yes I10 Essential (primary) hypertension 06/20/2023 No Yes 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 06/20/2023 No Yes I25.10 Atherosclerotic heart disease of native coronary artery without angina pectoris 06/20/2023 No Yes TIMON, GEISSINGER ([id number]) [phone].pdf Page 7 of 9 Z79.01 Long term (current) use of anticoagulants 06/20/2023 No Yes 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, stage 3 unspecified 06/20/2023 No Yes Inactive Problems Resolved Problems Electronic Signature(s) Signed: 06/21/2023 9:41:48 AM By: Midge Aver MSN RN CNS WTA Signed: 06/21/2023 2:08:40 PM By: Allen Derry PA-C Previous Signature: 06/20/2023 2:59:17 PM Version By: Allen Derry PA-C Entered By: Midge Aver on 06/21/2023 09:41:47 -------------------------------------------------------------------------------- ROS/PFSH Details Patient Name: Date of Service: [name], HO [place] RD [name] 06/20/2023 2:00 PM Medical Record Number: [id number] Patient Account Number: [id number] Date of Birth/Sex: Treating RN: Jul 31, 1941 (82 y.o. Male) Midge Aver Primary Care Provider: Debbra Riding Other Clinician: Referring Provider: Treating Provider/Extender: Baxter Kail Weeks in Treatment: 0 Information Obtained From Patient Constitutional Symptoms (General Health) Complaints and Symptoms: Negative for: Fatigue; Fever; Chills; Marked Weight Change Genitourinary Complaints and Symptoms: Negative for: Kidney failure/ Dialysis; Incontinence/dribbling Medical History: Past Medical History Notes: CKD stage 3a Immunological Complaints and Symptoms: Negative for: Hives; Itching Integumentary (Skin) Complaints and Symptoms: Negative for: Wounds; Bleeding or bruising tendency; Breakdown; Swelling Musculoskeletal Complaints and Symptoms: Negative for: Muscle Pain; Muscle Weakness Psychiatric Complaints and  Symptoms: [name], [name] ([id number]) [phone].pdf Page 8 of 9 Negative for: Anxiety; Claustrophobia Cardiovascular Medical History: Positive for: Congestive Heart Failure; Hypertension; Peripheral Venous Disease Endocrine Medical History: Positive for: Type II Diabetes Time with diabetes: 8 years Treated with: Diet Blood sugar tested every day: Yes Tested : Blood sugar testing results: Breakfast: 95 Neurologic Medical History: Positive for: Neuropathy Oncologic Immunizations Pneumococcal Vaccine: Received Pneumococcal Vaccination: No Implantable Devices None Family and Social History Former smoker; Marital Status - Married; Alcohol Use: Rarely; Drug Use: No History; Caffeine Use: Moderate Electronic Signature(s) Signed: 06/21/2023 1:36:59 PM By: Midge Aver MSN RN CNS WTA Signed: 06/21/2023 2:08:40 PM By: Allen Derry PA-C Entered By: Midge Aver on 06/21/2023 09:39:11 -------------------------------------------------------------------------------- SuperBill Details Patient Name: Date of Service: [name], HO [place] RD [name] 06/20/2023 Medical Record Number: [id number] Patient Account Number: [id number] Date of Birth/Sex: Treating RN: [birth year] (82 y.o. Male) Midge Aver Primary Care Provider: Debbra Riding Other Clinician: Referring Provider: Treating Provider/Extender: Baxter Kail Weeks in Treatment: 0 Diagnosis Coding ICD-10 Codes Code Description T81.31XA Disruption of external operation (surgical) wound, not elsewhere classified, initial encounter E11.621 Type 2 diabetes mellitus with foot ulcer L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 I10 Essential (primary) hypertension 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 [name], [name] ([id number]) [id number] Page 9 of 9 I25.10 Atherosclerotic heart disease of native coronary artery without angina  pectoris Z79.01 Long term (current) use of anticoagulants 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, stage 3 unspecified Facility Procedures : CPT4 Code: 37169678 Description: 93810 - WOUND CARE VISIT-LEV 5 EST PT Modifier: Quantity: 1 Physician Procedures : CPT4 Code Description Modifier [id number] - WC PHYS LEVEL 4 - NEW PT ICD-10 Diagnosis Description T81.31XA Disruption of external operation (surgical) wound, not elsewhere classified, initial encounter E11.621 Type 2 diabetes mellitus with foot  ulcer L97.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  well Level of Consciousness (Post- Awake and Alert procedure): Post Debridement Measurements of Total Wound Length: (cm) 0.7 Width: (cm) 0.7 Depth: (cm) 0.1 Volume: (cm) 0.038 Character of Wound/Ulcer Post Debridement: Stable Severity of Tissue Post Debridement: Fat layer exposed Post Procedure Diagnosis Same as Pre-procedure Electronic Signature(s) Signed: 06/20/2023 4:04:04 PM By: Midge Aver MSN RN CNS WTA Entered By: Midge Aver on 06/20/2023 16:04:04 -------------------------------------------------------------------------------- Debridement Details Patient Name: Date of Service: [name], HO [place] RD [name] 06/20/2023 2:00 PM Medical Record Number: [id number] Patient Account Number: [id number] Date of Birth/Sex: Treating RN: [birth year] (82 y.o. Male) Midge Aver Primary Care Provider: Debbra Riding Other Clinician: Referring Provider: Treating Provider/Extender: Geryl Councilman in Treatment: [address] [name], [name] ([id number]) [id number] Page 4 of 9 Debridement Performed for Assessment: Wound #4 Right,Medial Foot Performed By: Physician , Debridement Type:  Chemical/Enzymatic/Mechanical Agent Used: Saline gauze Severity of Tissue Pre Debridement: Fat layer exposed Level of Consciousness (Pre-procedure): Awake and Alert Pre-procedure Verification/Time Out Yes - 15:50 Taken: Start Time: 15:50 Percent of Wound Bed Debrided: Instrument: Other : saline gauze Bleeding: None Procedural Pain: 0 Post Procedural Pain: 0 Response to Treatment: Procedure was tolerated well Level of Consciousness (Post- Awake and Alert procedure): Post Debridement Measurements of Total Wound Length: (cm) 0.7 Width: (cm) 0.4 Depth: (cm) 0.1 Volume: (cm) 0.022 Character of Wound/Ulcer Post Debridement: Stable Severity of Tissue Post Debridement: Fat layer exposed Post Procedure Diagnosis Same as Pre-procedure Electronic Signature(s) Signed: 06/20/2023 4:04:36 PM By: Midge Aver MSN RN CNS WTA Entered By: Midge Aver on 06/20/2023 16:04:36 -------------------------------------------------------------------------------- HPI Details Patient Name: Date of Service: [name], HO [place] RD [name] 06/20/2023 2:00 PM Medical Record Number: [id number] Patient Account Number: [id number] Date of Birth/Sex: Treating RN: 1940/11/27 (82 y.o. Male) Midge Aver Primary Care Provider: Debbra Riding Other Clinician: Referring Provider: Treating Provider/Extender: Gala Lewandowsky Weeks in Treatment: 0 History of Present Illness Chronic/Inactive Conditions Condition 1: Notes from Vascular 05/21/23: Right below-the-knee popliteal artery to peroneal artery bypass with reverse saphenous vein graft. This was done on 04/11/2023. Noninvasive studies in our an ABI 0.61 on the right and 0.67 on the left. T oday arterial duplexes were done which shows biphasic/triphasic waveforms throughout the lower extremities down to the level of the tibial vessels. 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 This is suggestive that the patient's bypass is patent although it is not there is no noted occlusion on duplex 1. Atherosclerosis of native arteries of the extremities with ulceration (HCC) Based on noninvasive studies today appears that the patient would have possible marginal ability for wound healing. Concerned that some of the patient is usually a small vessel disease which may proved to have a little bit more difficult. Will have the patient return in several weeks to see if there has been improvement. They are advised that if the wound deteriorates or begins to show signs of starting to contact us for sooner follow-up. Electronic Signature(s) Signed: 06/20/2023 5:40:24 PM By: Allen Derry PA-C Entered By: Allen Derry on 06/20/2023 17:40:24 [name] ([id number]) [id number] Page 5 of 9 -------------------------------------------------------------------------------- Physician Orders Details Patient Name: Date of Service: [name], [name] 06/20/2023 2:00 PM Medical Record Number: [id number] Patient Account Number: [id number] Date of Birth/Sex: Treating RN: [birth year] (82 y.o. Male) Midge Aver Primary Care Provider: Debbra Riding Other Clinician: Referring Provider: Treating Provider/Extender: Florentina Jenny in Treatment: 0 Verbal / Phone

## 2023-06-25 ENCOUNTER — Ambulatory Visit: Payer: Medicare Other

## 2023-06-27 ENCOUNTER — Encounter: Payer: Medicare Other | Admitting: Physician Assistant

## 2023-06-27 ENCOUNTER — Ambulatory Visit: Payer: Medicare Other

## 2023-06-27 DIAGNOSIS — E11621 Type 2 diabetes mellitus with foot ulcer: Secondary | ICD-10-CM | POA: Diagnosis not present

## 2023-06-27 NOTE — Progress Notes (Addendum)
Discharge Instructions: Apply Silvercel Small 2x2 (in/in) as instructed Secondary Dressing: (BORDER) Zetuvit Plus SILICONE BORDER Dressing 5x5 (in/in) 3 x Per Week/30 Days Discharge Instructions: Please do not put silicone bordered dressings under wraps. Use non-bordered dressing only. 1. I would recommend that we have the patient continue to utilize a Dakin's moistened gauze for the open area. He is in agreement with the plan. 2. I am going to recommend as well that he should continue to monitor for any signs of infection or worsening. Based on what we are seeing I do believe that he is making headway towards closure which is good news. 3. I am also going to recommend that the patient should continue to utilize the silver alginate dressing or rather switch to silver alginate dressing for the other wounds on the foot and I think this may do better as they appear to be a little bit too moist. We will see patient back for reevaluation in 1 week here in the clinic. If anything worsens or changes patient will contact our office for additional recommendations. Electronic  Signature(s) Signed: 06/27/2023 9:50:18 AM By: Allen Derry PA-C Entered By: Allen Derry on 06/27/2023 09:50:18 -------------------------------------------------------------------------------- SuperBill Details Patient Name: Date of Service: [name], HO [place] RD D. 06/27/2023 Medical Record Number: [id number] Patient Account Number: [id number] Date of Birth/Sex: Treating RN: 14-Jan-1941 (82 y.o. 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 I10 Essential (primary) hypertension 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 Z79.01 Long term (current) use of anticoagulants 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, stage 3 unspecified Facility Procedures : CPT4 Code: 24401027 Description: 97597 - DEBRIDE WOUND 1ST 20 SQ CM OR < ICD-10 Diagnosis Description L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 Modifier: Quantity: 1 Physician Procedures : CPT4 Code Description Modifier [id number] 97597 - WC PHYS DEBR WO ANESTH 20 SQ CM LINWOOD, GULLIKSON D ([id number]) [id number] Pag ICD-10 Diagnosis Description L97.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 ulcer of other part of right foot with  fat layer exposed Quantity: 1 e 9 of 9 Electronic Signature(s) Signed: 06/27/2023 9:50:30 AM By: Allen Derry PA-C Entered By: Allen Derry on 06/27/2023 09:50:29  areas on the foot the original wound is actually at surgery site which is the amputation of the second toe. With that being said that area is somewhat deep to be perfectly honest. It does have a little bit concerned. The remaining areas all came from the boot he was wearing and areas that rubbed they have gotten rid of the boot this will not happen again as far as that is concerned. With that being said the patient has had vascular intervention and the note is above as documented in the vascular note. With that being said it does appear that the patient has what is termed "possible marginal ability for wound healing". I think this is basically something this can be quite difficult for [place] to completely close. I am not seeing them possible but again there is a long road ahead. Patient does have a history of diabetes mellitus type 2, hypertension, peripheral vascular disease, coronary artery disease, long-term use of anticoagulants, chronic kidney disease stage III, and as of right now there is no direct evidence of continued and ongoing osteomyelitis although I do question whether or not there may be an issue here. 06-27-2023 upon evaluation today patient appears to be doing okay in regard to his wounds. He is showing signs of some improvement which is good news although this appears to be a little bit too moist in regard to the wounds on his feet in general. I do believe that he would benefit from a switching dressings to silver alginate  away from the Xeroform at this point unfortunately. With that being said the original wound for which she was referred actually does appear to be doing okay at this point in my opinion. I am actually somewhat pleased with where we stand in that regard. I do not see any evidence of active infection at this time which is good. I think the Dakin's moistened gauze is helping. Electronic Signature(s) Signed: 06/27/2023 9:48:22 AM By: Allen Derry PA-C Entered By: Allen Derry on 06/27/2023 09:48:22 Peter Congo D ([id number]) [id number] Page 3 of 9 -------------------------------------------------------------------------------- Physical Exam Details Patient Name: Date of Service: [name], [name] 06/27/2023 8:30 A M Medical Record Number: [id number] Patient Account Number: [id number] Date of Birth/Sex: Treating RN: 05/14/1941 (82 y.o. [name] Primary Care Provider: Debbra Riding Other Clinician: Referring Provider: Treating Provider/Extender: Jasmine Pang in Treatment: 1 Constitutional Well-nourished and well-hydrated in no acute distress. Respiratory normal breathing without difficulty. Psychiatric this patient is able to make decisions and demonstrates good insight into disease process. Alert and Oriented x 3. pleasant and cooperative. Notes Patient's wounds currently showed signs of doing decently well as far as the original surgical site is concerned it is looking a little bit better cleaning up with the Dakin's moistened gauze packing which I think we should continue. With regard to the wounds on his foot it does appear to be a little bit too moist with the Xeroform going to switch over to silver alginate. Electronic Signature(s) Signed: 06/27/2023 9:50:11 AM By: Allen Derry PA-C Previous Signature: 06/27/2023 9:49:01 AM Version By: Allen Derry PA-C Entered By: Allen Derry on 06/27/2023  09:50:11 -------------------------------------------------------------------------------- Physician Orders Details Patient Name: Date of Service: [name], HO [place] RD D. 06/27/2023 8:30 A M Medical Record Number: [id number] Patient Account Number: [id number] Date of Birth/Sex: Treating RN: [birth year] (82 y.o. [name] Primary Care Provider: Debbra Riding Other Clinician: Referring Provider: Treating Provider/Extender: Jasmine Pang in Treatment: 1 Verbal /  EDRIC, FETTERMAN D ([id number]) [id number] Page 1 of 9 Visit Report for 06/27/2023 Chief Complaint Document Details Patient Name: Date of Service: [name], [name] 06/27/2023 8:30 A M Medical Record Number: [id number] Patient Account Number: [id number] Date of Birth/Sex: Treating RN: 23-Mar-1941 (82 y.o. [name] Primary Care Provider: Debbra Riding Other Clinician: Referring Provider: Treating Provider/Extender: Jasmine Pang in Treatment: 1 Information Obtained from: Patient Chief Complaint Right foot ulcers Electronic Signature(s) Signed: 06/27/2023 8:51:55 AM By: Allen Derry PA-C Entered By: Allen Derry on 06/27/2023 08:51:55 -------------------------------------------------------------------------------- Debridement Details Patient Name: Date of Service: [name], HO [place] RD D. 06/27/2023 8:30 A M Medical Record Number: [id number] Patient Account Number: [id number] Date of Birth/Sex: Treating RN: 07/13/1941 (82 y.o. [name] Primary Care Provider: Debbra Riding Other Clinician: Referring Provider: Treating Provider/Extender: Jasmine Pang in Treatment: 1 Debridement Performed for Assessment: Wound #2 Right,Plantar Foot Performed By: Physician Allen Derry, PA-C Debridement Type: Debridement Severity of Tissue Pre Debridement: Fat layer exposed Level of Consciousness (Pre-procedure): Awake and Alert Pre-procedure Verification/Time Out Yes - 09:01 Taken: Start Time: 09:01 Pain Control: Lidocaine 4% Topical Solution Percent of Wound Bed Debrided: 100% T Area Debrided (cm): otal 1.88 Tissue and other material debrided: Non-Viable, Callus, Skin: Dermis , Skin: Epidermis Level: Skin/Epidermis Debridement Description: Selective/Open Wound Instrument: Curette Bleeding: None Procedural Pain: 0 Post Procedural Pain: 0 Response to Treatment: Procedure was tolerated well SKYE, RODARTE D  ([id number]) [id number] Page 2 of 9 Level of Consciousness (Post- Awake and Alert procedure): Post Debridement Measurements of Total Wound Length: (cm) 2 Width: (cm) 1.2 Depth: (cm) 0.1 Volume: (cm) 0.188 Character of Wound/Ulcer Post Debridement: Stable Severity of Tissue Post Debridement: Fat layer exposed Post Procedure Diagnosis Same as Pre-procedure Electronic Signature(s) Signed: 06/27/2023 4:08:08 PM By: Midge Aver MSN RN CNS WTA Signed: 06/27/2023 4:17:03 PM By: Allen Derry PA-C Entered By: Midge Aver on 06/27/2023 09:03:55 -------------------------------------------------------------------------------- HPI Details Patient Name: Date of Service: [name], HO [place] RD D. 06/27/2023 8:30 A M Medical Record Number: [id number] Patient Account Number: [id number] Date of Birth/Sex: Treating RN: [birth year] (82 y.o. [name] Primary Care Provider: Debbra Riding Other Clinician: Referring Provider: Treating Provider/Extender: Jasmine Pang in Treatment: 1 History of Present Illness Chronic/Inactive Conditions Condition 1: Notes from Vascular 05/21/23: Right below-the-knee popliteal artery to peroneal artery bypass with reverse saphenous vein graft. This was done on 04/11/2023. Noninvasive studies in our an ABI 0.61 on the right and 0.67 on the left. T oday arterial duplexes were done which shows biphasic/triphasic waveforms throughout the lower extremities down to the level of the tibial vessels. 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 This is suggestive that the patient's bypass is patent although it is not there is no noted occlusion on duplex 1. Atherosclerosis of native arteries of the extremities with ulceration (HCC) Based on noninvasive studies today appears that the patient would have possible marginal  ability for wound healing. Concerned that some of the patient is usually a small vessel disease which may proved to have a little bit more difficult. Will have the patient return in several weeks to see if there has been improvement. They are advised that if the wound deteriorates or begins to show signs of starting to contact us for sooner follow-up. HPI Description: Upon evaluation today patient presents today for wounds over multiple locations which include several  EDRIC, FETTERMAN D ([id number]) [id number] Page 1 of 9 Visit Report for 06/27/2023 Chief Complaint Document Details Patient Name: Date of Service: [name], [name] 06/27/2023 8:30 A M Medical Record Number: [id number] Patient Account Number: [id number] Date of Birth/Sex: Treating RN: 23-Mar-1941 (82 y.o. [name] Primary Care Provider: Debbra Riding Other Clinician: Referring Provider: Treating Provider/Extender: Jasmine Pang in Treatment: 1 Information Obtained from: Patient Chief Complaint Right foot ulcers Electronic Signature(s) Signed: 06/27/2023 8:51:55 AM By: Allen Derry PA-C Entered By: Allen Derry on 06/27/2023 08:51:55 -------------------------------------------------------------------------------- Debridement Details Patient Name: Date of Service: [name], HO [place] RD D. 06/27/2023 8:30 A M Medical Record Number: [id number] Patient Account Number: [id number] Date of Birth/Sex: Treating RN: 07/13/1941 (82 y.o. [name] Primary Care Provider: Debbra Riding Other Clinician: Referring Provider: Treating Provider/Extender: Jasmine Pang in Treatment: 1 Debridement Performed for Assessment: Wound #2 Right,Plantar Foot Performed By: Physician Allen Derry, PA-C Debridement Type: Debridement Severity of Tissue Pre Debridement: Fat layer exposed Level of Consciousness (Pre-procedure): Awake and Alert Pre-procedure Verification/Time Out Yes - 09:01 Taken: Start Time: 09:01 Pain Control: Lidocaine 4% Topical Solution Percent of Wound Bed Debrided: 100% T Area Debrided (cm): otal 1.88 Tissue and other material debrided: Non-Viable, Callus, Skin: Dermis , Skin: Epidermis Level: Skin/Epidermis Debridement Description: Selective/Open Wound Instrument: Curette Bleeding: None Procedural Pain: 0 Post Procedural Pain: 0 Response to Treatment: Procedure was tolerated well SKYE, RODARTE D  ([id number]) [id number] Page 2 of 9 Level of Consciousness (Post- Awake and Alert procedure): Post Debridement Measurements of Total Wound Length: (cm) 2 Width: (cm) 1.2 Depth: (cm) 0.1 Volume: (cm) 0.188 Character of Wound/Ulcer Post Debridement: Stable Severity of Tissue Post Debridement: Fat layer exposed Post Procedure Diagnosis Same as Pre-procedure Electronic Signature(s) Signed: 06/27/2023 4:08:08 PM By: Midge Aver MSN RN CNS WTA Signed: 06/27/2023 4:17:03 PM By: Allen Derry PA-C Entered By: Midge Aver on 06/27/2023 09:03:55 -------------------------------------------------------------------------------- HPI Details Patient Name: Date of Service: [name], HO [place] RD D. 06/27/2023 8:30 A M Medical Record Number: [id number] Patient Account Number: [id number] Date of Birth/Sex: Treating RN: [birth year] (82 y.o. [name] Primary Care Provider: Debbra Riding Other Clinician: Referring Provider: Treating Provider/Extender: Jasmine Pang in Treatment: 1 History of Present Illness Chronic/Inactive Conditions Condition 1: Notes from Vascular 05/21/23: Right below-the-knee popliteal artery to peroneal artery bypass with reverse saphenous vein graft. This was done on 04/11/2023. Noninvasive studies in our an ABI 0.61 on the right and 0.67 on the left. T oday arterial duplexes were done which shows biphasic/triphasic waveforms throughout the lower extremities down to the level of the tibial vessels. 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 This is suggestive that the patient's bypass is patent although it is not there is no noted occlusion on duplex 1. Atherosclerosis of native arteries of the extremities with ulceration (HCC) Based on noninvasive studies today appears that the patient would have possible marginal  ability for wound healing. Concerned that some of the patient is usually a small vessel disease which may proved to have a little bit more difficult. Will have the patient return in several weeks to see if there has been improvement. They are advised that if the wound deteriorates or begins to show signs of starting to contact us for sooner follow-up. HPI Description: Upon evaluation today patient presents today for wounds over multiple locations which include several  Discharge Instructions: Apply Silvercel Small 2x2 (in/in) as instructed Secondary Dressing: (BORDER) Zetuvit Plus SILICONE BORDER Dressing 5x5 (in/in) 3 x Per Week/30 Days Discharge Instructions: Please do not put silicone bordered dressings under wraps. Use non-bordered dressing only. 1. I would recommend that we have the patient continue to utilize a Dakin's moistened gauze for the open area. He is in agreement with the plan. 2. I am going to recommend as well that he should continue to monitor for any signs of infection or worsening. Based on what we are seeing I do believe that he is making headway towards closure which is good news. 3. I am also going to recommend that the patient should continue to utilize the silver alginate dressing or rather switch to silver alginate dressing for the other wounds on the foot and I think this may do better as they appear to be a little bit too moist. We will see patient back for reevaluation in 1 week here in the clinic. If anything worsens or changes patient will contact our office for additional recommendations. Electronic  Signature(s) Signed: 06/27/2023 9:50:18 AM By: Allen Derry PA-C Entered By: Allen Derry on 06/27/2023 09:50:18 -------------------------------------------------------------------------------- SuperBill Details Patient Name: Date of Service: [name], HO [place] RD D. 06/27/2023 Medical Record Number: [id number] Patient Account Number: [id number] Date of Birth/Sex: Treating RN: 14-Jan-1941 (82 y.o. 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 I10 Essential (primary) hypertension 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 Z79.01 Long term (current) use of anticoagulants 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, stage 3 unspecified Facility Procedures : CPT4 Code: 24401027 Description: 97597 - DEBRIDE WOUND 1ST 20 SQ CM OR < ICD-10 Diagnosis Description L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 Modifier: Quantity: 1 Physician Procedures : CPT4 Code Description Modifier [id number] 97597 - WC PHYS DEBR WO ANESTH 20 SQ CM LINWOOD, GULLIKSON D ([id number]) [id number] Pag ICD-10 Diagnosis Description L97.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 ulcer of other part of right foot with  fat layer exposed Quantity: 1 e 9 of 9 Electronic Signature(s) Signed: 06/27/2023 9:50:30 AM By: Allen Derry PA-C Entered By: Allen Derry on 06/27/2023 09:50:29  Date MDM Diagnosis T81.31XA Disruption of external operation (surgical) wound, not elsewhere classified, 06/20/2023 No Yes initial encounter E11.621 Type 2 diabetes mellitus with foot ulcer 06/20/2023 No Yes L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 06/20/2023 No Yes I10 Essential (primary) hypertension 06/20/2023 No Yes 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 06/20/2023 No Yes I25.10 Atherosclerotic heart disease of native coronary artery without angina pectoris 06/20/2023 No Yes Z79.01 Long term (current) use of anticoagulants 06/20/2023 No Yes 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, stage 3 unspecified 06/20/2023 No Yes Inactive Problems Resolved Problems Electronic Signature(s) Signed: 06/27/2023 8:51:51 AM By: Allen Derry PA-C Entered By: Allen Derry on 06/27/2023 08:51:50 Peter Congo D ([id number]) [id number] Page 6 of 9 -------------------------------------------------------------------------------- Progress Note Details Patient Name: Date of Service: [name], [name] 06/27/2023 8:30 A  M Medical Record Number: [id number] Patient Account Number: [id number] Date of Birth/Sex: Treating RN: [birth year] (82 y.o. [name] Primary Care Provider: Debbra Riding Other Clinician: Referring Provider: Treating Provider/Extender: Jasmine Pang in Treatment: 1 Subjective Chief Complaint Information obtained from Patient Right foot ulcers History of Present Illness (HPI) Chronic/Inactive Condition: Notes from Vascular 05/21/23: Right below-the-knee popliteal artery to peroneal artery bypass with reverse saphenous vein graft. This was done on 04/11/2023. Noninvasive studies in our an ABI 0.61 on the right and 0.67 on the left. T oday arterial duplexes were done which shows biphasic/triphasic waveforms throughout the lower extremities down to the level of the tibial vessels. 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 This is suggestive that the patient's bypass is patent although it is not there is no noted occlusion on duplex 1. Atherosclerosis of native arteries of the extremities with ulceration (HCC) Based on noninvasive studies today appears that the patient would have possible marginal ability for wound healing. Concerned that some of the patient is usually a small vessel disease which may proved to have a little bit more difficult. Will have the patient return in several weeks to see if there has been improvement. They are advised that if the wound deteriorates or begins to show signs of starting to contact us for sooner follow-up. 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 With that being said that area is somewhat deep to be perfectly honest. It does have a little bit concerned. The  remaining areas all came from the boot he was wearing and areas that rubbed they have gotten rid of the boot this will not happen again as far as that is concerned. With that being said the patient has had vascular intervention and the note is above as documented in the vascular note. With that being said it does appear that the patient has what is termed "possible marginal ability for wound healing". I think this is basically something this can be quite difficult for [place] to completely close. I am not seeing them possible but again there is a long road ahead. Patient does have a history of diabetes mellitus type 2, hypertension, peripheral vascular disease, coronary artery disease, long-term use of anticoagulants, chronic kidney disease stage III, and as of right now there is no direct evidence of continued and ongoing osteomyelitis although I do question whether or not there may be an issue  Phone Orders: No Diagnosis Coding ICD-10 Coding Code Description T81.31XA Disruption of external operation (surgical) wound, not elsewhere classified, initial encounter E11.621 Type 2 diabetes mellitus with foot ulcer L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 I10 Essential (primary) hypertension 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 DAEGEN, BERROCAL D ([id number]) [id number] Page 4 of 9 Z79.01 Long term (current) use of anticoagulants 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 Bathing/ [employer] wounds with antibacterial soap and water. May shower; gently cleanse wound with antibacterial soap, rinse and pat dry prior to dressing wounds Wound Treatment Wound #1 - Amputation Site - Toe Wound Laterality: Right Cleanser: [employer] Ancillary Kit - 15 Day Supply (Generic) 1 x Per Day/30 Days Discharge Instructions: Use supplies as instructed; Kit contains: (15) Saline Bullets; (15) 3x3 Gauze; 15 pr Gloves Cleanser: Dakin 16 (oz) 0.25 1 x Per Day/30 Days Discharge Instructions: Use as directed. Prim Dressing: Gauze (Generic) 1 x Per Day/30 Days ary Discharge Instructions: As directed: dry, moistened with saline or moistened with Dakins Solution Secondary Dressing:  Conforming Guaze Roll-Large (Generic) 1 x Per Day/30 Days Discharge Instructions: Apply Conforming Stretch Guaze Bandage as directed Secured With: Medipore T - 87M Medipore H Soft Cloth Surgical T ape ape, 2x2 (in/yd) (Generic) 1 x Per Day/30 Days Wound #2 - Foot Wound Laterality: Plantar, Right Cleanser: Soap and Water 3 x Per Week/30 Days Discharge Instructions: Gently cleanse wound with antibacterial soap, rinse and pat dry prior to dressing wounds Prim Dressing: Silvercel Small 2x2 (in/in) 3 x Per Week/30 Days ary Discharge Instructions: Apply Silvercel Small 2x2 (in/in) as instructed Secondary Dressing: (BORDER) Zetuvit Plus SILICONE BORDER Dressing 5x5 (in/in) (Generic) 3 x Per Week/30 Days Discharge Instructions: Please do not put silicone bordered dressings under wraps. Use non-bordered dressing only. Wound #3 - Foot Wound Laterality: Right, Lateral Cleanser: Soap and Water 3 x Per Week/30 Days Discharge Instructions: Gently cleanse wound with antibacterial soap, rinse and pat dry prior to dressing wounds Prim Dressing: Silvercel Small 2x2 (in/in) 3 x Per Week/30 Days ary Discharge Instructions: Apply Silvercel Small 2x2 (in/in) as instructed Secondary Dressing: (BORDER) Zetuvit Plus SILICONE BORDER Dressing 5x5 (in/in) (Generic) 3 x Per Week/30 Days Discharge Instructions: Please do not put silicone bordered dressings under wraps. Use non-bordered dressing only. Wound #4 - Foot Wound Laterality: Right, Medial Cleanser: Soap and Water 3 x Per Week/30 Days Discharge Instructions: Gently cleanse wound with antibacterial soap, rinse and pat dry prior to dressing wounds Prim Dressing: Silvercel Small 2x2 (in/in) 3 x Per Week/30 Days ary Discharge Instructions: Apply Silvercel Small 2x2 (in/in) as instructed Secondary Dressing: (BORDER) Zetuvit Plus SILICONE BORDER Dressing 5x5 (in/in) 3 x Per Week/30 Days Discharge Instructions: Please do not put silicone bordered dressings under wraps.  Use non-bordered dressing only. Electronic Signature(s) Signed: 06/27/2023 4:08:08 PM By: Midge Aver MSN RN CNS WTA Signed: 06/27/2023 4:17:03 PM By: Allen Derry PA-C Entered By: Midge Aver on 06/27/2023 09:06:35 Peter Congo D ([id number]) [id number] Page 5 of 9 -------------------------------------------------------------------------------- Problem List Details Patient Name: Date of Service: [name], [name] 06/27/2023 8:30 A M Medical Record Number: [id number] Patient Account Number: [id number] Date of Birth/Sex: Treating RN: [birth year] (82 y.o. [name] Primary Care Provider: Debbra Riding Other Clinician: Referring Provider: Treating Provider/Extender: Jasmine Pang in Treatment: 1 Active Problems ICD-10 Encounter Code Description Active

## 2023-06-27 NOTE — Progress Notes (Signed)
[name], [name] ([id number]) [id number] Page 1 of 13 Visit Report for 06/27/2023 Arrival Information Details Patient Name: Date of Service: [name], [name] 06/27/2023 8:30 A M Medical Record Number: [id number] Patient Account Number: [id number] Date of Birth/Sex: Treating RN: 07-11-1941 (82 y.o. [name] Primary Care Shanitra Phillippi: Debbra Riding Other Clinician: Referring Nashalie Sallis: Treating Cherilyn Sautter/Extender: Jasmine Pang in Treatment: 1 Visit Information History Since Last Visit Added or deleted any medications: No Patient Arrived: [name] Any new allergies or adverse reactions: No Arrival Time: 08:34 Has Dressing in Place as Prescribed: Yes Accompanied By: daughter in law Pain Present Now: No Transfer Assistance: None Patient Identification Verified: Yes Secondary Verification Process Completed: Yes Patient Requires Transmission-Based Precautions: No Patient Has Alerts: Yes Patient Alerts: Patient on Blood Thinner diabetes type 2 Plavix Electronic Signature(s) Signed: 06/27/2023 4:08:08 PM By: Midge Aver MSN RN CNS WTA Entered By: Midge Aver on 06/27/2023 08:46:57 -------------------------------------------------------------------------------- Clinic Level of Care Assessment Details Patient Name: Date of Service: [name], [name] 06/27/2023 8:30 A M Medical Record Number: [id number] Patient Account Number: [id number] Date of Birth/Sex: Treating RN: 03-06-1941 (82 y.o. [name] Primary Care Mileena Rothenberger: Debbra Riding Other Clinician: Referring Illyanna Petillo: Treating Deb Loudin/Extender: Jasmine Pang in Treatment: 1 Clinic Level of Care Assessment Items TOOL 1 Quantity Score []  - 0 Use when EandM and Procedure is performed on INITIAL visit ASSESSMENTS - Nursing Assessment / Reassessment []  - 0 General Physical Exam (combine w/ comprehensive assessment (listed just below) when performed  on new pt. evals) []  - 0 Comprehensive Assessment (HX, ROS, Risk Assessments, Wounds Hx, etc.) ASSESSMENTS - Wound and Skin Assessment / Reassessment []  - 0 Dermatologic / Skin Assessment (not related to wound area) JI, FELDNER [name] ([id number]) [id number] Page 2 of 13 ASSESSMENTS - Ostomy and/or Continence Assessment and Care []  - 0 Incontinence Assessment and Management []  - 0 Ostomy Care Assessment and Management (repouching, etc.) PROCESS - Coordination of Care []  - 0 Simple Patient / Family Education for ongoing care []  - 0 Complex (extensive) Patient / Family Education for ongoing care []  - 0 Staff obtains Chiropractor, Records, T Results / Process Orders est []  - 0 Staff telephones HHA, Nursing Homes / Clarify orders / etc []  - 0 Routine Transfer to another Facility (non-emergent condition) []  - 0 Routine Hospital Admission (non-emergent condition) []  - 0 New Admissions / Manufacturing engineer / Ordering NPWT Apligraf, etc. , []  - 0 Emergency Hospital Admission (emergent condition) PROCESS - Special Needs []  - 0 Pediatric / Minor Patient Management []  - 0 Isolation Patient Management []  - 0 Hearing / Language / Visual special needs []  - 0 Assessment of Community assistance (transportation, [name]/C planning, etc.) []  - 0 Additional assistance / Altered mentation []  - 0 Support Surface(s) Assessment (bed, cushion, seat, etc.) INTERVENTIONS - Miscellaneous []  - 0 External ear exam []  - 0 Patient Transfer (multiple staff / Nurse, adult / Similar devices) []  - 0 Simple Staple / Suture removal (25 or less) []  - 0 Complex Staple / Suture removal (26 or more) []  - 0 Hypo/Hyperglycemic Management (do not check if billed separately) []  - 0 Ankle / Brachial Index (ABI) - do not check if billed separately Has the patient been seen at the hospital within the last three years: Yes Total Score: 0 Level Of Care: ____ Electronic  Signature(s) Signed: 06/27/2023 4:08:08 PM By: Midge Aver MSN RN CNS WTA Entered By: Midge Aver on 06/27/2023 09:20:21 --------------------------------------------------------------------------------  N/A Measurements L x W x [name] (cm) 0.118 N/A N/A A (cm) : rea 0.012 N/A N/A Volume (cm) : 46.40% N/A N/A % Reduction in A rea: 45.50% N/A N/A % Reduction in Volume: Grade 2 N/A N/A Classification: Medium N/A N/A Exudate A mount: Serosanguineous N/A N/A Exudate Type: red, brown N/A N/A Exudate Color: Small (1-33%) N/A N/A Granulation A mount: Pale N/A N/A Granulation Quality: None Present (0%) N/A N/A Necrotic A mount: Fat Layer (Subcutaneous Tissue): Yes N/A N/A Exposed Structures: Fascia: No Tendon: No Muscle: No Joint: No Bone: No Small (1-33%) N/A N/A Epithelialization: Treatment Notes MAUREEN, DUESING ([id number]) [id number] Page 5 of 13 Electronic Signature(s) Signed: 06/27/2023 4:08:08 PM By: Midge Aver MSN RN CNS WTA Entered By: Midge Aver on 06/27/2023 09:00:34 -------------------------------------------------------------------------------- Multi-Disciplinary Care Plan Details Patient Name: Date of Service: [name], [name] 06/27/2023 8:30 A M Medical Record Number: [id number] Patient Account  Number: [id number] Date of Birth/Sex: Treating RN: April 18, 1941 (82 y.o. [name] Primary Care Janea Schwenn: Debbra Riding Other Clinician: Referring Clarissia Mckeen: Treating Marializ Ferrebee/Extender: Jasmine Pang in Treatment: 1 Active Inactive Wound/Skin Impairment Nursing Diagnoses: Impaired tissue integrity Knowledge deficit related to ulceration/compromised skin integrity Goals: Patient/caregiver will verbalize understanding of skin care regimen Date Initiated: 06/20/2023 Target Resolution Date: 07/21/2023 Goal Status: Active Ulcer/skin breakdown will have a volume reduction of 30% by week 4 Date Initiated: 06/20/2023 Target Resolution Date: 07/21/2023 Goal Status: Active Ulcer/skin breakdown will have a volume reduction of 50% by week 8 Date Initiated: 06/20/2023 Target Resolution Date: 08/20/2023 Goal Status: Active Ulcer/skin breakdown will have a volume reduction of 80% by week 12 Date Initiated: 06/20/2023 Target Resolution Date: 09/20/2023 Goal Status: Active Ulcer/skin breakdown will heal within 14 weeks Date Initiated: 06/20/2023 Target Resolution Date: 10/04/2023 Goal Status: Active Interventions: Assess patient/caregiver ability to obtain necessary supplies Assess patient/caregiver ability to perform ulcer/skin care regimen upon admission and as needed Assess ulceration(s) every visit Provide education on ulcer and skin care Treatment Activities: Referred to DME Zayra Devito for dressing supplies : 06/20/2023 Skin care regimen initiated : 06/20/2023 Topical wound management initiated : 06/20/2023 Notes: Electronic Signature(s) Signed: 06/27/2023 4:08:08 PM By: Midge Aver MSN RN CNS WTA Entered By: Midge Aver on 06/27/2023 09:20:44 Peter Congo [name] ([id number]) [id number] Page 6 of 13 -------------------------------------------------------------------------------- Pain Assessment Details Patient Name: Date of Service: [name], [name] RD [name] 06/27/2023 8:30 A M Medical Record Number: [id number] Patient Account Number: [id number] Date of Birth/Sex: Treating RN: 07-16-1941 (82 y.o. [name] Primary Care Jontrell Bushong: Debbra Riding Other Clinician: Referring Thandiwe Siragusa: Treating Vandy Fong/Extender: Jasmine Pang in Treatment: 1 Active Problems Location of Pain Severity and Description of Pain Patient Has Paino Yes Site Locations Rate the pain. Current Pain Level: 7 Pain Management and Medication Current Pain Management: Electronic Signature(s) Signed: 06/27/2023 4:08:08 PM By: Midge Aver MSN RN CNS WTA Entered By: Midge Aver on 06/27/2023 08:47:28 -------------------------------------------------------------------------------- Patient/Caregiver Education Details Patient Name: Date of Service: [name], [name] RD [name] 10/10/2024andnbsp8:30 A M Medical Record Number: [id number] Patient Account Number: [id number] Date of Birth/Gender: Treating RN: 06-12-1941 (82 y.o. [name] Primary Care Physician: Debbra Riding Other Clinician: Referring Physician: Treating Physician/Extender: Jasmine Pang in Treatment: 1 [place] [name] ([id number]) [id number] Page 7 of 13 Education Assessment Education Provided To: Patient Education Topics Provided Wound Debridement: Handouts: Wound Debridement Methods: Explain/Verbal Responses: State content correctly Wound/Skin Impairment: Handouts: Caring for Your Ulcer Methods: Explain/Verbal Responses: State content  correctly Electronic Signature(s) Signed: 06/27/2023 4:08:08 PM By: Midge Aver MSN RN CNS WTA Entered By: Midge Aver on 06/27/2023 09:20:59 -------------------------------------------------------------------------------- Wound Assessment Details Patient Name: Date of Service: [name], [place] RD [name] 06/27/2023 8:30 A M Medical Record Number: [id number] Patient Account Number:  [id number] Date of Birth/Sex: Treating RN: 1941/02/10 (82 y.o. 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 Wound Open Wounding Event: Surgical Injury Status: Date Acquired: 04/16/2023 Comorbid Congestive Heart Failure, Hypertension, Peripheral Venous Weeks Of Treatment: 1 History: Disease, Type II Diabetes, Neuropathy Clustered Wound: No Photos Wound Measurements Length: (cm) 1.8 Width: (cm) 0.8 Depth: (cm) 1.2 Area: (cm) 1.131 Volume: (cm) 1.357 DARALD, UZZLE ([id number]) Wound Description Classification: Full Thickness Without Exposed Support Structures Exudate Amount: Medium Exudate Type: Serosanguineous Exudate Color: red, brown Foul Odor After Cleansing: No Slough/Fibrino No % Reduction in Area: 4% % Reduction in Volume: 11.4% Epithelialization: None [id number] Page 8 of 13 Wound Bed Granulation Amount: Medium (34-66%) Exposed Structure Granulation Quality: Pink Fascia Exposed: No Fat Layer (Subcutaneous Tissue) Exposed: Yes Tendon Exposed: No Muscle Exposed: No Joint Exposed: No Bone Exposed: No Treatment Notes Wound #1 (Amputation Site - Toe) Wound Laterality: Right Cleanser Byram Ancillary Kit - 15 Day Supply Discharge Instruction: Use supplies as instructed; Kit contains: (15) Saline Bullets; (15) 3x3 Gauze; 15 pr Gloves Dakin 16 (oz) 0.25 Discharge Instruction: Use as directed. Peri-Wound Care Topical Primary Dressing Gauze Discharge Instruction: As directed: dry, moistened with saline or moistened with Dakins Solution Secondary Dressing Conforming Guaze Roll-Large Discharge Instruction: Apply Conforming Stretch Guaze Bandage as directed Secured With Medipore T - 56M Medipore H Soft Cloth  Surgical T ape ape, 2x2 (in/yd) Compression Wrap Compression Stockings Facilities manager) Signed: 06/27/2023 4:08:08 PM By: Midge Aver MSN RN CNS WTA Entered By: Midge Aver on 06/27/2023 08:49:18 -------------------------------------------------------------------------------- Wound Assessment Details Patient Name: Date of Service: [name], [place] RD [name] 06/27/2023 8:30 A M Medical Record Number: [id number] Patient Account Number: [id number] Date of Birth/Sex: Treating RN: 24-Dec-1940 (82 y.o. 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, RATHBONE ([id number]) [id number] Page 9 of 13 Etiology: Wound Location: Right, Plantar Foot Wound Open Wounding Event: Footwear Injury Status: Date Acquired: 06/16/2023 Comorbid Congestive Heart Failure, Hypertension, Peripheral Venous Weeks Of Treatment: 1 History: Disease, Type II Diabetes, Neuropathy Clustered Wound: No Photos Wound Measurements Length: (cm) 2 Width: (cm) 1.2 Depth: (cm) 0.1 Area: (cm) 1.885 Volume: (cm) 0.188 % Reduction in Area: -389.6% % Reduction in Volume: -394.7% Epithelialization: Small (1-33%) Wound Description Classification: Grade 1 Exudate Amount: Medium Exudate Type: Serosanguineous Exudate Color: red, brown Foul Odor After Cleansing: No Slough/Fibrino No Wound Bed Granulation Amount: Medium (34-66%) Exposed Structure Granulation Quality: Red, Pink Fascia Exposed: No Necrotic Amount: None Present (0%) Fat Layer (Subcutaneous Tissue) Exposed: No Tendon Exposed: No Muscle Exposed: No Joint Exposed: No Bone Exposed: No Treatment Notes Wound #2 (Foot) Wound Laterality: Plantar, Right Cleanser Soap and Water Discharge Instruction:  Gently cleanse wound with antibacterial soap, rinse and pat dry prior to dressing wounds Peri-Wound Care Topical Primary Dressing Silvercel Small 2x2 (in/in) Discharge Instruction: Apply Silvercel Small 2x2 (in/in) as instructed Secondary Dressing (BORDER) Zetuvit  N/A Measurements L x W x [name] (cm) 0.118 N/A N/A A (cm) : rea 0.012 N/A N/A Volume (cm) : 46.40% N/A N/A % Reduction in A rea: 45.50% N/A N/A % Reduction in Volume: Grade 2 N/A N/A Classification: Medium N/A N/A Exudate A mount: Serosanguineous N/A N/A Exudate Type: red, brown N/A N/A Exudate Color: Small (1-33%) N/A N/A Granulation A mount: Pale N/A N/A Granulation Quality: None Present (0%) N/A N/A Necrotic A mount: Fat Layer (Subcutaneous Tissue): Yes N/A N/A Exposed Structures: Fascia: No Tendon: No Muscle: No Joint: No Bone: No Small (1-33%) N/A N/A Epithelialization: Treatment Notes MAUREEN, DUESING ([id number]) [id number] Page 5 of 13 Electronic Signature(s) Signed: 06/27/2023 4:08:08 PM By: Midge Aver MSN RN CNS WTA Entered By: Midge Aver on 06/27/2023 09:00:34 -------------------------------------------------------------------------------- Multi-Disciplinary Care Plan Details Patient Name: Date of Service: [name], [name] 06/27/2023 8:30 A M Medical Record Number: [id number] Patient Account  Number: [id number] Date of Birth/Sex: Treating RN: April 18, 1941 (82 y.o. [name] Primary Care Janea Schwenn: Debbra Riding Other Clinician: Referring Clarissia Mckeen: Treating Marializ Ferrebee/Extender: Jasmine Pang in Treatment: 1 Active Inactive Wound/Skin Impairment Nursing Diagnoses: Impaired tissue integrity Knowledge deficit related to ulceration/compromised skin integrity Goals: Patient/caregiver will verbalize understanding of skin care regimen Date Initiated: 06/20/2023 Target Resolution Date: 07/21/2023 Goal Status: Active Ulcer/skin breakdown will have a volume reduction of 30% by week 4 Date Initiated: 06/20/2023 Target Resolution Date: 07/21/2023 Goal Status: Active Ulcer/skin breakdown will have a volume reduction of 50% by week 8 Date Initiated: 06/20/2023 Target Resolution Date: 08/20/2023 Goal Status: Active Ulcer/skin breakdown will have a volume reduction of 80% by week 12 Date Initiated: 06/20/2023 Target Resolution Date: 09/20/2023 Goal Status: Active Ulcer/skin breakdown will heal within 14 weeks Date Initiated: 06/20/2023 Target Resolution Date: 10/04/2023 Goal Status: Active Interventions: Assess patient/caregiver ability to obtain necessary supplies Assess patient/caregiver ability to perform ulcer/skin care regimen upon admission and as needed Assess ulceration(s) every visit Provide education on ulcer and skin care Treatment Activities: Referred to DME Zayra Devito for dressing supplies : 06/20/2023 Skin care regimen initiated : 06/20/2023 Topical wound management initiated : 06/20/2023 Notes: Electronic Signature(s) Signed: 06/27/2023 4:08:08 PM By: Midge Aver MSN RN CNS WTA Entered By: Midge Aver on 06/27/2023 09:20:44 Peter [place] [name] ([id number]) [id number] Page 6 of 13 -------------------------------------------------------------------------------- Pain Assessment Details Patient Name: Date of Service: [name], [name] RD [name] 06/27/2023 8:30 A M Medical Record Number: [id number] Patient Account Number: [id number] Date of Birth/Sex: Treating RN: 07-16-1941 (82 y.o. [name] Primary Care Jontrell Bushong: Debbra Riding Other Clinician: Referring Thandiwe Siragusa: Treating Vandy Fong/Extender: Jasmine Pang in Treatment: 1 Active Problems Location of Pain Severity and Description of Pain Patient Has Paino Yes Site Locations Rate the pain. Current Pain Level: 7 Pain Management and Medication Current Pain Management: Electronic Signature(s) Signed: 06/27/2023 4:08:08 PM By: Midge Aver MSN RN CNS WTA Entered By: Midge Aver on 06/27/2023 08:47:28 -------------------------------------------------------------------------------- Patient/Caregiver Education Details Patient Name: Date of Service: [name], [name] RD [name] 10/10/2024andnbsp8:30 A M Medical Record Number: [id number] Patient Account Number: [id number] Date of Birth/Gender: Treating RN: 06-12-1941 (82 y.o. [name] Primary Care Physician: Debbra Riding Other Clinician: Referring Physician: Treating Physician/Extender: Jasmine Pang in Treatment: 1 [place] [name] ([id number]) [id number] Page 7 of 13 Education Assessment Education Provided To: Patient Education Topics Provided Wound Debridement: Handouts: Wound Debridement Methods: Explain/Verbal Responses: State content correctly Wound/Skin Impairment: Handouts: Caring for Your Ulcer Methods: Explain/Verbal Responses: State content  correctly Electronic Signature(s) Signed: 06/27/2023 4:08:08 PM By: Midge Aver MSN RN CNS WTA Entered By: Midge Aver on 06/27/2023 09:20:59 -------------------------------------------------------------------------------- Wound Assessment Details Patient Name: Date of Service: [name], [place] RD [name] 06/27/2023 8:30 A M Medical Record Number: [id number] Patient Account Number:  [id number] Date of Birth/Sex: Treating RN: 1941/02/10 (82 y.o. 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 Wound Open Wounding Event: Surgical Injury Status: Date Acquired: 04/16/2023 Comorbid Congestive Heart Failure, Hypertension, Peripheral Venous Weeks Of Treatment: 1 History: Disease, Type II Diabetes, Neuropathy Clustered Wound: No Photos Wound Measurements Length: (cm) 1.8 Width: (cm) 0.8 Depth: (cm) 1.2 Area: (cm) 1.131 Volume: (cm) 1.357 DARALD, UZZLE ([id number]) Wound Description Classification: Full Thickness Without Exposed Support Structures Exudate Amount: Medium Exudate Type: Serosanguineous Exudate Color: red, brown Foul Odor After Cleansing: No Slough/Fibrino No % Reduction in Area: 4% % Reduction in Volume: 11.4% Epithelialization: None [id number] Page 8 of 13 Wound Bed Granulation Amount: Medium (34-66%) Exposed Structure Granulation Quality: Pink Fascia Exposed: No Fat Layer (Subcutaneous Tissue) Exposed: Yes Tendon Exposed: No Muscle Exposed: No Joint Exposed: No Bone Exposed: No Treatment Notes Wound #1 (Amputation Site - Toe) Wound Laterality: Right Cleanser Byram Ancillary Kit - 15 Day Supply Discharge Instruction: Use supplies as instructed; Kit contains: (15) Saline Bullets; (15) 3x3 Gauze; 15 pr Gloves Dakin 16 (oz) 0.25 Discharge Instruction: Use as directed. Peri-Wound Care Topical Primary Dressing Gauze Discharge Instruction: As directed: dry, moistened with saline or moistened with Dakins Solution Secondary Dressing Conforming Guaze Roll-Large Discharge Instruction: Apply Conforming Stretch Guaze Bandage as directed Secured With Medipore T - 56M Medipore H Soft Cloth  Surgical T ape ape, 2x2 (in/yd) Compression Wrap Compression Stockings Facilities manager) Signed: 06/27/2023 4:08:08 PM By: Midge Aver MSN RN CNS WTA Entered By: Midge Aver on 06/27/2023 08:49:18 -------------------------------------------------------------------------------- Wound Assessment Details Patient Name: Date of Service: [name], [place] RD [name] 06/27/2023 8:30 A M Medical Record Number: [id number] Patient Account Number: [id number] Date of Birth/Sex: Treating RN: 24-Dec-1940 (82 y.o. 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, RATHBONE ([id number]) [phone] Page 9 of 13 Etiology: Wound Location: Right, Plantar Foot Wound Open Wounding Event: Footwear Injury Status: Date Acquired: 06/16/2023 Comorbid Congestive Heart Failure, Hypertension, Peripheral Venous Weeks Of Treatment: 1 History: Disease, Type II Diabetes, Neuropathy Clustered Wound: No Photos Wound Measurements Length: (cm) 2 Width: (cm) 1.2 Depth: (cm) 0.1 Area: (cm) 1.885 Volume: (cm) 0.188 % Reduction in Area: -389.6% % Reduction in Volume: -394.7% Epithelialization: Small (1-33%) Wound Description Classification: Grade 1 Exudate Amount: Medium Exudate Type: Serosanguineous Exudate Color: red, brown Foul Odor After Cleansing: No Slough/Fibrino No Wound Bed Granulation Amount: Medium (34-66%) Exposed Structure Granulation Quality: Red, Pink Fascia Exposed: No Necrotic Amount: None Present (0%) Fat Layer (Subcutaneous Tissue) Exposed: No Tendon Exposed: No Muscle Exposed: No Joint Exposed: No Bone Exposed: No Treatment Notes Wound #2 (Foot) Wound Laterality: Plantar, Right Cleanser Soap and Water Discharge Instruction:  Gently cleanse wound with antibacterial soap, rinse and pat dry prior to dressing wounds Peri-Wound Care Topical Primary Dressing Silvercel Small 2x2 (in/in) Discharge Instruction: Apply Silvercel Small 2x2 (in/in) as instructed Secondary Dressing (BORDER) Zetuvit  [name], [name] ([id number]) [id number] Page 1 of 13 Visit Report for 06/27/2023 Arrival Information Details Patient Name: Date of Service: [name], [name] 06/27/2023 8:30 A M Medical Record Number: [id number] Patient Account Number: [id number] Date of Birth/Sex: Treating RN: 07-11-1941 (82 y.o. [name] Primary Care Shanitra Phillippi: Debbra Riding Other Clinician: Referring Nashalie Sallis: Treating Cherilyn Sautter/Extender: Jasmine Pang in Treatment: 1 Visit Information History Since Last Visit Added or deleted any medications: No Patient Arrived: [name] Any new allergies or adverse reactions: No Arrival Time: 08:34 Has Dressing in Place as Prescribed: Yes Accompanied By: daughter in law Pain Present Now: No Transfer Assistance: None Patient Identification Verified: Yes Secondary Verification Process Completed: Yes Patient Requires Transmission-Based Precautions: No Patient Has Alerts: Yes Patient Alerts: Patient on Blood Thinner diabetes type 2 Plavix Electronic Signature(s) Signed: 06/27/2023 4:08:08 PM By: Midge Aver MSN RN CNS WTA Entered By: Midge Aver on 06/27/2023 08:46:57 -------------------------------------------------------------------------------- Clinic Level of Care Assessment Details Patient Name: Date of Service: [name], [name] 06/27/2023 8:30 A M Medical Record Number: [id number] Patient Account Number: [id number] Date of Birth/Sex: Treating RN: 03-06-1941 (82 y.o. [name] Primary Care Mileena Rothenberger: Debbra Riding Other Clinician: Referring Illyanna Petillo: Treating Deb Loudin/Extender: Jasmine Pang in Treatment: 1 Clinic Level of Care Assessment Items TOOL 1 Quantity Score []  - 0 Use when EandM and Procedure is performed on INITIAL visit ASSESSMENTS - Nursing Assessment / Reassessment []  - 0 General Physical Exam (combine w/ comprehensive assessment (listed just below) when performed  on new pt. evals) []  - 0 Comprehensive Assessment (HX, ROS, Risk Assessments, Wounds Hx, etc.) ASSESSMENTS - Wound and Skin Assessment / Reassessment []  - 0 Dermatologic / Skin Assessment (not related to wound area) JI, FELDNER [name] ([id number]) [id number] Page 2 of 13 ASSESSMENTS - Ostomy and/or Continence Assessment and Care []  - 0 Incontinence Assessment and Management []  - 0 Ostomy Care Assessment and Management (repouching, etc.) PROCESS - Coordination of Care []  - 0 Simple Patient / Family Education for ongoing care []  - 0 Complex (extensive) Patient / Family Education for ongoing care []  - 0 Staff obtains Chiropractor, Records, T Results / Process Orders est []  - 0 Staff telephones HHA, Nursing Homes / Clarify orders / etc []  - 0 Routine Transfer to another Facility (non-emergent condition) []  - 0 Routine Hospital Admission (non-emergent condition) []  - 0 New Admissions / Manufacturing engineer / Ordering NPWT Apligraf, etc. , []  - 0 Emergency Hospital Admission (emergent condition) PROCESS - Special Needs []  - 0 Pediatric / Minor Patient Management []  - 0 Isolation Patient Management []  - 0 Hearing / Language / Visual special needs []  - 0 Assessment of Community assistance (transportation, [name]/C planning, etc.) []  - 0 Additional assistance / Altered mentation []  - 0 Support Surface(s) Assessment (bed, cushion, seat, etc.) INTERVENTIONS - Miscellaneous []  - 0 External ear exam []  - 0 Patient Transfer (multiple staff / Nurse, adult / Similar devices) []  - 0 Simple Staple / Suture removal (25 or less) []  - 0 Complex Staple / Suture removal (26 or more) []  - 0 Hypo/Hyperglycemic Management (do not check if billed separately) []  - 0 Ankle / Brachial Index (ABI) - do not check if billed separately Has the patient been seen at the hospital within the last three years: Yes Total Score: 0 Level Of Care: ____ Electronic  Signature(s) Signed: 06/27/2023 4:08:08 PM By: Midge Aver MSN RN CNS WTA Entered By: Midge Aver on 06/27/2023 09:20:21 --------------------------------------------------------------------------------

## 2023-07-02 ENCOUNTER — Ambulatory Visit: Payer: Medicare Other

## 2023-07-04 ENCOUNTER — Ambulatory Visit: Payer: Medicare Other

## 2023-07-04 ENCOUNTER — Encounter: Payer: Medicare Other | Admitting: Physician Assistant

## 2023-07-04 DIAGNOSIS — E11621 Type 2 diabetes mellitus with foot ulcer: Secondary | ICD-10-CM | POA: Diagnosis not present

## 2023-07-04 NOTE — Progress Notes (Addendum)
[name], [name] ([id number]) [id number] Page 1 of 15 Visit Report for 07/04/2023 Arrival Information Details Patient Name: Date of Service: [name], [name] 07/04/2023 1:15 PM Medical Record Number: [id number] Patient Account Number: [id number] Date of Birth/Sex: Treating RN: 11-Sep-1941 (82 y.o. [name] Primary Care Brayon Bielefeld: Debbra Riding Other Clinician: Referring Lasundra Hascall: Treating Latangela Mccomas/Extender: Jasmine Pang in Treatment: 2 Visit Information History Since Last Visit Added or deleted any medications: No Patient Arrived: Dan Humphreys Any new allergies or adverse reactions: No Arrival Time: 13:11 Has Dressing in Place as Prescribed: Yes Accompanied By: daughter in law Pain Present Now: No Transfer Assistance: Manual Patient Identification Verified: Yes Secondary Verification Process Completed: Yes Patient Requires Transmission-Based Precautions: No Patient Has Alerts: Yes Patient Alerts: Patient on Blood Thinner diabetes type 2 Plavix Electronic Signature(s) Signed: 07/04/2023 4:59:36 PM By: Midge Aver MSN RN CNS WTA Entered By: Midge Aver on 07/04/2023 16:59:35 -------------------------------------------------------------------------------- Clinic Level of Care Assessment Details Patient Name: Date of Service: [name], [name] 07/04/2023 1:15 PM Medical Record Number: [id number] Patient Account Number: [id number] Date of Birth/Sex: Treating RN: Jun 14, 1941 (82 y.o. [name] Primary Care Jebediah Macrae: Debbra Riding Other Clinician: Referring Tajae Rybicki: Treating Arohi Salvatierra/Extender: Jasmine Pang in Treatment: 2 Clinic Level of Care Assessment Items TOOL 4 Quantity Score X- 1 0 Use when only an EandM is performed on FOLLOW-UP visit ASSESSMENTS - Nursing Assessment / Reassessment X- 1 10 Reassessment of Co-morbidities (includes updates in patient status) X- 1 5 Reassessment of  Adherence to Treatment Plan ASSESSMENTS - Wound and Skin A ssessment / Reassessment []  - 0 Simple Wound Assessment / Reassessment - one wound AMERICUS, KORBY D ([id number]) [id number] Page 2 of 15 X- 3 5 Complex Wound Assessment / Reassessment - multiple wounds []  - 0 Dermatologic / Skin Assessment (not related to wound area) ASSESSMENTS - Focused Assessment []  - 0 Circumferential Edema Measurements - multi extremities []  - 0 Nutritional Assessment / Counseling / Intervention []  - 0 Lower Extremity Assessment (monofilament, tuning fork, pulses) []  - 0 Peripheral Arterial Disease Assessment (using hand held doppler) ASSESSMENTS - Ostomy and/or Continence Assessment and Care []  - 0 Incontinence Assessment and Management []  - 0 Ostomy Care Assessment and Management (repouching, etc.) PROCESS - Coordination of Care []  - 0 Simple Patient / Family Education for ongoing care X- 1 20 Complex (extensive) Patient / Family Education for ongoing care X- 1 10 Staff obtains Chiropractor, Records, T Results / Process Orders est []  - 0 Staff telephones HHA, Nursing Homes / Clarify orders / etc []  - 0 Routine Transfer to another Facility (non-emergent condition) []  - 0 Routine Hospital Admission (non-emergent condition) []  - 0 New Admissions / Manufacturing engineer / Ordering NPWT Apligraf, etc. , []  - 0 Emergency Hospital Admission (emergent condition) []  - 0 Simple Discharge Coordination X- 1 15 Complex (extensive) Discharge Coordination PROCESS - Special Needs []  - 0 Pediatric / Minor Patient Management []  - 0 Isolation Patient Management []  - 0 Hearing / Language / Visual special needs []  - 0 Assessment of Community assistance (transportation, D/C planning, etc.) []  - 0 Additional assistance / Altered mentation []  - 0 Support Surface(s) Assessment (bed, cushion, seat, etc.) INTERVENTIONS - Wound Cleansing / Measurement []  - 0 Simple Wound  Cleansing - one wound X- 3 5 Complex Wound Cleansing - multiple wounds X- 1 5 Wound Imaging (photographs - any number of wounds) []  - 0 Wound Tracing (instead of photographs) []  - 0  [name], [name] ([id number]) [id number] Page 1 of 15 Visit Report for 07/04/2023 Arrival Information Details Patient Name: Date of Service: [name], [name] 07/04/2023 1:15 PM Medical Record Number: [id number] Patient Account Number: [id number] Date of Birth/Sex: Treating RN: 11-Sep-1941 (82 y.o. [name] Primary Care Brayon Bielefeld: Debbra Riding Other Clinician: Referring Lasundra Hascall: Treating Latangela Mccomas/Extender: Jasmine Pang in Treatment: 2 Visit Information History Since Last Visit Added or deleted any medications: No Patient Arrived: Dan Humphreys Any new allergies or adverse reactions: No Arrival Time: 13:11 Has Dressing in Place as Prescribed: Yes Accompanied By: daughter in law Pain Present Now: No Transfer Assistance: Manual Patient Identification Verified: Yes Secondary Verification Process Completed: Yes Patient Requires Transmission-Based Precautions: No Patient Has Alerts: Yes Patient Alerts: Patient on Blood Thinner diabetes type 2 Plavix Electronic Signature(s) Signed: 07/04/2023 4:59:36 PM By: Midge Aver MSN RN CNS WTA Entered By: Midge Aver on 07/04/2023 16:59:35 -------------------------------------------------------------------------------- Clinic Level of Care Assessment Details Patient Name: Date of Service: [name], [name] 07/04/2023 1:15 PM Medical Record Number: [id number] Patient Account Number: [id number] Date of Birth/Sex: Treating RN: Jun 14, 1941 (82 y.o. [name] Primary Care Jebediah Macrae: Debbra Riding Other Clinician: Referring Tajae Rybicki: Treating Arohi Salvatierra/Extender: Jasmine Pang in Treatment: 2 Clinic Level of Care Assessment Items TOOL 4 Quantity Score X- 1 0 Use when only an EandM is performed on FOLLOW-UP visit ASSESSMENTS - Nursing Assessment / Reassessment X- 1 10 Reassessment of Co-morbidities (includes updates in patient status) X- 1 5 Reassessment of  Adherence to Treatment Plan ASSESSMENTS - Wound and Skin A ssessment / Reassessment []  - 0 Simple Wound Assessment / Reassessment - one wound AMERICUS, KORBY D ([id number]) [id number] Page 2 of 15 X- 3 5 Complex Wound Assessment / Reassessment - multiple wounds []  - 0 Dermatologic / Skin Assessment (not related to wound area) ASSESSMENTS - Focused Assessment []  - 0 Circumferential Edema Measurements - multi extremities []  - 0 Nutritional Assessment / Counseling / Intervention []  - 0 Lower Extremity Assessment (monofilament, tuning fork, pulses) []  - 0 Peripheral Arterial Disease Assessment (using hand held doppler) ASSESSMENTS - Ostomy and/or Continence Assessment and Care []  - 0 Incontinence Assessment and Management []  - 0 Ostomy Care Assessment and Management (repouching, etc.) PROCESS - Coordination of Care []  - 0 Simple Patient / Family Education for ongoing care X- 1 20 Complex (extensive) Patient / Family Education for ongoing care X- 1 10 Staff obtains Chiropractor, Records, T Results / Process Orders est []  - 0 Staff telephones HHA, Nursing Homes / Clarify orders / etc []  - 0 Routine Transfer to another Facility (non-emergent condition) []  - 0 Routine Hospital Admission (non-emergent condition) []  - 0 New Admissions / Manufacturing engineer / Ordering NPWT Apligraf, etc. , []  - 0 Emergency Hospital Admission (emergent condition) []  - 0 Simple Discharge Coordination X- 1 15 Complex (extensive) Discharge Coordination PROCESS - Special Needs []  - 0 Pediatric / Minor Patient Management []  - 0 Isolation Patient Management []  - 0 Hearing / Language / Visual special needs []  - 0 Assessment of Community assistance (transportation, D/C planning, etc.) []  - 0 Additional assistance / Altered mentation []  - 0 Support Surface(s) Assessment (bed, cushion, seat, etc.) INTERVENTIONS - Wound Cleansing / Measurement []  - 0 Simple Wound  Cleansing - one wound X- 3 5 Complex Wound Cleansing - multiple wounds X- 1 5 Wound Imaging (photographs - any number of wounds) []  - 0 Wound Tracing (instead of photographs) []  - 0  silicone bordered dressings under wraps. Use non-bordered dressing only. Secured With Medipore T - 60M Medipore H Soft Cloth Surgical T ape ape, 2x2 (in/yd) Kerlix Roll Sterile or Non-Sterile 6-ply 4.5x4 (yd/yd) Discharge Instruction: Apply Kerlix as directed Compression Wrap Compression Stockings Add-Ons Electronic Signature(s) Signed: 07/04/2023 5:31:34 PM By: Midge Aver MSN RN CNS WTA Entered By: Midge Aver on 07/04/2023 13:38:18 Peter Congo D ([id number]) [id number] Page 12 of 15 -------------------------------------------------------------------------------- Wound Assessment Details Patient Name: Date of Service: [name], [name] 07/04/2023 1:15 PM Medical Record Number: [id number] Patient Account Number: [id number] Date of Birth/Sex: Treating RN: 1941/06/18 (82 y.o. [name] Primary Care Seraphim Affinito: Debbra Riding Other Clinician: Referring Ramanda Paules: Treating Archer Moist/Extender: Jasmine Pang in Treatment: 2 Wound Status Wound Number: 3 Primary Diabetic Wound/Ulcer of the Lower Extremity Etiology: Wound Location: Right, Lateral Foot Wound Open Wounding Event: Footwear Injury Status: Date Acquired: 06/16/2023 Comorbid Congestive Heart Failure, Hypertension, Peripheral Venous Weeks Of  Treatment: 2 History: Disease, Type II Diabetes, Neuropathy Clustered Wound: No Photos Wound Measurements Length: (cm) 1 Width: (cm) 0.8 Depth: (cm) 0.1 Area: (cm) 0.628 Volume: (cm) 0.063 % Reduction in Area: 60% % Reduction in Volume: 59.9% Epithelialization: None Wound Description Classification: Grade 2 Exudate Amount: Medium Exudate Type: Serosanguineous Exudate Color: red, brown Foul Odor After Cleansing: No Slough/Fibrino No Wound Bed Granulation Amount: Medium (34-66%) Exposed Structure Granulation Quality: Pink, Pale Fascia Exposed: No Necrotic Amount: None Present (0%) Fat Layer (Subcutaneous Tissue) Exposed: No Tendon Exposed: No Muscle Exposed: No Joint Exposed: No Bone Exposed: No Treatment Notes Wound #3 (Foot) Wound Laterality: Right, Lateral Cleanser Soap and Water Discharge Instruction: Gently cleanse wound with antibacterial soap, rinse and pat dry prior to dressing wounds Peri-Wound Care NUCHEM, GREVE ([id number]) [id number] Page 13 of 15 Topical Primary Dressing Silvercel Small 2x2 (in/in) Discharge Instruction: Apply Silvercel Small 2x2 (in/in) as instructed Secondary Dressing (BORDER) Zetuvit Plus SILICONE BORDER Dressing 4x4 (in/in) Discharge Instruction: Please do not put silicone bordered dressings under wraps. Use non-bordered dressing only. Secured With Medipore T - 60M Medipore H Soft Cloth Surgical T ape ape, 2x2 (in/yd) Kerlix Roll Sterile or Non-Sterile 6-ply 4.5x4 (yd/yd) Discharge Instruction: Apply Kerlix as directed Compression Wrap Compression Stockings Add-Ons Electronic Signature(s) Signed: 07/04/2023 5:31:34 PM By: Midge Aver MSN RN CNS WTA Entered By: Midge Aver on 07/04/2023 13:38:39 -------------------------------------------------------------------------------- Wound Assessment Details Patient Name: Date of Service: [name], [place] RD D. 07/04/2023 1:15 PM Medical Record Number:  [id number] Patient Account Number: [id number] Date of Birth/Sex: Treating RN: [birth year] (82 y.o. [name] Primary Care Sholanda Croson: Debbra Riding Other Clinician: Referring Tegan Burnside: Treating [place] Droll/Extender: Jasmine Pang in Treatment: 2 Wound Status Wound Number: 4 Primary Diabetic Wound/Ulcer of the Lower Extremity Etiology: Wound Location: Right, Medial Foot Wound Open Wounding Event: Footwear Injury Status: Date Acquired: 06/16/2023 Comorbid Congestive Heart Failure, Hypertension, Peripheral Venous Weeks Of Treatment: 2 History: Disease, Type II Diabetes, Neuropathy Clustered Wound: No Photos Wound Measurements Length: (cm) 0.1 Width: (cm) 0.1 Depth: (cm) 0.1 Area: (cm) 0.008 Dicicco, Durand D ([id number]) Volume: (cm) 0.001 % Reduction in Area: 96.4% % Reduction in Volume: 95.5% Epithelialization: Small (1-33%) [id number] Page 14 of 15 Wound Description Classification: Grade 2 Exudate Amount: Medium Exudate Type: Serosanguineous Exudate Color: red, brown Foul Odor After Cleansing: No Slough/Fibrino No Wound Bed Granulation Amount: Small (1-33%) Exposed Structure Granulation Quality: Pale Fascia Exposed: No Necrotic Amount: None Present (0%) Fat Layer (Subcutaneous Tissue) Exposed: Yes Tendon Exposed: No Muscle  Simple Wound Measurement - one wound X- 3 5 Complex Wound Measurement - multiple wounds INTERVENTIONS - Wound Dressings X - Small Wound Dressing one or multiple wounds 3 10 []  - 0 Medium Wound Dressing one or multiple wounds []  - 0 Large Wound Dressing one or multiple wounds []  - 0 Application of Medications - topical []  - 0 Application of Medications - injection INTERVENTIONS - Miscellaneous []  - 0 External ear exam []  - 0 Specimen Collection (cultures, biopsies, blood, body fluids, etc.) MESIYAH, COOPRIDER ([id number]) [id number] Page 3 of 15 []  - 0 Specimen(s) / Culture(s) sent or taken to Lab for analysis []  - 0 Patient Transfer (multiple staff / Michiel Sites Lift / Similar devices) []  - 0 Simple Staple / Suture removal (25 or less) []  - 0 Complex Staple / Suture removal (26 or more) []  - 0 Hypo / Hyperglycemic Management (close monitor of Blood Glucose) []  - 0 Ankle / Brachial Index (ABI) - do not check if billed separately X- 1 5 Vital Signs Has the patient been seen at the hospital within the last three years: Yes Total Score: 145 Level Of Care: New/Established - Level 4 Electronic Signature(s) Signed: 07/04/2023 5:31:34 PM By: Midge Aver MSN RN CNS WTA Entered By: Midge Aver on 07/04/2023 14:09:36 -------------------------------------------------------------------------------- Encounter Discharge Information Details Patient Name: Date of Service: [name], [place] RD D. 07/04/2023 1:15 PM Medical Record Number: [id number] Patient Account Number: [id number] Date of Birth/Sex: Treating RN: 05/23/1941 (82 y.o. [name] Primary Care Coleton Woon: Debbra Riding Other Clinician: Referring Emmerich Cryer: Treating Corabelle Spackman/Extender: Jasmine Pang in Treatment: 2 Encounter Discharge Information Items Discharge Condition: Stable Ambulatory Status: Wheelchair Discharge Destination: Home Transportation: Private Auto Accompanied By: daughter in law Schedule Follow-up Appointment: Yes Clinical Summary of Care: Electronic Signature(s) Signed: 07/04/2023 5:02:44 PM By: Midge Aver MSN RN CNS WTA Entered By: Midge Aver on 07/04/2023 17:02:44 -------------------------------------------------------------------------------- Lower Extremity Assessment Details Patient Name: Date of Service: [name], [place] RD D. 07/04/2023 1:15 PM Medical Record Number: [id number] Patient Account Number: [id number] Date of Birth/Sex: Treating RN: [birth year] (82 y.o. Carlee, [place], [place] D ([id number]) [id number] Page 4 of 15 Primary Care Shilah Hefel: Debbra Riding Other Clinician: Referring Janziel Hockett: Treating Darica Goren/Extender: Jasmine Pang in Treatment: 2 Electronic Signature(s) Signed: 07/04/2023 5:31:34 PM By: Midge Aver MSN RN CNS WTA Entered By: Midge Aver on 07/04/2023 13:39:08 -------------------------------------------------------------------------------- Multi Wound Chart Details Patient Name: Date of Service: [name], [place] RD D. 07/04/2023 1:15 PM Medical Record Number: [id number] Patient Account Number: [id number] Date of Birth/Sex: Treating RN: 1941-04-21 (82 y.o. [name] Primary Care Allisyn Kunz: Debbra Riding Other Clinician: Referring Ladarrian Asencio: Treating Jeremias Broyhill/Extender: Jasmine Pang in Treatment: 2 Vital Signs Height(in): 68 Pulse(bpm): 79 Weight(lbs): 167 Blood Pressure(mmHg): 110/58 Body Mass Index(BMI): 25.4 Temperature(F): 98.3 Respiratory Rate(breaths/min): 18 [1:Photos:] Right Amputation Site - Toe Right, Plantar Foot Right, Lateral Foot Wound Location: Surgical Injury Footwear Injury Footwear Injury Wounding  Event: Dehisced Wound Diabetic Wound/Ulcer of the Lower Diabetic Wound/Ulcer of the Lower Primary Etiology: Extremity Extremity Congestive Heart Failure, Congestive Heart Failure, Congestive Heart Failure, Comorbid History: Hypertension, Peripheral Venous Hypertension, Peripheral Venous Hypertension, Peripheral Venous Disease, Type II Diabetes, Disease, Type II Diabetes, Disease, Type II Diabetes, Neuropathy Neuropathy Neuropathy 04/16/2023 06/16/2023 06/16/2023 Date Acquired: 2 2 2  Weeks of Treatment: Open Open Open Wound Status: No No No Wound Recurrence: 1x0.5x0.8 1x1.2x0.1 1x0.8x0.1 Measurements L x W x D (cm) 0.393 0.942 0.628 A (cm) :  Simple Wound Measurement - one wound X- 3 5 Complex Wound Measurement - multiple wounds INTERVENTIONS - Wound Dressings X - Small Wound Dressing one or multiple wounds 3 10 []  - 0 Medium Wound Dressing one or multiple wounds []  - 0 Large Wound Dressing one or multiple wounds []  - 0 Application of Medications - topical []  - 0 Application of Medications - injection INTERVENTIONS - Miscellaneous []  - 0 External ear exam []  - 0 Specimen Collection (cultures, biopsies, blood, body fluids, etc.) MESIYAH, COOPRIDER ([id number]) [id number] Page 3 of 15 []  - 0 Specimen(s) / Culture(s) sent or taken to Lab for analysis []  - 0 Patient Transfer (multiple staff / Michiel Sites Lift / Similar devices) []  - 0 Simple Staple / Suture removal (25 or less) []  - 0 Complex Staple / Suture removal (26 or more) []  - 0 Hypo / Hyperglycemic Management (close monitor of Blood Glucose) []  - 0 Ankle / Brachial Index (ABI) - do not check if billed separately X- 1 5 Vital Signs Has the patient been seen at the hospital within the last three years: Yes Total Score: 145 Level Of Care: New/Established - Level 4 Electronic Signature(s) Signed: 07/04/2023 5:31:34 PM By: Midge Aver MSN RN CNS WTA Entered By: Midge Aver on 07/04/2023 14:09:36 -------------------------------------------------------------------------------- Encounter Discharge Information Details Patient Name: Date of Service: [name], [place] RD D. 07/04/2023 1:15 PM Medical Record Number: [id number] Patient Account Number: [id number] Date of Birth/Sex: Treating RN: 05/23/1941 (82 y.o. [name] Primary Care Coleton Woon: Debbra Riding Other Clinician: Referring Emmerich Cryer: Treating Corabelle Spackman/Extender: Jasmine Pang in Treatment: 2 Encounter Discharge Information Items Discharge Condition: Stable Ambulatory Status: Wheelchair Discharge Destination: Home Transportation: Private Auto Accompanied By: daughter in law Schedule Follow-up Appointment: Yes Clinical Summary of Care: Electronic Signature(s) Signed: 07/04/2023 5:02:44 PM By: Midge Aver MSN RN CNS WTA Entered By: Midge Aver on 07/04/2023 17:02:44 -------------------------------------------------------------------------------- Lower Extremity Assessment Details Patient Name: Date of Service: [name], [place] RD D. 07/04/2023 1:15 PM Medical Record Number: [id number] Patient Account Number: [id number] Date of Birth/Sex: Treating RN: [birth year] (82 y.o. Carlee, [place], [place] D ([id number]) [id number] Page 4 of 15 Primary Care Shilah Hefel: Debbra Riding Other Clinician: Referring Janziel Hockett: Treating Darica Goren/Extender: Jasmine Pang in Treatment: 2 Electronic Signature(s) Signed: 07/04/2023 5:31:34 PM By: Midge Aver MSN RN CNS WTA Entered By: Midge Aver on 07/04/2023 13:39:08 -------------------------------------------------------------------------------- Multi Wound Chart Details Patient Name: Date of Service: [name], [place] RD D. 07/04/2023 1:15 PM Medical Record Number: [id number] Patient Account Number: [id number] Date of Birth/Sex: Treating RN: 1941-04-21 (82 y.o. [name] Primary Care Allisyn Kunz: Debbra Riding Other Clinician: Referring Ladarrian Asencio: Treating Jeremias Broyhill/Extender: Jasmine Pang in Treatment: 2 Vital Signs Height(in): 68 Pulse(bpm): 79 Weight(lbs): 167 Blood Pressure(mmHg): 110/58 Body Mass Index(BMI): 25.4 Temperature(F): 98.3 Respiratory Rate(breaths/min): 18 [1:Photos:] Right Amputation Site - Toe Right, Plantar Foot Right, Lateral Foot Wound Location: Surgical Injury Footwear Injury Footwear Injury Wounding  Event: Dehisced Wound Diabetic Wound/Ulcer of the Lower Diabetic Wound/Ulcer of the Lower Primary Etiology: Extremity Extremity Congestive Heart Failure, Congestive Heart Failure, Congestive Heart Failure, Comorbid History: Hypertension, Peripheral Venous Hypertension, Peripheral Venous Hypertension, Peripheral Venous Disease, Type II Diabetes, Disease, Type II Diabetes, Disease, Type II Diabetes, Neuropathy Neuropathy Neuropathy 04/16/2023 06/16/2023 06/16/2023 Date Acquired: 2 2 2  Weeks of Treatment: Open Open Open Wound Status: No No No Wound Recurrence: 1x0.5x0.8 1x1.2x0.1 1x0.8x0.1 Measurements L x W x D (cm) 0.393 0.942 0.628 A (cm) :  Exposed: No Joint Exposed: No Bone Exposed: No Treatment Notes Wound #4 (Foot) Wound Laterality: Right, Medial Cleanser Soap and Water Discharge Instruction: Gently cleanse wound with antibacterial soap, rinse and pat dry prior to dressing wounds Peri-Wound Care Topical Primary Dressing Silvercel Small 2x2 (in/in) Discharge Instruction: Apply Silvercel Small 2x2 (in/in) as instructed Secondary Dressing (BORDER) Zetuvit Plus SILICONE BORDER Dressing 5x5 (in/in) Discharge Instruction: Please do not put silicone bordered dressings under wraps. Use non-bordered dressing only. Secured  With Compression Wrap Compression Stockings Facilities manager) Signed: 07/04/2023 5:31:34 PM By: Midge Aver MSN RN CNS WTA Entered By: Midge Aver on 07/04/2023 13:38:57 -------------------------------------------------------------------------------- Vitals Details Patient Name: Date of Service: [name], [place] RD D. 07/04/2023 1:15 PM Medical Record Number: [id number] Patient Account Number: [id number] Date of Birth/Sex: Treating RN: 1941-03-16 (82 y.o. [name] Primary Care Shatia Sindoni: Debbra Riding Other Clinician: Referring Remi Lopata: Treating Baani Bober/Extender: Jasmine Pang in Treatment: 2 Vital Signs Time Taken: 13:15 Temperature (F): 98.3 Height (in): 68 Pulse (bpm): [address] D ([id number]) [id number] Page 15 of 15 Weight (lbs): 167 Respiratory Rate (breaths/min): 18 Body Mass Index (BMI): 25.4 Blood Pressure (mmHg): 110/58 Reference Range: 80 - 120 mg / dl Electronic Signature(s) Signed: 07/04/2023 5:31:34 PM By: Midge Aver MSN RN CNS WTA Entered By: Midge Aver on 07/04/2023 13:20:06  [name], [name] ([id number]) [id number] Page 1 of 15 Visit Report for 07/04/2023 Arrival Information Details Patient Name: Date of Service: [name], [name] 07/04/2023 1:15 PM Medical Record Number: [id number] Patient Account Number: [id number] Date of Birth/Sex: Treating RN: 11-Sep-1941 (82 y.o. [name] Primary Care Brayon Bielefeld: Debbra Riding Other Clinician: Referring Lasundra Hascall: Treating Latangela Mccomas/Extender: Jasmine Pang in Treatment: 2 Visit Information History Since Last Visit Added or deleted any medications: No Patient Arrived: Dan Humphreys Any new allergies or adverse reactions: No Arrival Time: 13:11 Has Dressing in Place as Prescribed: Yes Accompanied By: daughter in law Pain Present Now: No Transfer Assistance: Manual Patient Identification Verified: Yes Secondary Verification Process Completed: Yes Patient Requires Transmission-Based Precautions: No Patient Has Alerts: Yes Patient Alerts: Patient on Blood Thinner diabetes type 2 Plavix Electronic Signature(s) Signed: 07/04/2023 4:59:36 PM By: Midge Aver MSN RN CNS WTA Entered By: Midge Aver on 07/04/2023 16:59:35 -------------------------------------------------------------------------------- Clinic Level of Care Assessment Details Patient Name: Date of Service: [name], [name] 07/04/2023 1:15 PM Medical Record Number: [id number] Patient Account Number: [id number] Date of Birth/Sex: Treating RN: Jun 14, 1941 (82 y.o. [name] Primary Care Jebediah Macrae: Debbra Riding Other Clinician: Referring Tajae Rybicki: Treating Arohi Salvatierra/Extender: Jasmine Pang in Treatment: 2 Clinic Level of Care Assessment Items TOOL 4 Quantity Score X- 1 0 Use when only an EandM is performed on FOLLOW-UP visit ASSESSMENTS - Nursing Assessment / Reassessment X- 1 10 Reassessment of Co-morbidities (includes updates in patient status) X- 1 5 Reassessment of  Adherence to Treatment Plan ASSESSMENTS - Wound and Skin A ssessment / Reassessment []  - 0 Simple Wound Assessment / Reassessment - one wound AMERICUS, KORBY D ([id number]) [id number] Page 2 of 15 X- 3 5 Complex Wound Assessment / Reassessment - multiple wounds []  - 0 Dermatologic / Skin Assessment (not related to wound area) ASSESSMENTS - Focused Assessment []  - 0 Circumferential Edema Measurements - multi extremities []  - 0 Nutritional Assessment / Counseling / Intervention []  - 0 Lower Extremity Assessment (monofilament, tuning fork, pulses) []  - 0 Peripheral Arterial Disease Assessment (using hand held doppler) ASSESSMENTS - Ostomy and/or Continence Assessment and Care []  - 0 Incontinence Assessment and Management []  - 0 Ostomy Care Assessment and Management (repouching, etc.) PROCESS - Coordination of Care []  - 0 Simple Patient / Family Education for ongoing care X- 1 20 Complex (extensive) Patient / Family Education for ongoing care X- 1 10 Staff obtains Chiropractor, Records, T Results / Process Orders est []  - 0 Staff telephones HHA, Nursing Homes / Clarify orders / etc []  - 0 Routine Transfer to another Facility (non-emergent condition) []  - 0 Routine Hospital Admission (non-emergent condition) []  - 0 New Admissions / Manufacturing engineer / Ordering NPWT Apligraf, etc. , []  - 0 Emergency Hospital Admission (emergent condition) []  - 0 Simple Discharge Coordination X- 1 15 Complex (extensive) Discharge Coordination PROCESS - Special Needs []  - 0 Pediatric / Minor Patient Management []  - 0 Isolation Patient Management []  - 0 Hearing / Language / Visual special needs []  - 0 Assessment of Community assistance (transportation, D/C planning, etc.) []  - 0 Additional assistance / Altered mentation []  - 0 Support Surface(s) Assessment (bed, cushion, seat, etc.) INTERVENTIONS - Wound Cleansing / Measurement []  - 0 Simple Wound  Cleansing - one wound X- 3 5 Complex Wound Cleansing - multiple wounds X- 1 5 Wound Imaging (photographs - any number of wounds) []  - 0 Wound Tracing (instead of photographs) []  - 0

## 2023-07-04 NOTE — Progress Notes (Addendum)
pr Gloves Cleanser: Dakin 16 (oz) 0.25 1 x Per Day/30 Days Discharge Instructions: Use as directed. Prim Dressing: Gauze (Generic) 1 x Per Day/30 Days ary Discharge Instructions: As directed: dry, moistened with saline or moistened with Dakins Solution Secondary Dressing: Conforming Guaze Roll-Large (Generic) 1 x Per Day/30 Days Discharge Instructions: Apply Conforming Stretch Guaze Bandage as directed Secured With: Medipore T - 21M Medipore H Soft Cloth Surgical T ape ape, 2x2 (in/yd) (Generic) 1 x Per Day/30 Days Wound #2 - Foot Wound Laterality: Plantar, Right Cleanser: Soap and Water 3 x Per Week/30 Days Discharge Instructions: Gently cleanse wound with antibacterial soap, rinse and pat dry prior to dressing wounds Prim Dressing: Silvercel Small 2x2 (in/in) (DME) (Dispense As Written) 3 x Per Week/30 Days ary Discharge Instructions: Apply Silvercel Small 2x2 (in/in) as instructed Secondary Dressing: (BORDER) Zetuvit Plus SILICONE BORDER Dressing 4x4 (in/in) (DME) (Generic) 3 x Per Week/30 Days Discharge Instructions: Please do not put silicone bordered dressings under wraps. Use non-bordered dressing only. Secured With: Medipore T - 21M Medipore H Soft Cloth Surgical T ape ape, 2x2 (in/yd) (DME) (Generic) 3 x Per Week/30 Days Secured With: [employer] Sterile or Non-Sterile 6-ply 4.5x4 (yd/yd) (DME) (Generic) 3 x Per Week/30 Days Discharge Instructions: Apply Kerlix as directed Wound #3 - Foot Wound Laterality: Right, Lateral Cleanser: Soap and Water 3 x Per Week/30 Days Discharge Instructions: Gently cleanse wound with antibacterial soap, rinse and pat dry prior to dressing wounds Prim Dressing: Silvercel Small 2x2 (in/in) (DME) (Dispense As Written) 3 x Per Week/30  Days ary Discharge Instructions: Apply Silvercel Small 2x2 (in/in) as instructed Secondary Dressing: (BORDER) Zetuvit Plus SILICONE BORDER Dressing 4x4 (in/in) (DME) (Generic) 3 x Per Week/30 Days Discharge Instructions: Please do not put silicone bordered dressings under wraps. Use non-bordered dressing only. Secured With: Medipore T - 21M Medipore H Soft Cloth Surgical T ape ape, 2x2 (in/yd) (DME) (Generic) 3 x Per Week/30 Days Secured With: Kerlix Roll Sterile or Non-Sterile 6-ply 4.5x4 (yd/yd) (DME) (Generic) 3 x Per Week/30 Days Discharge Instructions: Apply Kerlix as directed Wound #4 - Foot Wound Laterality: Right, Medial DEMARION, OVERBAUGH D ([id number]) [id number] Page 4 of 8 Cleanser: Soap and Water 3 x Per Week/30 Days Discharge Instructions: Gently cleanse wound with antibacterial soap, rinse and pat dry prior to dressing wounds Prim Dressing: Silvercel Small 2x2 (in/in) 3 x Per Week/30 Days ary Discharge Instructions: Apply Silvercel Small 2x2 (in/in) as instructed Secondary Dressing: (BORDER) Zetuvit Plus SILICONE BORDER Dressing 5x5 (in/in) 3 x Per Week/30 Days Discharge Instructions: Please do not put silicone bordered dressings under wraps. Use non-bordered dressing only. Electronic Signature(s) Signed: 07/04/2023 5:31:22 PM By: Allen Derry PA-C Signed: 07/04/2023 5:31:34 PM By: Midge Aver MSN RN CNS WTA Entered By: Midge Aver on 07/04/2023 14:08:42 -------------------------------------------------------------------------------- Problem List Details Patient Name: Date of Service: [name], HO [place] RD D. 07/04/2023 1:15 PM Medical Record Number: [id number] Patient Account Number: [id number] Date of Birth/Sex: Treating RN: 1941/09/05 (82 y.o. [name] Primary Care Provider: Debbra Riding Other Clinician: Referring Provider: Treating Provider/Extender: Jasmine Pang in Treatment: 2 Active  Problems ICD-10 Encounter Code Description Active Date MDM Diagnosis T81.31XA Disruption of external operation (surgical) wound, not elsewhere classified, 06/20/2023 No Yes initial encounter E11.621 Type 2 diabetes mellitus with foot ulcer 06/20/2023 No Yes L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 06/20/2023 No Yes I10 Essential (primary) hypertension 06/20/2023 No Yes 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 06/20/2023  be a little bit too moist in regard to the wounds on his feet in general. I do believe that he would benefit from a switching dressings to silver alginate away from the Xeroform at this point unfortunately. With that being said the original wound for which she was referred actually does appear to be doing okay at this point in my opinion. I am actually somewhat pleased with where we stand in that regard. I do not see any evidence of active infection at this time which is good. I think the Dakin's moistened gauze is helping. 07-04-2023 upon evaluation today patient appears to be doing well currently in regard to his wounds all things considered. I do believe that he is making headway towards some closure and this is good news. Fortunately I do not see any evidence of active infection looking or systemically which is great news as well. No fevers, chills, nausea, vomiting, or diarrhea. Electronic Signature(s) Signed: 07/04/2023 2:26:53 PM By: Allen Derry PA-C Entered By: Allen Derry on 07/04/2023 14:26:53 -------------------------------------------------------------------------------- Physical Exam Details Patient Name: Date of Service: [name], [name] 07/04/2023 1:15 PM Medical Record Number: [id number] Patient Account Number: [id number] Date of Birth/Sex: Treating RN: 1940/10/10 (82 y.o. [name] Primary Care Provider: Debbra Riding Other Clinician: Referring Provider: Treating Provider/Extender: Jasmine Pang in Treatment: 2 Constitutional Well-nourished and well-hydrated in no acute distress. Respiratory normal breathing without difficulty. Psychiatric this patient is able to make decisions and demonstrates good insight into disease process. Alert and Oriented x 3. pleasant and cooperative. Notes Upon inspection patient's wounds again are showing signs of improvement reason alginate to all areas  and though it is very slow to heal I do think we are seeing healing. As far as the amputation site which seems Dakin's moistened gauze at this time to that location I think it is cleaning up but is just very slow. I discussed with the patient that the healing is going to be very slow as far as that is concerned and that is to be expected with what we have going on at this point. Electronic Signature(s) Signed: 07/04/2023 2:27:26 PM By: Allen Derry PA-C Entered By: Allen Derry on 07/04/2023 14:27:26 -------------------------------------------------------------------------------- Physician Orders Details Patient Name: Date of Service: [name], HO [place] RD D. 07/04/2023 1:15 PM Medical Record Number: [id number] Patient Account Number: [id number] SHY, BRUSH ([id number]) [phone].pdf Page 3 of 8 Date of Birth/Sex: Treating RN: 1941-01-25 (82 y.o. [name] Primary Care Provider: Debbra Riding Other Clinician: Referring Provider: Treating Provider/Extender: Jasmine Pang in Treatment: 2 Verbal / Phone Orders: No Diagnosis Coding ICD-10 Coding Code Description T81.31XA Disruption of external operation (surgical) wound, not elsewhere classified, initial encounter E11.621 Type 2 diabetes mellitus with foot ulcer L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 I10 Essential (primary) hypertension 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 Z79.01 Long term (current) use of anticoagulants 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 Bathing/ [employer] wounds with antibacterial soap and water. May shower; gently cleanse wound with antibacterial soap, rinse and pat dry prior to dressing wounds Wound Treatment Wound #1 - Amputation Site  - Toe Wound Laterality: Right Cleanser: Byram Ancillary Kit - 15 Day Supply (Generic) 1 x Per Day/30 Days Discharge Instructions: Use supplies as instructed; Kit contains: (15) Saline Bullets; (15) 3x3 Gauze; 15  be a little bit too moist in regard to the wounds on his feet in general. I do believe that he would benefit from a switching dressings to silver alginate away from the Xeroform at this point unfortunately. With that being said the original wound for which she was referred actually does appear to be doing okay at this point in my opinion. I am actually somewhat pleased with where we stand in that regard. I do not see any evidence of active infection at this time which is good. I think the Dakin's moistened gauze is helping. 07-04-2023 upon evaluation today patient appears to be doing well currently in regard to his wounds all things considered. I do believe that he is making headway towards some closure and this is good news. Fortunately I do not see any evidence of active infection looking or systemically which is great news as well. No fevers, chills, nausea, vomiting, or diarrhea. Electronic Signature(s) Signed: 07/04/2023 2:26:53 PM By: Allen Derry PA-C Entered By: Allen Derry on 07/04/2023 14:26:53 -------------------------------------------------------------------------------- Physical Exam Details Patient Name: Date of Service: [name], [name] 07/04/2023 1:15 PM Medical Record Number: [id number] Patient Account Number: [id number] Date of Birth/Sex: Treating RN: 1940/10/10 (82 y.o. [name] Primary Care Provider: Debbra Riding Other Clinician: Referring Provider: Treating Provider/Extender: Jasmine Pang in Treatment: 2 Constitutional Well-nourished and well-hydrated in no acute distress. Respiratory normal breathing without difficulty. Psychiatric this patient is able to make decisions and demonstrates good insight into disease process. Alert and Oriented x 3. pleasant and cooperative. Notes Upon inspection patient's wounds again are showing signs of improvement reason alginate to all areas  and though it is very slow to heal I do think we are seeing healing. As far as the amputation site which seems Dakin's moistened gauze at this time to that location I think it is cleaning up but is just very slow. I discussed with the patient that the healing is going to be very slow as far as that is concerned and that is to be expected with what we have going on at this point. Electronic Signature(s) Signed: 07/04/2023 2:27:26 PM By: Allen Derry PA-C Entered By: Allen Derry on 07/04/2023 14:27:26 -------------------------------------------------------------------------------- Physician Orders Details Patient Name: Date of Service: [name], HO [place] RD D. 07/04/2023 1:15 PM Medical Record Number: [id number] Patient Account Number: [id number] SHY, BRUSH ([id number]) [phone].pdf Page 3 of 8 Date of Birth/Sex: Treating RN: 1941-01-25 (82 y.o. [name] Primary Care Provider: Debbra Riding Other Clinician: Referring Provider: Treating Provider/Extender: Jasmine Pang in Treatment: 2 Verbal / Phone Orders: No Diagnosis Coding ICD-10 Coding Code Description T81.31XA Disruption of external operation (surgical) wound, not elsewhere classified, initial encounter E11.621 Type 2 diabetes mellitus with foot ulcer L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 I10 Essential (primary) hypertension 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 Z79.01 Long term (current) use of anticoagulants 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 Bathing/ [employer] wounds with antibacterial soap and water. May shower; gently cleanse wound with antibacterial soap, rinse and pat dry prior to dressing wounds Wound Treatment Wound #1 - Amputation Site  - Toe Wound Laterality: Right Cleanser: Byram Ancillary Kit - 15 Day Supply (Generic) 1 x Per Day/30 Days Discharge Instructions: Use supplies as instructed; Kit contains: (15) Saline Bullets; (15) 3x3 Gauze; 15  JAVANI, PFAUTZ ([id number]) [id number] Page 1 of 8 Visit Report for 07/04/2023 Chief Complaint Document Details Patient Name: Date of Service: [name], [name] 07/04/2023 1:15 PM Medical Record Number: [id number] Patient Account Number: [id number] Date of Birth/Sex: Treating RN: [birth year] (82 y.o. [name] Primary Care Provider: Debbra Riding Other Clinician: Referring Provider: Treating Provider/Extender: Jasmine Pang in Treatment: 2 Information Obtained from: Patient Chief Complaint Right foot ulcers Electronic Signature(s) Signed: 07/04/2023 1:08:29 PM By: Allen Derry PA-C Entered By: Allen Derry on 07/04/2023 13:08:29 -------------------------------------------------------------------------------- HPI Details Patient Name: Date of Service: [name], HO [place] RD D. 07/04/2023 1:15 PM Medical Record Number: [id number] Patient Account Number: [id number] Date of Birth/Sex: Treating RN: 10-11-1940 (82 y.o. [name] Primary Care Provider: Debbra Riding Other Clinician: Referring Provider: Treating Provider/Extender: Jasmine Pang in Treatment: 2 History of Present Illness Chronic/Inactive Conditions Condition 1: Notes from Vascular 05/21/23: Right below-the-knee popliteal artery to peroneal artery bypass with reverse saphenous vein graft. This was done on 04/11/2023. Noninvasive studies in our an ABI 0.61 on the right and 0.67 on the left. T oday arterial duplexes were done which shows biphasic/triphasic waveforms throughout the lower extremities down to the level of the tibial vessels. 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 This is suggestive that the patient's bypass is patent although it is not there is no noted occlusion on duplex 1. Atherosclerosis of native arteries of the  extremities with ulceration (HCC) Based on noninvasive studies today appears that the patient would have possible marginal ability for wound healing. Concerned that some of the patient is usually a small vessel disease which may proved to have a little bit more difficult. Will have the patient return in several weeks to see if there has been improvement. They are advised that if the wound deteriorates or begins to show signs of starting to contact us for sooner follow-up. 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 With that being said that area is somewhat deep to be perfectly honest. It does have a little bit concerned. The remaining areas all came from the boot he was wearing and areas that rubbed they have gotten rid of the boot this will not happen again as far as that is concerned. With that being said the patient has had vascular intervention and the note is above as documented in the vascular note. With that being said it does appear that the patient has what is termed "possible marginal ability for wound healing". I think this is basically something this can be quite difficult for [place] to completely close. I am not seeing them possible but again there is a long road ahead. Patient does have a history of diabetes mellitus type 2, hypertension, peripheral vascular disease, coronary artery disease, long-term use of anticoagulants, ZOLAN, UTTLEY D ([id number]) [id number] Page 2 of 8 chronic kidney disease stage III, and as of right now there is no direct evidence of continued and ongoing osteomyelitis although I do question whether or not there may be an issue here. 06-27-2023 upon evaluation today patient appears to be doing okay in regard to his wounds. He is showing signs of some  improvement which is good news although this appears to  No Yes I25.10 Atherosclerotic heart disease of native coronary artery without angina pectoris 06/20/2023 No Yes Z79.01 Long term (current) use of anticoagulants 06/20/2023 No Yes 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, stage 3 unspecified 06/20/2023 No Yes RUEGER, WEIDEL ([id number]) [phone].pdf Page 5 of 8 Inactive Problems Resolved Problems Electronic Signature(s) Signed: 07/04/2023 1:08:26 PM By: Allen Derry PA-C Entered By: Allen Derry on 07/04/2023 13:08:25 -------------------------------------------------------------------------------- Progress Note Details Patient Name: Date of Service: [name], [name] 07/04/2023 1:15 PM Medical Record Number: [id number] Patient Account Number: [id number] Date of Birth/Sex: Treating RN: 12-09-1940 (82 y.o. [name] Primary Care Provider: Debbra Riding Other Clinician: Referring Provider: Treating Provider/Extender: Jasmine Pang in Treatment: 2 Subjective Chief Complaint Information obtained from Patient Right foot ulcers History of Present Illness (HPI) Chronic/Inactive Condition: Notes from Vascular 05/21/23: Right below-the-knee popliteal artery to peroneal artery bypass with reverse saphenous vein graft. This was done on 04/11/2023. Noninvasive studies in our an ABI 0.61 on the right and 0.67 on the left. T oday arterial duplexes were done which shows biphasic/triphasic waveforms throughout the lower extremities down to  the level of the tibial vessels. 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 This is suggestive that the patient's bypass is patent although it is not there is no noted occlusion on duplex 1. Atherosclerosis of native arteries of the extremities with ulceration (HCC) Based on noninvasive studies today appears that the patient would have possible marginal ability for wound healing. Concerned that some of the patient is usually a small vessel disease which may proved to have a little bit more difficult. Will have the patient return in several weeks to see if there has been improvement. They are advised that if the wound deteriorates or begins to show signs of starting to contact us for sooner follow-up. 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 With that being said that area is somewhat deep to be perfectly honest. It does have a little bit concerned. The remaining areas all came from the boot he was wearing and areas that rubbed they have gotten rid of the boot this will not happen again as far as that is concerned. With that being said the patient has had vascular intervention and the note is above as documented in the vascular note. With that being said it does appear that the patient has what is termed "possible marginal ability for wound healing". I think this is basically something this can be quite difficult for [place] to completely close. I am not seeing them possible but again there is a long road ahead. Patient does have a history of diabetes mellitus type 2, hypertension, peripheral vascular disease, coronary artery disease, long-term use of anticoagulants, chronic kidney disease stage III, and as of right now there is no  direct evidence of continued and ongoing osteomyelitis although I do question whether or not there may be an issue here. 06-27-2023 upon evaluation today patient appears to be doing okay in regard to his wounds. He is showing signs of some improvement which is good news although this appears to be a little bit too moist in regard to the wounds on his feet in general. I do believe that he would benefit from a switching dressings to silver alginate  JAVANI, PFAUTZ ([id number]) [id number] Page 1 of 8 Visit Report for 07/04/2023 Chief Complaint Document Details Patient Name: Date of Service: [name], [name] 07/04/2023 1:15 PM Medical Record Number: [id number] Patient Account Number: [id number] Date of Birth/Sex: Treating RN: [birth year] (82 y.o. [name] Primary Care Provider: Debbra Riding Other Clinician: Referring Provider: Treating Provider/Extender: Jasmine Pang in Treatment: 2 Information Obtained from: Patient Chief Complaint Right foot ulcers Electronic Signature(s) Signed: 07/04/2023 1:08:29 PM By: Allen Derry PA-C Entered By: Allen Derry on 07/04/2023 13:08:29 -------------------------------------------------------------------------------- HPI Details Patient Name: Date of Service: [name], HO [place] RD D. 07/04/2023 1:15 PM Medical Record Number: [id number] Patient Account Number: [id number] Date of Birth/Sex: Treating RN: 10-11-1940 (82 y.o. [name] Primary Care Provider: Debbra Riding Other Clinician: Referring Provider: Treating Provider/Extender: Jasmine Pang in Treatment: 2 History of Present Illness Chronic/Inactive Conditions Condition 1: Notes from Vascular 05/21/23: Right below-the-knee popliteal artery to peroneal artery bypass with reverse saphenous vein graft. This was done on 04/11/2023. Noninvasive studies in our an ABI 0.61 on the right and 0.67 on the left. T oday arterial duplexes were done which shows biphasic/triphasic waveforms throughout the lower extremities down to the level of the tibial vessels. 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 This is suggestive that the patient's bypass is patent although it is not there is no noted occlusion on duplex 1. Atherosclerosis of native arteries of the  extremities with ulceration (HCC) Based on noninvasive studies today appears that the patient would have possible marginal ability for wound healing. Concerned that some of the patient is usually a small vessel disease which may proved to have a little bit more difficult. Will have the patient return in several weeks to see if there has been improvement. They are advised that if the wound deteriorates or begins to show signs of starting to contact us for sooner follow-up. 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 With that being said that area is somewhat deep to be perfectly honest. It does have a little bit concerned. The remaining areas all came from the boot he was wearing and areas that rubbed they have gotten rid of the boot this will not happen again as far as that is concerned. With that being said the patient has had vascular intervention and the note is above as documented in the vascular note. With that being said it does appear that the patient has what is termed "possible marginal ability for wound healing". I think this is basically something this can be quite difficult for [place] to completely close. I am not seeing them possible but again there is a long road ahead. Patient does have a history of diabetes mellitus type 2, hypertension, peripheral vascular disease, coronary artery disease, long-term use of anticoagulants, ZOLAN, UTTLEY D ([id number]) [id number] Page 2 of 8 chronic kidney disease stage III, and as of right now there is no direct evidence of continued and ongoing osteomyelitis although I do question whether or not there may be an issue here. 06-27-2023 upon evaluation today patient appears to be doing okay in regard to his wounds. He is showing signs of some  improvement which is good news although this appears to  JAVANI, PFAUTZ ([id number]) [id number] Page 1 of 8 Visit Report for 07/04/2023 Chief Complaint Document Details Patient Name: Date of Service: [name], [name] 07/04/2023 1:15 PM Medical Record Number: [id number] Patient Account Number: [id number] Date of Birth/Sex: Treating RN: [birth year] (82 y.o. [name] Primary Care Provider: Debbra Riding Other Clinician: Referring Provider: Treating Provider/Extender: Jasmine Pang in Treatment: 2 Information Obtained from: Patient Chief Complaint Right foot ulcers Electronic Signature(s) Signed: 07/04/2023 1:08:29 PM By: Allen Derry PA-C Entered By: Allen Derry on 07/04/2023 13:08:29 -------------------------------------------------------------------------------- HPI Details Patient Name: Date of Service: [name], HO [place] RD D. 07/04/2023 1:15 PM Medical Record Number: [id number] Patient Account Number: [id number] Date of Birth/Sex: Treating RN: 10-11-1940 (82 y.o. [name] Primary Care Provider: Debbra Riding Other Clinician: Referring Provider: Treating Provider/Extender: Jasmine Pang in Treatment: 2 History of Present Illness Chronic/Inactive Conditions Condition 1: Notes from Vascular 05/21/23: Right below-the-knee popliteal artery to peroneal artery bypass with reverse saphenous vein graft. This was done on 04/11/2023. Noninvasive studies in our an ABI 0.61 on the right and 0.67 on the left. T oday arterial duplexes were done which shows biphasic/triphasic waveforms throughout the lower extremities down to the level of the tibial vessels. 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 This is suggestive that the patient's bypass is patent although it is not there is no noted occlusion on duplex 1. Atherosclerosis of native arteries of the  extremities with ulceration (HCC) Based on noninvasive studies today appears that the patient would have possible marginal ability for wound healing. Concerned that some of the patient is usually a small vessel disease which may proved to have a little bit more difficult. Will have the patient return in several weeks to see if there has been improvement. They are advised that if the wound deteriorates or begins to show signs of starting to contact us for sooner follow-up. 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 With that being said that area is somewhat deep to be perfectly honest. It does have a little bit concerned. The remaining areas all came from the boot he was wearing and areas that rubbed they have gotten rid of the boot this will not happen again as far as that is concerned. With that being said the patient has had vascular intervention and the note is above as documented in the vascular note. With that being said it does appear that the patient has what is termed "possible marginal ability for wound healing". I think this is basically something this can be quite difficult for [place] to completely close. I am not seeing them possible but again there is a long road ahead. Patient does have a history of diabetes mellitus type 2, hypertension, peripheral vascular disease, coronary artery disease, long-term use of anticoagulants, ZOLAN, UTTLEY D ([id number]) [id number] Page 2 of 8 chronic kidney disease stage III, and as of right now there is no direct evidence of continued and ongoing osteomyelitis although I do question whether or not there may be an issue here. 06-27-2023 upon evaluation today patient appears to be doing okay in regard to his wounds. He is showing signs of some  improvement which is good news although this appears to

## 2023-07-09 ENCOUNTER — Ambulatory Visit: Payer: Medicare Other

## 2023-07-11 ENCOUNTER — Ambulatory Visit: Payer: Medicare Other

## 2023-07-11 ENCOUNTER — Encounter: Payer: Medicare Other | Admitting: Physician Assistant

## 2023-07-11 DIAGNOSIS — E11621 Type 2 diabetes mellitus with foot ulcer: Secondary | ICD-10-CM | POA: Diagnosis not present

## 2023-07-11 NOTE — Progress Notes (Addendum)
signs of infection or worsening. Based on what I am seeing I do believe that we are making headway towards closure this is good news. 2. I am going to recommend as well that we have the patient continue to monitor for any signs of infection or worsening. I do believe that her making good headway towards closure which is good news. 3. I would also  recommend that the patient should continue to utilize the silver alginate dressing to all wounds except for the amputation site where using Dakin's moistened gauze and the right lateral foot where we are using the Betadine and dry gauze. We will see patient back for reevaluation in 1 week here in the clinic. If anything worsens or changes patient will contact our office for additional recommendations. He should continue with the offloading shoe. Electronic Signature(s) Signed: 07/11/2023 8:30:09 AM By: Charles Derry PA-C Entered By: [name] on 07/11/2023 05:30:09 -------------------------------------------------------------------------------- SuperBill Details Patient Name: Date of Service: [name], HO [place] RD [name] 07/11/2023 Medical Record Number: [id number] Patient Account Number: [id number] Date of Birth/Sex: Treating RN: 01-Jun-1941 (82 y.o. 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 Z79.01 Long term (current) use of anticoagulants 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, stage 3 unspecified Facility Procedures : [name] CodeWeir Croff [name] ([id number] IC L Description: ) [id number] - DEB SUBQ TISSUE 20 SQ CM/< [name]-10 Diagnosis Description 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 Modifier: [id number] Page 1 1 Quantity: 2 of 12 : CPT4  Code: [id number] IC L Description: 97 - DEBRIDE WOUND 1ST 20 SQ CM OR < [name]-10 Diagnosis Description 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 Modifier: 1 Quantity: Physician Procedures : CPT4 Code Description Modifier [id number] 11042 - WC PHYS SUBQ TISS 20 SQ CM ICD-10 Diagnosis Description L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 Quantity: 1 : [id number] - WC PHYS DEBR WO ANESTH 20 SQ CM ICD-10 Diagnosis Description L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 Quantity: 1 Electronic Signature(s) Signed: 07/11/2023 8:30:34 AM By: Charles Derry PA-C Entered By: [name] on 07/11/2023 05:30:33  LANIE, [name] ([id number]) [id number] Page 1 of 12 Visit Report for 07/11/2023 Chief Complaint Document Details Patient Name: Date of Service: [name], [place] RD [name] 07/11/2023 7:45 A M Medical Record Number: [id number] Patient Account Number: [id number] Date of Birth/Sex: Treating RN: [birth year] (82 y.o. [name] Primary Care Provider: Debbra [name] Other Clinician: Referring Provider: Treating Provider/Extender: [name] in Treatment: 3 Information Obtained from: Patient Chief Complaint Right foot ulcers Electronic Signature(s) Signed: 07/11/2023 7:50:03 AM By: Charles Derry PA-C Entered By: [name] on 07/11/2023 04:50:03 -------------------------------------------------------------------------------- Debridement Details Patient Name: Date of Service: [name], HO [place] RD [name] 07/11/2023 7:45 A M Medical Record Number: [id number] Patient Account Number: [id number] Date of Birth/Sex: Treating RN: 03-28-1941 (82 y.o. 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 Performed By: Physician Charles Derry, PA-C The following information was scribed by: [name] The information was scribed for: [name] Debridement Type: Debridement Level of Consciousness (Pre-procedure): Awake and Alert Pre-procedure Verification/Time Out Yes - 08:17 Taken: Start Time: 08:17 Pain Control: Lidocaine 4% T opical Solution Percent of Wound Bed Debrided: 100% T Area Debrided (cm): otal 0.27 Tissue and other material debrided: Viable, Non-Viable, Slough, Subcutaneous, Slough Level: Skin/Subcutaneous Tissue Debridement Description: Excisional Instrument: Curette Bleeding: Minimum Hemostasis Achieved: Pressure [name] ([id number])  [id number] Page 2 of 12 Procedural Pain: 0 Post Procedural Pain: 0 Response to Treatment: Procedure was tolerated well Level of Consciousness (Post- Awake and Alert procedure): Post Debridement Measurements of Total Wound Length: (cm) 0.5 Width: (cm) 0.7 Depth: (cm) 0.7 Volume: (cm) 0.192 Character of Wound/Ulcer Post Debridement: Stable Post Procedure Diagnosis Same as Pre-procedure Electronic Signature(s) Signed: 07/11/2023 3:56:17 PM By: Charles Aver MSN RN CNS WTA Signed: 07/11/2023 6:30:03 PM By: Charles Derry PA-C Entered By: [name] on 07/11/2023 12:56:17 -------------------------------------------------------------------------------- Debridement Details Patient Name: Date of Service: [name], HO [place] RD [name] 07/11/2023 7:45 A M Medical Record Number: [id number] Patient Account Number: [id number] Date of Birth/Sex: Treating RN: 10-28-1940 (82 y.o. [name] Primary Care Provider: Debbra [name] Other Clinician: Referring Provider: Treating Provider/Extender: [name] in Treatment: 3 Debridement Performed for Assessment: Wound #2 Right,Plantar Foot Performed By: Physician Charles Derry, PA-C The following information was scribed by: [name] The information was scribed for: [name] Debridement Type: Debridement Severity of Tissue Pre Debridement: Fat layer exposed Level of Consciousness (Pre-procedure): Awake and Alert Pre-procedure Verification/Time Out Yes - 08:17 Taken: Start Time: 08:17 Pain Control: Lidocaine 4% T opical Solution Percent of Wound Bed Debrided: 100% T Area Debrided (cm): otal 0.55 Tissue and other material debrided: Viable, Non-Viable, Slough, Subcutaneous, Skin: Dermis , Skin: Epidermis, Slough Level: Skin/Subcutaneous Tissue Debridement Description: Excisional Instrument: Curette Bleeding: Minimum Hemostasis Achieved: Pressure Procedural Pain: 0 Post Procedural Pain:  0 Response to Treatment: Procedure was tolerated well Level of Consciousness (Post- Awake and Alert procedure): Post Debridement Measurements of Total Wound Length: (cm) 1 Width: (cm) 0.7 Depth: (cm) 0.1 Volume: (cm) 0.055 Character of Wound/Ulcer Post Debridement: Stable Severity of Tissue Post Debridement: Fat layer exposed SIMON, OHLINGER [name] ([id number]) [phone].pdf Page 3 of 12 Post Procedure Diagnosis Same as Pre-procedure Electronic Signature(s) Signed: 07/11/2023 3:58:30 PM By: Charles Aver MSN RN CNS WTA Signed: 07/11/2023 6:30:03 PM By: Charles Derry PA-C Entered By: [name] on 07/11/2023 12:58:30 -------------------------------------------------------------------------------- Debridement Details Patient Name: Date  to dressing wounds Prim Dressing: Silvercel Small 2x2 (in/in) (Dispense As Written) 3 x Per Week/30 Days ary Discharge Instructions: Apply Silvercel Small 2x2 (in/in) as instructed Secondary Dressing: (BORDER) Zetuvit Plus SILICONE BORDER Dressing 4x4 (in/in) (Generic) 3 x Per Week/30 Days Discharge Instructions: Please do not put silicone bordered dressings under wraps. Use non-bordered dressing only. Secured With: Medipore T - 55M Medipore H Soft Cloth Surgical T ape  ape, 2x2 (in/yd) (Generic) 3 x Per Week/30 Days Secured With: [employer] Sterile or Non-Sterile 6-ply 4.5x4 (yd/yd) (Generic) 3 x Per Week/30 Days Discharge Instructions: Apply Kerlix as directed Wound #3 - Foot Wound Laterality: Right, Lateral Cleanser: Soap and Water 3 x Per Week/30 Days Discharge Instructions: Gently cleanse wound with antibacterial soap, rinse and pat dry prior to dressing wounds Prim Dressing: Silvercel Small 2x2 (in/in) (Dispense As Written) 3 x Per Week/30 Days ary Discharge Instructions: Apply Silvercel Small 2x2 (in/in) as instructed Secondary Dressing: (BORDER) Zetuvit Plus SILICONE BORDER Dressing 4x4 (in/in) (Generic) 3 x Per Week/30 Days Discharge Instructions: Please do not put silicone bordered dressings under wraps. Use non-bordered dressing only. Secured With: Medipore T - 55M Medipore H Soft Cloth Surgical T ape ape, 2x2 (in/yd) (Generic) 3 x Per Week/30 Days Secured With: [employer] Sterile or Non-Sterile 6-ply 4.5x4 (yd/yd) (Generic) 3 x Per Week/30 Days Discharge Instructions: Apply Kerlix as directed Wound #4 - Foot Wound Laterality: Right, Medial Cleanser: Soap and Water 3 x Per Week/30 Days Discharge Instructions: Gently cleanse wound with antibacterial soap, rinse and pat dry prior to dressing wounds Prim Dressing: Silvercel Small 2x2 (in/in) 3 x Per Week/30 Days ary Discharge Instructions: Apply Silvercel Small 2x2 (in/in) as instructed Secondary Dressing: (BORDER) Zetuvit Plus SILICONE BORDER Dressing 5x5 (in/in) 3 x Per Week/30 Days Discharge Instructions: Please do not put silicone bordered dressings under wraps. Use non-bordered dressing only. Electronic Signature(s) Signed: 07/11/2023 4:52:24 PM By: Charles Aver MSN RN CNS WTA Signed: 07/11/2023 6:30:03 PM By: Charles Derry PA-C Entered By: [name] on 07/11/2023 05:23:59 -------------------------------------------------------------------------------- Problem List Details Patient Name:  Date of Service: [name], HO [place] RD [name] 07/11/2023 7:45 A M Medical Record Number: [id number] Patient Account Number: [id number] Date of Birth/Sex: Treating RN: 1940/10/26 (82 y.o. [name] Primary Care Provider: Debbra [name] Other Clinician: Referring Provider: Treating Provider/Extender: [name] in Treatment: [address] GADSDEN, CAEZ [name] ([id number]) [id number] Page 8 of 12 ICD-10 Encounter Code Description Active Date MDM Diagnosis T81.31XA Disruption of external operation (surgical) wound, not elsewhere classified, 06/20/2023 No Yes initial encounter E11.621 Type 2 diabetes mellitus with foot ulcer 06/20/2023 No Yes L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 06/20/2023 No Yes I10 Essential (primary) hypertension 06/20/2023 No Yes 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 06/20/2023 No Yes I25.10 Atherosclerotic heart disease of native coronary artery without angina pectoris 06/20/2023 No Yes Z79.01 Long term (current) use of anticoagulants 06/20/2023 No Yes 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 07/11/2023 7:45 A M Medical Record Number: [id number] Patient Account Number: [id number] Date of Birth/Sex: Treating RN: [birth year] (82 y.o. [name] Primary Care Provider: Debbra [name] Other Clinician: Referring Provider: Treating Provider/Extender: [name] in Treatment: 3 Subjective Chief Complaint Information obtained  to dressing wounds Prim Dressing: Silvercel Small 2x2 (in/in) (Dispense As Written) 3 x Per Week/30 Days ary Discharge Instructions: Apply Silvercel Small 2x2 (in/in) as instructed Secondary Dressing: (BORDER) Zetuvit Plus SILICONE BORDER Dressing 4x4 (in/in) (Generic) 3 x Per Week/30 Days Discharge Instructions: Please do not put silicone bordered dressings under wraps. Use non-bordered dressing only. Secured With: Medipore T - 55M Medipore H Soft Cloth Surgical T ape  ape, 2x2 (in/yd) (Generic) 3 x Per Week/30 Days Secured With: [employer] Sterile or Non-Sterile 6-ply 4.5x4 (yd/yd) (Generic) 3 x Per Week/30 Days Discharge Instructions: Apply Kerlix as directed Wound #3 - Foot Wound Laterality: Right, Lateral Cleanser: Soap and Water 3 x Per Week/30 Days Discharge Instructions: Gently cleanse wound with antibacterial soap, rinse and pat dry prior to dressing wounds Prim Dressing: Silvercel Small 2x2 (in/in) (Dispense As Written) 3 x Per Week/30 Days ary Discharge Instructions: Apply Silvercel Small 2x2 (in/in) as instructed Secondary Dressing: (BORDER) Zetuvit Plus SILICONE BORDER Dressing 4x4 (in/in) (Generic) 3 x Per Week/30 Days Discharge Instructions: Please do not put silicone bordered dressings under wraps. Use non-bordered dressing only. Secured With: Medipore T - 55M Medipore H Soft Cloth Surgical T ape ape, 2x2 (in/yd) (Generic) 3 x Per Week/30 Days Secured With: [employer] Sterile or Non-Sterile 6-ply 4.5x4 (yd/yd) (Generic) 3 x Per Week/30 Days Discharge Instructions: Apply Kerlix as directed Wound #4 - Foot Wound Laterality: Right, Medial Cleanser: Soap and Water 3 x Per Week/30 Days Discharge Instructions: Gently cleanse wound with antibacterial soap, rinse and pat dry prior to dressing wounds Prim Dressing: Silvercel Small 2x2 (in/in) 3 x Per Week/30 Days ary Discharge Instructions: Apply Silvercel Small 2x2 (in/in) as instructed Secondary Dressing: (BORDER) Zetuvit Plus SILICONE BORDER Dressing 5x5 (in/in) 3 x Per Week/30 Days Discharge Instructions: Please do not put silicone bordered dressings under wraps. Use non-bordered dressing only. Electronic Signature(s) Signed: 07/11/2023 4:52:24 PM By: Charles Aver MSN RN CNS WTA Signed: 07/11/2023 6:30:03 PM By: Charles Derry PA-C Entered By: [name] on 07/11/2023 05:23:59 -------------------------------------------------------------------------------- Problem List Details Patient Name:  Date of Service: [name], HO [place] RD [name] 07/11/2023 7:45 A M Medical Record Number: [id number] Patient Account Number: [id number] Date of Birth/Sex: Treating RN: 1940/10/26 (82 y.o. [name] Primary Care Provider: Debbra [name] Other Clinician: Referring Provider: Treating Provider/Extender: [name] in Treatment: [address] GADSDEN, CAEZ [name] ([id number]) [id number] Page 8 of 12 ICD-10 Encounter Code Description Active Date MDM Diagnosis T81.31XA Disruption of external operation (surgical) wound, not elsewhere classified, 06/20/2023 No Yes initial encounter E11.621 Type 2 diabetes mellitus with foot ulcer 06/20/2023 No Yes L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 06/20/2023 No Yes I10 Essential (primary) hypertension 06/20/2023 No Yes 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 06/20/2023 No Yes I25.10 Atherosclerotic heart disease of native coronary artery without angina pectoris 06/20/2023 No Yes Z79.01 Long term (current) use of anticoagulants 06/20/2023 No Yes 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 07/11/2023 7:45 A M Medical Record Number: [id number] Patient Account Number: [id number] Date of Birth/Sex: Treating RN: [birth year] (82 y.o. [name] Primary Care Provider: Debbra [name] Other Clinician: Referring Provider: Treating Provider/Extender: [name] in Treatment: 3 Subjective Chief Complaint Information obtained  well the worst being the right lateral foot but even that seems better with the Betadine that we switch to last week. Electronic Signature(s) Signed: 07/11/2023 8:28:44 AM By: Charles Derry PA-C Entered By: [name] on 07/11/2023 05:28:44 -------------------------------------------------------------------------------- Physical Exam Details Patient Name: Date of Service: [name], [name] 07/11/2023 7:45 A M Medical Record Number: [id number] Patient Account Number: [id number] Date of Birth/Sex: Treating RN: 04-09-1941 (82 y.o. [name] Primary Care Provider: Debbra [name] Other Clinician: Referring Provider: Treating Provider/Extender: [name] in Treatment: 3 Constitutional Well-nourished and well-hydrated in no acute distress. Respiratory normal breathing without  difficulty. Psychiatric KAYAAN, THROOP ([id number]) [id number] Page 6 of 12 this patient is able to make decisions and demonstrates good insight into disease process. Alert and Oriented x 3. pleasant and cooperative. Notes Upon inspection patient's wound bed across the board appears to be improved. The actual surgical site at the amputation of the second toe is looking to be doing much better and I do not even palpate or probe any bone in the base of the wound at this point. I did perform debridement at multiple locations today with good result postdebridement all areas look better including the amputation site which is the best of seeing it thus far. Electronic Signature(s) Signed: 07/11/2023 8:29:10 AM By: Charles Derry PA-C Entered By: [name] on 07/11/2023 05:29:10 -------------------------------------------------------------------------------- Physician Orders Details Patient Name: Date of Service: [name], [name] 07/11/2023 7:45 A M Medical Record Number: [id number] Patient Account Number: [id number] Date of Birth/Sex: Treating RN: 1941-09-09 (82 y.o. [name] Primary Care Provider: Debbra [name] Other Clinician: Referring Provider: Treating Provider/Extender: [name] in Treatment: 3 The following information was scribed by: [name] The information was scribed for: [name] Verbal / Phone Orders: No Diagnosis Coding ICD-10 Coding Code Description T81.31XA Disruption of external operation (surgical) wound, not elsewhere classified, initial encounter E11.621 Type 2 diabetes mellitus with foot ulcer L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 I10 Essential (primary) hypertension 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 Bathing/ [employer] wounds with antibacterial soap and water. May shower; gently cleanse wound with antibacterial soap, rinse and pat dry prior to dressing wounds Anesthetic (Use 'Patient Medications' Section for Anesthetic Order Entry) Lidocaine applied to wound bed Wound Treatment Wound #1 - Amputation Site - Toe Wound Laterality: Right Cleanser: [employer] Ancillary Kit - 15 Day Supply (Generic) 1 x Per Day/30 Days Discharge Instructions: Use supplies as instructed; Kit contains: (15) Saline Bullets; (15) 3x3 Gauze; 15 pr Gloves Cleanser: Dakin 16 (oz) 0.25 1 x Per Day/30 Days Discharge Instructions: Use as directed. Prim Dressing: Gauze (Generic) 1 x Per Day/30 Days ary Discharge Instructions: As directed: dry, moistened with saline or moistened with Dakins Solution Secondary Dressing: Conforming Guaze Roll-Large (Generic) 1 x Per Day/30 Days Discharge Instructions: Apply Conforming Stretch Guaze Bandage as directed CHASITY, BLUE ([id number]) [phone].pdf Page 7 of 12 Secured With: Medipore T - 48M Medipore H Soft Cloth Surgical T ape ape, 2x2 (in/yd) (Generic) 1 x Per Day/30 Days Wound #2 - Foot Wound Laterality: Plantar, Right Cleanser: Soap and Water 3 x Per Week/30 Days Discharge Instructions: Gently cleanse wound with antibacterial soap, rinse and pat dry prior  of Service: [name], [name] 07/11/2023 7:45 A M Medical Record Number: [id number] Patient Account Number: [id number] Date of Birth/Sex: Treating RN: 05-Aug-1941 (82 y.o. [name] Primary Care Provider: Debbra [name] Other Clinician: Referring Provider: Treating Provider/Extender: [name] in Treatment: 3 Debridement Performed for Assessment: Wound #3 Right,Lateral Foot Performed By: Physician Charles Derry, PA-C The following information was scribed by: [name] The information was scribed for: [name] Debridement Type: Debridement Severity of Tissue Pre Debridement: Fat layer exposed Level of Consciousness (Pre-procedure): Awake and Alert Pre-procedure Verification/Time Out Yes - 08:17 Taken: Start Time: 08:17 Pain Control: Lidocaine 4% T opical Solution Percent of Wound Bed Debrided: 100% T Area Debrided (cm): otal 0.94 Tissue and other material debrided: Viable, Non-Viable, Skin: Dermis , Skin: Epidermis Level: Skin/Epidermis Debridement Description: Selective/Open Wound Instrument: Curette Bleeding: Minimum Hemostasis Achieved: Pressure Procedural Pain: 0 Post Procedural  Pain: 0 Response to Treatment: Procedure was tolerated well Level of Consciousness (Post- Awake and Alert procedure): Post Debridement Measurements of Total Wound Length: (cm) 1.2 Width: (cm) 1 Depth: (cm) 0.1 Volume: (cm) 0.094 Character of Wound/Ulcer Post Debridement: Stable Severity of Tissue Post Debridement: Fat layer exposed Post Procedure Diagnosis Same as Pre-procedure Electronic Signature(s) Signed: 07/11/2023 3:58:47 PM By: Charles Aver MSN RN CNS WTA Signed: 07/11/2023 6:30:03 PM By: Charles Derry PA-C Entered By: [name] on 07/11/2023 12:58:47 Peter [place] [name] ([id number]) [id number] Page 4 of 12 -------------------------------------------------------------------------------- Debridement Details Patient Name: Date of Service: [name], [place] RD [name] 07/11/2023 7:45 A M Medical Record Number: [id number] Patient Account Number: [id number] Date of Birth/Sex: Treating RN: 1941/08/24 (82 y.o. [name] Primary Care Provider: Debbra [name] Other Clinician: Referring Provider: Treating Provider/Extender: [name] in Treatment: 3 Debridement Performed for Assessment: Wound #4 Right,Medial Foot Performed By: Physician Charles Derry, PA-C The following information was scribed by: [name] The information was scribed for: [name] Debridement Type: Debridement Severity of Tissue Pre Debridement: Fat layer exposed Level of Consciousness (Pre-procedure): Awake and Alert Pre-procedure Verification/Time Out Yes - 08:17 Taken: Start Time: 08:17 Pain Control: Lidocaine 4% T opical Solution Percent of Wound Bed Debrided: 100% T Area Debrided (cm): otal 0.01 Tissue and other material debrided: Viable, Non-Viable, Slough, Subcutaneous, Skin: Dermis , Skin: Epidermis, Slough Level: Skin/Subcutaneous Tissue Debridement Description: Excisional Instrument: Curette Bleeding: Minimum Hemostasis Achieved:  Pressure Procedural Pain: 0 Post Procedural Pain: 0 Response to Treatment: Procedure was tolerated well Level of Consciousness (Post- Awake and Alert procedure): Post Debridement Measurements of Total Wound Length: (cm) 0.1 Width: (cm) 0.1 Depth: (cm) 0.1 Volume: (cm) 0.001 Character of Wound/Ulcer Post Debridement: Stable Severity of Tissue Post Debridement: Fat layer exposed Post Procedure Diagnosis Same as Pre-procedure Electronic Signature(s) Signed: 07/11/2023 3:59:20 PM By: Charles Aver MSN RN CNS WTA Signed: 07/11/2023 6:30:03 PM By: Charles Derry PA-C Entered By: [name] on 07/11/2023 12:59:20 Peter Congo [name] ([id number]) [id number] Page 5 of 12 -------------------------------------------------------------------------------- HPI Details Patient Name: Date of Service: [name], [name] 07/11/2023 7:45 A M Medical Record Number: [id number] Patient Account Number: [id number] Date of Birth/Sex: Treating RN: [birth year] (82 y.o. [name] Primary Care Provider: Debbra [name] Other Clinician: Referring Provider: Treating Provider/Extender: [name] in Treatment: 3 History of Present Illness Chronic/Inactive Conditions Condition 1: Notes from Vascular 05/21/23: Right below-the-knee popliteal artery to peroneal artery bypass with reverse saphenous vein graft. This was done on 04/11/2023. Noninvasive studies in our an ABI 0.61  LANIE, [name] ([id number]) [id number] Page 1 of 12 Visit Report for 07/11/2023 Chief Complaint Document Details Patient Name: Date of Service: [name], [place] RD [name] 07/11/2023 7:45 A M Medical Record Number: [id number] Patient Account Number: [id number] Date of Birth/Sex: Treating RN: [birth year] (82 y.o. [name] Primary Care Provider: Debbra [name] Other Clinician: Referring Provider: Treating Provider/Extender: [name] in Treatment: 3 Information Obtained from: Patient Chief Complaint Right foot ulcers Electronic Signature(s) Signed: 07/11/2023 7:50:03 AM By: Charles Derry PA-C Entered By: [name] on 07/11/2023 04:50:03 -------------------------------------------------------------------------------- Debridement Details Patient Name: Date of Service: [name], [place] RD [name] 07/11/2023 7:45 A M Medical Record Number: [id number] Patient Account Number: [id number] Date of Birth/Sex: Treating RN: 03-28-1941 (82 y.o. 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 Performed By: Physician Charles Derry, PA-C The following information was scribed by: [name] The information was scribed for: [name] Debridement Type: Debridement Level of Consciousness (Pre-procedure): Awake and Alert Pre-procedure Verification/Time Out Yes - 08:17 Taken: Start Time: 08:17 Pain Control: Lidocaine 4% T opical Solution Percent of Wound Bed Debrided: 100% T Area Debrided (cm): otal 0.27 Tissue and other material debrided: Viable, Non-Viable, Slough, Subcutaneous, Slough Level: Skin/Subcutaneous Tissue Debridement Description: Excisional Instrument: Curette Bleeding: Minimum Hemostasis Achieved: Pressure [name] ([id number])  [id number] Page 2 of 12 Procedural Pain: 0 Post Procedural Pain: 0 Response to Treatment: Procedure was tolerated well Level of Consciousness (Post- Awake and Alert procedure): Post Debridement Measurements of Total Wound Length: (cm) 0.5 Width: (cm) 0.7 Depth: (cm) 0.7 Volume: (cm) 0.192 Character of Wound/Ulcer Post Debridement: Stable Post Procedure Diagnosis Same as Pre-procedure Electronic Signature(s) Signed: 07/11/2023 3:56:17 PM By: Charles Aver MSN RN CNS WTA Signed: 07/11/2023 6:30:03 PM By: Charles Derry PA-C Entered By: [name] on 07/11/2023 12:56:17 -------------------------------------------------------------------------------- Debridement Details Patient Name: Date of Service: [name], HO [place] RD [name] 07/11/2023 7:45 A M Medical Record Number: [id number] Patient Account Number: [id number] Date of Birth/Sex: Treating RN: 10-28-1940 (82 y.o. [name] Primary Care Provider: Debbra [name] Other Clinician: Referring Provider: Treating Provider/Extender: [name] in Treatment: 3 Debridement Performed for Assessment: Wound #2 Right,Plantar Foot Performed By: Physician Charles Derry, PA-C The following information was scribed by: [name] The information was scribed for: [name] Debridement Type: Debridement Severity of Tissue Pre Debridement: Fat layer exposed Level of Consciousness (Pre-procedure): Awake and Alert Pre-procedure Verification/Time Out Yes - 08:17 Taken: Start Time: 08:17 Pain Control: Lidocaine 4% T opical Solution Percent of Wound Bed Debrided: 100% T Area Debrided (cm): otal 0.55 Tissue and other material debrided: Viable, Non-Viable, Slough, Subcutaneous, Skin: Dermis , Skin: Epidermis, Slough Level: Skin/Subcutaneous Tissue Debridement Description: Excisional Instrument: Curette Bleeding: Minimum Hemostasis Achieved: Pressure Procedural Pain: 0 Post Procedural Pain:  0 Response to Treatment: Procedure was tolerated well Level of Consciousness (Post- Awake and Alert procedure): Post Debridement Measurements of Total Wound Length: (cm) 1 Width: (cm) 0.7 Depth: (cm) 0.1 Volume: (cm) 0.055 Character of Wound/Ulcer Post Debridement: Stable Severity of Tissue Post Debridement: Fat layer exposed SIMON, OHLINGER [name] ([id number]) [phone] Page 3 of 12 Post Procedure Diagnosis Same as Pre-procedure Electronic Signature(s) Signed: 07/11/2023 3:58:30 PM By: Charles Aver MSN RN CNS WTA Signed: 07/11/2023 6:30:03 PM By: Charles Derry PA-C Entered By: [name] on 07/11/2023 12:58:30 -------------------------------------------------------------------------------- Debridement Details Patient Name: Date  to dressing wounds Prim Dressing: Silvercel Small 2x2 (in/in) (Dispense As Written) 3 x Per Week/30 Days ary Discharge Instructions: Apply Silvercel Small 2x2 (in/in) as instructed Secondary Dressing: (BORDER) Zetuvit Plus SILICONE BORDER Dressing 4x4 (in/in) (Generic) 3 x Per Week/30 Days Discharge Instructions: Please do not put silicone bordered dressings under wraps. Use non-bordered dressing only. Secured With: Medipore T - 55M Medipore H Soft Cloth Surgical T ape  ape, 2x2 (in/yd) (Generic) 3 x Per Week/30 Days Secured With: [employer] Sterile or Non-Sterile 6-ply 4.5x4 (yd/yd) (Generic) 3 x Per Week/30 Days Discharge Instructions: Apply Kerlix as directed Wound #3 - Foot Wound Laterality: Right, Lateral Cleanser: Soap and Water 3 x Per Week/30 Days Discharge Instructions: Gently cleanse wound with antibacterial soap, rinse and pat dry prior to dressing wounds Prim Dressing: Silvercel Small 2x2 (in/in) (Dispense As Written) 3 x Per Week/30 Days ary Discharge Instructions: Apply Silvercel Small 2x2 (in/in) as instructed Secondary Dressing: (BORDER) Zetuvit Plus SILICONE BORDER Dressing 4x4 (in/in) (Generic) 3 x Per Week/30 Days Discharge Instructions: Please do not put silicone bordered dressings under wraps. Use non-bordered dressing only. Secured With: Medipore T - 55M Medipore H Soft Cloth Surgical T ape ape, 2x2 (in/yd) (Generic) 3 x Per Week/30 Days Secured With: [employer] Sterile or Non-Sterile 6-ply 4.5x4 (yd/yd) (Generic) 3 x Per Week/30 Days Discharge Instructions: Apply Kerlix as directed Wound #4 - Foot Wound Laterality: Right, Medial Cleanser: Soap and Water 3 x Per Week/30 Days Discharge Instructions: Gently cleanse wound with antibacterial soap, rinse and pat dry prior to dressing wounds Prim Dressing: Silvercel Small 2x2 (in/in) 3 x Per Week/30 Days ary Discharge Instructions: Apply Silvercel Small 2x2 (in/in) as instructed Secondary Dressing: (BORDER) Zetuvit Plus SILICONE BORDER Dressing 5x5 (in/in) 3 x Per Week/30 Days Discharge Instructions: Please do not put silicone bordered dressings under wraps. Use non-bordered dressing only. Electronic Signature(s) Signed: 07/11/2023 4:52:24 PM By: Charles Aver MSN RN CNS WTA Signed: 07/11/2023 6:30:03 PM By: Charles Derry PA-C Entered By: [name] on 07/11/2023 05:23:59 -------------------------------------------------------------------------------- Problem List Details Patient Name:  Date of Service: [name], [place] RD [name] 07/11/2023 7:45 A M Medical Record Number: [id number] Patient Account Number: [id number] Date of Birth/Sex: Treating RN: 1940/10/26 (82 y.o. [name] Primary Care Provider: Debbra [name] Other Clinician: Referring Provider: Treating Provider/Extender: [name] in Treatment: [address] GADSDEN, CAEZ [name] ([id number]) [id number] Page 8 of 12 ICD-10 Encounter Code Description Active Date MDM Diagnosis T81.31XA Disruption of external operation (surgical) wound, not elsewhere classified, 06/20/2023 No Yes initial encounter E11.621 Type 2 diabetes mellitus with foot ulcer 06/20/2023 No Yes L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 06/20/2023 No Yes I10 Essential (primary) hypertension 06/20/2023 No Yes 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 06/20/2023 No Yes I25.10 Atherosclerotic heart disease of native coronary artery without angina pectoris 06/20/2023 No Yes Z79.01 Long term (current) use of anticoagulants 06/20/2023 No Yes 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 07/11/2023 7:45 A M Medical Record Number: [id number] Patient Account Number: [id number] Date of Birth/Sex: Treating RN: [birth year] (82 y.o. [name] Primary Care Provider: Debbra [name] Other Clinician: Referring Provider: Treating Provider/Extender: [name] in Treatment: 3 Subjective Chief Complaint Information obtained  well the worst being the right lateral foot but even that seems better with the Betadine that we switch to last week. Electronic Signature(s) Signed: 07/11/2023 8:28:44 AM By: Charles Derry PA-C Entered By: [name] on 07/11/2023 05:28:44 -------------------------------------------------------------------------------- Physical Exam Details Patient Name: Date of Service: [name], [name] 07/11/2023 7:45 A M Medical Record Number: [id number] Patient Account Number: [id number] Date of Birth/Sex: Treating RN: 04-09-1941 (82 y.o. [name] Primary Care Provider: Debbra [name] Other Clinician: Referring Provider: Treating Provider/Extender: [name] in Treatment: 3 Constitutional Well-nourished and well-hydrated in no acute distress. Respiratory normal breathing without  difficulty. Psychiatric KAYAAN, THROOP ([id number]) [id number] Page 6 of 12 this patient is able to make decisions and demonstrates good insight into disease process. Alert and Oriented x 3. pleasant and cooperative. Notes Upon inspection patient's wound bed across the board appears to be improved. The actual surgical site at the amputation of the second toe is looking to be doing much better and I do not even palpate or probe any bone in the base of the wound at this point. I did perform debridement at multiple locations today with good result postdebridement all areas look better including the amputation site which is the best of seeing it thus far. Electronic Signature(s) Signed: 07/11/2023 8:29:10 AM By: Charles Derry PA-C Entered By: [name] on 07/11/2023 05:29:10 -------------------------------------------------------------------------------- Physician Orders Details Patient Name: Date of Service: [name], [name] 07/11/2023 7:45 A M Medical Record Number: [id number] Patient Account Number: [id number] Date of Birth/Sex: Treating RN: 1941-09-09 (82 y.o. [name] Primary Care Provider: Debbra [name] Other Clinician: Referring Provider: Treating Provider/Extender: [name] in Treatment: 3 The following information was scribed by: [name] The information was scribed for: [name] Verbal / Phone Orders: No Diagnosis Coding ICD-10 Coding Code Description T81.31XA Disruption of external operation (surgical) wound, not elsewhere classified, initial encounter E11.621 Type 2 diabetes mellitus with foot ulcer L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 I10 Essential (primary) hypertension 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 Bathing/ [employer] wounds with antibacterial soap and water. May shower; gently cleanse wound with antibacterial soap, rinse and pat dry prior to dressing wounds Anesthetic (Use 'Patient Medications' Section for Anesthetic Order Entry) Lidocaine applied to wound bed Wound Treatment Wound #1 - Amputation Site - Toe Wound Laterality: Right Cleanser: Byram Ancillary Kit - 15 Day Supply (Generic) 1 x Per Day/30 Days Discharge Instructions: Use supplies as instructed; Kit contains: (15) Saline Bullets; (15) 3x3 Gauze; 15 pr Gloves Cleanser: Dakin 16 (oz) 0.25 1 x Per Day/30 Days Discharge Instructions: Use as directed. Prim Dressing: Gauze (Generic) 1 x Per Day/30 Days ary Discharge Instructions: As directed: dry, moistened with saline or moistened with Dakins Solution Secondary Dressing: Conforming Guaze Roll-Large (Generic) 1 x Per Day/30 Days Discharge Instructions: Apply Conforming Stretch Guaze Bandage as directed CHASITY, BLUE ([id number]) [phone].pdf Page 7 of 12 Secured With: Medipore T - 48M Medipore H Soft Cloth Surgical T ape ape, 2x2 (in/yd) (Generic) 1 x Per Day/30 Days Wound #2 - Foot Wound Laterality: Plantar, Right Cleanser: Soap and Water 3 x Per Week/30 Days Discharge Instructions: Gently cleanse wound with antibacterial soap, rinse and pat dry prior  well the worst being the right lateral foot but even that seems better with the Betadine that we switch to last week. Electronic Signature(s) Signed: 07/11/2023 8:28:44 AM By: Charles Derry PA-C Entered By: [name] on 07/11/2023 05:28:44 -------------------------------------------------------------------------------- Physical Exam Details Patient Name: Date of Service: [name], [name] 07/11/2023 7:45 A M Medical Record Number: [id number] Patient Account Number: [id number] Date of Birth/Sex: Treating RN: 04-09-1941 (82 y.o. [name] Primary Care Provider: Debbra [name] Other Clinician: Referring Provider: Treating Provider/Extender: [name] in Treatment: 3 Constitutional Well-nourished and well-hydrated in no acute distress. Respiratory normal breathing without  difficulty. Psychiatric KAYAAN, THROOP ([id number]) [id number] Page 6 of 12 this patient is able to make decisions and demonstrates good insight into disease process. Alert and Oriented x 3. pleasant and cooperative. Notes Upon inspection patient's wound bed across the board appears to be improved. The actual surgical site at the amputation of the second toe is looking to be doing much better and I do not even palpate or probe any bone in the base of the wound at this point. I did perform debridement at multiple locations today with good result postdebridement all areas look better including the amputation site which is the best of seeing it thus far. Electronic Signature(s) Signed: 07/11/2023 8:29:10 AM By: Charles Derry PA-C Entered By: [name] on 07/11/2023 05:29:10 -------------------------------------------------------------------------------- Physician Orders Details Patient Name: Date of Service: [name], [name] 07/11/2023 7:45 A M Medical Record Number: [id number] Patient Account Number: [id number] Date of Birth/Sex: Treating RN: 1941-09-09 (82 y.o. [name] Primary Care Provider: Debbra [name] Other Clinician: Referring Provider: Treating Provider/Extender: [name] in Treatment: 3 The following information was scribed by: [name] The information was scribed for: [name] Verbal / Phone Orders: No Diagnosis Coding ICD-10 Coding Code Description T81.31XA Disruption of external operation (surgical) wound, not elsewhere classified, initial encounter E11.621 Type 2 diabetes mellitus with foot ulcer L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 I10 Essential (primary) hypertension 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 Bathing/ [employer] wounds with antibacterial soap and water. May shower; gently cleanse wound with antibacterial soap, rinse and pat dry prior to dressing wounds Anesthetic (Use 'Patient Medications' Section for Anesthetic Order Entry) Lidocaine applied to wound bed Wound Treatment Wound #1 - Amputation Site - Toe Wound Laterality: Right Cleanser: Byram Ancillary Kit - 15 Day Supply (Generic) 1 x Per Day/30 Days Discharge Instructions: Use supplies as instructed; Kit contains: (15) Saline Bullets; (15) 3x3 Gauze; 15 pr Gloves Cleanser: Dakin 16 (oz) 0.25 1 x Per Day/30 Days Discharge Instructions: Use as directed. Prim Dressing: Gauze (Generic) 1 x Per Day/30 Days ary Discharge Instructions: As directed: dry, moistened with saline or moistened with Dakins Solution Secondary Dressing: Conforming Guaze Roll-Large (Generic) 1 x Per Day/30 Days Discharge Instructions: Apply Conforming Stretch Guaze Bandage as directed CHASITY, BLUE ([id number]) [phone].pdf Page 7 of 12 Secured With: Medipore T - 48M Medipore H Soft Cloth Surgical T ape ape, 2x2 (in/yd) (Generic) 1 x Per Day/30 Days Wound #2 - Foot Wound Laterality: Plantar, Right Cleanser: Soap and Water 3 x Per Week/30 Days Discharge Instructions: Gently cleanse wound with antibacterial soap, rinse and pat dry prior

## 2023-07-11 NOTE — Progress Notes (Addendum)
correctly Electronic Signature(s) Signed: 07/11/2023 4:52:24 PM By: Midge Aver MSN RN CNS WTA Entered By: Midge Aver on 07/11/2023 05:45:43 -------------------------------------------------------------------------------- Wound Assessment Details Patient Name: Date of Service: [name], HO [place] RD [name] 07/11/2023 7:45 A M Medical Record Number: [id number] Patient  Account Number: [id number] Date of Birth/Sex: Treating RN: [birth year] (82 y.o. 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 Wound Open Wounding Event: Surgical Injury Status: Date Acquired: 04/16/2023 Comorbid Congestive Heart Failure, Hypertension, Peripheral Venous Weeks Of Treatment: 3 History: Disease, Type II Diabetes, Neuropathy Clustered Wound: No Photos Wound Measurements Length: (cm) 0.5 Width: (cm) 0.7 Depth: (cm) 0.7 Area: (cm) 0.275 Volume: (cm) 0.192 [name], [name] ([id number]) Wound Description Classification: Full Thickness Without Exposed Support Structures Exudate Amount: Medium Exudate Type: Serosanguineous Exudate Color: red, brown Foul Odor After Cleansing: No Slough/Fibrino No % Reduction in Area: 76.7% % Reduction in Volume: 87.5% Epithelialization: None [id number] Page 8 of 13 Wound Bed Granulation Amount: Medium (34-66%) Exposed Structure Granulation Quality: Pink Fascia Exposed: No Fat Layer (Subcutaneous Tissue) Exposed: Yes Tendon Exposed: No Muscle Exposed: No Joint Exposed: No Bone Exposed: No Treatment Notes Wound #1 (Amputation Site - Toe) Wound Laterality: Right Cleanser Byram Ancillary Kit - 15 Day Supply Discharge Instruction: Use supplies as instructed; Kit contains: (15) Saline Bullets; (15) 3x3 Gauze; 15 pr Gloves Dakin 16 (oz) 0.25 Discharge Instruction: Use as directed. Peri-Wound Care Topical Primary Dressing Gauze Discharge Instruction: As directed: dry, moistened with saline or moistened with Dakins Solution Secondary Dressing Conforming Guaze Roll-Large Discharge Instruction: Apply Conforming Stretch Guaze Bandage as directed Secured With Medipore T - 51M  Medipore H Soft Cloth Surgical T ape ape, 2x2 (in/yd) Compression Wrap Compression Stockings Add-Ons Electronic Signature(s) Signed: 07/11/2023 4:52:24 PM By: Midge Aver MSN RN CNS WTA Entered By: Midge Aver on 07/11/2023 05:07:37 -------------------------------------------------------------------------------- Wound Assessment Details Patient Name: Date of Service: [name], HO [place] RD [name] 07/11/2023 7:45 A M Medical Record Number: [id number] Patient Account Number: [id number] Date of Birth/Sex: Treating RN: [birth year] (82 y.o. [name] Primary Care Raegan Sipp: Debbra Riding Other Clinician: Referring Oliwia Berzins: Treating Shalie Schremp/Extender: Jasmine Pang in Treatment: 3 Wound Status Wound Number: 2 Primary Diabetic Wound/Ulcer of the Lower Extremity Etiology: [name], [name] ([id number]) [id number] Page 9 of 13 Etiology: Wound Location: Right, Plantar Foot Wound Open Wounding Event: Footwear Injury Status: Date Acquired: 06/16/2023 Comorbid Congestive Heart Failure, Hypertension, Peripheral Venous Weeks Of Treatment: 3 History: Disease, Type II Diabetes, Neuropathy Clustered Wound: No Photos Wound Measurements Length: (cm) 1 Width: (cm) 0.7 Depth: (cm) 0.1 Area: (cm) 0.55 Volume: (cm) 0.055 % Reduction in Area: -42.9% % Reduction in Volume: -44.7% Epithelialization: Small (1-33%) Wound Description Classification: Grade 1 Exudate Amount: Medium Exudate Type: Serosanguineous Exudate Color: red, brown Foul Odor After Cleansing: No Slough/Fibrino No Wound Bed Granulation Amount: Medium (34-66%) Exposed Structure Granulation Quality: Red, Pink Fascia Exposed: No Necrotic Amount: None Present (0%) Fat Layer (Subcutaneous Tissue) Exposed: No Tendon Exposed: No Muscle Exposed: No Joint Exposed: No Bone Exposed: No Treatment Notes Wound #2 (Foot) Wound Laterality: Plantar, Right Cleanser Soap and  Water Discharge Instruction: Gently cleanse wound with antibacterial soap, rinse and pat dry prior to dressing wounds Peri-Wound Care Topical Primary Dressing Silvercel Small 2x2 (in/in) Discharge Instruction: Apply Silvercel Small 2x2 (in/in) as instructed Secondary Dressing (BORDER) Zetuvit  correctly Electronic Signature(s) Signed: 07/11/2023 4:52:24 PM By: Midge Aver MSN RN CNS WTA Entered By: Midge Aver on 07/11/2023 05:45:43 -------------------------------------------------------------------------------- Wound Assessment Details Patient Name: Date of Service: [name], HO [place] RD [name] 07/11/2023 7:45 A M Medical Record Number: [id number] Patient  Account Number: [id number] Date of Birth/Sex: Treating RN: [birth year] (82 y.o. 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 Wound Open Wounding Event: Surgical Injury Status: Date Acquired: 04/16/2023 Comorbid Congestive Heart Failure, Hypertension, Peripheral Venous Weeks Of Treatment: 3 History: Disease, Type II Diabetes, Neuropathy Clustered Wound: No Photos Wound Measurements Length: (cm) 0.5 Width: (cm) 0.7 Depth: (cm) 0.7 Area: (cm) 0.275 Volume: (cm) 0.192 [name], [name] ([id number]) Wound Description Classification: Full Thickness Without Exposed Support Structures Exudate Amount: Medium Exudate Type: Serosanguineous Exudate Color: red, brown Foul Odor After Cleansing: No Slough/Fibrino No % Reduction in Area: 76.7% % Reduction in Volume: 87.5% Epithelialization: None [id number] Page 8 of 13 Wound Bed Granulation Amount: Medium (34-66%) Exposed Structure Granulation Quality: Pink Fascia Exposed: No Fat Layer (Subcutaneous Tissue) Exposed: Yes Tendon Exposed: No Muscle Exposed: No Joint Exposed: No Bone Exposed: No Treatment Notes Wound #1 (Amputation Site - Toe) Wound Laterality: Right Cleanser Byram Ancillary Kit - 15 Day Supply Discharge Instruction: Use supplies as instructed; Kit contains: (15) Saline Bullets; (15) 3x3 Gauze; 15 pr Gloves Dakin 16 (oz) 0.25 Discharge Instruction: Use as directed. Peri-Wound Care Topical Primary Dressing Gauze Discharge Instruction: As directed: dry, moistened with saline or moistened with Dakins Solution Secondary Dressing Conforming Guaze Roll-Large Discharge Instruction: Apply Conforming Stretch Guaze Bandage as directed Secured With Medipore T - 51M  Medipore H Soft Cloth Surgical T ape ape, 2x2 (in/yd) Compression Wrap Compression Stockings Add-Ons Electronic Signature(s) Signed: 07/11/2023 4:52:24 PM By: Midge Aver MSN RN CNS WTA Entered By: Midge Aver on 07/11/2023 05:07:37 -------------------------------------------------------------------------------- Wound Assessment Details Patient Name: Date of Service: [name], HO [place] RD [name] 07/11/2023 7:45 A M Medical Record Number: [id number] Patient Account Number: [id number] Date of Birth/Sex: Treating RN: [birth year] (82 y.o. [name] Primary Care Raegan Sipp: Debbra Riding Other Clinician: Referring Oliwia Berzins: Treating Shalie Schremp/Extender: Jasmine Pang in Treatment: 3 Wound Status Wound Number: 2 Primary Diabetic Wound/Ulcer of the Lower Extremity Etiology: [name], [name] ([id number]) [id number].pdf Page 9 of 13 Etiology: Wound Location: Right, Plantar Foot Wound Open Wounding Event: Footwear Injury Status: Date Acquired: 06/16/2023 Comorbid Congestive Heart Failure, Hypertension, Peripheral Venous Weeks Of Treatment: 3 History: Disease, Type II Diabetes, Neuropathy Clustered Wound: No Photos Wound Measurements Length: (cm) 1 Width: (cm) 0.7 Depth: (cm) 0.1 Area: (cm) 0.55 Volume: (cm) 0.055 % Reduction in Area: -42.9% % Reduction in Volume: -44.7% Epithelialization: Small (1-33%) Wound Description Classification: Grade 1 Exudate Amount: Medium Exudate Type: Serosanguineous Exudate Color: red, brown Foul Odor After Cleansing: No Slough/Fibrino No Wound Bed Granulation Amount: Medium (34-66%) Exposed Structure Granulation Quality: Red, Pink Fascia Exposed: No Necrotic Amount: None Present (0%) Fat Layer (Subcutaneous Tissue) Exposed: No Tendon Exposed: No Muscle Exposed: No Joint Exposed: No Bone Exposed: No Treatment Notes Wound #2 (Foot) Wound Laterality: Plantar, Right Cleanser Soap and  Water Discharge Instruction: Gently cleanse wound with antibacterial soap, rinse and pat dry prior to dressing wounds Peri-Wound Care Topical Primary Dressing Silvercel Small 2x2 (in/in) Discharge Instruction: Apply Silvercel Small 2x2 (in/in) as instructed Secondary Dressing (BORDER) Zetuvit  1-33%) Exposed Structure Granulation Quality: Pale Fascia Exposed: No Necrotic Amount: None Present (0%) Fat Layer (Subcutaneous Tissue) Exposed: Yes Tendon Exposed: No Muscle Exposed: No Joint Exposed: No Bone Exposed: No Treatment Notes Wound #4 (Foot) Wound Laterality: Right, Medial Cleanser Soap and Water Discharge Instruction: Gently cleanse wound with antibacterial soap, rinse and pat dry prior to dressing wounds Peri-Wound Care Topical Primary Dressing Silvercel Small 2x2 (in/in) Discharge Instruction: Apply Silvercel Small 2x2 (in/in) as instructed Secondary Dressing (BORDER) Zetuvit Plus SILICONE BORDER Dressing 5x5 (in/in) Discharge Instruction: Please do not put silicone bordered dressings under wraps. Use non-bordered dressing only. Secured With Compression Wrap Compression Stockings Facilities manager) Signed: 07/11/2023 4:52:24 PM By: Midge Aver MSN RN CNS WTA Entered By: Midge Aver on 07/11/2023  05:08:47 -------------------------------------------------------------------------------- Vitals Details Patient Name: Date of Service: [name], HO [place] RD [name] 07/11/2023 7:45 A M Medical Record Number: [id number] Patient Account Number: [id number] Date of Birth/Sex: Treating RN: Jun 13, 1941 (82 y.o. [name] Primary Care Enriqueta Augusta: Debbra Riding Other Clinician: Referring Gracyn Santillanes: Treating Vishal Sandlin/Extender: Jasmine Pang in Treatment: 3 [name], [name] ([id number]) [id number] Page 13 of 13 Vital Signs Time Taken: 07:53 Temperature (F): 98.0 Height (in): 68 Pulse (bpm): 72 Weight (lbs): 167 Respiratory Rate (breaths/min): 16 Body Mass Index (BMI): 25.4 Blood Pressure (mmHg): 176/73 Reference Range: 80 - 120 mg / dl Electronic Signature(s) Signed: 07/11/2023 4:52:24 PM By: Midge Aver MSN RN CNS WTA Entered By: Midge Aver on 07/11/2023 04:55:53  Plus SILICONE BORDER Dressing 4x4 (in/in) Discharge Instruction: Please do not put silicone bordered dressings under wraps. Use non-bordered dressing only. Secured With Medipore T - 29M Medipore H Soft Cloth Surgical T ape ape, 2x2 (in/yd) Kerlix Roll Sterile or Non-Sterile 6-ply 4.5x4 (yd/yd) Discharge Instruction: Apply Kerlix as directed Compression Wrap Compression Stockings Add-Ons Electronic Signature(s) Signed: 07/11/2023 4:52:24 PM By: Midge Aver MSN RN CNS WTA [place],Signed: 07/11/2023 4:52:24 PM By: Midge Aver MSN RN CNS [place] Denis [name] ([id number]) [id number] Page 10 of 13 Entered By: Midge Aver on 07/11/2023 05:07:56 -------------------------------------------------------------------------------- Wound Assessment Details Patient Name: Date of Service: [name], [place] RD [name] 07/11/2023 7:45 A M Medical Record Number: [id number] Patient Account Number: [id number] Date of Birth/Sex: Treating RN: June 24, 1941 (82 y.o. [name] Primary Care Stina Gane: Debbra Riding Other Clinician: Referring Shalayne Leach: Treating Kasandra Fehr/Extender: Jasmine Pang in Treatment: 3 Wound Status Wound Number: 3 Primary Diabetic Wound/Ulcer of the Lower Extremity Etiology: Wound Location: Right, Lateral Foot Wound Open Wounding Event: Footwear Injury Status: Date Acquired: 06/16/2023 Comorbid Congestive Heart Failure, Hypertension, Peripheral Venous Weeks Of Treatment: 3 History: Disease, Type II Diabetes, Neuropathy Clustered Wound: No Photos Wound Measurements Length: (cm) 1.2 Width: (cm) 1 Depth: (cm) 0.1 Area: (cm) 0.942 Volume: (cm)  0.094 % Reduction in Area: 40% % Reduction in Volume: 40.1% Epithelialization: None Wound Description Classification: Grade 2 Exudate Amount: Medium Exudate Type: Serosanguineous Exudate Color: red, brown Foul Odor After Cleansing: No Slough/Fibrino No Wound Bed Granulation Amount: Medium (34-66%) Exposed Structure Granulation Quality: Pink, Pale Fascia Exposed: No Necrotic Amount: None Present (0%) Fat Layer (Subcutaneous Tissue) Exposed: No Tendon Exposed: No Muscle Exposed: No Joint Exposed: No Bone Exposed: No Treatment Notes Wound #3 (Foot) Wound Laterality: Right, Lateral Cleanser [name], [name] ([id number]) [id number] Page 11 of 13 Soap and Water Discharge Instruction: Gently cleanse wound with antibacterial soap, rinse and pat dry prior to dressing wounds Peri-Wound Care Topical Primary Dressing Silvercel Small 2x2 (in/in) Discharge Instruction: Apply Silvercel Small 2x2 (in/in) as instructed Secondary Dressing (BORDER) Zetuvit Plus SILICONE BORDER Dressing 4x4 (in/in) Discharge Instruction: Please do not put silicone bordered dressings under wraps. Use non-bordered dressing only. Secured With Medipore T - 29M Medipore H Soft Cloth Surgical T ape ape, 2x2 (in/yd) Kerlix Roll Sterile or Non-Sterile 6-ply 4.5x4 (yd/yd) Discharge Instruction: Apply Kerlix as directed Compression Wrap Compression Stockings Add-Ons Electronic Signature(s) Signed: 07/11/2023 4:52:24 PM By: Midge Aver MSN RN CNS WTA Entered By: Midge Aver on 07/11/2023 08:65:78 -------------------------------------------------------------------------------- Wound Assessment Details Patient Name: Date of Service: [name], HO [place] RD [name] 07/11/2023 7:45 A M Medical Record Number: [id number] Patient Account Number: [id number] Date of Birth/Sex: Treating RN: March 11, 1941 (82 y.o. [name] Primary Care Jazmyne Beauchesne: Debbra Riding Other Clinician: Referring  Judi Jaffe: Treating Havannah Streat/Extender: Jasmine Pang in Treatment: 3 Wound Status Wound Number: 4 Primary Diabetic Wound/Ulcer of the Lower Extremity Etiology: Wound Location: Right, Medial Foot Wound Open Wounding Event: Footwear Injury Status: Date Acquired: 06/16/2023 Comorbid Congestive Heart Failure, Hypertension, Peripheral Venous Weeks Of Treatment: 3 History: Disease, Type II Diabetes, Neuropathy Clustered Wound: No Photos Wound Measurements [name], [name] ([id number]) Length: (cm) 0.1 Width: (cm) 0.1 Depth: (cm) 0.1 Area: (cm) 0.008 Volume: (cm) 0.001 [phone] Page 12 of 13 % Reduction in Area: 96.4% % Reduction in Volume: 95.5% Epithelialization: Small (1-33%) Wound Description Classification: Grade 2 Exudate Amount: Medium Exudate Type: Serosanguineous Exudate Color: red, brown Foul Odor After Cleansing: No Slough/Fibrino No Wound Bed Granulation Amount: Small (  1-33%) Exposed Structure Granulation Quality: Pale Fascia Exposed: No Necrotic Amount: None Present (0%) Fat Layer (Subcutaneous Tissue) Exposed: Yes Tendon Exposed: No Muscle Exposed: No Joint Exposed: No Bone Exposed: No Treatment Notes Wound #4 (Foot) Wound Laterality: Right, Medial Cleanser Soap and Water Discharge Instruction: Gently cleanse wound with antibacterial soap, rinse and pat dry prior to dressing wounds Peri-Wound Care Topical Primary Dressing Silvercel Small 2x2 (in/in) Discharge Instruction: Apply Silvercel Small 2x2 (in/in) as instructed Secondary Dressing (BORDER) Zetuvit Plus SILICONE BORDER Dressing 5x5 (in/in) Discharge Instruction: Please do not put silicone bordered dressings under wraps. Use non-bordered dressing only. Secured With Compression Wrap Compression Stockings Facilities manager) Signed: 07/11/2023 4:52:24 PM By: Midge Aver MSN RN CNS WTA Entered By: Midge Aver on 07/11/2023  05:08:47 -------------------------------------------------------------------------------- Vitals Details Patient Name: Date of Service: [name], HO [place] RD [name] 07/11/2023 7:45 A M Medical Record Number: [id number] Patient Account Number: [id number] Date of Birth/Sex: Treating RN: Jun 13, 1941 (82 y.o. [name] Primary Care Enriqueta Augusta: Debbra Riding Other Clinician: Referring Gracyn Santillanes: Treating Vishal Sandlin/Extender: Jasmine Pang in Treatment: 3 [name], [name] ([id number]) [id number] Page 13 of 13 Vital Signs Time Taken: 07:53 Temperature (F): 98.0 Height (in): 68 Pulse (bpm): 72 Weight (lbs): 167 Respiratory Rate (breaths/min): 16 Body Mass Index (BMI): 25.4 Blood Pressure (mmHg): 176/73 Reference Range: 80 - 120 mg / dl Electronic Signature(s) Signed: 07/11/2023 4:52:24 PM By: Midge Aver MSN RN CNS WTA Entered By: Midge Aver on 07/11/2023 04:55:53  N/A Measurements L x W x [name] (cm) 0.008 N/A N/A A (cm) : rea 0.001 N/A N/A Volume (cm) : 96.40% N/A N/A % Reduction in A rea: 95.50% N/A N/A % Reduction in Volume: Grade 2 N/A N/A Classification: Medium N/A N/A Exudate A mount: Serosanguineous N/A N/A Exudate Type: red, brown N/A N/A Exudate Color: Small (1-33%) N/A N/A Granulation A mount: Pale N/A N/A Granulation Quality: None Present (0%) N/A N/A Necrotic A mount: Fat Layer (Subcutaneous Tissue): Yes N/A N/A Exposed Structures: Fascia: No Tendon: No Muscle: No Joint: No Bone: No Small (1-33%) N/A N/A Epithelialization: Treatment Notes [name], [name] ([id number]) [phone].pdf Page 5 of 13 Electronic Signature(s) Signed: 07/11/2023 4:52:24 PM By: Midge Aver MSN RN CNS WTA Entered By: Midge Aver on 07/11/2023 05:17:09 -------------------------------------------------------------------------------- Multi-Disciplinary Care Plan Details Patient Name: Date of Service: [name], [name] 07/11/2023 7:45 A M Medical Record Number: [id number] Patient  Account Number: [id number] Date of Birth/Sex: Treating RN: May 23, 1941 (82 y.o. [name] Primary Care Colleena Kurtenbach: Debbra Riding Other Clinician: Referring Rebekkah Powless: Treating Audryna Wendt/Extender: Jasmine Pang in Treatment: 3 Active Inactive Wound/Skin Impairment Nursing Diagnoses: Impaired tissue integrity Knowledge deficit related to ulceration/compromised skin integrity Goals: Patient/caregiver will verbalize understanding of skin care regimen Date Initiated: 06/20/2023 Target Resolution Date: 07/21/2023 Goal Status: Active Ulcer/skin breakdown will have a volume reduction of 30% by week 4 Date Initiated: 06/20/2023 Target Resolution Date: 07/21/2023 Goal Status: Active Ulcer/skin breakdown will have a volume reduction of 50% by week 8 Date Initiated: 06/20/2023 Target Resolution Date: 08/20/2023 Goal Status: Active Ulcer/skin breakdown will have a volume reduction of 80% by week 12 Date Initiated: 06/20/2023 Target Resolution Date: 09/20/2023 Goal Status: Active Ulcer/skin breakdown will heal within 14 weeks Date Initiated: 06/20/2023 Target Resolution Date: 10/04/2023 Goal Status: Active Interventions: Assess patient/caregiver ability to obtain necessary supplies Assess patient/caregiver ability to perform ulcer/skin care regimen upon admission and as needed Assess ulceration(s) every visit Provide education on ulcer and skin care Treatment Activities: Referred to DME Christiann Hagerty for dressing supplies : 06/20/2023 Skin care regimen initiated : 06/20/2023 Topical wound management initiated : 06/20/2023 Notes: Electronic Signature(s) Signed: 07/11/2023 4:52:24 PM By: Midge Aver MSN RN CNS WTA Entered By: Midge Aver on 07/11/2023 05:45:26 [name] ([id number]) [id number] Page 6 of 13 -------------------------------------------------------------------------------- Pain Assessment Details Patient Name: Date of  Service: [name], [name] 07/11/2023 7:45 A M Medical Record Number: [id number] Patient Account Number: [id number] Date of Birth/Sex: Treating RN: 07-06-1941 (82 y.o. [name] Primary Care Davis Ambrosini: Debbra Riding Other Clinician: Referring Hina Gupta: Treating Romy Mcgue/Extender: Jasmine Pang in Treatment: 3 Active Problems Location of Pain Severity and Description of Pain Patient Has Paino Yes Site Locations Rate the pain. Current Pain Level: 5 Pain Management and Medication Current Pain Management: Electronic Signature(s) Signed: 07/11/2023 4:52:24 PM By: Midge Aver MSN RN CNS WTA Entered By: Midge Aver on 07/11/2023 04:56:12 -------------------------------------------------------------------------------- Patient/Caregiver Education Details Patient Name: Date of Service: [name], [name] RD [name] 10/24/2024andnbsp7:45 A M Medical Record Number: [id number] Patient Account Number: [id number] Date of Birth/Gender: Treating RN: 27-Oct-1940 (82 y.o. [name] Primary Care Physician: Debbra Riding Other Clinician: Referring Physician: Treating Physician/Extender: Jasmine Pang in Treatment: 3 [name], [name] ([id number]) [id number] Page 7 of 13 Education Assessment Education Provided To: Patient Education Topics Provided Wound Debridement: Handouts: Wound Debridement Methods: Explain/Verbal Responses: State content correctly Wound/Skin Impairment: Handouts: Caring for Your Ulcer Methods: Explain/Verbal Responses: State content

## 2023-07-16 ENCOUNTER — Ambulatory Visit: Payer: Medicare Other

## 2023-07-18 ENCOUNTER — Ambulatory Visit: Payer: Medicare Other

## 2023-07-18 ENCOUNTER — Encounter: Payer: Medicare Other | Admitting: Physician Assistant

## 2023-07-18 DIAGNOSIS — E11621 Type 2 diabetes mellitus with foot ulcer: Secondary | ICD-10-CM | POA: Diagnosis not present

## 2023-07-18 NOTE — Progress Notes (Addendum)
[name], POSTEMA D ([id number]) [id number] Page 1 of 13 Visit Report for 07/18/2023 Arrival Information Details Patient Name: Date of Service: [name], [name] 07/18/2023 1:15 PM Medical Record Number: [id number] Patient Account Number: [id number] Date of Birth/Sex: Treating RN: [birth year] (82 y.o. [name] Primary Care Hollyann Pablo: Debbra Riding Other Clinician: Referring Laury Huizar: Treating Makiyah Zentz/Extender: Jasmine Pang in Treatment: 4 Visit Information History Since Last Visit Added or deleted any medications: No Patient Arrived: Wheel Chair Any new allergies or adverse reactions: No Arrival Time: 13:42 Has Dressing in Place as Prescribed: Yes Accompanied By: dfaughter in law Pain Present Now: No Transfer Assistance: EasyPivot Patient Lift Patient Identification Verified: Yes Secondary Verification Process Completed: Yes Patient Requires Transmission-Based Precautions: No Patient Has Alerts: Yes Patient Alerts: Patient on Blood Thinner diabetes type 2 Plavix Electronic Signature(s) Signed: 07/18/2023 4:51:37 PM By: Midge Aver MSN RN CNS WTA Entered By: Midge Aver on 07/18/2023 10:48:31 -------------------------------------------------------------------------------- Clinic Level of Care Assessment Details Patient Name: Date of Service: [name], [name] 07/18/2023 1:15 PM Medical Record Number: [id number] Patient Account Number: [id number] Date of Birth/Sex: Treating RN: [birth year] (82 y.o. [name] Primary Care Ketara Cavness: Debbra Riding Other Clinician: Referring Oden Lindaman: Treating Serenidy Waltz/Extender: Jasmine Pang in Treatment: 4 Clinic Level of Care Assessment Items TOOL 1 Quantity Score []  - 0 Use when EandM and Procedure is performed on INITIAL visit ASSESSMENTS - Nursing Assessment / Reassessment []  - 0 General Physical Exam (combine w/ comprehensive assessment (listed  just below) when performed on new pt. evals) []  - 0 Comprehensive Assessment (HX, ROS, Risk Assessments, Wounds Hx, etc.) ASSESSMENTS - Wound and Skin Assessment / Reassessment []  - 0 Dermatologic / Skin Assessment (not related to wound area) MICHAELANTHONY, KEMPTON D ([id number]) [id number] Page 2 of 13 ASSESSMENTS - Ostomy and/or Continence Assessment and Care []  - 0 Incontinence Assessment and Management []  - 0 Ostomy Care Assessment and Management (repouching, etc.) PROCESS - Coordination of Care []  - 0 Simple Patient / Family Education for ongoing care []  - 0 Complex (extensive) Patient / Family Education for ongoing care []  - 0 Staff obtains Chiropractor, Records, T Results / Process Orders est []  - 0 Staff telephones HHA, Nursing Homes / Clarify orders / etc []  - 0 Routine Transfer to another Facility (non-emergent condition) []  - 0 Routine Hospital Admission (non-emergent condition) []  - 0 New Admissions / Manufacturing engineer / Ordering NPWT Apligraf, etc. , []  - 0 Emergency Hospital Admission (emergent condition) PROCESS - Special Needs []  - 0 Pediatric / Minor Patient Management []  - 0 Isolation Patient Management []  - 0 Hearing / Language / Visual special needs []  - 0 Assessment of Community assistance (transportation, D/C planning, etc.) []  - 0 Additional assistance / Altered mentation []  - 0 Support Surface(s) Assessment (bed, cushion, seat, etc.) INTERVENTIONS - Miscellaneous []  - 0 External ear exam []  - 0 Patient Transfer (multiple staff / Nurse, adult / Similar devices) []  - 0 Simple Staple / Suture removal (25 or less) []  - 0 Complex Staple / Suture removal (26 or more) []  - 0 Hypo/Hyperglycemic Management (do not check if billed separately) []  - 0 Ankle / Brachial Index (ABI) - do not check if billed separately Has the patient been seen at the hospital within the last three years: Yes Total Score: 0 Level Of Care:  ____ Electronic Signature(s) Signed: 07/18/2023 4:51:37 PM By: Midge Aver MSN RN CNS WTA Entered By: Midge Aver on 07/18/2023 11:37:34 --------------------------------------------------------------------------------  Encounter Discharge Information Details Patient Name: Date of Service: [name], [name] 07/18/2023 1:15 PM Medical Record Number: [id number] Patient Account Number: [id number] Date of Birth/Sex: Treating RN: [birth year] (82 y.o. [name] Primary Care Tariyah Pendry: Debbra Riding Other Clinician: Referring Eyvette Cordon: Treating Neah Sporrer/Extender: Jasmine Pang in Treatment: 4 Encounter Discharge Information Items Post Procedure [name], [name] ([id number]) [id number] Page 3 of 13 Discharge Condition: Stable Temperature (F): 97.9 Ambulatory Status: Cane Pulse (bpm): 67 Discharge Destination: Home Respiratory Rate (breaths/min): 18 Transportation: Private Auto Blood Pressure (mmHg): 160/59 Accompanied By: dfaughter in law Schedule Follow-up Appointment: Yes Clinical Summary of Care: Electronic Signature(s) Signed: 07/18/2023 4:51:37 PM By: Midge Aver MSN RN CNS WTA Entered By: Midge Aver on 07/18/2023 11:39:24 -------------------------------------------------------------------------------- Lower Extremity Assessment Details Patient Name: Date of Service: [name], [name] RD D. 07/18/2023 1:15 PM Medical Record Number: [id number] Patient Account Number: [id number] Date of Birth/Sex: Treating RN: 04-08-1941 (82 y.o. [name] Primary Care Vannie Hochstetler: Debbra Riding Other Clinician: Referring Micaiah Litle: Treating Chima Astorino/Extender: Jasmine Pang in Treatment: 4 Electronic Signature(s) Signed: 07/18/2023 4:51:37 PM By: Midge Aver MSN RN CNS WTA Entered By: Midge Aver on 07/18/2023 11:12:21 -------------------------------------------------------------------------------- Multi  Wound Chart Details Patient Name: Date of Service: [name], [place] RD D. 07/18/2023 1:15 PM Medical Record Number: [id number] Patient Account Number: [id number] Date of Birth/Sex: Treating RN: 1941-05-31 (82 y.o. [name] Primary Care Jearldean Gutt: Debbra Riding Other Clinician: Referring Vanda Waskey: Treating Daking Westervelt/Extender: Jasmine Pang in Treatment: 4 Vital Signs Height(in): 68 Pulse(bpm): 67 Weight(lbs): 167 Blood Pressure(mmHg): 160/59 Body Mass Index(BMI): 25.4 Temperature(F): 97.9 Respiratory Rate(breaths/min): 16 [1:Photos:] [[id number] Page 4 of 13] Right Amputation Site - Toe Right, Plantar Foot Right, Lateral Foot Wound Location: Surgical Injury Footwear Injury Footwear Injury Wounding Event: Dehisced Wound Diabetic Wound/Ulcer of the Lower Diabetic Wound/Ulcer of the Lower Primary Etiology: Extremity Extremity Congestive Heart Failure, Congestive Heart Failure, Congestive Heart Failure, Comorbid History: Hypertension, Peripheral Venous Hypertension, Peripheral Venous Hypertension, Peripheral Venous Disease, Type II Diabetes, Disease, Type II Diabetes, Disease, Type II Diabetes, Neuropathy Neuropathy Neuropathy 04/16/2023 06/16/2023 06/16/2023 Date Acquired: 4 4 4  Weeks of Treatment: Open Open Open Wound Status: No No No Wound Recurrence: 1x1x1.5 1x1x0.1 1.7x1.2x0.1 Measurements L x W x D (cm) 0.785 0.785 1.602 A (cm) : rea 1.178 0.079 0.16 Volume (cm) : 33.40% -103.90% -2.00% % Reduction in Area: 23.10% -107.90% -1.90% % Reduction in Volume: Full Thickness Without Exposed Grade 1 Grade 2 Classification: Support Structures Medium Medium Medium Exudate Amount: Serosanguineous Serosanguineous Serosanguineous Exudate Type: red, brown red, brown red, brown Exudate Color: Medium (34-66%) Medium (34-66%) Medium (34-66%) Granulation Amount: Pink Red, Pink Pink, Pale Granulation Quality: N/A None  Present (0%) None Present (0%) Necrotic Amount: Fat Layer (Subcutaneous Tissue): Yes Fascia: No Fascia: No Exposed Structures: Fascia: No Fat Layer (Subcutaneous Tissue): No Fat Layer (Subcutaneous Tissue): No Tendon: No Tendon: No Tendon: No Muscle: No Muscle: No Muscle: No Joint: No Joint: No Joint: No Bone: No Bone: No Bone: No None Small (1-33%) None Epithelialization: Wound Number: 4 N/A N/A Photos: N/A N/A Right, Medial Foot N/A N/A Wound Location: Footwear Injury N/A N/A Wounding Event: Diabetic Wound/Ulcer of the Lower N/A N/A Primary Etiology: Extremity Congestive Heart Failure, N/A N/A Comorbid History: Hypertension, Peripheral Venous Disease, Type II Diabetes, Neuropathy 06/16/2023 N/A N/A Date Acquired: 4 N/A N/A Weeks of Treatment: Healed - Epithelialized N/A N/A Wound Status: No N/A N/A Wound Recurrence: 0x0x0 N/A N/A  Measurements L x W x D (cm) 0 N/A N/A A (cm) : rea 0 N/A N/A Volume (cm) : 96.40% N/A N/A % Reduction in A rea: 95.50% N/A N/A % Reduction in Volume: Grade 2 N/A N/A Classification: Medium N/A N/A Exudate A mount: Serosanguineous N/A N/A Exudate Type: red, brown N/A N/A Exudate Color: Small (1-33%) N/A N/A Granulation A mount: Pale N/A N/A Granulation Quality: None Present (0%) N/A N/A Necrotic A mount: Fat Layer (Subcutaneous Tissue): Yes N/A N/A Exposed Structures: Fascia: No Tendon: No Muscle: No Joint: No Bone: No Small (1-33%) N/A N/A Epithelialization: Treatment Notes MALAKYE, NOLDEN ([id number]) [id number] Page 5 of 13 Electronic Signature(s) Signed: 07/18/2023 4:51:37 PM By: Midge Aver MSN RN CNS WTA Entered By: Midge Aver on 07/18/2023 11:12:40 -------------------------------------------------------------------------------- Multi-Disciplinary Care Plan Details Patient Name: Date of Service: [name], [name] 07/18/2023 1:15 PM Medical Record Number:  [id number] Patient Account Number: [id number] Date of Birth/Sex: Treating RN: Aug 04, 1941 (82 y.o. [name] Primary Care Lorijean Husser: Debbra Riding Other Clinician: Referring Babe Anthis: Treating Roise Emert/Extender: Jasmine Pang in Treatment: 4 Active Inactive Wound/Skin Impairment Nursing Diagnoses: Impaired tissue integrity Knowledge deficit related to ulceration/compromised skin integrity Goals: Patient/caregiver will verbalize understanding of skin care regimen Date Initiated: 06/20/2023 Date Inactivated: 07/18/2023 Target Resolution Date: 07/21/2023 Goal Status: Met Ulcer/skin breakdown will have a volume reduction of 30% by week 4 Date Initiated: 06/20/2023 Date Inactivated: 07/18/2023 Target Resolution Date: 07/21/2023 Goal Status: Met Ulcer/skin breakdown will have a volume reduction of 50% by week 8 Date Initiated: 06/20/2023 Target Resolution Date: 08/20/2023 Goal Status: Active Ulcer/skin breakdown will have a volume reduction of 80% by week 12 Date Initiated: 06/20/2023 Target Resolution Date: 09/20/2023 Goal Status: Active Ulcer/skin breakdown will heal within 14 weeks Date Initiated: 06/20/2023 Target Resolution Date: 10/04/2023 Goal Status: Active Interventions: Assess patient/caregiver ability to obtain necessary supplies Assess patient/caregiver ability to perform ulcer/skin care regimen upon admission and as needed Assess ulceration(s) every visit Provide education on ulcer and skin care Treatment Activities: Referred to DME Toretto Tingler for dressing supplies : 06/20/2023 Skin care regimen initiated : 06/20/2023 Topical wound management initiated : 06/20/2023 Notes: Electronic Signature(s) Signed: 07/18/2023 4:51:37 PM By: Midge Aver MSN RN CNS WTA Entered By: Midge Aver on 07/18/2023 11:37:58 Peter Congo D ([id number]) [id number] Page 6 of  13 -------------------------------------------------------------------------------- Pain Assessment Details Patient Name: Date of Service: [name], [name] 07/18/2023 1:15 PM Medical Record Number: [id number] Patient Account Number: [id number] Date of Birth/Sex: Treating RN: 1941-08-17 (82 y.o. [name] Primary Care Abhijay Morriss: Debbra Riding Other Clinician: Referring Diavian Furgason: Treating Pecolia Marando/Extender: Jasmine Pang in Treatment: 4 Active Problems Location of Pain Severity and Description of Pain Patient Has Paino Yes Site Locations Rate the pain. Current Pain Level: 2 Pain Management and Medication Current Pain Management: Electronic Signature(s) Signed: 07/18/2023 4:51:37 PM By: Midge Aver MSN RN CNS WTA Entered By: Midge Aver on 07/18/2023 10:49:54 -------------------------------------------------------------------------------- Patient/Caregiver Education Details Patient Name: Date of Service: [name], [name] RD D. 10/31/2024andnbsp1:15 PM Medical Record Number: [id number] Patient Account Number: [id number] Date of Birth/Gender: Treating RN: [birth year] (82 y.o. [name] Primary Care Physician: Debbra Riding Other Clinician: Referring Physician: Treating Physician/Extender: Jasmine Pang in Treatment: 4 [place], [place] D ([id number]) [id number] Page 7 of 13 Education Assessment Education Provided To: Patient Education Topics Provided Wound/Skin Impairment: Handouts: Caring for Your Ulcer Methods: Explain/Verbal Responses: State content correctly Electronic Signature(s) Signed: 07/18/2023 4:51:37 PM By: Katrinka Blazing,  Chip Boer MSN RN CNS WTA Entered By: Midge Aver on 07/18/2023 11:38:10 -------------------------------------------------------------------------------- Wound Assessment Details Patient Name: Date of Service: [name], [name] 07/18/2023 1:15 PM Medical Record Number:  [id number] Patient Account Number: [id number] Date of Birth/Sex: Treating RN: February 19, 1941 (81 y.o. [name] Primary Care Gearlene Godsil: Debbra Riding Other Clinician: Referring Westin Knotts: Treating Cannen Dupras/Extender: Jasmine Pang in Treatment: 4 Wound Status Wound Number: 1 Primary Dehisced Wound Etiology: Wound Location: Right Amputation Site - Toe Wound Open Wounding Event: Surgical Injury Status: Date Acquired: 04/16/2023 Comorbid Congestive Heart Failure, Hypertension, Peripheral Venous Weeks Of Treatment: 4 History: Disease, Type II Diabetes, Neuropathy Clustered Wound: No Photos Wound Measurements Length: (cm) 1 Width: (cm) 1 Depth: (cm) 1.5 Area: (cm) 0.785 Volume: (cm) 1.178 % Reduction in Area: 33.4% % Reduction in Volume: 23.1% Epithelialization: None Wound Description Classification: Full Thickness Without Exposed Support Structures Exudate Amount: Medium Exudate Type: Serosanguineous Seals, Lakoda D ([id number]) Exudate Color: red, brown Foul Odor After Cleansing: No Slough/Fibrino No [id number] Page 8 of 13 Wound Bed Granulation Amount: Medium (34-66%) Exposed Structure Granulation Quality: Pink Fascia Exposed: No Fat Layer (Subcutaneous Tissue) Exposed: Yes Tendon Exposed: No Muscle Exposed: No Joint Exposed: No Bone Exposed: No Treatment Notes Wound #1 (Amputation Site - Toe) Wound Laterality: Right Cleanser Byram Ancillary Kit - 15 Day Supply Discharge Instruction: Use supplies as instructed; Kit contains: (15) Saline Bullets; (15) 3x3 Gauze; 15 pr Gloves Dakin 16 (oz) 0.25 Discharge Instruction: Use as directed. Peri-Wound Care Topical Primary Dressing Gauze Discharge Instruction: As directed: dry, moistened with saline or moistened with Dakins Solution Secondary Dressing Conforming Guaze Roll-Large Discharge Instruction: Apply Conforming Stretch Guaze Bandage as directed Secured  With Medipore T - 79M Medipore H Soft Cloth Surgical T ape ape, 2x2 (in/yd) Compression Wrap Compression Stockings Add-Ons Electronic Signature(s) Signed: 07/18/2023 4:51:37 PM By: Midge Aver MSN RN CNS WTA Entered By: Midge Aver on 07/18/2023 11:02:18 -------------------------------------------------------------------------------- Wound Assessment Details Patient Name: Date of Service: [name], [place] RD D. 07/18/2023 1:15 PM Medical Record Number: [id number] Patient Account Number: [id number] Date of Birth/Sex: Treating RN: December 30, 1940 (82 y.o. [name] Primary Care Davene Jobin: Debbra Riding Other Clinician: Referring Delinda Malan: Treating Miko Sirico/Extender: Jasmine Pang in Treatment: 4 Wound Status Wound Number: 2 Primary Diabetic Wound/Ulcer of the Lower Extremity Etiology: Wound Location: Right, Plantar Foot Wound Open Wounding Event: Footwear Injury Status: Date Acquired: 06/16/2023 Comorbid Congestive Heart Failure, Hypertension, Peripheral Venous Weeks Of Treatment: 4 History: Disease, Type II Diabetes, Neuropathy Clustered Wound: No JERICK, KHACHATRYAN ([id number]) [id number] Page 9 of 13 Photos Wound Measurements Length: (cm) 1 Width: (cm) 1 Depth: (cm) 0.1 Area: (cm) 0.785 Volume: (cm) 0.079 % Reduction in Area: -103.9% % Reduction in Volume: -107.9% Epithelialization: Small (1-33%) Wound Description Classification: Grade 1 Exudate Amount: Medium Exudate Type: Serosanguineous Exudate Color: red, brown Foul Odor After Cleansing: No Slough/Fibrino No Wound Bed Granulation Amount: Medium (34-66%) Exposed Structure Granulation Quality: Red, Pink Fascia Exposed: No Necrotic Amount: None Present (0%) Fat Layer (Subcutaneous Tissue) Exposed: No Tendon Exposed: No Muscle Exposed: No Joint Exposed: No Bone Exposed: No Treatment Notes Wound #2 (Foot) Wound Laterality: Plantar, Right Cleanser Soap and  Water Discharge Instruction: Gently cleanse wound with antibacterial soap, rinse and pat dry prior to dressing wounds Peri-Wound Care Topical Primary Dressing IODOFLEX 0.9% Cadexomer Iodine Pad Discharge Instruction: Apply Iodoflex to wound bed only as directed. Secondary Dressing ABD Pad 5x9 (in/in) Discharge Instruction: Cover with ABD pad Secured With  Medipore T - 68M Medipore H Soft Cloth Surgical T ape ape, 2x2 (in/yd) Kerlix Roll Sterile or Non-Sterile 6-ply 4.5x4 (yd/yd) Discharge Instruction: Apply Kerlix as directed Compression Wrap Compression Stockings Add-Ons Electronic Signature(s) Signed: 07/18/2023 4:51:37 PM By: Midge Aver MSN RN CNS WTA Entered By: Midge Aver on 07/18/2023 11:02:50 Peter [place] D ([id number]) [id number] Page 10 of 13 -------------------------------------------------------------------------------- Wound Assessment Details Patient Name: Date of Service: [name], [name] 07/18/2023 1:15 PM Medical Record Number: [id number] Patient Account Number: [id number] Date of Birth/Sex: Treating RN: [birth year] (82 y.o. [name] Primary Care Jaylianna Tatlock: Debbra Riding Other Clinician: Referring Terryann Verbeek: Treating Channing Yeager/Extender: Jasmine Pang in Treatment: 4 Wound Status Wound Number: 3 Primary Diabetic Wound/Ulcer of the Lower Extremity Etiology: Wound Location: Right, Lateral Foot Wound Open Wounding Event: Footwear Injury Status: Date Acquired: 06/16/2023 Comorbid Congestive Heart Failure, Hypertension, Peripheral Venous Weeks Of Treatment: 4 History: Disease, Type II Diabetes, Neuropathy Clustered Wound: No Photos Wound Measurements Length: (cm) 1.7 Width: (cm) 1.2 Depth: (cm) 0.1 Area: (cm) 1.602 Volume: (cm) 0.16 % Reduction in Area: -2% % Reduction in Volume: -1.9% Epithelialization: None Wound Description Classification: Grade 2 Exudate Amount: Medium Exudate Type:  Serosanguineous Exudate Color: red, brown Foul Odor After Cleansing: No Slough/Fibrino No Wound Bed Granulation Amount: Medium (34-66%) Exposed Structure Granulation Quality: Pink, Pale Fascia Exposed: No Necrotic Amount: None Present (0%) Fat Layer (Subcutaneous Tissue) Exposed: No Tendon Exposed: No Muscle Exposed: No Joint Exposed: No Bone Exposed: No Treatment Notes Wound #3 (Foot) Wound Laterality: Right, Lateral Cleanser Soap and Water Discharge Instruction: Gently cleanse wound with antibacterial soap, rinse and pat dry prior to dressing wounds Peri-Wound Care DELANEY, SCHNICK ([id number]) [id number] Page 11 of 13 Topical Primary Dressing IODOFLEX 0.9% Cadexomer Iodine Pad Discharge Instruction: Apply Iodoflex to wound bed only as directed. Secondary Dressing ABD Pad 5x9 (in/in) Discharge Instruction: Cover with ABD pad Secured With Medipore T - 68M Medipore H Soft Cloth Surgical T ape ape, 2x2 (in/yd) Kerlix Roll Sterile or Non-Sterile 6-ply 4.5x4 (yd/yd) Discharge Instruction: Apply Kerlix as directed Compression Wrap Compression Stockings Add-Ons Electronic Signature(s) Signed: 07/18/2023 4:51:37 PM By: Midge Aver MSN RN CNS WTA Entered By: Midge Aver on 07/18/2023 11:03:12 -------------------------------------------------------------------------------- Wound Assessment Details Patient Name: Date of Service: [name], [place] RD D. 07/18/2023 1:15 PM Medical Record Number: [id number] Patient Account Number: [id number] Date of Birth/Sex: Treating RN: [birth year] (82 y.o. [name] Primary Care Ezechiel Stooksbury: Debbra Riding Other Clinician: Referring Tyisha Cressy: Treating Cannon Arreola/Extender: Jasmine Pang in Treatment: 4 Wound Status Wound Number: 4 Primary Diabetic Wound/Ulcer of the Lower Extremity Etiology: Wound Location: Right, Medial Foot Wound Healed - Epithelialized Wounding Event: Footwear  Injury Status: Date Acquired: 06/16/2023 Comorbid Congestive Heart Failure, Hypertension, Peripheral Venous Weeks Of Treatment: 4 History: Disease, Type II Diabetes, Neuropathy Clustered Wound: No Photos Wound Measurements Length: (cm) Width: (cm) Depth: (cm) Area: (cm) Bogle, Ladarrell D ([id number]) Volume: (cm) 0 0 % Reduction in Area: 96.4% 0 % Reduction in Volume: 95.5% 0 Epithelialization: Small (1-33%) 0 [id number] Page 12 of 13 Wound Description Classification: Grade 2 Exudate Amount: Medium Exudate Type: Serosanguineous Exudate Color: red, brown Foul Odor After Cleansing: No Slough/Fibrino No Wound Bed Granulation Amount: Small (1-33%) Exposed Structure Granulation Quality: Pale Fascia Exposed: No Necrotic Amount: None Present (0%) Fat Layer (Subcutaneous Tissue) Exposed: Yes Tendon Exposed: No Muscle Exposed: No Joint Exposed: No Bone Exposed: No Treatment Notes Wound #4 (Foot) Wound Laterality: Right, Medial Cleanser Peri-Wound Care  Topical Primary Dressing Secondary Dressing Secured With Compression Wrap Compression Stockings Add-Ons Electronic Signature(s) Signed: 07/18/2023 4:51:37 PM By: Midge Aver MSN RN CNS WTA Entered By: Midge Aver on 07/18/2023 11:11:56 -------------------------------------------------------------------------------- Vitals Details Patient Name: Date of Service: [name], [place] RD D. 07/18/2023 1:15 PM Medical Record Number: [id number] Patient Account Number: [id number] Date of Birth/Sex: Treating RN: 03/22/1941 (82 y.o. [name] Primary Care Amirrah Quigley: Debbra Riding Other Clinician: Referring Uziel Covault: Treating Shaquel Josephson/Extender: Jasmine Pang in Treatment: 4 Vital Signs Time Taken: 13:48 Temperature (F): 97.9 Height (in): 68 Pulse (bpm): 67 Weight (lbs): 167 Respiratory Rate (breaths/min): 16 Body Mass Index (BMI): 25.4 Blood Pressure (mmHg):  160/59 Reference Range: 80 - 120 mg / dl Electronic Signature(s) Signed: 07/18/2023 4:51:37 PM By: Midge Aver MSN RN CNS [address], [place] D ([id number]) [id number] Page 13 of 13 Entered By: Midge Aver on 07/18/2023 10:49:18

## 2023-07-18 NOTE — Progress Notes (Addendum)
[name] ([id number]) [id number] Page 1 of 10 Visit Report for 07/18/2023 Chief Complaint Document Details Patient Name: Date of Service: [name], [name] 07/18/2023 1:15 PM Medical Record Number: [id number] Patient Account Number: [id number] Date of Birth/Sex: Treating RN: [birth year] (82 y.o. [name] Primary Care Provider: Debbra [name] Charles Clinician: Referring Provider: Treating Provider/Extender: Jasmine Pang in Treatment: 4 Information Obtained from: Patient Chief Complaint Right foot ulcers Electronic Signature(s) Signed: 07/18/2023 1:42:26 PM By: Allen Derry PA-C Entered By: Allen Derry on 07/18/2023 10:42:26 -------------------------------------------------------------------------------- Debridement Details Patient Name: Date of Service: [name], [place] RD [name] 07/18/2023 1:15 PM Medical Record Number: [id number] Patient Account Number: [id number] Date of Birth/Sex: Treating RN: [birth year] (82 y.o. [name] Primary Care Provider: Debbra [name] Charles Clinician: Referring Provider: Treating Provider/Extender: Jasmine Pang in Treatment: 4 Debridement Performed for Assessment: Wound #3 Right,Lateral Foot Performed By: Physician Allen Derry, PA-C The following information was scribed by: Midge Aver The information was scribed for: Allen Derry Debridement Type: Debridement Severity of Tissue Pre Debridement: Fat layer exposed Level of Consciousness (Pre-procedure): Awake and Alert Pre-procedure Verification/Time Out Yes - 14:14 Taken: Start Time: 14:14 Percent of Wound Bed Debrided: 100% T Area Debrided (cm): otal 1.6 Tissue and Charles material debrided: Viable, Non-Viable, Eschar, Slough, Skin: Dermis , Skin: Epidermis, Slough Level: Skin/Epidermis Debridement Description: Selective/Open Wound Instrument: Curette Bleeding: None Procedural Pain: 0 [name], [name]  ([id number]) [id number] Page 2 of 10 Post Procedural Pain: 0 Response to Treatment: Procedure was tolerated well Level of Consciousness (Post- Awake and Alert procedure): Post Debridement Measurements of Total Wound Length: (cm) 1.7 Width: (cm) 1.2 Depth: (cm) 0.1 Volume: (cm) 0.16 Character of Wound/Ulcer Post Debridement: Stable Severity of Tissue Post Debridement: Fat layer exposed Post Procedure Diagnosis Same as Pre-procedure Electronic Signature(s) Signed: 07/18/2023 4:51:37 PM By: Midge Aver MSN RN CNS WTA Signed: 07/18/2023 5:26:58 PM By: Allen Derry PA-C Entered By: Midge Aver on 07/18/2023 11:17:18 -------------------------------------------------------------------------------- Debridement Details Patient Name: Date of Service: [name], HO [place] RD [name] 07/18/2023 1:15 PM Medical Record Number: [id number] Patient Account Number: [id number] Date of Birth/Sex: Treating RN: May 25, 1941 (82 y.o. [name] Primary Care Provider: Debbra [name] Charles Clinician: Referring Provider: Treating Provider/Extender: Jasmine Pang in Treatment: 4 Debridement Performed for Assessment: Wound #1 Right Amputation Site - Toe Performed By: Physician Allen Derry, PA-C Debridement Type: Debridement Level of Consciousness (Pre-procedure): Awake and Alert Pre-procedure Verification/Time Out Yes - 14:14 Taken: Start Time: 14:14 Pain Control: Lidocaine 4% T opical Solution Percent of Wound Bed Debrided: 100% T Area Debrided (cm): otal 0.78 Tissue and Charles material debrided: Viable, Non-Viable, Callus, Slough, Subcutaneous, Slough Level: Skin/Subcutaneous Tissue Debridement Description: Excisional Instrument: Curette Bleeding: None Procedural Pain: 0 Post Procedural Pain: 0 Response to Treatment: Procedure was tolerated well Level of Consciousness (Post- Awake and Alert procedure): Post Debridement Measurements of Total  Wound Length: (cm) 1 Width: (cm) 1 Depth: (cm) 1.5 Volume: (cm) 1.178 Character of Wound/Ulcer Post Debridement: Stable Post Procedure Diagnosis Same as [name], [name] ([id number]) [id number] Page 3 of 10 Electronic Signature(s) Signed: 07/18/2023 4:51:37 PM By: Midge Aver MSN RN CNS WTA Signed: 07/18/2023 5:26:58 PM By: Allen Derry PA-C Entered By: Midge Aver on 07/18/2023 11:19:59 -------------------------------------------------------------------------------- HPI Details Patient Name: Date of Service: [name], HO [place] RD [name] 07/18/2023 1:15 PM Medical Record Number: [id number] Patient Account Number: [id number] Date of Birth/Sex: Treating RN: 1940/12/18 (82 y.o. [name]  Primary Care Provider: Debbra [name] Charles Clinician: Referring Provider: Treating Provider/Extender: Jasmine Pang in Treatment: 4 History of Present Illness Chronic/Inactive Conditions Condition 1: Notes from Vascular 05/21/23: Right below-the-knee popliteal artery to peroneal artery bypass with reverse saphenous vein graft. This was done on 04/11/2023. Noninvasive studies in our an ABI 0.61 on the right and 0.67 on the left. T oday arterial duplexes were done which shows biphasic/triphasic waveforms throughout the lower extremities down to the level of the tibial vessels. 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 This is suggestive that the patient's bypass is patent although it is not there is no noted occlusion on duplex 1. Atherosclerosis of native arteries of the extremities with ulceration (HCC) Based on noninvasive studies today appears that the patient would have possible marginal ability for wound healing. Concerned that some of the patient is usually a small vessel disease which may proved to have a little bit more  difficult. Will have the patient return in several weeks to see if there has been improvement. They are advised that if the wound deteriorates or begins to show signs of starting to contact us for sooner follow-up. 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 With that being said that area is somewhat deep to be perfectly honest. It does have a little bit concerned. The remaining areas all came from the boot he was wearing and areas that rubbed they have gotten rid of the boot this will not happen again as far as that is concerned. With that being said the patient has had vascular intervention and the note is above as documented in the vascular note. With that being said it does appear that the patient has what is termed "possible marginal ability for wound healing". I think this is basically something this can be quite difficult for [place] to completely close. I am not seeing them possible but again there is a long road ahead. Patient does have a history of diabetes mellitus type 2, hypertension, peripheral vascular disease, coronary artery disease, long-term use of anticoagulants, chronic kidney disease stage III, and as of right now there is no direct evidence of continued and ongoing osteomyelitis although I do question whether or not there may be an issue here. 06-27-2023 upon evaluation today patient appears to be doing okay in regard to his wounds. He is showing signs of some improvement which is good news although this appears to be a little bit too moist in regard to the wounds on his feet in general. I do believe that he would benefit from a switching dressings to silver alginate away from the Xeroform at this point unfortunately. With that being said the original wound for which she was referred actually does appear  to be doing okay at this point in my opinion. I am actually somewhat pleased with where we stand in that regard. I do not see any evidence of active infection at this time which is good. I think the Dakin's moistened gauze is helping. 07-04-2023 upon evaluation today patient appears to be doing well currently in regard to his wounds all things considered. I do believe that he is making headway towards some closure and this is good news. Fortunately I do not see any evidence  of active infection looking or systemically which is great news as well. No fevers, chills, nausea, vomiting, or diarrhea. 07-11-2023 upon evaluation today patient appears to be doing well currently in regard to his wound. He has primary wound at the amputation site is better and the Charles wounds are all showing signs of improvement as well the worst being the right lateral foot but even that seems better with the Betadine that we switch to last week. 07-18-2023 upon evaluation today patient appears to be doing well currently in regard to his wound. He has been tolerating the dressing changes without complication. Fortunately I do not see any signs of active infection at this time. Electronic Signature(s) Signed: 07/18/2023 2:24:04 PM By: Allen Derry PA-C Entered By: Allen Derry on 07/18/2023 11:24:04 [name] ([id number]) [id number] Page 4 of 10 -------------------------------------------------------------------------------- Physical Exam Details Patient Name: Date of Service: [name], [name] 07/18/2023 1:15 PM Medical Record Number: [id number] Patient Account Number: [id number] Date of Birth/Sex: Treating RN: 1941/07/27 (82 y.o. [name] Primary Care Provider: Debbra [name] Charles Clinician: Referring Provider: Treating Provider/Extender: Jasmine Pang in Treatment: 4 Constitutional Well-nourished and well-hydrated in no acute  distress. Respiratory normal breathing without difficulty. Psychiatric this patient is able to make decisions and demonstrates good insight into disease process. Alert and Oriented x 3. pleasant and cooperative. Notes Upon inspection patient's wound bed actually showed signs of having some dead tissue noted at all wound sites again I think 1 is healed on the medial portion of his foot but the Charles areas are still open at this point and specifically the amputation site is showing signs of cleaning up it is not as deep I do not feel bone in the base of the wound this is actually a dramatic improvement. Electronic Signature(s) Signed: 07/18/2023 2:24:29 PM By: Allen Derry PA-C Entered By: Allen Derry on 07/18/2023 11:24:29 -------------------------------------------------------------------------------- Physician Orders Details Patient Name: Date of Service: [name], [place] RD [name] 07/18/2023 1:15 PM Medical Record Number: [id number] Patient Account Number: [id number] Date of Birth/Sex: Treating RN: 10/17/1940 (82 y.o. 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 Bathing/ [employer] wounds with antibacterial soap and water. May shower; gently cleanse wound with antibacterial soap, rinse and pat dry prior to dressing wounds Anesthetic (Use 'Patient Medications' Section for Anesthetic Order Entry) Lidocaine applied to wound bed Wound Treatment Wound #1 - Amputation Site - Toe Wound Laterality: Right Cleanser: [employer]ram Ancillary Kit - 15 Day Supply (Generic) 1 x Per Day/30 Days Discharge Instructions: Use supplies as instructed; Kit contains: (15) Saline Bullets; (15) 3x3 Gauze; 15 pr Gloves Cleanser: Dakin 16 (oz) 0.25 1 x Per Day/30 Days Discharge Instructions: Use as directed. Prim Dressing: Gauze (Generic) 1 x Per  Day/30 Days ary Discharge Instructions: As directed: dry, moistened with saline or moistened with Dakins Solution Secondary Dressing: Conforming Guaze Roll-Large (Generic) 1 x Per Day/30 Days Discharge Instructions: Apply Conforming Stretch Guaze Bandage as directed Secured With: Medipore T - 68M Medipore H Soft Cloth Surgical T ape ape, 2x2 (in/yd) (Generic) 1 x Per Day/30 Days Wound #2 - Foot Wound Laterality: Plantar, Right Cleanser: Soap and Water 3 x Per Week/30 Days Discharge Instructions: Gently cleanse wound with antibacterial soap, rinse and pat dry prior to dressing wounds Prim Dressing: IODOFLEX 0.9% Cadexomer Iodine Pad (DME) (Generic) 3 x Per Week/30 Days ary Discharge Instructions: Apply Iodoflex to wound bed only as directed. Secondary Dressing: ABD Pad 5x9 (in/in) (DME) (Generic) 3 x Per Week/30 Days Discharge Instructions: Cover with ABD pad Secured With: Medipore T - 68M Medipore H Soft Cloth Surgical T ape ape, 2x2 (in/yd) (Generic) 3 x Per Week/30 Days Secured With: [employer] Sterile or Non-Sterile 6-ply 4.5x4 (yd/yd) (Generic) 3 x Per Week/30 Days Discharge Instructions: Apply Kerlix as  directed Wound #3 - Foot Wound Laterality: Right, Lateral Cleanser: Soap and Water 3 x Per Week/30 Days Discharge Instructions: Gently cleanse wound with antibacterial soap, rinse and pat dry prior to dressing wounds Prim Dressing: IODOFLEX 0.9% Cadexomer Iodine Pad (DME) (Generic) 3 x Per Week/30 Days ary Discharge Instructions: Apply Iodoflex to wound bed only as directed. Secondary Dressing: ABD Pad 5x9 (in/in) (DME) (Generic) 3 x Per Week/30 Days Discharge Instructions: Cover with ABD pad Secured With: Medipore T - 68M Medipore H Soft Cloth Surgical T ape ape, 2x2 (in/yd) (DME) (Generic) 3 x Per Week/30 Days Secured With: Kerlix Roll Sterile or Non-Sterile 6-ply 4.5x4 (yd/yd) (DME) (Generic) 3 x Per Week/30 Days Discharge Instructions: Apply Kerlix as directed Electronic Signature(s) Signed: 07/18/2023 4:51:37 PM By: Midge Aver MSN RN CNS WTA Signed: 07/18/2023 5:26:58 PM By: Allen Derry PA-C Entered By: Midge Aver on 07/18/2023 11:22:54 [name] ([id number]) [id number] Page 6 of 10 -------------------------------------------------------------------------------- Problem List Details Patient Name: Date of Service: [name], [name] 07/18/2023 1:15 PM Medical Record Number: [id number] Patient Account Number: [id number] Date of Birth/Sex: Treating RN: 06/17/1941 (82 y.o. 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 06/20/2023 No Yes I10 Essential (primary) hypertension  06/20/2023 No Yes I73.89 Charles specified peripheral vascular diseases 06/20/2023 No Yes I25.10 Atherosclerotic heart disease of native coronary artery without angina pectoris 06/20/2023 No Yes Z79.01 Long term (current) use of anticoagulants 06/20/2023 No Yes 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, stage 3 unspecified 06/20/2023 No Yes Inactive Problems Resolved Problems Electronic Signature(s) Signed: 07/18/2023 1:42:22 PM By: Allen Derry PA-C Entered By: Allen Derry on 07/18/2023 10:42:22 [name] ([id number]) [id number] Page 7 of 10 -------------------------------------------------------------------------------- Progress Note Details Patient Name: Date of Service: [name], [name] 07/18/2023 1:15 PM Medical Record Number: [id number] Patient Account Number: [id number] Date of Birth/Sex: Treating RN: 08/15/1941 (82 y.o. [name] Primary Care Provider: Debbra [name] Charles Clinician: Referring Provider: Treating Provider/Extender: Jasmine Pang in Treatment: 4 Subjective Chief Complaint Information obtained from Patient Right foot ulcers History of Present Illness (  HPI) Chronic/Inactive Condition: Notes from Vascular 05/21/23: Right below-the-knee popliteal artery to peroneal artery bypass with reverse saphenous vein graft. This was done on 04/11/2023. Noninvasive studies in our an ABI 0.61 on the right and 0.67 on the left. T oday arterial duplexes were done which shows biphasic/triphasic waveforms throughout the lower extremities down to the level of the tibial vessels. 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 This is suggestive that the patient's bypass is patent although it is not there is no noted occlusion on duplex 1. Atherosclerosis of native arteries of the extremities with ulceration (HCC) Based on  noninvasive studies today appears that the patient would have possible marginal ability for wound healing. Concerned that some of the patient is usually a small vessel disease which may proved to have a little bit more difficult. Will have the patient return in several weeks to see if there has been improvement. They are advised that if the wound deteriorates or begins to show signs of starting to contact us for sooner follow-up. 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 With that being said that area is somewhat deep to be perfectly honest. It does have a little bit concerned. The remaining areas all came from the boot he was wearing and areas that rubbed they have gotten rid of the boot this will not happen again as far as that is concerned. With that being said the patient has had vascular intervention and the note is above as documented in the vascular note. With that being said it does appear that the patient has what is termed "possible marginal ability for wound healing". I think this is basically something this can be quite difficult for [place] to completely close. I am not seeing them possible but again there is a long road ahead. Patient does have a history of diabetes mellitus type 2, hypertension, peripheral vascular disease, coronary artery disease, long-term use of anticoagulants, chronic kidney disease stage III, and as of right now there is no direct evidence of continued and ongoing osteomyelitis although I do question whether or not there may be an issue here. 06-27-2023 upon evaluation today patient appears to be doing okay in regard to his wounds. He is showing signs of some improvement which is good news although this appears to be a little bit too moist in regard to the wounds on his feet in general. I do believe that  he would benefit from a switching dressings to silver alginate away from the Xeroform at this point unfortunately. With that being said the original wound for which she was referred actually does appear to be doing okay at this point in my opinion. I am actually somewhat pleased with where we stand in that regard. I do not see any evidence of active infection at this time which is good. I think the Dakin's moistened gauze is helping. 07-04-2023 upon evaluation today patient appears to be doing well currently in regard to his wounds all things considered. I do believe that he is making headway towards some closure and this is good news. Fortunately I do not see any evidence of active infection looking or systemically which is great news as well. No fevers, chills, nausea, vomiting, or diarrhea. 07-11-2023 upon evaluation today patient appears to  be doing well currently in regard to his wound. He has primary wound at the amputation site is better and the Charles wounds are all showing signs of improvement as well the worst being the right lateral foot but even that seems better with the Betadine that we switch to last week. 07-18-2023 upon evaluation today patient appears to be doing well currently in regard to his wound. He has been tolerating the dressing changes without complication. Fortunately I do not see any signs of active infection at this time. Objective Constitutional Well-nourished and well-hydrated in no acute distress. Vitals Time Taken: 1:48 PM, Height: 68 in, Weight: 167 lbs, BMI: 25.4, Temperature: 97.9 F, Pulse: 67 bpm, Respiratory Rate: 16 breaths/min, Blood Pressure: 160/59 mmHg. Respiratory GAINES, [name] ([id number]) [id number] Page 8 of 10 normal breathing without difficulty. Psychiatric this patient is able to make decisions and demonstrates good insight into disease process. Alert and Oriented x 3. pleasant and cooperative. General Notes: Upon  inspection patient's wound bed actually showed signs of having some dead tissue noted at all wound sites again I think 1 is healed on the medial portion of his foot but the Charles areas are still open at this point and specifically the amputation site is showing signs of cleaning up it is not as deep I do not feel bone in the base of the wound this is actually a dramatic improvement. Integumentary (Hair, Skin) Wound #1 status is Open. Original cause of wound was Surgical Injury. The date acquired was: 04/16/2023. The wound has been in treatment 4 weeks. The wound is located on the Right Amputation Site - T The wound measures 1cm length x 1cm width x 1.5cm depth; 0.785cm^2 area and 1.178cm^3 volume. oe. There is Fat Layer (Subcutaneous Tissue) exposed. There is a medium amount of serosanguineous drainage noted. There is medium (34-66%) pink granulation within the wound bed. Wound #2 status is Open. Original cause of wound was Footwear Injury. The date acquired was: 06/16/2023. The wound has been in treatment 4 weeks. The wound is located on the Right,Plantar Foot. The wound measures 1cm length x 1cm width x 0.1cm depth; 0.785cm^2 area and 0.079cm^3 volume. There is a medium amount of serosanguineous drainage noted. There is medium (34-66%) red, pink granulation within the wound bed. There is no necrotic tissue within the wound bed. Wound #3 status is Open. Original cause of wound was Footwear Injury. The date acquired was: 06/16/2023. The wound has been in treatment 4 weeks. The wound is located on the Right,Lateral Foot. The wound measures 1.7cm length x 1.2cm width x 0.1cm depth; 1.602cm^2 area and 0.16cm^3 volume. There is a medium amount of serosanguineous drainage noted. There is medium (34-66%) pink, pale granulation within the wound bed. There is no necrotic tissue within the wound bed. Wound #4 status is Healed - Epithelialized. Original cause of wound was Footwear Injury. The date acquired was:  06/16/2023. The wound has been in treatment 4 weeks. The wound is located on the Right,Medial Foot. The wound measures 0cm length x 0cm width x 0cm depth; 0cm^2 area and 0cm^3 volume. There is Fat Layer (Subcutaneous Tissue) exposed. There is a medium amount of serosanguineous drainage noted. There is small (1-33%) pale granulation within the wound bed. There is no necrotic tissue within the wound bed. 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 There was a Excisional Skin/Subcutaneous Tissue oe Debridement with a total area of 0.78 sq cm performed by Allen Derry, PA-C. With the following instrument(s): Curette to remove Viable and Non-Viable tissue/material. Material removed includes Callus, Subcutaneous Tissue, and Slough after achieving pain control using Lidocaine 4% Topical Solution. No specimens were taken. A time out was conducted at 14:14, prior to the start of the procedure. There was no bleeding. The procedure was tolerated well with a pain level of 0 throughout and a pain level of 0 following the procedure. Post Debridement Measurements: 1cm length x 1cm width x 1.5cm depth; 1.178cm^3 volume. Character of Wound/Ulcer Post Debridement is stable. 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 There was a Selective/Open Wound Skin/Epidermis Debridement with a total area of 1.6 sq cm performed by Allen Derry, PA-C. With the following instrument(s): Curette to remove Viable and Non-Viable tissue/material. Material removed includes Eschar, Slough, Skin: Dermis, and Skin: Epidermis. No specimens were taken. A time out was conducted at 14:14, prior to the start of the procedure. There was no bleeding. The procedure was tolerated well with a pain level of 0 throughout and a pain level of 0 following the procedure. Post Debridement Measurements: 1.7cm length x 1.2cm width x 0.1cm depth; 0.16cm^3 volume. Character of Wound/Ulcer Post Debridement is stable. Severity of Tissue Post Debridement is: Fat layer exposed. Post procedure Diagnosis Wound #3: Same as Pre-Procedure Plan Follow-up Appointments: Return Appointment in 1 week. Bathing/ Shower/ Hygiene: Wash wounds with antibacterial soap and water. May shower; gently cleanse wound with antibacterial soap, rinse and pat dry prior to dressing wounds [name], [name] ([id number]) [id number] Page 9 of 10 Anesthetic (Use 'Patient Medications' Section for Anesthetic Order Entry): Lidocaine applied to wound bed WOUND #1: - Amputation Site - T oe Wound Laterality: Right Cleanser: [employer]ram Ancillary Kit - 15 Day Supply (Generic) 1 x Per Day/30 Days Discharge Instructions: Use supplies as instructed; Kit contains: (15) Saline Bullets; (15) 3x3 Gauze; 15 pr Gloves Cleanser: Dakin 16 (oz) 0.25 1 x Per Day/30 Days Discharge Instructions: Use as directed. Prim Dressing: Gauze (Generic) 1 x Per Day/30 Days ary Discharge Instructions: As directed: dry, moistened with saline or moistened with Dakins Solution Secondary Dressing: Conforming Guaze Roll-Large (Generic) 1 x Per Day/30 Days Discharge Instructions: Apply Conforming Stretch Guaze Bandage as  directed Secured With: Medipore T - 19M Medipore H Soft Cloth Surgical T ape ape, 2x2 (in/yd) (Generic) 1 x Per Day/30 Days WOUND #2: - Foot Wound Laterality: Plantar, Right Cleanser: Soap and Water 3 x Per Week/30 Days Discharge Instructions: Gently cleanse wound with antibacterial soap, rinse and pat dry prior to dressing wounds Prim Dressing: IODOFLEX 0.9% Cadexomer Iodine Pad (DME) (Generic) 3 x Per Week/30 Days ary Discharge Instructions: Apply Iodoflex to wound bed only as directed. Secondary Dressing: ABD Pad 5x9 (in/in) (DME) (Generic) 3 x Per Week/30 Days Discharge Instructions: Cover with ABD pad Secured With: Medipore T - 19M Medipore H Soft Cloth Surgical T ape ape, 2x2 (in/yd) (Generic) 3 x Per Week/30  Days Secured With: [employer] or Non-Sterile 6-ply 4.5x4 (yd/yd) (Generic) 3 x Per Week/30 Days Discharge Instructions: Apply Kerlix as directed WOUND #3: - Foot Wound Laterality: Right, Lateral Cleanser: Soap and Water 3 x Per Week/30 Days Discharge Instructions: Gently cleanse wound with antibacterial soap, rinse and pat dry prior to dressing wounds Prim Dressing: IODOFLEX 0.9% Cadexomer Iodine Pad (DME) (Generic) 3 x Per Week/30 Days ary Discharge Instructions: Apply Iodoflex to wound bed only as directed. Secondary Dressing: ABD Pad 5x9 (in/in) (DME) (Generic) 3 x Per Week/30 Days Discharge Instructions: Cover with ABD pad Secured With: Medipore T - 87M Medipore H Soft Cloth Surgical T ape ape, 2x2 (in/yd) (DME) (Generic) 3 x Per Week/30 Days Secured With: [employer] Sterile or Non-Sterile 6-ply 4.5x4 (yd/yd) (DME) (Generic) 3 x Per Week/30 Days Discharge Instructions: Apply Kerlix as directed 1. I would recommend based on what we are seeing that we have the patient go ahead and continue to monitor for any signs of infection or worsening. I do believe that he is making good headway here towards closure. 2. I am also can recommend that the patient should continue to  change these dressings regularly his daughter-in-law is helping at this point. 3. I am also going to recommend that we continue with Iodoflex to the wound on the lateral portion of the foot and the plantar aspect of the foot the amputation site we will continue with the Dakin's moistened gauze packing. We will see patient back for reevaluation in 1 week here in the clinic. If anything worsens or changes patient will contact our office for additional recommendations. Electronic Signature(s) Signed: 07/18/2023 2:25:04 PM By: Allen Derry PA-C Entered By: Allen Derry on 07/18/2023 11:25:04 -------------------------------------------------------------------------------- SuperBill Details Patient Name: Date of Service: [name], HO [place] RD [name] 07/18/2023 Medical Record Number: [id number] Patient Account Number: [id number] Date of Birth/Sex: Treating RN: 1940-12-08 (82 y.o. 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 Z79.01 Long term (current) use of anticoagulants 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, stage 3 unspecified Facility Procedures : CPT4 Code: 02725366 Description: 11042 - DEB SUBQ TISSUE 20 SQ CM/< ICD-10 Diagnosis Description L97.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 ulcer of Charles part of right foot with fat layer  exposed Modifier: Quantity: 1 : CPT4 Code: 44034742 Description: 97597 - DEBRIDE WOUND 1ST 20 SQ CM OR < ICD-10 Diagnosis Description 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 Modifier: Quantity: 1 Physician Procedures : CPT4 Code Description Modifier [id number] 11042 - WC PHYS SUBQ TISS 20 SQ CM ICD-10 Diagnosis Description 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

## 2023-07-23 ENCOUNTER — Ambulatory Visit: Payer: Medicare Other

## 2023-07-25 ENCOUNTER — Encounter: Payer: Medicare Other | Attending: Physician Assistant | Admitting: Physician Assistant

## 2023-07-25 ENCOUNTER — Ambulatory Visit: Payer: Medicare Other

## 2023-07-25 DIAGNOSIS — I2510 Atherosclerotic heart disease of native coronary artery without angina pectoris: Secondary | ICD-10-CM | POA: Diagnosis not present

## 2023-07-25 DIAGNOSIS — N183 Chronic kidney disease, stage 3 unspecified: Secondary | ICD-10-CM | POA: Insufficient documentation

## 2023-07-25 DIAGNOSIS — E1151 Type 2 diabetes mellitus with diabetic peripheral angiopathy without gangrene: Secondary | ICD-10-CM | POA: Diagnosis not present

## 2023-07-25 DIAGNOSIS — T8131XA Disruption of external operation (surgical) wound, not elsewhere classified, initial encounter: Secondary | ICD-10-CM | POA: Diagnosis present

## 2023-07-25 DIAGNOSIS — L97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 ulcer of other part of right foot with fat layer exposed: Secondary | ICD-10-CM | POA: Insufficient documentation

## 2023-07-25 DIAGNOSIS — E11621 Type 2 diabetes mellitus with foot ulcer: Secondary | ICD-10-CM | POA: Insufficient documentation

## 2023-07-25 DIAGNOSIS — E1122 Type 2 diabetes mellitus with diabetic chronic kidney disease: Secondary | ICD-10-CM | POA: Diagnosis not present

## 2023-07-25 DIAGNOSIS — Z7901 Long term (current) use of anticoagulants: Secondary | ICD-10-CM | POA: Insufficient documentation

## 2023-07-25 DIAGNOSIS — I130 Hypertensive heart and chronic kidney disease with heart failure and stage 1 through stage 4 chronic kidney disease, or unspecified chronic kidney disease: Secondary | ICD-10-CM | POA: Diagnosis not present

## 2023-07-25 DIAGNOSIS — Y839 Surgical procedure, unspecified as the cause of abnormal reaction of the patient, or of later complication, without mention of misadventure at the time of the procedure: Secondary | ICD-10-CM | POA: Insufficient documentation

## 2023-07-25 NOTE — Progress Notes (Addendum)
AVROHOM, MCKELVIN ([id number]) [id number] Page 1 of 10 Visit Report for 07/25/2023 Chief Complaint Document Details Patient Name: Date of Service: [name], [place] RD [name] 07/25/2023 7:45 A M Medical Record Number: [id number] Patient Account Number: [id number] Date of Birth/Sex: Treating RN: [birth year] (82 y.o. [name] Primary Care Provider: Debbra Riding Other Clinician: Referring Provider: Treating Provider/Extender: Jasmine Pang in Treatment: 5 Information Obtained from: Patient Chief Complaint Right foot ulcers Electronic Signature(s) Signed: 07/25/2023 8:02:29 AM By: Allen Derry PA-C Entered By: Allen Derry on 07/25/2023 08:02:29 -------------------------------------------------------------------------------- Debridement Details Patient Name: Date of Service: [name], HO [place] RD [name] 07/25/2023 7:45 A M Medical Record Number: [id number] Patient Account Number: [id number] Date of Birth/Sex: Treating RN: 01-10-1941 (82 y.o. [name] Primary Care Provider: Debbra Riding Other Clinician: Referring Provider: Treating Provider/Extender: Jasmine Pang in Treatment: 5 Debridement Performed for Assessment: Wound #2 Right,Plantar Foot Performed By: Physician Allen Derry, PA-C Debridement Type: Debridement Severity of Tissue Pre Debridement: Fat layer exposed Level of Consciousness (Pre-procedure): Awake and Alert Pre-procedure Verification/Time Out Yes - 08:15 Taken: Start Time: 08:15 Percent of Wound Bed Debrided: 100% T Area Debrided (cm): otal 0.78 Tissue and other material debrided: Viable, Non-Viable, Eschar, Slough, Subcutaneous, Slough Level: Skin/Subcutaneous Tissue Debridement Description: Excisional Instrument: Curette Bleeding: Minimum Hemostasis Achieved: Pressure Procedural Pain: 0 Post Procedural Pain: 0 Response to Treatment: Procedure was tolerated well [name], [name] ([id number])  [id number] Page 2 of 10 Level of Consciousness (Post- Awake and Alert procedure): Post Debridement Measurements of Total Wound Length: (cm) 1 Width: (cm) 1 Depth: (cm) 0.1 Volume: (cm) 0.079 Character of Wound/Ulcer Post Debridement: Stable Severity of Tissue Post Debridement: Fat layer exposed Post Procedure Diagnosis Same as Pre-procedure Electronic Signature(s) Signed: 07/25/2023 11:25:01 AM By: Allen Derry PA-C Signed: 07/25/2023 4:24:45 PM By: Midge Aver MSN RN CNS WTA Entered By: Midge Aver on 07/25/2023 08:18:39 -------------------------------------------------------------------------------- Debridement Details Patient Name: Date of Service: [name], HO [place] RD [name] 07/25/2023 7:45 A M Medical Record Number: [id number] Patient Account Number: [id number] Date of Birth/Sex: Treating RN: May 31, 1941 (82 y.o. [name] Primary Care Provider: Debbra Riding Other Clinician: Referring Provider: Treating Provider/Extender: Jasmine Pang in Treatment: 5 Debridement Performed for Assessment: Wound #3 Right,Lateral Foot Performed By: Physician Allen Derry, PA-C The following information was scribed by: Midge Aver The information was scribed for: Allen Derry Debridement Type: Debridement Severity of Tissue Pre Debridement: Fat layer exposed Level of Consciousness (Pre-procedure): Awake and Alert Pre-procedure Verification/Time Out Yes - 08:15 Taken: Start Time: 08:15 Percent of Wound Bed Debrided: 100% T Area Debrided (cm): otal 1.63 Tissue and other material debrided: Viable, Non-Viable, Eschar, Slough, Subcutaneous, Skin: Dermis , Skin: Epidermis, Slough Level: Skin/Subcutaneous Tissue Debridement Description: Excisional Instrument: Curette Bleeding: Minimum Hemostasis Achieved: Pressure Procedural Pain: 0 Post Procedural Pain: 0 Response to Treatment: Procedure was tolerated well Level of Consciousness (Post- Awake  and Alert procedure): Post Debridement Measurements of Total Wound Length: (cm) 1.6 Width: (cm) 1.3 Depth: (cm) 0.1 Volume: (cm) 0.163 Character of Wound/Ulcer Post Debridement: Stable Severity of Tissue Post Debridement: Fat layer exposed Post Procedure Diagnosis Same as [name], [name] ([id number]) [id number] Page 3 of 10 Electronic Signature(s) Signed: 07/25/2023 11:25:01 AM By: Allen Derry PA-C Signed: 07/25/2023 4:24:45 PM By: Midge Aver MSN RN CNS WTA Entered By: Midge Aver on 07/25/2023 08:19:49 -------------------------------------------------------------------------------- HPI Details Patient Name: Date of Service: [name], HO [place] RD [name] 07/25/2023 7:45 A M Medical Record  Number: [id number] Patient Account Number: [id number] Date of Birth/Sex: Treating RN: 04-18-1941 (82 y.o. [name] Primary Care Provider: Debbra Riding Other Clinician: Referring Provider: Treating Provider/Extender: Jasmine Pang in Treatment: 5 History of Present Illness Chronic/Inactive Conditions Condition 1: Notes from Vascular 05/21/23: Right below-the-knee popliteal artery to peroneal artery bypass with reverse saphenous vein graft. This was done on 04/11/2023. Noninvasive studies in our an ABI 0.61 on the right and 0.67 on the left. T oday arterial duplexes were done which shows biphasic/triphasic waveforms throughout the lower extremities down to the level of the tibial vessels. 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 This is suggestive that the patient's bypass is patent although it is not there is no noted occlusion on duplex 1. Atherosclerosis of native arteries of the extremities with ulceration (HCC) Based on noninvasive studies today appears that the patient would have possible marginal ability for wound  healing. Concerned that some of the patient is usually a small vessel disease which may proved to have a little bit more difficult. Will have the patient return in several weeks to see if there has been improvement. They are advised that if the wound deteriorates or begins to show signs of starting to contact us for sooner follow-up. 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 With that being said that area is somewhat deep to be perfectly honest. It does have a little bit concerned. The remaining areas all came from the boot he was wearing and areas that rubbed they have gotten rid of the boot this will not happen again as far as that is concerned. With that being said the patient has had vascular intervention and the note is above as documented in the vascular note. With that being said it does appear that the patient has what is termed "possible marginal ability for wound healing". I think this is basically something this can be quite difficult for [place] to completely close. I am not seeing them possible but again there is a long road ahead. Patient does have a history of diabetes mellitus type 2, hypertension, peripheral vascular disease, coronary artery disease, long-term use of anticoagulants, chronic kidney disease stage III, and as of right now there is no direct evidence of continued and ongoing osteomyelitis although I do question whether or not there may be an issue here. 06-27-2023 upon evaluation today patient appears to be doing okay in regard to his wounds. He is showing signs of some improvement which is good news although this appears to be a little bit too moist in regard to the wounds on his feet in general. I do believe that he would benefit from a switching dressings to silver alginate away from the  Xeroform at this point unfortunately. With that being said the original wound for which she was referred actually does appear to be doing okay at this point in my opinion. I am actually somewhat pleased with where we stand in that regard. I do not see any evidence of active infection at this time which is good. I think the Dakin's moistened gauze is helping. 07-04-2023 upon evaluation today patient appears to be doing well currently in regard to his wounds all things considered. I do believe that he is  making headway towards some closure and this is good news. Fortunately I do not see any evidence of active infection looking or systemically which is great news as well. No fevers, chills, nausea, vomiting, or diarrhea. 07-11-2023 upon evaluation today patient appears to be doing well currently in regard to his wound. He has primary wound at the amputation site is better and the other wounds are all showing signs of improvement as well the worst being the right lateral foot but even that seems better with the Betadine that we switch to last week. 07-18-2023 upon evaluation today patient appears to be doing well currently in regard to his wound. He has been tolerating the dressing changes without complication. Fortunately I do not see any signs of active infection at this time. 07-25-2023 upon evaluation today patient appears to be doing well currently in regard to his wound. He has been taught routine the dressing changes and all 3 wounds look better especially the amputation site. Overall I am extremely pleased with where we stand currently. Electronic Signature(s) Signed: 07/25/2023 8:43:11 AM By: Allen Derry PA-C Entered By: Allen Derry on 07/25/2023 08:43:11 [name], [name] [name] ([id number]) [id number] Page 4 of 10 -------------------------------------------------------------------------------- Physical Exam Details Patient Name: Date of Service: [name], [name]  07/25/2023 7:45 A M Medical Record Number: [id number] Patient Account Number: [id number] Date of Birth/Sex: Treating RN: 19-Aug-1941 (82 y.o. [name] Primary Care Provider: Debbra Riding Other Clinician: Referring Provider: Treating Provider/Extender: Jasmine Pang in Treatment: 5 Constitutional Well-nourished and well-hydrated in no acute distress. Respiratory normal breathing without difficulty. Psychiatric this patient is able to make decisions and demonstrates good insight into disease process. Alert and Oriented x 3. pleasant and cooperative. Notes Upon inspection patient's wound bed actually showed signs of good granulation epithelization at this point. Fortunately I do not see any evidence of infection or worsening overall and I believe that the patient is making good headway here towards closure. I did perform debridement on the 2 foot ulcers although the amputation site looks to be doing quite well I did not have to mess with that. Electronic Signature(s) Signed: 07/25/2023 8:43:27 AM By: Allen Derry PA-C Entered By: Allen Derry on 07/25/2023 08:43:27 -------------------------------------------------------------------------------- Physician Orders Details Patient Name: Date of Service: [name], HO [place] RD [name] 07/25/2023 7:45 A M Medical Record Number: [id number] Patient Account Number: [id number] Date of Birth/Sex: Treating RN: Feb 07, 1941 (82 y.o. [name] Primary Care Provider: Debbra Riding Other Clinician: Referring Provider: Treating Provider/Extender: Jasmine Pang in Treatment: 5 The following information was scribed by: Midge Aver The information was scribed for: Allen Derry Verbal / Phone Orders: No Diagnosis Coding ICD-10 Coding Code Description T81.31XA Disruption of external operation (surgical) wound, not elsewhere classified, initial encounter E11.621 Type 2 diabetes mellitus with foot ulcer L97.512  Non-pressure chronic ulcer of other part of right foot with fat layer exposed I10 Essential (primary) hypertension 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 TYJAY, GALINDO ([id number]) [id number] Page 5 of 10 I25.10 Atherosclerotic heart disease of native coronary artery without angina pectoris Z79.01 Long term (current) use of anticoagulants 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 Bathing/ [employer] wounds with antibacterial soap and water. May shower; gently cleanse wound with antibacterial soap, rinse and pat dry prior to dressing wounds Anesthetic (Use 'Patient Medications' Section for Anesthetic Order Entry) Lidocaine applied to wound bed Wound Treatment Wound #1 - Amputation Site - Toe  Wound Laterality: Right Cleanser: [employer]ram Ancillary Kit - 15 Day Supply (Generic) 1 x Per Day/30 Days Discharge Instructions: Use supplies as instructed; Kit contains: (15) Saline Bullets; (15) 3x3 Gauze; 15 pr Gloves Cleanser: Dakin 16 (oz) 0.25 1 x Per Day/30 Days Discharge Instructions: Use as directed. Prim Dressing: Gauze (Generic) 1 x Per Day/30 Days ary Discharge Instructions: As directed: dry, moistened with saline or moistened with Dakins Solution Secondary Dressing: Conforming Guaze Roll-Large (Generic) 1 x Per Day/30 Days Discharge Instructions: Apply Conforming Stretch Guaze Bandage as directed Secured With: Medipore T - 86M Medipore H Soft Cloth Surgical T ape ape, 2x2 (in/yd) (Generic) 1 x Per Day/30 Days Wound #2 - Foot Wound Laterality: Plantar, Right Cleanser: Soap and Water 3 x Per Week/30 Days Discharge Instructions: Gently cleanse wound with antibacterial soap, rinse and pat dry prior to dressing wounds Prim Dressing: IODOFLEX 0.9% Cadexomer Iodine Pad (Generic) 3 x Per Week/30 Days ary Discharge Instructions: Apply Iodoflex to wound bed only as directed. Secondary  Dressing: ABD Pad 5x9 (in/in) (Generic) 3 x Per Week/30 Days Discharge Instructions: Cover with ABD pad Secured With: Medipore T - 86M Medipore H Soft Cloth Surgical T ape ape, 2x2 (in/yd) (Generic) 3 x Per Week/30 Days Secured With: [employer] Sterile or Non-Sterile 6-ply 4.5x4 (yd/yd) (Generic) 3 x Per Week/30 Days Discharge Instructions: Apply Kerlix as directed Wound #3 - Foot Wound Laterality: Right, Lateral Cleanser: Soap and Water 3 x Per Week/30 Days Discharge Instructions: Gently cleanse wound with antibacterial soap, rinse and pat dry prior to dressing wounds Prim Dressing: IODOFLEX 0.9% Cadexomer Iodine Pad (Generic) 3 x Per Week/30 Days ary Discharge Instructions: Apply Iodoflex to wound bed only as directed. Secondary Dressing: ABD Pad 5x9 (in/in) (Generic) 3 x Per Week/30 Days Discharge Instructions: Cover with ABD pad Secured With: Medipore T - 86M Medipore H Soft Cloth Surgical T ape ape, 2x2 (in/yd) (Generic) 3 x Per Week/30 Days Secured With: [employer] Sterile or Non-Sterile 6-ply 4.5x4 (yd/yd) (Generic) 3 x Per Week/30 Days Discharge Instructions: Apply Kerlix as directed Electronic Signature(s) Signed: 07/25/2023 11:25:01 AM By: Allen Derry PA-C Signed: 07/25/2023 4:24:45 PM By: Midge Aver MSN RN CNS WTA Entered By: Midge Aver on 07/25/2023 08:38:05 [name], [name] ([id number]) [id number] Page 6 of 10 -------------------------------------------------------------------------------- Problem List Details Patient Name: Date of Service: [name], [name] 07/25/2023 7:45 A M Medical Record Number: [id number] Patient Account Number: [id number] Date of Birth/Sex: Treating RN: 03/20/1941 (82 y.o. [name] Primary Care Provider: Debbra Riding Other Clinician: Referring Provider: Treating Provider/Extender: Jasmine Pang in Treatment: 5 Active Problems ICD-10 Encounter Code Description Active Date  MDM Diagnosis T81.31XA Disruption of external operation (surgical) wound, not elsewhere classified, 06/20/2023 No Yes initial encounter E11.621 Type 2 diabetes mellitus with foot ulcer 06/20/2023 No Yes L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 06/20/2023 No Yes I10 Essential (primary) hypertension 06/20/2023 No Yes 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 06/20/2023 No Yes I25.10 Atherosclerotic heart disease of native coronary artery without angina pectoris 06/20/2023 No Yes Z79.01 Long term (current) use of anticoagulants 06/20/2023 No Yes 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, stage 3 unspecified 06/20/2023 No Yes Inactive Problems Resolved Problems Electronic Signature(s) Signed: 07/25/2023 8:02:25 AM By: Allen Derry PA-C Entered By: Allen Derry on 07/25/2023 08:02:24 [place] [name] ([id number]) [id number] Page 7 of 10 -------------------------------------------------------------------------------- Progress Note Details Patient Name: Date of Service: [name], [name] 07/25/2023 7:45 A M  Medical Record Number: [id number] Patient Account Number: [id number] Date of Birth/Sex: Treating RN: 1941-07-21 (82 y.o. [name] Primary Care Provider: Debbra Riding Other Clinician: Referring Provider: Treating Provider/Extender: Jasmine Pang in Treatment: 5 Subjective Chief Complaint Information obtained from Patient Right foot ulcers History of Present Illness (HPI) Chronic/Inactive Condition: Notes from Vascular 05/21/23: Right below-the-knee popliteal artery to peroneal artery bypass with reverse saphenous vein graft. This was done on 04/11/2023. Noninvasive studies in our an ABI 0.61 on the right and 0.67 on the left. T oday arterial duplexes were done which shows biphasic/triphasic waveforms throughout the lower extremities down to the level of the tibial vessels. The patient has  occlusion of the bilateral anterior tibial arteries with monophasic waveforms in the deep peroneal and posterior tibial arteries bilaterally. This is suggestive that the patient's bypass is patent although it is not there is no noted occlusion on duplex 1. Atherosclerosis of native arteries of the extremities with ulceration (HCC) Based on noninvasive studies today appears that the patient would have possible marginal ability for wound healing. Concerned that some of the patient is usually a small vessel disease which may proved to have a little bit more difficult. Will have the patient return in several weeks to see if there has been improvement. They are advised that if the wound deteriorates or begins to show signs of starting to contact us for sooner follow-up. 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 With that being said that area is somewhat deep to be perfectly honest. It does have a little bit concerned. The remaining areas all came from the boot he was wearing and areas that rubbed they have gotten rid of the boot this will not happen again as far as that is concerned. With that being said the patient has had vascular intervention and the note is above as documented in the vascular note. With that being said it does appear that the patient has what is termed "possible marginal ability for wound healing". I think this is basically something this can be quite difficult for [place] to completely close. I am not seeing them possible but again there is a long road ahead. Patient does have a history of diabetes mellitus type 2, hypertension, peripheral vascular disease, coronary artery disease, long-term use of anticoagulants, chronic kidney disease stage III, and as of right now there is no direct evidence of continued and ongoing  osteomyelitis although I do question whether or not there may be an issue here. 06-27-2023 upon evaluation today patient appears to be doing okay in regard to his wounds. He is showing signs of some improvement which is good news although this appears to be a little bit too moist in regard to the wounds on his feet in general. I do believe that he would benefit from a switching dressings to silver alginate away from the Xeroform at this point unfortunately. With that being said the original wound for which she was referred actually does appear to be doing okay at this point in my opinion. I am actually somewhat pleased with where we stand in that regard. I do not see any evidence of active infection at this time which is good. I think the Dakin's moistened gauze is helping. 07-04-2023 upon evaluation today patient appears to be doing well currently in regard to his wounds  all things considered. I do believe that he is making headway towards some closure and this is good news. Fortunately I do not see any evidence of active infection looking or systemically which is great news as well. No fevers, chills, nausea, vomiting, or diarrhea. 07-11-2023 upon evaluation today patient appears to be doing well currently in regard to his wound. He has primary wound at the amputation site is better and the other wounds are all showing signs of improvement as well the worst being the right lateral foot but even that seems better with the Betadine that we switch to last week. 07-18-2023 upon evaluation today patient appears to be doing well currently in regard to his wound. He has been tolerating the dressing changes without complication. Fortunately I do not see any signs of active infection at this time. 07-25-2023 upon evaluation today patient appears to be doing well currently in regard to his wound. He has been taught routine the dressing changes and all 3 wounds look better especially the amputation site.  Overall I am extremely pleased with where we stand currently. Objective Constitutional Well-nourished and well-hydrated in no acute distress. Vitals Time Taken: 7:48 AM, Height: 68 in, Weight: 167 lbs, BMI: 25.4, Temperature: 98.2 F, Pulse: 71 bpm, Respiratory Rate: 16 breaths/min, Blood Pressure: 154/53 mmHg. [name], [name] ([id number]) [id number] Page 8 of 10 Respiratory normal breathing without difficulty. Psychiatric this patient is able to make decisions and demonstrates good insight into disease process. Alert and Oriented x 3. pleasant and cooperative. General Notes: Upon inspection patient's wound bed actually showed signs of good granulation epithelization at this point. Fortunately I do not see any evidence of infection or worsening overall and I believe that the patient is making good headway here towards closure. I did perform debridement on the 2 foot ulcers although the amputation site looks to be doing quite well I did not have to mess with that. Integumentary (Hair, Skin) Wound #1 status is Open. Original cause of wound was Surgical Injury. The date acquired was: 04/16/2023. The wound has been in treatment 5 weeks. The wound is located on the Right Amputation Site - T The wound measures 1cm length x 0.8cm width x 1.4cm depth; 0.628cm^2 area and 0.88cm^3 volume. oe. There is Fat Layer (Subcutaneous Tissue) exposed. There is a medium amount of serosanguineous drainage noted. There is medium (34-66%) pink granulation within the wound bed. Wound #2 status is Open. Original cause of wound was Footwear Injury. The date acquired was: 06/16/2023. The wound has been in treatment 5 weeks. The wound is located on the Right,Plantar Foot. The wound measures 1cm length x 1cm width x 0.1cm depth; 0.785cm^2 area and 0.079cm^3 volume. There is a medium amount of serosanguineous drainage noted. There is medium (34-66%) red, pink granulation within the wound bed. There  is no necrotic tissue within the wound bed. Wound #3 status is Open. Original cause of wound was Footwear Injury. The date acquired was: 06/16/2023. The wound has been in treatment 5 weeks. The wound is located on the Right,Lateral Foot. The wound measures 1.6cm length x 1.3cm width x 0.1cm depth; 1.634cm^2 area and 0.163cm^3 volume. There is a medium amount of serosanguineous drainage noted. There is medium (34-66%) pink, pale granulation within the wound bed. There is no necrotic tissue within the wound bed. 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 There was a Excisional Skin/Subcutaneous Tissue Debridement with a total area of 0.78 sq cm performed by Allen Derry, PA-C. With the following instrument(s): Curette to remove Viable and Non-Viable tissue/material. Material removed includes Eschar, Subcutaneous Tissue, and Slough. No specimens were taken. A time out was conducted at 08:15, prior to the start of the procedure. A Minimum amount of bleeding was controlled with Pressure. The procedure was tolerated well with a pain level of 0 throughout and a pain level of 0 following the procedure. Post Debridement Measurements: 1cm length x 1cm width x 0.1cm depth; 0.079cm^3 volume. Character of Wound/Ulcer Post Debridement is stable. Severity of Tissue Post  Debridement is: Fat layer exposed. Post procedure Diagnosis Wound #2: Same as Pre-Procedure Wound #3 Pre-procedure diagnosis of Wound #3 is a Diabetic Wound/Ulcer of the Lower Extremity located on the Right,Lateral Foot .Severity of Tissue Pre Debridement is: Fat layer exposed. There was a Excisional Skin/Subcutaneous Tissue Debridement with a total area of 1.63 sq cm performed by Allen Derry, PA-C. With the following instrument(s): Curette to remove Viable and Non-Viable tissue/material. Material removed includes Eschar, Subcutaneous Tissue, Slough, Skin: Dermis, and Skin: Epidermis. No specimens were taken. A time out was conducted at 08:15, prior to the start of the procedure. A Minimum amount of bleeding was controlled with Pressure. The procedure was tolerated well with a pain level of 0 throughout and a pain level of 0 following the procedure. Post Debridement Measurements: 1.6cm length x 1.3cm width x 0.1cm depth; 0.163cm^3 volume. Character of Wound/Ulcer Post Debridement is stable. Severity of Tissue Post Debridement is: Fat layer exposed. Post procedure Diagnosis Wound #3: Same as Pre-Procedure Plan Follow-up Appointments: Return Appointment in 1 week. Bathing/ Shower/ Hygiene: Wash wounds with antibacterial soap and water. May shower; gently cleanse wound with antibacterial soap, rinse and pat dry prior to dressing wounds Anesthetic (Use 'Patient Medications' Section for Anesthetic Order Entry): Lidocaine applied to wound bed [name], [name] ([id number]) [id number] Page 9 of 10 WOUND #1: - Amputation Site - Toe Wound Laterality: Right Cleanser: [employer]ram Ancillary Kit - 15 Day Supply (Generic) 1 x Per Day/30 Days Discharge Instructions: Use supplies as instructed; Kit contains: (15) Saline Bullets; (15) 3x3 Gauze; 15 pr Gloves Cleanser: Dakin 16 (oz) 0.25 1 x Per Day/30 Days Discharge Instructions: Use as directed. Prim Dressing: Gauze (Generic) 1 x Per  Day/30 Days ary Discharge Instructions: As directed: dry, moistened with saline or moistened with Dakins Solution Secondary Dressing: Conforming Guaze Roll-Large (Generic) 1 x Per Day/30 Days Discharge Instructions: Apply Conforming Stretch Guaze Bandage as directed Secured With: Medipore T - 19M Medipore H Soft Cloth Surgical T ape ape, 2x2 (in/yd) (Generic) 1 x Per Day/30 Days WOUND #2: - Foot Wound Laterality: Plantar, Right Cleanser: Soap and Water 3 x Per Week/30 Days Discharge Instructions: Gently cleanse wound with antibacterial soap, rinse and pat dry prior to dressing wounds Prim Dressing: IODOFLEX 0.9% Cadexomer Iodine Pad (Generic) 3 x Per Week/30 Days ary Discharge Instructions: Apply Iodoflex to wound bed only as directed. Secondary Dressing: ABD Pad 5x9 (in/in) (Generic) 3 x Per Week/30 Days Discharge Instructions: Cover with ABD pad Secured  With: Medipore T - 29M Medipore H Soft Cloth Surgical T ape ape, 2x2 (in/yd) (Generic) 3 x Per Week/30 Days Secured With: [employer] Sterile or Non-Sterile 6-ply 4.5x4 (yd/yd) (Generic) 3 x Per Week/30 Days Discharge Instructions: Apply Kerlix as directed WOUND #3: - Foot Wound Laterality: Right, Lateral Cleanser: Soap and Water 3 x Per Week/30 Days Discharge Instructions: Gently cleanse wound with antibacterial soap, rinse and pat dry prior to dressing wounds Prim Dressing: IODOFLEX 0.9% Cadexomer Iodine Pad (Generic) 3 x Per Week/30 Days ary Discharge Instructions: Apply Iodoflex to wound bed only as directed. Secondary Dressing: ABD Pad 5x9 (in/in) (Generic) 3 x Per Week/30 Days Discharge Instructions: Cover with ABD pad Secured With: Medipore T - 29M Medipore H Soft Cloth Surgical T ape ape, 2x2 (in/yd) (Generic) 3 x Per Week/30 Days Secured With: [employer] Sterile or Non-Sterile 6-ply 4.5x4 (yd/yd) (Generic) 3 x Per Week/30 Days Discharge Instructions: Apply Kerlix as directed 1. I would recommend that we have the patient going  continue to monitor for any signs of infection or worsening. Based on what I am seeing I do believe that the patient is doing quite well. 2. I am good recommend as well the patient should continue with the Iodoflex to both foot wounds and we will continue with the packing to the surgical site. We will see patient back for reevaluation in 1 week here in the clinic. If anything worsens or changes patient will contact our office for additional recommendations. Electronic Signature(s) Signed: 07/25/2023 8:43:52 AM By: Allen Derry PA-C Entered By: Allen Derry on 07/25/2023 08:43:52 -------------------------------------------------------------------------------- SuperBill Details Patient Name: Date of Service: [name], HO [place] RD [name] 07/25/2023 Medical Record Number: [id number] Patient Account Number: [id number] Date of Birth/Sex: Treating RN: [birth year] (82 y.o. 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 Z79.01 Long term (current) use of anticoagulants [name], [name] ([id number]) [id number] Page 10 of 10 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, stage 3 unspecified Facility Procedures : CPT4 Code: [id number] Description: 11042 - DEB SUBQ TISSUE 20 SQ CM/< ICD-10 Diagnosis Description L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 Modifier: Quantity:  1 Physician Procedures : CPT4 Code Description Modifier [id number] 11042 - WC PHYS SUBQ TISS 20 SQ CM ICD-10 Diagnosis Description L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 Quantity: 1 Electronic Signature(s) Signed: 07/25/2023 8:44:13 AM By: Allen Derry PA-C Entered By: Allen Derry on 07/25/2023 08:44:13

## 2023-07-25 NOTE — Progress Notes (Addendum)
[name] ([id number]) [id number] Page 1 of 10 Visit Report for 07/25/2023 Arrival Information Details Patient Name: Date of Service: [name], [name] RD Charles. 07/25/2023 7:45 A M Medical Record Number: [id number] Patient Account Number: [id number] Date of Birth/Sex: Treating RN: Jun 27, 1941 (82 y.o. [name] Primary Care Madalin Hughart: Debbra Riding Other Clinician: Referring Karlina Suares: Treating Zeyad Delaguila/Extender: Jasmine Pang in Treatment: 5 Visit Information History Since Last Visit Added or deleted any medications: No Patient Arrived: [name] Any new allergies or adverse reactions: No Arrival Time: 07:43 Has Dressing in Place as Prescribed: Yes Accompanied By: daughter in law Pain Present Now: No Transfer Assistance: None Patient Identification Verified: Yes Secondary Verification Process Completed: Yes Patient Requires Transmission-Based Precautions: No Patient Has Alerts: Yes Patient Alerts: Patient on Blood Thinner diabetes type 2 Plavix Electronic Signature(s) Signed: 07/25/2023 4:24:45 PM By: Midge Aver MSN RN CNS WTA Entered By: Midge Aver on 07/25/2023 04:48:38 -------------------------------------------------------------------------------- Clinic Level of Care Assessment Details Patient Name: Date of Service: [name], [name] RD Charles. 07/25/2023 7:45 A M Medical Record Number: [id number] Patient Account Number: [id number] Date of Birth/Sex: Treating RN: Nov 15, 1940 (82 y.o. [name] Primary Care Lorijean Husser: Debbra Riding Other Clinician: Referring Rakia Frayne: Treating Anela Bensman/Extender: Jasmine Pang in Treatment: 5 Clinic Level of Care Assessment Items TOOL 1 Quantity Score []  - 0 Use when EandM and Procedure is performed on INITIAL visit ASSESSMENTS - Nursing Assessment / Reassessment []  - 0 General Physical Exam (combine w/ comprehensive assessment (listed just below) when performed on  new pt. evals) []  - 0 Comprehensive Assessment (HX, ROS, Risk Assessments, Wounds Hx, etc.) ASSESSMENTS - Wound and Skin Assessment / Reassessment []  - 0 Dermatologic / Skin Assessment (not related to wound area) [name], DUFFIE Charles ([id number]) [id number] Page 2 of 10 ASSESSMENTS - Ostomy and/or Continence Assessment and Care []  - 0 Incontinence Assessment and Management []  - 0 Ostomy Care Assessment and Management (repouching, etc.) PROCESS - Coordination of Care []  - 0 Simple Patient / Family Education for ongoing care []  - 0 Complex (extensive) Patient / Family Education for ongoing care []  - 0 Staff obtains Chiropractor, Records, T Results / Process Orders est []  - 0 Staff telephones HHA, Nursing Homes / Clarify orders / etc []  - 0 Routine Transfer to another Facility (non-emergent condition) []  - 0 Routine Hospital Admission (non-emergent condition) []  - 0 New Admissions / Manufacturing engineer / Ordering NPWT Apligraf, etc. , []  - 0 Emergency Hospital Admission (emergent condition) PROCESS - Special Needs []  - 0 Pediatric / Minor Patient Management []  - 0 Isolation Patient Management []  - 0 Hearing / Language / Visual special needs []  - 0 Assessment of Community assistance (transportation, Charles/C planning, etc.) []  - 0 Additional assistance / Altered mentation []  - 0 Support Surface(s) Assessment (bed, cushion, seat, etc.) INTERVENTIONS - Miscellaneous []  - 0 External ear exam []  - 0 Patient Transfer (multiple staff / Nurse, adult / Similar devices) []  - 0 Simple Staple / Suture removal (25 or less) []  - 0 Complex Staple / Suture removal (26 or more) []  - 0 Hypo/Hyperglycemic Management (do not check if billed separately) []  - 0 Ankle / Brachial Index (ABI) - do not check if billed separately Has the patient been seen at the hospital within the last three years: Yes Total Score: 0 Level Of Care: ____ Electronic Signature(s) Signed:  07/25/2023 4:24:45 PM By: Midge Aver MSN RN CNS WTA Entered By: Midge Aver on 07/25/2023 05:38:26 --------------------------------------------------------------------------------  Encounter Discharge Information Details Patient Name: Date of Service: [name], [name] RD Charles. 07/25/2023 7:45 A M Medical Record Number: [id number] Patient Account Number: [id number] Date of Birth/Sex: Treating RN: 1941-01-03 (82 y.o. [name] Primary Care Sriram Febles: Debbra Riding Other Clinician: Referring Lleyton Byers: Treating Keaden Gunnoe/Extender: Jasmine Pang in Treatment: 5 Encounter Discharge Information Items Post Procedure [name], [name] ([id number]) [id number] Page 3 of 10 Discharge Condition: Stable Temperature (F): 98.2 Ambulatory Status: Cane Pulse (bpm): 71 Discharge Destination: Home Respiratory Rate (breaths/min): 18 Transportation: Private Auto Blood Pressure (mmHg): 154/53 Accompanied By: daughter in law Schedule Follow-up Appointment: Yes Clinical Summary of Care: Electronic Signature(s) Signed: 07/25/2023 4:24:45 PM By: Midge Aver MSN RN CNS WTA Entered By: Midge Aver on 07/25/2023 05:39:59 -------------------------------------------------------------------------------- Lower Extremity Assessment Details Patient Name: Date of Service: [name], [place] RD Charles. 07/25/2023 7:45 A M Medical Record Number: [id number] Patient Account Number: [id number] Date of Birth/Sex: Treating RN: Nov 06, 1940 (82 y.o. [name] Primary Care Cledith Kamiya: Debbra Riding Other Clinician: Referring Francia Verry: Treating Arrietty Dercole/Extender: Jasmine Pang in Treatment: 5 Electronic Signature(s) Signed: 07/25/2023 4:24:45 PM By: Midge Aver MSN RN CNS WTA Entered By: Midge Aver on 07/25/2023 05:04:50 -------------------------------------------------------------------------------- Multi Wound Chart Details Patient Name: Date of  Service: [name], [place] RD Charles. 07/25/2023 7:45 A M Medical Record Number: [id number] Patient Account Number: [id number] Date of Birth/Sex: Treating RN: Oct 03, 1940 (82 y.o. [name] Primary Care Mashal Slavick: Debbra Riding Other Clinician: Referring Barbee Mamula: Treating Latoya Maulding/Extender: Jasmine Pang in Treatment: 5 Vital Signs Height(in): 68 Pulse(bpm): 71 Weight(lbs): 167 Blood Pressure(mmHg): 154/53 Body Mass Index(BMI): 25.4 Temperature(F): 98.2 Respiratory Rate(breaths/min): 16 [1:Photos:] [3:[id number] Page 4 of 10] Right Amputation Site - Toe Right, Plantar Foot Right, Lateral Foot Wound Location: Surgical Injury Footwear Injury Footwear Injury Wounding Event: Dehisced Wound Diabetic Wound/Ulcer of the Lower Diabetic Wound/Ulcer of the Lower Primary Etiology: Extremity Extremity Congestive Heart Failure, Congestive Heart Failure, Congestive Heart Failure, Comorbid History: Hypertension, Peripheral Venous Hypertension, Peripheral Venous Hypertension, Peripheral Venous Disease, Type II Diabetes, Disease, Type II Diabetes, Disease, Type II Diabetes, Neuropathy Neuropathy Neuropathy 04/16/2023 06/16/2023 06/16/2023 Date Acquired: 5 5 5  Weeks of Treatment: Open Open Open Wound Status: No No No Wound Recurrence: 1x0.8x1.4 1x1x0.1 1.6x1.3x0.1 Measurements L x W x Charles (cm) 0.628 0.785 1.634 A (cm) : rea 0.88 0.079 0.163 Volume (cm) : 46.70% -103.90% -4.00% % Reduction in Area: 42.60% -107.90% -3.80% % Reduction in Volume: Full Thickness Without Exposed Grade 1 Grade 2 Classification: Support Structures Medium Medium Medium Exudate Amount: Serosanguineous Serosanguineous Serosanguineous Exudate Type: red, brown red, brown red, brown Exudate Color: Medium (34-66%) Medium (34-66%) Medium (34-66%) Granulation Amount: Pink Red, Pink Pink, Pale Granulation Quality: N/A None Present (0%) None Present (0%) Necrotic  Amount: Fat Layer (Subcutaneous Tissue): Yes Fascia: No Fascia: No Exposed Structures: Fascia: No Fat Layer (Subcutaneous Tissue): No Fat Layer (Subcutaneous Tissue): No Tendon: No Tendon: No Tendon: No Muscle: No Muscle: No Muscle: No Joint: No Joint: No Joint: No Bone: No Bone: No Bone: No None Small (1-33%) None Epithelialization: Treatment Notes Electronic Signature(s) Signed: 07/25/2023 4:24:45 PM By: Midge Aver MSN RN CNS WTA Entered By: Midge Aver on 07/25/2023 05:14:42 -------------------------------------------------------------------------------- Multi-Disciplinary Care Plan Details Patient Name: Date of Service: [name], [name] RD Charles. 07/25/2023 7:45 A M Medical Record Number: [id number] Patient Account Number: [id number] Date of Birth/Sex: Treating RN: April 27, 1941 (82 y.o. [name] Primary Care Osias Resnick: Debbra Riding Other Clinician: Referring  Bradan Congrove: Treating Zaydan Papesh/Extender: Jasmine Pang in Treatment: 5 Active Inactive Wound/Skin Impairment Nursing Diagnoses: Impaired tissue integrity Knowledge deficit related to ulceration/compromised skin integrity ELRIDGE, REASNER ([id number]) [id number] Page 5 of 10 Goals: Patient/caregiver will verbalize understanding of skin care regimen Date Initiated: 06/20/2023 Date Inactivated: 07/18/2023 Target Resolution Date: 07/21/2023 Goal Status: Met Ulcer/skin breakdown will have a volume reduction of 30% by week 4 Date Initiated: 06/20/2023 Date Inactivated: 07/18/2023 Target Resolution Date: 07/21/2023 Goal Status: Met Ulcer/skin breakdown will have a volume reduction of 50% by week 8 Date Initiated: 06/20/2023 Target Resolution Date: 08/20/2023 Goal Status: Active Ulcer/skin breakdown will have a volume reduction of 80% by week 12 Date Initiated: 06/20/2023 Target Resolution Date: 09/20/2023 Goal Status: Active Ulcer/skin breakdown will heal within 14  weeks Date Initiated: 06/20/2023 Target Resolution Date: 10/04/2023 Goal Status: Active Interventions: Assess patient/caregiver ability to obtain necessary supplies Assess patient/caregiver ability to perform ulcer/skin care regimen upon admission and as needed Assess ulceration(s) every visit Provide education on ulcer and skin care Treatment Activities: Referred to DME Malaijah Houchen for dressing supplies : 06/20/2023 Skin care regimen initiated : 06/20/2023 Topical wound management initiated : 06/20/2023 Notes: Electronic Signature(s) Signed: 07/25/2023 4:24:45 PM By: Midge Aver MSN RN CNS WTA Entered By: Midge Aver on 07/25/2023 05:38:43 -------------------------------------------------------------------------------- Pain Assessment Details Patient Name: Date of Service: [name], [place] RD Charles. 07/25/2023 7:45 A M Medical Record Number: [id number] Patient Account Number: [id number] Date of Birth/Sex: Treating RN: Nov 07, 1940 (82 y.o. [name] Primary Care Kaleeyah Cuffie: Debbra Riding Other Clinician: Referring Ariannie Penaloza: Treating Krishang Reading/Extender: Jasmine Pang in Treatment: 5 Active Problems Location of Pain Severity and Description of Pain Patient Has Paino No Site Locations [place], [place] Charles ([id number]) [phone].pdf Page 6 of 10 Pain Management and Medication Current Pain Management: Electronic Signature(s) Signed: 07/25/2023 4:24:45 PM By: Midge Aver MSN RN CNS WTA Entered By: Midge Aver on 07/25/2023 04:51:27 -------------------------------------------------------------------------------- Patient/Caregiver Education Details Patient Name: Date of Service: [name], [name] RD Charles. 11/7/2024andnbsp7:45 A M Medical Record Number: [id number] Patient Account Number: [id number] Date of Birth/Gender: Treating RN: 1941-02-16 (82 y.o. [name] Primary Care Physician: Debbra Riding Other Clinician: Referring Physician: Treating  Physician/Extender: Jasmine Pang in Treatment: 5 Education Assessment Education Provided To: Patient Education Topics Provided Wound/Skin Impairment: Handouts: Caring for Your Ulcer Methods: Explain/Verbal Responses: State content correctly Electronic Signature(s) Signed: 07/25/2023 4:24:45 PM By: Midge Aver MSN RN CNS WTA Entered By: Midge Aver on 07/25/2023 05:38:54 Cammy Brochure ([id number]) [id number] Page 7 of 10 -------------------------------------------------------------------------------- Wound Assessment Details Patient Name: Date of Service: [name], [place] RD Charles. 07/25/2023 7:45 A M Medical Record Number: [id number] Patient Account Number: [id number] Date of Birth/Sex: Treating RN: December 09, 1940 (82 y.o. [name] Primary Care Maurie Musco: Debbra Riding Other Clinician: Referring Piero Mustard: Treating Reeshemah Nazaryan/Extender: Jasmine Pang in Treatment: 5 Wound Status Wound Number: 1 Primary Dehisced Wound Etiology: Wound Location: Right Amputation Site - Toe Wound Open Wounding Event: Surgical Injury Status: Date Acquired: 04/16/2023 Comorbid Congestive Heart Failure, Hypertension, Peripheral Venous Weeks Of Treatment: 5 History: Disease, Type II Diabetes, Neuropathy Clustered Wound: No Photos Wound Measurements Length: (cm) 1 Width: (cm) 0.8 Depth: (cm) 1.4 Area: (cm) 0.628 Volume: (cm) 0.88 % Reduction in Area: 46.7% % Reduction in Volume: 42.6% Epithelialization: None Wound Description Classification: Full Thickness Without Exposed Support Structures Exudate Amount: Medium Exudate Type: Serosanguineous Exudate Color: red, brown Foul Odor After Cleansing: No Slough/Fibrino No Wound Bed Granulation  Amount: Medium (34-66%) Exposed Structure Granulation Quality: Pink Fascia Exposed: No Fat Layer (Subcutaneous Tissue) Exposed: Yes Tendon Exposed: No Muscle Exposed: No Joint Exposed:  No Bone Exposed: No Electronic Signature(s) Signed: 07/25/2023 4:24:45 PM By: Midge Aver MSN RN CNS WTA Entered By: Midge Aver on 07/25/2023 05:03:39 Peter Congo Charles ([id number]) [id number] Page 8 of 10 -------------------------------------------------------------------------------- Wound Assessment Details Patient Name: Date of Service: [name], [place] RD Charles. 07/25/2023 7:45 A M Medical Record Number: [id number] Patient Account Number: [id number] Date of Birth/Sex: Treating RN: 29-Apr-1941 (82 y.o. [name] Primary Care Ravynn Hogate: Debbra Riding Other Clinician: Referring Ersie Savino: Treating Elmond Poehlman/Extender: Jasmine Pang in Treatment: 5 Wound Status Wound Number: 2 Primary Diabetic Wound/Ulcer of the Lower Extremity Etiology: Wound Location: Right, Plantar Foot Wound Open Wounding Event: Footwear Injury Status: Date Acquired: 06/16/2023 Comorbid Congestive Heart Failure, Hypertension, Peripheral Venous Weeks Of Treatment: 5 History: Disease, Type II Diabetes, Neuropathy Clustered Wound: No Photos Wound Measurements Length: (cm) 1 Width: (cm) 1 Depth: (cm) 0.1 Area: (cm) 0.785 Volume: (cm) 0.079 % Reduction in Area: -103.9% % Reduction in Volume: -107.9% Epithelialization: Small (1-33%) Wound Description Classification: Grade 1 Exudate Amount: Medium Exudate Type: Serosanguineous Exudate Color: red, brown Foul Odor After Cleansing: No Slough/Fibrino No Wound Bed Granulation Amount: Medium (34-66%) Exposed Structure Granulation Quality: Red, Pink Fascia Exposed: No Necrotic Amount: None Present (0%) Fat Layer (Subcutaneous Tissue) Exposed: No Tendon Exposed: No Muscle Exposed: No Joint Exposed: No Bone Exposed: No Electronic Signature(s) Signed: 08/01/2023 5:04:40 PM By: Elliot Gurney, BSN, RN, CWS, Kim RN, BSN Signed: 09/13/2023 12:17:45 PM By: Midge Aver MSN RN CNS WTA Previous Signature: 07/25/2023 4:24:45 PM  Version By: Midge Aver MSN RN CNS WTA Entered By: Elliot Gurney, BSN, RN, CWS, Kim on 08/01/2023 14:04:40 WLLIAM, PASINI Charles ([id number]) [id number] Page 9 of 10 -------------------------------------------------------------------------------- Wound Assessment Details Patient Name: Date of Service: [name], [place] RD Charles. 07/25/2023 7:45 A M Medical Record Number: [id number] Patient Account Number: [id number] Date of Birth/Sex: Treating RN: 11-23-1940 (82 y.o. [name] Primary Care Kaniya Trueheart: Debbra Riding Other Clinician: Referring Jyrah Blye: Treating Jalesha Plotz/Extender: Jasmine Pang in Treatment: 5 Wound Status Wound Number: 3 Primary Diabetic Wound/Ulcer of the Lower Extremity Etiology: Wound Location: Right, Lateral Foot Wound Open Wounding Event: Footwear Injury Status: Date Acquired: 06/16/2023 Comorbid Congestive Heart Failure, Hypertension, Peripheral Venous Weeks Of Treatment: 5 History: Disease, Type II Diabetes, Neuropathy Clustered Wound: No Photos Wound Measurements Length: (cm) 1.6 Width: (cm) 1.3 Depth: (cm) 0.1 Area: (cm) 1.634 Volume: (cm) 0.163 % Reduction in Area: -4% % Reduction in Volume: -3.8% Epithelialization: None Wound Description Classification: Grade 2 Exudate Amount: Medium Exudate Type: Serosanguineous Exudate Color: red, brown Foul Odor After Cleansing: No Slough/Fibrino No Wound Bed Granulation Amount: Medium (34-66%) Exposed Structure Granulation Quality: Pink, Pale Fascia Exposed: No Necrotic Amount: None Present (0%) Fat Layer (Subcutaneous Tissue) Exposed: No Tendon Exposed: No Muscle Exposed: No Joint Exposed: No Bone Exposed: No Electronic Signature(s) Signed: 08/01/2023 5:05:21 PM By: Elliot Gurney, BSN, RN, CWS, Kim RN, BSN Signed: 09/13/2023 12:17:45 PM By: Midge Aver MSN RN CNS WTA Previous Signature: 07/25/2023 4:24:45 PM Version By: Midge Aver MSN RN CNS WTA Entered By: Elliot Gurney, BSN,  RN, CWS, Kim on 08/01/2023 14:05:21 ZYKEL, OZMENT Charles ([id number]) [id number] Page 10 of 10 -------------------------------------------------------------------------------- Vitals Details Patient Name: Date of Service: [name], [place] RD Charles. 07/25/2023 7:45 A M Medical Record Number: [id number] Patient Account Number: [id number] Date of Birth/Sex: Treating RN: [birth year] (82 y.o.  [name] Primary Care Taryne Kiger: Debbra Riding Other Clinician: Referring Leone Putman: Treating Belisa Eichholz/Extender: Jasmine Pang in Treatment: 5 Vital Signs Time Taken: 07:48 Temperature (F): 98.2 Height (in): 68 Pulse (bpm): 71 Weight (lbs): 167 Respiratory Rate (breaths/min): 16 Body Mass Index (BMI): 25.4 Blood Pressure (mmHg): 154/53 Reference Range: 80 - 120 mg / dl Electronic Signature(s) Signed: 07/25/2023 4:24:45 PM By: Midge Aver MSN RN CNS WTA Entered By: Midge Aver on 07/25/2023 04:51:20

## 2023-07-30 ENCOUNTER — Ambulatory Visit: Payer: Medicare Other

## 2023-08-01 ENCOUNTER — Ambulatory Visit: Payer: Medicare Other

## 2023-08-01 ENCOUNTER — Encounter: Payer: Medicare Other | Admitting: Physician Assistant

## 2023-08-01 DIAGNOSIS — T8131XA Disruption of external operation (surgical) wound, not elsewhere classified, initial encounter: Secondary | ICD-10-CM | POA: Diagnosis not present

## 2023-08-01 NOTE — Progress Notes (Addendum)
KELSEY, CALVI ([id number]) [id number] Page 1 of 11 Visit Report for 08/01/2023 Chief Complaint Document Details Patient Name: Date of Service: [name], [name] 08/01/2023 1:15 PM Medical Record Number: [id number] Patient Account Number: [id number] Date of Birth/Sex: Treating RN: 01/28/1941 (82 y.o. [name] Primary Care Provider: Debbra Riding Other Clinician: Referring Provider: Treating Provider/Extender: Jasmine Pang in Treatment: 6 Information Obtained from: Patient Chief Complaint Right foot ulcers Electronic Signature(s) Signed: 08/01/2023 1:51:28 PM By: Allen Derry PA-C Entered By: Allen Derry on 08/01/2023 13:51:28 -------------------------------------------------------------------------------- Debridement Details Patient Name: Date of Service: [name], [name] RD D. 08/01/2023 1:15 PM Medical Record Number: [id number] Patient Account Number: [id number] Date of Birth/Sex: Treating RN: 08/01/1941 (82 y.o. 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 Performed By: Physician Allen Derry, PA-C Debridement Type: Debridement Level of Consciousness (Pre-procedure): Awake and Alert Pre-procedure Verification/Time Out Yes - 14:41 Taken: Start Time: 14:41 Percent of Wound Bed Debrided: 100% T Area Debrided (cm): otal 0.41 Tissue and other material debrided: Viable, Non-Viable, Slough, Subcutaneous, Slough Level: Skin/Subcutaneous Tissue Debridement Description: Excisional Instrument: Curette Bleeding: Minimum Hemostasis Achieved: Pressure Response to Treatment: Procedure was tolerated well Level of Consciousness (Post- Awake and Alert procedure): [name], Charles ([id number])  [id number] Page 2 of 11 Post Debridement Measurements of Total Wound Length: (cm) 1.3 Width: (cm) 0.4 Depth: (cm) 1 Volume: (cm) 0.408 Character of Wound/Ulcer Post Debridement: Stable Post Procedure Diagnosis Same as Pre-procedure Electronic Signature(s) Signed: 08/01/2023 5:18:23 PM By: Betha Loa Signed: 08/01/2023 5:33:14 PM By: Allen Derry PA-C Signed: 08/01/2023 5:33:20 PM By: Midge Aver MSN RN CNS WTA Entered By: Betha Loa on 08/01/2023 14:42:53 -------------------------------------------------------------------------------- Debridement Details Patient Name: Date of Service: [name], [place] RD D. 08/01/2023 1:15 PM Medical Record Number: [id number] Patient Account Number: [id number] Date of Birth/Sex: Treating RN: 1940-09-22 (82 y.o. [name] Primary Care Provider: Debbra Riding Other Clinician: Betha Loa Referring Provider: Treating Provider/Extender: Jasmine Pang in Treatment: 6 Debridement Performed for Assessment: Wound #2 Right,Plantar Foot Performed By: Physician Allen Derry, PA-C Debridement Type: Debridement Severity of Tissue Pre Debridement: Fat layer exposed Level of Consciousness (Pre-procedure): Awake and Alert Pre-procedure Verification/Time Out Yes - 14:41 Taken: Start Time: 14:41 Percent of Wound Bed Debrided: 100% T Area Debrided (cm): otal 1.12 Tissue and other material debrided: Viable, Non-Viable, Slough, Subcutaneous, Skin: Dermis , Skin: Epidermis, Slough Level: Skin/Subcutaneous Tissue Debridement Description: Excisional Instrument: Curette Bleeding: Minimum Hemostasis Achieved: Pressure Response to Treatment: Procedure was tolerated well Level of Consciousness (Post- Awake and Alert procedure): Post Debridement Measurements of Total Wound Length: (cm) 1.3 Width: (cm) 1.1 Depth: (cm) 0.4 Volume: (cm) 0.449 Character of Wound/Ulcer Post Debridement:  Stable Severity of Tissue Post Debridement: Fat layer exposed Post Procedure Diagnosis Same as Pre-procedure Electronic Signature(s) Signed: 08/01/2023 5:18:23 PM By: Betha Loa Signed: 08/01/2023 5:33:14 PM By: Allen Derry PA-C Signed: 08/01/2023 5:33:20 PM By: Midge Aver MSN RN CNS [address] [place] D ([id number]) [id number] Page 3 of 11 Entered By: Betha Loa on 08/01/2023 14:45:18 -------------------------------------------------------------------------------- Debridement Details Patient Name: Date of Service: [name], [name] 08/01/2023 1:15 PM Medical Record Number: [id number] Patient Account Number: [id number] Date of Birth/Sex: Treating RN: [birth year] (82 y.o. [name] Primary Care Provider: Debbra Riding Other Clinician: Betha Loa Referring Provider: Treating Provider/Extender:  [name], [name], [name] in Treatment: 6 Debridement Performed for Assessment: Wound #3 Right,Lateral Foot Performed By: Physician Allen Derry, PA-C Debridement Type: Debridement Severity of Tissue Pre Debridement: Fat layer exposed Level of Consciousness (Pre-procedure): Awake and Alert Pre-procedure Verification/Time Out Yes - 14:41 Taken: Start Time: 14:41 Percent of Wound Bed Debrided: 100% T Area Debrided (cm): otal 1.79 Tissue and other material debrided: Viable, Non-Viable, Eschar, Slough, Subcutaneous, Skin: Dermis , Skin: Epidermis, Slough Level: Skin/Subcutaneous Tissue Debridement Description: Excisional Instrument: Curette Bleeding: Minimum Hemostasis Achieved: Pressure Response to Treatment: Procedure was tolerated well Level of Consciousness (Post- Awake and Alert procedure): Post Debridement Measurements of Total Wound Length: (cm) 1.9 Width: (cm) 1.2 Depth: (cm) 0.1 Volume: (cm) 0.179 Character of Wound/Ulcer Post Debridement: Stable Severity of Tissue Post Debridement: Fat layer exposed Post Procedure  Diagnosis Same as Pre-procedure Electronic Signature(s) Signed: 08/01/2023 5:18:23 PM By: Betha Loa Signed: 08/01/2023 5:33:14 PM By: Allen Derry PA-C Signed: 08/01/2023 5:33:20 PM By: Midge Aver MSN RN CNS WTA Entered By: Betha Loa on 08/01/2023 14:47:46 HPI Details -------------------------------------------------------------------------------- Cammy Brochure ([id number]) [id number] Page 4 of 11 Patient Name: Date of Service: [name], [name] 08/01/2023 1:15 PM Medical Record Number: [id number] Patient Account Number: [id number] Date of Birth/Sex: Treating RN: [birth year] (82 y.o. [name] Primary Care Provider: Debbra Riding Other Clinician: Referring Provider: Treating Provider/Extender: Jasmine Pang in Treatment: 6 History of Present Illness Chronic/Inactive Conditions Condition 1: Notes from Vascular 05/21/23: Right below-the-knee popliteal artery to peroneal artery bypass with reverse saphenous vein graft. This was done on 04/11/2023. Noninvasive studies in our an ABI 0.61 on the right and 0.67 on the left. T oday arterial duplexes were done which shows biphasic/triphasic waveforms throughout the lower extremities down to the level of the tibial vessels. 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 This is suggestive that the patient's bypass is patent although it is not there is no noted occlusion on duplex 1. Atherosclerosis of native arteries of the extremities with ulceration (HCC) Based on noninvasive studies today appears that the patient would have possible marginal ability for wound healing. Concerned that some of the patient is usually a small vessel disease which may proved to have a little bit more difficult. Will have the patient return in several [name] to see if there has been  improvement. They are advised that if the wound deteriorates or begins to show signs of starting to contact us for sooner follow-up. 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 With that being said that area is somewhat deep to be perfectly honest. It does have a little bit concerned. The remaining areas all came from the boot he was wearing and areas that rubbed they have gotten rid of the boot this will not happen again as far as that is concerned. With that being said the patient has had vascular intervention and the note is above as documented in the vascular note. With that being said it does appear that the patient has what is termed "possible marginal ability for wound healing". I think this is basically something this can be quite difficult for [place] to completely close. I am not seeing them possible but again there is a long road ahead.  Patient does have a history of diabetes mellitus type 2, hypertension, peripheral vascular disease, coronary artery disease, long-term use of anticoagulants, chronic kidney disease stage III, and as of right now there is no direct evidence of continued and ongoing osteomyelitis although I do question whether or not there may be an issue here. 06-27-2023 upon evaluation today patient appears to be doing okay in regard to his wounds. He is showing signs of some improvement which is good news although this appears to be a little bit too moist in regard to the wounds on his feet in general. I do believe that he would benefit from a switching dressings to silver alginate away from the Xeroform at this point unfortunately. With that being said the original wound for which she was referred actually does appear to be doing okay at this point in my opinion. I am actually somewhat pleased with  where we stand in that regard. I do not see any evidence of active infection at this time which is good. I think the Dakin's moistened gauze is helping. 07-04-2023 upon evaluation today patient appears to be doing well currently in regard to his wounds all things considered. I do believe that he is making headway towards some closure and this is good news. Fortunately I do not see any evidence of active infection looking or systemically which is great news as well. No fevers, chills, nausea, vomiting, or diarrhea. 07-11-2023 upon evaluation today patient appears to be doing well currently in regard to his wound. He has primary wound at the amputation site is better and the other wounds are all showing signs of improvement as well the worst being the right lateral foot but even that seems better with the Betadine that we switch to last week. 07-18-2023 upon evaluation today patient appears to be doing well currently in regard to his wound. He has been tolerating the dressing changes without complication. Fortunately I do not see any signs of active infection at this time. 07-25-2023 upon evaluation today patient appears to be doing well currently in regard to his wound. He has been taught routine the dressing changes and all 3 wounds look better especially the amputation site. Overall I am extremely pleased with where we stand currently. 08-01-2023 upon evaluation today patient appears to be doing well currently in regard to his wound. Has been tolerating the dressing changes without complication Iodoflex seems to be cleaning up the side wounds although this is very slow the medial looks better than lateral. With the big guard to the primary surgical site this looks much better. Electronic Signature(s) Signed: 08/01/2023 2:50:36 PM By: Allen Derry PA-C Entered By: Allen Derry on 08/01/2023 14:50:35 -------------------------------------------------------------------------------- Physical Exam  Details Patient Name: Date of Service: [name], [name] 08/01/2023 1:15 PM Medical Record Number: [id number] Patient Account Number: [id number] Date of Birth/Sex: Treating RN: 05-12-1941 (82 y.o. [name] Primary Care Provider: Debbra Riding Other Clinician: Betha Loa Referring Provider: Treating Provider/Extender: Jasmine Pang in Treatment: 6 [name], Charles D ([id number]) [id number] Page 5 of 11 Constitutional Well-nourished and well-hydrated in no acute distress. Respiratory normal breathing without difficulty. Psychiatric this patient is able to make decisions and demonstrates good insight into disease process. Alert and Oriented x 3. pleasant and cooperative. Notes Upon inspection patient's wound bed actually showed signs of good granulation epithelization at this point and some areas the lateral wound is a little bit more eschar covered although there is more good granulation on the  medial wound and the surgical site looks much better I do not perform debridement at each location postdebridement the wound beds appear to be doing much better. Electronic Signature(s) Signed: 08/01/2023 2:51:00 PM By: Allen Derry PA-C Entered By: Allen Derry on 08/01/2023 14:51:00 -------------------------------------------------------------------------------- Physician Orders Details Patient Name: Date of Service: [name], [place] RD D. 08/01/2023 1:15 PM Medical Record Number: [id number] Patient Account Number: [id number] Date of Birth/Sex: Treating RN: Jan 11, 1941 (82 y.o. [name] Primary Care Provider: Debbra Riding Other Clinician: Betha Loa Referring Provider: Treating Provider/Extender: Jasmine Pang in Treatment: 6 The following information was scribed by: Betha Loa The information was scribed for: Allen Derry Verbal / Phone Orders: No Diagnosis Coding ICD-10 Coding Code  Description T81.31XA Disruption of external operation (surgical) wound, not elsewhere classified, initial encounter E11.621 Type 2 diabetes mellitus with foot ulcer L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 I10 Essential (primary) hypertension 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 Z79.01 Long term (current) use of anticoagulants 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 Bathing/ [employer] wounds with antibacterial soap and water. May shower; gently cleanse wound with antibacterial soap, rinse and pat dry prior to dressing wounds Anesthetic (Use 'Patient Medications' Section for Anesthetic Order Entry) Lidocaine applied to wound bed Wound Treatment Wound #1 - Amputation Site - Toe Wound Laterality: Right Cleanser: [employer]ram Ancillary Kit - 15 Day Supply (Generic) 1 x Per Day/30 Days Discharge Instructions: Use supplies as instructed; Kit contains: (15) Saline Bullets; (15) 3x3 Gauze; 15 pr Gloves Cleanser: Dakin 16 (oz) 0.25 1 x Per Day/30 Days [name], Charles ([id number]) [id number] Page 6 of 11 Discharge Instructions: Use as directed. Prim Dressing: Gauze (Generic) 1 x Per Day/30 Days ary Discharge Instructions: As directed: dry, moistened with saline or moistened with Dakins Solution Secondary Dressing: Conforming Guaze Roll-Large (Generic) 1 x Per Day/30 Days Discharge Instructions: Apply Conforming Stretch Guaze Bandage as directed Secured With: Medipore T - 616M Medipore H Soft Cloth Surgical T ape ape, 2x2 (in/yd) (Generic) 1 x Per Day/30 Days Wound #2 - Foot Wound Laterality: Plantar, Right Cleanser: Soap and Water 3 x Per Week/30 Days Discharge Instructions: Gently cleanse wound with antibacterial soap, rinse and pat dry prior to dressing  wounds Prim Dressing: IODOFLEX 0.9% Cadexomer Iodine Pad (Generic) 3 x Per Week/30 Days ary Discharge Instructions: Apply Iodoflex to wound bed only as directed. Secondary Dressing: ABD Pad 5x9 (in/in) (Generic) 3 x Per Week/30 Days Discharge Instructions: Cover with ABD pad Secured With: Medipore T - 616M Medipore H Soft Cloth Surgical T ape ape, 2x2 (in/yd) (Generic) 3 x Per Week/30 Days Secured With: [employer] Sterile or Non-Sterile 6-ply 4.5x4 (yd/yd) (Generic) 3 x Per Week/30 Days Discharge Instructions: Apply Kerlix as directed Wound #3 - Foot Wound Laterality: Right, Lateral Cleanser: Soap and Water 3 x Per Week/30 Days Discharge Instructions: Gently cleanse wound with antibacterial soap, rinse and pat dry prior to dressing wounds Prim Dressing: IODOFLEX 0.9% Cadexomer Iodine Pad (Generic) 3 x Per Week/30 Days ary Discharge Instructions: Apply Iodoflex to wound bed only as directed. Secondary Dressing: ABD Pad 5x9 (in/in) (Generic) 3 x Per Week/30 Days Discharge Instructions: Cover with ABD pad Secured With: Medipore T - 616M Medipore H Soft Cloth Surgical T ape ape, 2x2 (in/yd) (Generic) 3 x Per Week/30 Days Secured With: [employer] or  Non-Sterile 6-ply 4.5x4 (yd/yd) (Generic) 3 x Per Week/30 Days Discharge Instructions: Apply Kerlix as directed Electronic Signature(s) Signed: 08/01/2023 5:18:23 PM By: Betha Loa Signed: 08/01/2023 5:33:14 PM By: Allen Derry PA-C Entered By: Betha Loa on 08/01/2023 15:06:28 -------------------------------------------------------------------------------- Problem List Details Patient Name: Date of Service: [name], [place] RD D. 08/01/2023 1:15 PM Medical Record Number: [id number] Patient Account Number: [id number] Date of Birth/Sex: Treating RN: 02/19/1941 (82 y.o. [name] Primary Care Provider: Debbra Riding Other Clinician: Referring Provider: Treating Provider/Extender: Jasmine Pang in  Treatment: 6 Active Problems ICD-10 Encounter Code Description Active Date MDM Diagnosis [name], Charles ([id number]) [id number] Page 7 of 11 T81.31XA Disruption of external operation (surgical) wound, not elsewhere classified, 06/20/2023 No Yes initial encounter E11.621 Type 2 diabetes mellitus with foot ulcer 06/20/2023 No Yes L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 06/20/2023 No Yes I10 Essential (primary) hypertension 06/20/2023 No Yes 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 06/20/2023 No Yes I25.10 Atherosclerotic heart disease of native coronary artery without angina pectoris 06/20/2023 No Yes Z79.01 Long term (current) use of anticoagulants 06/20/2023 No Yes 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, stage 3 unspecified 06/20/2023 No Yes Inactive Problems Resolved Problems Electronic Signature(s) Signed: 08/01/2023 1:51:20 PM By: Allen Derry PA-C Entered By: Allen Derry on 08/01/2023 13:51:20 -------------------------------------------------------------------------------- Progress Note Details Patient Name: Date of Service: [name], [name] RD D. 08/01/2023 1:15 PM Medical Record Number: [id number] Patient Account Number: [id number] Date of Birth/Sex: Treating RN: Nov 12, 1940 (82 y.o. [name] Primary Care Provider: Debbra Riding Other Clinician: Betha Loa Referring Provider: Treating Provider/Extender: Jasmine Pang in Treatment: 6 Subjective Chief Complaint Information obtained from Patient Right foot ulcers History of Present Illness (HPI) Chronic/Inactive Condition: Notes from Vascular 05/21/23: Right below-the-knee popliteal artery to peroneal artery bypass with reverse saphenous vein graft. This was done on 04/11/2023. Noninvasive studies in our an ABI 0.61 on the right and 0.67 on the left. T oday arterial duplexes were done which shows biphasic/triphasic  waveforms throughout the lower extremities down to the level of the tibial vessels. 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 This is suggestive that the patient's bypass is patent although it is not there is no noted occlusion on duplex 1. Atherosclerosis of native arteries of the extremities with ulceration (HCC) Based on noninvasive studies today appears that the patient would have possible marginal ability for wound healing. Concerned that some of the patient is usually a small vessel disease which may proved to have a little bit more difficult. Will have the patient return in several [name] to see if there has been improvement. They are advised that if the wound deteriorates or begins to show signs of starting to contact us for sooner follow-up. 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 With that being said that area is somewhat deep to be perfectly honest. It does have a little bit concerned. The remaining areas all came from the boot he was wearing and areas that rubbed they have gotten rid of the boot this will not happen again as far as that is concerned. With that being said the patient has had vascular intervention and  the note is above as documented in the vascular note. With that being said it does appear that the patient has what is termed "possible marginal ability for wound healing". I think this is basically something this can be quite difficult for [place] to completely close. I am not seeing them possible but again there is a long road ahead. Patient does have a history of diabetes mellitus type 2, hypertension, peripheral  vascular disease, coronary artery disease, long-term use of anticoagulants, chronic kidney disease stage III, and as of right now there is no direct evidence of continued and ongoing osteomyelitis although I do question whether or not there may be an issue here. 06-27-2023 upon evaluation today patient appears to be doing okay in regard to his wounds. He is showing signs of some improvement which is good news although this appears to be a little bit too moist in regard to the wounds on his feet in general. I do believe that he would benefit from a switching dressings to silver alginate away from the Xeroform at this point unfortunately. With that being said the original wound for which she was referred actually does appear to be doing okay at this point in my opinion. I am actually somewhat pleased with where we stand in that regard. I do not see any evidence of active infection at this time which is good. I think the Dakin's moistened gauze is helping. 07-04-2023 upon evaluation today patient appears to be doing well currently in regard to his wounds all things considered. I do believe that he is making headway towards some closure and this is good news. Fortunately I do not see any evidence of active infection looking or systemically which is great news as well. No fevers, chills, nausea, vomiting, or diarrhea. 07-11-2023 upon evaluation today patient appears to be doing well currently in regard to his wound. He has primary wound at the amputation site is better and the other wounds are all showing signs of improvement as well the worst being the right lateral foot but even that seems better with the Betadine that we switch to last week. 07-18-2023 upon evaluation today patient appears to be doing well currently in regard to his wound. He has been tolerating the dressing changes without complication. Fortunately I do not see any signs of active infection at this time. 07-25-2023 upon evaluation  today patient appears to be doing well currently in regard to his wound. He has been taught routine the dressing changes and all 3 wounds look better especially the amputation site. Overall I am extremely pleased with where we stand currently. 08-01-2023 upon evaluation today patient appears to be doing well currently in regard to his wound. Has been tolerating the dressing changes without complication Iodoflex seems to be cleaning up the side wounds although this is very slow the medial looks better than lateral. With the big guard to the primary surgical site this looks much better. Objective Constitutional Well-nourished and well-hydrated in no acute distress. Vitals Time Taken: 2:17 PM, Height: 68 in, Weight: 167 lbs, BMI: 25.4, Temperature: 98.2 F, Pulse: 77 bpm, Respiratory Rate: 18 breaths/min, Blood Pressure: 174/83 mmHg. Respiratory normal breathing without difficulty. Psychiatric this patient is able to make decisions and demonstrates good insight into disease process. Alert and Oriented x 3. pleasant and cooperative. General Notes: Upon inspection patient's wound bed actually showed signs of good granulation epithelization at this point and some areas the lateral wound is a little bit more eschar covered although there is  more good granulation on the medial wound and the surgical site looks much better I do not perform debridement at each location postdebridement the wound beds appear to be doing much better. Integumentary (Hair, Skin) Wound #1 status is Open. Original cause of wound was Surgical Injury. The date acquired was: 04/16/2023. The wound has been in treatment 6 [name] 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 oe. There is Fat Layer (Subcutaneous Tissue) exposed. There is a medium amount of serosanguineous drainage noted. There is medium (34-66%) pink  granulation within the wound bed. Wound #2 status is Open. Original cause of wound was Footwear Injury. The date acquired was: 06/16/2023. The wound has been in treatment 6 [name] The wound is located on the Right,Plantar Foot. The wound measures 1cm length x 1.4cm width x 0.1cm depth; 1.1cm^2 area and 0.11cm^3 volume. There is a medium amount of serosanguineous drainage noted. There is medium (34-66%) red, pink granulation within the wound bed. There is no necrotic tissue within the wound bed. Wound #3 status is Open. Original cause of wound was Footwear Injury. The date acquired was: 06/16/2023. The wound has been in treatment 6 [name] The wound is located on the Right,Lateral Foot. The wound measures 1.9cm length x 1.2cm width x 0.1cm depth; 1.791cm^2 area and 0.179cm^3 volume. There is a medium amount of serosanguineous drainage noted. There is medium (34-66%) pink, pale granulation within the wound bed. There is no necrotic tissue within the wound bed. Assessment [name], Charles ([id number]) [id number] Page 9 of 11 Active Problems ICD-10 Disruption of external operation (surgical) wound, not elsewhere classified, initial encounter Type 2 diabetes mellitus with foot ulcer Non-pressure chronic ulcer of other part of right foot with fat layer exposed Essential (primary) hypertension Other specified peripheral vascular diseases Atherosclerotic heart disease of native coronary artery without angina pectoris Long term (current) use of anticoagulants Chronic kidney disease, stage 3 unspecified Procedures Wound #1 Pre-procedure diagnosis of Wound #1 is a Dehisced Wound located on the Right Amputation Site - T . There was a Excisional Skin/Subcutaneous Tissue oe Debridement with a total area of 0.41 sq cm performed by Allen Derry, PA-C. With the following instrument(s): Curette to remove Viable and Non-Viable tissue/material. Material removed includes Subcutaneous  Tissue and Slough and. A time out was conducted at 14:41, prior to the start of the procedure. A Minimum amount of bleeding was controlled with Pressure. The procedure was tolerated well. Post Debridement Measurements: 1.3cm length x 0.4cm width x 1cm depth; 0.408cm^3 volume. Character of Wound/Ulcer Post Debridement is stable. Post procedure Diagnosis Wound #1: Same as Pre-Procedure Wound #2 Pre-procedure diagnosis of Wound #2 is a Diabetic Wound/Ulcer of the Lower Extremity located on the Right,Plantar Foot .Severity of Tissue Pre Debridement is: Fat layer exposed. There was a Excisional Skin/Subcutaneous Tissue Debridement with a total area of 1.12 sq cm performed by Allen Derry, PA-C. With the following instrument(s): Curette to remove Viable and Non-Viable tissue/material. Material removed includes Subcutaneous Tissue, Slough, Skin: Dermis, and Skin: Epidermis. A time out was conducted at 14:41, prior to the start of the procedure. A Minimum amount of bleeding was controlled with Pressure. The procedure was tolerated well. Post Debridement Measurements: 1.3cm length x 1.1cm width x 0.4cm depth; 0.449cm^3 volume. Character of Wound/Ulcer Post Debridement is stable. Severity of Tissue Post Debridement is: Fat layer exposed. Post procedure Diagnosis Wound #2: Same as Pre-Procedure Wound #  3 Pre-procedure diagnosis of Wound #3 is a Diabetic Wound/Ulcer of the Lower Extremity located on the Right,Lateral Foot .Severity of Tissue Pre Debridement is: Fat layer exposed. There was a Excisional Skin/Subcutaneous Tissue Debridement with a total area of 1.79 sq cm performed by Allen Derry, PA-C. With the following instrument(s): Curette to remove Viable and Non-Viable tissue/material. Material removed includes Eschar, Subcutaneous Tissue, Slough, Skin: Dermis, and Skin: Epidermis. A time out was conducted at 14:41, prior to the start of the procedure. A Minimum amount of bleeding was controlled  with Pressure. The procedure was tolerated well. Post Debridement Measurements: 1.9cm length x 1.2cm width x 0.1cm depth; 0.179cm^3 volume. Character of Wound/Ulcer Post Debridement is stable. Severity of Tissue Post Debridement is: Fat layer exposed. Post procedure Diagnosis Wound #3: Same as Pre-Procedure Plan Follow-up Appointments: Return Appointment in 1 week. Bathing/ Shower/ Hygiene: Wash wounds with antibacterial soap and water. May shower; gently cleanse wound with antibacterial soap, rinse and pat dry prior to dressing wounds Anesthetic (Use 'Patient Medications' Section for Anesthetic Order Entry): Lidocaine applied to wound bed WOUND #1: - Amputation Site - T oe Wound Laterality: Right Cleanser: Byram Ancillary Kit - 15 Day Supply (Generic) 1 x Per Day/30 Days Discharge Instructions: Use supplies as instructed; Kit contains: (15) Saline Bullets; (15) 3x3 Gauze; 15 pr Gloves Cleanser: Dakin 16 (oz) 0.25 1 x Per Day/30 Days Discharge Instructions: Use as directed. Prim Dressing: Gauze (Generic) 1 x Per Day/30 Days ary Discharge Instructions: As directed: dry, moistened with saline or moistened with Dakins Solution Secondary Dressing: Conforming Guaze Roll-Large (Generic) 1 x Per Day/30 Days Discharge Instructions: Apply Conforming Stretch Guaze Bandage as directed Secured With: Medipore T - 16M Medipore H Soft Cloth Surgical T ape ape, 2x2 (in/yd) (Generic) 1 x Per Day/30 Days WOUND #2: - Foot Wound Laterality: Plantar, Right Cleanser: Soap and Water 3 x Per Week/30 Days Discharge Instructions: Gently cleanse wound with antibacterial soap, rinse and pat dry prior to dressing wounds Prim Dressing: IODOFLEX 0.9% Cadexomer Iodine Pad (Generic) 3 x Per Week/30 Days ary Discharge Instructions: Apply Iodoflex to wound bed only as directed. Secondary Dressing: ABD Pad 5x9 (in/in) (Generic) 3 x Per Week/30 Days Discharge Instructions: Cover with ABD pad Secured With: Medipore T - 16M  Medipore H Soft Cloth Surgical T ape ape, 2x2 (in/yd) (Generic) 3 x Per Week/30 Days Secured With: [employer] Sterile or Non-Sterile 6-ply 4.5x4 (yd/yd) (Generic) 3 x Per Week/30 Days Discharge Instructions: Apply Kerlix as directed WOUND #3: - Foot Wound Laterality: Right, Lateral Cleanser: Soap and Water 3 x Per Week/30 Days Discharge Instructions: Gently cleanse wound with antibacterial soap, rinse and pat dry prior to dressing wounds Prim Dressing: IODOFLEX 0.9% Cadexomer Iodine Pad (Generic) 3 x Per Week/30 Days ary Discharge Instructions: Apply Iodoflex to wound bed only as directed. Secondary Dressing: ABD Pad 5x9 (in/in) (Generic) 3 x Per Week/30 Days Discharge Instructions: Cover with ABD pad Secured With: Medipore T - 16M Medipore H Soft Cloth Surgical T ape ape, 2x2 (in/yd) (Generic) 3 x Per Week/30 Days WALLICE, FRYMIER ([id number]) [id number] Page 10 of 11 Secured With: [employer] or Non-Sterile 6-ply 4.5x4 (yd/yd) (Generic) 3 x Per Week/30 Days Discharge Instructions: Apply Kerlix as directed 1. Would recommend that we have the patient going continue to monitor for any signs of infection or worsening. Based on what I am seeing I do believe that the patient is tolerating the dressing changes without complication. I think that he is doing significantly  better compared to where he was previous. 2. I am going to recommend that we should continue with the Dakin's moistened gauze dressing for the amputation site and for the remaining wounds on each lateral medial part of his foot I would recommend continue with the Iodoflex. We will see patient back for reevaluation in 1 week here in the clinic. If anything worsens or changes patient will contact our office for additional recommendations. Electronic Signature(s) Signed: 08/01/2023 2:51:26 PM By: Allen Derry PA-C Entered By: Allen Derry on 08/01/2023  14:51:25 -------------------------------------------------------------------------------- SuperBill Details Patient Name: Date of Service: [name], HO [place] RD D. 08/01/2023 Medical Record Number: [id number] Patient Account Number: [id number] Date of Birth/Sex: Treating RN: 1941-09-10 (82 y.o. [name] Primary Care Provider: Debbra Riding Other Clinician: Betha Loa Referring Provider: Treating Provider/Extender: Jasmine Pang in Treatment: 6 Diagnosis Coding ICD-10 Codes Code Description T81.31XA Disruption of external operation (surgical) wound, not elsewhere classified, initial encounter E11.621 Type 2 diabetes mellitus with foot ulcer L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 I10 Essential (primary) hypertension 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 Z79.01 Long term (current) use of anticoagulants 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, stage 3 unspecified Facility Procedures : CPT4 Code: [id number] Description: 11042 - DEB SUBQ TISSUE 20 SQ CM/< ICD-10 Diagnosis Description L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 Modifier: Quantity: 1 Physician Procedures : CPT4 Code Description Modifier [id number] 11042 - WC PHYS SUBQ TISS 20 SQ CM ICD-10 Diagnosis Description L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 Quantity: 1 Electronic Signature(s) Signed: 08/01/2023 2:51:41 PM By: Allen Derry PA-C Entered By: Allen Derry on 08/01/2023 14:51:41 Peter Congo D ([id number]) [id number] Page 11 of 11

## 2023-08-01 NOTE — Progress Notes (Addendum)
SHERIFF, AMESQUITA ([id number]) [id number] Page 1 of 10 Visit Report for 08/01/2023 Arrival Information Details Patient Name: Date of Service: [name], [name] 08/01/2023 1:15 PM Medical Record Number: [id number] Patient Account Number: [id number] Date of Birth/Sex: Treating RN: [birth year] (82 y.o. [name] Primary Care Garrett Mitchum: Debbra Riding Other Clinician: Betha Loa Referring Wyat Infinger: Treating Daneesha Quinteros/Extender: Jasmine Pang in Treatment: 6 Visit Information History Since Last Visit All ordered tests and consults were completed: No Patient Arrived: Wheel Chair Added or deleted any medications: No Arrival Time: 14:13 Any new allergies or adverse reactions: No Transfer Assistance: EasyPivot Patient Lift Had a fall or experienced change in No Patient Identification Verified: Yes activities of daily living that may affect Secondary Verification Process Completed: Yes risk of falls: Patient Requires Transmission-Based Precautions: No Signs or symptoms of abuse/neglect since last visito No Patient Has Alerts: Yes Hospitalized since last visit: No Patient Alerts: Patient on Blood Thinner Implantable device outside of the clinic excluding No diabetes type 2 cellular tissue based products placed in the center Plavix since last visit: Has Dressing in Place as Prescribed: Yes Pain Present Now: No Electronic Signature(s) Signed: 08/01/2023 5:18:23 PM By: Betha Loa Entered By: Betha Loa on 08/01/2023 14:37:14 -------------------------------------------------------------------------------- Clinic Level of Care Assessment Details Patient Name: Date of Service: [name], [name] 08/01/2023 1:15 PM Medical Record Number: [id number] Patient Account Number: [id number] Date of Birth/Sex: Treating RN: 12-26-1940 (82 y.o. [name] Primary Care Estefanie Cornforth: Debbra Riding Other Clinician: Betha Loa Referring Tanyia Grabbe: Treating Nael Petrosyan/Extender: Jasmine Pang in Treatment: 6 Clinic Level of Care Assessment Items TOOL 1 Quantity Score []  - 0 Use when EandM and Procedure is performed on INITIAL visit ASSESSMENTS - Nursing Assessment / Reassessment []  - 0 General Physical Exam (combine w/ comprehensive assessment (listed just below) when performed on new pt. evals) []  - 0 Comprehensive Assessment (HX, ROS, Risk Assessments, Wounds Hx, etc.) [name], [name] ([id number]) [id number] Page 2 of 10 ASSESSMENTS - Wound and Skin Assessment / Reassessment []  - 0 Dermatologic / Skin Assessment (not related to wound area) ASSESSMENTS - Ostomy and/or Continence Assessment and Care []  - 0 Incontinence Assessment and Management []  - 0 Ostomy Care Assessment and Management (repouching, etc.) PROCESS - Coordination of Care []  - 0 Simple Patient / Family Education for ongoing care []  - 0 Complex (extensive) Patient / Family Education for ongoing care []  - 0 Staff obtains Chiropractor, Records, T Results / Process Orders est []  - 0 Staff telephones HHA, Nursing Homes / Clarify orders / etc []  - 0 Routine Transfer to another Facility (non-emergent condition) []  - 0 Routine Hospital Admission (non-emergent condition) []  - 0 New Admissions / Manufacturing engineer / Ordering NPWT Apligraf, etc. , []  - 0 Emergency Hospital Admission (emergent condition) PROCESS - Special Needs []  - 0 Pediatric / Minor Patient Management []  - 0 Isolation Patient Management []  - 0 Hearing / Language / Visual special needs []  - 0 Assessment of Community assistance (transportation, D/C planning, etc.) []  - 0 Additional assistance / Altered mentation []  - 0 Support Surface(s) Assessment (bed, cushion, seat, etc.) INTERVENTIONS - Miscellaneous []  - 0 External ear exam []  - 0 Patient Transfer (multiple staff / Nurse, adult / Similar devices) []  -  0 Simple Staple / Suture removal (25 or less) []  - 0 Complex Staple / Suture removal (26 or more) []  - 0 Hypo/Hyperglycemic Management (do not check if billed separately) []  -  0 Ankle / Brachial Index (ABI) - do not check if billed separately Has the patient been seen at the hospital within the last three years: Yes Total Score: 0 Level Of Care: ____ Electronic Signature(s) Signed: 08/01/2023 5:18:23 PM By: Betha Loa Entered By: Betha Loa on 08/01/2023 14:48:08 -------------------------------------------------------------------------------- Encounter Discharge Information Details Patient Name: Date of Service: [name], [place] RD D. 08/01/2023 1:15 PM Medical Record Number: [id number] Patient Account Number: [id number] Date of Birth/Sex: Treating RN: 1940-10-14 (82 y.o. [name] Primary Care Aine Strycharz: Debbra Riding Other Clinician: Betha Loa Referring Dandra Velardi: Treating Brenda Samano/Extender: Worthy Flank Dailey, Dimas [place] D ([id number]) [phone].pdf Page 3 of 10 Weeks in Treatment: 6 Encounter Discharge Information Items Post Procedure Vitals Discharge Condition: Stable Temperature (F): 98.2 Ambulatory Status: Wheelchair Pulse (bpm): 77 Discharge Destination: Home Respiratory Rate (breaths/min): 18 Transportation: Private Auto Blood Pressure (mmHg): 174/83 Accompanied By: daughter in law Schedule Follow-up Appointment: Yes Clinical Summary of Care: Electronic Signature(s) Signed: 08/01/2023 5:18:23 PM By: Betha Loa Entered By: Betha Loa on 08/01/2023 15:07:24 -------------------------------------------------------------------------------- Lower Extremity Assessment Details Patient Name: Date of Service: [name], [place] RD D. 08/01/2023 1:15 PM Medical Record Number: [id number] Patient Account Number: [id number] Date of Birth/Sex: Treating RN: 03/11/1941 (82 y.o. [name] Primary Care Caeden Foots:  Debbra Riding Other Clinician: Betha Loa Referring Atanacio Melnyk: Treating Naquita Nappier/Extender: Jasmine Pang in Treatment: 6 Electronic Signature(s) Signed: 08/01/2023 5:18:23 PM By: Betha Loa Signed: 08/01/2023 5:33:20 PM By: Midge Aver MSN RN CNS WTA Entered By: Betha Loa on 08/01/2023 14:39:21 -------------------------------------------------------------------------------- Multi Wound Chart Details Patient Name: Date of Service: [name], [place] RD D. 08/01/2023 1:15 PM Medical Record Number: [id number] Patient Account Number: [id number] Date of Birth/Sex: Treating RN: [birth year] (82 y.o. [name] Primary Care Adonias Demore: Debbra Riding Other Clinician: Betha Loa Referring Johnross Nabozny: Treating Braeson Rupe/Extender: Jasmine Pang in Treatment: 6 Vital Signs Height(in): 68 Pulse(bpm): 77 Weight(lbs): 167 Blood Pressure(mmHg): 174/83 Body Mass Index(BMI): 25.4 Temperature(F): 98.2 Respiratory Rate(breaths/min): 18 [Mellone, Caylen D ([id number]):Photos:] [[id number] Page 4 of 10:1 2 3] Right Amputation Site - Toe Right, Plantar Foot Right, Lateral Foot Wound Location: Surgical Injury Footwear Injury Footwear Injury Wounding Event: Dehisced Wound Diabetic Wound/Ulcer of the Lower Diabetic Wound/Ulcer of the Lower Primary Etiology: Extremity Extremity Congestive Heart Failure, Congestive Heart Failure, Congestive Heart Failure, Comorbid History: Hypertension, Peripheral Venous Hypertension, Peripheral Venous Hypertension, Peripheral Venous Disease, Type II Diabetes, Disease, Type II Diabetes, Disease, Type II Diabetes, Neuropathy Neuropathy Neuropathy 04/16/2023 06/16/2023 06/16/2023 Date Acquired: 6 6 6  Weeks of Treatment: Open Open Open Wound Status: No No No Wound Recurrence: 1.3x0.4x1 1x1.4x0.1 1.9x1.2x0.1 Measurements L x W x D (cm) 0.408 1.1 1.791 A (cm) : rea 0.408 0.11  0.179 Volume (cm) : 65.40% -185.70% -14.00% % Reduction in Area: 73.40% -189.50% -14.00% % Reduction in Volume: Full Thickness Without Exposed Grade 1 Grade 2 Classification: Support Structures Medium Medium Medium Exudate Amount: Serosanguineous Serosanguineous Serosanguineous Exudate Type: red, brown red, brown red, brown Exudate Color: Medium (34-66%) Medium (34-66%) Medium (34-66%) Granulation Amount: Pink Red, Pink Pink, Pale Granulation Quality: N/A None Present (0%) None Present (0%) Necrotic Amount: Fat Layer (Subcutaneous Tissue): Yes Fascia: No Fascia: No Exposed Structures: Fascia: No Fat Layer (Subcutaneous Tissue): No Fat Layer (Subcutaneous Tissue): No Tendon: No Tendon: No Tendon: No Muscle: No Muscle: No Muscle: No Joint: No Joint: No Joint: No Bone: No Bone: No Bone: No None Small (1-33%) None Epithelialization: Treatment Notes Electronic Signature(s) Signed: 08/01/2023 5:18:23  PM By: Betha Loa Entered By: Betha Loa on 08/01/2023 14:39:35 -------------------------------------------------------------------------------- Multi-Disciplinary Care Plan Details Patient Name: Date of Service: [place], [name] 08/01/2023 1:15 PM Medical Record Number: [id number] Patient Account Number: [id number] Date of Birth/Sex: Treating RN: 1941-09-05 (82 y.o. [name] Primary Care Alnita Aybar: Debbra Riding Other Clinician: Betha Loa Referring Kaisei Gilbo: Treating Chazlyn Cude/Extender: Jasmine Pang in Treatment: 6 Active Inactive Wound/Skin Impairment MARTA, LAYDEN ([id number]) [id number] Page 5 of 10 Nursing Diagnoses: Impaired tissue integrity Knowledge deficit related to ulceration/compromised skin integrity Goals: Patient/caregiver will verbalize understanding of skin care regimen Date Initiated: 06/20/2023 Date Inactivated: 07/18/2023 Target Resolution Date: 07/21/2023 Goal Status:  Met Ulcer/skin breakdown will have a volume reduction of 30% by week 4 Date Initiated: 06/20/2023 Date Inactivated: 07/18/2023 Target Resolution Date: 07/21/2023 Goal Status: Met Ulcer/skin breakdown will have a volume reduction of 50% by week 8 Date Initiated: 06/20/2023 Target Resolution Date: 08/20/2023 Goal Status: Active Ulcer/skin breakdown will have a volume reduction of 80% by week 12 Date Initiated: 06/20/2023 Target Resolution Date: 09/20/2023 Goal Status: Active Ulcer/skin breakdown will heal within 14 weeks Date Initiated: 06/20/2023 Target Resolution Date: 10/04/2023 Goal Status: Active Interventions: Assess patient/caregiver ability to obtain necessary supplies Assess patient/caregiver ability to perform ulcer/skin care regimen upon admission and as needed Assess ulceration(s) every visit Provide education on ulcer and skin care Treatment Activities: Referred to DME Jaamal Farooqui for dressing supplies : 06/20/2023 Skin care regimen initiated : 06/20/2023 Topical wound management initiated : 06/20/2023 Notes: Electronic Signature(s) Signed: 08/01/2023 5:18:23 PM By: Betha Loa Signed: 08/01/2023 5:33:20 PM By: Midge Aver MSN RN CNS WTA Entered By: Betha Loa on 08/01/2023 14:48:18 -------------------------------------------------------------------------------- Pain Assessment Details Patient Name: Date of Service: [name], [place] RD D. 08/01/2023 1:15 PM Medical Record Number: [id number] Patient Account Number: [id number] Date of Birth/Sex: Treating RN: 05/24/1941 (82 y.o. [name] Primary Care Jannelly Bergren: Debbra Riding Other Clinician: Betha Loa Referring Trevaughn Schear: Treating Annalie Wenner/Extender: Jasmine Pang in Treatment: 6 Active Problems Location of Pain Severity and Description of Pain Patient Has Paino No Site Locations [place], [place] D ([id number]) [id number].pdf Page 6 of 10 Pain Management and  Medication Current Pain Management: Electronic Signature(s) Signed: 08/01/2023 5:18:23 PM By: Betha Loa Signed: 08/01/2023 5:33:20 PM By: Midge Aver MSN RN CNS WTA Entered By: Betha Loa on 08/01/2023 14:37:52 -------------------------------------------------------------------------------- Patient/Caregiver Education Details Patient Name: Date of Service: [name], [name] RD D. 11/14/2024andnbsp1:15 PM Medical Record Number: [id number] Patient Account Number: [id number] Date of Birth/Gender: Treating RN: 02-Sep-1941 (82 y.o. [name] Primary Care Physician: Debbra Riding Other Clinician: Betha Loa Referring Physician: Treating Physician/Extender: Jasmine Pang in Treatment: 6 Education Assessment Education Provided To: Patient Education Topics Provided Wound/Skin Impairment: Handouts: Other: continue wound care as directed Methods: Explain/Verbal Responses: State content correctly Electronic Signature(s) Signed: 08/01/2023 5:18:23 PM By: Betha Loa Entered By: Betha Loa on 08/01/2023 14:48:38 Peter [place] D ([id number]) [id number] Page 7 of 10 -------------------------------------------------------------------------------- Wound Assessment Details Patient Name: Date of Service: [name], [name] 08/01/2023 1:15 PM Medical Record Number: [id number] Patient Account Number: [id number] Date of Birth/Sex: Treating RN: 1940-10-21 (82 y.o. [name] Primary Care Faythe Heitzenrater: Debbra Riding Other Clinician: Referring Tyauna Lacaze: Treating Asusena Sigley/Extender: Jasmine Pang in Treatment: 6 Wound Status Wound Number: 1 Primary Dehisced Wound Etiology: Wound Location: Right Amputation Site - Toe Wound Open Wounding Event: Surgical Injury Status: Date Acquired: 04/16/2023 Comorbid Congestive Heart Failure, Hypertension, Peripheral Venous  Weeks Of Treatment: 6 History: Disease,  Type II Diabetes, Neuropathy Clustered Wound: No Photos Wound Measurements Length: (cm) 1.3 Width: (cm) 0.4 Depth: (cm) 1 Area: (cm) 0.408 Volume: (cm) 0.408 % Reduction in Area: 65.4% % Reduction in Volume: 73.4% Epithelialization: None Wound Description Classification: Full Thickness Without Exposed Support Structures Exudate Amount: Medium Exudate Type: Serosanguineous Exudate Color: red, brown Foul Odor After Cleansing: No Slough/Fibrino No Wound Bed Granulation Amount: Medium (34-66%) Exposed Structure Granulation Quality: Pink Fascia Exposed: No Fat Layer (Subcutaneous Tissue) Exposed: Yes Tendon Exposed: No Muscle Exposed: No Joint Exposed: No Bone Exposed: No Treatment Notes Wound #1 (Amputation Site - Toe) Wound Laterality: Right Cleanser Byram Ancillary Kit - 15 Day Supply Discharge Instruction: Use supplies as instructed; Kit contains: (15) Saline Bullets; (15) 3x3 Gauze; 15 pr Gloves Dakin 16 (oz) 0.25 CAMRYN, EISENREICH D ([id number]) [id number] Page 8 of 10 Discharge Instruction: Use as directed. Peri-Wound Care Topical Primary Dressing Gauze Discharge Instruction: As directed: dry, moistened with saline or moistened with Dakins Solution Secondary Dressing Conforming Guaze Roll-Large Discharge Instruction: Apply Conforming Stretch Guaze Bandage as directed Secured With Medipore T - 67M Medipore H Soft Cloth Surgical T ape ape, 2x2 (in/yd) Compression Wrap Compression Stockings Add-Ons Electronic Signature(s) Signed: 08/01/2023 5:18:23 PM By: Betha Loa Signed: 08/01/2023 5:33:20 PM By: Midge Aver MSN RN CNS WTA Entered By: Betha Loa on 08/01/2023 14:29:53 -------------------------------------------------------------------------------- Wound Assessment Details Patient Name: Date of Service: [name], [place] RD D. 08/01/2023 1:15 PM Medical Record Number: [id number] Patient Account Number: [id number] Date of  Birth/Sex: Treating RN: [birth year] (82 y.o. [name] Primary Care Piercen Covino: Debbra Riding Other Clinician: Referring Ariannie Penaloza: Treating Raghad Lorenz/Extender: Jasmine Pang in Treatment: 6 Wound Status Wound Number: 2 Primary Diabetic Wound/Ulcer of the Lower Extremity Etiology: Wound Location: Right, Plantar Foot Wound Open Wounding Event: Footwear Injury Status: Date Acquired: 06/16/2023 Comorbid Congestive Heart Failure, Hypertension, Peripheral Venous Weeks Of Treatment: 6 History: Disease, Type II Diabetes, Neuropathy Clustered Wound: No Photos Wound Measurements Length: (cm) 1 Width: (cm) 1.4 Depth: (cm) 0.1 Force, Matei D ([id number]) Area: (cm) 1.1 Volume: (cm) 0.11 % Reduction in Area: -185.7% % Reduction in Volume: -189.5% Epithelialization: Small (1-33%) [id number] Page 9 of 10 Wound Description Classification: Grade 1 Exudate Amount: Medium Exudate Type: Serosanguineous Exudate Color: red, brown Foul Odor After Cleansing: No Slough/Fibrino No Wound Bed Granulation Amount: Medium (34-66%) Exposed Structure Granulation Quality: Red, Pink Fascia Exposed: No Necrotic Amount: None Present (0%) Fat Layer (Subcutaneous Tissue) Exposed: No Tendon Exposed: No Muscle Exposed: No Joint Exposed: No Bone Exposed: No Electronic Signature(s) Signed: 08/01/2023 5:18:23 PM By: Betha Loa Signed: 08/01/2023 5:33:20 PM By: Midge Aver MSN RN CNS WTA Entered By: Betha Loa on 08/01/2023 14:30:34 -------------------------------------------------------------------------------- Wound Assessment Details Patient Name: Date of Service: [name], [place] RD D. 08/01/2023 1:15 PM Medical Record Number: [id number] Patient Account Number: [id number] Date of Birth/Sex: Treating RN: Feb 08, 1941 (82 y.o. [name] Primary Care Barack Nicodemus: Debbra Riding Other Clinician: Referring Alphonso Gregson: Treating Teela Narducci/Extender:  Jasmine Pang in Treatment: 6 Wound Status Wound Number: 3 Primary Diabetic Wound/Ulcer of the Lower Extremity Etiology: Wound Location: Right, Lateral Foot Wound Open Wounding Event: Footwear Injury Status: Date Acquired: 06/16/2023 Comorbid Congestive Heart Failure, Hypertension, Peripheral Venous Weeks Of Treatment: 6 History: Disease, Type II Diabetes, Neuropathy Clustered Wound: No Photos Wound Measurements Length: (cm) 1.9 Width: (cm) 1.2 Depth: (cm) 0.1 Area: (cm) 1.791 Volume: (cm) 0.179 Sahr, [place] D ([id number]) % Reduction in Area: -  14% % Reduction in Volume: -14% Epithelialization: None [id number] Page 10 of 10 Wound Description Classification: Grade 2 Exudate Amount: Medium Exudate Type: Serosanguineous Exudate Color: red, brown Foul Odor After Cleansing: No Slough/Fibrino No Wound Bed Granulation Amount: Medium (34-66%) Exposed Structure Granulation Quality: Pink, Pale Fascia Exposed: No Necrotic Amount: None Present (0%) Fat Layer (Subcutaneous Tissue) Exposed: No Tendon Exposed: No Muscle Exposed: No Joint Exposed: No Bone Exposed: No Electronic Signature(s) Signed: 08/01/2023 5:18:23 PM By: Betha Loa Signed: 08/01/2023 5:33:20 PM By: Midge Aver MSN RN CNS WTA Entered By: Betha Loa on 08/01/2023 14:31:00 -------------------------------------------------------------------------------- Vitals Details Patient Name: Date of Service: [name], [place] RD D. 08/01/2023 1:15 PM Medical Record Number: [id number] Patient Account Number: [id number] Date of Birth/Sex: Treating RN: September 10, 1941 (82 y.o. [name] Primary Care Dezra Mandella: Debbra Riding Other Clinician: Betha Loa Referring Kyllian Clingerman: Treating Kortne All/Extender: Jasmine Pang in Treatment: 6 Vital Signs Time Taken: 14:17 Temperature (F): 98.2 Height (in): 68 Pulse (bpm): 77 Weight (lbs):  167 Respiratory Rate (breaths/min): 18 Body Mass Index (BMI): 25.4 Blood Pressure (mmHg): 174/83 Reference Range: 80 - 120 mg / dl Electronic Signature(s) Signed: 08/01/2023 5:18:23 PM By: Betha Loa Entered By: Betha Loa on 08/01/2023 14:37:21

## 2023-08-06 ENCOUNTER — Ambulatory Visit: Payer: Medicare Other

## 2023-08-08 ENCOUNTER — Encounter: Payer: Medicare Other | Admitting: Physician Assistant

## 2023-08-08 ENCOUNTER — Ambulatory Visit: Payer: Medicare Other

## 2023-08-08 DIAGNOSIS — T8131XA Disruption of external operation (surgical) wound, not elsewhere classified, initial encounter: Secondary | ICD-10-CM | POA: Diagnosis not present

## 2023-08-08 NOTE — Progress Notes (Addendum)
ADE, SCHETTLER ([id number]) [id number] Page 1 of 10 Visit Report for 08/08/2023 Chief Complaint Document Details Patient Name: Date of Service: [name], [name] 08/08/2023 1:15 PM Medical Record Number: [id number] Patient Account Number: [id number] Date of Birth/Sex: Treating RN: August 03, 1941 (82 y.o. [name] Primary Care Provider: Debbra Riding Other Clinician: Referring Provider: Treating Provider/Extender: Jasmine Pang in Treatment: 7 Information Obtained from: Patient Chief Complaint Right foot ulcers Electronic Signature(s) Signed: 08/08/2023 1:30:05 PM By: Allen Derry PA-C Entered By: Allen Derry on 08/08/2023 13:30:05 -------------------------------------------------------------------------------- Debridement Details Patient Name: Date of Service: [name], [name] RD D. 08/08/2023 1:15 PM Medical Record Number: [id number] Patient Account Number: [id number] Date of Birth/Sex: Treating RN: [birth year] (82 y.o. [name] Primary Care Provider: Debbra Riding Other Clinician: Referring Provider: Treating Provider/Extender: Jasmine Pang in Treatment: 7 Debridement Performed for Assessment: Wound #2 Right,Plantar Foot Performed By: Physician Allen Derry, PA-C The following information was scribed by: Midge Aver The information was scribed for: Allen Derry Debridement Type: Chemical/Enzymatic/Mechanical Agent Used: Santyl Severity of Tissue Pre Debridement: Fat layer exposed Level of Consciousness (Pre-procedure): Awake and Alert Pre-procedure Verification/Time Out Yes - 14:10 Taken: Start Time: 14:10 Percent of Wound Bed Debrided: Instrument: Other : santyl Bleeding: None Procedural Pain: 0 Post Procedural Pain: 0 Response to Treatment: Procedure was tolerated well Level of Consciousness (Post- Awake and Alert procedure): JUANMIGUEL, ROJANO ([id number])  [id number] Page 2 of 10 Post Debridement Measurements of Total Wound Length: (cm) 1 Width: (cm) 1 Depth: (cm) 0.1 Volume: (cm) 0.079 Character of Wound/Ulcer Post Debridement: Stable Severity of Tissue Post Debridement: Fat layer exposed Post Procedure Diagnosis Same as Pre-procedure Electronic Signature(s) Signed: 08/08/2023 6:01:43 PM By: Allen Derry PA-C Signed: 08/09/2023 12:46:12 PM By: Midge Aver MSN RN CNS WTA Entered By: Midge Aver on 08/08/2023 14:17:46 -------------------------------------------------------------------------------- Debridement Details Patient Name: Date of Service: [name], [place] RD D. 08/08/2023 1:15 PM Medical Record Number: [id number] Patient Account Number: [id number] Date of Birth/Sex: Treating RN: 1941-03-24 (82 y.o. [name] Primary Care Provider: Debbra Riding Other Clinician: Referring Provider: Treating Provider/Extender: Jasmine Pang in Treatment: 7 Debridement Performed for Assessment: Wound #3 Right,Lateral Foot Performed By: Physician Allen Derry, PA-C The following information was scribed by: Midge Aver The information was scribed for: Allen Derry Debridement Type: Chemical/Enzymatic/Mechanical Agent Used: Santyl Severity of Tissue Pre Debridement: Fat layer exposed Level of Consciousness (Pre-procedure): Awake and Alert Pre-procedure Verification/Time Out Yes - 14:10 Taken: Start Time: 14:10 Percent of Wound Bed Debrided: Instrument: Other : santyl Bleeding: None Procedural Pain: 0 Post Procedural Pain: 0 Response to Treatment: Procedure was tolerated well Level of Consciousness (Post- Awake and Alert procedure): Post Debridement Measurements of Total Wound Length: (cm) 1.9 Width: (cm) 1.5 Depth: (cm) 0.3 Volume: (cm) 0.672 Character of Wound/Ulcer Post Debridement: Stable Severity of Tissue Post Debridement: Fat layer exposed Post Procedure Diagnosis Same  as Pre-procedure Electronic Signature(s) Signed: 08/08/2023 6:01:43 PM By: Allen Derry PA-C Signed: 08/09/2023 12:46:12 PM By: Midge Aver MSN RN CNS [address] [place] ([id number]) [id number] Page 3 of 10 Entered By: Midge Aver on 08/08/2023 14:17:57 -------------------------------------------------------------------------------- HPI Details Patient Name: Date of Service: [name], [name] 08/08/2023 1:15 PM Medical Record Number: [id number] Patient Account Number: [id number] Date of Birth/Sex: Treating RN: 01/12/1941 (82 y.o. [name] Primary Care Provider: Debbra Riding Other Clinician: Referring Provider: Treating Provider/Extender: Jasmine Pang in Treatment: 7 History of  Present Illness Chronic/Inactive Conditions Condition 1: Notes from Vascular 05/21/23: Right below-the-knee popliteal artery to peroneal artery bypass with reverse saphenous vein graft. This was done on 04/11/2023. Noninvasive studies in our an ABI 0.61 on the right and 0.67 on the left. T oday arterial duplexes were done which shows biphasic/triphasic waveforms throughout the lower extremities down to the level of the tibial vessels. 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 This is suggestive that the patient's bypass is patent although it is not there is no noted occlusion on duplex 1. Atherosclerosis of native arteries of the extremities with ulceration (HCC) Based on noninvasive studies today appears that the patient would have possible marginal ability for wound healing. Concerned that some of the patient is usually a small vessel disease which may proved to have a little bit more difficult. Will have the patient return in several weeks to see if there has been improvement. They are advised that if the wound deteriorates or begins to  show signs of starting to contact us for sooner follow-up. 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 With that being said that area is somewhat deep to be perfectly honest. It does have a little bit concerned. The remaining areas all came from the boot he was wearing and areas that rubbed they have gotten rid of the boot this will not happen again as far as that is concerned. With that being said the patient has had vascular intervention and the note is above as documented in the vascular note. With that being said it does appear that the patient has what is termed "possible marginal ability for wound healing". I think this is basically something this can be quite difficult for [place] to completely close. I am not seeing them possible but again there is a long road ahead. Patient does have a history of diabetes mellitus type 2, hypertension, peripheral vascular disease, coronary artery disease, long-term use of anticoagulants, chronic kidney disease stage III, and as of right now there is no direct evidence of continued and ongoing osteomyelitis although I do question whether or not there may be an issue here. 06-27-2023 upon evaluation today patient appears to be doing okay in regard to his wounds. He is showing signs of some improvement which is good news although this appears to be a little bit too moist in regard to the wounds on his feet in general. I do believe that he would benefit from a switching dressings to silver alginate away from the Xeroform at this point unfortunately. With that being said the original wound for which she was referred actually does appear to be doing okay at this point in my opinion. I am actually somewhat pleased with where we stand in that regard. I do not see any evidence of active  infection at this time which is good. I think the Dakin's moistened gauze is helping. 07-04-2023 upon evaluation today patient appears to be doing well currently in regard to his wounds all things considered. I do believe that he is making headway towards some closure and this is good news. Fortunately I do not see any evidence of active infection looking or systemically which is great news as well. No fevers, chills, nausea, vomiting, or diarrhea. 07-11-2023 upon  evaluation today patient appears to be doing well currently in regard to his wound. He has primary wound at the amputation site is better and the other wounds are all showing signs of improvement as well the worst being the right lateral foot but even that seems better with the Betadine that we switch to last week. 07-18-2023 upon evaluation today patient appears to be doing well currently in regard to his wound. He has been tolerating the dressing changes without complication. Fortunately I do not see any signs of active infection at this time. 07-25-2023 upon evaluation today patient appears to be doing well currently in regard to his wound. He has been taught routine the dressing changes and all 3 wounds look better especially the amputation site. Overall I am extremely pleased with where we stand currently. 08-01-2023 upon evaluation today patient appears to be doing well currently in regard to his wound. Has been tolerating the dressing changes without complication Iodoflex seems to be cleaning up the side wounds although this is very slow the medial looks better than lateral. With the big guard to the primary surgical site this looks much better. 08-08-2023 upon evaluation today patient appears to be doing pretty well currently in regard to the surgical wound although his other 2 wounds are not doing nearly as well. I think we may need to make a switch here what we are doing I think of switching her over to [facility]. Electronic  Signature(s) Signed: 08/08/2023 5:39:51 PM By: Allen Derry PA-C Entered By: Allen Derry on 08/08/2023 17:39:51 ISACC, VANDERKOOI D ([id number]) [id number] Page 4 of 10 -------------------------------------------------------------------------------- Physical Exam Details Patient Name: Date of Service: [name], [name] 08/08/2023 1:15 PM Medical Record Number: [id number] Patient Account Number: [id number] Date of Birth/Sex: Treating RN: [birth year] (82 y.o. [name] Primary Care Provider: Debbra Riding Other Clinician: Referring Provider: Treating Provider/Extender: Jasmine Pang in Treatment: 7 Constitutional Well-nourished and well-hydrated in no acute distress. Respiratory normal breathing without difficulty. Psychiatric this patient is able to make decisions and demonstrates good insight into disease process. Alert and Oriented x 3. pleasant and cooperative. Notes Upon inspection patient's wound bed actually showed signs of good granulation and epithelization at this point. Fortunately I do not see any signs of active infection at this time which is great news and in general I do believe that we are making headway here towards closure. With that being said I think that the surgical wound is the best that until we are still working on trying to get in better shape here. Electronic Signature(s) Signed: 08/08/2023 5:40:15 PM By: Allen Derry PA-C Entered By: Allen Derry on 08/08/2023 17:40:15 -------------------------------------------------------------------------------- Physician Orders Details Patient Name: Date of Service: [name], [place] RD D. 08/08/2023 1:15 PM Medical Record Number: [id number] Patient Account Number: [id number] Date of Birth/Sex: Treating RN: September 28, 1940 (82 y.o. [name] Primary Care Provider: Debbra Riding Other Clinician: Referring Provider: Treating Provider/Extender: Jasmine Pang in Treatment: 7 The following information was scribed by: Midge Aver The information was scribed for: Allen Derry Verbal / Phone Orders: No Diagnosis Coding ICD-10 Coding Code Description T81.31XA Disruption of external operation (surgical) wound, not elsewhere classified, initial encounter E11.621 Type 2 diabetes mellitus with foot ulcer L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 I10 Essential (primary) hypertension 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 KAVEION, NATALE ([id number]) [id number] Page 5 of 10 I25.10 Atherosclerotic heart disease of native  coronary artery without angina pectoris Z79.01 Long term (current) use of anticoagulants 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 Bathing/ [employer] wounds with antibacterial soap and water. May shower; gently cleanse wound with antibacterial soap, rinse and pat dry prior to dressing wounds Anesthetic (Use 'Patient Medications' Section for Anesthetic Order Entry) Lidocaine applied to wound bed Wound Treatment Wound #1 - Amputation Site - Toe Wound Laterality: Right Cleanser: [employer]ram Ancillary Kit - 15 Day Supply (Generic) 1 x Per Day/30 Days Discharge Instructions: Use supplies as instructed; Kit contains: (15) Saline Bullets; (15) 3x3 Gauze; 15 pr Gloves Cleanser: Dakin 16 (oz) 0.25 1 x Per Day/30 Days Discharge Instructions: Use as directed. Prim Dressing: Gauze (Generic) 1 x Per Day/30 Days ary Discharge Instructions: As directed: dry, moistened with saline or moistened with Dakins Solution Secondary Dressing: Conforming Guaze Roll-Large (Generic) 1 x Per Day/30 Days Discharge Instructions: Apply Conforming Stretch Guaze Bandage as directed Secured With: Medipore T - 34M Medipore H Soft Cloth Surgical T ape ape, 2x2 (in/yd) (Generic) 1 x Per Day/30 Days Wound  #2 - Foot Wound Laterality: Plantar, Right Cleanser: Soap and Water 3 x Per Week/30 Days Discharge Instructions: Gently cleanse wound with antibacterial soap, rinse and pat dry prior to dressing wounds Topical: Santyl Collagenase Ointment, 30 (gm), tube 3 x Per Week/30 Days Discharge Instructions: apply nickel thick to wound bed only Prim Dressing: Gauze 3 x Per Week/30 Days ary Discharge Instructions: As directed: dry, moistened with saline or moistened with Dakins Solution Secondary Dressing: ABD Pad 5x9 (in/in) (Generic) 3 x Per Week/30 Days Discharge Instructions: Cover with ABD pad Secured With: Medipore T - 34M Medipore H Soft Cloth Surgical T ape ape, 2x2 (in/yd) (Generic) 3 x Per Week/30 Days Secured With: [employer] Sterile or Non-Sterile 6-ply 4.5x4 (yd/yd) (Generic) 3 x Per Week/30 Days Discharge Instructions: Apply Kerlix as directed Wound #3 - Foot Wound Laterality: Right, Lateral Cleanser: Soap and Water 3 x Per Week/30 Days Discharge Instructions: Gently cleanse wound with antibacterial soap, rinse and pat dry prior to dressing wounds Topical: Santyl Collagenase Ointment, 30 (gm), tube 3 x Per Week/30 Days Discharge Instructions: apply nickel thick to wound bed only Prim Dressing: Gauze 3 x Per Week/30 Days ary Discharge Instructions: As directed: dry, moistened with saline or moistened with Dakins Solution Secondary Dressing: ABD Pad 5x9 (in/in) (Generic) 3 x Per Week/30 Days Discharge Instructions: Cover with ABD pad Secured With: Medipore T - 34M Medipore H Soft Cloth Surgical T ape ape, 2x2 (in/yd) (Generic) 3 x Per Week/30 Days Secured With: [employer] Sterile or Non-Sterile 6-ply 4.5x4 (yd/yd) (Generic) 3 x Per Week/30 Days Discharge Instructions: Apply Kerlix as directed Patient Medications llergies: HIB vaccine, Flu vaccine A Notifications Medication Indication Start End 08/08/2023 Santyl DOSE topical 250 unit/gram ointment - ointment topical once daily apply  nickel thick daily to the wound bed and then cover with a dressing as directed in clinic x 30 days RADCLIFFE, [facility] ([id number]) [id number] Page 6 of 10 Electronic Signature(s) Signed: 08/08/2023 3:52:25 PM By: Allen Derry PA-C Entered By: Allen Derry on 08/08/2023 15:52:25 -------------------------------------------------------------------------------- Problem List Details Patient Name: Date of Service: [name], [name] 08/08/2023 1:15 PM Medical Record Number: [id number] Patient Account Number: [id number] Date of Birth/Sex: Treating RN: 1941-07-02 (82 y.o. [name] Primary Care Provider: Debbra Riding Other Clinician: Referring Provider: Treating Provider/Extender: Jasmine Pang in Treatment: 7 Active Problems ICD-10 Encounter Code  Description Active Date MDM Diagnosis T81.31XA Disruption of external operation (surgical) wound, not elsewhere classified, 06/20/2023 No Yes initial encounter E11.621 Type 2 diabetes mellitus with foot ulcer 06/20/2023 No Yes L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 06/20/2023 No Yes I10 Essential (primary) hypertension 06/20/2023 No Yes 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 06/20/2023 No Yes I25.10 Atherosclerotic heart disease of native coronary artery without angina pectoris 06/20/2023 No Yes Z79.01 Long term (current) use of anticoagulants 06/20/2023 No Yes 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, stage 3 unspecified 06/20/2023 No Yes Inactive Problems Resolved Problems Electronic Signature(s) Signed: 08/08/2023 1:30:02 PM By: Allen Derry PA-C Benedick,Signed: 08/08/2023 1:30:02 PM By: Renee Rival D ([id number]) [id number] Page 7 of 10 Entered By: Allen Derry on 08/08/2023 13:30:02 -------------------------------------------------------------------------------- Progress Note Details Patient Name: Date  of Service: [name], [name] 08/08/2023 1:15 PM Medical Record Number: [id number] Patient Account Number: [id number] Date of Birth/Sex: Treating RN: 1941/09/10 (82 y.o. [name] Primary Care Provider: Debbra Riding Other Clinician: Referring Provider: Treating Provider/Extender: Jasmine Pang in Treatment: 7 Subjective Chief Complaint Information obtained from Patient Right foot ulcers History of Present Illness (HPI) Chronic/Inactive Condition: Notes from Vascular 05/21/23: Right below-the-knee popliteal artery to peroneal artery bypass with reverse saphenous vein graft. This was done on 04/11/2023. Noninvasive studies in our an ABI 0.61 on the right and 0.67 on the left. T oday arterial duplexes were done which shows biphasic/triphasic waveforms throughout the lower extremities down to the level of the tibial vessels. 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 This is suggestive that the patient's bypass is patent although it is not there is no noted occlusion on duplex 1. Atherosclerosis of native arteries of the extremities with ulceration (HCC) Based on noninvasive studies today appears that the patient would have possible marginal ability for wound healing. Concerned that some of the patient is usually a small vessel disease which may proved to have a little bit more difficult. Will have the patient return in several weeks to see if there has been improvement. They are advised that if the wound deteriorates or begins to show signs of starting to contact us for sooner follow-up. 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 With that being said that area is somewhat deep to be  perfectly honest. It does have a little bit concerned. The remaining areas all came from the boot he was wearing and areas that rubbed they have gotten rid of the boot this will not happen again as far as that is concerned. With that being said the patient has had vascular intervention and the note is above as documented in the vascular note. With that being said it does appear that the patient has what is termed "possible marginal ability for wound healing". I think this is basically something this can be quite difficult for [place] to completely close. I am not seeing them possible but again there is a long road ahead. Patient does have a history of diabetes mellitus type 2, hypertension, peripheral vascular disease, coronary artery disease, long-term use of anticoagulants, chronic kidney disease stage III, and as of right now there is no direct evidence of continued and ongoing osteomyelitis although I do question  whether or not there may be an issue here. 06-27-2023 upon evaluation today patient appears to be doing okay in regard to his wounds. He is showing signs of some improvement which is good news although this appears to be a little bit too moist in regard to the wounds on his feet in general. I do believe that he would benefit from a switching dressings to silver alginate away from the Xeroform at this point unfortunately. With that being said the original wound for which she was referred actually does appear to be doing okay at this point in my opinion. I am actually somewhat pleased with where we stand in that regard. I do not see any evidence of active infection at this time which is good. I think the Dakin's moistened gauze is helping. 07-04-2023 upon evaluation today patient appears to be doing well currently in regard to his wounds all things considered. I do believe that he is making headway towards some closure and this is good news. Fortunately I do not see any evidence of active  infection looking or systemically which is great news as well. No fevers, chills, nausea, vomiting, or diarrhea. 07-11-2023 upon evaluation today patient appears to be doing well currently in regard to his wound. He has primary wound at the amputation site is better and the other wounds are all showing signs of improvement as well the worst being the right lateral foot but even that seems better with the Betadine that we switch to last week. 07-18-2023 upon evaluation today patient appears to be doing well currently in regard to his wound. He has been tolerating the dressing changes without complication. Fortunately I do not see any signs of active infection at this time. 07-25-2023 upon evaluation today patient appears to be doing well currently in regard to his wound. He has been taught routine the dressing changes and all 3 wounds look better especially the amputation site. Overall I am extremely pleased with where we stand currently. 08-01-2023 upon evaluation today patient appears to be doing well currently in regard to his wound. Has been tolerating the dressing changes without complication Iodoflex seems to be cleaning up the side wounds although this is very slow the medial looks better than lateral. With the big guard to the primary surgical site this looks much better. 08-08-2023 upon evaluation today patient appears to be doing pretty well currently in regard to the surgical wound although his other 2 wounds are not doing nearly as well. I think we may need to make a switch here what we are doing I think of switching her over to [facility]. ZYIERE, BODKINS ([id number]) [id number] Page 8 of 10 Objective Constitutional Well-nourished and well-hydrated in no acute distress. Vitals Time Taken: 1:25 PM, Height: 68 in, Weight: 167 lbs, BMI: 25.4, Temperature: 97.8 F, Pulse: 44 bpm, Respiratory Rate: 18 breaths/min, Blood Pressure: 162/67 mmHg. Respiratory normal  breathing without difficulty. Psychiatric this patient is able to make decisions and demonstrates good insight into disease process. Alert and Oriented x 3. pleasant and cooperative. General Notes: Upon inspection patient's wound bed actually showed signs of good granulation and epithelization at this point. Fortunately I do not see any signs of active infection at this time which is great news and in general I do believe that we are making headway here towards closure. With that being said I think that the surgical wound is the best that until we are still working on trying to get in better shape here. Integumentary (Hair,  Skin) Wound #1 status is Open. Original cause of wound was Surgical Injury. The date acquired was: 04/16/2023. The wound has been in treatment 7 weeks. The wound is located on the Right Amputation Site - T The wound measures 1cm length x 0.6cm width x 1.4cm depth; 0.471cm^2 area and 0.66cm^3 volume. oe. There is Fat Layer (Subcutaneous Tissue) exposed. There is a medium amount of serosanguineous drainage noted. There is medium (34-66%) pink granulation within the wound bed. Wound #2 status is Open. Original cause of wound was Footwear Injury. The date acquired was: 06/16/2023. The wound has been in treatment 7 weeks. The wound is located on the Right,Plantar Foot. The wound measures 1cm length x 1cm width x 0.1cm depth; 0.785cm^2 area and 0.079cm^3 volume. There is a medium amount of serosanguineous drainage noted. There is medium (34-66%) red, pink granulation within the wound bed. There is no necrotic tissue within the wound bed. Wound #3 status is Open. Original cause of wound was Footwear Injury. The date acquired was: 06/16/2023. The wound has been in treatment 7 weeks. The wound is located on the Right,Lateral Foot. The wound measures 1.9cm length x 1.5cm width x 0.3cm depth; 2.238cm^2 area and 0.672cm^3 volume. There is a medium amount of serosanguineous drainage noted.  There is medium (34-66%) pink, pale granulation within the wound bed. There is no necrotic tissue within the wound bed. Assessment Active Problems ICD-10 Disruption of external operation (surgical) wound, not elsewhere classified, initial encounter Type 2 diabetes mellitus with foot ulcer Non-pressure chronic ulcer of other part of right foot with fat layer exposed Essential (primary) hypertension Other specified peripheral vascular diseases Atherosclerotic heart disease of native coronary artery without angina pectoris Long term (current) use of anticoagulants Chronic kidney disease, stage 3 unspecified Procedures Wound #2 Pre-procedure diagnosis of Wound #2 is a Diabetic Wound/Ulcer of the Lower Extremity located on the Right,Plantar Foot .Severity of Tissue Pre Debridement is: Fat layer exposed. There was a Chemical/Enzymatic/Mechanical debridement performed by Allen Derry, PA-C. With the following instrument(s): santyl. Agent used was [employer]. A time out was conducted at 14:10, prior to the start of the procedure. There was no bleeding. The procedure was tolerated well with a pain level of 0 throughout and a pain level of 0 following the procedure. Post Debridement Measurements: 1cm length x 1cm width x 0.1cm depth; 0.079cm^3 volume. Character of Wound/Ulcer Post Debridement is stable. Severity of Tissue Post Debridement is: Fat layer exposed. Post procedure Diagnosis Wound #2: Same as Pre-Procedure Wound #3 Pre-procedure diagnosis of Wound #3 is a Diabetic Wound/Ulcer of the Lower Extremity located on the Right,Lateral Foot .Severity of Tissue Pre Debridement is: Fat layer exposed. There was a Chemical/Enzymatic/Mechanical debridement performed by Allen Derry, PA-C. With the following instrument(s): santyl. Agent used was [employer]. A time out was conducted at 14:10, prior to the start of the procedure. There was no bleeding. The procedure was tolerated well with a pain level of 0  throughout and a pain level of 0 following the procedure. Post Debridement Measurements: 1.9cm length x 1.5cm width x 0.3cm depth; 0.672cm^3 volume. Character of Wound/Ulcer Post Debridement is stable. Severity of Tissue Post Debridement is: Fat layer exposed. Post procedure Diagnosis Wound #3: Same as Pre-Procedure Plan LINDAN, BROTHERS ([id number]) [id number] Page 9 of 10 Follow-up Appointments: Return Appointment in 1 week. Bathing/ Shower/ Hygiene: Wash wounds with antibacterial soap and water. May shower; gently cleanse wound with antibacterial soap, rinse and pat dry prior to dressing wounds Anesthetic (Use 'Patient Medications' Section  for Anesthetic Order Entry): Lidocaine applied to wound bed The following medication(s) was prescribed: Santyl topical 250 unit/gram ointment ointment topical once daily apply nickel thick daily to the wound bed and then cover with a dressing as directed in clinic x 30 days starting 08/08/2023 WOUND #1: - Amputation Site - T oe Wound Laterality: Right Cleanser: [employer]ram Ancillary Kit - 15 Day Supply (Generic) 1 x Per Day/30 Days Discharge Instructions: Use supplies as instructed; Kit contains: (15) Saline Bullets; (15) 3x3 Gauze; 15 pr Gloves Cleanser: Dakin 16 (oz) 0.25 1 x Per Day/30 Days Discharge Instructions: Use as directed. Prim Dressing: Gauze (Generic) 1 x Per Day/30 Days ary Discharge Instructions: As directed: dry, moistened with saline or moistened with Dakins Solution Secondary Dressing: Conforming Guaze Roll-Large (Generic) 1 x Per Day/30 Days Discharge Instructions: Apply Conforming Stretch Guaze Bandage as directed Secured With: Medipore T - 82M Medipore H Soft Cloth Surgical T ape ape, 2x2 (in/yd) (Generic) 1 x Per Day/30 Days WOUND #2: - Foot Wound Laterality: Plantar, Right Cleanser: Soap and Water 3 x Per Week/30 Days Discharge Instructions: Gently cleanse wound with antibacterial soap, rinse and pat dry  prior to dressing wounds Topical: Santyl Collagenase Ointment, 30 (gm), tube 3 x Per Week/30 Days Discharge Instructions: apply nickel thick to wound bed only Prim Dressing: Gauze 3 x Per Week/30 Days ary Discharge Instructions: As directed: dry, moistened with saline or moistened with Dakins Solution Secondary Dressing: ABD Pad 5x9 (in/in) (Generic) 3 x Per Week/30 Days Discharge Instructions: Cover with ABD pad Secured With: Medipore T - 82M Medipore H Soft Cloth Surgical T ape ape, 2x2 (in/yd) (Generic) 3 x Per Week/30 Days Secured With: [employer] Sterile or Non-Sterile 6-ply 4.5x4 (yd/yd) (Generic) 3 x Per Week/30 Days Discharge Instructions: Apply Kerlix as directed WOUND #3: - Foot Wound Laterality: Right, Lateral Cleanser: Soap and Water 3 x Per Week/30 Days Discharge Instructions: Gently cleanse wound with antibacterial soap, rinse and pat dry prior to dressing wounds Topical: Santyl Collagenase Ointment, 30 (gm), tube 3 x Per Week/30 Days Discharge Instructions: apply nickel thick to wound bed only Prim Dressing: Gauze 3 x Per Week/30 Days ary Discharge Instructions: As directed: dry, moistened with saline or moistened with Dakins Solution Secondary Dressing: ABD Pad 5x9 (in/in) (Generic) 3 x Per Week/30 Days Discharge Instructions: Cover with ABD pad Secured With: Medipore T - 82M Medipore H Soft Cloth Surgical T ape ape, 2x2 (in/yd) (Generic) 3 x Per Week/30 Days Secured With: [employer] Sterile or Non-Sterile 6-ply 4.5x4 (yd/yd) (Generic) 3 x Per Week/30 Days Discharge Instructions: Apply Kerlix as directed 1. I would recommend that we have the patient continue to monitor for any signs of infection or worsening. Based on what I see I do believe that we are making really good headway here towards closure. 2. I am going to recommend as well that the patient should continue to monitor for any evidence of infection. Based on what I am seeing I do not see this infected but I do  think that we need to get a cleaner in order to allow this to heal appropriately. 3. I did send in a prescription for Santyl for him working to see how this does. We will see patient back for reevaluation in 2 weeks here in the clinic. If anything worsens or changes patient will contact our office for additional recommendations. 1 Electronic Signature(s) Signed: 08/08/2023 5:41:07 PM By: Allen Derry PA-C Entered By: Allen Derry on 08/08/2023 17:41:07 -------------------------------------------------------------------------------- SuperBill Details Patient  Name: Date of Service: [name], [name] 08/08/2023 Medical Record Number: [id number] Patient Account Number: [id number] Date of Birth/Sex: Treating RN: Sep 07, 1941 (82 y.o. [name] Primary Care Provider: Debbra Riding Other Clinician: Referring Provider: Treating Provider/Extender: Jasmine Pang in Treatment: [address] ([id number]) [id number] Page 10 of 10 Diagnosis Coding ICD-10 Codes Code Description T81.31XA Disruption of external operation (surgical) wound, not elsewhere classified, initial encounter E11.621 Type 2 diabetes mellitus with foot ulcer L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 I10 Essential (primary) hypertension 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 Z79.01 Long term (current) use of anticoagulants 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, stage 3 unspecified Facility Procedures : CPT4 Code: [id number] Description: 60630 - DEBRIDE W/O ANES NON SELECT Modifier: Quantity: 1 Physician Procedures : CPT4 Code Description Modifier [id number] - WC PHYS LEVEL 4 - EST PT ICD-10 Diagnosis Description T81.31XA Disruption of external operation (surgical) wound, not elsewhere classified, initial encounter E11.621 Type 2  diabetes mellitus with foot  ulcer L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 I10 Essential (primary) hypertension Quantity: 1 Electronic Signature(s) Signed: 08/08/2023 5:41:33 PM By: Allen Derry PA-C Entered By: Allen Derry on 08/08/2023 17:41:32

## 2023-08-09 NOTE — Progress Notes (Signed)
FAUSTIN, OLINSKI ([id number]) [id number] Page 1 of 11 Visit Report for 08/08/2023 Arrival Information Details Patient Name: Date of Service: [name], [name] 08/08/2023 1:15 PM Medical Record Number: [id number] Patient Account Number: [id number] Date of Birth/Sex: Treating RN: 1941/06/19 (82 y.o. [name] Primary Care Ksenia Kunz: Debbra Riding Other Clinician: Referring Baila Rouse: Treating Jamire Shabazz/Extender: Jasmine Pang in Treatment: 7 Visit Information History Since Last Visit Added or deleted any medications: No Patient Arrived: [name] Any new allergies or adverse reactions: No Arrival Time: 13:16 Has Dressing in Place as Prescribed: Yes Accompanied By: daughter in law Pain Present Now: No Transfer Assistance: None Patient Identification Verified: Yes Secondary Verification Process Completed: Yes Patient Requires Transmission-Based Precautions: No Patient Has Alerts: Yes Patient Alerts: Patient on Blood Thinner diabetes type 2 Plavix Electronic Signature(s) Signed: 08/09/2023 12:46:12 PM By: Midge Aver MSN RN CNS WTA Entered By: Midge Aver on 08/08/2023 13:23:28 -------------------------------------------------------------------------------- Clinic Level of Care Assessment Details Patient Name: Date of Service: [name], [name] 08/08/2023 1:15 PM Medical Record Number: [id number] Patient Account Number: [id number] Date of Birth/Sex: Treating RN: [birth year] (82 y.o. [name] Primary Care Ece Cumberland: Debbra Riding Other Clinician: Referring Tawnia Schirm: Treating Emmarie Sannes/Extender: Jasmine Pang in Treatment: 7 Clinic Level of Care Assessment Items TOOL 1 Quantity Score []  - 0 Use when EandM and Procedure is performed on INITIAL visit ASSESSMENTS - Nursing Assessment / Reassessment []  - 0 General Physical Exam (combine w/ comprehensive assessment (listed just below) when performed  on new pt. evals) []  - 0 Comprehensive Assessment (HX, ROS, Risk Assessments, Wounds Hx, etc.) ASSESSMENTS - Wound and Skin Assessment / Reassessment []  - 0 Dermatologic / Skin Assessment (not related to wound area) [name], [name] ([id number]) [id number] Page 2 of 11 ASSESSMENTS - Ostomy and/or Continence Assessment and Care []  - 0 Incontinence Assessment and Management []  - 0 Ostomy Care Assessment and Management (repouching, etc.) PROCESS - Coordination of Care []  - 0 Simple Patient / Family Education for ongoing care []  - 0 Complex (extensive) Patient / Family Education for ongoing care []  - 0 Staff obtains Chiropractor, Records, T Results / Process Orders est []  - 0 Staff telephones HHA, Nursing Homes / Clarify orders / etc []  - 0 Routine Transfer to another Facility (non-emergent condition) []  - 0 Routine Hospital Admission (non-emergent condition) []  - 0 New Admissions / Manufacturing engineer / Ordering NPWT Apligraf, etc. , []  - 0 Emergency Hospital Admission (emergent condition) PROCESS - Special Needs []  - 0 Pediatric / Minor Patient Management []  - 0 Isolation Patient Management []  - 0 Hearing / Language / Visual special needs []  - 0 Assessment of Community assistance (transportation, D/C planning, etc.) []  - 0 Additional assistance / Altered mentation []  - 0 Support Surface(s) Assessment (bed, cushion, seat, etc.) INTERVENTIONS - Miscellaneous []  - 0 External ear exam []  - 0 Patient Transfer (multiple staff / Nurse, adult / Similar devices) []  - 0 Simple Staple / Suture removal (25 or less) []  - 0 Complex Staple / Suture removal (26 or more) []  - 0 Hypo/Hyperglycemic Management (do not check if billed separately) []  - 0 Ankle / Brachial Index (ABI) - do not check if billed separately Has the patient been seen at the hospital within the last three years: Yes Total Score: 0 Level Of Care: ____ Electronic  Signature(s) Signed: 08/09/2023 12:46:12 PM By: Midge Aver MSN RN CNS WTA Entered By: Midge Aver on 08/08/2023 14:18:19 -------------------------------------------------------------------------------- Encounter Discharge  Information Details Patient Name: Date of Service: [name], [name] 08/08/2023 1:15 PM Medical Record Number: [id number] Patient Account Number: [id number] Date of Birth/Sex: Treating RN: November 28, 1940 (82 y.o. [name] Primary Care Kejon Feild: Debbra Riding Other Clinician: Referring Milayna Rotenberg: Treating Reality Dejonge/Extender: Jasmine Pang in Treatment: 7 Encounter Discharge Information Items Post Procedure [name], [name] ([id number]) [id number] Page 3 of 11 Discharge Condition: Stable Temperature (F): 97.8 Ambulatory Status: Wheelchair Pulse (bpm): 44 Discharge Destination: Home Respiratory Rate (breaths/min): 18 Transportation: Private Auto Blood Pressure (mmHg): 162/67 Accompanied By: daughter in law Schedule Follow-up Appointment: Yes Clinical Summary of Care: Electronic Signature(s) Signed: 08/09/2023 12:46:12 PM By: Midge Aver MSN RN CNS WTA Entered By: Midge Aver on 08/08/2023 14:19:52 -------------------------------------------------------------------------------- Lower Extremity Assessment Details Patient Name: Date of Service: [name], [name] 08/08/2023 1:15 PM Medical Record Number: [id number] Patient Account Number: [id number] Date of Birth/Sex: Treating RN: November 16, 1940 (82 y.o. [name] Primary Care Renwick Asman: Debbra Riding Other Clinician: Referring Mardene Lessig: Treating Timberlynn Kizziah/Extender: Jasmine Pang in Treatment: 7 Electronic Signature(s) Signed: 08/09/2023 12:46:12 PM By: Midge Aver MSN RN CNS WTA Entered By: Midge Aver on 08/08/2023 13:40:50 -------------------------------------------------------------------------------- Multi Wound  Chart Details Patient Name: Date of Service: [name], [name] RD D. 08/08/2023 1:15 PM Medical Record Number: [id number] Patient Account Number: [id number] Date of Birth/Sex: Treating RN: 21-Feb-1941 (82 y.o. [name] Primary Care Paz Winsett: Debbra Riding Other Clinician: Referring Chriss Mannan: Treating Masiel Gentzler/Extender: Jasmine Pang in Treatment: 7 Vital Signs Height(in): 68 Pulse(bpm): 44 Weight(lbs): 167 Blood Pressure(mmHg): 162/67 Body Mass Index(BMI): 25.4 Temperature(F): 97.8 Respiratory Rate(breaths/min): 18 [1:Photos:] [3:[id number] Page 4 of 11] Right Amputation Site - Toe Right, Plantar Foot Right, Lateral Foot Wound Location: Surgical Injury Footwear Injury Footwear Injury Wounding Event: Dehisced Wound Diabetic Wound/Ulcer of the Lower Diabetic Wound/Ulcer of the Lower Primary Etiology: Extremity Extremity Congestive Heart Failure, Congestive Heart Failure, Congestive Heart Failure, Comorbid History: Hypertension, Peripheral Venous Hypertension, Peripheral Venous Hypertension, Peripheral Venous Disease, Type II Diabetes, Disease, Type II Diabetes, Disease, Type II Diabetes, Neuropathy Neuropathy Neuropathy 04/16/2023 06/16/2023 06/16/2023 Date Acquired: 7 7 7  Weeks of Treatment: Open Open Open Wound Status: No No No Wound Recurrence: 1x0.6x1.4 1x1x0.1 1.9x1.5x0.3 Measurements L x W x D (cm) 0.471 0.785 2.238 A (cm) : rea 0.66 0.079 0.672 Volume (cm) : 60.00% -103.90% -42.50% % Reduction in Area: 56.90% -107.90% -328.00% % Reduction in Volume: Full Thickness Without Exposed Grade 1 Grade 2 Classification: Support Structures Medium Medium Medium Exudate Amount: Serosanguineous Serosanguineous Serosanguineous Exudate Type: red, brown red, brown red, brown Exudate Color: Medium (34-66%) Medium (34-66%) Medium (34-66%) Granulation Amount: Pink Red, Pink Pink, Pale Granulation Quality: N/A None  Present (0%) None Present (0%) Necrotic Amount: Fat Layer (Subcutaneous Tissue): Yes Fascia: No Fascia: No Exposed Structures: Fascia: No Fat Layer (Subcutaneous Tissue): No Fat Layer (Subcutaneous Tissue): No Tendon: No Tendon: No Tendon: No Muscle: No Muscle: No Muscle: No Joint: No Joint: No Joint: No Bone: No Bone: No Bone: No None Small (1-33%) None Epithelialization: Treatment Notes Electronic Signature(s) Signed: 08/09/2023 12:46:12 PM By: Midge Aver MSN RN CNS WTA Entered By: Midge Aver on 08/08/2023 13:41:22 -------------------------------------------------------------------------------- Multi-Disciplinary Care Plan Details Patient Name: Date of Service: [name], [name] RD D. 08/08/2023 1:15 PM Medical Record Number: [id number] Patient Account Number: [id number] Date of Birth/Sex: Treating RN: [birth year] (82 y.o. [name] Primary Care Jodene Polyak: Debbra Riding Other Clinician: Referring Loveda Colaizzi: Treating Shep Porter/Extender: Simeon Craft,  Jason Weeks in Treatment: 7 Active Inactive Wound/Skin Impairment Nursing Diagnoses: Impaired tissue integrity Knowledge deficit related to ulceration/compromised skin integrity HAMISH, TRAYER ([id number]) [id number] Page 5 of 11 Goals: Patient/caregiver will verbalize understanding of skin care regimen Date Initiated: 06/20/2023 Date Inactivated: 07/18/2023 Target Resolution Date: 07/21/2023 Goal Status: Met Ulcer/skin breakdown will have a volume reduction of 30% by week 4 Date Initiated: 06/20/2023 Date Inactivated: 07/18/2023 Target Resolution Date: 07/21/2023 Goal Status: Met Ulcer/skin breakdown will have a volume reduction of 50% by week 8 Date Initiated: 06/20/2023 Date Inactivated: 08/08/2023 Target Resolution Date: 08/20/2023 Goal Status: Met Ulcer/skin breakdown will have a volume reduction of 80% by week 12 Date Initiated: 06/20/2023 Target Resolution Date:  09/20/2023 Goal Status: Active Ulcer/skin breakdown will heal within 14 weeks Date Initiated: 06/20/2023 Target Resolution Date: 10/04/2023 Goal Status: Active Interventions: Assess patient/caregiver ability to obtain necessary supplies Assess patient/caregiver ability to perform ulcer/skin care regimen upon admission and as needed Assess ulceration(s) every visit Provide education on ulcer and skin care Treatment Activities: Referred to DME Tonantzin Mimnaugh for dressing supplies : 06/20/2023 Skin care regimen initiated : 06/20/2023 Topical wound management initiated : 06/20/2023 Notes: Electronic Signature(s) Signed: 08/09/2023 12:46:12 PM By: Midge Aver MSN RN CNS WTA Entered By: Midge Aver on 08/08/2023 14:18:33 -------------------------------------------------------------------------------- Pain Assessment Details Patient Name: Date of Service: [name], [place] RD D. 08/08/2023 1:15 PM Medical Record Number: [id number] Patient Account Number: [id number] Date of Birth/Sex: Treating RN: [birth year] (82 y.o. [name] Primary Care Raiya Stainback: Debbra Riding Other Clinician: Referring Theodoros Stjames: Treating Masin Shatto/Extender: Jasmine Pang in Treatment: 7 Active Problems Location of Pain Severity and Description of Pain Patient Has Paino No Site Locations [place], [place] D ([id number]) [id number] Page 6 of 11 Pain Management and Medication Current Pain Management: Electronic Signature(s) Signed: 08/09/2023 12:46:12 PM By: Midge Aver MSN RN CNS WTA Entered By: Midge Aver on 08/08/2023 13:26:43 -------------------------------------------------------------------------------- Patient/Caregiver Education Details Patient Name: Date of Service: [name], [name] RD D. 11/21/2024andnbsp1:15 PM Medical Record Number: [id number] Patient Account Number: [id number] Date of Birth/Gender: Treating RN: 01/19/1941 (82 y.o. [name] Primary Care  Physician: Debbra Riding Other Clinician: Referring Physician: Treating Physician/Extender: Jasmine Pang in Treatment: 7 Education Assessment Education Provided To: Patient Education Topics Provided Wound/Skin Impairment: Handouts: Caring for Your Ulcer Methods: Explain/Verbal Responses: State content correctly Electronic Signature(s) Signed: 08/09/2023 12:46:12 PM By: Midge Aver MSN RN CNS WTA Entered By: Midge Aver on 08/08/2023 14:18:45 [place] D ([id number]) [id number] Page 7 of 11 -------------------------------------------------------------------------------- Wound Assessment Details Patient Name: Date of Service: [name], [name] 08/08/2023 1:15 PM Medical Record Number: [id number] Patient Account Number: [id number] Date of Birth/Sex: Treating RN: May 12, 1941 (82 y.o. [name] Primary Care Elliott Quade: Debbra Riding Other Clinician: Referring Tamiya Colello: Treating Samarrah Tranchina/Extender: Jasmine Pang in Treatment: 7 Wound Status Wound Number: 1 Primary Dehisced Wound Etiology: Wound Location: Right Amputation Site - Toe Wound Open Wounding Event: Surgical Injury Status: Date Acquired: 04/16/2023 Comorbid Congestive Heart Failure, Hypertension, Peripheral Venous Weeks Of Treatment: 7 History: Disease, Type II Diabetes, Neuropathy Clustered Wound: No Photos Wound Measurements Length: (cm) 1 Width: (cm) 0.6 Depth: (cm) 1.4 Area: (cm) 0.471 Volume: (cm) 0.66 % Reduction in Area: 60% % Reduction in Volume: 56.9% Epithelialization: None Wound Description Classification: Full Thickness Without Exposed Support Structures Exudate Amount: Medium Exudate Type: Serosanguineous Exudate Color: red, brown Foul Odor After Cleansing: No Slough/Fibrino No Wound Bed Granulation Amount: Medium (34-66%) Exposed Structure Granulation  Quality: Pink Fascia Exposed: No Fat Layer (Subcutaneous  Tissue) Exposed: Yes Tendon Exposed: No Muscle Exposed: No Joint Exposed: No Bone Exposed: No Treatment Notes Wound #1 (Amputation Site - Toe) Wound Laterality: Right Cleanser Byram Ancillary Kit - 15 Day Supply Discharge Instruction: Use supplies as instructed; Kit contains: (15) Saline Bullets; (15) 3x3 Gauze; 15 pr Gloves Dakin 16 (oz) 0.25 NNAMDI, DREWS D ([id number]) [id number] Page 8 of 11 Discharge Instruction: Use as directed. Peri-Wound Care Topical Primary Dressing Gauze Discharge Instruction: As directed: dry, moistened with saline or moistened with Dakins Solution Secondary Dressing Conforming Guaze Roll-Large Discharge Instruction: Apply Conforming Stretch Guaze Bandage as directed Secured With Medipore T - 4M Medipore H Soft Cloth Surgical T ape ape, 2x2 (in/yd) Compression Wrap Compression Stockings Add-Ons Electronic Signature(s) Signed: 08/09/2023 12:46:12 PM By: Midge Aver MSN RN CNS WTA Entered By: Midge Aver on 08/08/2023 13:39:17 -------------------------------------------------------------------------------- Wound Assessment Details Patient Name: Date of Service: [name], [place] RD D. 08/08/2023 1:15 PM Medical Record Number: [id number] Patient Account Number: [id number] Date of Birth/Sex: Treating RN: 12-19-1940 (82 y.o. [name] Primary Care Jaycee Mckellips: Debbra Riding Other Clinician: Referring Arpan Eskelson: Treating Shonice Wrisley/Extender: Jasmine Pang in Treatment: 7 Wound Status Wound Number: 2 Primary Diabetic Wound/Ulcer of the Lower Extremity Etiology: Wound Location: Right, Plantar Foot Wound Open Wounding Event: Footwear Injury Status: Date Acquired: 06/16/2023 Comorbid Congestive Heart Failure, Hypertension, Peripheral Venous Weeks Of Treatment: 7 History: Disease, Type II Diabetes, Neuropathy Clustered Wound: No Photos Wound Measurements Length: (cm) 1 Width: (cm) 1 Depth: (cm)  0.1 Leamy, Travus D ([id number]) Area: (cm) 0.785 Volume: (cm) 0.079 % Reduction in Area: -103.9% % Reduction in Volume: -107.9% Epithelialization: Small (1-33%) [id number] Page 9 of 11 Wound Description Classification: Grade 1 Exudate Amount: Medium Exudate Type: Serosanguineous Exudate Color: red, brown Foul Odor After Cleansing: No Slough/Fibrino No Wound Bed Granulation Amount: Medium (34-66%) Exposed Structure Granulation Quality: Red, Pink Fascia Exposed: No Necrotic Amount: None Present (0%) Fat Layer (Subcutaneous Tissue) Exposed: No Tendon Exposed: No Muscle Exposed: No Joint Exposed: No Bone Exposed: No Treatment Notes Wound #2 (Foot) Wound Laterality: Plantar, Right Cleanser Soap and Water Discharge Instruction: Gently cleanse wound with antibacterial soap, rinse and pat dry prior to dressing wounds Peri-Wound Care Topical Santyl Collagenase Ointment, 30 (gm), tube Discharge Instruction: apply nickel thick to wound bed only Primary Dressing Gauze Discharge Instruction: As directed: dry, moistened with saline or moistened with Dakins Solution Secondary Dressing ABD Pad 5x9 (in/in) Discharge Instruction: Cover with ABD pad Secured With Medipore T - 4M Medipore H Soft Cloth Surgical T ape ape, 2x2 (in/yd) Kerlix Roll Sterile or Non-Sterile 6-ply 4.5x4 (yd/yd) Discharge Instruction: Apply Kerlix as directed Compression Wrap Compression Stockings Add-Ons Electronic Signature(s) Signed: 08/09/2023 12:46:12 PM By: Midge Aver MSN RN CNS WTA Entered By: Midge Aver on 08/08/2023 13:39:43 -------------------------------------------------------------------------------- Wound Assessment Details Patient Name: Date of Service: [name], [place] RD D. 08/08/2023 1:15 PM Medical Record Number: [id number] Patient Account Number: [id number] Date of Birth/Sex: Treating RN: October 08, 1940 (82 y.o. [name] Primary Care Kadia Abaya: Debbra Riding Other Clinician: Referring Sofi Bryars: Treating Latashia Koch/Extender: Worthy Flank [place] D ([id number]) [phone].pdf Page 10 of 11 Weeks in Treatment: 7 Wound Status Wound Number: 3 Primary Diabetic Wound/Ulcer of the Lower Extremity Etiology: Wound Location: Right, Lateral Foot Wound Open Wounding Event: Footwear Injury Status: Date Acquired: 06/16/2023 Comorbid Congestive Heart Failure, Hypertension, Peripheral Venous Weeks Of Treatment: 7 History: Disease, Type II Diabetes, Neuropathy Clustered Wound:  No Photos Wound Measurements Length: (cm) 1.9 Width: (cm) 1.5 Depth: (cm) 0.3 Area: (cm) 2.238 Volume: (cm) 0.672 % Reduction in Area: -42.5% % Reduction in Volume: -328% Epithelialization: None Wound Description Classification: Grade 2 Exudate Amount: Medium Exudate Type: Serosanguineous Exudate Color: red, brown Foul Odor After Cleansing: No Slough/Fibrino No Wound Bed Granulation Amount: Medium (34-66%) Exposed Structure Granulation Quality: Pink, Pale Fascia Exposed: No Necrotic Amount: None Present (0%) Fat Layer (Subcutaneous Tissue) Exposed: No Tendon Exposed: No Muscle Exposed: No Joint Exposed: No Bone Exposed: No Treatment Notes Wound #3 (Foot) Wound Laterality: Right, Lateral Cleanser Soap and Water Discharge Instruction: Gently cleanse wound with antibacterial soap, rinse and pat dry prior to dressing wounds Peri-Wound Care Topical Santyl Collagenase Ointment, 30 (gm), tube Discharge Instruction: apply nickel thick to wound bed only Primary Dressing Gauze Discharge Instruction: As directed: dry, moistened with saline or moistened with Dakins Solution Secondary Dressing ABD Pad 5x9 (in/in) Discharge Instruction: Cover with ABD pad Secured With Medipore T - 109M Medipore H Soft Cloth Surgical T ape ape, 2x2 (in/yd) Kerlix Roll Sterile or Non-Sterile 6-ply 4.5x4 (yd/yd) Discharge Instruction: Apply  Kerlix as directed SRICHARAN, T[place] D ([id number]) [id number] Page 11 of 11 Compression Wrap Compression Stockings Add-Ons Electronic Signature(s) Signed: 08/09/2023 12:46:12 PM By: Midge Aver MSN RN CNS WTA Entered By: Midge Aver on 08/08/2023 13:40:31 -------------------------------------------------------------------------------- Vitals Details Patient Name: Date of Service: [name], [place] RD D. 08/08/2023 1:15 PM Medical Record Number: [id number] Patient Account Number: [id number] Date of Birth/Sex: Treating RN: 11/15/1940 (82 y.o. [name] Primary Care Edith Groleau: Debbra Riding Other Clinician: Referring Candia Kingsbury: Treating Cortlynn Hollinsworth/Extender: Jasmine Pang in Treatment: 7 Vital Signs Time Taken: 13:25 Temperature (F): 97.8 Height (in): 68 Pulse (bpm): 44 Weight (lbs): 167 Respiratory Rate (breaths/min): 18 Body Mass Index (BMI): 25.4 Blood Pressure (mmHg): 162/67 Reference Range: 80 - 120 mg / dl Electronic Signature(s) Signed: 08/09/2023 12:46:12 PM By: Midge Aver MSN RN CNS WTA Entered By: Midge Aver on 08/08/2023 13:26:35

## 2023-08-13 ENCOUNTER — Ambulatory Visit: Payer: Medicare Other

## 2023-08-20 ENCOUNTER — Encounter: Payer: Medicare Other | Attending: Physician Assistant | Admitting: Physician Assistant

## 2023-08-20 DIAGNOSIS — E1122 Type 2 diabetes mellitus with diabetic chronic kidney disease: Secondary | ICD-10-CM | POA: Diagnosis not present

## 2023-08-20 DIAGNOSIS — Z7901 Long term (current) use of anticoagulants: Secondary | ICD-10-CM | POA: Insufficient documentation

## 2023-08-20 DIAGNOSIS — I130 Hypertensive heart and chronic kidney disease with heart failure and stage 1 through stage 4 chronic kidney disease, or unspecified chronic kidney disease: Secondary | ICD-10-CM | POA: Diagnosis not present

## 2023-08-20 DIAGNOSIS — E1140 Type 2 diabetes mellitus with diabetic neuropathy, unspecified: Secondary | ICD-10-CM | POA: Diagnosis not present

## 2023-08-20 DIAGNOSIS — I509 Heart failure, unspecified: Secondary | ICD-10-CM | POA: Insufficient documentation

## 2023-08-20 DIAGNOSIS — T8131XA Disruption of external operation (surgical) wound, not elsewhere classified, initial encounter: Secondary | ICD-10-CM | POA: Diagnosis present

## 2023-08-20 DIAGNOSIS — Z89421 Acquired absence of other right toe(s): Secondary | ICD-10-CM | POA: Insufficient documentation

## 2023-08-20 DIAGNOSIS — X58XXXA Exposure to other specified factors, initial encounter: Secondary | ICD-10-CM | POA: Insufficient documentation

## 2023-08-20 DIAGNOSIS — L97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 ulcer of other part of right foot with fat layer exposed: Secondary | ICD-10-CM | POA: Diagnosis not present

## 2023-08-20 DIAGNOSIS — I2510 Atherosclerotic heart disease of native coronary artery without angina pectoris: Secondary | ICD-10-CM | POA: Insufficient documentation

## 2023-08-20 DIAGNOSIS — L97513 Non-pressure chronic ulcer of other part of right foot with necrosis of muscle: Secondary | ICD-10-CM | POA: Diagnosis not present

## 2023-08-20 DIAGNOSIS — E11621 Type 2 diabetes mellitus with foot ulcer: Secondary | ICD-10-CM | POA: Diagnosis not present

## 2023-08-20 DIAGNOSIS — E1151 Type 2 diabetes mellitus with diabetic peripheral angiopathy without gangrene: Secondary | ICD-10-CM | POA: Insufficient documentation

## 2023-08-20 DIAGNOSIS — N183 Chronic kidney disease, stage 3 unspecified: Secondary | ICD-10-CM | POA: Insufficient documentation

## 2023-08-21 NOTE — Progress Notes (Addendum)
[name] ([id number]) [id number] Page 1 of 11 Visit Report for 08/20/2023 Arrival Information Details Patient Name: Date of Service: [name], Charles [place] RD D. 08/20/2023 1:15 PM Medical Record Number: [id number] Patient Account Number: [id number] Date of Birth/Sex: Treating RN: [birth year] (82 y.o. [name] Primary Care [name]: [name] Other Clinician: Referring [name]: Treating [name]/Extender: [name] in Treatment: 8 Visit Information History Since Last Visit Added or deleted any medications: No Patient Arrived: Wheel Chair Any new allergies or adverse reactions: No Arrival Time: 13:30 Had a fall or experienced change in No Accompanied By: daughter in law activities of daily living that may affect Transfer Assistance: None risk of falls: Patient Identification Verified: Yes Hospitalized since last visit: No Secondary Verification Process Completed: Yes Has Dressing in Place as Prescribed: Yes Patient Requires Transmission-Based Precautions: No Pain Present Now: No Patient Has Alerts: Yes Patient Alerts: Patient on Blood Thinner diabetes type 2 Plavix Electronic Signature(s) Signed: 08/21/2023 10:18:33 AM By: Charles Aver MSN RN CNS WTA Entered By: [name] on 08/21/2023 07:18:32 -------------------------------------------------------------------------------- Clinic Level of Care Assessment Details Patient Name: Date of Service: [name], Charles [place] RD D. 08/20/2023 1:15 PM Medical Record Number: [id number] Patient Account Number: [id number] Date of Birth/Sex: Treating RN: 06-26-1941 (82 y.o. [name] Primary Care [name]: [name] Other Clinician: Referring [name]: Treating [name]/Extender: [name] in Treatment: 8 Clinic Level of Care Assessment Items TOOL 4 Quantity Score X- 1 0 Use when only an EandM is performed on FOLLOW-UP visit ASSESSMENTS -  Nursing Assessment / Reassessment X- 1 10 Reassessment of Co-morbidities (includes updates in patient status) X- 1 5 Reassessment of Adherence to Treatment Plan ASSESSMENTS - Wound and Skin A ssessment / Reassessment []  - 0 Simple Wound Assessment / Reassessment - one wound [name] ([id number]) [id number] Page 2 of 11 X- 3 5 Complex Wound Assessment / Reassessment - multiple wounds []  - 0 Dermatologic / Skin Assessment (not related to wound area) ASSESSMENTS - Focused Assessment []  - 0 Circumferential Edema Measurements - multi extremities []  - 0 Nutritional Assessment / Counseling / Intervention []  - 0 Lower Extremity Assessment (monofilament, tuning fork, pulses) []  - 0 Peripheral Arterial Disease Assessment (using hand held doppler) ASSESSMENTS - Ostomy and/or Continence Assessment and Care []  - 0 Incontinence Assessment and Management []  - 0 Ostomy Care Assessment and Management (repouching, etc.) PROCESS - Coordination of Care []  - 0 Simple Patient / Family Education for ongoing care X- 1 20 Complex (extensive) Patient / Family Education for ongoing care X- 1 10 Staff obtains Chiropractor, Records, T Results / Process Orders est []  - 0 Staff telephones HHA, Nursing Homes / Clarify orders / etc []  - 0 Routine Transfer to another Facility (non-emergent condition) []  - 0 Routine Hospital Admission (non-emergent condition) []  - 0 New Admissions / Manufacturing engineer / Ordering NPWT Apligraf, etc. , []  - 0 Emergency Hospital Admission (emergent condition) []  - 0 Simple Discharge Coordination X- 1 15 Complex (extensive) Discharge Coordination PROCESS - Special Needs []  - 0 Pediatric / Minor Patient Management []  - 0 Isolation Patient Management []  - 0 Hearing / Language / Visual special needs []  - 0 Assessment of Community assistance (transportation, D/C planning, etc.) []  - 0 Additional assistance / Altered mentation []   - 0 Support Surface(s) Assessment (bed, cushion, seat, etc.) INTERVENTIONS - Wound Cleansing / Measurement []  - 0 Simple Wound Cleansing - one wound X- 3 5 Complex Wound Cleansing -  multiple wounds X- 1 5 Wound Imaging (photographs - any number of wounds) []  - 0 Wound Tracing (instead of photographs) []  - 0 Simple Wound Measurement - one wound X- 3 5 Complex Wound Measurement - multiple wounds INTERVENTIONS - Wound Dressings X - Small Wound Dressing one or multiple wounds 3 10 []  - 0 Medium Wound Dressing one or multiple wounds []  - 0 Large Wound Dressing one or multiple wounds X- 1 5 Application of Medications - topical []  - 0 Application of Medications - injection INTERVENTIONS - Miscellaneous []  - 0 External ear exam []  - 0 Specimen Collection (cultures, biopsies, blood, body fluids, etc.) [name] ([id number]) [id number] Page 3 of 11 []  - 0 Specimen(s) / Culture(s) sent or taken to Lab for analysis []  - 0 Patient Transfer (multiple staff / Nurse, adult / Similar devices) []  - 0 Simple Staple / Suture removal (25 or less) []  - 0 Complex Staple / Suture removal (26 or more) []  - 0 Hypo / Hyperglycemic Management (close monitor of Blood Glucose) []  - 0 Ankle / Brachial Index (ABI) - do not check if billed separately X- 1 5 Vital Signs Has the patient been seen at the hospital within the last three years: Yes Total Score: 150 Level Of Care: New/Established - Level 4 Electronic Signature(s) Signed: 08/21/2023 11:59:16 AM By: Charles Aver MSN RN CNS WTA Entered By: [name] on 08/21/2023 07:25:23 -------------------------------------------------------------------------------- Encounter Discharge Information Details Patient Name: Date of Service: [name], HO [place] RD D. 08/20/2023 1:15 PM Medical Record Number: [id number] Patient Account Number: [id number] Date of Birth/Sex: Treating RN: 1941-04-26 (82 y.o. [name] Primary  Care Ashton Sabine: [name] Other Clinician: Referring Zaniah Titterington: Treating Shulamit Donofrio/Extender: [name] in Treatment: 8 Encounter Discharge Information Items Discharge Condition: Stable Ambulatory Status: Wheelchair Discharge Destination: Home Transportation: Private Auto Accompanied By: daughter in law Schedule Follow-up Appointment: Yes Clinical Summary of Care: Electronic Signature(s) Signed: 08/21/2023 10:26:35 AM By: Charles Aver MSN RN CNS WTA Entered By: [name] on 08/21/2023 07:26:35 -------------------------------------------------------------------------------- Lower Extremity Assessment Details Patient Name: Date of Service: [name], Charles [place] RD D. 08/20/2023 1:15 PM Medical Record Number: [id number] Patient Account Number: [id number] Date of Birth/Sex: Treating RN: 1941-07-25 (82 y.o. Damarian, Arey, [place]a [place] D ([id number]) [id number] Page 4 of 11 Primary Care Jaci Desanto: [name] Other Clinician: Referring Charletha Dalpe: Treating Porfirio Bollier/Extender: [name] in Treatment: 8 Electronic Signature(s) Signed: 08/21/2023 10:22:56 AM By: Charles Aver MSN RN CNS WTA Entered By: [name] on 08/21/2023 07:22:37 -------------------------------------------------------------------------------- Multi Wound Chart Details Patient Name: Date of Service: [name], Charles [place] RD D. 08/20/2023 1:15 PM Medical Record Number: [id number] Patient Account Number: [id number] Date of Birth/Sex: Treating RN: 1941/03/21 (82 y.o. [name] Primary Care Ida Uppal: [name] Other Clinician: Referring Amaury Kuzel: Treating Masa Lubin/Extender: [name] in Treatment: 8 Vital Signs Height(in): 68 Pulse(bpm): 79 Weight(lbs): 167 Blood Pressure(mmHg): 154/58 Body Mass Index(BMI): 25.4 Temperature(F): 98.0 Respiratory Rate(breaths/min): 18 [1:Photos:] Right Amputation Site - Toe  Right, Plantar Foot Right, Lateral Foot Wound Location: Surgical Injury Footwear Injury Footwear Injury Wounding Event: Dehisced Wound Diabetic Wound/Ulcer of the Lower Diabetic Wound/Ulcer of the Lower Primary Etiology: Extremity Extremity Congestive Heart Failure, Congestive Heart Failure, Congestive Heart Failure, Comorbid History: Hypertension, Peripheral Venous Hypertension, Peripheral Venous Hypertension, Peripheral Venous Disease, Type II Diabetes, Disease, Type II Diabetes, Disease, Type II Diabetes, Neuropathy Neuropathy Neuropathy 04/16/2023 06/16/2023 06/16/2023 Date Acquired: 8 8 8  Weeks of Treatment: Open Open  Open Wound Status: No No No Wound Recurrence: 1x0.5x1.3 0.5x0.5x0.2 2.2x1.3x0.3 Measurements L x W x D (cm) 0.393 0.196 2.246 A (cm) : rea 0.511 0.039 0.674 Volume (cm) : 66.60% 49.10% -43.00% % Reduction in Area: 66.60% -2.60% -329.30% % Reduction in Volume: Full Thickness Without Exposed Grade 1 Grade 2 Classification: Support Structures Medium Medium Medium Exudate Amount: Serosanguineous Serosanguineous Serosanguineous Exudate Type: red, brown red, brown red, brown Exudate Color: Medium (34-66%) Medium (34-66%) Medium (34-66%) Granulation Amount: Pink Red, Pink Pink, Pale Granulation Quality: N/A None Present (0%) None Present (0%) Necrotic Amount: Fat Layer (Subcutaneous Tissue): Yes Fascia: No Fascia: No Exposed Structures: Fascia: No Fat Layer (Subcutaneous Tissue): No Fat Layer (Subcutaneous Tissue): No Tendon: No Tendon: No Tendon: No Muscle: No Muscle: No Muscle: No BOYDE, HOVATER ([id number]) [id number] Page 5 of 11 Joint: No Joint: No Joint: No Bone: No Bone: No Bone: No None Small (1-33%) None Epithelialization: Treatment Notes Electronic Signature(s) Signed: 08/21/2023 10:23:08 AM By: Charles Aver MSN RN CNS WTA Entered By: [name] on 08/21/2023  07:23:08 -------------------------------------------------------------------------------- Multi-Disciplinary Care Plan Details Patient Name: Date of Service: [name], HO [place] RD D. 08/20/2023 1:15 PM Medical Record Number: [id number] Patient Account Number: [id number] Date of Birth/Sex: Treating RN: 05-Nov-1940 (82 y.o. [name] Primary Care Maryelizabeth Eberle: [name] Other Clinician: Referring Kallen Delatorre: Treating Franklin Baumbach/Extender: [name] in Treatment: 8 Active Inactive Wound/Skin Impairment Nursing Diagnoses: Impaired tissue integrity Knowledge deficit related to ulceration/compromised skin integrity Goals: Patient/caregiver will verbalize understanding of skin care regimen Date Initiated: 06/20/2023 Date Inactivated: 07/18/2023 Target Resolution Date: 07/21/2023 Goal Status: Met Ulcer/skin breakdown will have a volume reduction of 30% by week 4 Date Initiated: 06/20/2023 Date Inactivated: 07/18/2023 Target Resolution Date: 07/21/2023 Goal Status: Met Ulcer/skin breakdown will have a volume reduction of 50% by week 8 Date Initiated: 06/20/2023 Date Inactivated: 08/08/2023 Target Resolution Date: 08/20/2023 Goal Status: Met Ulcer/skin breakdown will have a volume reduction of 80% by week 12 Date Initiated: 06/20/2023 Target Resolution Date: 09/20/2023 Goal Status: Active Ulcer/skin breakdown will heal within 14 weeks Date Initiated: 06/20/2023 Target Resolution Date: 10/04/2023 Goal Status: Active Interventions: Assess patient/caregiver ability to obtain necessary supplies Assess patient/caregiver ability to perform ulcer/skin care regimen upon admission and as needed Assess ulceration(s) every visit Provide education on ulcer and skin care Treatment Activities: Referred to DME Avaiah Stempel for dressing supplies : 06/20/2023 Skin care regimen initiated : 06/20/2023 Topical wound management initiated : 06/20/2023 Notes: Electronic Signature(s) DONTERRIO, MURDOUGH D ([id number]) [id number] Page 6 of 11 Signed: 08/21/2023 10:25:40 AM By: Charles Aver MSN RN CNS WTA Entered By: [name] on 08/21/2023 07:25:40 -------------------------------------------------------------------------------- Pain Assessment Details Patient Name: Date of Service: [name], Charles [place] RD D. 08/20/2023 1:15 PM Medical Record Number: [id number] Patient Account Number: [id number] Date of Birth/Sex: Treating RN: 08-07-1941 (82 y.o. [name] Primary Care Kassey Laforest: [name] Other Clinician: Referring Adrianah Prophete: Treating Saranne Crislip/Extender: [name] in Treatment: 8 Active Problems Location of Pain Severity and Description of Pain Patient Has Paino No Site Locations Pain Management and Medication Current Pain Management: Electronic Signature(s) Signed: 08/21/2023 10:19:10 AM By: Charles Aver MSN RN CNS WTA Entered By: [name] on 08/21/2023 07:19:09 -------------------------------------------------------------------------------- Patient/Caregiver Education Details Patient Name: Date of Service: [name], [name] RD D. 12/3/2024andnbsp1:15 PM Medical Record Number: [id number] Patient Account Number: [id number] KAYDE, HOGGATT ([id number]) [id number] Page 7 of 11 Date of Birth/Gender: Treating RN: 11/22/1940 (82 y.o. [name] Primary Care Physician: Harlon Flor,  Barbara Cower Other Clinician: Referring Physician: Treating Physician/Extender: [name] in Treatment: 8 Education Assessment Education Provided To: Patient Education Topics Provided Wound/Skin Impairment: Handouts: Caring for Your Ulcer Methods: Explain/Verbal Responses: State content correctly Electronic Signature(s) Signed: 08/21/2023 11:59:16 AM By: Charles Aver MSN RN CNS WTA Entered By: [name] on 08/21/2023  07:25:52 -------------------------------------------------------------------------------- Wound Assessment Details Patient Name: Date of Service: [name], HO [place] RD D. 08/20/2023 1:15 PM Medical Record Number: [id number] Patient Account Number: [id number] Date of Birth/Sex: Treating RN: 1941/06/24 (82 y.o. [name] Primary Care Naomia Lenderman: [name] Other Clinician: Referring Jakai Onofre: Treating Keiandra Sullenger/Extender: [name] in Treatment: 8 Wound Status Wound Number: 1 Primary Dehisced Wound Etiology: Wound Location: Right Amputation Site - Toe Wound Open Wounding Event: Surgical Injury Status: Date Acquired: 04/16/2023 Comorbid Congestive Heart Failure, Hypertension, Peripheral Venous Weeks Of Treatment: 8 History: Disease, Type II Diabetes, Neuropathy Clustered Wound: No Photos Wound Measurements Length: (cm) 1 Width: (cm) 0.5 Depth: (cm) 1.3 Area: (cm) 0.393 Volume: (cm) 0.511 Darrah, Amauris D ([id number]) Wound Description Classification: Full Thickness Without Exposed Support Structures Exudate Amount: Medium Exudate Type: Serosanguineous Exudate Color: red, brown Foul Odor After Cleansing: No Slough/Fibrino No % Reduction in Area: 66.6% % Reduction in Volume: 66.6% Epithelialization: None [id number] Page 8 of 11 Wound Bed Granulation Amount: Medium (34-66%) Exposed Structure Granulation Quality: Pink Fascia Exposed: No Fat Layer (Subcutaneous Tissue) Exposed: Yes Tendon Exposed: No Muscle Exposed: No Joint Exposed: No Bone Exposed: No Treatment Notes Wound #1 (Amputation Site - Toe) Wound Laterality: Right Cleanser Byram Ancillary Kit - 15 Day Supply Discharge Instruction: Use supplies as instructed; Kit contains: (15) Saline Bullets; (15) 3x3 Gauze; 15 pr Gloves Dakin 16 (oz) 0.25 Discharge Instruction: Use as directed. Peri-Wound Care Topical Primary Dressing Gauze Discharge Instruction: As  directed: dry, moistened with saline or moistened with Dakins Solution Secondary Dressing Conforming Guaze Roll-Large Discharge Instruction: Apply Conforming Stretch Guaze Bandage as directed Secured With Medipore T - 58M Medipore H Soft Cloth Surgical T ape ape, 2x2 (in/yd) Compression Wrap Compression Stockings Add-Ons Electronic Signature(s) Signed: 08/21/2023 10:21:28 AM By: Charles Aver MSN RN CNS WTA Previous Signature: 08/21/2023 10:20:53 AM Version By: Charles Aver MSN RN CNS WTA Entered By: [name] on 08/21/2023 07:21:28 -------------------------------------------------------------------------------- Wound Assessment Details Patient Name: Date of Service: [name], HO [place] RD D. 08/20/2023 1:15 PM Medical Record Number: [id number] Patient Account Number: [id number] Date of Birth/Sex: Treating RN: 1941-03-13 (82 y.o. [name] Primary Care Aristide Waggle: [name] Other Clinician: Referring Pascal Stiggers: Treating Kaprice Kage/Extender: [name] in Treatment: 8 Wound Status Wound Number: 2 Primary Diabetic Wound/Ulcer of the Lower Extremity ARDEAN, ELKO D ([id number]) [id number] Page 9 of 11 Etiology: Wound Location: Right, Plantar Foot Wound Open Wounding Event: Footwear Injury Status: Date Acquired: 06/16/2023 Comorbid Congestive Heart Failure, Hypertension, Peripheral Venous Weeks Of Treatment: 8 History: Disease, Type II Diabetes, Neuropathy Clustered Wound: No Photos Wound Measurements Length: (cm) 0.5 Width: (cm) 0.5 Depth: (cm) 0.2 Area: (cm) 0.196 Volume: (cm) 0.039 % Reduction in Area: 49.1% % Reduction in Volume: -2.6% Epithelialization: Small (1-33%) Wound Description Classification: Grade 1 Exudate Amount: Medium Exudate Type: Serosanguineous Exudate Color: red, brown Foul Odor After Cleansing: No Slough/Fibrino No Wound Bed Granulation Amount: Medium (34-66%) Exposed Structure Granulation  Quality: Red, Pink Fascia Exposed: No Necrotic Amount: None Present (0%) Fat Layer (Subcutaneous Tissue) Exposed: No Tendon Exposed: No Muscle Exposed: No Joint Exposed: No Bone Exposed: No Treatment Notes Wound #2 (Foot)  Wound Laterality: Plantar, Right Cleanser Soap and Water Discharge Instruction: Gently cleanse wound with antibacterial soap, rinse and pat dry prior to dressing wounds Peri-Wound Care Topical Santyl Collagenase Ointment, 30 (gm), tube Discharge Instruction: apply nickel thick to wound bed only Primary Dressing Gauze Discharge Instruction: As directed: dry, moistened with saline or moistened with Dakins Solution Secondary Dressing ABD Pad 5x9 (in/in) Discharge Instruction: Cover with ABD pad Secured With Medipore T - 26M Medipore H Soft Cloth Surgical T ape ape, 2x2 (in/yd) Kerlix Roll Sterile or Non-Sterile 6-ply 4.5x4 (yd/yd) Discharge Instruction: Apply Kerlix as directed Compression Wrap Compression Stockings Add-Ons PASQUAL, BANDY ([id number]) [id number] Page 10 of 11 Electronic Signature(s) Signed: 08/21/2023 10:22:01 AM By: Charles Aver MSN RN CNS WTA Entered By: [name] on 08/21/2023 07:22:00 -------------------------------------------------------------------------------- Wound Assessment Details Patient Name: Date of Service: [name], Charles [place] RD D. 08/20/2023 1:15 PM Medical Record Number: [id number] Patient Account Number: [id number] Date of Birth/Sex: Treating RN: 11-25-1940 (82 y.o. [name] Primary Care Emmani Lesueur: [name] Other Clinician: Referring Porsha Skilton: Treating Paeton Latouche/Extender: [name] in Treatment: 8 Wound Status Wound Number: 3 Primary Diabetic Wound/Ulcer of the Lower Extremity Etiology: Wound Location: Right, Lateral Foot Wound Open Wounding Event: Footwear Injury Status: Date Acquired: 06/16/2023 Comorbid Congestive Heart Failure, Hypertension,  Peripheral Venous Weeks Of Treatment: 8 History: Disease, Type II Diabetes, Neuropathy Clustered Wound: No Photos Wound Measurements Length: (cm) 2.2 Width: (cm) 1.3 Depth: (cm) 0.3 Area: (cm) 2.246 Volume: (cm) 0.674 % Reduction in Area: -43% % Reduction in Volume: -329.3% Epithelialization: None Wound Description Classification: Grade 2 Exudate Amount: Medium Exudate Type: Serosanguineous Exudate Color: red, brown Foul Odor After Cleansing: No Slough/Fibrino No Wound Bed Granulation Amount: Medium (34-66%) Exposed Structure Granulation Quality: Pink, Pale Fascia Exposed: No Necrotic Amount: None Present (0%) Fat Layer (Subcutaneous Tissue) Exposed: No Tendon Exposed: No Muscle Exposed: No Joint Exposed: No Bone Exposed: No Treatment Notes ASRIEL, OKON ([id number]) [id number] Page 11 of 11 Wound #3 (Foot) Wound Laterality: Right, Lateral Cleanser Soap and Water Discharge Instruction: Gently cleanse wound with antibacterial soap, rinse and pat dry prior to dressing wounds Peri-Wound Care Topical Santyl Collagenase Ointment, 30 (gm), tube Discharge Instruction: apply nickel thick to wound bed only Primary Dressing Gauze Discharge Instruction: As directed: dry, moistened with saline or moistened with Dakins Solution Secondary Dressing ABD Pad 5x9 (in/in) Discharge Instruction: Cover with ABD pad Secured With Medipore T - 26M Medipore H Soft Cloth Surgical T ape ape, 2x2 (in/yd) Kerlix Roll Sterile or Non-Sterile 6-ply 4.5x4 (yd/yd) Discharge Instruction: Apply Kerlix as directed Compression Wrap Compression Stockings Add-Ons Electronic Signature(s) Signed: 08/21/2023 10:22:24 AM By: Charles Aver MSN RN CNS WTA Entered By: [name] on 08/21/2023 07:22:24 -------------------------------------------------------------------------------- Vitals Details Patient Name: Date of Service: [name], HO [place] RD D. 08/20/2023 1:15 PM Medical  Record Number: [id number] Patient Account Number: [id number] Date of Birth/Sex: Treating RN: January 02, 1941 (82 y.o. [name] Primary Care Markey Deady: [name] Other Clinician: Referring Michale Emmerich: Treating Tryone Kille/Extender: [name] in Treatment: 8 Vital Signs Time Taken: 13:30 Temperature (F): 98.0 Height (in): 68 Pulse (bpm): 79 Weight (lbs): 167 Respiratory Rate (breaths/min): 18 Body Mass Index (BMI): 25.4 Blood Pressure (mmHg): 154/58 Reference Range: 80 - 120 mg / dl Electronic Signature(s) Signed: 08/21/2023 10:19:03 AM By: Charles Aver MSN RN CNS WTA Entered By: [name] on 08/21/2023 07:19:03

## 2023-08-22 NOTE — Progress Notes (Signed)
BREYEN, DEVITT ([id number]) [id number] Page 1 of 8 Visit Report for 08/20/2023 Chief Complaint Document Details Patient Name: Date of Service: [name], [name] 08/20/2023 1:15 PM Medical Record Number: [id number] Patient Account Number: [id number] Date of Birth/Sex: Treating RN: Apr 07, 1941 (82 y.o. [name] Primary Care Provider: Debbra [name] Other Clinician: Referring Provider: Treating Provider/Extender: [name] in Treatment: 8 Information Obtained from: Patient Chief Complaint Right foot ulcers Electronic Signature(s) Signed: 08/22/2023 7:39:18 AM By: Charles Derry PA-C Entered By: [name] on 08/22/2023 04:31:50 -------------------------------------------------------------------------------- HPI Details Patient Name: Date of Service: [name], [name] RD [name] 08/20/2023 1:15 PM Medical Record Number: [id number] Patient Account Number: [id number] Date of Birth/Sex: Treating RN: 05-11-1941 (82 y.o. [name] Primary Care Provider: Debbra [name] Other Clinician: Referring Provider: Treating Provider/Extender: [name] in Treatment: 8 History of Present Illness Chronic/Inactive Conditions Condition 1: Notes from Vascular 05/21/23: Right below-the-knee popliteal artery to peroneal artery bypass with reverse saphenous vein graft. This was done on 04/11/2023. Noninvasive studies in our an ABI 0.61 on the right and 0.67 on the left. T oday arterial duplexes were done which shows biphasic/triphasic waveforms throughout the lower extremities down to the level of the tibial vessels. 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 This is suggestive that the patient's bypass is patent although it is not there is no noted occlusion on duplex 1. Atherosclerosis of native arteries of the  extremities with ulceration (HCC) Based on noninvasive studies today appears that the patient would have possible marginal ability for wound healing. Concerned that some of the patient is usually a small vessel disease which may proved to have a little bit more difficult. Will have the patient return in several weeks to see if there has been improvement. They are advised that if the wound deteriorates or begins to show signs of starting to contact us for sooner follow-up. 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 With that being said that area is somewhat deep to be perfectly honest. It does have a little bit concerned. The remaining areas all came from the boot he was wearing and areas that rubbed they have gotten rid of the boot this will not happen again as far as that is concerned. With that being said the patient has had vascular intervention and the note is above as documented in the vascular note. With that being said it does appear that the patient has what is termed "possible marginal ability for wound healing". I think this is basically something this can be quite difficult for [place] to completely close. I am not seeing them possible but again there is a long road ahead. Patient does have a history of diabetes mellitus type 2, hypertension, peripheral vascular disease, coronary artery disease, long-term use of anticoagulants, [name], [name] [name] ([id number]) [id number] Page 2 of 8 chronic kidney disease stage III, and as of right now there is no direct evidence of continued and ongoing osteomyelitis although I do question whether or not there may be an issue here. 06-27-2023 upon evaluation today patient appears to be doing okay in regard to his wounds. He is showing signs of some  improvement which is good news although this appears to  be a little bit too moist in regard to the wounds on his feet in general. I do believe that he would benefit from a switching dressings to silver alginate away from the Xeroform at this point unfortunately. With that being said the original wound for which she was referred actually does appear to be doing okay at this point in my opinion. I am actually somewhat pleased with where we stand in that regard. I do not see any evidence of active infection at this time which is good. I think the Dakin's moistened gauze is helping. 07-04-2023 upon evaluation today patient appears to be doing well currently in regard to his wounds all things considered. I do believe that he is making headway towards some closure and this is good news. Fortunately I do not see any evidence of active infection looking or systemically which is great news as well. No fevers, chills, nausea, vomiting, or diarrhea. 07-11-2023 upon evaluation today patient appears to be doing well currently in regard to his wound. He has primary wound at the amputation site is better and the other wounds are all showing signs of improvement as well the worst being the right lateral foot but even that seems better with the Betadine that we switch to last week. 07-18-2023 upon evaluation today patient appears to be doing well currently in regard to his wound. He has been tolerating the dressing changes without complication. Fortunately I do not see any signs of active infection at this time. 07-25-2023 upon evaluation today patient appears to be doing well currently in regard to his wound. He has been taught routine the dressing changes and all 3 wounds look better especially the amputation site. Overall I am extremely pleased with where we stand currently. 08-01-2023 upon evaluation today patient appears to be doing well currently in regard to his wound. Has been tolerating the dressing changes  without complication Iodoflex seems to be cleaning up the side wounds although this is very slow the medial looks better than lateral. With the big guard to the primary surgical site this looks much better. 08-08-2023 upon evaluation today patient appears to be doing pretty well currently in regard to the surgical wound although his other 2 wounds are not doing nearly as well. I think we may need to make a switch here what we are doing I think of switching her over to [facility]. 08-20-2023 upon evaluation patient's wound bed actually showed signs of good granulation epithelization at this point. Fortunately I do not see any signs of infection at this time which is great news and in general I do feel like there were making really good headway here towards closure. All of his wounds appear to be doing better I think the Melburn Popper is doing a good job. Electronic Signature(s) Signed: 08/22/2023 7:39:18 AM By: Charles Derry PA-C Entered By: [name] on 08/22/2023 04:32:35 -------------------------------------------------------------------------------- Physical Exam Details Patient Name: Date of Service: [name], [name] 08/20/2023 1:15 PM Medical Record Number: [id number] Patient Account Number: [id number] Date of Birth/Sex: Treating RN: 08/09/1941 (82 y.o. [name] Primary Care Provider: Debbra [name] Other Clinician: Referring Provider: Treating Provider/Extender: [name] in Treatment: 8 Constitutional Well-nourished and well-hydrated in no acute distress. Respiratory normal breathing without difficulty. Psychiatric this patient is able to make decisions and demonstrates good insight into disease process. Alert and Oriented x 3. pleasant and cooperative. Notes Patient's wound bed actually showed signs of good granulation and epithelization at this point. I  actually feel like that his wounds were he does have some slough and biofilm is actually showing signs  of significant improvement and in general I think there were making really good headway here towards closure. Electronic Signature(s) Signed: 08/22/2023 7:39:18 AM By: Charles Derry PA-C Entered By: [name] on 08/22/2023 04:32:57 [name], [name] ([id number]) [id number] Page 3 of 8 -------------------------------------------------------------------------------- Physician Orders Details Patient Name: Date of Service: [name], [name] 08/20/2023 1:15 PM Medical Record Number: [id number] Patient Account Number: [id number] Date of Birth/Sex: Treating RN: [birth year] (82 y.o. [name] Primary Care Provider: Debbra [name] Other Clinician: Referring Provider: Treating Provider/Extender: [name] in Treatment: 8 The following information was scribed by: [name] The information was scribed for: [name] Verbal / Phone Orders: No Diagnosis Coding Follow-up Appointments Return Appointment in 1 week. Nurse Visit as needed [employer] wounds with antibacterial soap and water. May shower; gently cleanse wound with antibacterial soap, rinse and pat dry prior to dressing wounds Anesthetic (Use 'Patient Medications' Section for Anesthetic Order Entry) Lidocaine applied to wound bed Edema Control - Orders / Instructions Elevate, Exercise Daily and A void Standing for Long Periods of Time. Elevate legs to the level of the heart and pump ankles as often as possible Elevate leg(s) parallel to the floor when sitting. Wound Treatment Wound #1 - Amputation Site - Toe Wound Laterality: Right Cleanser: Byram Ancillary Kit - 15 Day Supply (Generic) 1 x Per Day/30 Days Discharge Instructions: Use supplies as instructed; Kit contains: (15) Saline Bullets; (15) 3x3 Gauze; 15 pr Gloves Cleanser: Dakin 16 (oz) 0.25 1 x Per Day/30 Days Discharge Instructions: Use as directed. Prim Dressing: Gauze (Generic) 1 x Per Day/30  Days ary Discharge Instructions: As directed: dry, moistened with saline or moistened with Dakins Solution Secondary Dressing: Conforming Guaze Roll-Large (Generic) 1 x Per Day/30 Days Discharge Instructions: Apply Conforming Stretch Guaze Bandage as directed Secured With: Medipore T - 49M Medipore H Soft Cloth Surgical T ape ape, 2x2 (in/yd) (Generic) 1 x Per Day/30 Days Wound #2 - Foot Wound Laterality: Plantar, Right Cleanser: Soap and Water 3 x Per Week/30 Days Discharge Instructions: Gently cleanse wound with antibacterial soap, rinse and pat dry prior to dressing wounds Topical: Santyl Collagenase Ointment, 30 (gm), tube 3 x Per Week/30 Days Discharge Instructions: apply nickel thick to wound bed only Prim Dressing: Gauze 3 x Per Week/30 Days ary Discharge Instructions: As directed: dry, moistened with saline or moistened with Dakins Solution Secondary Dressing: ABD Pad 5x9 (in/in) (Generic) 3 x Per Week/30 Days Discharge Instructions: Cover with ABD pad Secured With: Medipore T - 49M Medipore H Soft Cloth Surgical T ape ape, 2x2 (in/yd) (Generic) 3 x Per Week/30 Days Secured With: [employer] Sterile or Non-Sterile 6-ply 4.5x4 (yd/yd) (Generic) 3 x Per Week/30 Days Discharge Instructions: Apply Kerlix as directed [name], [name] ([id number]) [id number] Page 4 of 8 Wound #3 - Foot Wound Laterality: Right, Lateral Cleanser: Soap and Water 3 x Per Week/30 Days Discharge Instructions: Gently cleanse wound with antibacterial soap, rinse and pat dry prior to dressing wounds Topical: Santyl Collagenase Ointment, 30 (gm), tube 3 x Per Week/30 Days Discharge Instructions: apply nickel thick to wound bed only Prim Dressing: Gauze 3 x Per Week/30 Days ary Discharge Instructions: As directed: dry, moistened with saline or moistened with Dakins Solution Secondary Dressing: ABD Pad 5x9 (in/in) (Generic) 3 x Per Week/30 Days Discharge Instructions: Cover with ABD  pad Secured With:  Medipore T - 67M Medipore H Soft Cloth Surgical T ape ape, 2x2 (in/yd) (Generic) 3 x Per Week/30 Days Secured With: [employer] Sterile or Non-Sterile 6-ply 4.5x4 (yd/yd) (Generic) 3 x Per Week/30 Days Discharge Instructions: Apply Kerlix as directed Electronic Signature(s) Signed: 08/21/2023 10:24:24 AM By: Charles Aver MSN RN CNS WTA Signed: 08/22/2023 7:39:18 AM By: Charles Derry PA-C Entered By: [name] on 08/21/2023 07:24:23 -------------------------------------------------------------------------------- Problem List Details Patient Name: Date of Service: [name], [place] RD [name] 08/20/2023 1:15 PM Medical Record Number: [id number] Patient Account Number: [id number] Date of Birth/Sex: Treating RN: Jan 29, 1941 (82 y.o. [name] Primary Care Provider: Debbra [name] Other Clinician: Referring Provider: Treating Provider/Extender: [name] in Treatment: 8 Active Problems ICD-10 Encounter Code Description Active Date MDM Diagnosis T81.31XA Disruption of external operation (surgical) wound, not elsewhere classified, 06/20/2023 No Yes initial encounter E11.621 Type 2 diabetes mellitus with foot ulcer 06/20/2023 No Yes L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 06/20/2023 No Yes I10 Essential (primary) hypertension 06/20/2023 No Yes 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 06/20/2023 No Yes I25.10 Atherosclerotic heart disease of native coronary artery without angina pectoris 06/20/2023 No Yes [name], [name] ([id number]) [id number] Page 5 of 8 Z79.01 Long term (current) use of anticoagulants 06/20/2023 No Yes 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, stage 3 unspecified 06/20/2023 No Yes Inactive Problems Resolved Problems Electronic Signature(s) Signed: 08/22/2023 7:39:18 AM By: Charles Derry PA-C Entered By: [name] on 08/22/2023  04:31:41 -------------------------------------------------------------------------------- Progress Note Details Patient Name: Date of Service: [name], [name] RD [name] 08/20/2023 1:15 PM Medical Record Number: [id number] Patient Account Number: [id number] Date of Birth/Sex: Treating RN: [birth year] (82 y.o. [name] Primary Care Provider: Debbra [name] Other Clinician: Referring Provider: Treating Provider/Extender: [name] in Treatment: 8 Subjective Chief Complaint Information obtained from Patient Right foot ulcers History of Present Illness (HPI) Chronic/Inactive Condition: Notes from Vascular 05/21/23: Right below-the-knee popliteal artery to peroneal artery bypass with reverse saphenous vein graft. This was done on 04/11/2023. Noninvasive studies in our an ABI 0.61 on the right and 0.67 on the left. T oday arterial duplexes were done which shows biphasic/triphasic waveforms throughout the lower extremities down to the level of the tibial vessels. 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 This is suggestive that the patient's bypass is patent although it is not there is no noted occlusion on duplex 1. Atherosclerosis of native arteries of the extremities with ulceration (HCC) Based on noninvasive studies today appears that the patient would have possible marginal ability for wound healing. Concerned that some of the patient is usually a small vessel disease which may proved to have a little bit more difficult. Will have the patient return in several weeks to see if there has been improvement. They are advised that if the wound deteriorates or begins to show signs of starting to contact us for sooner follow-up. Upon evaluation today patient presents today for wounds over multiple locations which include several areas on the foot the original wound  is actually at surgery site which is the amputation of the second toe. With that being said that area is somewhat deep to be perfectly honest. It does have a little bit concerned. The remaining areas all came from the boot he was wearing and areas that rubbed they have gotten rid of the boot this will not  happen again as far as that is concerned. With that being said the patient has had vascular intervention and the note is above as documented in the vascular note. With that being said it does appear that the patient has what is termed "possible marginal ability for wound healing". I think this is basically something this can be quite difficult for [place] to completely close. I am not seeing them possible but again there is a long road ahead. Patient does have a history of diabetes mellitus type 2, hypertension, peripheral vascular disease, coronary artery disease, long-term use of anticoagulants, chronic kidney disease stage III, and as of right now there is no direct evidence of continued and ongoing osteomyelitis although I do question whether or not there may be an issue here. 06-27-2023 upon evaluation today patient appears to be doing okay in regard to his wounds. He is showing signs of some improvement which is good news although this appears to be a little bit too moist in regard to the wounds on his feet in general. I do believe that he would benefit from a switching dressings to silver alginate away from the Xeroform at this point unfortunately. With that being said the original wound for which she was referred actually does appear to be doing okay at this point in my opinion. I am actually somewhat pleased with where we stand in that regard. I do not see any evidence of active infection at this time which is good. I think the Dakin's moistened gauze is helping. 07-04-2023 upon evaluation today patient appears to be doing well currently in regard to his wounds all things considered. I do believe  that he is making headway towards some closure and this is good news. Fortunately I do not see any evidence of active infection looking or systemically which is great news as well. No fevers, chills, nausea, vomiting, or diarrhea. [name], [name] ([id number]) [id number] Page 6 of 8 07-11-2023 upon evaluation today patient appears to be doing well currently in regard to his wound. He has primary wound at the amputation site is better and the other wounds are all showing signs of improvement as well the worst being the right lateral foot but even that seems better with the Betadine that we switch to last week. 07-18-2023 upon evaluation today patient appears to be doing well currently in regard to his wound. He has been tolerating the dressing changes without complication. Fortunately I do not see any signs of active infection at this time. 07-25-2023 upon evaluation today patient appears to be doing well currently in regard to his wound. He has been taught routine the dressing changes and all 3 wounds look better especially the amputation site. Overall I am extremely pleased with where we stand currently. 08-01-2023 upon evaluation today patient appears to be doing well currently in regard to his wound. Has been tolerating the dressing changes without complication Iodoflex seems to be cleaning up the side wounds although this is very slow the medial looks better than lateral. With the big guard to the primary surgical site this looks much better. 08-08-2023 upon evaluation today patient appears to be doing pretty well currently in regard to the surgical wound although his other 2 wounds are not doing nearly as well. I think we may need to make a switch here what we are doing I think of switching her over to [facility]. 08-20-2023 upon evaluation patient's wound bed actually showed signs of good granulation epithelization at this point. Fortunately I do  not see any signs  of infection at this time which is great news and in general I do feel like there were making really good headway here towards closure. All of his wounds appear to be doing better I think the Melburn Popper is doing a good job. Objective Constitutional Well-nourished and well-hydrated in no acute distress. Vitals Time Taken: 1:30 PM, Height: 68 in, Weight: 167 lbs, BMI: 25.4, Temperature: 98.0 F, Pulse: 79 bpm, Respiratory Rate: 18 breaths/min, Blood Pressure: 154/58 mmHg. Respiratory normal breathing without difficulty. Psychiatric this patient is able to make decisions and demonstrates good insight into disease process. Alert and Oriented x 3. pleasant and cooperative. General Notes: Patient's wound bed actually showed signs of good granulation and epithelization at this point. I actually feel like that his wounds were he does have some slough and biofilm is actually showing signs of significant improvement and in general I think there were making really good headway here towards closure. Integumentary (Hair, Skin) Wound #1 status is Open. Original cause of wound was Surgical Injury. The date acquired was: 04/16/2023. The wound has been in treatment 8 weeks. The wound is located on the Right Amputation Site - T The wound measures 1cm length x 0.5cm width x 1.3cm depth; 0.393cm^2 area and 0.511cm^3 volume. oe. There is Fat Layer (Subcutaneous Tissue) exposed. There is a medium amount of serosanguineous drainage noted. There is medium (34-66%) pink granulation within the wound bed. Wound #2 status is Open. Original cause of wound was Footwear Injury. The date acquired was: 06/16/2023. The wound has been in treatment 8 weeks. The wound is located on the Right,Plantar Foot. The wound measures 0.5cm length x 0.5cm width x 0.2cm depth; 0.196cm^2 area and 0.039cm^3 volume. There is a medium amount of serosanguineous drainage noted. There is medium (34-66%) red, pink granulation within the wound bed. There is  no necrotic tissue within the wound bed. Wound #3 status is Open. Original cause of wound was Footwear Injury. The date acquired was: 06/16/2023. The wound has been in treatment 8 weeks. The wound is located on the Right,Lateral Foot. The wound measures 2.2cm length x 1.3cm width x 0.3cm depth; 2.246cm^2 area and 0.674cm^3 volume. There is a medium amount of serosanguineous drainage noted. There is medium (34-66%) pink, pale granulation within the wound bed. There is no necrotic tissue within the wound bed. Assessment Active Problems ICD-10 Disruption of external operation (surgical) wound, not elsewhere classified, initial encounter Type 2 diabetes mellitus with foot ulcer Non-pressure chronic ulcer of other part of right foot with fat layer exposed Essential (primary) hypertension Other specified peripheral vascular diseases Atherosclerotic heart disease of native coronary artery without angina pectoris Long term (current) use of anticoagulants Chronic kidney disease, stage 3 unspecified Plan [name], [name] ([id number]) [id number] Page 7 of 8 Follow-up Appointments: Return Appointment in 1 week. Nurse Visit as needed Bathing/ Shower/ Hygiene: Wash wounds with antibacterial soap and water. May shower; gently cleanse wound with antibacterial soap, rinse and pat dry prior to dressing wounds Anesthetic (Use 'Patient Medications' Section for Anesthetic Order Entry): Lidocaine applied to wound bed Edema Control - Orders / Instructions: Elevate, Exercise Daily and Avoid Standing for Long Periods of Time. Elevate legs to the level of the heart and pump ankles as often as possible Elevate leg(s) parallel to the floor when sitting. WOUND #1: - Amputation Site - T oe Wound Laterality: Right Cleanser: Byram Ancillary Kit - 15 Day Supply (Generic) 1 x Per Day/30 Days Discharge Instructions: Use supplies as instructed;  Kit contains: (15) Saline Bullets; (15) 3x3  Gauze; 15 pr Gloves Cleanser: Dakin 16 (oz) 0.25 1 x Per Day/30 Days Discharge Instructions: Use as directed. Prim Dressing: Gauze (Generic) 1 x Per Day/30 Days ary Discharge Instructions: As directed: dry, moistened with saline or moistened with Dakins Solution Secondary Dressing: Conforming Guaze Roll-Large (Generic) 1 x Per Day/30 Days Discharge Instructions: Apply Conforming Stretch Guaze Bandage as directed Secured With: Medipore T - 30M Medipore H Soft Cloth Surgical T ape ape, 2x2 (in/yd) (Generic) 1 x Per Day/30 Days WOUND #2: - Foot Wound Laterality: Plantar, Right Cleanser: Soap and Water 3 x Per Week/30 Days Discharge Instructions: Gently cleanse wound with antibacterial soap, rinse and pat dry prior to dressing wounds Topical: Santyl Collagenase Ointment, 30 (gm), tube 3 x Per Week/30 Days Discharge Instructions: apply nickel thick to wound bed only Prim Dressing: Gauze 3 x Per Week/30 Days ary Discharge Instructions: As directed: dry, moistened with saline or moistened with Dakins Solution Secondary Dressing: ABD Pad 5x9 (in/in) (Generic) 3 x Per Week/30 Days Discharge Instructions: Cover with ABD pad Secured With: Medipore T - 30M Medipore H Soft Cloth Surgical T ape ape, 2x2 (in/yd) (Generic) 3 x Per Week/30 Days Secured With: [employer] Sterile or Non-Sterile 6-ply 4.5x4 (yd/yd) (Generic) 3 x Per Week/30 Days Discharge Instructions: Apply Kerlix as directed WOUND #3: - Foot Wound Laterality: Right, Lateral Cleanser: Soap and Water 3 x Per Week/30 Days Discharge Instructions: Gently cleanse wound with antibacterial soap, rinse and pat dry prior to dressing wounds Topical: Santyl Collagenase Ointment, 30 (gm), tube 3 x Per Week/30 Days Discharge Instructions: apply nickel thick to wound bed only Prim Dressing: Gauze 3 x Per Week/30 Days ary Discharge Instructions: As directed: dry, moistened with saline or moistened with Dakins Solution Secondary Dressing: ABD Pad 5x9  (in/in) (Generic) 3 x Per Week/30 Days Discharge Instructions: Cover with ABD pad Secured With: Medipore T - 30M Medipore H Soft Cloth Surgical T ape ape, 2x2 (in/yd) (Generic) 3 x Per Week/30 Days Secured With: [employer] Sterile or Non-Sterile 6-ply 4.5x4 (yd/yd) (Generic) 3 x Per Week/30 Days Discharge Instructions: Apply Kerlix as directed 1. I would recommend that we continue with the Santyl over the medial and lateral wounds on the foot both seem to be making good progress here. 2. I am also can recommend that the patient should continue to use the Dakin's moistened gauze packing to the surgical site which seems to be doing well. 3. I am going to recommend that he should continue with appropriate offloading in regard to his feet and seems to be doing quite well. We will see patient back for reevaluation in 1 week here in the clinic. If anything worsens or changes patient will contact our office for additional recommendations. Electronic Signature(s) Signed: 08/22/2023 7:39:18 AM By: Charles Derry PA-C Entered By: [name] on 08/22/2023 04:33:15 -------------------------------------------------------------------------------- SuperBill Details Patient Name: Date of Service: [name], HO [place] [address] [name] 08/20/2023 Medical Record Number: [id number] Patient Account Number: [id number] Date of Birth/Sex: Treating RN: 1941-01-13 (82 y.o. 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 Z79.01 Long term (current) use of anticoagulants 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, stage 3 unspecified Facility Procedures : CPT4 Code: [id number] Description: 99214 - WOUND CARE VISIT-LEV 4 EST PT Modifier: Quantity: 1 Physician Procedures : CPT4 Code Description Modifier [id number] - WC PHYS LEVEL 3 - EST PT ICD-10 Diagnosis Description T81.31XA Disruption of external operation (surgical) wound, not elsewhere classified, initial encounter E11.621 Type 2 diabetes mellitus with foot  ulcer L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 I10 Essential (primary) hypertension Quantity: 1 Electronic Signature(s) Signed: 08/22/2023 7:39:18 AM By: Charles Derry PA-C Previous Signature: 08/21/2023 10:25:33 AM Version By: Charles Aver MSN RN CNS WTA Entered By: [name] on 08/22/2023 04:33:37

## 2023-09-03 ENCOUNTER — Ambulatory Visit: Payer: Medicare Other | Admitting: Podiatry

## 2023-09-03 ENCOUNTER — Encounter: Payer: Medicare Other | Admitting: Physician Assistant

## 2023-09-03 ENCOUNTER — Encounter: Payer: Self-pay | Admitting: Podiatry

## 2023-09-03 DIAGNOSIS — M79674 Pain in right toe(s): Secondary | ICD-10-CM | POA: Diagnosis not present

## 2023-09-03 DIAGNOSIS — B351 Tinea unguium: Secondary | ICD-10-CM | POA: Diagnosis not present

## 2023-09-03 DIAGNOSIS — I96 Gangrene, not elsewhere classified: Secondary | ICD-10-CM | POA: Diagnosis not present

## 2023-09-03 DIAGNOSIS — T8131XA Disruption of external operation (surgical) wound, not elsewhere classified, initial encounter: Secondary | ICD-10-CM | POA: Diagnosis not present

## 2023-09-03 NOTE — Progress Notes (Addendum)
CORDERRO, NISSLEY ([id number]) [id number] Page 1 of 12 Visit Report for 09/03/2023 Chief Complaint Document Details Patient Name: Date of Service: [name], [name] 09/03/2023 2:15 PM Medical Record Number: [id number] Patient Account Number: [id number] Date of Birth/Sex: Treating RN: May 19, 1941 (82 y.o. [name] Primary Care Provider: Debbra Riding Other Clinician: Referring Provider: Treating Provider/Extender: Jasmine Pang in Treatment: 10 Information Obtained from: Patient Chief Complaint Right foot ulcers Electronic Signature(s) Signed: 09/03/2023 2:49:52 PM By: Allen Derry PA-C Entered By: Allen Derry on 09/03/2023 11:49:51 -------------------------------------------------------------------------------- Debridement Details Patient Name: Date of Service: [name], [place] RD D. 09/03/2023 2:15 PM Medical Record Number: [id number] Patient Account Number: [id number] Date of Birth/Sex: Treating RN: [birth year] (82 y.o. 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 Performed By: Physician Allen Derry, PA-C The following information was scribed by: Midge Aver The information was scribed for: Allen Derry Debridement Type: Debridement Level of Consciousness (Pre-procedure): Awake and Alert Pre-procedure Verification/Time Out Yes - 15:24 Taken: Start Time: 15:24 Pain Control: Lidocaine 4% T opical Solution Percent of Wound Bed Debrided: 100% T Area Debrided (cm): otal 0.31 Tissue and other material debrided: Viable, Non-Viable, Slough, Subcutaneous, Slough Level: Skin/Subcutaneous Tissue Debridement Description: Excisional Instrument: Curette Bleeding: Minimum Hemostasis Achieved: Pressure MICHEAL, YUSKA D ([id number])  [phone].pdf Page 2 of 12 Procedural Pain: 0 Post Procedural Pain: 0 Response to Treatment: Procedure was tolerated well Level of Consciousness (Post- Awake and Alert procedure): Post Debridement Measurements of Total Wound Length: (cm) 1 Width: (cm) 0.4 Depth: (cm) 1.1 Volume: (cm) 0.346 Character of Wound/Ulcer Post Debridement: Stable Post Procedure Diagnosis Same as Pre-procedure Electronic Signature(s) Signed: 09/03/2023 4:17:14 PM By: Midge Aver MSN RN CNS WTA Signed: 09/06/2023 12:16:54 PM By: Allen Derry PA-C Entered By: Midge Aver on 09/03/2023 12:25:27 -------------------------------------------------------------------------------- Debridement Details Patient Name: Date of Service: [name], [place] RD D. 09/03/2023 2:15 PM Medical Record Number: [id number] Patient Account Number: [id number] Date of Birth/Sex: Treating RN: 1940/10/22 (82 y.o. [name] Primary Care Provider: Debbra Riding Other Clinician: Referring Provider: Treating Provider/Extender: Jasmine Pang in Treatment: 10 Debridement Performed for Assessment: Wound #2 Right,Plantar Foot Performed By: Physician Allen Derry, PA-C Debridement Type: Debridement Severity of Tissue Pre Debridement: Fat layer exposed Level of Consciousness (Pre-procedure): Awake and Alert Pre-procedure Verification/Time Out Yes - 15:24 Taken: Start Time: 15:24 Pain Control: Lidocaine 4% T opical Solution Percent of Wound Bed Debrided: 100% T Area Debrided (cm): otal 0.16 Tissue and other material debrided: Viable, Non-Viable, Callus, Slough, Subcutaneous, Slough Level: Skin/Subcutaneous Tissue Debridement Description: Excisional Instrument: Curette Bleeding: Minimum Hemostasis Achieved: Pressure Procedural Pain: 0 Post Procedural Pain: 0 Response to Treatment: Procedure was tolerated well Level of Consciousness (Post- Awake and Alert procedure): Post Debridement  Measurements of Total Wound Length: (cm) 0.5 Width: (cm) 0.4 Depth: (cm) 0.1 Volume: (cm) 0.016 Character of Wound/Ulcer Post Debridement: Stable Severity of Tissue Post Debridement: Fat layer exposed Post Procedure Diagnosis MYSEAN, NAPPO D ([id number]) [id number] Page 3 of 12 Same as Pre-procedure Electronic Signature(s) Signed: 09/03/2023 4:17:14 PM By: Midge Aver MSN RN CNS WTA Signed: 09/06/2023 12:16:54 PM By: Allen Derry PA-C Entered By: Midge Aver on 09/03/2023 12:26:22 -------------------------------------------------------------------------------- Debridement Details Patient Name: Date of Service: [name], [place] RD D. 09/03/2023 2:15 PM Medical Record Number: [id number] Patient Account Number: [id number] Date of Birth/Sex:  Treating RN: Mar 30, 1941 (82 y.o. [name] Primary Care Provider: Debbra Riding Other Clinician: Referring Provider: Treating Provider/Extender: Jasmine Pang in Treatment: 10 Debridement Performed for Assessment: Wound #3 Right,Lateral Foot Performed By: Physician Allen Derry, PA-C Debridement Type: Debridement Severity of Tissue Pre Debridement: Fat layer exposed Level of Consciousness (Pre-procedure): Awake and Alert Pre-procedure Verification/Time Out Yes - 15:24 Taken: Start Time: 15:24 Pain Control: Lidocaine 4% T opical Solution Percent of Wound Bed Debrided: 100% T Area Debrided (cm): otal 2.55 Tissue and other material debrided: Viable, Non-Viable, Muscle, Slough, Subcutaneous, Slough Level: Skin/Subcutaneous Tissue/Muscle Debridement Description: Excisional Instrument: Curette Bleeding: Minimum Hemostasis Achieved: Pressure Procedural Pain: 0 Post Procedural Pain: 0 Response to Treatment: Procedure was tolerated well Level of Consciousness (Post- Awake and Alert procedure): Post Debridement Measurements of Total Wound Length: (cm) 2.5 Width: (cm) 1.3 Depth: (cm)  0.5 Volume: (cm) 1.276 Character of Wound/Ulcer Post Debridement: Stable Severity of Tissue Post Debridement: Fat layer exposed Post Procedure Diagnosis Same as Pre-procedure Electronic Signature(s) Signed: 09/03/2023 4:17:14 PM By: Midge Aver MSN RN CNS WTA Signed: 09/06/2023 12:16:54 PM By: Allen Derry PA-C Entered By: Midge Aver on 09/03/2023 12:29:07 Peter Congo D ([id number]) [id number] Page 4 of 12 -------------------------------------------------------------------------------- HPI Details Patient Name: Date of Service: [name], [name] 09/03/2023 2:15 PM Medical Record Number: [id number] Patient Account Number: [id number] Date of Birth/Sex: Treating RN: Aug 15, 1941 (82 y.o. [name] Primary Care Provider: Debbra Riding Other Clinician: Referring Provider: Treating Provider/Extender: Jasmine Pang in Treatment: 10 History of Present Illness Chronic/Inactive Conditions Condition 1: Notes from Vascular 05/21/23: Right below-the-knee popliteal artery to peroneal artery bypass with reverse saphenous vein graft. This was done on 04/11/2023. Noninvasive studies in our an ABI 0.61 on the right and 0.67 on the left. T oday arterial duplexes were done which shows biphasic/triphasic waveforms throughout the lower extremities down to the level of the tibial vessels. 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 This is suggestive that the patient's bypass is patent although it is not there is no noted occlusion on duplex 1. Atherosclerosis of native arteries of the extremities with ulceration (HCC) Based on noninvasive studies today appears that the patient would have possible marginal ability for wound healing. Concerned that some of the patient is usually a small vessel disease which may proved to have a little bit  more difficult. Will have the patient return in several weeks to see if there has been improvement. They are advised that if the wound deteriorates or begins to show signs of starting to contact us for sooner follow-up. 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 With that being said that area is somewhat deep to be perfectly honest. It does have a little bit concerned. The remaining areas all came from the boot he was wearing and areas that rubbed they have gotten rid of the boot this will not happen again as far as that is concerned. With that being said the patient has had vascular intervention and the note is above as documented in the vascular note. With that being said it does appear that the patient has what is termed "possible marginal ability for wound healing". I think this is basically something this can be  quite difficult for [place] to completely close. I am not seeing them possible but again there is a long road ahead. Patient does have a history of diabetes mellitus type 2, hypertension, peripheral vascular disease, coronary artery disease, long-term use of anticoagulants, chronic kidney disease stage III, and as of right now there is no direct evidence of continued and ongoing osteomyelitis although I do question whether or not there may be an issue here. 06-27-2023 upon evaluation today patient appears to be doing okay in regard to his wounds. He is showing signs of some improvement which is good news although this appears to be a little bit too moist in regard to the wounds on his feet in general. I do believe that he would benefit from a switching dressings to silver alginate away from the Xeroform at this point unfortunately. With that being said the original wound for which she was referred actually does  appear to be doing okay at this point in my opinion. I am actually somewhat pleased with where we stand in that regard. I do not see any evidence of active infection at this time which is good. I think the Dakin's moistened gauze is helping. 07-04-2023 upon evaluation today patient appears to be doing well currently in regard to his wounds all things considered. I do believe that he is making headway towards some closure and this is good news. Fortunately I do not see any evidence of active infection looking or systemically which is great news as well. No fevers, chills, nausea, vomiting, or diarrhea. 07-11-2023 upon evaluation today patient appears to be doing well currently in regard to his wound. He has primary wound at the amputation site is better and the other wounds are all showing signs of improvement as well the worst being the right lateral foot but even that seems better with the Betadine that we switch to last week. 07-18-2023 upon evaluation today patient appears to be doing well currently in regard to his wound. He has been tolerating the dressing changes without complication. Fortunately I do not see any signs of active infection at this time. 07-25-2023 upon evaluation today patient appears to be doing well currently in regard to his wound. He has been taught routine the dressing changes and all 3 wounds look better especially the amputation site. Overall I am extremely pleased with where we stand currently. 08-01-2023 upon evaluation today patient appears to be doing well currently in regard to his wound. Has been tolerating the dressing changes without complication Iodoflex seems to be cleaning up the side wounds although this is very slow the medial looks better than lateral. With the big guard to the primary surgical site this looks much better. 08-08-2023 upon evaluation today patient appears to be doing pretty well currently in regard to the surgical wound although his other 2  wounds are not doing nearly as well. I think we may need to make a switch here what we are doing I think of switching her over to [facility] 08-20-2023 upon evaluation patient's wound bed actually showed signs of good granulation epithelization at this point. Fortunately I do not see any signs of infection at this time which is great news and in general I do feel like there were making really good headway here towards closure. All of his wounds appear to be doing better I think the Melburn Popper is doing a good job. 09-04-2023 upon evaluation today patient appears to be doing well currently in regard to his wounds for the most  part he is showing signs of improvement which is excellent. I do believe that the lateral foot wound is the worst out of all this and they all are gena require sharp debridement today. Electronic Signature(s) Signed: 09/04/2023 8:47:06 AM By: Allen Derry PA-C Entered By: Allen Derry on 09/04/2023 05:47:06 Peter Congo D ([id number]) [id number] Page 5 of 12 -------------------------------------------------------------------------------- Physical Exam Details Patient Name: Date of Service: [name], [name] 09/03/2023 2:15 PM Medical Record Number: [id number] Patient Account Number: [id number] Date of Birth/Sex: Treating RN: [birth year] (82 y.o. [name] Primary Care Provider: Debbra Riding Other Clinician: Referring Provider: Treating Provider/Extender: Jasmine Pang in Treatment: 10 Constitutional Well-nourished and well-hydrated in no acute distress. Respiratory normal breathing without difficulty. Psychiatric this patient is able to make decisions and demonstrates good insight into disease process. Alert and Oriented x 3. pleasant and cooperative. Notes Upon inspection I did perform debridement clearway necrotic debris the patient tolerated this today without complication and postdebridement the wound  bed appears to be doing significantly better which is great news. I do not see any signs of active infection at this time. Electronic Signature(s) Signed: 09/04/2023 8:47:20 AM By: Allen Derry PA-C Entered By: Allen Derry on 09/04/2023 05:47:19 -------------------------------------------------------------------------------- Physician Orders Details Patient Name: Date of Service: [name], [place] RD D. 09/03/2023 2:15 PM Medical Record Number: [id number] Patient Account Number: [id number] Date of Birth/Sex: Treating RN: 1940/12/25 (82 y.o. [name] Primary Care Provider: Debbra Riding Other Clinician: Referring Provider: Treating Provider/Extender: Jasmine Pang in Treatment: 10 The following information was scribed by: Midge Aver The information was scribed for: Allen Derry Verbal / Phone Orders: No Diagnosis Coding ICD-10 Coding Code Description T81.31XA Disruption of external operation (surgical) wound, not elsewhere classified, initial encounter E11.621 Type 2 diabetes mellitus with foot ulcer L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 I10 Essential (primary) hypertension 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 URIYAH, ETUE ([id number]) [phone].pdf Page 6 of 12 I25.10 Atherosclerotic heart disease of native coronary artery without angina pectoris Z79.01 Long term (current) use of anticoagulants 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 Nurse Visit as needed [employer] wounds with antibacterial soap and water. May shower; gently cleanse wound with antibacterial soap, rinse and pat dry prior to dressing wounds Anesthetic (Use 'Patient Medications' Section for Anesthetic Order Entry) Lidocaine applied to wound bed Edema Control - Orders / Instructions Tubigrip single layer applied. -  EE ABD and Kerlix for drainage on the leg Elevate, Exercise Daily and A void Standing for Long Periods of Time. Elevate legs to the level of the heart and pump ankles as often as possible Elevate leg(s) parallel to the floor when sitting. Wound Treatment Wound #1 - Amputation Site - Toe Wound Laterality: Right Cleanser: Byram Ancillary Kit - 15 Day Supply (Generic) 1 x Per Day/30 Days Discharge Instructions: Use supplies as instructed; Kit contains: (15) Saline Bullets; (15) 3x3 Gauze; 15 pr Gloves Prim Dressing: Gauze 1 x Per Day/30 Days ary Discharge Instructions: As directed: dry, moistened with saline or moistened with Dakins Solution Prim Dressing: Hydrofera Blue Ready Transfer Foam, 2.5x2.5 (in/in) 1 x Per Day/30 Days ary Discharge Instructions: Apply Hydrofera Blue Ready to wound bed as directed Secondary Dressing: Conforming Guaze Roll-Large (Generic) 1 x Per Day/30 Days Discharge Instructions: Apply Conforming Stretch Guaze Bandage as directed Secured With: Medipore T - 26M Medipore H  Soft Cloth Surgical T ape ape, 2x2 (in/yd) (Generic) 1 x Per Day/30 Days Wound #2 - Foot Wound Laterality: Plantar, Right Cleanser: Soap and Water 3 x Per Week/30 Days Discharge Instructions: Gently cleanse wound with antibacterial soap, rinse and pat dry prior to dressing wounds Topical: Santyl Collagenase Ointment, 30 (gm), tube 3 x Per Week/30 Days Discharge Instructions: apply nickel thick to wound bed only Prim Dressing: Gauze 3 x Per Week/30 Days ary Discharge Instructions: As directed: dry, moistened with saline or moistened with Dakins Solution Prim Dressing: Hydrofera Blue Ready Transfer Foam, 2.5x2.5 (in/in) 3 x Per Week/30 Days ary Discharge Instructions: Apply Hydrofera Blue Ready to wound bed as directed Secondary Dressing: ABD Pad 5x9 (in/in) (Generic) 3 x Per Week/30 Days Discharge Instructions: Cover with ABD pad Secured With: Medipore T - 71M Medipore H Soft Cloth Surgical T ape  ape, 2x2 (in/yd) (Generic) 3 x Per Week/30 Days Secured With: [employer] Sterile or Non-Sterile 6-ply 4.5x4 (yd/yd) (Generic) 3 x Per Week/30 Days Discharge Instructions: Apply Kerlix as directed Secured With: Tubigrip Size E, 3.5x10 (in/yds) 3 x Per Week/30 Days Discharge Instructions: Apply 3 Tubigrip E 3-finger-widths below knee to base of toes to secure dressing and/or for swelling. Wound #3 - Foot Wound Laterality: Right, Lateral Cleanser: Soap and Water 3 x Per Week/30 Days Discharge Instructions: Gently cleanse wound with antibacterial soap, rinse and pat dry prior to dressing wounds Topical: Santyl Collagenase Ointment, 30 (gm), tube 3 x Per Week/30 Days Discharge Instructions: apply nickel thick to wound bed only Prim Dressing: Gauze 3 x Per Week/30 Days ary Discharge Instructions: As directed: dry, moistened with saline or moistened with Dakins Solution Secondary Dressing: ABD Pad 5x9 (in/in) (Generic) 3 x Per Week/30 Days Discharge Instructions: Cover with ABD pad Secured With: Medipore T - 71M Medipore H Soft Cloth Surgical T ape ape, 2x2 (in/yd) (Generic) 3 x Per Week/30 Days Secured With: [employer] Sterile or Non-Sterile 6-ply 4.5x4 (yd/yd) (Generic) 3 x Per Week/30 Days JERRITT, OBERG D ([id number]) [phone].pdf Page 7 of 12 Discharge Instructions: Apply Kerlix as directed Secured With: Tubigrip Size E, 3.5x10 (in/yds) 3 x Per Week/30 Days Discharge Instructions: Apply 3 Tubigrip E 3-finger-widths below knee to base of toes to secure dressing and/or for swelling. Patient Medications llergies: HIB vaccine, Flu vaccine A Notifications Medication Indication Start End 09/03/2023 Bactrim DS DOSE 1 - oral 800 mg-160 mg tablet - 1 tablet oral twice a day x 14 days Electronic Signature(s) Signed: 09/03/2023 3:37:32 PM By: Allen Derry PA-C Entered By: Allen Derry on 09/03/2023  12:37:31 -------------------------------------------------------------------------------- Problem List Details Patient Name: Date of Service: [name], [place] RD D. 09/03/2023 2:15 PM Medical Record Number: [id number] Patient Account Number: [id number] Date of Birth/Sex: Treating RN: 14-May-1941 (82 y.o. [name] Primary Care Provider: Debbra Riding Other Clinician: Referring Provider: Treating Provider/Extender: Jasmine Pang in Treatment: 10 Active Problems ICD-10 Encounter Code Description Active Date MDM Diagnosis T81.31XA Disruption of external operation (surgical) wound, not elsewhere classified, 06/20/2023 No Yes initial encounter E11.621 Type 2 diabetes mellitus with foot ulcer 06/20/2023 No Yes L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 06/20/2023 No Yes L97.523 Non-pressure chronic ulcer of other part of left foot with necrosis of muscle 09/03/2023 No Yes I10 Essential (primary) hypertension 06/20/2023 No Yes 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 06/20/2023 No Yes I25.10 Atherosclerotic heart disease of native coronary artery without angina pectoris 06/20/2023 No Yes TRINIDAD, WICKER D ([id number])  [id number] Page 8 of 12 Z79.01 Long term (current) use of anticoagulants 06/20/2023 No Yes 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, stage 3 unspecified 06/20/2023 No Yes Inactive Problems Resolved Problems Electronic Signature(s) Signed: 09/04/2023 8:48:55 AM By: Allen Derry PA-C Previous Signature: 09/03/2023 2:49:47 PM Version By: Allen Derry PA-C Entered By: Allen Derry on 09/04/2023 05:48:55 -------------------------------------------------------------------------------- Progress Note Details Patient Name: Date of Service: [name], [place] RD D. 09/03/2023 2:15 PM Medical Record Number: [id number] Patient Account Number: [id number] Date of Birth/Sex: Treating RN: [birth year] (82  y.o. [name] Primary Care Provider: Debbra Riding Other Clinician: Referring Provider: Treating Provider/Extender: Jasmine Pang in Treatment: 10 Subjective Chief Complaint Information obtained from Patient Right foot ulcers History of Present Illness (HPI) Chronic/Inactive Condition: Notes from Vascular 05/21/23: Right below-the-knee popliteal artery to peroneal artery bypass with reverse saphenous vein graft. This was done on 04/11/2023. Noninvasive studies in our an ABI 0.61 on the right and 0.67 on the left. T oday arterial duplexes were done which shows biphasic/triphasic waveforms throughout the lower extremities down to the level of the tibial vessels. 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 This is suggestive that the patient's bypass is patent although it is not there is no noted occlusion on duplex 1. Atherosclerosis of native arteries of the extremities with ulceration (HCC) Based on noninvasive studies today appears that the patient would have possible marginal ability for wound healing. Concerned that some of the patient is usually a small vessel disease which may proved to have a little bit more difficult. Will have the patient return in several weeks to see if there has been improvement. They are advised that if the wound deteriorates or begins to show signs of starting to contact us for sooner follow-up. 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 With that being said that area is somewhat deep to be perfectly honest. It does have a little bit concerned. The remaining areas all came from the boot he was wearing and areas that rubbed they have gotten rid of the boot this  will not happen again as far as that is concerned. With that being said the patient has had vascular intervention and the note is above as documented in the vascular note. With that being said it does appear that the patient has what is termed "possible marginal ability for wound healing". I think this is basically something this can be quite difficult for [place] to completely close. I am not seeing them possible but again there is a long road ahead. Patient does have a history of diabetes mellitus type 2, hypertension, peripheral vascular disease, coronary artery disease, long-term use of anticoagulants, chronic kidney disease stage III, and as of right now there is no direct evidence of continued and ongoing osteomyelitis although I do question whether or not there may be an issue here. 06-27-2023 upon evaluation today patient appears to be doing okay in regard to his wounds. He is showing signs of some improvement which is good news although this appears to be a little bit too moist in regard to the wounds on his feet in general. I do believe that he would benefit from a switching dressings to silver alginate away from the Xeroform at this point unfortunately. With that  being said the original wound for which she was referred actually does appear to be doing okay at this point in my opinion. I am actually somewhat pleased with where we stand in that regard. I do not see any evidence of active infection at this time which is good. I think the Dakin's moistened gauze is helping. 07-04-2023 upon evaluation today patient appears to be doing well currently in regard to his wounds all things considered. I do believe that he is making headway towards some closure and this is good news. Fortunately I do not see any evidence of active infection looking or systemically which is great news as well. No fevers, chills, nausea, vomiting, or diarrhea. REIS, TABORA ([id number])  [id number] Page 9 of 12 07-11-2023 upon evaluation today patient appears to be doing well currently in regard to his wound. He has primary wound at the amputation site is better and the other wounds are all showing signs of improvement as well the worst being the right lateral foot but even that seems better with the Betadine that we switch to last week. 07-18-2023 upon evaluation today patient appears to be doing well currently in regard to his wound. He has been tolerating the dressing changes without complication. Fortunately I do not see any signs of active infection at this time. 07-25-2023 upon evaluation today patient appears to be doing well currently in regard to his wound. He has been taught routine the dressing changes and all 3 wounds look better especially the amputation site. Overall I am extremely pleased with where we stand currently. 08-01-2023 upon evaluation today patient appears to be doing well currently in regard to his wound. Has been tolerating the dressing changes without complication Iodoflex seems to be cleaning up the side wounds although this is very slow the medial looks better than lateral. With the big guard to the primary surgical site this looks much better. 08-08-2023 upon evaluation today patient appears to be doing pretty well currently in regard to the surgical wound although his other 2 wounds are not doing nearly as well. I think we may need to make a switch here what we are doing I think of switching her over to [facility]. 08-20-2023 upon evaluation patient's wound bed actually showed signs of good granulation epithelization at this point. Fortunately I do not see any signs of infection at this time which is great news and in general I do feel like there were making really good headway here towards closure. All of his wounds appear to be doing better I think the Melburn Popper is doing a good job. 09-04-2023 upon evaluation today patient  appears to be doing well currently in regard to his wounds for the most part he is showing signs of improvement which is excellent. I do believe that the lateral foot wound is the worst out of all this and they all are gena require sharp debridement today. Objective Constitutional Well-nourished and well-hydrated in no acute distress. Vitals Time Taken: 2:42 PM, Height: 68 in, Weight: 167 lbs, BMI: 25.4, Temperature: 98.2 F, Pulse: 78 bpm, Respiratory Rate: 18 breaths/min, Blood Pressure: 151/52 mmHg. Respiratory normal breathing without difficulty. Psychiatric this patient is able to make decisions and demonstrates good insight into disease process. Alert and Oriented x 3. pleasant and cooperative. General Notes: Upon inspection I did perform debridement clearway necrotic debris the patient tolerated this today without complication and postdebridement the wound bed appears to be doing significantly better which is great news. I do not see any  signs of active infection at this time. Integumentary (Hair, Skin) Wound #1 status is Open. Original cause of wound was Surgical Injury. The date acquired was: 04/16/2023. The wound has been in treatment 10 weeks. The wound is located on the Right Amputation Site - T The wound measures 1cm length x 0.4cm width x 1.1cm depth; 0.314cm^2 area and 0.346cm^3 volume. oe. There is Fat Layer (Subcutaneous Tissue) exposed. There is a medium amount of serosanguineous drainage noted. There is medium (34-66%) pink granulation within the wound bed. Wound #2 status is Open. Original cause of wound was Footwear Injury. The date acquired was: 06/16/2023. The wound has been in treatment 10 weeks. The wound is located on the Right,Plantar Foot. The wound measures 0.5cm length x 0.4cm width x 0.1cm depth; 0.157cm^2 area and 0.016cm^3 volume. There is a medium amount of serosanguineous drainage noted. There is medium (34-66%) red, pink granulation within the wound bed. There is  no necrotic tissue within the wound bed. Wound #3 status is Open. Original cause of wound was Footwear Injury. The date acquired was: 06/16/2023. The wound has been in treatment 10 weeks. The wound is located on the Right,Lateral Foot. The wound measures 2.5cm length x 1.3cm width x 0.5cm depth; 2.553cm^2 area and 1.276cm^3 volume. There is a medium amount of serosanguineous drainage noted. There is medium (34-66%) pink, pale granulation within the wound bed. There is no necrotic tissue within the wound bed. 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 There was a Excisional Skin/Subcutaneous Tissue oe Debridement with a total area of 0.31 sq cm performed by Allen Derry, PA-C. With the following instrument(s): Curette to remove Viable and Non-Viable tissue/material. Material removed includes Subcutaneous Tissue and Slough and after achieving pain control using Lidocaine 4% T opical Solution. No specimens were taken. A time out was conducted at 15:24, prior to the start of the procedure. A Minimum amount of bleeding was controlled with Pressure. The procedure was tolerated well with a pain level of 0 throughout and a pain level of 0 following the procedure. Post Debridement  Measurements: 1cm length x 0.4cm width x 1.1cm depth; 0.346cm^3 volume. Character of Wound/Ulcer Post Debridement is stable. Post procedure Diagnosis Wound #1: Same as Pre-Procedure Wound #2 Pre-procedure diagnosis of Wound #2 is a Diabetic Wound/Ulcer of the Lower Extremity located on the Right,Plantar Foot .Severity of Tissue Pre Debridement is: Fat layer exposed. There was a Excisional Skin/Subcutaneous Tissue Debridement with a total area of 0.16 sq cm performed by Allen Derry, PA-C. With the following instrument(s): Curette to remove Viable and Non-Viable tissue/material. Material removed includes Callus, Subcutaneous Tissue, and Slough after achieving pain control using Lidocaine 4% T opical Solution. No specimens were taken. A time out was conducted at 15:24, prior to the start of the procedure. A Minimum amount of bleeding was controlled with Pressure. The procedure was tolerated well with a pain level of 0 throughout and a  pain level of 0 following the procedure. Post Debridement Measurements: 0.5cm length x 0.4cm width x 0.1cm depth; 0.016cm^3 volume. Character of Wound/Ulcer Post Debridement is stable. Severity of Tissue Post Debridement is: Fat layer exposed. Post procedure Diagnosis Wound #2: Same as Pre-Procedure Wound #3 Pre-procedure diagnosis of Wound #3 is a Diabetic Wound/Ulcer of the Lower Extremity located on the Right,Lateral Foot .Severity of Tissue Pre Debridement is: Fat layer exposed. There was a Excisional Skin/Subcutaneous Tissue/Muscle Debridement with a total area of 2.55 sq cm performed by Allen Derry, PA-C. With the following instrument(s): Curette to remove Viable and Non-Viable tissue/material. Material removed includes Muscle, Subcutaneous Tissue, and Slough after achieving pain control using Lidocaine 4% Topical Solution. No specimens were taken. A time out was conducted at 15:24, prior to the start of the procedure. A Minimum amount of bleeding was controlled  with Pressure. The procedure was tolerated well with a pain level of 0 throughout and a pain level of 0 following the procedure. Post Debridement Measurements: 2.5cm length x 1.3cm width x 0.5cm depth; 1.276cm^3 volume. Character of Wound/Ulcer Post Debridement is stable. Severity of Tissue Post Debridement is: Fat layer exposed. Post procedure Diagnosis Wound #3: Same as Pre-Procedure Plan Follow-up Appointments: Return Appointment in 1 week. Nurse Visit as needed Bathing/ Shower/ Hygiene: Wash wounds with antibacterial soap and water. May shower; gently cleanse wound with antibacterial soap, rinse and pat dry prior to dressing wounds Anesthetic (Use 'Patient Medications' Section for Anesthetic Order Entry): Lidocaine applied to wound bed Edema Control - Orders / Instructions: Tubigrip single layer applied. - EE ABD and Kerlix for drainage on the leg Elevate, Exercise Daily and Avoid Standing for Long Periods of Time. Elevate legs to the level of the heart and pump ankles as often as possible Elevate leg(s) parallel to the floor when sitting. The following medication(s) was prescribed: Bactrim DS oral 800 mg-160 mg tablet 1 1 tablet oral twice a day x 14 days starting 09/03/2023 WOUND #1: - Amputation Site - T oe Wound Laterality: Right Cleanser: Byram Ancillary Kit - 15 Day Supply (Generic) 1 x Per Day/30 Days Discharge Instructions: Use supplies as instructed; Kit contains: (15) Saline Bullets; (15) 3x3 Gauze; 15 pr Gloves Prim Dressing: Gauze 1 x Per Day/30 Days ary Discharge Instructions: As directed: dry, moistened with saline or moistened with Dakins Solution Prim Dressing: Hydrofera Blue Ready Transfer Foam, 2.5x2.5 (in/in) 1 x Per Day/30 Days ary Discharge Instructions: Apply Hydrofera Blue Ready to wound bed as directed Secondary Dressing: Conforming Guaze Roll-Large (Generic) 1 x Per Day/30 Days Discharge Instructions: Apply Conforming Stretch Guaze Bandage as  directed Secured With: Medipore T - 42M Medipore H Soft Cloth Surgical T ape ape, 2x2 (in/yd) (Generic) 1 x Per Day/30 Days WOUND #2: - Foot Wound Laterality: Plantar, Right Cleanser: Soap and Water 3 x Per Week/30 Days Discharge Instructions: Gently cleanse wound with antibacterial soap, rinse and pat dry prior to dressing wounds Topical: Santyl Collagenase Ointment, 30 (gm), tube 3 x Per Week/30 Days Discharge Instructions: apply nickel thick to wound bed only Prim Dressing: Gauze 3 x Per Week/30 Days ary Discharge Instructions: As directed: dry, moistened with saline or moistened with Dakins Solution Prim Dressing: Hydrofera Blue Ready Transfer Foam, 2.5x2.5 (in/in) 3 x Per Week/30 Days ary Discharge Instructions: Apply Hydrofera Blue Ready to wound bed as directed Secondary Dressing: ABD Pad 5x9 (in/in) (Generic) 3 x Per Week/30 Days Discharge Instructions: Cover with ABD pad Secured With: Medipore T - 42M Medipore H Soft Cloth  Surgical T ape ape, 2x2 (in/yd) (Generic) 3 x Per Week/30 Days Secured With: [employer] Sterile or Non-Sterile 6-ply 4.5x4 (yd/yd) (Generic) 3 x Per Week/30 Days Discharge Instructions: Apply Kerlix as directed Secured With: Tubigrip Size E, 3.5x10 (in/yds) 3 x Per Week/30 Days Discharge Instructions: Apply 3 Tubigrip E 3-finger-widths below knee to base of toes to secure dressing and/or for swelling. WOUND #3: - Foot Wound Laterality: Right, Lateral Cleanser: Soap and Water 3 x Per Week/30 Days Discharge Instructions: Gently cleanse wound with antibacterial soap, rinse and pat dry prior to dressing wounds Topical: Santyl Collagenase Ointment, 30 (gm), tube 3 x Per Week/30 Days Discharge Instructions: apply nickel thick to wound bed only Prim Dressing: Gauze 3 x Per Week/30 Days ary Discharge Instructions: As directed: dry, moistened with saline or moistened with Dakins Solution Secondary Dressing: ABD Pad 5x9 (in/in) (Generic) 3 x Per Week/30 Days QUENTAVIOUS, ZOMBEK ([id number]) [phone].pdf Page 11 of 12 Discharge Instructions: Cover with ABD pad Secured With: Medipore T - 74M Medipore H Soft Cloth Surgical T ape ape, 2x2 (in/yd) (Generic) 3 x Per Week/30 Days Secured With: [employer] Sterile or Non-Sterile 6-ply 4.5x4 (yd/yd) (Generic) 3 x Per Week/30 Days Discharge Instructions: Apply Kerlix as directed Secured With: Tubigrip Size E, 3.5x10 (in/yds) 3 x Per Week/30 Days Discharge Instructions: Apply 3 Tubigrip E 3-finger-widths below knee to base of toes to secure dressing and/or for swelling. 1. I would recommend that the patient should continue to monitor for any signs of infection or worsening and if anything changes he will contact the office let me know. 2. I am going to suggest specifically that we utilize the [facility] for both the surgical site as well as the plantar/medial foot wound. 3. I would recommend for the lateral foot wound we continue with the Santyl which I think is beneficial at this point. We will see patient back for reevaluation in 1 week here in the clinic. If anything worsens or changes patient will contact our office for additional recommendations. Electronic Signature(s) Signed: 09/04/2023 8:49:09 AM By: Allen Derry PA-C Previous Signature: 09/04/2023 8:47:58 AM Version By: Allen Derry PA-C Entered By: Allen Derry on 09/04/2023 05:49:09 -------------------------------------------------------------------------------- SuperBill Details Patient Name: Date of Service: [name], HO [place] RD D. 09/03/2023 Medical Record Number: [id number] Patient Account Number: [id number] Date of Birth/Sex: Treating RN: Jul 07, 1941 (82 y.o. [name] Primary Care Provider: Debbra Riding Other Clinician: Referring Provider: Treating Provider/Extender: Jasmine Pang in Treatment: 10 Diagnosis Coding ICD-10 Codes Code Description T81.31XA Disruption of external operation  (surgical) wound, not elsewhere classified, initial encounter E11.621 Type 2 diabetes mellitus with foot ulcer L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 L97.523 Non-pressure chronic ulcer of other part of left foot with necrosis of muscle I10 Essential (primary) hypertension 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 Z79.01 Long term (current) use of anticoagulants 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, stage 3 unspecified Facility Procedures : CPT4 Code: 33295188 Description: 11042 - DEB SUBQ TISSUE 20 SQ CM/< ICD-10 Diagnosis Description L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 Modifier: Quantity: 1 : CPT4 Code: 41660630 Description: 11043 - DEB MUSC/FASCIA 20 SQ CM/< ICD-10 Diagnosis Description L97.523 Non-pressure chronic ulcer of other part of left foot with necrosis of muscle Modifier: Quantity: 1 Physician Procedures : CPT4 Code Description [address] ARNAZ, SPRAUER D ([id number]) [id number].pdf Page 1 [phone]  11042 - WC PHYS SUBQ TISS 20 SQ CM 1 ICD-10 Diagnosis Description L97.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 ulcer of other part of right foot with fat  layer exposed Quantity: 2 of 12 : [id number] 11043 - WC PHYS DEBR MUSCLE/FASCIA 20 SQ CM 1 ICD-10 Diagnosis Description L97.523 Non-pressure chronic ulcer of other part of left foot with necrosis of muscle Quantity: Electronic Signature(s) Signed: 09/04/2023 8:49:21 AM By: Allen Derry PA-C Entered By: Allen Derry on 09/04/2023 05:49:21

## 2023-09-03 NOTE — Progress Notes (Signed)
Chief Complaint  Patient presents with   Diabetes    "I guess he's going to look at my foot.  I think it's finally healing up.  I have a sore spot on the side of my foot."    Subjective:  Patient presents today status post right second toe amputation inpatient.  DOS: 04/16/2023.  Patient being seen at the wound care center weekly.  Presenting to have his nails debrided today to the right foot.  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   a.) TTE 08/27/2022: Ao sinus 4.1, asc Ao 3.7, ST junction 3.3   Atherosclerotic PVD with intermittent claudication (HCC)    a.) s/p vascular intervention 03/26/2018:  post tibial and peroneal crosser athrectomy, PTA LEFT post tibial, SFA, and popliteal arteries; b.) s/p PTA 03/20/2023: RIGHT SFA and popliteal arteries   Bilateral carotid artery disease (HCC) 08/22/2022   a.) carotid doppler 08/22/2022: <50 LICA, 50-69% RICA   Blurry vision, bilateral    CAD (coronary artery disease) 09/21/2022   a.) cCTA 09/21/2022: 591 (57 %ile for age/sex/race matched control)   Chest pain    Chronic heart failure with preserved ejection fraction (HFpEF) (HCC)    a.) TTE 08/27/2022: EF >55%, aym LVH, mild LAE, triv AR/PR, mild MR/TR. mild AS, Ao root dilatation, G1DD   CKD (chronic kidney disease), stage III (HCC)    Gangrene of toe of right foot (HCC)    a.) RIGHT second toe   Heart murmur    History of bilateral cataract extraction 2020   HLD (hyperlipidemia)    Hypertension    Incomplete right bundle branch block (RBBB)    Long term current use of antithrombotics/antiplatelets    a.) clopidogrel   Neuropathy    Palpitations    T2DM (type 2 diabetes mellitus) (HCC)    Vertigo    Wears dentures    Full upper and lower (loose)    Past Surgical History:  Procedure Laterality Date   AMPUTATION TOE Left 03/28/2018   Procedure:  AMPUTATION TOE/ PARTIAL RAY RESECTION-LEFT 2ND AND 3RD;  Surgeon: Linus Galas, DPM;  Location: [facility];  Service: Podiatry;  Laterality: Left;   AMPUTATION TOE Right 04/16/2023   Procedure: AMPUTATION TOE;  Surgeon: Felecia Shelling, DPM;  Location: [facility];  Service: Orthopedics/Podiatry;  Laterality: Right;   CATARACT EXTRACTION W/PHACO Left 02/10/2019   Procedure: CATARACT EXTRACTION PHACO AND INTRAOCULAR LENS PLACEMENT (IOC)  LEFT DIABETIC;  Surgeon: Nevada Crane, MD;  Location: [facility];  Service: Ophthalmology;  Laterality: Left;  Diabetic - oral meds   CATARACT EXTRACTION W/PHACO Right 04/20/2019   Procedure: CATARACT EXTRACTION PHACO AND INTRAOCULAR LENS PLACEMENT (IOC)  RIGHT DIABETIC;  Surgeon: Nevada Crane, MD;  Location: [facility];  Service: Ophthalmology;  Laterality: Right;   FEMORAL-TIBIAL BYPASS GRAFT Right 04/11/2023   Procedure: BYPASS GRAFT FEMORAL-TIBIAL ARTERY ( POPLITEAL TO PERONEAL);  Surgeon: Renford Dills, MD;  Location: [facility];  Service: Vascular;  Laterality: Right;   HERNIA REPAIR     LOWER EXTREMITY ANGIOGRAPHY Left 03/26/2018   Procedure: Lower Extremity Angiography;  Surgeon: Renford Dills, MD;  Location: [facility];  Service: Cardiovascular;  Laterality: Left;   LOWER EXTREMITY ANGIOGRAPHY Right 03/20/2023   Procedure: Lower Extremity Angiography;  Surgeon: Renford Dills, MD;  Location: [facility];  Service: Cardiovascular;  Laterality: Right;   LOWER EXTREMITY INTERVENTION Right 05/03/2023   Procedure: LOWER EXTREMITY INTERVENTION;  Surgeon: Renford Dills, MD;  Location: [facility];  Service: Cardiovascular;  Laterality: Right;  With Staple removal   Percutaneous transluminal angioplasty right superficial femoral artery      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   Procedure: TRANSMETATARSAL AMPUTATION;  Surgeon: Recardo Evangelist, DPM;  Location: [facility];  Service: Podiatry;   Laterality: Left;    Allergies  Allergen Reactions   Haemophilus B Polysaccharide Vaccine Nausea And Vomiting and Other (See Comments)    Fever, chills, vomiting.   Influenza Virus Vaccine Other (See Comments) and Nausea And Vomiting    Fever, chills, vomiting.      Objective/Physical Exam Drainage noted coming through the dressings from the underlying right leg ulcers.  Elongated dystrophic nails 1-5 right foot.  History of right second toe amputation.  Fibrotic wound base at the amputation site.  Ulcer also noted to the plantar lateral aspect of the fifth MTP of the right foot with heavy well adhered eschar.  There is moderate malodor coming from the dressings and wounds  ABI Findings:  +--------+------------------+-----+----------+--------+  Right  Rt Pressure (mmHg)IndexWaveform  Comment   +--------+------------------+-----+----------+--------+  [id number]                                       +--------+------------------+-----+----------+--------+  PTA    84                0.47 monophasic          +--------+------------------+-----+----------+--------+  PERO   109               0.61 monophasic          +--------+------------------+-----+----------+--------+   +---------+------------------+-----+-------------------+--------+  Left    Lt Pressure (mmHg)IndexWaveform           Comment   +---------+------------------+-----+-------------------+--------+  Brachial 178                                                 +---------+------------------+-----+-------------------+--------+  ATA     67                0.38 dampened monophasic          +---------+------------------+-----+-------------------+--------+  PTA     120               0.67 monophasic                   +---------+------------------+-----+-------------------+--------+  PERO    93                0.52 monophasic                    +---------+------------------+-----+-------------------+--------+  Great Toe                                          1/2 Foot  +---------+------------------+-----+-------------------+--------+   +-------+-----------+-----------+------------+------------+  ABI/TBIToday's ABIToday's TBIPrevious ABIPrevious TBI  +-------+-----------+-----------+------------+------------+  Right .61                   1.14                      +-------+-----------+-----------+------------+------------+  Left  .67                   .91                       +-------+-----------+-----------+------------+------------+  Bilateral ABIs appear decreased compared to prior study on 11/03/2018.    Summary:  Right: Resting right ankle-brachial index indicates moderate right lower extremity arterial disease.  Left: Resting left ankle-brachial index indicates moderate left lower  extremity arterial disease.   Radiographic Exam RT foot 05/07/2023:  Interval amputation of the right second toe at the level of the MTP.  Stable.  No erosions that would be concerning for osteomyelitis  Assessment: 1. s/p amputation right second toe inpatient. DOS: 04/16/2023 2.  S/p popliteal-peroneal artery bypass graft.  DOS: 04/11/2023.  Dr. Gilda Crease, [facility] 3. 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  Ulcers right foot and leg  Plan of Care:  -Patient was evaluated.  - Mechanical debridement of the nails was performed today for the patient using a nail nipper without incident or bleeding -Patient has an appointment today at the [facility] wound care center at 3:30 PM. -Patient has been referred back to vascular Dr. Gilda Crease from the wound care center.  High risk of likely more proximal amputation would be recommended. -Return to clinic with me as needed  Felecia Shelling, DPM [facility]  Dr. Felecia Shelling, DPM    [address] [place], [place] [address]                 Office [phone]  Fax [phone]     On but

## 2023-09-04 NOTE — Progress Notes (Signed)
OSIRUS, BARDIN [name] ([id number]) [id number] Page 1 of 11 Visit Report for 09/03/2023 Arrival Information Details Patient Name: Date of Service: [name], [name] 09/03/2023 2:15 PM Medical Record Number: [id number] Patient Account Number: [id number] Date of Birth/Sex: Treating RN: [birth year] (82 y.o. [name] Primary Care [name]: [name] Other Clinician: Referring [name]: Treating [name]/Extender: [name] in Treatment: 10 Visit Information History Since Last Visit Added or deleted any medications: No Patient Arrived: [name] Any new allergies or adverse reactions: No Arrival Time: 14:39 Had a fall or experienced change in No Accompanied By: daughter in law activities of daily living that may affect Transfer Assistance: None risk of falls: Patient Identification Verified: Yes Hospitalized since last visit: No Secondary Verification Process Completed: Yes Has Dressing in Place as Prescribed: Yes Patient Requires Transmission-Based Precautions: No Pain Present Now: No Patient Has Alerts: Yes Patient Alerts: Patient on Blood Thinner diabetes type 2 Plavix Electronic Signature(s) Signed: 09/03/2023 4:17:14 PM By: Midge Aver MSN RN CNS WTA Entered By: Midge Aver on 09/03/2023 11:40:18 -------------------------------------------------------------------------------- Clinic Level of Care Assessment Details Patient Name: Date of Service: [name], [name] 09/03/2023 2:15 PM Medical Record Number: [id number] Patient Account Number: [id number] Date of Birth/Sex: Treating RN: April 02, 1941 (82 y.o. [name] Primary Care [name]: [name] Other Clinician: Referring [name]: Treating [name]/Extender: [name] in Treatment: 10 Clinic Level of Care Assessment Items TOOL 1 Quantity Score []  - 0 Use when EandM and Procedure is performed on INITIAL visit ASSESSMENTS -  Nursing Assessment / Reassessment []  - 0 General Physical Exam (combine w/ comprehensive assessment (listed just below) when performed on new pt. evals) []  - 0 Comprehensive Assessment (HX, ROS, Risk Assessments, Wounds Hx, etc.) ASSESSMENTS - Wound and Skin Assessment / Reassessment []  - 0 Dermatologic / Skin Assessment (not related to wound area) Charles, [name] ([id number]) [id number] Page 2 of 11 ASSESSMENTS - Ostomy and/or Continence Assessment and Care []  - 0 Incontinence Assessment and Management []  - 0 Ostomy Care Assessment and Management (repouching, etc.) PROCESS - Coordination of Care []  - 0 Simple Patient / Family Education for ongoing care []  - 0 Complex (extensive) Patient / Family Education for ongoing care []  - 0 Staff obtains Chiropractor, Records, T Results / Process Orders est []  - 0 Staff telephones HHA, Nursing Homes / Clarify orders / etc []  - 0 Routine Transfer to another Facility (non-emergent condition) []  - 0 Routine Hospital Admission (non-emergent condition) []  - 0 New Admissions / Manufacturing engineer / Ordering NPWT Apligraf, etc. , []  - 0 Emergency Hospital Admission (emergent condition) PROCESS - Special Needs []  - 0 Pediatric / Minor Patient Management []  - 0 Isolation Patient Management []  - 0 Hearing / Language / Visual special needs []  - 0 Assessment of Community assistance (transportation, [name]/C planning, etc.) []  - 0 Additional assistance / Altered mentation []  - 0 Support Surface(s) Assessment (bed, cushion, seat, etc.) INTERVENTIONS - Miscellaneous []  - 0 External ear exam []  - 0 Patient Transfer (multiple staff / Nurse, adult / Similar devices) []  - 0 Simple Staple / Suture removal (25 or less) []  - 0 Complex Staple / Suture removal (26 or more) []  - 0 Hypo/Hyperglycemic Management (do not check if billed separately) []  - 0 Ankle / Brachial Index (ABI) - do not check if billed separately Has  the patient been seen at the hospital within the last three years: Yes Total Score: 0 Level Of Care: ____  Electronic Signature(s) Signed: 09/03/2023 4:17:14 PM By: Midge Aver MSN RN CNS WTA Entered By: Midge Aver on 09/03/2023 12:32:43 -------------------------------------------------------------------------------- Encounter Discharge Information Details Patient Name: Date of Service: [name], [place] RD [name] 09/03/2023 2:15 PM Medical Record Number: [id number] Patient Account Number: [id number] Date of Birth/Sex: Treating RN: 08-Apr-1941 (82 y.o. [name] Primary Care Deen Deguia: [name] Other Clinician: Referring [name]: Treating [name]/Extender: [name] in Treatment: 10 Encounter Discharge Information Items Post Procedure [name], GASCHLER [name] ([id number]) [id number] Page 3 of 11 Discharge Condition: Stable Temperature (F): 98.2 Ambulatory Status: Crutches Pulse (bpm): 78 Discharge Destination: Home Respiratory Rate (breaths/min): 18 Transportation: Private Auto Blood Pressure (mmHg): 151/52 Accompanied By: daughter in law Schedule Follow-up Appointment: Yes Clinical Summary of Care: Electronic Signature(s) Signed: 09/03/2023 4:17:14 PM By: Midge Aver MSN RN CNS WTA Entered By: Midge Aver on 09/03/2023 12:57:24 -------------------------------------------------------------------------------- Lower Extremity Assessment Details Patient Name: Date of Service: [name], [place] RD [name] 09/03/2023 2:15 PM Medical Record Number: [id number] Patient Account Number: [id number] Date of Birth/Sex: Treating RN: 11-01-1940 (82 y.o. [name] Primary Care Sencere Symonette: [name] Other Clinician: Referring Fed Ceci: Treating Trysten Bernard/Extender: [name] in Treatment: 10 Electronic Signature(s) Signed: 09/03/2023 4:17:14 PM By: Midge Aver MSN RN CNS WTA Entered By: Midge Aver on  09/03/2023 11:58:52 -------------------------------------------------------------------------------- Multi Wound Chart Details Patient Name: Date of Service: [name], [place] RD [name] 09/03/2023 2:15 PM Medical Record Number: [id number] Patient Account Number: [id number] Date of Birth/Sex: Treating RN: 29-Aug-1941 (82 y.o. [name] Primary Care Nayana Lenig: [name] Other Clinician: Referring Kameryn Davern: Treating Kristell Wooding/Extender: [name] in Treatment: 10 Vital Signs Height(in): 68 Pulse(bpm): 78 Weight(lbs): 167 Blood Pressure(mmHg): 151/52 Body Mass Index(BMI): 25.4 Temperature(F): 98.2 Respiratory Rate(breaths/min): 18 [1:Photos:] [3:[id number] Page 4 of 11] Right Amputation Site - Toe Right, Plantar Foot Right, Lateral Foot Wound Location: Surgical Injury Footwear Injury Footwear Injury Wounding Event: Dehisced Wound Diabetic Wound/Ulcer of the Lower Diabetic Wound/Ulcer of the Lower Primary Etiology: Extremity Extremity Congestive Heart Failure, Congestive Heart Failure, Congestive Heart Failure, Comorbid History: Hypertension, Peripheral Venous Hypertension, Peripheral Venous Hypertension, Peripheral Venous Disease, Type II Diabetes, Disease, Type II Diabetes, Disease, Type II Diabetes, Neuropathy Neuropathy Neuropathy 04/16/2023 06/16/2023 06/16/2023 Date Acquired: 10 10 10  Weeks of Treatment: Open Open Open Wound Status: No No No Wound Recurrence: 1x0.4x1.1 0.5x0.4x0.1 2.5x1.3x0.5 Measurements L x W x [name] (cm) 0.314 0.157 2.553 A (cm) : rea 0.346 0.016 1.276 Volume (cm) : 73.30% 59.20% -62.50% % Reduction in Area: 77.40% 57.90% -712.70% % Reduction in Volume: Full Thickness Without Exposed Grade 1 Grade 2 Classification: Support Structures Medium Medium Medium Exudate Amount: Serosanguineous Serosanguineous Serosanguineous Exudate Type: red, brown red, brown red, brown Exudate Color: Medium  (34-66%) Medium (34-66%) Medium (34-66%) Granulation Amount: Pink Red, Pink Pink, Pale Granulation Quality: N/A None Present (0%) None Present (0%) Necrotic Amount: Fat Layer (Subcutaneous Tissue): Yes Fascia: No Fascia: No Exposed Structures: Fascia: No Fat Layer (Subcutaneous Tissue): No Fat Layer (Subcutaneous Tissue): No Tendon: No Tendon: No Tendon: No Muscle: No Muscle: No Muscle: No Joint: No Joint: No Joint: No Bone: No Bone: No Bone: No None Small (1-33%) None Epithelialization: Treatment Notes Electronic Signature(s) Signed: 09/03/2023 4:17:14 PM By: Midge Aver MSN RN CNS WTA Entered By: Midge Aver on 09/03/2023 12:22:48 -------------------------------------------------------------------------------- Multi-Disciplinary Care Plan Details Patient Name: Date of Service: [name], [name] RD [name] 09/03/2023 2:15 PM Medical Record Number: [id number] Patient Account Number: [id number] Date of  Birth/Sex: Treating RN: September 29, 1940 (82 y.o. [name] Primary Care Jerzey Komperda: [name] Other Clinician: Referring Aiyden Lauderback: Treating Sheyenne Konz/Extender: [name] in Treatment: 10 Active Inactive Wound/Skin Impairment Nursing Diagnoses: Impaired tissue integrity Knowledge deficit related to ulceration/compromised skin integrity FLAMUR, SHEHEE [name] ([id number]) [id number] Page 5 of 11 Goals: Patient/caregiver will verbalize understanding of skin care regimen Date Initiated: 06/20/2023 Date Inactivated: 07/18/2023 Target Resolution Date: 07/21/2023 Goal Status: Met Ulcer/skin breakdown will have a volume reduction of 30% by week 4 Date Initiated: 06/20/2023 Date Inactivated: 07/18/2023 Target Resolution Date: 07/21/2023 Goal Status: Met Ulcer/skin breakdown will have a volume reduction of 50% by week 8 Date Initiated: 06/20/2023 Date Inactivated: 08/08/2023 Target Resolution Date: 08/20/2023 Goal Status:  Met Ulcer/skin breakdown will have a volume reduction of 80% by week 12 Date Initiated: 06/20/2023 Target Resolution Date: 09/20/2023 Goal Status: Active Ulcer/skin breakdown will heal within 14 weeks Date Initiated: 06/20/2023 Target Resolution Date: 10/04/2023 Goal Status: Active Interventions: Assess patient/caregiver ability to obtain necessary supplies Assess patient/caregiver ability to perform ulcer/skin care regimen upon admission and as needed Assess ulceration(s) every visit Provide education on ulcer and skin care Treatment Activities: Referred to DME Castella Lerner for dressing supplies : 06/20/2023 Skin care regimen initiated : 06/20/2023 Topical wound management initiated : 06/20/2023 Notes: Electronic Signature(s) Signed: 09/03/2023 4:17:14 PM By: Midge Aver MSN RN CNS WTA Entered By: Midge Aver on 09/03/2023 12:56:07 -------------------------------------------------------------------------------- Pain Assessment Details Patient Name: Date of Service: [name], [place] RD [name] 09/03/2023 2:15 PM Medical Record Number: [id number] Patient Account Number: [id number] Date of Birth/Sex: Treating RN: Feb 01, 1941 (82 y.o. [name] Primary Care Senovia Gauer: [name] Other Clinician: Referring Dalaney Needle: Treating Nalda Shackleford/Extender: [name] in Treatment: 10 Active Problems Location of Pain Severity and Description of Pain Patient Has Paino No Site Locations [place], [place] [name] ([id number]) [id number] Page 6 of 11 Pain Management and Medication Current Pain Management: Electronic Signature(s) Signed: 09/03/2023 4:17:14 PM By: Midge Aver MSN RN CNS WTA Entered By: Midge Aver on 09/03/2023 11:43:37 -------------------------------------------------------------------------------- Patient/Caregiver Education Details Patient Name: Date of Service: [name], [name] RD [name] 12/17/2024andnbsp2:15 PM Medical Record Number:  [id number] Patient Account Number: [id number] Date of Birth/Gender: Treating RN: [birth year] (82 y.o. [name] Primary Care Physician: [name] Other Clinician: Referring Physician: Treating Physician/Extender: [name] in Treatment: 10 Education Assessment Education Provided To: Patient Education Topics Provided Wound/Skin Impairment: Handouts: Caring for Your Ulcer Methods: Explain/Verbal Responses: State content correctly Electronic Signature(s) Signed: 09/03/2023 4:17:14 PM By: Midge Aver MSN RN CNS WTA Entered By: Midge Aver on 09/03/2023 12:56:20 ARMEEN, CAPULONG [name] ([id number]) [id number] Page 7 of 11 -------------------------------------------------------------------------------- Wound Assessment Details Patient Name: Date of Service: [name], [name] 09/03/2023 2:15 PM Medical Record Number: [id number] Patient Account Number: [id number] Date of Birth/Sex: Treating RN: [birth year] (82 y.o. [name] Primary Care Kellyn Mccary: [name] Other Clinician: Referring Bernadett Milian: Treating Jiali Linney/Extender: [name] in Treatment: 10 Wound Status Wound Number: 1 Primary Dehisced Wound Etiology: Wound Location: Right Amputation Site - Toe Wound Open Wounding Event: Surgical Injury Status: Date Acquired: 04/16/2023 Comorbid Congestive Heart Failure, Hypertension, Peripheral Venous Weeks Of Treatment: 10 History: Disease, Type II Diabetes, Neuropathy Clustered Wound: No Photos Wound Measurements Length: (cm) 1 Width: (cm) 0.4 Depth: (cm) 1.1 Area: (cm) 0.314 Volume: (cm) 0.346 % Reduction in Area: 73.3% % Reduction in Volume: 77.4% Epithelialization: None Wound Description Classification: Full Thickness Without Exposed Support Structures Exudate Amount: Medium  Exudate Type: Serosanguineous Exudate Color: red, brown Foul Odor After Cleansing: No Slough/Fibrino  No Wound Bed Granulation Amount: Medium (34-66%) Exposed Structure Granulation Quality: Pink Fascia Exposed: No Fat Layer (Subcutaneous Tissue) Exposed: Yes Tendon Exposed: No Muscle Exposed: No Joint Exposed: No Bone Exposed: No Treatment Notes Wound #1 (Amputation Site - Toe) Wound Laterality: Right Cleanser Byram Ancillary Kit - 15 Day Supply Discharge Instruction: Use supplies as instructed; Kit contains: (15) Saline Bullets; (15) 3x3 Gauze; 15 pr Gloves Peri-Wound Care EAMES, CERROS [name] ([id number]) [id number] Page 8 of 11 Topical Primary Dressing Gauze Discharge Instruction: As directed: dry, moistened with saline or moistened with Dakins Solution Hydrofera Blue Ready Transfer Foam, 2.5x2.5 (in/in) Discharge Instruction: Apply Hydrofera Blue Ready to wound bed as directed Secondary Dressing Conforming Guaze Roll-Large Discharge Instruction: Apply Conforming Stretch Guaze Bandage as directed Secured With Medipore T - 108M Medipore H Soft Cloth Surgical T ape ape, 2x2 (in/yd) Compression Wrap Compression Stockings Facilities manager) Signed: 09/03/2023 4:17:14 PM By: Midge Aver MSN RN CNS WTA Entered By: Midge Aver on 09/03/2023 11:57:09 -------------------------------------------------------------------------------- Wound Assessment Details Patient Name: Date of Service: [name], [place] RD [name] 09/03/2023 2:15 PM Medical Record Number: [id number] Patient Account Number: [id number] Date of Birth/Sex: Treating RN: 02/22/1941 (82 y.o. [name] Primary Care Nico Syme: [name] Other Clinician: Referring Lavine Hargrove: Treating Khaniyah Bezek/Extender: [name] in Treatment: 10 Wound Status Wound Number: 2 Primary Diabetic Wound/Ulcer of the Lower Extremity Etiology: Wound Location: Right, Plantar Foot Wound Open Wounding Event: Footwear Injury Status: Date Acquired: 06/16/2023 Comorbid Congestive  Heart Failure, Hypertension, Peripheral Venous Weeks Of Treatment: 10 History: Disease, Type II Diabetes, Neuropathy Clustered Wound: No Photos Wound Measurements Length: (cm) 0.5 Width: (cm) 0.4 Depth: (cm) 0.1 Area: (cm) 0.157 Saetern, Yazid [name] ([id number]) Volume: (cm) 0.016 % Reduction in Area: 59.2% % Reduction in Volume: 57.9% Epithelialization: Small (1-33%) [id number] Page 9 of 11 Wound Description Classification: Grade 1 Exudate Amount: Medium Exudate Type: Serosanguineous Exudate Color: red, brown Foul Odor After Cleansing: No Slough/Fibrino No Wound Bed Granulation Amount: Medium (34-66%) Exposed Structure Granulation Quality: Red, Pink Fascia Exposed: No Necrotic Amount: None Present (0%) Fat Layer (Subcutaneous Tissue) Exposed: No Tendon Exposed: No Muscle Exposed: No Joint Exposed: No Bone Exposed: No Treatment Notes Wound #2 (Foot) Wound Laterality: Plantar, Right Cleanser Soap and Water Discharge Instruction: Gently cleanse wound with antibacterial soap, rinse and pat dry prior to dressing wounds Peri-Wound Care Topical Santyl Collagenase Ointment, 30 (gm), tube Discharge Instruction: apply nickel thick to wound bed only Primary Dressing Gauze Discharge Instruction: As directed: dry, moistened with saline or moistened with Dakins Solution Hydrofera Blue Ready Transfer Foam, 2.5x2.5 (in/in) Discharge Instruction: Apply Hydrofera Blue Ready to wound bed as directed Secondary Dressing ABD Pad 5x9 (in/in) Discharge Instruction: Cover with ABD pad Secured With Medipore T - 108M Medipore H Soft Cloth Surgical T ape ape, 2x2 (in/yd) Kerlix Roll Sterile or Non-Sterile 6-ply 4.5x4 (yd/yd) Discharge Instruction: Apply Kerlix as directed Tubigrip Size E, 3.5x10 (in/yds) Discharge Instruction: Apply 3 Tubigrip E 3-finger-widths below knee to base of toes to secure dressing and/or for swelling. Compression Wrap Compression  Stockings Add-Ons Electronic Signature(s) Signed: 09/03/2023 4:17:14 PM By: Midge Aver MSN RN CNS WTA Entered By: Midge Aver on 09/03/2023 11:57:55 Wound Assessment Details -------------------------------------------------------------------------------- Cammy Brochure ([id number]) [phone].pdf Page 10 of 11 Patient Name: Date of Service: [name], [name] 09/03/2023 2:15 PM Medical Record Number: [id number] Patient Account Number: [id number] Date of Birth/Sex:  Treating RN: Jan 28, 1941 (82 y.o. [name] Primary Care Maquita Sandoval: [name] Other Clinician: Referring Dexton Zwilling: Treating Gustabo Gordillo/Extender: [name] in Treatment: 10 Wound Status Wound Number: 3 Primary Diabetic Wound/Ulcer of the Lower Extremity Etiology: Wound Location: Right, Lateral Foot Wound Open Wounding Event: Footwear Injury Status: Date Acquired: 06/16/2023 Comorbid Congestive Heart Failure, Hypertension, Peripheral Venous Weeks Of Treatment: 10 History: Disease, Type II Diabetes, Neuropathy Clustered Wound: No Photos Wound Measurements Length: (cm) 2.5 % Reduction in Area: -62.5% Width: (cm) 1.3 % Reduction in Volume: -712.7% Depth: (cm) 0.5 Epithelialization: None Area: (cm) 2.553 Volume: (cm) 1.276 Wound Description Classification: Grade 2 Foul Odor After Cleansing: No Exudate Amount: Medium Slough/Fibrino No Exudate Type: Serosanguineous Exudate Color: red, brown Wound Bed Granulation Amount: Medium (34-66%) Exposed Structure Granulation Quality: Pink, Pale Fascia Exposed: No Necrotic Amount: None Present (0%) Fat Layer (Subcutaneous Tissue) Exposed: No Tendon Exposed: No Muscle Exposed: No Joint Exposed: No Bone Exposed: No Treatment Notes Wound #3 (Foot) Wound Laterality: Right, Lateral Cleanser Soap and Water Discharge Instruction: Gently cleanse wound with antibacterial soap, rinse and pat dry prior to dressing  wounds Peri-Wound Care Topical Santyl Collagenase Ointment, 30 (gm), tube Discharge Instruction: apply nickel thick to wound bed only Primary Dressing Gauze Discharge Instruction: As directed: dry, moistened with saline or moistened with Dakins Solution Secondary Dressing ABD Pad 5x9 (in/in) Discharge Instruction: Cover with ABD pad HANSEL, DEGARMO [name] ([id number]) [phone].pdf Page 11 of 11 Secured With Medipore T - 68M Medipore H Soft Cloth Surgical T ape ape, 2x2 (in/yd) Kerlix Roll Sterile or Non-Sterile 6-ply 4.5x4 (yd/yd) Discharge Instruction: Apply Kerlix as directed Tubigrip Size E, 3.5x10 (in/yds) Discharge Instruction: Apply 3 Tubigrip E 3-finger-widths below knee to base of toes to secure dressing and/or for swelling. Compression Wrap Compression Stockings Add-Ons Electronic Signature(s) Signed: 09/03/2023 4:17:14 PM By: Midge Aver MSN RN CNS WTA Entered By: Midge Aver on 09/03/2023 11:58:30 -------------------------------------------------------------------------------- Vitals Details Patient Name: Date of Service: [name], [place] RD [name] 09/03/2023 2:15 PM Medical Record Number: [id number] Patient Account Number: [id number] Date of Birth/Sex: Treating RN: 26-Aug-1941 (82 y.o. [name] Primary Care Lucian Baswell: [name] Other Clinician: Referring Ndeye Tenorio: Treating Zebediah Beezley/Extender: [name] in Treatment: 10 Vital Signs Time Taken: 14:42 Temperature (F): 98.2 Height (in): 68 Pulse (bpm): 78 Weight (lbs): 167 Respiratory Rate (breaths/min): 18 Body Mass Index (BMI): 25.4 Blood Pressure (mmHg): 151/52 Reference Range: 80 - 120 mg / dl Electronic Signature(s) Signed: 09/03/2023 4:17:14 PM By: Midge Aver MSN RN CNS WTA Entered By: Midge Aver on 09/03/2023 11:43:28

## 2023-09-10 ENCOUNTER — Encounter: Payer: Medicare Other | Admitting: Physician Assistant

## 2023-09-10 DIAGNOSIS — T8131XA Disruption of external operation (surgical) wound, not elsewhere classified, initial encounter: Secondary | ICD-10-CM | POA: Diagnosis not present

## 2023-09-10 NOTE — Progress Notes (Signed)
ALYAN, LUEBKE D ([id number]) [id number] Page 1 of 11 Visit Report for 09/10/2023 Arrival Information Details Patient Name: Date of Service: [name], [name] 09/10/2023 8:30 A M Medical Record Number: [id number] Patient Account Number: [id number] Date of Birth/Sex: Treating RN: January 03, 1941 (82 y.o. [name] Primary Care Maryuri Warnke: Debbra Riding Other Clinician: Referring Cortlynn Hollinsworth: Treating Taisa Deloria/Extender: Jasmine Pang in Treatment: 11 Visit Information History Since Last Visit Added or deleted any medications: No Patient Arrived: Wheel Chair Any new allergies or adverse reactions: No Arrival Time: 08:38 Had a fall or experienced change in No Accompanied By: wife activities of daily living that may affect Transfer Assistance: EasyPivot Patient Lift risk of falls: Patient Identification Verified: Yes Hospitalized since last visit: No Secondary Verification Process Completed: Yes Has Dressing in Place as Prescribed: Yes Patient Requires Transmission-Based Precautions: No Has Compression in Place as Prescribed: No Patient Has Alerts: Yes Pain Present Now: No Patient Alerts: Patient on Blood Thinner diabetes type 2 Plavix Electronic Signature(s) Signed: 09/10/2023 10:22:49 AM By: Midge Aver MSN RN CNS WTA Entered By: Midge Aver on 09/10/2023 07:22:49 -------------------------------------------------------------------------------- Clinic Level of Care Assessment Details Patient Name: Date of Service: [name], [name] 09/10/2023 8:30 A M Medical Record Number: [id number] Patient Account Number: [id number] Date of Birth/Sex: Treating RN: 20-May-1941 (82 y.o. [name] Primary Care Jayvan Mcshan: Debbra Riding Other Clinician: Referring Leaann Nevils: Treating Silvina Hackleman/Extender: Jasmine Pang in Treatment: 11 Clinic Level of Care Assessment Items TOOL 1 Quantity Score []  - 0 Use when EandM  and Procedure is performed on INITIAL visit ASSESSMENTS - Nursing Assessment / Reassessment []  - 0 General Physical Exam (combine w/ comprehensive assessment (listed just below) when performed on new pt. evals) []  - 0 Comprehensive Assessment (HX, ROS, Risk Assessments, Wounds Hx, etc.) ASSESSMENTS - Wound and Skin Assessment / Reassessment []  - 0 Dermatologic / Skin Assessment (not related to wound area) NISHIL, LAZO D ([id number]) [id number] Page 2 of 11 ASSESSMENTS - Ostomy and/or Continence Assessment and Care []  - 0 Incontinence Assessment and Management []  - 0 Ostomy Care Assessment and Management (repouching, etc.) PROCESS - Coordination of Care []  - 0 Simple Patient / Family Education for ongoing care []  - 0 Complex (extensive) Patient / Family Education for ongoing care []  - 0 Staff obtains Chiropractor, Records, T Results / Process Orders est []  - 0 Staff telephones HHA, Nursing Homes / Clarify orders / etc []  - 0 Routine Transfer to another Facility (non-emergent condition) []  - 0 Routine Hospital Admission (non-emergent condition) []  - 0 New Admissions / Manufacturing engineer / Ordering NPWT Apligraf, etc. , []  - 0 Emergency Hospital Admission (emergent condition) PROCESS - Special Needs []  - 0 Pediatric / Minor Patient Management []  - 0 Isolation Patient Management []  - 0 Hearing / Language / Visual special needs []  - 0 Assessment of Community assistance (transportation, D/C planning, etc.) []  - 0 Additional assistance / Altered mentation []  - 0 Support Surface(s) Assessment (bed, cushion, seat, etc.) INTERVENTIONS - Miscellaneous []  - 0 External ear exam []  - 0 Patient Transfer (multiple staff / Nurse, adult / Similar devices) []  - 0 Simple Staple / Suture removal (25 or less) []  - 0 Complex Staple / Suture removal (26 or more) []  - 0 Hypo/Hyperglycemic Management (do not check if billed separately) []  - 0 Ankle /  Brachial Index (ABI) - do not check if billed separately Has the patient been seen at the hospital within the last  three years: Yes Total Score: 0 Level Of Care: ____ Electronic Signature(s) Signed: 09/10/2023 11:55:55 AM By: Midge Aver MSN RN CNS WTA Entered By: Midge Aver on 09/10/2023 24:40:10 -------------------------------------------------------------------------------- Encounter Discharge Information Details Patient Name: Date of Service: [name], [place] RD D. 09/10/2023 8:30 A M Medical Record Number: [id number] Patient Account Number: [id number] Date of Birth/Sex: Treating RN: [birth year] (82 y.o. [name] Primary Care Kinley Dozier: Debbra Riding Other Clinician: Referring Naika Noto: Treating Proctor Carriker/Extender: Jasmine Pang in Treatment: [address] Encounter Discharge Information Items Post Procedure Pastura D ([id number]) [id number] Page 3 of 11 Discharge Condition: Stable Temperature (F): 98.2 Ambulatory Status: Wheelchair Pulse (bpm): 76 Discharge Destination: Home Respiratory Rate (breaths/min): 18 Transportation: Private Auto Blood Pressure (mmHg): 145/53 Accompanied By: daughter in law Schedule Follow-up Appointment: Yes Clinical Summary of Care: Electronic Signature(s) Signed: 09/10/2023 10:25:24 AM By: Midge Aver MSN RN CNS WTA Entered By: Midge Aver on 09/10/2023 07:25:24 -------------------------------------------------------------------------------- Lower Extremity Assessment Details Patient Name: Date of Service: [name], [place] RD D. 09/10/2023 8:30 A M Medical Record Number: [id number] Patient Account Number: [id number] Date of Birth/Sex: Treating RN: 12/14/1940 (82 y.o. [name] Primary Care Laquasia Pincus: Debbra Riding Other Clinician: Referring Tashauna Caisse: Treating Odelle Kosier/Extender: Jasmine Pang in Treatment: 11 Electronic Signature(s) Signed: 09/10/2023  11:55:55 AM By: Midge Aver MSN RN CNS WTA Entered By: Midge Aver on 09/10/2023 29:51:88 -------------------------------------------------------------------------------- Multi Wound Chart Details Patient Name: Date of Service: [name], [place] RD D. 09/10/2023 8:30 A M Medical Record Number: [id number] Patient Account Number: [id number] Date of Birth/Sex: Treating RN: [birth year] (82 y.o. [name] Primary Care Ashunti Schofield: Debbra Riding Other Clinician: Referring Kenetha Cozza: Treating Domingos Riggi/Extender: Jasmine Pang in Treatment: 11 Vital Signs Height(in): 68 Pulse(bpm): 76 Weight(lbs): 167 Blood Pressure(mmHg): 145/53 Body Mass Index(BMI): 25.4 Temperature(F): 98.2 Respiratory Rate(breaths/min): 18 [1:Photos:] [[id number] Page 4 of 11] Right Amputation Site - Toe Right, Plantar Foot Right, Lateral Foot Wound Location: Surgical Injury Footwear Injury Footwear Injury Wounding Event: Dehisced Wound Diabetic Wound/Ulcer of the Lower Diabetic Wound/Ulcer of the Lower Primary Etiology: Extremity Extremity Congestive Heart Failure, Congestive Heart Failure, Congestive Heart Failure, Comorbid History: Hypertension, Peripheral Venous Hypertension, Peripheral Venous Hypertension, Peripheral Venous Disease, Type II Diabetes, Disease, Type II Diabetes, Disease, Type II Diabetes, Neuropathy Neuropathy Neuropathy 04/16/2023 06/16/2023 06/16/2023 Date Acquired: 11 11 11  Weeks of Treatment: Open Open Open Wound Status: No No No Wound Recurrence: 0.8x0.7x1.3 0.7x0.37x0.2 2.3x1x0.4 Measurements L x W x D (cm) 0.44 0.203 1.806 A (cm) : rea 0.572 0.041 0.723 Volume (cm) : 62.60% 47.30% -15.00% % Reduction in A rea: 62.70% -7.90% -360.50% % Reduction in Volume: Full Thickness Without Exposed Grade 1 Grade 2 Classification: Support Structures Medium Medium Medium Exudate A mount: Serosanguineous Serosanguineous  Serosanguineous Exudate Type: red, brown red, brown red, brown Exudate Color: Medium (34-66%) Medium (34-66%) Medium (34-66%) Granulation A mount: Pink Red, Pink Pink, Pale Granulation Quality: N/A None Present (0%) None Present (0%) Necrotic A mount: Fat Layer (Subcutaneous Tissue): Yes Fascia: No Fascia: No Exposed Structures: Fascia: No Fat Layer (Subcutaneous Tissue): No Fat Layer (Subcutaneous Tissue): No Tendon: No Tendon: No Tendon: No Muscle: No Muscle: No Muscle: No Joint: No Joint: No Joint: No Bone: No Bone: No Bone: No None Small (1-33%) None Epithelialization: Debridement - Excisional Debridement - Excisional Debridement - Excisional Debridement: Pre-procedure Verification/Time Out 09:32 09:32 09:32 Taken: Lidocaine 4% Topical Solution Lidocaine 4% Topical Solution Lidocaine 4% Topical Solution Pain Control: Subcutaneous, Slough Subcutaneous,  Slough Muscle, Subcutaneous, Slough Tissue Debrided: Skin/Subcutaneous Tissue Skin/Subcutaneous Tissue Skin/Subcutaneous Tissue/Muscle Level: 0.44 0.16 1.81 Debridement A (sq cm): rea Curette Curette Curette Instrument: Moderate Moderate Moderate Bleeding: Pressure Pressure Pressure Hemostasis A chieved: 0 0 0 Procedural Pain: 0 0 0 Post Procedural Pain: Procedure was tolerated well Procedure was tolerated well Procedure was tolerated well Debridement Treatment Response: 0.8x0.7x1.3 0.7x0.3x0.2 2.3x1x0.4 Post Debridement Measurements L x W x D (cm) 0.572 0.033 0.723 Post Debridement Volume: (cm) Debridement Debridement Debridement Procedures Performed: Treatment Notes Electronic Signature(s) Signed: 09/10/2023 10:22:58 AM By: Midge Aver MSN RN CNS WTA Entered By: Midge Aver on 09/10/2023 07:22:58 -------------------------------------------------------------------------------- Multi-Disciplinary Care Plan Details Patient Name: Date of Service: [name], [place] RD D. 09/10/2023 8:30 A M Medical  Record Number: [id number] Patient Account Number: [id number] KOUKI, HOLZAPFEL ([id number]) [id number] Page 5 of 11 Date of Birth/Sex: Treating RN: 20-May-1941 (82 y.o. [name] Primary Care Rishab Stoudt: Debbra Riding Other Clinician: Referring Peyson Delao: Treating Tommy Goostree/Extender: Jasmine Pang in Treatment: 11 Active Inactive Wound/Skin Impairment Nursing Diagnoses: Impaired tissue integrity Knowledge deficit related to ulceration/compromised skin integrity Goals: Patient/caregiver will verbalize understanding of skin care regimen Date Initiated: 06/20/2023 Date Inactivated: 07/18/2023 Target Resolution Date: 07/21/2023 Goal Status: Met Ulcer/skin breakdown will have a volume reduction of 30% by week 4 Date Initiated: 06/20/2023 Date Inactivated: 07/18/2023 Target Resolution Date: 07/21/2023 Goal Status: Met Ulcer/skin breakdown will have a volume reduction of 50% by week 8 Date Initiated: 06/20/2023 Date Inactivated: 08/08/2023 Target Resolution Date: 08/20/2023 Goal Status: Met Ulcer/skin breakdown will have a volume reduction of 80% by week 12 Date Initiated: 06/20/2023 Target Resolution Date: 09/20/2023 Goal Status: Active Ulcer/skin breakdown will heal within 14 weeks Date Initiated: 06/20/2023 Target Resolution Date: 10/04/2023 Goal Status: Active Interventions: Assess patient/caregiver ability to obtain necessary supplies Assess patient/caregiver ability to perform ulcer/skin care regimen upon admission and as needed Assess ulceration(s) every visit Provide education on ulcer and skin care Treatment Activities: Referred to DME Chitara Clonch for dressing supplies : 06/20/2023 Skin care regimen initiated : 06/20/2023 Topical wound management initiated : 06/20/2023 Notes: Electronic Signature(s) Signed: 09/10/2023 10:23:42 AM By: Midge Aver MSN RN CNS WTA Entered By: Midge Aver on 09/10/2023  07:23:42 -------------------------------------------------------------------------------- Pain Assessment Details Patient Name: Date of Service: [name], [place] RD D. 09/10/2023 8:30 A M Medical Record Number: [id number] Patient Account Number: [id number] Date of Birth/Sex: Treating RN: 1941/08/11 (82 y.o. [name] Primary Care Aftyn Nott: Debbra Riding Other Clinician: Referring Dene Nazir: Treating Shavona Gunderman/Extender: Jasmine Pang in Treatment: 11 Active Problems Location of Pain Severity and Description of Pain NAVIAN, AGUILA D ([id number]) [id number] Page 6 of 11 Patient Has Paino No Site Locations Pain Management and Medication Current Pain Management: Electronic Signature(s) Signed: 09/10/2023 11:55:55 AM By: Midge Aver MSN RN CNS WTA Entered By: Midge Aver on 09/10/2023 05:47:34 -------------------------------------------------------------------------------- Patient/Caregiver Education Details Patient Name: Date of Service: [name], [name] RD D. 12/24/2024andnbsp8:30 A M Medical Record Number: [id number] Patient Account Number: [id number] Date of Birth/Gender: Treating RN: Feb 09, 1941 (82 y.o. [name] Primary Care Physician: Debbra Riding Other Clinician: Referring Physician: Treating Physician/Extender: Jasmine Pang in Treatment: 11 Education Assessment Education Provided To: Patient Education Topics Provided Wound Debridement: Handouts: Wound Debridement Methods: Explain/Verbal Responses: State content correctly Wound/Skin Impairment: Handouts: Caring for Your Ulcer Methods: Explain/Verbal Responses: State content correctly Electronic Signature(s) Signed: 09/10/2023 11:55:55 AM By: Midge Aver MSN RN CNS WTA Entered By: Midge Aver on 09/10/2023 07:24:03 [place], Dimas Aguas D (  [id number]) [id number] Page 7 of  11 -------------------------------------------------------------------------------- Wound Assessment Details Patient Name: Date of Service: [name], [name] 09/10/2023 8:30 A M Medical Record Number: [id number] Patient Account Number: [id number] Date of Birth/Sex: Treating RN: 09-Feb-1941 (82 y.o. [name] Primary Care Romy Ipock: Debbra Riding Other Clinician: Referring Alianys Chacko: Treating Alisson Rozell/Extender: Jasmine Pang in Treatment: 11 Wound Status Wound Number: 1 Primary Dehisced Wound Etiology: Wound Location: Right Amputation Site - Toe Wound Open Wounding Event: Surgical Injury Status: Date Acquired: 04/16/2023 Comorbid Congestive Heart Failure, Hypertension, Peripheral Venous Weeks Of Treatment: 11 History: Disease, Type II Diabetes, Neuropathy Clustered Wound: No Photos Wound Measurements Length: (cm) 0.8 Width: (cm) 0.7 Depth: (cm) 1.3 Area: (cm) 0.44 Volume: (cm) 0.572 % Reduction in Area: 62.6% % Reduction in Volume: 62.7% Epithelialization: None Wound Description Classification: Full Thickness Without Exposed Support Structures Exudate Amount: Medium Exudate Type: Serosanguineous Exudate Color: red, brown Foul Odor After Cleansing: No Slough/Fibrino No Wound Bed Granulation Amount: Medium (34-66%) Exposed Structure Granulation Quality: Pink Fascia Exposed: No Fat Layer (Subcutaneous Tissue) Exposed: Yes Tendon Exposed: No Muscle Exposed: No Joint Exposed: No Bone Exposed: No Treatment Notes Wound #1 (Amputation Site - Toe) Wound Laterality: Right Cleanser [place] Ancillary Kit - 15 Day Supply Discharge Instruction: Use supplies as instructed; Kit contains: (15) Saline Bullets; (15) 3x3 Gauze; 15 pr Gloves CORDERRA, VONSTEIN D ([id number]) [id number] Page 8 of 11 Peri-Wound Care Topical Primary Dressing Gauze Discharge Instruction: As directed: dry, moistened with saline or moistened with Dakins  Solution Hydrofera Blue Ready Transfer Foam, 2.5x2.5 (in/in) Discharge Instruction: Apply Hydrofera Blue Ready to wound bed as directed Secondary Dressing Conforming Guaze Roll-Large Discharge Instruction: Apply Conforming Stretch Guaze Bandage as directed Secured With Medipore T - 53M Medipore H Soft Cloth Surgical T ape ape, 2x2 (in/yd) Compression Wrap Compression Stockings Add-Ons Electronic Signature(s) Signed: 09/10/2023 11:55:55 AM By: Midge Aver MSN RN CNS WTA Entered By: Midge Aver on 09/10/2023 06:07:21 -------------------------------------------------------------------------------- Wound Assessment Details Patient Name: Date of Service: [name], [place] RD D. 09/10/2023 8:30 A M Medical Record Number: [id number] Patient Account Number: [id number] Date of Birth/Sex: Treating RN: [birth year] (82 y.o. [name] Primary Care Chipper Koudelka: Debbra Riding Other Clinician: Referring Aracelys Glade: Treating Malissie Musgrave/Extender: Jasmine Pang in Treatment: 11 Wound Status Wound Number: 2 Primary Diabetic Wound/Ulcer of the Lower Extremity Etiology: Wound Location: Right, Plantar Foot Wound Open Wounding Event: Footwear Injury Status: Date Acquired: 06/16/2023 Comorbid Congestive Heart Failure, Hypertension, Peripheral Venous Weeks Of Treatment: 11 History: Disease, Type II Diabetes, Neuropathy Clustered Wound: No Photos Wound Measurements Length: (cm) 0.7 Width: (cm) 0.37 Depth: (cm) 0.2 Scarber, Mahamud D ([id number]) Area: (cm) 0.203 Volume: (cm) 0.041 % Reduction in Area: 47.3% % Reduction in Volume: -7.9% Epithelialization: Small (1-33%) [id number] Page 9 of 11 Wound Description Classification: Grade 1 Exudate Amount: Medium Exudate Type: Serosanguineous Exudate Color: red, brown Foul Odor After Cleansing: No Slough/Fibrino No Wound Bed Granulation Amount: Medium (34-66%) Exposed Structure Granulation Quality:  Red, Pink Fascia Exposed: No Necrotic Amount: None Present (0%) Fat Layer (Subcutaneous Tissue) Exposed: No Tendon Exposed: No Muscle Exposed: No Joint Exposed: No Bone Exposed: No Treatment Notes Wound #2 (Foot) Wound Laterality: Plantar, Right Cleanser Soap and Water Discharge Instruction: Gently cleanse wound with antibacterial soap, rinse and pat dry prior to dressing wounds Peri-Wound Care Topical Primary Dressing Gauze Discharge Instruction: As directed: dry, moistened with saline or moistened with Dakins Solution Hydrofera Blue Ready Transfer Foam, 2.5x2.5 (in/in) Discharge Instruction: Apply  Hydrofera Blue Ready to wound bed as directed Secondary Dressing ABD Pad 5x9 (in/in) Discharge Instruction: Cover with ABD pad Secured With Medipore T - 40M Medipore H Soft Cloth Surgical T ape ape, 2x2 (in/yd) Kerlix Roll Sterile or Non-Sterile 6-ply 4.5x4 (yd/yd) Discharge Instruction: Apply Kerlix as directed Tubigrip Size E, 3.5x10 (in/yds) Discharge Instruction: Apply 3 Tubigrip E 3-finger-widths below knee to base of toes to secure dressing and/or for swelling. Compression Wrap Compression Stockings Add-Ons Electronic Signature(s) Signed: 09/10/2023 11:55:55 AM By: Midge Aver MSN RN CNS WTA Entered By: Midge Aver on 09/10/2023 06:07:41 -------------------------------------------------------------------------------- Wound Assessment Details Patient Name: Date of Service: [name], [place] RD D. 09/10/2023 8:30 A M Medical Record Number: [id number] Patient Account Number: [id number] DALYNN, FARAG ([id number]) [id number] Page 10 of 11 Date of Birth/Sex: Treating RN: Apr 29, 1941 (82 y.o. [name] Primary Care Croix Presley: Debbra Riding Other Clinician: Referring Elhadj Girton: Treating Korin Hartwell/Extender: Jasmine Pang in Treatment: 11 Wound Status Wound Number: 3 Primary Diabetic Wound/Ulcer of the Lower  Extremity Etiology: Wound Location: Right, Lateral Foot Wound Open Wounding Event: Footwear Injury Status: Date Acquired: 06/16/2023 Comorbid Congestive Heart Failure, Hypertension, Peripheral Venous Weeks Of Treatment: 11 History: Disease, Type II Diabetes, Neuropathy Clustered Wound: No Photos Wound Measurements Length: (cm) 2.3 Width: (cm) 1 Depth: (cm) 0.4 Area: (cm) 1.806 Volume: (cm) 0.723 % Reduction in Area: -15% % Reduction in Volume: -360.5% Epithelialization: None Wound Description Classification: Grade 2 Exudate Amount: Medium Exudate Type: Serosanguineous Exudate Color: red, brown Foul Odor After Cleansing: No Slough/Fibrino No Wound Bed Granulation Amount: Medium (34-66%) Exposed Structure Granulation Quality: Pink, Pale Fascia Exposed: No Necrotic Amount: None Present (0%) Fat Layer (Subcutaneous Tissue) Exposed: No Tendon Exposed: No Muscle Exposed: No Joint Exposed: No Bone Exposed: No Treatment Notes Wound #3 (Foot) Wound Laterality: Right, Lateral Cleanser Soap and Water Discharge Instruction: Gently cleanse wound with antibacterial soap, rinse and pat dry prior to dressing wounds Peri-Wound Care Topical Santyl Collagenase Ointment, 30 (gm), tube Discharge Instruction: apply nickel thick to wound bed only Primary Dressing Gauze Discharge Instruction: As directed: dry, moistened with saline or moistened with Dakins Solution Secondary Dressing ABD Pad 5x9 (in/in) Discharge Instruction: Cover with ABD pad Secured With Medipore T - 40M Medipore H Soft Cloth Surgical T ape ape, 2x2 (in/yd) Kerlix Roll Sterile or Non-Sterile 6-ply 4.5x4 (yd/yd) TYQUAVION, CIMINELLI D ([id number]) [id number] Page 11 of 11 Discharge Instruction: Apply Kerlix as directed Tubigrip Size E, 3.5x10 (in/yds) Discharge Instruction: Apply 3 Tubigrip E 3-finger-widths below knee to base of toes to secure dressing and/or for swelling. Compression  Wrap Compression Stockings Add-Ons Electronic Signature(s) Signed: 09/10/2023 11:55:55 AM By: Midge Aver MSN RN CNS WTA Entered By: Midge Aver on 09/10/2023 06:08:01 -------------------------------------------------------------------------------- Vitals Details Patient Name: Date of Service: [name], [place] RD D. 09/10/2023 8:30 A M Medical Record Number: [id number] Patient Account Number: [id number] Date of Birth/Sex: Treating RN: 1940/09/18 (82 y.o. [name] Primary Care Sheral Pfahler: Debbra Riding Other Clinician: Referring Virgina Deakins: Treating Jayley Hustead/Extender: Jasmine Pang in Treatment: 11 Vital Signs Time Taken: 08:46 Temperature (F): 98.2 Height (in): 68 Pulse (bpm): 76 Weight (lbs): 167 Respiratory Rate (breaths/min): 18 Body Mass Index (BMI): 25.4 Blood Pressure (mmHg): 145/53 Reference Range: 80 - 120 mg / dl Electronic Signature(s) Signed: 09/10/2023 11:55:55 AM By: Midge Aver MSN RN CNS WTA Entered By: Midge Aver on 09/10/2023 05:47:21

## 2023-09-10 NOTE — Progress Notes (Addendum)
JACKHENRY, JAROSZEWSKI D ([id number]) [id number] Page 1 of 12 Visit Report for 09/10/2023 Chief Complaint Document Details Patient Name: Date of Service: [name], [place] RD D. 09/10/2023 8:30 A M Medical Record Number: [id number] Patient Account Number: [id number] Date of Birth/Sex: Treating RN: 01/23/1941 (82 y.o. [name] Primary Care Provider: Debbra Riding Other Clinician: Referring Provider: Treating Provider/Extender: Jasmine Pang in Treatment: 11 Information Obtained from: Patient Chief Complaint Right foot ulcers Electronic Signature(s) Signed: 09/10/2023 8:49:54 AM By: Allen Derry PA-C Entered By: Allen Derry on 09/10/2023 05:49:54 -------------------------------------------------------------------------------- Debridement Details Patient Name: Date of Service: [name], HO [place] RD D. 09/10/2023 8:30 A M Medical Record Number: [id number] Patient Account Number: [id number] Date of Birth/Sex: Treating RN: 02/01/1941 (82 y.o. [name] Primary Care Provider: Debbra Riding Other Clinician: Referring Provider: Treating Provider/Extender: Jasmine Pang in Treatment: 11 Debridement Performed for Assessment: Wound #3 Right,Lateral Foot Performed By: Physician Allen Derry, PA-C The following information was scribed by: Midge Aver The information was scribed for: Allen Derry Debridement Type: Debridement Severity of Tissue Pre Debridement: Fat layer exposed Level of Consciousness (Pre-procedure): Awake and Alert Pre-procedure Verification/Time Out Yes - 09:32 Taken: Start Time: 09:32 Pain Control: Lidocaine 4% T opical Solution Percent of Wound Bed Debrided: 100% T Area Debrided (cm): otal 1.81 Tissue and other material debrided: Viable, Non-Viable, Muscle, Slough, Subcutaneous, Slough Level: Skin/Subcutaneous Tissue/Muscle Debridement Description: Excisional Instrument: Curette Bleeding:  Moderate DEVEON, EZEKIEL D ([id number]) [id number] Page 2 of 12 Hemostasis Achieved: Pressure Procedural Pain: 0 Post Procedural Pain: 0 Response to Treatment: Procedure was tolerated well Level of Consciousness (Post- Awake and Alert procedure): Post Debridement Measurements of Total Wound Length: (cm) 2.3 Width: (cm) 1 Depth: (cm) 0.4 Volume: (cm) 0.723 Character of Wound/Ulcer Post Debridement: Stable Severity of Tissue Post Debridement: Fat layer exposed Post Procedure Diagnosis Same as Pre-procedure Electronic Signature(s) Signed: 09/10/2023 11:55:55 AM By: Midge Aver MSN RN CNS WTA Signed: 09/10/2023 3:46:23 PM By: Allen Derry PA-C Entered By: Midge Aver on 09/10/2023 06:33:33 -------------------------------------------------------------------------------- Debridement Details Patient Name: Date of Service: [name], HO [place] RD D. 09/10/2023 8:30 A M Medical Record Number: [id number] Patient Account Number: [id number] Date of Birth/Sex: Treating RN: 04-Jun-1941 (82 y.o. [name] Primary Care Provider: Debbra Riding Other Clinician: Referring Provider: Treating Provider/Extender: Jasmine Pang in Treatment: 11 Debridement Performed for Assessment: Wound #2 Right,Plantar Foot Performed By: Physician Allen Derry, PA-C Debridement Type: Debridement Severity of Tissue Pre Debridement: Fat layer exposed Level of Consciousness (Pre-procedure): Awake and Alert Pre-procedure Verification/Time Out Yes - 09:32 Taken: Start Time: 09:32 Pain Control: Lidocaine 4% T opical Solution Percent of Wound Bed Debrided: 100% T Area Debrided (cm): otal 0.16 Tissue and other material debrided: Viable, Non-Viable, Slough, Subcutaneous, Slough Level: Skin/Subcutaneous Tissue Debridement Description: Excisional Instrument: Curette Bleeding: Moderate Hemostasis Achieved: Pressure Procedural Pain: 0 Post Procedural Pain: 0 Response  to Treatment: Procedure was tolerated well Level of Consciousness (Post- Awake and Alert procedure): Post Debridement Measurements of Total Wound Length: (cm) 0.7 Width: (cm) 0.3 Depth: (cm) 0.2 Volume: (cm) 0.033 Character of Wound/Ulcer Post Debridement: Stable Severity of Tissue Post Debridement: Fat layer exposed DANTON, GAHAN D ([id number]) [id number] Page 3 of 12 Post Procedure Diagnosis Same as Pre-procedure Electronic Signature(s) Signed: 09/10/2023 11:55:55 AM By: Midge Aver MSN RN CNS WTA Signed: 09/10/2023 3:46:23 PM By: Allen Derry PA-C Entered By: Midge Aver on 09/10/2023 06:38:31 -------------------------------------------------------------------------------- Debridement Details Patient Name: Date of Service: [name], [place]  RD D. 09/10/2023 8:30 A M Medical Record Number: [id number] Patient Account Number: [id number] Date of Birth/Sex: Treating RN: [birth year] (82 y.o. 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 Performed By: Physician Allen Derry, PA-C Debridement Type: Debridement Level of Consciousness (Pre-procedure): Awake and Alert Pre-procedure Verification/Time Out Yes - 09:32 Taken: Start Time: 09:32 Pain Control: Lidocaine 4% T opical Solution Percent of Wound Bed Debrided: 100% T Area Debrided (cm): otal 0.44 Tissue and other material debrided: Viable, Non-Viable, Slough, Subcutaneous, Slough Level: Skin/Subcutaneous Tissue Debridement Description: Excisional Instrument: Curette Bleeding: Moderate Hemostasis Achieved: Pressure Procedural Pain: 0 Post Procedural Pain: 0 Response to Treatment: Procedure was tolerated well Level of Consciousness (Post- Awake and Alert procedure): Post Debridement Measurements of  Total Wound Length: (cm) 0.8 Width: (cm) 0.7 Depth: (cm) 1.3 Volume: (cm) 0.572 Character of Wound/Ulcer Post Debridement: Stable Post Procedure Diagnosis Same as Pre-procedure Electronic Signature(s) Signed: 09/10/2023 11:55:55 AM By: Midge Aver MSN RN CNS WTA Signed: 09/10/2023 3:46:23 PM By: Allen Derry PA-C Entered By: Midge Aver on 09/10/2023 06:39:05 Peter Congo D ([id number]) [id number] Page 4 of 12 -------------------------------------------------------------------------------- HPI Details Patient Name: Date of Service: [name], [name] 09/10/2023 8:30 A M Medical Record Number: [id number] Patient Account Number: [id number] Date of Birth/Sex: Treating RN: 1941-06-19 (82 y.o. [name] Primary Care Provider: Debbra Riding Other Clinician: Referring Provider: Treating Provider/Extender: Jasmine Pang in Treatment: 11 History of Present Illness Chronic/Inactive Conditions Condition 1: Notes from Vascular 05/21/23: Right below-the-knee popliteal artery to peroneal artery bypass with reverse saphenous vein graft. This was done on 04/11/2023. Noninvasive studies in our an ABI 0.61 on the right and 0.67 on the left. T oday arterial duplexes were done which shows biphasic/triphasic waveforms throughout the lower extremities down to the level of the tibial vessels. 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 This is suggestive that the patient's bypass is patent although it is not there is no noted occlusion on duplex 1. Atherosclerosis of native arteries of the extremities with ulceration (HCC) Based on noninvasive studies today appears that the patient would have possible marginal ability for wound healing. Concerned that some of the patient is usually a small vessel disease which may proved to have a little  bit more difficult. Will have the patient return in several weeks to see if there has been improvement. They are advised that if the wound deteriorates or begins to show signs of starting to contact us for sooner follow-up. 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 With that being said that area is somewhat deep to be perfectly honest. It does have a little bit concerned. The remaining areas all came from the boot he was wearing and areas that rubbed they have gotten rid of the boot this will not happen again as far as that is concerned. With that being said the patient has had vascular intervention and the note is above as documented in the vascular note. With that being said it does appear that the patient has what is termed "possible marginal ability for wound healing". I think this is basically  something this can be quite difficult for [place] to completely close. I am not seeing them possible but again there is a long road ahead. Patient does have a history of diabetes mellitus type 2, hypertension, peripheral vascular disease, coronary artery disease, long-term use of anticoagulants, chronic kidney disease stage III, and as of right now there is no direct evidence of continued and ongoing osteomyelitis although I do question whether or not there may be an issue here. 06-27-2023 upon evaluation today patient appears to be doing okay in regard to his wounds. He is showing signs of some improvement which is good news although this appears to be a little bit too moist in regard to the wounds on his feet in general. I do believe that he would benefit from a switching dressings to silver alginate away from the Xeroform at this point unfortunately. With that being said the original wound for which she was referred actually does  appear to be doing okay at this point in my opinion. I am actually somewhat pleased with where we stand in that regard. I do not see any evidence of active infection at this time which is good. I think the Dakin's moistened gauze is helping. 07-04-2023 upon evaluation today patient appears to be doing well currently in regard to his wounds all things considered. I do believe that he is making headway towards some closure and this is good news. Fortunately I do not see any evidence of active infection looking or systemically which is great news as well. No fevers, chills, nausea, vomiting, or diarrhea. 07-11-2023 upon evaluation today patient appears to be doing well currently in regard to his wound. He has primary wound at the amputation site is better and the other wounds are all showing signs of improvement as well the worst being the right lateral foot but even that seems better with the Betadine that we switch to last week. 07-18-2023 upon evaluation today patient appears to be doing well currently in regard to his wound. He has been tolerating the dressing changes without complication. Fortunately I do not see any signs of active infection at this time. 07-25-2023 upon evaluation today patient appears to be doing well currently in regard to his wound. He has been taught routine the dressing changes and all 3 wounds look better especially the amputation site. Overall I am extremely pleased with where we stand currently. 08-01-2023 upon evaluation today patient appears to be doing well currently in regard to his wound. Has been tolerating the dressing changes without complication Iodoflex seems to be cleaning up the side wounds although this is very slow the medial looks better than lateral. With the big guard to the primary surgical site this looks much better. 08-08-2023 upon evaluation today patient appears to be doing pretty well currently in regard to the surgical wound although his other 2  wounds are not doing nearly as well. I think we may need to make a switch here what we are doing I think of switching her over to [facility]. 08-20-2023 upon evaluation patient's wound bed actually showed signs of good granulation epithelization at this point. Fortunately I do not see any signs of infection at this time which is great news and in general I do feel like there were making really good headway here towards closure. All of his wounds appear to be doing better I think the Melburn Popper is doing a good job. 09-04-2023 upon evaluation today patient appears to be doing well currently in regard to his  wounds for the most part he is showing signs of improvement which is excellent. I do believe that the lateral foot wound is the worst out of all this and they all are gena require sharp debridement today. 09-10-23 upon evaluation today patient appears to be doing well currently in regard to his wounds. All are showing signs of improvement, although the right lateral foot wound is still the one lagging behind the most. There does not appear to be any evidence of obvious active infection at this point, which is good news. Electronic Signature(s) Signed: 09/10/2023 3:46:23 PM By: Fritzi Mandes, Fernando Salinas D ([id number]) [id number] Page 5 of 12 Entered By: Allen Derry on 09/10/2023 12:02:46 -------------------------------------------------------------------------------- Physical Exam Details Patient Name: Date of Service: [name], [place] RD D. 09/10/2023 8:30 A M Medical Record Number: [id number] Patient Account Number: [id number] Date of Birth/Sex: Treating RN: [birth year] (82 y.o. [name] Primary Care Provider: Debbra Riding Other Clinician: Referring Provider: Treating Provider/Extender: Jasmine Pang in Treatment: 11 Constitutional Well-nourished and well-hydrated in no acute distress. Respiratory normal breathing without  difficulty. Psychiatric this patient is able to make decisions and demonstrates good insight into disease process. Alert and Oriented x 3. pleasant and cooperative. Notes Patient wound bad did require sharp debridement at each location. This was minimal on the plantar foot as well as at the surgical site. However, on the lateral portion of the foot, I did have to remove necrotic muscle along with skin down to good subcutaneous tissue. He tolerated this with minimal discomfort, which was good news. Electronic Signature(s) Signed: 09/10/2023 3:46:23 PM By: Allen Derry PA-C Entered By: Allen Derry on 09/10/2023 12:03:27 -------------------------------------------------------------------------------- Physician Orders Details Patient Name: Date of Service: [name], [place] RD D. 09/10/2023 8:30 A M Medical Record Number: [id number] Patient Account Number: [id number] Date of Birth/Sex: Treating RN: May 10, 1941 (82 y.o. 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 Nurse Visit as needed [employer] wounds with antibacterial soap and water. May shower; gently cleanse wound with antibacterial soap, rinse and pat dry prior to dressing wounds Anesthetic (Use 'Patient Medications' Section for Anesthetic Order Entry) Lidocaine applied to wound bed Edema Control - Orders / Instructions Tubigrip single layer applied. - E ABD and Kerlix for drainage on the leg Elevate, Exercise Daily and A void Standing for Long Periods of Time. Elevate legs to the level of the heart and pump ankles as often as possible Elevate leg(s) parallel to the floor when sitting. Wound Treatment Wound #1 - Amputation Site - Toe Wound Laterality: Right Cleanser: Byram Ancillary Kit - 15 Day Supply (Generic) 1 x Per Day/30 Days Discharge Instructions: Use supplies  as instructed; Kit contains: (15) Saline Bullets; (15) 3x3 Gauze; 15 pr Gloves Prim Dressing: Gauze 1 x Per Day/30 Days ary Discharge Instructions: As directed: dry, moistened with saline or moistened with Dakins Solution Prim Dressing: Hydrofera Blue Ready Transfer Foam, 2.5x2.5 (in/in) 1 x Per Day/30 Days ary Discharge Instructions: Apply Hydrofera Blue Ready to wound bed as directed Secondary Dressing: Conforming Guaze Roll-Large (Generic) 1 x Per Day/30 Days Discharge Instructions: Apply Conforming Stretch Guaze Bandage as directed Secured With: Medipore T - 64M Medipore H Soft Cloth Surgical T ape ape, 2x2 (in/yd) (Generic) 1 x Per Day/30 Days Wound #2 - Foot Wound Laterality: Plantar, Right Cleanser: Soap and Water 3 x Per Week/30 Days Discharge Instructions: Gently cleanse wound with antibacterial soap, rinse and pat dry prior to dressing wounds Prim Dressing: Gauze 3 x Per Week/30 Days ary Discharge Instructions: As directed: dry, moistened with saline or moistened with Dakins Solution Prim Dressing:  Hydrofera Blue Ready Transfer Foam, 2.5x2.5 (in/in) 3 x Per Week/30 Days ary Discharge Instructions: Apply Hydrofera Blue Ready to wound bed as directed Secondary Dressing: ABD Pad 5x9 (in/in) (Generic) 3 x Per Week/30 Days Discharge Instructions: Cover with ABD pad Secured With: Medipore T - 64M Medipore H Soft Cloth Surgical T ape ape, 2x2 (in/yd) (Generic) 3 x Per Week/30 Days Secured With: [employer] Sterile or Non-Sterile 6-ply 4.5x4 (yd/yd) (Generic) 3 x Per Week/30 Days Discharge Instructions: Apply Kerlix as directed Secured With: Tubigrip Size E, 3.5x10 (in/yds) 3 x Per Week/30 Days Discharge Instructions: Apply 3 Tubigrip E 3-finger-widths below knee to base of toes to secure dressing and/or for swelling. Wound #3 - Foot Wound Laterality: Right, Lateral Cleanser: Soap and Water 3 x Per Week/30 Days Discharge Instructions: Gently cleanse wound with antibacterial soap, rinse and pat dry prior to dressing wounds Topical: Santyl Collagenase Ointment, 30 (gm), tube 3 x Per Week/30 Days Discharge Instructions: apply nickel thick to wound bed only Prim Dressing: Gauze 3 x Per Week/30 Days ary Discharge Instructions: As directed: dry, moistened with saline or moistened with Dakins Solution Secondary Dressing: ABD Pad 5x9 (in/in) (Generic) 3 x Per Week/30 Days Discharge Instructions: Cover with ABD pad TAVARAS, NAHAS D ([id number]) [id number] Page 7 of 12 Secured With: Medipore T - 64M Medipore H Soft Cloth Surgical T ape ape, 2x2 (in/yd) (Generic) 3 x Per Week/30 Days Secured With: [employer] or Non-Sterile 6-ply 4.5x4 (yd/yd) (Generic) 3 x Per Week/30 Days Discharge Instructions: Apply Kerlix as directed Secured With: Tubigrip Size E, 3.5x10 (in/yds) 3 x Per Week/30 Days Discharge Instructions: Apply 3 Tubigrip E 3-finger-widths below knee to base of toes to secure dressing and/or for swelling. Electronic Signature(s) Signed: 09/10/2023 11:55:55 AM  By: Midge Aver MSN RN CNS WTA Signed: 09/10/2023 3:46:23 PM By: Allen Derry PA-C Entered By: Midge Aver on 09/10/2023 07:23:18 -------------------------------------------------------------------------------- Problem List Details Patient Name: Date of Service: [name], [place] RD D. 09/10/2023 8:30 A M Medical Record Number: [id number] Patient Account Number: [id number] Date of Birth/Sex: Treating RN: 11-23-1940 (83 y.o. [name] Primary Care Provider: Debbra Riding Other Clinician: Referring Provider: Treating Provider/Extender: Jasmine Pang in Treatment: 11 Active Problems ICD-10 Encounter Code Description Active Date MDM Diagnosis T81.31XA Disruption of external operation (surgical) wound, not elsewhere classified, 06/20/2023 No Yes initial encounter E11.621 Type 2 diabetes mellitus with foot ulcer 06/20/2023 No Yes L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 06/20/2023 No Yes L97.513 Non-pressure chronic ulcer of  other part of right foot with necrosis of muscle 09/03/2023 No Yes I10 Essential (primary) hypertension 06/20/2023 No Yes 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 06/20/2023 No Yes I25.10 Atherosclerotic heart disease of native coronary artery without angina pectoris 06/20/2023 No Yes Z79.01 Long term (current) use of anticoagulants 06/20/2023 No Yes MONTEE, LINDLY ([id number]) [phone].pdf Page 8 of 12 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, stage 3 unspecified 06/20/2023 No Yes Inactive Problems Resolved Problems Electronic Signature(s) Signed: 09/10/2023 3:46:23 PM By: Allen Derry PA-C Previous Signature: 09/10/2023 8:49:50 AM Version By: Allen Derry PA-C Entered By: Allen Derry on 09/10/2023 12:06:33 -------------------------------------------------------------------------------- Progress Note Details Patient Name: Date of Service: [name], [place] RD D. 09/10/2023  8:30 A M Medical Record Number: [id number] Patient Account Number: [id number] Date of Birth/Sex: Treating RN: Aug 21, 1941 (82 y.o. [name] Primary Care Provider: Debbra Riding Other Clinician: Referring Provider: Treating Provider/Extender: Jasmine Pang in Treatment: 11 Subjective Chief Complaint Information obtained from Patient Right foot ulcers History of Present Illness (HPI) Chronic/Inactive Condition: Notes from Vascular 05/21/23: Right below-the-knee popliteal artery to peroneal artery bypass with reverse saphenous vein graft. This was done on 04/11/2023. Noninvasive studies in our an ABI 0.61 on the right and 0.67 on the left. T oday arterial duplexes were done which shows biphasic/triphasic waveforms throughout the lower extremities down to the level of the tibial vessels. 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 This is suggestive that the patient's bypass is patent although it is not there is no noted occlusion on duplex 1. Atherosclerosis of native arteries of the extremities with ulceration (HCC) Based on noninvasive studies today appears that the patient would have possible marginal ability for wound healing. Concerned that some of the patient is usually a small vessel disease which may proved to have a little bit more difficult. Will have the patient return in several weeks to see if there has been improvement. They are advised that if the wound deteriorates or begins to show signs of starting to contact us for sooner follow-up. 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 With that being said that area is somewhat deep to be perfectly honest. It does have a little  bit concerned. The remaining areas all came from the boot he was wearing and areas that rubbed they have gotten rid of the boot this will not happen again as far as that is concerned. With that being said the patient has had vascular intervention and the note is above as documented in the vascular note. With that being said it does appear that the patient has what is termed "possible marginal ability for wound healing". I think this is basically something this can be quite difficult for [place] to completely close. I am not seeing them possible but again there is a long road ahead. Patient does have a history of diabetes mellitus type 2, hypertension, peripheral vascular disease, coronary artery disease, long-term use of anticoagulants, chronic kidney disease stage III, and as of right now there is no direct evidence of continued and ongoing osteomyelitis although I do question whether or not there may be an issue here. 06-27-2023 upon evaluation today patient appears to be doing okay in regard to his wounds. He is showing signs of some improvement which  is good news although this appears to be a little bit too moist in regard to the wounds on his feet in general. I do believe that he would benefit from a switching dressings to silver alginate away from the Xeroform at this point unfortunately. With that being said the original wound for which she was referred actually does appear to be doing okay at this point in my opinion. I am actually somewhat pleased with where we stand in that regard. I do not see any evidence of active infection at this time which is good. I think the Dakin's moistened gauze is helping. 07-04-2023 upon evaluation today patient appears to be doing well currently in regard to his wounds all things considered. I do believe that he is making headway towards some closure and this is good news. Fortunately I do not see any evidence of active infection looking or systemically which is  great news as well. No fevers, chills, nausea, vomiting, or diarrhea. 07-11-2023 upon evaluation today patient appears to be doing well currently in regard to his wound. He has primary wound at the amputation site is better and the other wounds are all showing signs of improvement as well the worst being the right lateral foot but even that seems better with the Betadine that we switch to last week. 07-18-2023 upon evaluation today patient appears to be doing well currently in regard to his wound. He has been tolerating the dressing changes without complication. Fortunately I do not see any signs of active infection at this time. SONG, GENNUSA D ([id number]) [id number] Page 9 of 12 07-25-2023 upon evaluation today patient appears to be doing well currently in regard to his wound. He has been taught routine the dressing changes and all 3 wounds look better especially the amputation site. Overall I am extremely pleased with where we stand currently. 08-01-2023 upon evaluation today patient appears to be doing well currently in regard to his wound. Has been tolerating the dressing changes without complication Iodoflex seems to be cleaning up the side wounds although this is very slow the medial looks better than lateral. With the big guard to the primary surgical site this looks much better. 08-08-2023 upon evaluation today patient appears to be doing pretty well currently in regard to the surgical wound although his other 2 wounds are not doing nearly as well. I think we may need to make a switch here what we are doing I think of switching her over to [facility]. 08-20-2023 upon evaluation patient's wound bed actually showed signs of good granulation epithelization at this point. Fortunately I do not see any signs of infection at this time which is great news and in general I do feel like there were making really good headway here towards closure. All of his wounds appear to be  doing better I think the Melburn Popper is doing a good job. 09-04-2023 upon evaluation today patient appears to be doing well currently in regard to his wounds for the most part he is showing signs of improvement which is excellent. I do believe that the lateral foot wound is the worst out of all this and they all are gena require sharp debridement today. 09-10-23 upon evaluation today patient appears to be doing well currently in regard to his wounds. All are showing signs of improvement, although the right lateral foot wound is still the one lagging behind the most. There does not appear to be any evidence of obvious active infection at this point, which is good news.  Objective Constitutional Well-nourished and well-hydrated in no acute distress. Vitals Time Taken: 8:46 AM, Height: 68 in, Weight: 167 lbs, BMI: 25.4, Temperature: 98.2 F, Pulse: 76 bpm, Respiratory Rate: 18 breaths/min, Blood Pressure: 145/53 mmHg. Respiratory normal breathing without difficulty. Psychiatric this patient is able to make decisions and demonstrates good insight into disease process. Alert and Oriented x 3. pleasant and cooperative. General Notes: Patient wound bad did require sharp debridement at each location. This was minimal on the plantar foot as well as at the surgical site. However, on the lateral portion of the foot, I did have to remove necrotic muscle along with skin down to good subcutaneous tissue. He tolerated this with minimal discomfort, which was good news. Integumentary (Hair, Skin) Wound #1 status is Open. Original cause of wound was Surgical Injury. The date acquired was: 04/16/2023. The wound has been in treatment 11 weeks. 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 oe. There is Fat Layer (Subcutaneous Tissue) exposed. There is a medium amount of serosanguineous drainage noted. There is medium  (34-66%) pink granulation within the wound bed. Wound #2 status is Open. Original cause of wound was Footwear Injury. The date acquired was: 06/16/2023. The wound has been in treatment 11 weeks. The wound is located on the Right,Plantar Foot. The wound measures 0.7cm length x 0.37cm width x 0.2cm depth; 0.203cm^2 area and 0.041cm^3 volume. There is a medium amount of serosanguineous drainage noted. There is medium (34-66%) red, pink granulation within the wound bed. There is no necrotic tissue within the wound bed. Wound #3 status is Open. Original cause of wound was Footwear Injury. The date acquired was: 06/16/2023. The wound has been in treatment 11 weeks. The wound is located on the Right,Lateral Foot. The wound measures 2.3cm length x 1cm width x 0.4cm depth; 1.806cm^2 area and 0.723cm^3 volume. There is a medium amount of serosanguineous drainage noted. There is medium (34-66%) pink, pale granulation within the wound bed. There is no necrotic tissue within the wound bed. 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 There was a Excisional Skin/Subcutaneous Tissue oe Debridement with a total area of 0.44 sq cm performed by Allen Derry, PA-C. With the following  instrument(s): Curette to remove Viable and Non-Viable tissue/material. Material removed includes Subcutaneous Tissue and Slough and after achieving pain control using Lidocaine 4% T opical Solution. No specimens were taken. A time out was conducted at 09:32, prior to the start of the procedure. A Moderate amount of bleeding was controlled with Pressure. The procedure was tolerated well with a pain level of 0 throughout and a pain level of 0 following the procedure. Post Debridement Measurements: 0.8cm length x 0.7cm width x 1.3cm depth; 0.572cm^3 volume. Character of Wound/Ulcer Post Debridement is stable. Post procedure Diagnosis Wound #1: Same as Pre-Procedure Wound #2 Pre-procedure  diagnosis of Wound #2 is a Diabetic Wound/Ulcer of the Lower Extremity located on the Right,Plantar Foot .Severity of Tissue Pre Debridement is: Fat layer exposed. There was a Excisional Skin/Subcutaneous Tissue Debridement with a total area of 0.16 sq cm performed by Allen Derry, PA-C. With the following instrument(s): Curette to remove Viable and Non-Viable tissue/material. Material removed includes Subcutaneous Tissue and Slough and after achieving pain control using Lidocaine 4% T opical Solution. No specimens were taken. A time out was conducted at 09:32, prior to the start of the procedure. A Moderate amount of bleeding was controlled with Pressure. The procedure was tolerated well with a pain level of 0 throughout and a pain level of 0 following the procedure. Post Debridement Measurements: 0.7cm length x 0.3cm width x 0.2cm depth; 0.033cm^3 volume. Character of Wound/Ulcer Post Debridement is stable. Severity of Tissue Post Debridement is: Fat layer exposed. Post procedure Diagnosis Wound #2: Same as Pre-Procedure Wound #3 Pre-procedure diagnosis of Wound #3 is a Diabetic Wound/Ulcer of the Lower Extremity located on the Right,Lateral Foot .Severity of Tissue Pre Debridement is: Fat layer exposed. There was a  Excisional Skin/Subcutaneous Tissue/Muscle Debridement with a total area of 1.81 sq cm performed by Allen Derry, PA-C. With the following instrument(s): Curette to remove Viable and Non-Viable tissue/material. Material removed includes Muscle, Subcutaneous Tissue, and Slough after achieving pain control using Lidocaine 4% Topical Solution. No specimens were taken. A time out was conducted at 09:32, prior to the start of the procedure. A Moderate amount of bleeding was controlled with Pressure. The procedure was tolerated well with a pain level of 0 throughout and a pain level of 0 following the procedure. Post Debridement Measurements: 2.3cm length x 1cm width x 0.4cm depth; 0.723cm^3 volume. Character of Wound/Ulcer Post Debridement is stable. Severity of Tissue Post Debridement is: Fat layer exposed. Post procedure Diagnosis Wound #3: Same as Pre-Procedure Plan Follow-up Appointments: Return Appointment in 1 week. Nurse Visit as needed Bathing/ Shower/ Hygiene: Wash wounds with antibacterial soap and water. May shower; gently cleanse wound with antibacterial soap, rinse and pat dry prior to dressing wounds Anesthetic (Use 'Patient Medications' Section for Anesthetic Order Entry): Lidocaine applied to wound bed Edema Control - Orders / Instructions: Tubigrip single layer applied. - E ABD and Kerlix for drainage on the leg Elevate, Exercise Daily and Avoid Standing for Long Periods of Time. Elevate legs to the level of the heart and pump ankles as often as possible Elevate leg(s) parallel to the floor when sitting. WOUND #1: - Amputation Site - T oe Wound Laterality: Right Cleanser: Byram Ancillary Kit - 15 Day Supply (Generic) 1 x Per Day/30 Days Discharge Instructions: Use supplies as instructed; Kit contains: (15) Saline Bullets; (15) 3x3 Gauze; 15 pr Gloves Prim Dressing: Gauze 1 x Per Day/30 Days ary Discharge Instructions: As directed: dry, moistened with saline or moistened with  Dakins Solution Prim Dressing: Hydrofera Blue Ready Transfer Foam, 2.5x2.5 (in/in) 1 x Per Day/30 Days ary Discharge Instructions: Apply Hydrofera Blue Ready to wound bed as directed Secondary Dressing: Conforming Guaze Roll-Large (Generic) 1 x Per Day/30 Days Discharge Instructions: Apply Conforming Stretch Guaze Bandage as directed Secured With: Medipore T - 1M Medipore H Soft Cloth Surgical T ape ape, 2x2 (in/yd) (Generic) 1 x Per Day/30 Days WOUND #2: - Foot Wound Laterality: Plantar, Right Cleanser: Soap and Water 3 x Per Week/30 Days Discharge Instructions: Gently cleanse wound with antibacterial soap, rinse and pat dry prior to dressing wounds Prim Dressing: Gauze 3 x  Per Week/30 Days ary Discharge Instructions: As directed: dry, moistened with saline or moistened with Dakins Solution Prim Dressing: Hydrofera Blue Ready Transfer Foam, 2.5x2.5 (in/in) 3 x Per Week/30 Days ary Discharge Instructions: Apply Hydrofera Blue Ready to wound bed as directed Secondary Dressing: ABD Pad 5x9 (in/in) (Generic) 3 x Per Week/30 Days Discharge Instructions: Cover with ABD pad Secured With: Medipore T - 19M Medipore H Soft Cloth Surgical T ape ape, 2x2 (in/yd) (Generic) 3 x Per Week/30 Days Secured With: [employer] Sterile or Non-Sterile 6-ply 4.5x4 (yd/yd) (Generic) 3 x Per Week/30 Days Discharge Instructions: Apply Kerlix as directed Secured With: Tubigrip Size E, 3.5x10 (in/yds) 3 x Per Week/30 Days Discharge Instructions: Apply 3 Tubigrip E 3-finger-widths below knee to base of toes to secure dressing and/or for swelling. WOUND #3: - Foot Wound Laterality: Right, Lateral Cleanser: Soap and Water 3 x Per Week/30 Days Discharge Instructions: Gently cleanse wound with antibacterial soap, rinse and pat dry prior to dressing wounds Topical: Santyl Collagenase Ointment, 30 (gm), tube 3 x Per Week/30 Days Discharge Instructions: apply nickel thick to wound bed only Prim Dressing: Gauze 3 x Per  Week/30 Days ary Discharge Instructions: As directed: dry, moistened with saline or moistened with Dakins Solution Secondary Dressing: ABD Pad 5x9 (in/in) (Generic) 3 x Per Week/30 Days Discharge Instructions: Cover with ABD pad Secured With: Medipore T - 19M Medipore H Soft Cloth Surgical T ape ape, 2x2 (in/yd) (Generic) 3 x Per Week/30 Days Secured With: [employer] Sterile or Non-Sterile 6-ply 4.5x4 (yd/yd) (Generic) 3 x Per Week/30 Days Discharge Instructions: Apply Kerlix as directed Secured With: Tubigrip Size E, 3.5x10 (in/yds) 3 x Per Week/30 Days Discharge Instructions: Apply 3 Tubigrip E 3-finger-widths below knee to base of toes to secure dressing and/or for swelling. PRANAY, PANAS D ([id number]) [id number] Page 11 of 12 1. I'm gonna recommend currently that the patient continue with a Hydrofera Blue to the surgical site as well as the plantar foot. 2. I'm also gonna recommend that he should continue with the santyl with saline mositened gauze to follow on the lateral foot. 3. I would recommend that he continue to use the postop shoe for the time. I will see the patient back for a visit in one weeks time to see where things stand. Electronic Signature(s) Signed: 09/10/2023 3:46:23 PM By: Allen Derry PA-C Entered By: Allen Derry on 09/10/2023 12:06:44 -------------------------------------------------------------------------------- SuperBill Details Patient Name: Date of Service: [name], HO [place] RD D. 09/10/2023 Medical Record Number: [id number] Patient Account Number: [id number] Date of Birth/Sex: Treating RN: 1941/08/14 (83 y.o. [name] Primary Care Provider: Debbra Riding Other Clinician: Referring Provider: Treating Provider/Extender: Jasmine Pang in Treatment: 11 Diagnosis Coding ICD-10 Codes Code Description T81.31XA Disruption of external operation (surgical) wound, not elsewhere classified, initial  encounter E11.621 Type 2 diabetes mellitus with foot ulcer L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 L97.513 Non-pressure chronic ulcer of other part of right foot with necrosis of muscle I10 Essential (primary) hypertension 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 Z79.01 Long term (current) use of anticoagulants 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, stage 3 unspecified Facility Procedures : CPT4 Code: 78295621 Description: 11042 - DEB SUBQ TISSUE 20 SQ CM/< ICD-10 Diagnosis Description L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 Modifier: Quantity: 1 : CPT4 Code: 30865784 Description: 11043 - DEB MUSC/FASCIA 20 SQ CM/<  ICD-10 Diagnosis Description L97.513 Non-pressure chronic ulcer of other part of right foot with necrosis of muscle Modifier: Quantity: 1 Physician Procedures : CPT4 Code Description Modifier 11042 11042 - WC PHYS SUBQ TISS 20 SQ CM ICD-10 Diagnosis Description L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 DEAKON, SWINDELL D ([id number])  [id number] 11043 - WC PHYS DEBR MUSCLE/FASCIA 20 SQ CM 1 ICD-10 Diagnosis Description L97.513 Non-pressure chronic ulcer of other part of right foot with necrosis of muscle Quantity: 1 pdf Page 12 of 12 Electronic Signature(s) Signed: 09/10/2023 3:46:23 PM By: Allen Derry PA-C Entered By: Allen Derry on 09/10/2023 12:06:08

## 2023-09-24 ENCOUNTER — Encounter: Payer: Medicare Other | Attending: Physician Assistant | Admitting: Physician Assistant

## 2023-09-24 DIAGNOSIS — T8131XA Disruption of external operation (surgical) wound, not elsewhere classified, initial encounter: Secondary | ICD-10-CM | POA: Diagnosis not present

## 2023-09-24 DIAGNOSIS — L97513 Non-pressure chronic ulcer of other part of right foot with necrosis of muscle: Secondary | ICD-10-CM | POA: Insufficient documentation

## 2023-09-24 DIAGNOSIS — I509 Heart failure, unspecified: Secondary | ICD-10-CM | POA: Diagnosis not present

## 2023-09-24 DIAGNOSIS — E1151 Type 2 diabetes mellitus with diabetic peripheral angiopathy without gangrene: Secondary | ICD-10-CM | POA: Diagnosis not present

## 2023-09-24 DIAGNOSIS — N183 Chronic kidney disease, stage 3 unspecified: Secondary | ICD-10-CM | POA: Insufficient documentation

## 2023-09-24 DIAGNOSIS — Z7901 Long term (current) use of anticoagulants: Secondary | ICD-10-CM | POA: Insufficient documentation

## 2023-09-24 DIAGNOSIS — E11621 Type 2 diabetes mellitus with foot ulcer: Secondary | ICD-10-CM | POA: Diagnosis not present

## 2023-09-24 DIAGNOSIS — I2510 Atherosclerotic heart disease of native coronary artery without angina pectoris: Secondary | ICD-10-CM | POA: Diagnosis not present

## 2023-09-24 DIAGNOSIS — L97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 ulcer of other part of right foot with fat layer exposed: Secondary | ICD-10-CM | POA: Diagnosis present

## 2023-09-24 DIAGNOSIS — E1140 Type 2 diabetes mellitus with diabetic neuropathy, unspecified: Secondary | ICD-10-CM | POA: Diagnosis not present

## 2023-09-24 DIAGNOSIS — I130 Hypertensive heart and chronic kidney disease with heart failure and stage 1 through stage 4 chronic kidney disease, or unspecified chronic kidney disease: Secondary | ICD-10-CM | POA: Insufficient documentation

## 2023-09-24 DIAGNOSIS — E1122 Type 2 diabetes mellitus with diabetic chronic kidney disease: Secondary | ICD-10-CM | POA: Insufficient documentation

## 2023-09-24 DIAGNOSIS — X58XXXA Exposure to other specified factors, initial encounter: Secondary | ICD-10-CM | POA: Insufficient documentation

## 2023-09-24 NOTE — Progress Notes (Addendum)
 MOISHY, LADAY ([id number]) [id number] Page 1 of 13 Visit Report for 09/24/2023 Chief Complaint Document Details Patient Name: Date of Service: [name], [name] 09/24/2023 1:30 PM Medical Record Number: [id number] Patient Account Number: [id number] Date of Birth/Sex: Treating RN: Apr 21, 1941 (83 y.o. [name] Primary Care Provider: Cyrus [name] Other Clinician: Referring Provider: Treating Provider/Extender: [name] [name] [name] in Treatment: 13 Information Obtained from: Patient Chief Complaint Right foot ulcers Electronic Signature(s) Signed: 09/24/2023 1:36:20 PM By: Charles Andre PA-C Entered By: [name] on 09/24/2023 13:36:20 -------------------------------------------------------------------------------- Debridement Details Patient Name: Date of Service: [name], [name] [address] [name] 09/24/2023 1:30 PM Medical Record Number: [id number] Patient Account Number: [id number] Date of Birth/Sex: Treating RN: 1940-09-27 (83 y.o. 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 Performed By: Physician Charles Andre, PA-C The following information was scribed by: Claudene [name] The information was scribed for: [name] Debridement Type: Debridement Level of Consciousness (Pre-procedure): Awake and Alert Pre-procedure Verification/Time Out Yes - 14:31 Taken: Start Time: 14:31 Pain Control: Lidocaine  4% T opical Solution Percent of Wound Bed Debrided: 100% T Area Debrided (cm): otal 0.2 Tissue and other material debrided: Viable, Non-Viable, Slough, Subcutaneous, Slough Level: Skin/Subcutaneous Tissue Debridement Description: Excisional Instrument: Curette Bleeding: Minimum Hemostasis Achieved: Pressure [name], [name] ([id number])  [id number] Page 2 of 13 Procedural Pain: 0 Post Procedural Pain: 0 Response to Treatment: Procedure was tolerated well Level of Consciousness (Post- Awake and Alert procedure): Post Debridement Measurements of Total Wound Length: (cm) 0.5 Width: (cm) 0.5 Depth: (cm) 0.1 Volume: (cm) 0.02 Character of Wound/Ulcer Post Debridement: Stable Post Procedure Diagnosis Same as Pre-procedure Electronic Signature(s) Signed: 09/25/2023 5:32:36 PM By: Claudene Blossom MSN RN CNS WTA Signed: 09/25/2023 6:16:28 PM By: Charles Andre PA-C Entered By: Claudene [name] on 09/24/2023 14:31:51 -------------------------------------------------------------------------------- Debridement Details Patient Name: Date of Service: [name], [place] RD [name] 09/24/2023 1:30 PM Medical Record Number: [id number] Patient Account Number: [id number] Date of Birth/Sex: Treating RN: Apr 28, 1941 (83 y.o. [name] Primary Care Provider: Cyrus [name] Other Clinician: Referring Provider: Treating Provider/Extender: [name] [name] [name] in Treatment: 13 Debridement Performed for Assessment: Wound #2 Right,Plantar Foot Performed By: Physician Charles Andre, PA-C The following information was scribed by: Claudene [name] The information was scribed for: [name] Debridement Type: Debridement Severity of Tissue Pre Debridement: Fat layer exposed Level of Consciousness (Pre-procedure): Awake and Alert Pre-procedure Verification/Time Out Yes - 14:31 Taken: Start Time: 14:31 Pain Control: Lidocaine  4% T opical Solution Percent of Wound Bed Debrided: 100% T Area Debrided (cm): otal 0.2 Tissue and other material debrided: Viable, Non-Viable, Callus, Slough, Subcutaneous, Slough Level: Skin/Subcutaneous Tissue Debridement Description: Excisional Instrument: Curette Bleeding: Minimum Hemostasis Achieved: Pressure Procedural Pain: 0 Post Procedural Pain: 0 Response to Treatment: Procedure  was tolerated well Level of Consciousness (Post- Awake and Alert procedure): Post Debridement Measurements of Total Wound Length: (cm) 0.5 Width: (cm) 0.5 Depth: (cm) 0.1 Volume: (cm) 0.02 Character of Wound/Ulcer Post Debridement: Stable Severity of Tissue Post Debridement: Fat layer exposed [name], [name] ([id number]) [id number] Page 3 of 13 Post Procedure Diagnosis Same as Pre-procedure Electronic Signature(s) Signed: 09/25/2023 5:32:36 PM By: Claudene Blossom MSN RN CNS WTA Signed: 09/25/2023 6:16:28 PM By: Charles Andre PA-C Entered By: Claudene [name] on 09/24/2023 14:32:35 -------------------------------------------------------------------------------- Debridement Details Patient Name: Date of Service: [name], [place]  [name] 09/24/2023 1:30 PM Medical Record Number: [id number] Patient Account Number: [id number] Date of Birth/Sex: Treating RN: 1941-09-10 (83 y.o. [name] Primary Care Provider: Cyrus [name] Other Clinician: Referring Provider: Treating Provider/Extender: [name] [name] [name] in Treatment: 13 Debridement Performed for Assessment: Wound #5 Right,Distal,Posterior Foot Performed By: Physician Charles Andre, PA-C Debridement Type: Debridement Severity of Tissue Pre Debridement: Fat layer exposed Level of Consciousness (Pre-procedure): Awake and Alert Pre-procedure Verification/Time Out Yes - 14:31 Taken: Start Time: 14:31 Pain Control: Lidocaine  4% T opical Solution Percent of Wound Bed Debrided: 100% T Area Debrided (cm): otal 0.39 Tissue and other material debrided: Viable, Non-Viable, Slough, Biofilm, Slough Level: Non-Viable Tissue Debridement Description: Selective/Open Wound Instrument: Curette Bleeding: Minimum Hemostasis Achieved: Pressure Procedural Pain: 0 Post Procedural Pain: 0 Response to Treatment: Procedure was tolerated well Level of Consciousness (Post- Awake and Alert procedure): Post Debridement  Measurements of Total Wound Length: (cm) 1 Width: (cm) 0.5 Depth: (cm) 0.1 Volume: (cm) 0.039 Character of Wound/Ulcer Post Debridement: Stable Severity of Tissue Post Debridement: Fat layer exposed Post Procedure Diagnosis Same as Pre-procedure Electronic Signature(s) Signed: 09/25/2023 5:32:36 PM By: Claudene Blossom MSN RN CNS WTA Signed: 09/25/2023 6:16:28 PM By: Charles Andre PA-C Entered By: Claudene [name] on 09/24/2023 14:35:24 [name] ([id number]) [id number] Page 4 of 13 -------------------------------------------------------------------------------- Debridement Details Patient Name: Date of Service: [name], [name] 09/24/2023 1:30 PM Medical Record Number: [id number] Patient Account Number: [id number] Date of Birth/Sex: Treating RN: June 08, 1941 (83 y.o. [name] Primary Care Provider: Cyrus [name] Other Clinician: Referring Provider: Treating Provider/Extender: [name] [name] [name] in Treatment: 13 Debridement Performed for Assessment: Wound #3 Right,Lateral Foot Performed By: Physician Charles Andre, PA-C Debridement Type: Debridement Severity of Tissue Pre Debridement: Fat layer exposed Level of Consciousness (Pre-procedure): Awake and Alert Pre-procedure Verification/Time Out Yes - 14:31 Taken: Start Time: 14:31 Pain Control: Lidocaine  4% T opical Solution Percent of Wound Bed Debrided: 100% T Area Debrided (cm): otal 2.25 Tissue and other material debrided: Viable, Non-Viable, Muscle, Slough, Subcutaneous, Slough Level: Skin/Subcutaneous Tissue/Muscle Debridement Description: Excisional Instrument: Curette Bleeding: Minimum Hemostasis Achieved: Pressure Procedural Pain: 0 Post Procedural Pain: 0 Response to Treatment: Procedure was tolerated well Level of Consciousness (Post- Awake and Alert procedure): Post Debridement Measurements of Total Wound Length: (cm) 2.2 Width: (cm) 1.3 Depth: (cm) 0.5 Volume:  (cm) 1.123 Character of Wound/Ulcer Post Debridement: Stable Severity of Tissue Post Debridement: Fat layer exposed Post Procedure Diagnosis Same as Pre-procedure Electronic Signature(s) Signed: 09/25/2023 5:32:36 PM By: Claudene Blossom MSN RN CNS WTA Signed: 09/25/2023 6:16:28 PM By: Charles Andre PA-C Entered By: Claudene [name] on 09/24/2023 14:36:13 -------------------------------------------------------------------------------- HPI Details Patient Name: Date of Service: [name], [place] RD [name] 09/24/2023 1:30 PM Charles BARRIO BIRCH ([id number]) [id number] Page 5 of 13 Medical Record Number: [id number] Patient Account Number: [id number] Date of Birth/Sex: Treating RN: October 18, 1940 (83 y.o. [name] Primary Care Provider: Cyrus [name] Other Clinician: Referring Provider: Treating Provider/Extender: [name] [name] [name] in Treatment: 13 History of Present Illness Chronic/Inactive Conditions Condition 1: Notes from Vascular 05/21/23: Right below-the-knee popliteal artery to peroneal artery bypass with reverse saphenous vein graft. This was done on 04/11/2023. Noninvasive studies in our an ABI 0.61 on the right and 0.67 on the left. T oday arterial duplexes were done which shows biphasic/triphasic waveforms throughout the lower extremities down to the level of the tibial vessels. 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 This is suggestive that the patient's bypass is patent although it is not there is no noted occlusion on duplex 1. Atherosclerosis of native arteries of the extremities with ulceration (HCC) Based on noninvasive studies today appears that the patient would have possible marginal ability for wound healing. Concerned that some of the patient is usually a small vessel disease which may proved to have a little bit more difficult. Will  have the patient return in several weeks to see if there has been improvement. They are advised that if the wound deteriorates or begins to show signs of starting to contact us  for sooner follow-up. 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 With that being said that area is somewhat deep to be perfectly honest. It does have a little bit concerned. The remaining areas all came from the boot he was wearing and areas that rubbed they have gotten rid of the boot this will not happen again as far as that is concerned. With that being said the patient has had vascular intervention and the note is above as documented in the vascular note. With that being said it does appear that the patient has what is termed possible marginal ability for wound healing. I think this is basically something this can be quite difficult for us  to completely close. I am not seeing them possible but again there is a long road ahead. Patient does have a history of diabetes mellitus type 2, hypertension, peripheral vascular disease, coronary artery disease, long-term use of anticoagulants, chronic kidney disease stage III, and as of right now there is no direct evidence of continued and ongoing osteomyelitis although I do question whether or not there may be an issue here. 06-27-2023 upon evaluation today patient appears to be doing okay in regard to his wounds. He is showing signs of some improvement which is good news although this appears to be a little bit too moist in regard to the wounds on his feet in general. I do believe that he would benefit from a switching dressings to silver alginate away from the Xeroform at this point unfortunately. With that being said the original wound for which she was referred actually does appear to be doing okay  at this point in my opinion. I am actually somewhat pleased with where we stand in that regard. I do not see any evidence of active infection at this time which is good. I think the Dakin's moistened gauze is helping. 07-04-2023 upon evaluation today patient appears to be doing well currently in regard to his wounds all things considered. I do believe that he is making headway towards some closure and this is good news. Fortunately I do not see any evidence of active infection looking or systemically which is great news as well. No fevers, chills, nausea, vomiting, or diarrhea. 07-11-2023 upon evaluation today patient appears to be doing well currently in regard to his wound. He has primary wound at the amputation site is better and the other wounds are all showing signs of improvement as well the worst being the right lateral foot but even that seems better with the Betadine that we switch to last week. 07-18-2023 upon evaluation today patient appears to be doing well currently in regard to his wound. He has been tolerating the dressing changes  without complication. Fortunately I do not see any signs of active infection at this time. 07-25-2023 upon evaluation today patient appears to be doing well currently in regard to his wound. He has been taught routine the dressing changes and all 3 wounds look better especially the amputation site. Overall I am extremely pleased with where we stand currently. 08-01-2023 upon evaluation today patient appears to be doing well currently in regard to his wound. Has been tolerating the dressing changes without complication Iodoflex seems to be cleaning up the side wounds although this is very slow the medial looks better than lateral. With the big guard to the primary surgical site this looks much better. 08-08-2023 upon evaluation today patient appears to be doing pretty well currently in regard to the surgical wound although his other 2 wounds are not  doing nearly as well. I think we may need to make a switch here what we are doing I think of switching her over to Santyl. 08-20-2023 upon evaluation patient's wound bed actually showed signs of good granulation epithelization at this point. Fortunately I do not see any signs of infection at this time which is great news and in general I do feel like there were making really good headway here towards closure. All of his wounds appear to be doing better I think the Santyl is doing a good job. 09-04-2023 upon evaluation today patient appears to be doing well currently in regard to his wounds for the most part he is showing signs of improvement which is excellent. I do believe that the lateral foot wound is the worst out of all this and they all are gena require sharp debridement today. 09-10-23 upon evaluation today patient appears to be doing well currently in regard to his wounds. All are showing signs of improvement, although the right lateral foot wound is still the one lagging behind the most. There does not appear to be any evidence of obvious active infection at this point, which is good news. 09-24-23 upon evaluation today patient appears to be doing that well currently in regard to his wounds primarily although he has a new wound on his heel which is unfortunate. Fortunately I do not see any signs of infection which is good news. Nonetheless this does appear to be more of a potential for a friction injury more so even then pressure. With that being said I am also concerned about his blood flow unfortunately we try to get him back in the vascular the soonest they can get him Endo was in March. That is over 2 months away. Electronic Signature(s) Signed: 09/24/2023 5:26:33 PM By: Charles Ferraris PA-C Entered By: Charles Ferraris on 09/24/2023 17:26:33 [name], [name] ([id number]) [id number] Page 6 of  13 -------------------------------------------------------------------------------- Physical Exam Details Patient Name: Date of Service: [name], [name] 09/24/2023 1:30 PM Medical Record Number: [id number] Patient Account Number: [id number] Date of Birth/Sex: Treating RN: 25-Jul-1941 (83 y.o. [name] Primary Care Provider: Cyrus [name] Other Clinician: Referring Provider: Treating Provider/Extender: Charles Ferraris [name] [name] in Treatment: 13 Constitutional Well-nourished and well-hydrated in no acute distress. Respiratory normal breathing without difficulty. Psychiatric this patient is able to make decisions and demonstrates good insight into disease process. Alert and Oriented x 3. pleasant and cooperative. Notes Upon inspection patient's wound bed actually showed signs of [name] granulation epithelization in regard to the heel and the lateral foot although the plantar distal foot actually seems to be doing much better and the amputation site actually  showing signs of improvement although it still very slow. Electronic Signature(s) Signed: 09/24/2023 5:26:52 PM By: Charles Ferraris PA-C Entered By: Charles Ferraris on 09/24/2023 17:26:52 -------------------------------------------------------------------------------- Physician Orders Details Patient Name: Date of Service: [name], [place] RD [name] 09/24/2023 1:30 PM Medical Record Number: [id number] Patient Account Number: [id number] Date of Birth/Sex: Treating RN: 1940-10-16 (83 y.o. [name] Primary Care Provider: Cyrus [name] Other Clinician: Referring Provider: Treating Provider/Extender: Charles Ferraris [name] [name] in Treatment: 13 The following information was scribed by: Claudene [name] The information was scribed for: Charles Ferraris Verbal / Phone Orders: No Diagnosis Coding ICD-10 Coding Code Description T81.31XA Disruption of external operation (surgical) wound, not elsewhere classified, initial  encounter E11.621 Type 2 diabetes mellitus with foot ulcer L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 L97.513 Non-pressure chronic ulcer of other part of right foot with necrosis of muscle I10 Essential (primary) hypertension YVETTE, ROARK [name] ([id number]) [id number] Page 7 of 13 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 Z79.01 Long term (current) use of anticoagulants 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 Nurse Visit as needed [employer] wounds with antibacterial soap and water. May shower; gently cleanse wound with antibacterial soap, rinse and pat dry prior to dressing wounds Anesthetic (Use 'Patient Medications' Section for Anesthetic Order Entry) Lidocaine  applied to wound bed Edema Control - Orders / Instructions Tubigrip single layer applied. - E ABD and Kerlix for drainage on the leg Elevate, Exercise Daily and A void Standing for Long Periods of Time. Elevate legs to the level of the heart and pump ankles as often as possible Elevate leg(s) parallel to the floor when sitting. Wound Treatment Wound #1 - Amputation Site - Toe Wound Laterality: Right Cleanser: Byram Ancillary Kit - 15 Day Supply (Generic) 1 x Per Day/30 Days Discharge Instructions: Use supplies as instructed; Kit contains: (15) Saline Bullets; (15) 3x3 Gauze; 15 pr Gloves Prim Dressing: Gauze 1 x Per Day/30 Days ary Discharge Instructions: As directed: dry, moistened with saline or moistened with Dakins Solution Prim Dressing: Hydrofera Blue Ready Transfer Foam, 2.5x2.5 (in/in) 1 x Per Day/30 Days ary Discharge Instructions: Apply Hydrofera Blue Ready to wound bed as directed Secondary Dressing: Conforming Guaze Roll-Large (Generic) 1 x Per Day/30 Days Discharge  Instructions: Apply Conforming Stretch Guaze Bandage as directed Secured With: Medipore T - 35M Medipore H Soft Cloth Surgical T ape ape, 2x2 (in/yd) (Generic) 1 x Per Day/30 Days Wound #2 - Foot Wound Laterality: Plantar, Right Cleanser: Soap and Water 3 x Per Week/30 Days Discharge Instructions: Gently cleanse wound with antibacterial soap, rinse and pat dry prior to dressing wounds Prim Dressing: Gauze 3 x Per Week/30 Days ary Discharge Instructions: As directed: dry, moistened with saline or moistened with Dakins Solution Prim Dressing: Hydrofera Blue Ready Transfer Foam, 2.5x2.5 (in/in) 3 x Per Week/30 Days ary Discharge Instructions: Apply Hydrofera Blue Ready to wound bed as directed Secondary Dressing: ABD Pad 5x9 (in/in) (Generic) 3 x Per Week/30 Days Discharge Instructions: Cover with ABD pad Secured With: Medipore T - 35M Medipore H Soft Cloth Surgical T ape ape, 2x2 (in/yd) (Generic) 3 x Per Week/30 Days Secured With: [employer] Sterile or Non-Sterile 6-ply 4.5x4 (yd/yd) (Generic) 3 x Per Week/30 Days Discharge Instructions: Apply Kerlix as directed Secured With: Tubigrip Size E, 3.5x10 (in/yds) 3 x Per Week/30 Days Discharge Instructions: Apply 3  Tubigrip E 3-finger-widths below knee to base of toes to secure dressing and/or for swelling. Wound #3 - Foot Wound Laterality: Right, Lateral Cleanser: Soap and Water 3 x Per Week/30 Days Discharge Instructions: Gently cleanse wound with antibacterial soap, rinse and pat dry prior to dressing wounds Topical: Santyl Collagenase Ointment, 30 (gm), tube 3 x Per Week/30 Days Discharge Instructions: apply nickel thick to wound bed only Prim Dressing: Gauze 3 x Per Week/30 Days ary Discharge Instructions: As directed: dry, moistened with saline or moistened with Dakins Solution Secondary Dressing: ABD Pad 5x9 (in/in) (Generic) 3 x Per Week/30 Days Discharge Instructions: Cover with ABD pad Secured With: Medipore T - 51M Medipore H Soft  Cloth Surgical T ape ape, 2x2 (in/yd) (Generic) 3 x Per Week/30 Days Secured With: [employer] Sterile or Non-Sterile 6-ply 4.5x4 (yd/yd) (Generic) 3 x Per Week/30 Days Discharge Instructions: Apply Kerlix as directed KELDAN, EPLIN ([id number]) [id number] Page 8 of 13 Secured With: Tubigrip Size E, 3.5x10 (in/yds) 3 x Per Week/30 Days Discharge Instructions: Apply 3 Tubigrip E 3-finger-widths below knee to base of toes to secure dressing and/or for swelling. Wound #5 - Foot Wound Laterality: Right, Posterior, Distal Cleanser: Soap and Water 1 x Per Day/30 Days Discharge Instructions: Gently cleanse wound with antibacterial soap, rinse and pat dry prior to dressing wounds Cleanser: Vashe 5.8 (oz) 1 x Per Day/30 Days Discharge Instructions: Use vashe 5.8 (oz) as directed Topical: Santyl Collagenase Ointment, 30 (gm), tube 1 x Per Day/30 Days Discharge Instructions: apply nickel thick to wound bed only Prim Dressing: Hydrofera Blue Ready Transfer Foam, 2.5x2.5 (in/in) 1 x Per Day/30 Days ary Discharge Instructions: Apply Hydrofera Blue Ready to wound bed as directed Secondary Dressing: Kerlix 4.5 x 4.1 (in/yd) 1 x Per Day/30 Days Discharge Instructions: Apply Kerlix 4.5 x 4.1 (in/yd) as instructed Electronic Signature(s) Signed: 09/25/2023 5:32:36 PM By: Claudene Blossom MSN RN CNS WTA Signed: 09/25/2023 6:16:28 PM By: Charles Ferraris PA-C Entered By: Claudene [name] on 09/24/2023 15:25:18 -------------------------------------------------------------------------------- Problem List Details Patient Name: Date of Service: [name], [place] RD [name] 09/24/2023 1:30 PM Medical Record Number: [id number] Patient Account Number: [id number] Date of Birth/Sex: Treating RN: 07-Oct-1940 (83 y.o. 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 muscle 09/03/2023 No Yes I10 Essential (primary) hypertension 06/20/2023 No Yes FERNAND, SORBELLO ([id number]) [id number] Page 9 of 13 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 06/20/2023 No Yes I25.10 Atherosclerotic heart disease of native coronary artery without angina pectoris 06/20/2023 No Yes Z79.01 Long term (current) use of anticoagulants 06/20/2023 No Yes 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, stage 3 unspecified 06/20/2023 No Yes Inactive Problems Resolved Problems Electronic Signature(s) Signed: 09/25/2023 5:32:36 PM By: Claudene Blossom MSN RN CNS WTA Signed: 09/25/2023 6:16:28 PM By: Charles Ferraris PA-C Previous Signature: 09/24/2023 1:36:17 PM Version By: Charles Ferraris PA-C Entered By: Claudene [name] on 09/24/2023 14:42:07 -------------------------------------------------------------------------------- Progress Note Details Patient Name: Date of Service: [name], [place] RD [name] 09/24/2023 1:30 PM Medical Record Number: [id number] Patient Account Number: [id number] Date of Birth/Sex: Treating  RN: 19-Jan-1941 (83 y.o. [name] Primary Care Provider: Cyrus [name] Other Clinician: Referring Provider: Treating Provider/Extender: [name] [name] [name] in Treatment: 13 Subjective Chief Complaint Information obtained from Patient Right foot ulcers History of Present Illness (HPI) Chronic/Inactive Condition: Notes from Vascular 05/21/23: Right below-the-knee popliteal artery to peroneal artery bypass with reverse  saphenous vein graft. This was done on 04/11/2023. Noninvasive studies in our an ABI 0.61 on the right and 0.67 on the left. T oday arterial duplexes were done which shows biphasic/triphasic waveforms throughout the lower extremities down to the level of the tibial vessels. 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 This is suggestive that the patient's bypass is patent although it is not there is no noted occlusion on duplex 1. Atherosclerosis of native arteries of the extremities with ulceration (HCC) Based on noninvasive studies today appears that the patient would have possible marginal ability for wound healing. Concerned that some of the patient is usually a small vessel disease which may proved to have a little bit more difficult. Will have the patient return in several weeks to see if there has been improvement. They are advised that if the wound deteriorates or begins to show signs of starting to contact us  for sooner follow-up. 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 With that being said that area is somewhat deep to be perfectly honest. It does have a little bit concerned. The remaining areas all came from the boot he was wearing and areas that rubbed they have gotten rid of the boot this will not happen again as far as that is concerned. With that being said the patient has had vascular intervention and the note is above as documented in the vascular note. With that being said it does appear that the patient has what is termed possible marginal ability for wound healing. I think this is basically something this can be quite difficult for us  to completely close. I am not seeing them possible but again there is a  long road ahead. Patient does have a history of diabetes mellitus type 2, hypertension, peripheral vascular disease, coronary artery disease, long-term use of anticoagulants, chronic kidney disease stage III, and as of right now there is no direct evidence of continued and ongoing osteomyelitis although I do question whether or not there may be an issue here. [name], [name] ([id number]) [id number] Page 10 of 13 06-27-2023 upon evaluation today patient appears to be doing okay in regard to his wounds. He is showing signs of some improvement which is good news although this appears to be a little bit too moist in regard to the wounds on his feet in general. I do believe that he would benefit from a switching dressings to silver alginate away from the Xeroform at this point unfortunately. With that being said the original wound for which she was referred actually does appear to be doing okay at this point in my opinion. I am actually somewhat pleased with where we stand in that regard. I do not see any evidence of active infection at this time which is good. I think the Dakin's moistened gauze is helping. 07-04-2023 upon evaluation today patient appears to be doing well currently in regard to his wounds all things considered.  I do believe that he is making headway towards some closure and this is good news. Fortunately I do not see any evidence of active infection looking or systemically which is great news as well. No fevers, chills, nausea, vomiting, or diarrhea. 07-11-2023 upon evaluation today patient appears to be doing well currently in regard to his wound. He has primary wound at the amputation site is better and the other wounds are all showing signs of improvement as well the worst being the right lateral foot but even that seems better with the Betadine that we switch to last week. 07-18-2023 upon evaluation today patient appears to be doing well currently in  regard to his wound. He has been tolerating the dressing changes without complication. Fortunately I do not see any signs of active infection at this time. 07-25-2023 upon evaluation today patient appears to be doing well currently in regard to his wound. He has been taught routine the dressing changes and all 3 wounds look better especially the amputation site. Overall I am extremely pleased with where we stand currently. 08-01-2023 upon evaluation today patient appears to be doing well currently in regard to his wound. Has been tolerating the dressing changes without complication Iodoflex seems to be cleaning up the side wounds although this is very slow the medial looks better than lateral. With the big guard to the primary surgical site this looks much better. 08-08-2023 upon evaluation today patient appears to be doing pretty well currently in regard to the surgical wound although his other 2 wounds are not doing nearly as well. I think we may need to make a switch here what we are doing I think of switching her over to Santyl. 08-20-2023 upon evaluation patient's wound bed actually showed signs of good granulation epithelization at this point. Fortunately I do not see any signs of infection at this time which is great news and in general I do feel like there were making really good headway here towards closure. All of his wounds appear to be doing better I think the Santyl is doing a good job. 09-04-2023 upon evaluation today patient appears to be doing well currently in regard to his wounds for the most part he is showing signs of improvement which is excellent. I do believe that the lateral foot wound is the worst out of all this and they all are gena require sharp debridement today. 09-10-23 upon evaluation today patient appears to be doing well currently in regard to his wounds. All are showing signs of improvement, although the right lateral foot wound is still the one lagging behind the  most. There does not appear to be any evidence of obvious active infection at this point, which is good news. 09-24-23 upon evaluation today patient appears to be doing that well currently in regard to his wounds primarily although he has a new wound on his heel which is unfortunate. Fortunately I do not see any signs of infection which is good news. Nonetheless this does appear to be more of a potential for a friction injury more so even then pressure. With that being said I am also concerned about his blood flow unfortunately we try to get him back in the vascular the soonest they can get him Endo was in March. That is over 2 months away. Objective Constitutional Well-nourished and well-hydrated in no acute distress. Vitals Time Taken: 1:49 PM, Height: 68 in, Weight: 167 lbs, BMI: 25.4, Temperature: 98.4 F, Pulse: 80 bpm, Respiratory Rate: 18 breaths/min, Blood  Pressure: 171/78 mmHg. Respiratory normal breathing without difficulty. Psychiatric this patient is able to make decisions and demonstrates good insight into disease process. Alert and Oriented x 3. pleasant and cooperative. General Notes: Upon inspection patient's wound bed actually showed signs of [name] granulation epithelization in regard to the heel and the lateral foot although the plantar distal foot actually seems to be doing much better and the amputation site actually showing signs of improvement although it still very slow. Integumentary (Hair, Skin) Wound #1 status is Open. Original cause of wound was Surgical Injury. The date acquired was: 04/16/2023. The wound has been in treatment 13 weeks. The wound is located on the Right Amputation Site - T The wound measures 0.5cm length x 0.5cm width x 1cm depth; 0.196cm^2 area and 0.196cm^3 volume. oe. There is Fat Layer (Subcutaneous Tissue) exposed. There is a medium amount of serosanguineous drainage noted. There is medium (34-66%) pink granulation within the wound bed. Wound #2  status is Open. Original cause of wound was Footwear Injury. The date acquired was: 06/16/2023. The wound has been in treatment 13 weeks. The wound is located on the Right,Plantar Foot. The wound measures 0.5cm length x 0.5cm width x 0.1cm depth; 0.196cm^2 area and 0.02cm^3 volume. There is a medium amount of serosanguineous drainage noted. There is medium (34-66%) red, pink granulation within the wound bed. There is no necrotic tissue within the wound bed. Wound #3 status is Open. Original cause of wound was Footwear Injury. The date acquired was: 06/16/2023. The wound has been in treatment 13 weeks. The wound is located on the Right,Lateral Foot. The wound measures 2.2cm length x 1.3cm width x 0.5cm depth; 2.246cm^2 area and 1.123cm^3 volume. There is a medium amount of serosanguineous drainage noted. There is medium (34-66%) pink, pale granulation within the wound bed. There is no necrotic tissue within the wound bed. Wound #5 status is Open. Original cause of wound was Gradually Appeared. The date acquired was: 09/16/2023. The wound is located on the Right,Distal,Posterior Foot. The wound measures 1cm length x 0.5cm width x 0.1cm depth; 0.393cm^2 area and 0.039cm^3 volume. There is Fat Layer (Subcutaneous Tissue) exposed. There is no tunneling or undermining noted. There is a medium amount of serosanguineous drainage noted. Foul odor after cleansing was noted. There is small (1-33%) red, pink granulation within the wound bed. There is a medium (34-66%) amount of necrotic tissue within the wound bed including Adherent Slough. 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 There was a Excisional Skin/Subcutaneous Tissue oe Debridement with a total area of 0.2 sq cm performed by Charles Ferraris, PA-C. With the following instrument(s): Curette to remove Viable and Non-Viable tissue/material. Material removed includes Subcutaneous Tissue and Slough and after achieving pain control using Lidocaine  4% T opical Solution. No specimens were taken. A time out was conducted at 14:31, prior to the start of the procedure. A Minimum amount of bleeding was controlled  with Pressure. The procedure was tolerated well with a pain level of 0 throughout and a pain level of 0 following the procedure. Post Debridement Measurements: 0.5cm length x 0.5cm width x 0.1cm depth; 0.02cm^3 volume. Character of Wound/Ulcer Post Debridement is stable. Post procedure Diagnosis Wound #1: Same as Pre-Procedure Wound #2 Pre-procedure diagnosis of Wound #2 is a Diabetic Wound/Ulcer of the Lower Extremity located on the Right,Plantar Foot .Severity of Tissue Pre Debridement is: Fat layer exposed. There was a Excisional Skin/Subcutaneous Tissue Debridement with a total area of 0.2 sq cm performed by Charles Ferraris, PA-C. With the following instrument(s): Curette to remove Viable and Non-Viable tissue/material. Material removed includes Callus, Subcutaneous Tissue, and Slough after achieving pain control using Lidocaine  4% Topical Solution. No specimens were taken. A time out was conducted at 14:31, prior to the start of the procedure. A Minimum amount of bleeding was controlled with Pressure. The procedure was  tolerated well with a pain level of 0 throughout and a pain level of 0 following the procedure. Post Debridement Measurements: 0.5cm length x 0.5cm width x 0.1cm depth; 0.02cm^3 volume. Character of Wound/Ulcer Post Debridement is stable. Severity of Tissue Post Debridement is: Fat layer exposed. Post procedure Diagnosis Wound #2: Same as Pre-Procedure Wound #3 Pre-procedure diagnosis of Wound #3 is a Diabetic Wound/Ulcer of the Lower Extremity located on the Right,Lateral Foot .Severity of Tissue Pre Debridement is: Fat layer exposed. There was a Excisional Skin/Subcutaneous Tissue/Muscle Debridement with a total area of 2.25 sq cm performed by Charles Ferraris, PA-C. With the following instrument(s): Curette to remove Viable and Non-Viable tissue/material. Material removed includes Muscle, Subcutaneous Tissue, and Slough after achieving pain control using Lidocaine  4% Topical Solution. No specimens were taken. A time out was conducted at 14:31, prior to the start of the procedure. A Minimum amount of bleeding was controlled with Pressure. The procedure was tolerated well with a pain level of 0 throughout and a pain level of 0 following the procedure. Post Debridement Measurements: 2.2cm length x 1.3cm width x 0.5cm depth; 1.123cm^3 volume. Character of Wound/Ulcer Post Debridement is stable. Severity of Tissue Post Debridement is: Fat layer exposed. Post procedure Diagnosis Wound #3: Same as Pre-Procedure Wound #5 Pre-procedure diagnosis of Wound #5 is a Diabetic Wound/Ulcer of the Lower Extremity located on the Right,Distal,Posterior Foot .Severity of Tissue Pre Debridement is: Fat layer exposed. There was a Selective/Open Wound Non-Viable Tissue Debridement with a total area of 0.39 sq cm performed by Charles Ferraris, PA-C. With the following instrument(s): Curette to remove Viable and Non-Viable tissue/material. Material removed includes Slough and Biofilm and after achieving pain control using  Lidocaine  4% Topical Solution. No specimens were taken. A time out was conducted at 14:31, prior to the start of the procedure. A Minimum amount of bleeding was controlled with Pressure. The procedure was tolerated well with a pain level of 0 throughout and a pain level of 0 following the procedure. Post Debridement Measurements: 1cm length x 0.5cm width x 0.1cm depth; 0.039cm^3 volume. Character of Wound/Ulcer Post Debridement is stable. Severity of Tissue Post Debridement is: Fat layer exposed. Post procedure Diagnosis Wound #5: Same as Pre-Procedure Plan Follow-up Appointments: Return Appointment in 1 week. Nurse Visit as needed Bathing/ Shower/ Hygiene: Wash wounds with antibacterial soap and water. May shower; gently cleanse wound with antibacterial soap, rinse and pat dry prior to dressing wounds Anesthetic (Use 'Patient Medications' Section for Anesthetic Order Entry): Lidocaine  applied to wound bed Edema Control - Orders / Instructions: Tubigrip single layer  applied. - E ABD and Kerlix for drainage on the leg Elevate, Exercise Daily and Avoid Standing for Long Periods of Time. Elevate legs to the level of the heart and pump ankles as often as possible [name], [name] ([id number]) [id number] Page 12 of 13 Elevate leg(s) parallel to the floor when sitting. WOUND #1: - Amputation Site - T oe Wound Laterality: Right Cleanser: Byram Ancillary Kit - 15 Day Supply (Generic) 1 x Per Day/30 Days Discharge Instructions: Use supplies as instructed; Kit contains: (15) Saline Bullets; (15) 3x3 Gauze; 15 pr Gloves Prim Dressing: Gauze 1 x Per Day/30 Days ary Discharge Instructions: As directed: dry, moistened with saline or moistened with Dakins Solution Prim Dressing: Hydrofera Blue Ready Transfer Foam, 2.5x2.5 (in/in) 1 x Per Day/30 Days ary Discharge Instructions: Apply Hydrofera Blue Ready to wound bed as directed Secondary Dressing: Conforming Guaze  Roll-Large (Generic) 1 x Per Day/30 Days Discharge Instructions: Apply Conforming Stretch Guaze Bandage as directed Secured With: Medipore T - 42M Medipore H Soft Cloth Surgical T ape ape, 2x2 (in/yd) (Generic) 1 x Per Day/30 Days WOUND #2: - Foot Wound Laterality: Plantar, Right Cleanser: Soap and Water 3 x Per Week/30 Days Discharge Instructions: Gently cleanse wound with antibacterial soap, rinse and pat dry prior to dressing wounds Prim Dressing: Gauze 3 x Per Week/30 Days ary Discharge Instructions: As directed: dry, moistened with saline or moistened with Dakins Solution Prim Dressing: Hydrofera Blue Ready Transfer Foam, 2.5x2.5 (in/in) 3 x Per Week/30 Days ary Discharge Instructions: Apply Hydrofera Blue Ready to wound bed as directed Secondary Dressing: ABD Pad 5x9 (in/in) (Generic) 3 x Per Week/30 Days Discharge Instructions: Cover with ABD pad Secured With: Medipore T - 42M Medipore H Soft Cloth Surgical T ape ape, 2x2 (in/yd) (Generic) 3 x Per Week/30 Days Secured With: [employer] Sterile or Non-Sterile 6-ply 4.5x4 (yd/yd) (Generic) 3 x Per Week/30 Days Discharge Instructions: Apply Kerlix as directed Secured With: Tubigrip Size E, 3.5x10 (in/yds) 3 x Per Week/30 Days Discharge Instructions: Apply 3 Tubigrip E 3-finger-widths below knee to base of toes to secure dressing and/or for swelling. WOUND #3: - Foot Wound Laterality: Right, Lateral Cleanser: Soap and Water 3 x Per Week/30 Days Discharge Instructions: Gently cleanse wound with antibacterial soap, rinse and pat dry prior to dressing wounds Topical: Santyl Collagenase Ointment, 30 (gm), tube 3 x Per Week/30 Days Discharge Instructions: apply nickel thick to wound bed only Prim Dressing: Gauze 3 x Per Week/30 Days ary Discharge Instructions: As directed: dry, moistened with saline or moistened with Dakins Solution Secondary Dressing: ABD Pad 5x9 (in/in) (Generic) 3 x Per Week/30 Days Discharge Instructions: Cover with ABD  pad Secured With: Medipore T - 42M Medipore H Soft Cloth Surgical T ape ape, 2x2 (in/yd) (Generic) 3 x Per Week/30 Days Secured With: [employer] Sterile or Non-Sterile 6-ply 4.5x4 (yd/yd) (Generic) 3 x Per Week/30 Days Discharge Instructions: Apply Kerlix as directed Secured With: Tubigrip Size E, 3.5x10 (in/yds) 3 x Per Week/30 Days Discharge Instructions: Apply 3 Tubigrip E 3-finger-widths below knee to base of toes to secure dressing and/or for swelling. WOUND #5: - Foot Wound Laterality: Right, Posterior, Distal Cleanser: Soap and Water 1 x Per Day/30 Days Discharge Instructions: Gently cleanse wound with antibacterial soap, rinse and pat dry prior to dressing wounds Cleanser: Vashe 5.8 (oz) 1 x Per Day/30 Days Discharge Instructions: Use vashe 5.8 (oz) as directed Topical: Santyl Collagenase Ointment, 30 (gm), tube 1 x Per Day/30 Days Discharge Instructions: apply nickel thick to  wound bed only Prim Dressing: Hydrofera Blue Ready Transfer Foam, 2.5x2.5 (in/in) 1 x Per Day/30 Days ary Discharge Instructions: Apply Hydrofera Blue Ready to wound bed as directed Secondary Dressing: Kerlix 4.5 x 4.1 (in/yd) 1 x Per Day/30 Days Discharge Instructions: Apply Kerlix 4.5 x 4.1 (in/yd) as instructed 1. I would recommend based on what we are seeing that we have the patient going to continue to monitor for any signs of infection or worsening. Right now organ to continue with the Santyl for the lateral foot with saline moistened gauze to follow. 2. Recommend continue with the Hydrofera Blue to the amputation site as well as the distal foot plantar aspect. 3. I am also can recommend the patient should continue with Santyl and Hydrofera Blue to the heel which I think hopefully should help to clean this up and get moving in the right direction. 4. With regard to the referral to vascular unfortunately were having to give difficult time that the patient and they appear to be very busy. For that reason  I would go ahead and see about making a referral for the testing so we will just do the test first and then depending on the results of the test I may be able to talk to Dr. Jama and see if they can work him in sooner. We are gena go ahead and see about getting that done as quickly as possible for him the patient is in agreement with plan. We will see patient back for reevaluation in 1 week here in the clinic. If anything worsens or changes patient will contact our office for additional recommendations. Electronic Signature(s) Signed: 09/24/2023 5:28:01 PM By: Charles Ferraris PA-C Entered By: Charles Ferraris on 09/24/2023 17:28:01 [name] ([id number]) [id number] Page 13 of 13 -------------------------------------------------------------------------------- SuperBill Details Patient Name: Date of Service: [name], [name] 09/24/2023 Medical Record Number: [id number] Patient Account Number: [id number] Date of Birth/Sex: Treating RN: 1940/11/22 (83 y.o. [name] Primary Care Provider: Cyrus [name] Other Clinician: Referring Provider: Treating Provider/Extender: Charles Ferraris [name] [name] in Treatment: 13 Diagnosis Coding ICD-10 Codes Code Description T81.31XA Disruption of external operation (surgical) wound, not elsewhere classified, initial encounter E11.621 Type 2 diabetes mellitus with foot ulcer L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 L97.513 Non-pressure chronic ulcer of other part of right foot with necrosis of muscle I10 Essential (primary) hypertension 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 Z79.01 Long term (current) use of anticoagulants 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, stage 3 unspecified Facility Procedures : CPT4 Code: 63899987 Description: 11042 - DEB SUBQ TISSUE 20 SQ CM/< ICD-10  Diagnosis Description L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 Modifier: Quantity: 1 : CPT4 Code: 63899985 Description: 11043 - DEB MUSC/FASCIA 20 SQ CM/< ICD-10 Diagnosis Description L97.513 Non-pressure chronic ulcer of other part of right foot with necrosis of muscle Modifier: Quantity: 1 : CPT4 Code: [id number] Description: 97597 - DEBRIDE WOUND 1ST 20 SQ CM OR < ICD-10 Diagnosis Description L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 Modifier: Quantity: 1 Physician Procedures : CPT4 Code Description Modifier 11042 11042 - WC PHYS SUBQ TISS 20 SQ CM ICD-10 Diagnosis Description L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 Quantity: 1 : [id number] 11043 - WC PHYS DEBR MUSCLE/FASCIA 20 SQ CM ICD-10 Diagnosis Description L97.513  Non-pressure chronic ulcer of other part of right foot with necrosis of muscle Quantity: 1 : [id number] 97597 - WC PHYS DEBR WO ANESTH 20 SQ CM ICD-10 Diagnosis Description L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 Quantity: 1 Electronic Signature(s) Signed: 09/24/2023 5:28:34 PM By: Charles Ferraris PA-C Entered By: Charles Ferraris on 09/24/2023 17:28:33

## 2023-09-24 NOTE — Progress Notes (Addendum)
 [name] ([id number]) [id number] Page 1 of 13 Visit Report for 09/24/2023 Arrival Information Details Patient Name: Date of Service: [name], [name] 09/24/2023 1:30 PM Medical Record Number: [id number] Patient Account Number: [id number] Date of Birth/Sex: Treating RN: Dec 10, 1940 (83 y.o. NETTY Claudene Blossom Primary Care Carah Barrientes: Cyrus Mayo Other Clinician: Referring Brianca Fortenberry: Treating [name]/Extender: Charles Andre Cyrus Mayo Devra in [name]: 13 Visit Information History Since Last Visit Added or deleted any medications: No Patient Arrived: Wheel Chair Any new allergies or adverse reactions: No Arrival Time: 13:43 Had a fall or experienced change in No Accompanied By: daughter in law activities of daily living that may affect Transfer Assistance: None risk of falls: Patient Identification Verified: Yes Signs or symptoms of abuse/neglect since last visito No Secondary Verification Process Completed: Yes Hospitalized since last visit: No Patient Requires Transmission-Based Precautions: No Has Dressing in Place as Prescribed: Yes Patient Has Alerts: Yes Pain Present Now: No Patient Alerts: Patient on Blood Thinner diabetes type 2 Plavix  Electronic Signature(s) Signed: 09/25/2023 5:32:36 PM By: Claudene Blossom MSN RN CNS WTA Entered By: Claudene Blossom on 09/24/2023 13:43:58 -------------------------------------------------------------------------------- Clinic Level of Care Assessment Details Patient Name: Date of Service: [name] 09/24/2023 1:30 PM Medical Record Number: [id number] Patient Account Number: [id number] Date of Birth/Sex: Treating RN: 1941-06-06 (83 y.o. NETTY Claudene Blossom Primary Care Ermal Haberer: Cyrus Mayo Other Clinician: Referring Terrick Allred: Treating [name]/Extender: Charles Andre Cyrus Mayo Devra in [name]: 13 Clinic Level of Care Assessment Items TOOL 1 Quantity Score []  - 0 Use when EandM and  Procedure is performed on INITIAL visit ASSESSMENTS - Nursing Assessment / Reassessment []  - 0 General Physical Exam (combine w/ comprehensive assessment (listed just below) when performed on new pt. evals) []  - 0 Comprehensive Assessment (HX, ROS, Risk Assessments, Wounds Hx, etc.) ASSESSMENTS - Wound and Skin Assessment / Reassessment []  - 0 Dermatologic / Skin Assessment (not related to wound area) [name] ([id number]) [id number] Page 2 of 13 ASSESSMENTS - Ostomy and/or Continence Assessment and Care []  - 0 Incontinence Assessment and Management []  - 0 Ostomy Care Assessment and Management (repouching, etc.) PROCESS - Coordination of Care []  - 0 Simple Patient / Family Education for ongoing care []  - 0 Complex (extensive) Patient / Family Education for ongoing care []  - 0 Staff obtains Chiropractor, Records, T Results / Process Orders est []  - 0 Staff telephones HHA, Nursing Homes / Clarify orders / etc []  - 0 Routine Transfer to another Facility (non-emergent condition) []  - 0 Routine Hospital Admission (non-emergent condition) []  - 0 New Admissions / Manufacturing Engineer / Ordering NPWT Apligraf, etc. , []  - 0 Emergency Hospital Admission (emergent condition) PROCESS - Special Needs []  - 0 Pediatric / Minor Patient Management []  - 0 Isolation Patient Management []  - 0 Hearing / Language / Visual special needs []  - 0 Assessment of Community assistance (transportation, [name]/C planning, etc.) []  - 0 Additional assistance / Altered mentation []  - 0 Support Surface(s) Assessment (bed, cushion, seat, etc.) INTERVENTIONS - Miscellaneous []  - 0 External ear exam []  - 0 Patient Transfer (multiple staff / Nurse, Adult / Similar devices) []  - 0 Simple Staple / Suture removal (25 or less) []  - 0 Complex Staple / Suture removal (26 or more) []  - 0 Hypo/Hyperglycemic Management (do not check if billed separately) []  - 0 Ankle / Brachial  Index (ABI) - do not check if billed separately Has the patient been seen at the hospital within the last  three years: Yes Total Score: 0 Level Of Care: ____ Electronic Signature(s) Signed: 09/25/2023 5:32:36 PM By: Claudene Blossom MSN RN CNS WTA Entered By: Claudene Blossom on 09/24/2023 14:39:44 -------------------------------------------------------------------------------- Encounter Discharge Information Details Patient Name: Date of Service: [name], [place] [address] [name] 09/24/2023 1:30 PM Medical Record Number: [id number] Patient Account Number: [id number] Date of Birth/Sex: Treating RN: 1941/01/19 (83 y.o. NETTY Claudene Blossom Primary Care Virjean Boman: Cyrus Mayo Other Clinician: Referring Jerone Cudmore: Treating Dequincy Born/Extender: Charles Andre Cyrus Mayo Devra in [name]: [address] Encounter Discharge Information Items Post Procedure [name] ([id number]) [id number] Page 3 of 13 Discharge Condition: Stable Temperature (F): 98.4 Ambulatory Status: Cane Pulse (bpm): 80 Discharge Destination: Home Respiratory Rate (breaths/min): 18 Transportation: Private Auto Blood Pressure (mmHg): 171/78 Accompanied By: daughter in law Schedule Follow-up Appointment: Yes Clinical Summary of Care: Electronic Signature(s) Signed: 09/24/2023 3:26:52 PM By: Claudene Blossom MSN RN CNS WTA Entered By: Claudene Blossom on 09/24/2023 15:26:52 -------------------------------------------------------------------------------- Lower Extremity Assessment Details Patient Name: Date of Service: [name], [name] [address] [name] 09/24/2023 1:30 PM Medical Record Number: [id number] Patient Account Number: [id number] Date of Birth/Sex: Treating RN: 1941-07-07 (83 y.o. NETTY Claudene Blossom Primary Care Mckenzy Salazar: Cyrus Mayo Other Clinician: Referring Jermaine Tholl: Treating [name]/Extender: Charles Andre Cyrus Mayo Devra in [name]: 13 Electronic Signature(s) Signed: 09/25/2023 5:32:36 PM By: Claudene Blossom MSN RN  CNS WTA Entered By: Claudene Blossom on 09/24/2023 14:12:56 -------------------------------------------------------------------------------- Multi Wound Chart Details Patient Name: Date of Service: [name], HO [place] [address] [name] 09/24/2023 1:30 PM Medical Record Number: [id number] Patient Account Number: [id number] Date of Birth/Sex: Treating RN: July 19, 1941 (83 y.o. NETTY Claudene Blossom Primary Care Panagiota Perfetti: Cyrus Mayo Other Clinician: Referring Ayani Ospina: Treating Janira Mandell/Extender: Charles Andre Cyrus Mayo Devra in [name]: 13 Vital Signs Height(in): 68 Pulse(bpm): 80 Weight(lbs): 167 Blood Pressure(mmHg): 171/78 Body Mass Index(BMI): 25.4 Temperature(F): 98.41 Respiratory Rate(breaths/min): 18 [1:Photos:] [[id number] Page 4 of 13] Right Amputation Site - Toe Right, Plantar Foot Right, Lateral Foot Wound Location: Surgical Injury Footwear Injury Footwear Injury Wounding Event: Dehisced Wound Diabetic Wound/Ulcer of the Lower Diabetic Wound/Ulcer of the Lower Primary Etiology: Extremity Extremity Congestive Heart Failure, Congestive Heart Failure, Congestive Heart Failure, Comorbid History: Hypertension, Peripheral Venous Hypertension, Peripheral Venous Hypertension, Peripheral Venous Disease, Type II Diabetes, Disease, Type II Diabetes, Disease, Type II Diabetes, Neuropathy Neuropathy Neuropathy 04/16/2023 06/16/2023 06/16/2023 Date Acquired: 13 13 13  Weeks of [name]: Open Open Open Wound Status: No No No Wound Recurrence: 0.5x0.5x1 0.5x0.5x0.1 2.2x1.3x0.5 Measurements L x W x [name] (cm) 0.196 0.196 2.246 A (cm) : rea 0.196 0.02 1.123 Volume (cm) : 83.40% 49.10% -43.00% % Reduction in Area: 87.20% 47.40% -615.30% % Reduction in Volume: Full Thickness Without Exposed Grade 1 Grade 2 Classification: Support Structures Medium Medium Medium Exudate Amount: Serosanguineous Serosanguineous Serosanguineous Exudate Type: red, brown red, brown red,  brown Exudate Color: No No No Foul Odor A Cleansing: fter N/A N/A N/A Odor Anticipated Due to Product Use: Medium (34-66%) Medium (34-66%) Medium (34-66%) Granulation A mount: Pink Red, Pink Pink, Pale Granulation Quality: N/A None Present (0%) None Present (0%) Necrotic Amount: Fat Layer (Subcutaneous Tissue): Yes Fascia: No Fascia: No Exposed Structures: Fascia: No Fat Layer (Subcutaneous Tissue): No Fat Layer (Subcutaneous Tissue): No Tendon: No Tendon: No Tendon: No Muscle: No Muscle: No Muscle: No Joint: No Joint: No Joint: No Bone: No Bone: No Bone: No None Small (1-33%) None Epithelialization: Wound Number: 5 N/A N/A Photos: N/A N/A Right, Distal, Posterior Foot N/A N/A Wound Location: Gradually Appeared N/A N/A Wounding Event: Diabetic  Wound/Ulcer of the Lower N/A N/A Primary Etiology: Extremity Congestive Heart Failure, N/A N/A Comorbid History: Hypertension, Peripheral Venous Disease, Type II Diabetes, Neuropathy 09/16/2023 N/A N/A Date Acquired: 0 N/A N/A Weeks of [name]: Open N/A N/A Wound Status: No N/A N/A Wound Recurrence: 1x0.5x0.1 N/A N/A Measurements L x W x [name] (cm) 0.393 N/A N/A A (cm) : rea 0.039 N/A N/A Volume (cm) : N/A N/A N/A % Reduction in A rea: N/A N/A N/A % Reduction in Volume: Grade 3 N/A N/A Classification: Medium N/A N/A Exudate A mount: Serosanguineous N/A N/A Exudate Type: red, brown N/A N/A Exudate Color: Yes N/A N/A Foul Odor A Cleansing: fter No N/A N/A Odor A nticipated Due to Product Use: Small (1-33%) N/A N/A Granulation A mount: Red, Pink N/A N/A Granulation Quality: Medium (34-66%) N/A N/A Necrotic A mount: Fat Layer (Subcutaneous Tissue): Yes N/A N/A Exposed Structures: Fascia: No Tendon: No Muscle: No Joint: No [name] ([id number]) [id number] Page 5 of 13 Bone: No None N/A N/A Epithelialization: [name] Notes Electronic  Signature(s) Signed: 09/25/2023 5:32:36 PM By: Claudene Blossom MSN RN CNS WTA Entered By: Claudene Blossom on 09/24/2023 14:30:37 -------------------------------------------------------------------------------- Multi-Disciplinary Care Plan Details Patient Name: Date of Service: [name], [place] [address] [name] 09/24/2023 1:30 PM Medical Record Number: [id number] Patient Account Number: [id number] Date of Birth/Sex: Treating RN: 12-16-1940 (83 y.o. NETTY Claudene Blossom Primary Care Wyona Neils: Cyrus Mayo Other Clinician: Referring Etienne Mowers: Treating Shion Bluestein/Extender: Charles Andre Cyrus Mayo Devra in [name]: 13 Active Inactive Wound/Skin Impairment Nursing Diagnoses: Impaired tissue integrity Knowledge deficit related to ulceration/compromised skin integrity Goals: Patient/caregiver will verbalize understanding of skin care regimen Date Initiated: 06/20/2023 Date Inactivated: 07/18/2023 Target Resolution Date: 07/21/2023 Goal Status: Met Ulcer/skin breakdown will have a volume reduction of 30% by week 4 Date Initiated: 06/20/2023 Date Inactivated: 07/18/2023 Target Resolution Date: 07/21/2023 Goal Status: Met Ulcer/skin breakdown will have a volume reduction of 50% by week 8 Date Initiated: 06/20/2023 Date Inactivated: 08/08/2023 Target Resolution Date: 08/20/2023 Goal Status: Met Ulcer/skin breakdown will have a volume reduction of 80% by week 12 Date Initiated: 06/20/2023 Date Inactivated: 09/24/2023 Target Resolution Date: 09/20/2023 Goal Status: Met Ulcer/skin breakdown will heal within 14 weeks Date Initiated: 06/20/2023 Target Resolution Date: 10/04/2023 Goal Status: Active Interventions: Assess patient/caregiver ability to obtain necessary supplies Assess patient/caregiver ability to perform ulcer/skin care regimen upon admission and as needed Assess ulceration(s) every visit Provide education on ulcer and skin care [name] Activities: Referred to DME Liddie Chichester for dressing supplies :  06/20/2023 Skin care regimen initiated : 06/20/2023 Topical wound management initiated : 06/20/2023 Notes: Electronic Signature(s) Signed: 09/25/2023 5:32:36 PM By: Claudene Blossom MSN RN CNS WTA [place],Signed: 09/25/2023 5:32:36 PM By: Claudene Blossom MSN RN CNS ELIN KAYLA [name] ([id number]) [id number] Page 6 of 13 Entered By: Claudene Blossom on 09/24/2023 14:41:36 -------------------------------------------------------------------------------- Pain Assessment Details Patient Name: Date of Service: [name], [name] 09/24/2023 1:30 PM Medical Record Number: [id number] Patient Account Number: [id number] Date of Birth/Sex: Treating RN: [birth year] (83 y.o. NETTY Claudene Blossom Primary Care Jerome Otter: Cyrus Mayo Other Clinician: Referring Manuelita Moxon: Treating Dax Murguia/Extender: Charles Andre Cyrus Mayo Devra in [name]: 13 Active Problems Location of Pain Severity and Description of Pain Patient Has Paino Yes Site Locations Rate the pain. Current Pain Level: 2 Pain Management and Medication Current Pain Management: Electronic Signature(s) Signed: 09/25/2023 5:32:36 PM By: Claudene Blossom MSN RN CNS WTA Entered By: Claudene Blossom on 09/24/2023 13:51:57 -------------------------------------------------------------------------------- Patient/Caregiver Education Details Patient Name: Date of Service: MICHA [place], [place]  RD [name] 1/7/2025andnbsp1:30 PM Medical Record Number: [id number] Patient Account Number: [id number] Date of Birth/Gender: Treating RN: October 08, 1940 (83 y.o. [place], [place], [name] ([id number]) [id number] Page 7 of 13 Primary Care Physician: Cyrus Mayo Other Clinician: Referring Physician: Treating Physician/Extender: Charles Andre Cyrus Mayo Devra in [name]: 13 Education Assessment Education Provided To: Patient Education Topics Provided Wound Debridement: Handouts: Wound Debridement Methods:  Explain/Verbal Responses: State content correctly Wound/Skin Impairment: Handouts: Caring for Your Ulcer Methods: Explain/Verbal Responses: State content correctly Electronic Signature(s) Signed: 09/25/2023 5:32:36 PM By: Claudene Orie MSN RN CNS WTA Entered By: Claudene Orie on 09/24/2023 14:41:54 -------------------------------------------------------------------------------- Wound Assessment Details Patient Name: Date of Service: [name], [place] RD [name] 09/24/2023 1:30 PM Medical Record Number: [id number] Patient Account Number: [id number] Date of Birth/Sex: Treating RN: Nov 06, 1940 (83 y.o. NETTY Claudene Orie Primary Care Shawana Knoch: Cyrus Mayo Other Clinician: Referring Deneshia Zucker: Treating Ardit Danh/Extender: Charles Andre Cyrus Mayo Devra in [name]: 13 Wound Status Wound Number: 1 Primary Dehisced Wound Etiology: Wound Location: Right Amputation Site - Toe Wound Open Wounding Event: Surgical Injury Status: Date Acquired: 04/16/2023 Comorbid Congestive Heart Failure, Hypertension, Peripheral Venous Weeks Of [name]: 13 History: Disease, Type II Diabetes, Neuropathy Clustered Wound: No Photos Wound Measurements Length: (cm) 0.5 [name] ([id number]) Width: (cm) 0.5 Depth: (cm) 1 Area: (cm) 0.196 Volume: (cm) 0.196 % Reduction in Area: 83.4% [id number] Page 8 of 13 % Reduction in Volume: 87.2% Epithelialization: None Wound Description Classification: Full Thickness Without Exposed Support Structures Exudate Amount: Medium Exudate Type: Serosanguineous Exudate Color: red, brown Foul Odor After Cleansing: No Slough/Fibrino No Wound Bed Granulation Amount: Medium (34-66%) Exposed Structure Granulation Quality: Pink Fascia Exposed: No Fat Layer (Subcutaneous Tissue) Exposed: Yes Tendon Exposed: No Muscle Exposed: No Joint Exposed: No Bone Exposed: No [name] Notes Wound #1 (Amputation Site - Toe) Wound Laterality:  Right Cleanser Byram Ancillary Kit - 15 Day Supply Discharge Instruction: Use supplies as instructed; Kit contains: (15) Saline Bullets; (15) 3x3 Gauze; 15 pr Gloves Peri-Wound Care Topical Primary Dressing Gauze Discharge Instruction: As directed: dry, moistened with saline or moistened with Dakins Solution Hydrofera Blue Ready Transfer Foam, 2.5x2.5 (in/in) Discharge Instruction: Apply Hydrofera Blue Ready to wound bed as directed Secondary Dressing Conforming Guaze Roll-Large Discharge Instruction: Apply Conforming Stretch Guaze Bandage as directed Secured With Medipore T - 40M Medipore H Soft Cloth Surgical T ape ape, 2x2 (in/yd) Compression Wrap Compression Stockings Facilities Manager) Signed: 09/25/2023 5:32:36 PM By: Claudene Orie MSN RN CNS WTA Entered By: Claudene Orie on 09/24/2023 14:11:36 -------------------------------------------------------------------------------- Wound Assessment Details Patient Name: Date of Service: [name], [place] RD [name] 09/24/2023 1:30 PM Medical Record Number: [id number] Patient Account Number: [id number] Date of Birth/Sex: Treating RN: [birth year] (83 y.o. NETTY Claudene Orie Primary Care Lex Linhares: Cyrus Mayo Other Clinician: Referring Congetta Odriscoll: Treating Yuliet Needs/Extender: Charles Andre Cyrus Mayo [place], KAYLA [name] ([id number]) [id number] Page 9 of 13 Weeks in [name]: 13 Wound Status Wound Number: 2 Primary Diabetic Wound/Ulcer of the Lower Extremity Etiology: Wound Location: Right, Plantar Foot Wound Open Wounding Event: Footwear Injury Status: Date Acquired: 06/16/2023 Comorbid Congestive Heart Failure, Hypertension, Peripheral Venous Weeks Of [name]: 13 History: Disease, Type II Diabetes, Neuropathy Clustered Wound: No Photos Wound Measurements Length: (cm) 0.5 Width: (cm) 0.5 Depth: (cm) 0.1 Area: (cm) 0.196 Volume: (cm) 0.02 % Reduction in Area: 49.1% % Reduction in Volume:  47.4% Epithelialization: Small (1-33%) Wound Description Classification: Grade 1 Exudate Amount: Medium Exudate Type: Serosanguineous Exudate Color: red, brown Foul Odor After Cleansing: No Slough/Fibrino No  Wound Bed Granulation Amount: Medium (34-66%) Exposed Structure Granulation Quality: Red, Pink Fascia Exposed: No Necrotic Amount: None Present (0%) Fat Layer (Subcutaneous Tissue) Exposed: No Tendon Exposed: No Muscle Exposed: No Joint Exposed: No Bone Exposed: No [name] Notes Wound #2 (Foot) Wound Laterality: Plantar, Right Cleanser Soap and Water Discharge Instruction: Gently cleanse wound with antibacterial soap, rinse and pat dry prior to dressing wounds Peri-Wound Care Topical Primary Dressing Gauze Discharge Instruction: As directed: dry, moistened with saline or moistened with Dakins Solution Hydrofera Blue Ready Transfer Foam, 2.5x2.5 (in/in) Discharge Instruction: Apply Hydrofera Blue Ready to wound bed as directed Secondary Dressing ABD Pad 5x9 (in/in) Discharge Instruction: Cover with ABD pad Secured With Medipore T - 38M Medipore H Soft Cloth Surgical T ape ape, 2x2 (in/yd) Kerlix Roll Sterile or Non-Sterile 6-ply 4.5x4 (yd/yd) Discharge Instruction: Apply Kerlix as directed Tubigrip Size E, 3.5x10 (in/yds) AV[place], [name] ([id number]) [id number] Page 10 of 13 Discharge Instruction: Apply 3 Tubigrip E 3-finger-widths below knee to base of toes to secure dressing and/or for swelling. Compression Wrap Compression Stockings Add-Ons Electronic Signature(s) Signed: 09/25/2023 5:32:36 PM By: Claudene Blossom MSN RN CNS WTA Entered By: Claudene Blossom on 09/24/2023 14:11:57 -------------------------------------------------------------------------------- Wound Assessment Details Patient Name: Date of Service: [name], [name] [address] [name] 09/24/2023 1:30 PM Medical Record Number: [id number] Patient Account Number: [id number] Date of Birth/Sex:  Treating RN: 15-Apr-1941 (83 y.o. NETTY Claudene Blossom Primary Care Jackqulyn Mendel: Cyrus Mayo Other Clinician: Referring Gillian Kluever: Treating Adorian Gwynne/Extender: Charles Andre Cyrus Mayo Devra in [name]: 13 Wound Status Wound Number: 3 Primary Diabetic Wound/Ulcer of the Lower Extremity Etiology: Wound Location: Right, Lateral Foot Wound Open Wounding Event: Footwear Injury Status: Date Acquired: 06/16/2023 Comorbid Congestive Heart Failure, Hypertension, Peripheral Venous Weeks Of [name]: 13 History: Disease, Type II Diabetes, Neuropathy Clustered Wound: No Photos Wound Measurements Length: (cm) 2.2 Width: (cm) 1.3 Depth: (cm) 0.5 Area: (cm) 2.246 Volume: (cm) 1.123 % Reduction in Area: -43% % Reduction in Volume: -615.3% Epithelialization: None Wound Description Classification: Grade 2 Exudate Amount: Medium Exudate Type: Serosanguineous Exudate Color: red, brown Foul Odor After Cleansing: No Slough/Fibrino No Wound Bed Granulation Amount: Medium (34-66%) Exposed Structure Granulation Quality: Pink, Pale Fascia Exposed: No Necrotic Amount: None Present (0%) Fat Layer (Subcutaneous Tissue) Exposed: No Tendon Exposed: No [name] ([id number]) [id number] Page 11 of 13 Muscle Exposed: No Joint Exposed: No Bone Exposed: No [name] Notes Wound #3 (Foot) Wound Laterality: Right, Lateral Cleanser Soap and Water Discharge Instruction: Gently cleanse wound with antibacterial soap, rinse and pat dry prior to dressing wounds Peri-Wound Care Topical Santyl Collagenase Ointment, 30 (gm), tube Discharge Instruction: apply nickel thick to wound bed only Primary Dressing Gauze Discharge Instruction: As directed: dry, moistened with saline or moistened with Dakins Solution Secondary Dressing ABD Pad 5x9 (in/in) Discharge Instruction: Cover with ABD pad Secured With Medipore T - 38M Medipore H Soft Cloth Surgical T ape ape, 2x2  (in/yd) Kerlix Roll Sterile or Non-Sterile 6-ply 4.5x4 (yd/yd) Discharge Instruction: Apply Kerlix as directed Tubigrip Size E, 3.5x10 (in/yds) Discharge Instruction: Apply 3 Tubigrip E 3-finger-widths below knee to base of toes to secure dressing and/or for swelling. Compression Wrap Compression Stockings Add-Ons Electronic Signature(s) Signed: 09/25/2023 5:32:36 PM By: Claudene Blossom MSN RN CNS WTA Entered By: Claudene Blossom on 09/24/2023 14:12:29 -------------------------------------------------------------------------------- Wound Assessment Details Patient Name: Date of Service: [name], [name] 09/24/2023 1:30 PM Medical Record Number: [id number] Patient Account Number: [id number] Date of Birth/Sex: Treating RN: 1941/03/30 (83 y.o. NETTY Claudene Blossom  Primary Care Tashika Goodin: Cyrus Mayo Other Clinician: Referring Lille Karim: Treating Kenia Teagarden/Extender: Charles Andre Cyrus Mayo Devra in [name]: 13 Wound Status Wound Number: 5 Primary Diabetic Wound/Ulcer of the Lower Extremity Etiology: Wound Location: Right, Distal, Posterior Foot Wound Open Wounding Event: Gradually Appeared Status: Date Acquired: 09/16/2023 Comorbid Congestive Heart Failure, Hypertension, Peripheral Venous Weeks Of [name]: 0 History: Disease, Type II Diabetes, Neuropathy Clustered Wound: No Photos [name] ([id number]) [id number] Page 12 of 13 Wound Measurements Length: (cm) 1 Width: (cm) 0.5 Depth: (cm) 0.1 Area: (cm) 0.393 Volume: (cm) 0.039 % Reduction in Area: % Reduction in Volume: Epithelialization: None Tunneling: No Undermining: No Wound Description Classification: Grade 3 Exudate Amount: Medium Exudate Type: Serosanguineous Exudate Color: red, brown Foul Odor After Cleansing: Yes Due to Product Use: No Slough/Fibrino Yes Wound Bed Granulation Amount: Small (1-33%) Exposed Structure Granulation Quality: Red, Pink Fascia Exposed:  No Necrotic Amount: Medium (34-66%) Fat Layer (Subcutaneous Tissue) Exposed: Yes Necrotic Quality: Adherent Slough Tendon Exposed: No Muscle Exposed: No Joint Exposed: No Bone Exposed: No [name] Notes Wound #5 (Foot) Wound Laterality: Right, Posterior, Distal Cleanser Soap and Water Discharge Instruction: Gently cleanse wound with antibacterial soap, rinse and pat dry prior to dressing wounds Vashe 5.8 (oz) Discharge Instruction: Use vashe 5.8 (oz) as directed Peri-Wound Care Topical Santyl Collagenase Ointment, 30 (gm), tube Discharge Instruction: apply nickel thick to wound bed only Primary Dressing Hydrofera Blue Ready Transfer Foam, 2.5x2.5 (in/in) Discharge Instruction: Apply Hydrofera Blue Ready to wound bed as directed Secondary Dressing Kerlix 4.5 x 4.1 (in/yd) Discharge Instruction: Apply Kerlix 4.5 x 4.1 (in/yd) as instructed Secured With Compression Wrap Compression Stockings Add-Ons Electronic Signature(s) Signed: 09/25/2023 5:32:36 PM By: Claudene Blossom MSN RN CNS WTA Entered By: Claudene Blossom on 09/24/2023 14:08:58 [name] ([id number]) [id number] Page 13 of 13 -------------------------------------------------------------------------------- Vitals Details Patient Name: Date of Service: [name], [name] 09/24/2023 1:30 PM Medical Record Number: [id number] Patient Account Number: [id number] Date of Birth/Sex: Treating RN: 1941/06/17 (83 y.o. NETTY Claudene Blossom Primary Care Mykela Mewborn: Cyrus Mayo Other Clinician: Referring Salaya Holtrop: Treating Jonika Critz/Extender: Charles Andre Cyrus Mayo Devra in [name]: 13 Vital Signs Time Taken: 13:49 Temperature (F): 98.4 Height (in): 68 Pulse (bpm): 80 Weight (lbs): 167 Respiratory Rate (breaths/min): 18 Body Mass Index (BMI): 25.4 Blood Pressure (mmHg): 171/78 Reference Range: 80 - 120 mg / dl Electronic Signature(s) Signed: 09/24/2023 3:26:30 PM By: Claudene Blossom MSN RN CNS  WTA Entered By: Claudene Blossom on 09/24/2023 15:26:30

## 2023-10-02 ENCOUNTER — Encounter: Payer: Medicare Other | Admitting: Physician Assistant

## 2023-10-02 DIAGNOSIS — E11621 Type 2 diabetes mellitus with foot ulcer: Secondary | ICD-10-CM | POA: Diagnosis not present

## 2023-10-03 NOTE — Progress Notes (Signed)
Charles, CATTANACH [name] ([id number]) [id number] Page 1 of 12 Visit Report for 10/02/2023 Arrival Information Details Patient Name: Date of Service: [name], [name] 10/02/2023 8:30 A M Medical Record Number: [id number] Patient Account Number: [id number] Date of Birth/Sex: Treating RN: 05/05/1941 (83 y.o. [name] Primary Care [name]: Debbra Riding Other Clinician: Referring Tykeshia Tourangeau: Treating Verginia Toohey/Extender: Jasmine Pang in Treatment: 14 Visit Information History Since Last Visit All ordered tests and consults were completed: No Patient Arrived: Wheel Chair Added or deleted any medications: No Arrival Time: 08:39 Any new allergies or adverse reactions: No Accompanied By: caregiver Had a fall or experienced change in No Transfer Assistance: None activities of daily living that may affect Patient Identification Verified: Yes risk of falls: Secondary Verification Process Completed: Yes Hospitalized since last visit: No Patient Requires Transmission-Based Precautions: No Implantable device outside of the clinic excluding No Patient Has Alerts: Yes cellular tissue based products placed in the center Patient Alerts: Patient on Blood Thinner since last visit: diabetes type 2 Has Dressing in Place as Prescribed: Yes Plavix Pain Present Now: No Electronic Signature(s) Signed: 10/02/2023 4:06:08 PM By: Bonnell Public Entered By: Bonnell Public on 10/02/2023 09:44:18 -------------------------------------------------------------------------------- Encounter Discharge Information Details Patient Name: Date of Service: [name], [place] [address] [name] 10/02/2023 8:30 A M Medical Record Number: [id number] Patient Account Number: [id number] Date of Birth/Sex: Treating RN: Sep 24, 1940 (83 y.o. [name] Primary Care Breck [place]: Debbra Riding Other Clinician: Referring Chayna Surratt: Treating Wesam Gearhart/Extender: Jasmine Pang  in Treatment: 14 Encounter Discharge Information Items Post Procedure Vitals Discharge Condition: Stable Temperature (F): 97.8 Ambulatory Status: Wheelchair Pulse (bpm): 83 Discharge Destination: Home Respiratory Rate (breaths/min): 18 Transportation: Private Auto Blood Pressure (mmHg): 154/84 Accompanied By: caregiver Schedule Follow-up Appointment: Yes Clinical Summary of Care: Charles, [name] ([id number]) [id number] Page 2 of 12 Electronic Signature(s) Signed: 10/02/2023 4:06:08 PM By: Bonnell Public Entered By: Bonnell Public on 10/02/2023 09:46:12 -------------------------------------------------------------------------------- Lower Extremity Assessment Details Patient Name: Date of Service: [name], [name] 10/02/2023 8:30 A M Medical Record Number: [id number] Patient Account Number: [id number] Date of Birth/Sex: Treating RN: [birth year] (83 y.o. [name] Primary Care Cordarryl Monrreal: Debbra Riding Other Clinician: Referring Hamlet Lasecki: Treating Zanita Millman/Extender: Jasmine Pang in Treatment: 14 Edema Assessment Assessed: [Left: No] [Right: No] Edema: [Left: No] [Right: Yes] Calf Left: Right: Point of Measurement: 36 cm From Medial Instep 35 cm 41 cm Ankle Left: Right: Point of Measurement: 6 cm From Medial Instep 25 cm 29 cm Knee To Floor Left: Right: From Medial Instep 36 cm 36 cm Vascular Assessment Pulses: Dorsalis Pedis Doppler Audible: [Left:Yes] [Right:Yes] Posterior Tibial Doppler Audible: [Left:Yes] [Right:Yes] Popliteal Palpable: [Left:Yes] [Right:Yes] Extremity colors, hair growth, and conditions: Extremity Color: [Left:Normal] [Right:Hyperpigmented] Hair Growth on Extremity: [Left:No] [Right:No] Temperature of Extremity: [Left:Warm] [Right:Warm] Capillary Refill: [Left:< 3 seconds No] [Right:< 3 seconds No] Toe Nail Assessment Left: Right: Thick: Yes Yes Discolored: Yes Yes Deformed: Yes  Yes Improper Length and Hygiene: No Electronic Signature(s) Signed: 10/02/2023 4:06:08 PM By: Bonnell Public Entered By: Bonnell Public on 10/02/2023 09:13:58 Peter Congo [name] ([id number]) [id number] Page 3 of 12 -------------------------------------------------------------------------------- Multi Wound Chart Details Patient Name: Date of Service: [name], [name] 10/02/2023 8:30 A M Medical Record Number: [id number] Patient Account Number: [id number] Date of Birth/Sex: Treating RN: 30-Oct-1940 (83 y.o. [name] Primary Care Ofilia Rayon: Debbra Riding Other Clinician: Referring Jaely Silman: Treating Ed Rayson/Extender: Jasmine Pang in Treatment: 14 Vital  Signs Height(in): 68 Pulse(bpm): 83 Weight(lbs): 167 Blood Pressure(mmHg): 154/84 Body Mass Index(BMI): 25.4 Temperature(F): 97.8 Respiratory Rate(breaths/min): 16 [1:Photos:] Right Amputation Site - Toe Right, Plantar Foot Right, Lateral Foot Wound Location: Surgical Injury Footwear Injury Footwear Injury Wounding Event: Dehisced Wound Diabetic Wound/Ulcer of the Lower Diabetic Wound/Ulcer of the Lower Primary Etiology: Extremity Extremity Congestive Heart Failure, Congestive Heart Failure, Congestive Heart Failure, Comorbid History: Hypertension, Peripheral Venous Hypertension, Peripheral Venous Hypertension, Peripheral Venous Disease, Type II Diabetes, Disease, Type II Diabetes, Disease, Type II Diabetes, Neuropathy Neuropathy Neuropathy 04/16/2023 06/16/2023 06/16/2023 Date Acquired: 14 14 14  Weeks of Treatment: Open Open Open Wound Status: No No No Wound Recurrence: 0.5x0.5x1 0.4x0.4x0.1 2x1.4x0.3 Measurements L x W x [name] (cm) 0.196 0.126 2.199 A (cm) : rea 0.196 0.013 0.66 Volume (cm) : 83.40% 67.30% -40.00% % Reduction in Area: 87.20% 65.80% -320.40% % Reduction in Volume: Full Thickness Without Exposed Grade 1 Grade 2 Classification: Support  Structures Small Small Medium Exudate Amount: Serous Serous Serous Exudate Type: amber amber amber Exudate Color: No No Yes Foul Odor A Cleansing: fter N/A N/A No Odor Anticipated Due to Product Use: Large (67-100%) Medium (34-66%) None Present (0%) Granulation A mount: Red, Pink Red, Pink N/A Granulation Quality: None Present (0%) Small (1-33%) Large (67-100%) Necrotic Amount: Fat Layer (Subcutaneous Tissue): Yes Fat Layer (Subcutaneous Tissue): Yes Fascia: Yes Exposed Structures: Fascia: No Fascia: No Fat Layer (Subcutaneous Tissue): Yes Tendon: No Tendon: No Muscle: Yes Muscle: No Muscle: No Tendon: No Joint: No Joint: No Joint: No Bone: No Bone: No Bone: No None Small (1-33%) None Epithelialization: N/A N/A palpable bone through slough Assessment Notes: Wound Number: 5 N/A N/A Photos: N/A HUY, KRIES [name] ([id number]) [id number] Page 4 of 12 Right, Distal, Posterior Foot N/A N/A Wound Location: Gradually Appeared N/A N/A Wounding Event: Diabetic Wound/Ulcer of the Lower N/A N/A Primary Etiology: Extremity Congestive Heart Failure, N/A N/A Comorbid History: Hypertension, Peripheral Venous Disease, Type II Diabetes, Neuropathy 09/16/2023 N/A N/A Date Acquired: 1 N/A N/A Weeks of Treatment: Open N/A N/A Wound Status: No N/A N/A Wound Recurrence: 1x1x0.2 N/A N/A Measurements L x W x [name] (cm) 0.785 N/A N/A A (cm) : rea 0.157 N/A N/A Volume (cm) : -99.70% N/A N/A % Reduction in A rea: -302.60% N/A N/A % Reduction in Volume: Grade 3 N/A N/A Classification: Medium N/A N/A Exudate A mount: Serosanguineous N/A N/A Exudate Type: red, brown N/A N/A Exudate Color: No N/A N/A Foul Odor A Cleansing: fter N/A N/A N/A Odor A nticipated Due to Product Use: Medium (34-66%) N/A N/A Granulation A mount: Red, Pink N/A N/A Granulation Quality: Small (1-33%) N/A N/A Necrotic A mount: Fat Layer (Subcutaneous  Tissue): Yes N/A N/A Exposed Structures: Fascia: No Tendon: No Muscle: No Joint: No Bone: No None N/A N/A Epithelialization: N/A N/A N/A Assessment Notes: Treatment Notes Electronic Signature(s) Signed: 10/02/2023 4:06:08 PM By: Bonnell Public Entered By: Bonnell Public on 10/02/2023 09:14:03 -------------------------------------------------------------------------------- Multi-Disciplinary Care Plan Details Patient Name: Date of Service: [name], [place] [address] [name] 10/02/2023 8:30 A M Medical Record Number: [id number] Patient Account Number: [id number] Date of Birth/Sex: Treating RN: 05/18/1941 (83 y.o. [name] Primary Care Analina Filla: Debbra Riding Other Clinician: Referring Chi Woodham: Treating Donna Silverman/Extender: Jasmine Pang in Treatment: 14 Active Inactive Electronic Signature(s) Signed: 10/02/2023 4:06:08 PM By: Bonnell Public Demery,Signed: 10/02/2023 4:06:08 PM By: Valetta Mole [name] ([id number]) [id number] Page 5 of 12 Entered By: Bonnell Public on 10/02/2023 09:23:47 -------------------------------------------------------------------------------- Pain Assessment Details Patient Name: Date  of Service: [name], [name] 10/02/2023 8:30 A M Medical Record Number: [id number] Patient Account Number: [id number] Date of Birth/Sex: Treating RN: 06-24-1941 (83 y.o. [name] Primary Care Ahaan Zobrist: Debbra Riding Other Clinician: Referring Cristin Szatkowski: Treating Meilyn Heindl/Extender: Jasmine Pang in Treatment: 14 Active Problems Location of Pain Severity and Description of Pain Patient Has Paino Yes Site Locations Pain Location: Pain in Ulcers With Dressing Change: No Duration of the Pain. Constant / Intermittento Intermittent Pain Management and Medication Current Pain Management: Electronic Signature(s) Signed: 10/02/2023 4:06:08 PM By: Bonnell Public Entered By: Bonnell Public on 10/02/2023  08:41:19 -------------------------------------------------------------------------------- Patient/Caregiver Education Details Patient Name: Date of Service: [name], [name] RD [name] 1/15/2025andnbsp8:30 A M Medical Record Number: [id number] Patient Account Number: [id number] Date of Birth/Gender: Treating RN: Mar 04, 1941 (83 y.o. [address] [place], [name] ([id number]) [id number] Page 6 of 12 Primary Care Physician: Debbra Riding Other Clinician: Referring Physician: Treating Physician/Extender: Jasmine Pang in Treatment: 14 Education Assessment Education Provided To: Patient and Caregiver Education Topics Provided Tissue Oxygenation: Handouts: Peripheral Arterial Disease and Related Ulcers, Other: will attempt to get sooner vascular appt Methods: Explain/Verbal Responses: State content correctly Wound Debridement: Handouts: Wound Debridement Methods: Explain/Verbal Responses: State content correctly Wound/Skin Impairment: Handouts: Caring for Your Ulcer Methods: Explain/Verbal Responses: State content correctly Electronic Signature(s) Signed: 10/02/2023 4:06:08 PM By: Bonnell Public Entered By: Bonnell Public on 10/02/2023 09:24:49 -------------------------------------------------------------------------------- Wound Assessment Details Patient Name: Date of Service: [name], [name] RD [name] 10/02/2023 8:30 A M Medical Record Number: [id number] Patient Account Number: [id number] Date of Birth/Sex: Treating RN: 05-Dec-1940 (83 y.o. [name] Primary Care Jermine Bibbee: Debbra Riding Other Clinician: Referring Madason Rauls: Treating Johaan Ryser/Extender: Jasmine Pang in Treatment: 14 Wound Status Wound Number: 1 Primary Dehisced Wound Etiology: Wound Location: Right Amputation Site - Toe Wound Open Wounding Event: Surgical Injury Status: Date Acquired: 04/16/2023 Comorbid Congestive Heart Failure,  Hypertension, Peripheral Venous Weeks Of Treatment: 14 History: Disease, Type II Diabetes, Neuropathy Clustered Wound: No Photos GASPARD, WILDER [name] ([id number]) [id number] Page 7 of 12 Wound Measurements Length: (cm) 0.5 Width: (cm) 0.5 Depth: (cm) 1 Area: (cm) 0.196 Volume: (cm) 0.196 % Reduction in Area: 83.4% % Reduction in Volume: 87.2% Epithelialization: None Wound Description Classification: Full Thickness Without Exposed Support Structures Exudate Amount: Small Exudate Type: Serous Exudate Color: amber Foul Odor After Cleansing: No Slough/Fibrino No Wound Bed Granulation Amount: Large (67-100%) Exposed Structure Granulation Quality: Red, Pink Fascia Exposed: No Necrotic Amount: None Present (0%) Fat Layer (Subcutaneous Tissue) Exposed: Yes Tendon Exposed: No Muscle Exposed: No Joint Exposed: No Bone Exposed: No Treatment Notes Wound #1 (Amputation Site - Toe) Wound Laterality: Right Cleanser Byram Ancillary Kit - 15 Day Supply Discharge Instruction: Use supplies as instructed; Kit contains: (15) Saline Bullets; (15) 3x3 Gauze; 15 pr Gloves Peri-Wound Care Topical Primary Dressing Gauze Discharge Instruction: As directed: dry, moistened with saline or moistened with Dakins Solution Hydrofera Blue Ready Transfer Foam, 2.5x2.5 (in/in) Discharge Instruction: Apply Hydrofera Blue Ready to wound bed as directed Secondary Dressing Conforming Guaze Roll-Large Discharge Instruction: Apply Conforming Stretch Guaze Bandage as directed Secured With Medipore T - 13M Medipore H Soft Cloth Surgical T ape ape, 2x2 (in/yd) Compression Wrap Compression Stockings Add-Ons Electronic Signature(s) Signed: 10/02/2023 4:06:08 PM By: Bonnell Public Entered By: Bonnell Public on 10/02/2023 09:11:19 Peter Congo [name] ([id number]) [id number] Page 8 of  12 -------------------------------------------------------------------------------- Wound Assessment Details Patient Name: Date of Service: [place], [name] RD  [name] 10/02/2023 8:30 A M Medical Record Number: [id number] Patient Account Number: [id number] Date of Birth/Sex: Treating RN: [birth year] (83 y.o. [name] Primary Care Fusako Tanabe: Debbra Riding Other Clinician: Referring Tameisha Covell: Treating Pernell Dikes/Extender: Jasmine Pang in Treatment: 14 Wound Status Wound Number: 2 Primary Diabetic Wound/Ulcer of the Lower Extremity Etiology: Wound Location: Right, Plantar Foot Wound Open Wounding Event: Footwear Injury Status: Date Acquired: 06/16/2023 Comorbid Congestive Heart Failure, Hypertension, Peripheral Venous Weeks Of Treatment: 14 History: Disease, Type II Diabetes, Neuropathy Clustered Wound: No Photos Wound Measurements Length: (cm) 0.4 Width: (cm) 0.4 Depth: (cm) 0.1 Area: (cm) 0.126 Volume: (cm) 0.013 % Reduction in Area: 67.3% % Reduction in Volume: 65.8% Epithelialization: Small (1-33%) Wound Description Classification: Grade 1 Exudate Amount: Small Exudate Type: Serous Exudate Color: amber Foul Odor After Cleansing: No Slough/Fibrino No Wound Bed Granulation Amount: Medium (34-66%) Exposed Structure Granulation Quality: Red, Pink Fascia Exposed: No Necrotic Amount: Small (1-33%) Fat Layer (Subcutaneous Tissue) Exposed: Yes Necrotic Quality: Adherent Slough Tendon Exposed: No Muscle Exposed: No Joint Exposed: No Bone Exposed: No Electronic Signature(s) Signed: 10/02/2023 4:06:08 PM By: Bonnell Public Entered By: Bonnell Public on 10/02/2023 09:11:37 Peter Congo [name] ([id number]) [id number] Page 9 of 12 -------------------------------------------------------------------------------- Wound Assessment Details Patient Name: Date of Service: [name], [name] 10/02/2023 8:30 A M Medical Record Number:  [id number] Patient Account Number: [id number] Date of Birth/Sex: Treating RN: September 29, 1940 (83 y.o. [name] Primary Care Keylen Uzelac: Debbra Riding Other Clinician: Referring Esker Dever: Treating Zebbie Ace/Extender: Jasmine Pang in Treatment: 14 Wound Status Wound Number: 3 Primary Diabetic Wound/Ulcer of the Lower Extremity Etiology: Wound Location: Right, Lateral Foot Wound Open Wounding Event: Footwear Injury Status: Date Acquired: 06/16/2023 Comorbid Congestive Heart Failure, Hypertension, Peripheral Venous Weeks Of Treatment: 14 History: Disease, Type II Diabetes, Neuropathy Clustered Wound: No Photos Wound Measurements Length: (cm) 2 Width: (cm) 1.4 Depth: (cm) 0.3 Area: (cm) 2.199 Volume: (cm) 0.66 % Reduction in Area: -40% % Reduction in Volume: -320.4% Epithelialization: None Wound Description Classification: Grade Exudate Amount: Medium Exudate Type: Serous Exudate Color: amber 2 Foul Odor After Cleansing: Yes Due to Product Use: No Slough/Fibrino Yes Wound Bed Granulation Amount: None Present (0%) Exposed Structure Necrotic Amount: Large (67-100%) Fascia Exposed: Yes Necrotic Quality: Adherent Slough Fat Layer (Subcutaneous Tissue) Exposed: Yes Tendon Exposed: No Muscle Exposed: Yes Necrosis of Muscle: Yes Joint Exposed: No Bone Exposed: No Assessment Notes palpable bone through slough Treatment Notes Wound #3 (Foot) Wound Laterality: Right, Lateral Cleanser JAQUAYLON, WHITLOCK [name] ([id number]) [id number] Page 10 of 12 Soap and Water Discharge Instruction: Gently cleanse wound with antibacterial soap, rinse and pat dry prior to dressing wounds Peri-Wound Care Topical Santyl Collagenase Ointment, 30 (gm), tube Discharge Instruction: apply nickel thick to wound bed only Primary Dressing Gauze Discharge Instruction: As directed: dry, moistened with saline or moistened with Dakins Solution Secondary  Dressing ABD Pad 5x9 (in/in) Discharge Instruction: Cover with ABD pad Secured With Medipore T - 40M Medipore H Soft Cloth Surgical T ape ape, 2x2 (in/yd) Kerlix Roll Sterile or Non-Sterile 6-ply 4.5x4 (yd/yd) Discharge Instruction: Apply Kerlix as directed Tubigrip Size E, 3.5x10 (in/yds) Discharge Instruction: Apply 3 Tubigrip E 3-finger-widths below knee to base of toes to secure dressing and/or for swelling. Compression Wrap Compression Stockings Add-Ons Electronic Signature(s) Signed: 10/02/2023 4:06:08 PM By: Bonnell Public Entered By: Bonnell Public on 10/02/2023 09:21:09 -------------------------------------------------------------------------------- Wound Assessment Details Patient Name: Date of Service: [name], [name] 10/02/2023 8:30 A M Medical Record  Number: [id number] Patient Account Number: [id number] Date of Birth/Sex: Treating RN: 03/18/1941 (83 y.o. [name] Primary Care Verlin Uher: Debbra Riding Other Clinician: Referring Renee Beale: Treating Andyn Sales/Extender: Jasmine Pang in Treatment: 14 Wound Status Wound Number: 5 Primary Diabetic Wound/Ulcer of the Lower Extremity Etiology: Wound Location: Right, Distal, Posterior Foot Wound Open Wounding Event: Gradually Appeared Status: Date Acquired: 09/16/2023 Comorbid Congestive Heart Failure, Hypertension, Peripheral Venous Weeks Of Treatment: 1 History: Disease, Type II Diabetes, Neuropathy Clustered Wound: No Photos GIB, AYLSWORTH [name] ([id number]) [id number] Page 11 of 12 Wound Measurements Length: (cm) 1 Width: (cm) 1 Depth: (cm) 0.2 Area: (cm) 0.785 Volume: (cm) 0.157 % Reduction in Area: -99.7% % Reduction in Volume: -302.6% Epithelialization: None Wound Description Classification: Grade 3 Exudate Amount: Medium Exudate Type: Serosanguineous Exudate Color: red, brown Foul Odor After Cleansing: No Slough/Fibrino Yes Wound Bed Granulation  Amount: Medium (34-66%) Exposed Structure Granulation Quality: Red, Pink Fascia Exposed: No Necrotic Amount: Small (1-33%) Fat Layer (Subcutaneous Tissue) Exposed: Yes Necrotic Quality: Adherent Slough Tendon Exposed: No Muscle Exposed: No Joint Exposed: No Bone Exposed: No Electronic Signature(s) Signed: 10/02/2023 4:06:08 PM By: Bonnell Public Entered By: Bonnell Public on 10/02/2023 09:12:21 -------------------------------------------------------------------------------- Vitals Details Patient Name: Date of Service: [name], [place] [name] 10/02/2023 8:30 A M Medical Record Number: [id number] Patient Account Number: [id number] Date of Birth/Sex: Treating RN: 28-Jul-1941 (83 y.o. [name] Primary Care Dahlia Nifong: Debbra Riding Other Clinician: Referring Aldea Avis: Treating Rania Prothero/Extender: Jasmine Pang in Treatment: 14 Vital Signs Time Taken: 08:39 Temperature (F): 97.8 Height (in): 68 Pulse (bpm): 83 Weight (lbs): 167 Respiratory Rate (breaths/min): 16 Body Mass Index (BMI): 25.4 Blood Pressure (mmHg): 154/84 Reference Range: 80 - 120 mg / dl Electronic Signature(s) Signed: 10/02/2023 4:06:08 PM By: Bonnell Public Entered By: Bonnell Public on 10/02/2023 08:40:55 Peter Congo [name] ([id number]) [id number] Page 12 of 12

## 2023-10-03 NOTE — Progress Notes (Addendum)
SRULY, BRESSI D ([id number]) [id number] Page 1 of 12 Visit Report for 10/02/2023 Chief Complaint Document Details Patient Name: Date of Service: [name], [place] RD D. 10/02/2023 8:30 A M Medical Record Number: [id number] Patient Account Number: [id number] Date of Birth/Sex: Treating RN: Sep 24, 1940 (83 y.o. Barnett Abu, Leah Primary Care Provider: Debbra Riding Other Clinician: Referring Provider: Treating Provider/Extender: Jasmine Pang in Treatment: 14 Information Obtained from: Patient Chief Complaint Right foot ulcers Electronic Signature(s) Signed: 10/02/2023 9:00:53 AM By: Allen Derry PA-C Entered By: Allen Derry on 10/02/2023 09:00:53 -------------------------------------------------------------------------------- Debridement Details Patient Name: Date of Service: [name], HO [place] RD D. 10/02/2023 8:30 A M Medical Record Number: [id number] Patient Account Number: [id number] Date of Birth/Sex: Treating RN: August 03, 1941 (83 y.o. Barnett Abu, Leah Primary Care Provider: Debbra Riding Other Clinician: Referring Provider: Treating Provider/Extender: Jasmine Pang in Treatment: 14 Debridement Performed for Assessment: Wound #2 Right,Plantar Foot Performed By: Physician Allen Derry, PA-C Debridement Type: Debridement Severity of Tissue Pre Debridement: Fat layer exposed Level of Consciousness (Pre-procedure): Awake and Alert Pre-procedure Verification/Time Out Yes - 09:15 Taken: Start Time: 09:15 Pain Control: Lidocaine 2% T opical Gel Percent of Wound Bed Debrided: 100% T Area Debrided (cm): otal 0.13 Tissue and other material debrided: Viable, Non-Viable, Slough, Subcutaneous, Slough Level: Skin/Subcutaneous Tissue Debridement Description: Excisional Instrument: Curette Bleeding: Minimum Hemostasis Achieved: Pressure End Time: 09:16 Procedural PainARJENIS, INDOVINA D ([id number])  [id number] Page 2 of 12 Post Procedural Pain: 0 Response to Treatment: Procedure was tolerated well Level of Consciousness (Post- Awake and Alert procedure): Post Debridement Measurements of Total Wound Length: (cm) 0.4 Width: (cm) 0.4 Depth: (cm) 0.1 Volume: (cm) 0.013 Character of Wound/Ulcer Post Debridement: Stable Severity of Tissue Post Debridement: Fat layer exposed Post Procedure Diagnosis Same as Pre-procedure Electronic Signature(s) Signed: 10/02/2023 4:06:08 PM By: Bonnell Public Signed: 10/03/2023 6:05:15 PM By: Allen Derry PA-C Entered By: Bonnell Public on 10/02/2023 09:17:47 -------------------------------------------------------------------------------- Debridement Details Patient Name: Date of Service: [name], HO [place] RD D. 10/02/2023 8:30 A M Medical Record Number: [id number] Patient Account Number: [id number] Date of Birth/Sex: Treating RN: 17-Jun-1941 (83 y.o. 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 Performed By: Physician Allen Derry, PA-C Debridement Type: Debridement Level of Consciousness (Pre-procedure): Awake and Alert Pre-procedure Verification/Time Out Yes - 09:15 Taken: Start Time: 09:15 Pain Control: Lidocaine 2% T opical Gel Percent of Wound Bed Debrided: 100% T Area Debrided (cm): otal 0.2 Tissue and other material debrided: Viable, Non-Viable, Slough, Subcutaneous, Biofilm, Slough Level: Skin/Subcutaneous Tissue Debridement Description: Excisional Instrument: Curette Bleeding: Minimum Hemostasis Achieved: Pressure End Time: 09:16 Procedural Pain: 0 Post Procedural Pain: 0 Response to Treatment: Procedure was tolerated well Level of Consciousness (Post- Awake and Alert procedure): Post Debridement  Measurements of Total Wound Length: (cm) 0.5 Width: (cm) 0.5 Depth: (cm) 1 Volume: (cm) 0.196 Character of Wound/Ulcer Post Debridement: Stable Post Procedure Diagnosis Same as ETHANAEL, MARIA D ([id number]) [id number] Page 3 of 12 Electronic Signature(s) Signed: 10/02/2023 4:06:08 PM By: Bonnell Public Signed: 10/03/2023 6:05:15 PM By: Allen Derry PA-C Entered By: Bonnell Public on 10/02/2023 09:18:19 -------------------------------------------------------------------------------- Debridement Details Patient Name: Date of Service: [name], HO [place] RD D. 10/02/2023 8:30 A M Medical Record Number: [id number] Patient Account Number: [id number] Date of Birth/Sex: Treating RN: April 30, 1941 (83 y.o. Barnett Abu, Leah Primary Care Provider: Debbra Riding  Other Clinician: Referring Provider: Treating Provider/Extender: Jasmine Pang in Treatment: 14 Debridement Performed for Assessment: Wound #3 Right,Lateral Foot Performed By: Physician Allen Derry, PA-C Debridement Type: Debridement Severity of Tissue Pre Debridement: Necrosis of muscle Level of Consciousness (Pre-procedure): Awake and Alert Pre-procedure Verification/Time Out Yes - 09:15 Taken: Start Time: 09:15 Pain Control: Lidocaine 2% Topical Gel Percent of Wound Bed Debrided: 100% T Area Debrided (cm): otal 2.2 Tissue and other material debrided: Non-Viable, Muscle, Slough, Subcutaneous, Tendon, Slough Level: Skin/Subcutaneous Tissue/Muscle Debridement Description: Excisional Instrument: Forceps, Scissors Bleeding: Minimum Hemostasis Achieved: Pressure End Time: 09:16 Procedural Pain: 0 Post Procedural Pain: 0 Response to Treatment: Procedure was tolerated well Level of Consciousness (Post- Awake and Alert procedure): Post Debridement Measurements of Total Wound Length: (cm) 2 Width: (cm) 1.4 Depth: (cm) 0.3 Volume: (cm) 0.66 Character of Wound/Ulcer Post  Debridement: Stable Severity of Tissue Post Debridement: Necrosis of muscle Post Procedure Diagnosis Same as Pre-procedure Electronic Signature(s) Signed: 10/02/2023 4:06:08 PM By: Bonnell Public Signed: 10/03/2023 6:05:15 PM By: Allen Derry PA-C Entered By: Bonnell Public on 10/02/2023 09:20:52 Peter Congo D ([id number]) [id number] Page 4 of 12 -------------------------------------------------------------------------------- HPI Details Patient Name: Date of Service: [name], [name] 10/02/2023 8:30 A M Medical Record Number: [id number] Patient Account Number: [id number] Date of Birth/Sex: Treating RN: 07/25/1941 (83 y.o. Barnett Abu, Leah Primary Care Provider: Debbra Riding Other Clinician: Referring Provider: Treating Provider/Extender: Jasmine Pang in Treatment: 14 History of Present Illness Chronic/Inactive Conditions Condition 1: Notes from Vascular 05/21/23: Right below-the-knee popliteal artery to peroneal artery bypass with reverse saphenous vein graft. This was done on 04/11/2023. Noninvasive studies in our an ABI 0.61 on the right and 0.67 on the left. T oday arterial duplexes were done which shows biphasic/triphasic waveforms throughout the lower extremities down to the level of the tibial vessels. 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 This is suggestive that the patient's bypass is patent although it is not there is no noted occlusion on duplex 1. Atherosclerosis of native arteries of the extremities with ulceration (HCC) Based on noninvasive studies today appears that the patient would have possible marginal ability for wound healing. Concerned that some of the patient is usually a small vessel disease which may proved to have a little bit more difficult. Will have the patient return in several weeks to see  if there has been improvement. They are advised that if the wound deteriorates or begins to show signs of starting to contact us for sooner follow-up. 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 With that being said that area is somewhat deep to be perfectly honest. It does have a little bit concerned. The remaining areas all came from the boot he was wearing and areas that rubbed they have gotten rid of the boot this will not happen again as far as that is concerned. With that being said the patient has had vascular intervention and the note is above as documented in the vascular note. With that being said it does appear that the patient has what is termed "possible marginal ability for wound healing". I think this is basically something this can be quite difficult for [place] to completely close. I am not seeing them possible  but again there is a long road ahead. Patient does have a history of diabetes mellitus type 2, hypertension, peripheral vascular disease, coronary artery disease, long-term use of anticoagulants, chronic kidney disease stage III, and as of right now there is no direct evidence of continued and ongoing osteomyelitis although I do question whether or not there may be an issue here. 10-01-2022 upon evaluation today patient appears to be doing excellent right now in regard to his right plantar foot wound as well as the right surgical amputation site wound at both are making signs of great improvement and are very pleased in that regard. With regard to the right lateral foot wound this is still showing a lot of necrotic tissue and again we may have to be down to bone. With regard to his wound on the right proximal plantar foot this has many still little bit concerned as far as the peripheral vascular status is  concerned. I discussed that with him last week and this week we will try to get him set up for a arterial study a little bit sooner we did send that order over but we have not heard back yet as far as that is concerned. I am in to see about get in touch with them today. Electronic Signature(s) Signed: 10/02/2023 6:02:16 PM By: Allen Derry PA-C Entered By: Allen Derry on 10/02/2023 18:02:16 -------------------------------------------------------------------------------- Physical Exam Details Patient Name: Date of Service: [name], [name] 10/02/2023 8:30 A M Medical Record Number: [id number] Patient Account Number: [id number] Date of Birth/Sex: Treating RN: [birth year] (83 y.o. Barnett Abu, Leah Primary Care Provider: Debbra Riding Other Clinician: Referring Provider: Treating Provider/Extender: Jasmine Pang in Treatment: [address], [place] D ([id number]) [id number] Page 5 of 12 Well-nourished and well-hydrated in no acute distress. Respiratory normal breathing without difficulty. Psychiatric this patient is able to make decisions and demonstrates good insight into disease process. Alert and Oriented x 3. pleasant and cooperative. Notes Upon inspection patient's wound bed actually showed signs of good granulation and epithelization at this point. Fortunately I do not see any evidence of worsening overall and I believe that the patient is making headway here towards closure as far as the surgical site as well as the distal plantar foot wound is concerned. With regard to the plantar proximal wound this is still very concerning to me as is the lateral foot wound especially. I think he could potentially be exhibiting signs of infection and again osteomyelitis is not out of the question we may need to look into this further. I did perform debridement in regard to the distal plantar wound, the surgical site, and the lateral foot  wound but not the proximal. Postdebridement all wound sites appear to be doing better. Electronic Signature(s) Signed: 10/02/2023 6:03:09 PM By: Allen Derry PA-C Entered By: Allen Derry on 10/02/2023 18:03:09 -------------------------------------------------------------------------------- Physician Orders Details Patient Name: Date of Service: [name], HO [place] RD D. 10/02/2023 8:30 A M Medical Record Number: [id number] Patient Account Number: [id number] Date of Birth/Sex: Treating RN: 1941/09/06 (83 y.o. Barnett Abu, Leah Primary Care Provider: Debbra Riding Other Clinician: Referring Provider: Treating Provider/Extender: Jasmine Pang in Treatment: [phone] Verbal / Phone Orders: No Diagnosis Coding ICD-10 Coding Code Description T81.31XA Disruption of external operation (surgical) wound, not elsewhere classified, initial encounter E11.621 Type 2 diabetes mellitus with foot ulcer L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 L97.513 Non-pressure chronic ulcer of other part of  right foot with necrosis of muscle I10 Essential (primary) hypertension 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 Z79.01 Long term (current) use of anticoagulants 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 Nurse Visit as needed [employer] wounds with antibacterial soap and water. May shower; gently cleanse wound with antibacterial soap, rinse and pat dry prior to dressing wounds Anesthetic (Use 'Patient Medications' Section for Anesthetic Order Entry) Lidocaine applied to wound bed Edema Control - Orders / Instructions Tubigrip single layer applied. - E ABD and Kerlix for drainage on the leg Elevate, Exercise Daily and A void Standing for Long Periods of Time. Elevate legs to the  level of the heart and pump ankles as often as possible Elevate leg(s) parallel to the floor when sitting. Wound Treatment CASE, LAMMERS D ([id number]) [id number] Page 6 of 12 Wound #1 - Amputation Site - Toe Wound Laterality: Right Cleanser: Byram Ancillary Kit - 15 Day Supply (Generic) 1 x Per Day/30 Days Discharge Instructions: Use supplies as instructed; Kit contains: (15) Saline Bullets; (15) 3x3 Gauze; 15 pr Gloves Prim Dressing: Gauze 1 x Per Day/30 Days ary Discharge Instructions: As directed: dry, moistened with saline or moistened with Dakins Solution Prim Dressing: Hydrofera Blue Ready Transfer Foam, 2.5x2.5 (in/in) 1 x Per Day/30 Days ary Discharge Instructions: Apply Hydrofera Blue Ready to wound bed as directed Secondary Dressing: Conforming Guaze Roll-Large (Generic) 1 x Per Day/30 Days Discharge Instructions: Apply Conforming Stretch Guaze Bandage as directed Secured With: Medipore T - 88M Medipore H Soft Cloth Surgical T ape ape, 2x2 (in/yd) (Generic) 1 x Per Day/30 Days Wound #2 - Foot Wound Laterality: Plantar, Right Cleanser: Soap and Water 3 x Per Week/30 Days Discharge Instructions: Gently cleanse wound with antibacterial soap, rinse and pat dry prior to dressing wounds Prim Dressing: Gauze 3 x Per Week/30 Days ary Discharge Instructions: As directed: dry, moistened with saline or moistened with Dakins Solution Prim Dressing: Hydrofera Blue Ready Transfer Foam, 2.5x2.5 (in/in) 3 x Per Week/30 Days ary Discharge Instructions: Apply Hydrofera Blue Ready to wound bed as directed Secondary Dressing: ABD Pad 5x9 (in/in) (Generic) 3 x Per Week/30 Days Discharge Instructions: Cover with ABD pad Secured With: Medipore T - 88M Medipore H Soft Cloth Surgical T ape ape, 2x2 (in/yd) (Generic) 3 x Per Week/30 Days Secured With: [employer] Sterile or Non-Sterile 6-ply 4.5x4 (yd/yd) (Generic) 3 x Per Week/30 Days Discharge Instructions: Apply Kerlix  as directed Secured With: Tubigrip Size E, 3.5x10 (in/yds) 3 x Per Week/30 Days Discharge Instructions: Apply 3 Tubigrip E 3-finger-widths below knee to base of toes to secure dressing and/or for swelling. Wound #3 - Foot Wound Laterality: Right, Lateral Cleanser: Soap and Water 3 x Per Week/30 Days Discharge Instructions: Gently cleanse wound with antibacterial soap, rinse and pat dry prior to dressing wounds Topical: Santyl Collagenase Ointment, 30 (gm), tube 3 x Per Week/30 Days Discharge Instructions: apply nickel thick to wound bed only Prim Dressing: Gauze 3 x Per Week/30 Days ary Discharge Instructions: As directed: dry, moistened with saline or moistened with Dakins Solution Secondary Dressing: ABD Pad 5x9 (in/in) (Generic) 3 x Per Week/30 Days Discharge Instructions: Cover with ABD pad Secured With: Medipore T - 88M Medipore H Soft Cloth Surgical T ape ape, 2x2 (in/yd) (Generic) 3 x Per Week/30 Days Secured With: [employer] Sterile or Non-Sterile 6-ply 4.5x4 (yd/yd) (Generic) 3 x Per Week/30 Days Discharge Instructions: Apply  Kerlix as directed Secured With: Tubigrip Size E, 3.5x10 (in/yds) 3 x Per Week/30 Days Discharge Instructions: Apply 3 Tubigrip E 3-finger-widths below knee to base of toes to secure dressing and/or for swelling. Wound #5 - Foot Wound Laterality: Right, Posterior, Distal Cleanser: Soap and Water 1 x Per Day/30 Days Discharge Instructions: Gently cleanse wound with antibacterial soap, rinse and pat dry prior to dressing wounds Cleanser: Vashe 5.8 (oz) 1 x Per Day/30 Days Discharge Instructions: Use vashe 5.8 (oz) as directed Topical: Santyl Collagenase Ointment, 30 (gm), tube 1 x Per Day/30 Days Discharge Instructions: apply nickel thick to wound bed only Prim Dressing: Hydrofera Blue Ready Transfer Foam, 2.5x2.5 (in/in) 1 x Per Day/30 Days ary Discharge Instructions: Apply Hydrofera Blue Ready to wound bed as directed Secondary Dressing: Kerlix 4.5 x 4.1  (in/yd) 1 x Per Day/30 Days Discharge Instructions: Apply Kerlix 4.5 x 4.1 (in/yd) as instructed JAMAS, SEIGLE D ([id number]) [id number] Page 7 of 12 Electronic Signature(s) Signed: 10/02/2023 4:06:08 PM By: Bonnell Public Signed: 10/03/2023 6:05:15 PM By: Allen Derry PA-C Entered By: Bonnell Public on 10/02/2023 09:23:10 -------------------------------------------------------------------------------- Problem List Details Patient Name: Date of Service: [name], HO [place] RD D. 10/02/2023 8:30 A M Medical Record Number: [id number] Patient Account Number: [id number] Date of Birth/Sex: Treating RN: [birth year] (83 y.o. Barnett Abu, Leah Primary Care Provider: Debbra Riding Other Clinician: Referring Provider: Treating Provider/Extender: Jasmine Pang in Treatment: 14 Active Problems ICD-10 Encounter Code Description Active Date MDM Diagnosis T81.31XA Disruption of external operation (surgical) wound, not elsewhere classified, 06/20/2023 No Yes initial encounter E11.621 Type 2 diabetes mellitus with foot ulcer 06/20/2023 No Yes L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 06/20/2023 No Yes L97.513 Non-pressure chronic ulcer of other part of right foot with necrosis of muscle 09/03/2023 No Yes I10 Essential (primary) hypertension 06/20/2023 No Yes 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 06/20/2023 No Yes I25.10 Atherosclerotic heart disease of native coronary artery without angina pectoris 06/20/2023 No Yes Z79.01 Long term (current) use of anticoagulants 06/20/2023 No Yes 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, stage 3 unspecified 06/20/2023 No Yes Inactive Problems Resolved Problems PRUDENCIO, KOLLER ([id number]) [id number] Page 8 of 12 Electronic Signature(s) Signed: 10/02/2023 9:00:47 AM By: Allen Derry PA-C Entered By: Allen Derry on 10/02/2023  09:00:47 -------------------------------------------------------------------------------- Progress Note Details Patient Name: Date of Service: [name], [name] 10/02/2023 8:30 A M Medical Record Number: [id number] Patient Account Number: [id number] Date of Birth/Sex: Treating RN: 1940/11/03 (83 y.o. Barnett Abu, Leah Primary Care Provider: Debbra Riding Other Clinician: Referring Provider: Treating Provider/Extender: Jasmine Pang in Treatment: 14 Subjective Chief Complaint Information obtained from Patient Right foot ulcers History of Present Illness (HPI) Chronic/Inactive Condition: Notes from Vascular 05/21/23: Right below-the-knee popliteal artery to peroneal artery bypass with reverse saphenous vein graft. This was done on 04/11/2023. Noninvasive studies in our an ABI 0.61 on the right and 0.67 on the left. T oday arterial duplexes were done which shows biphasic/triphasic waveforms throughout the lower extremities down to the level of the tibial vessels. 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 This is suggestive that the patient's bypass is patent although it is not there is no noted occlusion on duplex 1. Atherosclerosis of native arteries of the extremities with ulceration (HCC) Based on noninvasive studies today appears that the patient would have possible marginal ability for wound healing. Concerned  that some of the patient is usually a small vessel disease which may proved to have a little bit more difficult. Will have the patient return in several weeks to see if there has been improvement. They are advised that if the wound deteriorates or begins to show signs of starting to contact us for sooner follow-up. Upon evaluation today patient presents today for wounds over multiple locations which include several areas on the foot the original  wound is actually at surgery site which is the amputation of the second toe. With that being said that area is somewhat deep to be perfectly honest. It does have a little bit concerned. The remaining areas all came from the boot he was wearing and areas that rubbed they have gotten rid of the boot this will not happen again as far as that is concerned. With that being said the patient has had vascular intervention and the note is above as documented in the vascular note. With that being said it does appear that the patient has what is termed "possible marginal ability for wound healing". I think this is basically something this can be quite difficult for [place] to completely close. I am not seeing them possible but again there is a long road ahead. Patient does have a history of diabetes mellitus type 2, hypertension, peripheral vascular disease, coronary artery disease, long-term use of anticoagulants, chronic kidney disease stage III, and as of right now there is no direct evidence of continued and ongoing osteomyelitis although I do question whether or not there may be an issue here. 10-01-2022 upon evaluation today patient appears to be doing excellent right now in regard to his right plantar foot wound as well as the right surgical amputation site wound at both are making signs of great improvement and are very pleased in that regard. With regard to the right lateral foot wound this is still showing a lot of necrotic tissue and again we may have to be down to bone. With regard to his wound on the right proximal plantar foot this has many still little bit concerned as far as the peripheral vascular status is concerned. I discussed that with him last week and this week we will try to get him set up for a arterial study a little bit sooner we did send that order over but we have not heard back yet as far as that is concerned. I am in to see about get in touch with  them today. Objective Constitutional Well-nourished and well-hydrated in no acute distress. Vitals Time Taken: 8:39 AM, Height: 68 in, Weight: 167 lbs, BMI: 25.4, Temperature: 97.8 F, Pulse: 83 bpm, Respiratory Rate: 16 breaths/min, Blood Pressure: 154/84 mmHg. Respiratory normal breathing without difficulty. Psychiatric JERRIAN, BURGES ([id number]) [id number] Page 9 of 12 this patient is able to make decisions and demonstrates good insight into disease process. Alert and Oriented x 3. pleasant and cooperative. General Notes: Upon inspection patient's wound bed actually showed signs of good granulation and epithelization at this point. Fortunately I do not see any evidence of worsening overall and I believe that the patient is making headway here towards closure as far as the surgical site as well as the distal plantar foot wound is concerned. With regard to the plantar proximal wound this is still very concerning to me as is the lateral foot wound especially. I think he could potentially be exhibiting signs of infection and again osteomyelitis is not out of the question we may need to look  into this further. I did perform debridement in regard to the distal plantar wound, the surgical site, and the lateral foot wound but not the proximal. Postdebridement all wound sites appear to be doing better. Integumentary (Hair, Skin) Wound #1 status is Open. Original cause of wound was Surgical Injury. The date acquired was: 04/16/2023. The wound has been in treatment 14 weeks. The wound is located on the Right Amputation Site - T The wound measures 0.5cm length x 0.5cm width x 1cm depth; 0.196cm^2 area and 0.196cm^3 volume. oe. There is Fat Layer (Subcutaneous Tissue) exposed. There is a small amount of serous drainage noted. There is large (67-100%) red, pink granulation within the wound bed. There is no necrotic tissue within the wound bed. Wound #2 status is Open.  Original cause of wound was Footwear Injury. The date acquired was: 06/16/2023. The wound has been in treatment 14 weeks. The wound is located on the Right,Distal,Plantar Foot. The wound measures 0.4cm length x 0.4cm width x 0.1cm depth; 0.126cm^2 area and 0.013cm^3 volume. There is Fat Layer (Subcutaneous Tissue) exposed. There is a small amount of serous drainage noted. There is medium (34-66%) red, pink granulation within the wound bed. There is a small (1-33%) amount of necrotic tissue within the wound bed including Adherent Slough. Wound #3 status is Open. Original cause of wound was Footwear Injury. The date acquired was: 06/16/2023. The wound has been in treatment 14 weeks. The wound is located on the Right,Lateral Foot. The wound measures 2cm length x 1.4cm width x 0.3cm depth; 2.199cm^2 area and 0.66cm^3 volume. There is muscle, Fat Layer (Subcutaneous Tissue), and fascia exposed. There is a medium amount of serous drainage noted. Foul odor after cleansing was noted. There is no granulation within the wound bed. There is a large (67-100%) amount of necrotic tissue within the wound bed including Adherent Slough and Necrosis of Muscle. General Notes: palpable bone through slough Wound #5 status is Open. Original cause of wound was Gradually Appeared. The date acquired was: 09/16/2023. The wound has been in treatment 1 weeks. The wound is located on the Right,Proximal,Plantar Foot. The wound measures 1cm length x 1cm width x 0.2cm depth; 0.785cm^2 area and 0.157cm^3 volume. There is Fat Layer (Subcutaneous Tissue) exposed. There is a medium amount of serosanguineous drainage noted. There is medium (34-66%) red, pink granulation within the wound bed. There is a small (1-33%) amount of necrotic tissue within the wound bed including Adherent Slough. 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 There was a Excisional Skin/Subcutaneous Tissue oe Debridement with a total area of 0.2 sq cm performed by Allen Derry, PA-C. With the following instrument(s): Curette to remove Viable and Non-Viable tissue/material. Material removed includes Subcutaneous Tissue, Slough, and Biofilm after achieving pain control using Lidocaine 2% T opical Gel. No specimens were taken. A time out was conducted at 09:15, prior to the start of the procedure. A Minimum amount of bleeding was controlled with Pressure. The procedure was  tolerated well with a pain level of 0 throughout and a pain level of 0 following the procedure. Post Debridement Measurements: 0.5cm length x 0.5cm width x 1cm depth; 0.196cm^3 volume. Character of Wound/Ulcer Post Debridement is stable. Post procedure Diagnosis Wound #1: Same as Pre-Procedure Wound #2 Pre-procedure diagnosis of Wound #2 is a Diabetic Wound/Ulcer of the Lower Extremity located on the Right,Plantar Foot .Severity of Tissue Pre Debridement is: Fat layer exposed. There was a Excisional Skin/Subcutaneous Tissue Debridement with a total area of 0.13 sq cm performed by Allen Derry, PA-C. With the following instrument(s): Curette to remove Viable and Non-Viable tissue/material. Material removed includes Subcutaneous Tissue and Slough and after achieving pain control using Lidocaine 2% T opical Gel. No specimens were taken. A time out was  conducted at 09:15, prior to the start of the procedure. A Minimum amount of bleeding was controlled with Pressure. The procedure was tolerated well with a pain level of 0 throughout and a pain level of 0 following the procedure. Post Debridement Measurements: 0.4cm length x 0.4cm width x 0.1cm depth; 0.013cm^3 volume. Character of Wound/Ulcer Post Debridement is stable. Severity of Tissue Post Debridement is: Fat layer exposed. Post procedure Diagnosis Wound #2: Same as Pre-Procedure Wound #3 Pre-procedure diagnosis of Wound #3 is a Diabetic Wound/Ulcer of the Lower Extremity located on the Right,Lateral Foot .Severity of Tissue Pre Debridement is: Necrosis of muscle. There was a Excisional Skin/Subcutaneous Tissue/Muscle Debridement with a total area of 2.2 sq cm performed by Allen Derry, PA-C. With the following instrument(s): Forceps, and Scissors to remove Non-Viable tissue/material. Material removed includes Muscle, T endon, Subcutaneous Tissue, and Slough after achieving pain control using Lidocaine 2% Topical Gel. No specimens were taken. A time out was conducted at 09:15, prior to the start of the procedure. A Minimum amount of bleeding was controlled with Pressure. The procedure was tolerated well with a pain level of 0 throughout and a pain level of 0 following the procedure. Post Debridement Measurements: 2cm length x 1.4cm width x 0.3cm depth; 0.66cm^3 volume. Character of Wound/Ulcer Post Debridement is stable. Severity of Tissue Post Debridement is: Necrosis of muscle. Post procedure Diagnosis Wound #3: Same as Pre-Procedure KACEE, LUNDAY ([id number]) [phone].pdf Page 10 of 12 Plan Follow-up Appointments: Return Appointment in 1 week. Nurse Visit as needed Bathing/ Shower/ Hygiene: Wash wounds with antibacterial soap and water. May shower; gently cleanse wound with antibacterial soap, rinse and pat dry prior to dressing wounds Anesthetic (Use  'Patient Medications' Section for Anesthetic Order Entry): Lidocaine applied to wound bed Edema Control - Orders / Instructions: Tubigrip single layer applied. - E ABD and Kerlix for drainage on the leg Elevate, Exercise Daily and Avoid Standing for Long Periods of Time. Elevate legs to the level of the heart and pump ankles as often as possible Elevate leg(s) parallel to the floor when sitting. WOUND #1: - Amputation Site - T oe Wound Laterality: Right Cleanser: Byram Ancillary Kit - 15 Day Supply (Generic) 1 x Per Day/30 Days Discharge Instructions: Use supplies as instructed; Kit contains: (15) Saline Bullets; (15) 3x3 Gauze; 15 pr Gloves Prim Dressing: Gauze 1 x Per Day/30 Days ary Discharge Instructions: As directed: dry, moistened with saline or moistened with Dakins Solution Prim Dressing: Hydrofera Blue Ready Transfer Foam, 2.5x2.5 (in/in) 1 x Per Day/30 Days ary Discharge Instructions: Apply Hydrofera Blue Ready to wound bed as directed Secondary Dressing: Conforming Guaze Roll-Large (Generic) 1 x Per Day/30 Days Discharge Instructions: Apply Conforming Stretch Guaze Bandage as directed  Secured With: Medipore T - 36M Medipore H Soft Cloth Surgical T ape ape, 2x2 (in/yd) (Generic) 1 x Per Day/30 Days WOUND #2: - Foot Wound Laterality: Plantar, Right Cleanser: Soap and Water 3 x Per Week/30 Days Discharge Instructions: Gently cleanse wound with antibacterial soap, rinse and pat dry prior to dressing wounds Prim Dressing: Gauze 3 x Per Week/30 Days ary Discharge Instructions: As directed: dry, moistened with saline or moistened with Dakins Solution Prim Dressing: Hydrofera Blue Ready Transfer Foam, 2.5x2.5 (in/in) 3 x Per Week/30 Days ary Discharge Instructions: Apply Hydrofera Blue Ready to wound bed as directed Secondary Dressing: ABD Pad 5x9 (in/in) (Generic) 3 x Per Week/30 Days Discharge Instructions: Cover with ABD pad Secured With: Medipore T - 36M Medipore H Soft Cloth  Surgical T ape ape, 2x2 (in/yd) (Generic) 3 x Per Week/30 Days Secured With: [employer] Sterile or Non-Sterile 6-ply 4.5x4 (yd/yd) (Generic) 3 x Per Week/30 Days Discharge Instructions: Apply Kerlix as directed Secured With: Tubigrip Size E, 3.5x10 (in/yds) 3 x Per Week/30 Days Discharge Instructions: Apply 3 Tubigrip E 3-finger-widths below knee to base of toes to secure dressing and/or for swelling. WOUND #3: - Foot Wound Laterality: Right, Lateral Cleanser: Soap and Water 3 x Per Week/30 Days Discharge Instructions: Gently cleanse wound with antibacterial soap, rinse and pat dry prior to dressing wounds Topical: Santyl Collagenase Ointment, 30 (gm), tube 3 x Per Week/30 Days Discharge Instructions: apply nickel thick to wound bed only Prim Dressing: Gauze 3 x Per Week/30 Days ary Discharge Instructions: As directed: dry, moistened with saline or moistened with Dakins Solution Secondary Dressing: ABD Pad 5x9 (in/in) (Generic) 3 x Per Week/30 Days Discharge Instructions: Cover with ABD pad Secured With: Medipore T - 36M Medipore H Soft Cloth Surgical T ape ape, 2x2 (in/yd) (Generic) 3 x Per Week/30 Days Secured With: [employer] Sterile or Non-Sterile 6-ply 4.5x4 (yd/yd) (Generic) 3 x Per Week/30 Days Discharge Instructions: Apply Kerlix as directed Secured With: Tubigrip Size E, 3.5x10 (in/yds) 3 x Per Week/30 Days Discharge Instructions: Apply 3 Tubigrip E 3-finger-widths below knee to base of toes to secure dressing and/or for swelling. WOUND #5: - Foot Wound Laterality: Right, Posterior, Distal Cleanser: Soap and Water 1 x Per Day/30 Days Discharge Instructions: Gently cleanse wound with antibacterial soap, rinse and pat dry prior to dressing wounds Cleanser: Vashe 5.8 (oz) 1 x Per Day/30 Days Discharge Instructions: Use vashe 5.8 (oz) as directed Topical: Santyl Collagenase Ointment, 30 (gm), tube 1 x Per Day/30 Days Discharge Instructions: apply nickel thick to wound bed only Prim  Dressing: Hydrofera Blue Ready Transfer Foam, 2.5x2.5 (in/in) 1 x Per Day/30 Days ary Discharge Instructions: Apply Hydrofera Blue Ready to wound bed as directed Secondary Dressing: Kerlix 4.5 x 4.1 (in/yd) 1 x Per Day/30 Days Discharge Instructions: Apply Kerlix 4.5 x 4.1 (in/yd) as instructed 1. I am going to recommend based on what we are seeing that we have the patient going to continue with the [facility] to the surgical site as well as the distal plantar foot wound and the patient is in agreement with that plan. 2. Will continue with Santyl and Hydrofera Blue for the proximal plantar foot wound which is the newer on the bottom of the foot. 3. Will use Santyl and then gauze to the lateral foot wound. 4. I am going to recommend that the patient should continue to monitor for any signs of infection or worsening. We will keep a close eye on things I think we may  need to look towards an x-ray however of his foot and unlikely going to look at doing that come next week. Will see how things proceed in the interim but I think it may be time to proceed as such. 5. I did actually reach out to the vascular office while the patient was here in our clinic today. Subsequently the good news is we were able to get them on the phone and in fact was able to get this scheduled sooner for him is gena be having the arterial studies on the 20th and then on the 23rd we will see Dr. Gilda Crease. I think this is very good as of a little concerned about the arterial flow and this is a much sooner appointment than what we were previously looking at. I was very appreciative of them working him in. We will see patient back for reevaluation in 1 week here in the clinic. If anything worsens or changes patient will contact our office for additional recommendations. Electronic Signature(s) Signed: 10/02/2023 6:05:29 PM By: Allen Derry PA-C Ticer,Signed: 10/02/2023 6:05:29 PM By: Renee Rival D ([id number])  [id number].pdf Page 11 of 12 Entered By: Allen Derry on 10/02/2023 18:05:29 -------------------------------------------------------------------------------- SuperBill Details Patient Name: Date of Service: [name], [name] 10/02/2023 Medical Record Number: [id number] Patient Account Number: [id number] Date of Birth/Sex: Treating RN: Aug 18, 1941 (83 y.o. Barnett Abu, Leah Primary Care Provider: Debbra Riding Other Clinician: Referring Provider: Treating Provider/Extender: Jasmine Pang in Treatment: 14 Diagnosis Coding ICD-10 Codes Code Description T81.31XA Disruption of external operation (surgical) wound, not elsewhere classified, initial encounter E11.621 Type 2 diabetes mellitus with foot ulcer L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 L97.513 Non-pressure chronic ulcer of other part of right foot with necrosis of muscle I10 Essential (primary) hypertension 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 Z79.01 Long term (current) use of anticoagulants 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, stage 3 unspecified Facility Procedures : CPT4 Code: 09323557 Description: 11042 - DEB SUBQ TISSUE 20 SQ CM/< ICD-10 Diagnosis Description L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 Modifier: Quantity: 1 : CPT4 Code: 32202542 Description: 11043 - DEB MUSC/FASCIA 20 SQ CM/< ICD-10 Diagnosis Description L97.513 Non-pressure chronic ulcer of other part of right foot with necrosis of muscle Modifier: Quantity: 1 Physician Procedures : CPT4 Code Description Modifier [id number] - WC PHYS LEVEL 4 - EST PT 25 ICD-10 Diagnosis Description T81.31XA Disruption of external operation (surgical) wound, not elsewhere classified, initial encounter E11.621 Type 2 diabetes mellitus with foot   ulcer L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 L97.513 Non-pressure chronic ulcer of other part of right foot with necrosis of muscle Quantity: 1 : 11042 11042 - WC PHYS SUBQ TISS 20 SQ CM ICD-10 Diagnosis Description L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 Quantity: 1 Electronic Signature(s) Signed: 10/02/2023 6:06:27 PM By: Allen Derry PA-C Previous Signature: 10/02/2023 6:06:11 PM Version By: Allen Derry PA-C Entered By: Allen Derry on 10/02/2023 18:06:26

## 2023-10-07 ENCOUNTER — Ambulatory Visit (INDEPENDENT_AMBULATORY_CARE_PROVIDER_SITE_OTHER): Payer: Medicare Other

## 2023-10-07 DIAGNOSIS — I7025 Atherosclerosis of native arteries of other extremities with ulceration: Secondary | ICD-10-CM

## 2023-10-07 DIAGNOSIS — I70219 Atherosclerosis of native arteries of extremities with intermittent claudication, unspecified extremity: Secondary | ICD-10-CM | POA: Insufficient documentation

## 2023-10-07 DIAGNOSIS — E785 Hyperlipidemia, unspecified: Secondary | ICD-10-CM | POA: Insufficient documentation

## 2023-10-07 LAB — VAS US ABI WITH/WO TBI
Left ABI: 0.83
Right ABI: 0.55

## 2023-10-07 NOTE — Progress Notes (Unsigned)
MRN : [id number]  [name] is a 83 y.o. (Feb 01, 1941) male who presents with chief complaint of check circulation.  History of Present Illness:   The patient returns to the office for followup and review status post surgery on 04/11/2023.    Procedure: Right popliteal to peroneal bypass with reversed great saphenous vein graft    The patient notes improvement in the lower extremity symptoms. No interval shortening of the patient's claudication distance or rest pain symptoms. No new ulcers or wounds have occurred since the last visit.   There have been no significant changes to the patient's overall health care.   No documented history of amaurosis fugax or recent TIA symptoms. There are no recent neurological changes noted. No documented history of DVT, PE or superficial thrombophlebitis. The patient denies recent episodes of angina or shortness of breath.    Duplex US of the right lower extremity arterial system shows patent vein bypass.  ABI's done today Rt=*** and Lt=*** (previous ABI's Rt=*** and Lt=***)   No outpatient medications have been marked as taking for the 10/10/23 encounter (Appointment) with Gilda Crease, Latina Craver, MD.    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   a.) TTE 08/27/2022: Ao sinus 4.1, asc Ao 3.7, ST junction 3.3   Atherosclerotic PVD with intermittent claudication (HCC)    a.) s/p vascular intervention 03/26/2018:  post tibial and peroneal crosser athrectomy, PTA LEFT post tibial, SFA, and popliteal arteries; b.) s/p PTA 03/20/2023: RIGHT SFA and popliteal arteries   Bilateral carotid artery disease (HCC) 08/22/2022   a.) carotid doppler 08/22/2022: <50 LICA, 50-69% RICA   Blurry vision, bilateral    CAD (coronary artery disease) 09/21/2022   a.) cCTA  09/21/2022: 591 (57 %ile for age/sex/race matched control)   Chest pain    Chronic heart failure with preserved ejection fraction (HFpEF) (HCC)    a.) TTE 08/27/2022: EF >55%, aym LVH, mild LAE, triv AR/PR, mild MR/TR. mild AS, Ao root dilatation, G1DD   CKD (chronic kidney disease), stage III (HCC)    Gangrene of toe of right foot (HCC)    a.) RIGHT second toe   Heart murmur    History of bilateral cataract extraction 2020   HLD (hyperlipidemia)    Hypertension    Incomplete right bundle branch block (RBBB)    Long term current use of antithrombotics/antiplatelets    a.) clopidogrel   Neuropathy    Palpitations    T2DM (type 2 diabetes mellitus) (HCC)    Vertigo    Wears dentures    Full upper and lower (loose)    Past Surgical History:  Procedure Laterality Date   AMPUTATION TOE Left 03/28/2018   Procedure: AMPUTATION TOE/ PARTIAL RAY RESECTION-LEFT 2ND AND 3RD;  Surgeon: Linus Galas, DPM;  Location: [facility];  Service: Podiatry;  Laterality: Left;   AMPUTATION TOE Right 04/16/2023   Procedure: AMPUTATION TOE;  Surgeon: Felecia Shelling, DPM;  Location: [facility];  Service: Orthopedics/Podiatry;  Laterality: Right;   CATARACT EXTRACTION W/PHACO Left 02/10/2019   Procedure: CATARACT EXTRACTION PHACO AND INTRAOCULAR LENS PLACEMENT (IOC)  LEFT DIABETIC;  Surgeon: Nevada Crane, MD;  Location: [facility];  Service: Ophthalmology;  Laterality: Left;  Diabetic - oral meds   CATARACT EXTRACTION W/PHACO Right 04/20/2019   Procedure: CATARACT EXTRACTION PHACO AND INTRAOCULAR LENS PLACEMENT (IOC)  RIGHT DIABETIC;  Surgeon: Nevada Crane, MD;  Location: [facility];  Service: Ophthalmology;  Laterality: Right;   FEMORAL-TIBIAL BYPASS GRAFT Right 04/11/2023   Procedure: BYPASS GRAFT FEMORAL-TIBIAL ARTERY ( POPLITEAL TO PERONEAL);  Surgeon: Renford Dills, MD;  Location: [facility];  Service: Vascular;  Laterality: Right;   HERNIA REPAIR     LOWER EXTREMITY ANGIOGRAPHY  Left 03/26/2018   Procedure: Lower Extremity Angiography;  Surgeon: Renford Dills, MD;  Location: [facility];  Service: Cardiovascular;  Laterality: Left;   LOWER EXTREMITY ANGIOGRAPHY Right 03/20/2023   Procedure: Lower Extremity Angiography;  Surgeon: Renford Dills, MD;  Location: [facility];  Service: Cardiovascular;  Laterality: Right;   LOWER EXTREMITY INTERVENTION Right 05/03/2023   Procedure: LOWER EXTREMITY INTERVENTION;  Surgeon: Renford Dills, MD;  Location: [facility];  Service: Cardiovascular;  Laterality: Right;  With Staple removal   Percutaneous transluminal angioplasty right superficial femoral artery      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   Procedure: TRANSMETATARSAL AMPUTATION;  Surgeon: Recardo Evangelist, DPM;  Location: [facility];  Service: Podiatry;  Laterality: Left;    Social History Social History   Tobacco Use   Smoking status: Former    Current packs/day: 0.00    Types: Cigarettes    Quit date: 1985    Years since quitting: 40.0   Smokeless tobacco: Current    Types: Chew   Tobacco comments:    Sometime I chew tobacco and sometime I don't.  DJM 09/02/2024  Vaping Use   Vaping status: Never Used  Substance Use Topics   Alcohol use: Yes    Comment: once a month or once every 2-3 mos   Drug use: Never    Family History Family History  Problem Relation Age of Onset   Diabetes Mother    Cancer Father    Hypertension Maternal Grandfather    Heart attack Maternal Grandfather     Allergies  Allergen Reactions   Haemophilus B Polysaccharide Vaccine Nausea And Vomiting and Other (See Comments)    Fever, chills, vomiting.   Influenza Virus Vaccine Other (See Comments) and Nausea And Vomiting    Fever, chills, vomiting.      REVIEW OF SYSTEMS (Negative unless checked)  Constitutional: [] Weight loss  [] Fever  [] Chills Cardiac: [] Chest pain   [] Chest pressure   [] Palpitations   [] Shortness of breath  when laying flat   [] Shortness of breath with exertion. Vascular:  [x] Pain in legs with walking   [] Pain in legs at rest  [] History of DVT   [] Phlebitis   [] Swelling in legs   [] Varicose veins   [] Non-healing ulcers Pulmonary:   [] Uses home oxygen   [] Productive cough   [] Hemoptysis   [] Wheeze  [] COPD   [] Asthma Neurologic:  [] Dizziness   [] Seizures   [] History of stroke   [] History of TIA  [] Aphasia   [] Vissual changes   [] Weakness or numbness in arm   [] Weakness or numbness in leg Musculoskeletal:   [] Joint swelling   [] Joint pain   [] Low back pain Hematologic:  [] Easy bruising  [] Easy bleeding   []   Hypercoagulable state   [] Anemic Gastrointestinal:  [] Diarrhea   [] Vomiting  [] Gastroesophageal reflux/heartburn   [] Difficulty swallowing. Genitourinary:  [] Chronic kidney disease   [] Difficult urination  [] Frequent urination   [] Blood in urine Skin:  [] Rashes   [] Ulcers  Psychological:  [] History of anxiety   []  History of major depression.  Physical Examination  There were no vitals filed for this visit. There is no height or weight on file to calculate BMI. Gen: WD/WN, NAD Head: [place]/AT, No temporalis wasting.  Ear/Nose/Throat: Hearing grossly intact, nares w/o erythema or drainage Eyes: PER, EOMI, sclera nonicteric.  Neck: Supple, no masses.  No bruit or JVD.  Pulmonary:  Good air movement, no audible wheezing, no use of accessory muscles.  Cardiac: RRR, normal S1, S2, no Murmurs. Vascular:  mild trophic changes, no open wounds Vessel Right Left  Radial Palpable Palpable  PT Not Palpable Not Palpable  DP Not Palpable Not Palpable  Gastrointestinal: soft, non-distended. No guarding/no peritoneal signs.  Musculoskeletal: M/S 5/5 throughout.  No visible deformity.  Neurologic: CN 2-12 intact. Pain and light touch intact in extremities.  Symmetrical.  Speech is fluent. Motor exam as listed above. Psychiatric: Judgment intact, Mood & affect appropriate for pt's clinical  situation. Dermatologic: No rashes or ulcers noted.  No changes consistent with cellulitis.   CBC Lab Results  Component Value Date   WBC 8.1 05/04/2023   HGB 8.2 (L) 05/04/2023   HCT 25.5 (L) 05/04/2023   MCV 92.7 05/04/2023   PLT 176 05/04/2023    BMET    Component Value Date/Time   NA 135 05/04/2023 0617   K 4.2 05/04/2023 0617   CL 107 05/04/2023 0617   CO2 21 (L) 05/04/2023 0617   GLUCOSE 96 05/04/2023 0617   BUN 19 05/04/2023 0617   CREATININE 1.22 05/04/2023 0617   CALCIUM 8.2 (L) 05/04/2023 0617   GFRNONAA 59 (L) 05/04/2023 0617   GFRAA >60 07/30/2018 1045   CrCl cannot be calculated (Patient's most recent lab result is older than the maximum 21 days allowed.).  COAG Lab Results  Component Value Date   INR 1.2 03/19/2023   INR 1.25 07/18/2018    Radiology No results found.   Assessment/Plan There are no diagnoses linked to this encounter.   [name], MD  10/07/2023 12:40 PM

## 2023-10-08 ENCOUNTER — Encounter: Payer: Medicare Other | Admitting: Physician Assistant

## 2023-10-08 DIAGNOSIS — E11621 Type 2 diabetes mellitus with foot ulcer: Secondary | ICD-10-CM | POA: Diagnosis not present

## 2023-10-08 NOTE — Progress Notes (Addendum)
[name] ([id number]) [id number] Page 1 of 12 Visit Report for 10/08/2023 Chief Complaint Document Details Patient Name: Date of Service: [name], [name] 10/08/2023 3:45 PM Medical Record Number: [id number] Patient Account Number: [id number] Date of Birth/Sex: Treating RN: 1940/10/20 (83 y.o. [name] Primary Care Provider: Debbra Riding Other Clinician: Referring Provider: Treating Provider/Extender: Jasmine Pang in Treatment: 15 Information Obtained from: Patient Chief Complaint Right foot ulcers Electronic Signature(s) Signed: 10/08/2023 4:13:14 PM By: Allen Derry PA-C Entered By: Allen Derry on 10/08/2023 16:13:14 -------------------------------------------------------------------------------- Debridement Details Patient Name: Date of Service: [name], [place] RD [name] 10/08/2023 3:45 PM Medical Record Number: [id number] Patient Account Number: [id number] Date of Birth/Sex: Treating RN: 23-Jul-1941 (83 y.o. 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 Performed By: Physician Allen Derry, PA-C The following information was scribed by: Midge Aver The information was scribed for: Allen Derry Debridement Type: Debridement Level of Consciousness (Pre-procedure): Awake and Alert Pre-procedure Verification/Time Out Yes - 16:21 Taken: Start Time: 16:21 Pain Control: Lidocaine 4% T opical Solution Percent of Wound Bed Debrided: 100% T Area Debrided (cm): otal 0.05 Tissue and other material debrided: Viable, Non-Viable, Slough, Subcutaneous, Slough Level: Skin/Subcutaneous Tissue Debridement Description: Excisional Instrument: Curette Bleeding: Minimum Hemostasis Achieved: Pressure [name], [name] ([id number])  [phone].pdf Page 2 of 12 Procedural Pain: 0 Post Procedural Pain: 0 Response to Treatment: Procedure was tolerated well Level of Consciousness (Post- Awake and Alert procedure): Post Debridement Measurements of Total Wound Length: (cm) 0.3 Width: (cm) 0.2 Depth: (cm) 0.7 Volume: (cm) 0.033 Character of Wound/Ulcer Post Debridement: Stable Post Procedure Diagnosis Same as Pre-procedure Electronic Signature(s) Signed: 10/08/2023 5:12:31 PM By: Midge Aver MSN RN CNS WTA Signed: 10/08/2023 6:09:39 PM By: Allen Derry PA-C Entered By: Midge Aver on 10/08/2023 16:22:33 -------------------------------------------------------------------------------- Debridement Details Patient Name: Date of Service: [name], HO [place] RD [name] 10/08/2023 3:45 PM Medical Record Number: [id number] Patient Account Number: [id number] Date of Birth/Sex: Treating RN: Aug 13, 1941 (83 y.o. [name] Primary Care Provider: Debbra Riding Other Clinician: Referring Provider: Treating Provider/Extender: Jasmine Pang in Treatment: 15 Debridement Performed for Assessment: Wound #2 Right,Plantar Foot Performed By: Physician Allen Derry, PA-C The following information was scribed by: Midge Aver The information was scribed for: Allen Derry Debridement Type: Debridement Severity of Tissue Pre Debridement: Fat layer exposed Level of Consciousness (Pre-procedure): Awake and Alert Pre-procedure Verification/Time Out Yes - 16:21 Taken: Start Time: 16:21 Pain Control: Lidocaine 4% T opical Solution Percent of Wound Bed Debrided: 100% T Area Debrided (cm): otal 0.07 Tissue and other material debrided: Viable, Non-Viable, Callus, Slough, Subcutaneous, Slough Level: Skin/Subcutaneous Tissue Debridement Description: Excisional Instrument: Curette Bleeding: Minimum Hemostasis Achieved: Pressure Procedural Pain: 0 Post Procedural Pain: 0 Response to Treatment:  Procedure was tolerated well Level of Consciousness (Post- Awake and Alert procedure): Post Debridement Measurements of Total Wound Length: (cm) 0.3 Width: (cm) 0.3 Depth: (cm) 0.1 Volume: (cm) 0.007 Character of Wound/Ulcer Post Debridement: Stable Severity of Tissue Post Debridement: Fat layer exposed [name], [name] ([id number]) [id number] Page 3 of 12 Post Procedure Diagnosis Same as Pre-procedure Electronic Signature(s) Signed: 10/08/2023 5:12:31 PM By: Midge Aver MSN RN CNS WTA Signed: 10/08/2023 6:09:39 PM By: Allen Derry PA-C Entered By: Midge Aver on 10/08/2023 16:24:11 -------------------------------------------------------------------------------- Debridement Details Patient Name: Date of Service: [name], HO [place] RD  [name] 10/08/2023 3:45 PM Medical Record Number: [id number] Patient Account Number: [id number] Date of Birth/Sex: Treating RN: [birth year] (83 y.o. [name] Primary Care Provider: Debbra Riding Other Clinician: Referring Provider: Treating Provider/Extender: Jasmine Pang in Treatment: 15 Debridement Performed for Assessment: Wound #5 Right Calcaneus Performed By: Physician Allen Derry, PA-C The following information was scribed by: Midge Aver The information was scribed for: Allen Derry Debridement Type: Debridement Severity of Tissue Pre Debridement: Fat layer exposed Level of Consciousness (Pre-procedure): Awake and Alert Pre-procedure Verification/Time Out Yes - 16:21 Taken: Start Time: 16:21 Pain Control: Lidocaine 4% T opical Solution Percent of Wound Bed Debrided: 50% T Area Debrided (cm): otal 0.61 Tissue and other material debrided: Viable, Non-Viable, Eschar, Slough, Subcutaneous, Slough Level: Skin/Subcutaneous Tissue Debridement Description: Excisional Instrument: Curette, Forceps, Scissors Bleeding: Minimum Hemostasis Achieved: Pressure Procedural Pain: 0 Post Procedural Pain:  0 Response to Treatment: Procedure was tolerated well Level of Consciousness (Post- Awake and Alert procedure): Post Debridement Measurements of Total Wound Length: (cm) 1.2 Width: (cm) 1.3 Depth: (cm) 0.1 Volume: (cm) 0.123 Character of Wound/Ulcer Post Debridement: Stable Severity of Tissue Post Debridement: Fat layer exposed Post Procedure Diagnosis Same as Pre-procedure Electronic Signature(s) Signed: 10/08/2023 5:12:31 PM By: Midge Aver MSN RN CNS WTA Signed: 10/08/2023 6:09:39 PM By: Allen Derry PA-C Entered By: Midge Aver on 10/08/2023 16:27:12 [name] ([id number]) [id number] Page 4 of 12 -------------------------------------------------------------------------------- Debridement Details Patient Name: Date of Service: [name], [name] 10/08/2023 3:45 PM Medical Record Number: [id number] Patient Account Number: [id number] Date of Birth/Sex: Treating RN: Sep 28, 1940 (83 y.o. [name] Primary Care Provider: Debbra Riding Other Clinician: Referring Provider: Treating Provider/Extender: Jasmine Pang in Treatment: 15 Debridement Performed for Assessment: Wound #3 Right,Lateral Foot Performed By: Physician Allen Derry, PA-C The following information was scribed by: Midge Aver The information was scribed for: Allen Derry Debridement Type: Debridement Severity of Tissue Pre Debridement: Fat layer exposed Level of Consciousness (Pre-procedure): Awake and Alert Pre-procedure Verification/Time Out Yes - 16:21 Taken: Start Time: 16:21 Pain Control: Lidocaine 4% T opical Solution Percent of Wound Bed Debrided: 100% T Area Debrided (cm): otal 1.57 Tissue and other material debrided: Viable, Non-Viable, Muscle, Slough, Subcutaneous, Slough Level: Skin/Subcutaneous Tissue/Muscle Debridement Description: Excisional Instrument: Forceps, Scissors Bleeding: Minimum Hemostasis Achieved: Pressure Procedural  Pain: 0 Post Procedural Pain: 0 Response to Treatment: Procedure was tolerated well Level of Consciousness (Post- Awake and Alert procedure): Post Debridement Measurements of Total Wound Length: (cm) 2 Width: (cm) 1 Depth: (cm) 0.5 Volume: (cm) 0.785 Character of Wound/Ulcer Post Debridement: Stable Severity of Tissue Post Debridement: Fat layer exposed Post Procedure Diagnosis Same as Pre-procedure Electronic Signature(s) Signed: 10/08/2023 5:12:31 PM By: Midge Aver MSN RN CNS WTA Signed: 10/08/2023 6:09:39 PM By: Allen Derry PA-C Entered By: Midge Aver on 10/08/2023 16:28:27 [name] ([id number]) [id number] Page 5 of 12 -------------------------------------------------------------------------------- HPI Details Patient Name: Date of Service: [name], [name] 10/08/2023 3:45 PM Medical Record Number: [id number] Patient Account Number: [id number] Date of Birth/Sex: Treating RN: Oct 28, 1940 (83 y.o. [name] Primary Care Provider: Debbra Riding Other Clinician: Referring Provider: Treating Provider/Extender: Jasmine Pang in Treatment: 15 History of Present Illness Chronic/Inactive Conditions Condition 1: Notes from Vascular 05/21/23: Right below-the-knee popliteal artery to peroneal artery bypass with reverse saphenous vein graft. This was done on 04/11/2023. Noninvasive studies in our an ABI 0.61 on the right and 0.67 on the left. T oday arterial duplexes  were done which shows biphasic/triphasic waveforms throughout the lower extremities down to the level of the tibial vessels. 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 This is suggestive that the patient's bypass is patent although it is not there is no noted occlusion on duplex 1. Atherosclerosis of native arteries of the extremities with ulceration  (HCC) Based on noninvasive studies today appears that the patient would have possible marginal ability for wound healing. Concerned that some of the patient is usually a small vessel disease which may proved to have a little bit more difficult. Will have the patient return in several weeks to see if there has been improvement. They are advised that if the wound deteriorates or begins to show signs of starting to contact us for sooner follow-up. 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 With that being said that area is somewhat deep to be perfectly honest. It does have a little bit concerned. The remaining areas all came from the boot he was wearing and areas that rubbed they have gotten rid of the boot this will not happen again as far as that is concerned. With that being said the patient has had vascular intervention and the note is above as documented in the vascular note. With that being said it does appear that the patient has what is termed "possible marginal ability for wound healing". I think this is basically something this can be quite difficult for [place] to completely close. I am not seeing them possible but again there is a long road ahead. Patient does have a history of diabetes mellitus type 2, hypertension, peripheral vascular disease, coronary artery disease, long-term use of anticoagulants, chronic kidney disease stage III, and as of right now there is no direct evidence of continued and ongoing osteomyelitis although I do question whether or not there may be an issue here. 10-08-2023 upon evaluation today patient appears to be doing decently well currently in regard to his wounds. He has been tolerating the dressing changes without complication. Fortunately I do not see any signs of active infection which  is good news. He is still having pain and we did review the results of his arterial study and testing and this shows that he does have some issues here with his blood flow and not sure how significant Dr. Gilda Crease is gena feel this is. He is gena be seeing him on Thursday. Electronic Signature(s) Signed: 10/08/2023 5:43:53 PM By: Allen Derry PA-C Entered By: Allen Derry on 10/08/2023 17:43:53 -------------------------------------------------------------------------------- Physical Exam Details Patient Name: Date of Service: [name], [name] 10/08/2023 3:45 PM Medical Record Number: [id number] Patient Account Number: [id number] Date of Birth/Sex: Treating RN: 02/24/1941 (83 y.o. [name] Primary Care Provider: Debbra Riding Other Clinician: Referring Provider: Treating Provider/Extender: Jasmine Pang in Treatment: 15 Constitutional Well-nourished and well-hydrated in no acute distress. Respiratory normal breathing without difficulty. Psychiatric this patient is able to make decisions and demonstrates good insight into disease process. Alert and Oriented x 3. pleasant and cooperative. Notes Upon inspection I did perform debridement to the patient's wounds at all sites he tolerated those debridements today without complication and postdebridement wound bed appears to be doing significantly better which is great news. River, Maximiliano [name] (  [id number]) [id number] Page 6 of 12 Electronic Signature(s) Signed: 10/08/2023 5:44:07 PM By: Allen Derry PA-C Entered By: Allen Derry on 10/08/2023 17:44:07 -------------------------------------------------------------------------------- Physician Orders Details Patient Name: Date of Service: [name], [name] 10/08/2023 3:45 PM Medical Record Number: [id number] Patient Account Number: [id number] Date of Birth/Sex: Treating RN: 05-03-1941 (83 y.o. [name] Primary Care Provider:  Debbra Riding Other Clinician: Referring Provider: Treating Provider/Extender: Jasmine Pang in Treatment: 15 The following information was scribed by: Midge Aver The information was scribed for: Allen Derry Verbal / Phone Orders: No Diagnosis Coding ICD-10 Coding Code Description T81.31XA Disruption of external operation (surgical) wound, not elsewhere classified, initial encounter E11.621 Type 2 diabetes mellitus with foot ulcer L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 L97.513 Non-pressure chronic ulcer of other part of right foot with necrosis of muscle I10 Essential (primary) hypertension 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 Z79.01 Long term (current) use of anticoagulants 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 Nurse Visit as needed [employer] wounds with antibacterial soap and water. May shower; gently cleanse wound with antibacterial soap, rinse and pat dry prior to dressing wounds Anesthetic (Use 'Patient Medications' Section for Anesthetic Order Entry) Lidocaine applied to wound bed Edema Control - Orders / Instructions Tubigrip single layer applied. - E ABD and Kerlix for drainage on the leg Elevate, Exercise Daily and A void Standing for Long Periods of Time. Elevate legs to the level of the heart and pump ankles as often as possible Elevate leg(s) parallel to the floor when sitting. Wound Treatment Wound #1 - Amputation Site - Toe Wound Laterality: Right Cleanser: Byram Ancillary Kit - 15 Day Supply (Generic) 1 x Per Day/30 Days Discharge Instructions: Use supplies as instructed; Kit contains: (15) Saline Bullets; (15) 3x3 Gauze; 15 pr Gloves Prim Dressing: Gauze 1 x Per Day/30 Days ary Discharge Instructions: As  directed: dry, moistened with saline or moistened with Dakins Solution Prim Dressing: Hydrofera Blue Ready Transfer Foam, 2.5x2.5 (in/in) 1 x Per Day/30 Days ary Discharge Instructions: Apply Hydrofera Blue Ready to wound bed as directed Secondary Dressing: Conforming Guaze Roll-Large (Generic) 1 x Per Day/30 Days Discharge Instructions: Apply Conforming Stretch Guaze Bandage as directed [name], [name] ([id number]) [phone].pdf Page 7 of 12 Secured With: Medipore T - 94M Medipore H Soft Cloth Surgical T ape ape, 2x2 (in/yd) (Generic) 1 x Per Day/30 Days Wound #2 - Foot Wound Laterality: Plantar, Right Cleanser: Soap and Water 3 x Per Week/30 Days Discharge Instructions: Gently cleanse wound with antibacterial soap, rinse and pat dry prior to dressing wounds Prim Dressing: Gauze 3 x Per Week/30 Days ary Discharge Instructions: As directed: dry, moistened with saline or moistened with Dakins Solution Prim Dressing: Hydrofera Blue Ready Transfer Foam, 2.5x2.5 (in/in) 3 x Per Week/30 Days ary Discharge Instructions: Apply Hydrofera Blue Ready to wound bed as directed Secondary Dressing: ABD Pad 5x9 (in/in) (Generic) 3 x Per Week/30 Days Discharge Instructions: Cover with ABD pad Secured With: Medipore T - 94M Medipore H Soft Cloth Surgical T ape ape, 2x2 (in/yd) (Generic) 3 x Per Week/30 Days Secured With: [employer] Sterile or Non-Sterile 6-ply 4.5x4 (yd/yd) (Generic) 3 x Per Week/30 Days Discharge Instructions: Apply Kerlix as directed Secured With: Tubigrip Size E, 3.5x10 (in/yds) 3 x Per Week/30 Days Discharge Instructions: Apply 3 Tubigrip E 3-finger-widths  below knee to base of toes to secure dressing and/or for swelling. Wound #3 - Foot Wound Laterality: Right, Lateral Cleanser: Soap and Water 3 x Per Week/30 Days Discharge Instructions: Gently cleanse wound with antibacterial soap, rinse and pat dry prior to dressing wounds Topical: Santyl Collagenase  Ointment, 30 (gm), tube 3 x Per Week/30 Days Discharge Instructions: apply nickel thick to wound bed only Prim Dressing: Gauze 3 x Per Week/30 Days ary Discharge Instructions: As directed: dry, moistened with saline or moistened with Dakins Solution Secondary Dressing: ABD Pad 5x9 (in/in) (Generic) 3 x Per Week/30 Days Discharge Instructions: Cover with ABD pad Secured With: Medipore T - 63M Medipore H Soft Cloth Surgical T ape ape, 2x2 (in/yd) (Generic) 3 x Per Week/30 Days Secured With: [employer] Sterile or Non-Sterile 6-ply 4.5x4 (yd/yd) (Generic) 3 x Per Week/30 Days Discharge Instructions: Apply Kerlix as directed Secured With: Tubigrip Size E, 3.5x10 (in/yds) 3 x Per Week/30 Days Discharge Instructions: Apply 3 Tubigrip E 3-finger-widths below knee to base of toes to secure dressing and/or for swelling. Wound #5 - Calcaneus Wound Laterality: Right Cleanser: Soap and Water 1 x Per Day/30 Days Discharge Instructions: Gently cleanse wound with antibacterial soap, rinse and pat dry prior to dressing wounds Cleanser: Vashe 5.8 (oz) 1 x Per Day/30 Days Discharge Instructions: Use vashe 5.8 (oz) as directed Topical: Santyl Collagenase Ointment, 30 (gm), tube 1 x Per Day/30 Days Discharge Instructions: apply nickel thick to wound bed only Prim Dressing: Hydrofera Blue Ready Transfer Foam, 2.5x2.5 (in/in) 1 x Per Day/30 Days ary Discharge Instructions: Apply Hydrofera Blue Ready to wound bed as directed Secondary Dressing: Kerlix 4.5 x 4.1 (in/yd) 1 x Per Day/30 Days Discharge Instructions: Apply Kerlix 4.5 x 4.1 (in/yd) as instructed Electronic Signature(s) Signed: 10/08/2023 5:12:31 PM By: Midge Aver MSN RN CNS WTA Signed: 10/08/2023 6:09:39 PM By: Allen Derry PA-C Entered By: Midge Aver on 10/08/2023 16:29:25 [name] ([id number]) [id number] Page 8 of 12 -------------------------------------------------------------------------------- Problem List  Details Patient Name: Date of Service: [name], [name] 10/08/2023 3:45 PM Medical Record Number: [id number] Patient Account Number: [id number] Date of Birth/Sex: Treating RN: 1941/08/18 (83 y.o. [name] Primary Care Provider: Debbra Riding Other Clinician: Referring Provider: Treating Provider/Extender: Jasmine Pang in Treatment: 15 Active Problems ICD-10 Encounter Code Description Active Date MDM Diagnosis T81.31XA Disruption of external operation (surgical) wound, not elsewhere classified, 06/20/2023 No Yes initial encounter E11.621 Type 2 diabetes mellitus with foot ulcer 06/20/2023 No Yes L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 06/20/2023 No Yes L97.513 Non-pressure chronic ulcer of other part of right foot with necrosis of muscle 09/03/2023 No Yes I10 Essential (primary) hypertension 06/20/2023 No Yes 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 06/20/2023 No Yes I25.10 Atherosclerotic heart disease of native coronary artery without angina pectoris 06/20/2023 No Yes Z79.01 Long term (current) use of anticoagulants 06/20/2023 No Yes 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, stage 3 unspecified 06/20/2023 No Yes Inactive Problems Resolved Problems Electronic Signature(s) Signed: 10/08/2023 4:13:12 PM By: Allen Derry PA-C Entered By: Allen Derry on 10/08/2023 16:13:12 [name] ([id number]) [id number] Page 9 of 12 -------------------------------------------------------------------------------- Progress Note Details Patient Name: Date of Service: [name], [name] 10/08/2023 3:45 PM Medical Record Number: [id number] Patient Account Number: [id number] Date of Birth/Sex: Treating RN: 24-May-1941 (83 y.o. [name] Primary Care Provider: Debbra Riding Other Clinician: Referring Provider: Treating Provider/Extender: Jasmine Pang in Treatment:  15 Subjective Chief Complaint Information obtained from Patient Right foot ulcers History of Present Illness (HPI) Chronic/Inactive Condition: Notes from Vascular 05/21/23: Right below-the-knee popliteal artery to peroneal artery bypass with reverse saphenous vein graft. This was done on 04/11/2023. Noninvasive studies in our an ABI 0.61 on the right and 0.67 on the left. T oday arterial duplexes were done which shows biphasic/triphasic waveforms throughout the lower extremities down to the level of the tibial vessels. 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 This is suggestive that the patient's bypass is patent although it is not there is no noted occlusion on duplex 1. Atherosclerosis of native arteries of the extremities with ulceration (HCC) Based on noninvasive studies today appears that the patient would have possible marginal ability for wound healing. Concerned that some of the patient is usually a small vessel disease which may proved to have a little bit more difficult. Will have the patient return in several weeks to see if there has been improvement. They are advised that if the wound deteriorates or begins to show signs of starting to contact us for sooner follow-up. 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 With that being said that area is somewhat deep to be perfectly honest. It does have a little bit concerned. The remaining areas all came from the boot he was wearing and areas that rubbed they have gotten rid of the boot this will not happen again as far as that is concerned. With that being said the patient has had vascular intervention and the note is above as documented in the vascular note. With  that being said it does appear that the patient has what is termed "possible marginal ability for wound healing". I think this is basically something this can be quite difficult for [place] to completely close. I am not seeing them possible but again there is a long road ahead. Patient does have a history of diabetes mellitus type 2, hypertension, peripheral vascular disease, coronary artery disease, long-term use of anticoagulants, chronic kidney disease stage III, and as of right now there is no direct evidence of continued and ongoing osteomyelitis although I do question whether or not there may be an issue here. 10-08-2023 upon evaluation today patient appears to be doing decently well currently in regard to his wounds. He has been tolerating the dressing changes without complication. Fortunately I do not see any signs of active infection which is good news. He is still having pain and we did review the results of his arterial study and testing and this shows that he does have some issues here with his blood flow and not sure how significant Dr. Gilda Crease is gena feel this is. He is gena be seeing him on Thursday. Objective Constitutional Well-nourished and well-hydrated in no acute distress. Vitals Time Taken: 3:51 PM, Height: 68 in, Weight: 167 lbs, BMI: 25.4, Temperature: 98.2 F, Pulse: 72 bpm, Respiratory Rate: 18 breaths/min, Blood Pressure: 134/57 mmHg. Respiratory normal breathing without difficulty. Psychiatric this patient is able to make decisions and demonstrates good insight into disease process. Alert and Oriented x 3. pleasant and cooperative. General Notes: Upon inspection I did perform debridement to the patient's wounds at all sites he tolerated those debridements today without complication and postdebridement wound bed appears  to be doing significantly better which is great news. Integumentary (Hair, Skin) Wound #1 status is Open. Original cause of wound was Surgical Injury. The  date acquired was: 04/16/2023. The wound has been in treatment 15 weeks. 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 [name], [name] ([id number]) [id number] Page 10 of 12 There is Fat Layer (Subcutaneous Tissue) exposed. There is a small amount of serous drainage noted. There is large (67-100%) red, pink granulation within the wound bed. There is no necrotic tissue within the wound bed. Wound #2 status is Open. Original cause of wound was Footwear Injury. The date acquired was: 06/16/2023. The wound has been in treatment 15 weeks. The wound is located on the Right,Plantar Foot. The wound measures 0.3cm length x 0.3cm width x 0.1cm depth; 0.071cm^2 area and 0.007cm^3 volume. There is Fat Layer (Subcutaneous Tissue) exposed. There is a small amount of serous drainage noted. There is medium (34-66%) red, pink granulation within the wound bed. There is a small (1-33%) amount of necrotic tissue within the wound bed including Adherent Slough. Wound #3 status is Open. Original cause of wound was Footwear Injury. The date acquired was: 06/16/2023. The wound has been in treatment 15 weeks. The wound is located on the Right,Lateral Foot. The wound measures 2cm length x 1cm width x 0.5cm depth; 1.571cm^2 area and 0.785cm^3 volume. There is muscle, Fat Layer (Subcutaneous Tissue), and fascia exposed. There is undermining starting at 9:00 and ending at 11:00 with a maximum distance of 1.3cm. There is a medium amount of serous drainage noted. Foul odor after cleansing was noted. There is no granulation within the wound bed. There is a large (67- 100%) amount of necrotic tissue within the wound bed including Adherent Slough and Necrosis of Muscle. Wound #5 status is Open. Original cause of wound was Gradually Appeared. The date acquired was: 09/16/2023. The wound has been in  treatment 2 weeks. The wound is located on the Right Calcaneus. The wound measures 1.2cm length x 1.3cm width x 0.1cm depth; 1.225cm^2 area and 0.123cm^3 volume. There is Fat Layer (Subcutaneous Tissue) exposed. There is a medium amount of serosanguineous drainage noted. There is medium (34-66%) red, pink granulation within the wound bed. There is a small (1-33%) amount of necrotic tissue within the wound bed including Adherent Slough. 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 There was a Excisional Skin/Subcutaneous Tissue oe Debridement with a total area of 0.05 sq cm performed by Allen Derry, PA-C. With the following instrument(s): Curette to remove Viable and Non-Viable tissue/material. Material removed includes Subcutaneous Tissue and Slough and after achieving pain control using Lidocaine 4% T opical Solution. No specimens were taken. A time out was conducted at 16:21, prior to the start of the procedure. A Minimum amount of bleeding was controlled with Pressure. The procedure was tolerated well with a pain level  of 0 throughout and a pain level of 0 following the procedure. Post Debridement Measurements: 0.3cm length x 0.2cm width x 0.7cm depth; 0.033cm^3 volume. Character of Wound/Ulcer Post Debridement is stable. Post procedure Diagnosis Wound  #1: Same as Pre-Procedure Wound #2 Pre-procedure diagnosis of Wound #2 is a Diabetic Wound/Ulcer of the Lower Extremity located on the Right,Plantar Foot .Severity of Tissue Pre Debridement is: Fat layer exposed. There was a Excisional Skin/Subcutaneous Tissue Debridement with a total area of 0.07 sq cm performed by Allen Derry, PA-C. With the following instrument(s): Curette to remove Viable and Non-Viable tissue/material. Material removed includes Callus, Subcutaneous Tissue, and Slough after achieving pain control using Lidocaine 4% T opical Solution. No specimens were taken. A time out was conducted at 16:21, prior to the start of the procedure. A Minimum amount of bleeding was controlled with Pressure. The procedure was tolerated well with a pain level of 0 throughout and a pain level of 0 following the procedure. Post Debridement Measurements: 0.3cm length x 0.3cm width x 0.1cm depth; 0.007cm^3 volume. Character of Wound/Ulcer Post Debridement is stable. Severity of Tissue Post Debridement is: Fat layer exposed. Post procedure Diagnosis Wound #2: Same as Pre-Procedure Wound #3 Pre-procedure diagnosis of Wound #3 is a Diabetic Wound/Ulcer of the Lower Extremity located on the Right,Lateral Foot .Severity of Tissue Pre Debridement is: Fat layer exposed. There was a Excisional Skin/Subcutaneous Tissue/Muscle Debridement with a total area of 1.57 sq cm performed by Allen Derry, PA-C. With the following instrument(s): Forceps, and Scissors to remove Viable and Non-Viable tissue/material. Material removed includes Muscle, Subcutaneous Tissue, and Slough after achieving pain control using Lidocaine 4% Topical Solution. No specimens were taken. A time out was conducted at 16:21, prior to the start of the procedure. A Minimum amount of bleeding was controlled with Pressure. The procedure was tolerated well with a pain level of 0 throughout and a pain level of 0 following the procedure. Post Debridement  Measurements: 2cm length x 1cm width x 0.5cm depth; 0.785cm^3 volume. Character of Wound/Ulcer Post Debridement is stable. Severity of Tissue Post Debridement is: Fat layer exposed. Post procedure Diagnosis Wound #3: Same as Pre-Procedure Wound #5 Pre-procedure diagnosis of Wound #5 is a Diabetic Wound/Ulcer of the Lower Extremity located on the Right Calcaneus .Severity of Tissue Pre Debridement is: Fat layer exposed. There was a Excisional Skin/Subcutaneous Tissue Debridement with a total area of 0.61 sq cm performed by Allen Derry, PA-C. With the following instrument(s): Curette, Forceps, and Scissors to remove Viable and Non-Viable tissue/material. Material removed includes Eschar, Subcutaneous Tissue, and Slough after achieving pain control using Lidocaine 4% Topical Solution. No specimens were taken. A time out was conducted at 16:21, prior to the start of the procedure. A Minimum amount of bleeding was controlled with Pressure. The procedure was tolerated well with a pain level of 0 throughout and a pain level of 0 following the procedure. Post Debridement Measurements: 1.2cm length x 1.3cm width x 0.1cm depth; 0.123cm^3 volume. Character of Wound/Ulcer Post Debridement is stable. Severity of Tissue Post Debridement is: Fat layer exposed. Post procedure Diagnosis Wound #5: Same as Pre-Procedure [name], [name] ([id number]) [phone].pdf Page 11 of 12 Plan Follow-up Appointments: Return Appointment in 1 week. Nurse Visit as needed Bathing/ Shower/ Hygiene: Wash wounds with antibacterial soap and water. May shower; gently cleanse wound with antibacterial soap, rinse and pat dry prior to dressing wounds Anesthetic (Use 'Patient Medications' Section for Anesthetic Order Entry): Lidocaine applied to wound bed Edema Control - Orders /  Instructions: Tubigrip single layer applied. - E ABD and Kerlix for drainage on the leg Elevate, Exercise Daily and Avoid Standing  for Long Periods of Time. Elevate legs to the level of the heart and pump ankles as often as possible Elevate leg(s) parallel to the floor when sitting. WOUND #1: - Amputation Site - T oe Wound Laterality: Right Cleanser: Byram Ancillary Kit - 15 Day Supply (Generic) 1 x Per Day/30 Days Discharge Instructions: Use supplies as instructed; Kit contains: (15) Saline Bullets; (15) 3x3 Gauze; 15 pr Gloves Prim Dressing: Gauze 1 x Per Day/30 Days ary Discharge Instructions: As directed: dry, moistened with saline or moistened with Dakins Solution Prim Dressing: Hydrofera Blue Ready Transfer Foam, 2.5x2.5 (in/in) 1 x Per Day/30 Days ary Discharge Instructions: Apply Hydrofera Blue Ready to wound bed as directed Secondary Dressing: Conforming Guaze Roll-Large (Generic) 1 x Per Day/30 Days Discharge Instructions: Apply Conforming Stretch Guaze Bandage as directed Secured With: Medipore T - 9M Medipore H Soft Cloth Surgical T ape ape, 2x2 (in/yd) (Generic) 1 x Per Day/30 Days WOUND #2: - Foot Wound Laterality: Plantar, Right Cleanser: Soap and Water 3 x Per Week/30 Days Discharge Instructions: Gently cleanse wound with antibacterial soap, rinse and pat dry prior to dressing wounds Prim Dressing: Gauze 3 x Per Week/30 Days ary Discharge Instructions: As directed: dry, moistened with saline or moistened with Dakins Solution Prim Dressing: Hydrofera Blue Ready Transfer Foam, 2.5x2.5 (in/in) 3 x Per Week/30 Days ary Discharge Instructions: Apply Hydrofera Blue Ready to wound bed as directed Secondary Dressing: ABD Pad 5x9 (in/in) (Generic) 3 x Per Week/30 Days Discharge Instructions: Cover with ABD pad Secured With: Medipore T - 9M Medipore H Soft Cloth Surgical T ape ape, 2x2 (in/yd) (Generic) 3 x Per Week/30 Days Secured With: [employer] Sterile or Non-Sterile 6-ply 4.5x4 (yd/yd) (Generic) 3 x Per Week/30 Days Discharge Instructions: Apply Kerlix as directed Secured With: Tubigrip Size E, 3.5x10  (in/yds) 3 x Per Week/30 Days Discharge Instructions: Apply 3 Tubigrip E 3-finger-widths below knee to base of toes to secure dressing and/or for swelling. WOUND #3: - Foot Wound Laterality: Right, Lateral Cleanser: Soap and Water 3 x Per Week/30 Days Discharge Instructions: Gently cleanse wound with antibacterial soap, rinse and pat dry prior to dressing wounds Topical: Santyl Collagenase Ointment, 30 (gm), tube 3 x Per Week/30 Days Discharge Instructions: apply nickel thick to wound bed only Prim Dressing: Gauze 3 x Per Week/30 Days ary Discharge Instructions: As directed: dry, moistened with saline or moistened with Dakins Solution Secondary Dressing: ABD Pad 5x9 (in/in) (Generic) 3 x Per Week/30 Days Discharge Instructions: Cover with ABD pad Secured With: Medipore T - 9M Medipore H Soft Cloth Surgical T ape ape, 2x2 (in/yd) (Generic) 3 x Per Week/30 Days Secured With: [employer] Sterile or Non-Sterile 6-ply 4.5x4 (yd/yd) (Generic) 3 x Per Week/30 Days Discharge Instructions: Apply Kerlix as directed Secured With: Tubigrip Size E, 3.5x10 (in/yds) 3 x Per Week/30 Days Discharge Instructions: Apply 3 Tubigrip E 3-finger-widths below knee to base of toes to secure dressing and/or for swelling. WOUND #5: - Calcaneus Wound Laterality: Right Cleanser: Soap and Water 1 x Per Day/30 Days Discharge Instructions: Gently cleanse wound with antibacterial soap, rinse and pat dry prior to dressing wounds Cleanser: Vashe 5.8 (oz) 1 x Per Day/30 Days Discharge Instructions: Use vashe 5.8 (oz) as directed Topical: Santyl Collagenase Ointment, 30 (gm), tube 1 x Per Day/30 Days Discharge Instructions: apply nickel thick to wound bed only Prim Dressing: Hydrofera Blue  Ready Transfer Foam, 2.5x2.5 (in/in) 1 x Per Day/30 Days ary Discharge Instructions: Apply Hydrofera Blue Ready to wound bed as directed Secondary Dressing: Kerlix 4.5 x 4.1 (in/yd) 1 x Per Day/30 Days Discharge Instructions: Apply Kerlix  4.5 x 4.1 (in/yd) as instructed 1. I would recommend based on what we are seeing that we have the patient going and continue to utilize the dressings as before. We are using Santyl to the heel with Hydrofera Blue over using Santyl to the side of the foot. Were also going to be continuing to use the [facility] to the plantar wound on the forefoot as well as the amputation site. 2. I would recommend as well that he should continue with his current use of shoes I really do not think there is anything better the stocking or risking falling and injuring himself. 3. And also can recommend patient should continue to elevate his legs much as possible. Fortunately I do not see any signs of infection at this time. We will see patient back for reevaluation in 1 week here in the clinic. If anything worsens or changes patient will contact our office for additional recommendations. Electronic Signature(s) Signed: 10/08/2023 5:45:29 PM By: Allen Derry PA-C Entered By: Allen Derry on 10/08/2023 17:45:29 [name] ([id number]) [id number] Page 12 of 12 -------------------------------------------------------------------------------- SuperBill Details Patient Name: Date of Service: [name], [name] 10/08/2023 Medical Record Number: [id number] Patient Account Number: [id number] Date of Birth/Sex: Treating RN: Jul 06, 1941 (83 y.o. [name] Primary Care Provider: Debbra Riding Other Clinician: Referring Provider: Treating Provider/Extender: Jasmine Pang in Treatment: 15 Diagnosis Coding ICD-10 Codes Code Description T81.31XA Disruption of external operation (surgical) wound, not elsewhere classified, initial encounter E11.621 Type 2 diabetes mellitus with foot ulcer L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 L97.513 Non-pressure chronic ulcer of other part of right foot with necrosis of  muscle I10 Essential (primary) hypertension 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 Z79.01 Long term (current) use of anticoagulants 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, stage 3 unspecified Facility Procedures : CPT4 Code: [id number] Description: 11042 - DEB SUBQ TISSUE 20 SQ CM/< ICD-10 Diagnosis Description L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 Modifier: Quantity: 1 : CPT4 Code: 16606301 Description: 11043 - DEB MUSC/FASCIA 20 SQ CM/< ICD-10 Diagnosis Description L97.513 Non-pressure chronic ulcer of other part of right foot with necrosis of muscle Modifier: Quantity: 1 Physician Procedures : CPT4 Code Description Modifier 11042 11042 - WC PHYS SUBQ TISS 20 SQ CM ICD-10 Diagnosis Description L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 Quantity: 1 : [id number] 11043 - WC PHYS DEBR MUSCLE/FASCIA 20 SQ CM ICD-10 Diagnosis Description L97.513 Non-pressure chronic ulcer of other part of right foot with necrosis of muscle Quantity: 1 Electronic Signature(s) Signed: 10/08/2023 5:53:45 PM By: Allen Derry PA-C Entered By: Allen Derry on 10/08/2023 17:53:44

## 2023-10-08 NOTE — Progress Notes (Signed)
[name] ([id number]) [id number] Page 1 of 14 Visit Report for 10/08/2023 Arrival Information Details Patient Name: Date of Service: [name], [name] 10/08/2023 3:45 PM Medical Record Number: [id number] Patient Account Number: [id number] Date of Birth/Sex: Treating RN: August 04, 1941 (83 y.o. [name] Primary Care [name]: [name] Other Clinician: Referring [name]: Treating [name]/Extender: [name] in Treatment: 15 Visit Information History Since Last Visit Added or deleted any medications: No Patient Arrived: Wheel Chair Any new allergies or adverse reactions: No Arrival Time: 15:45 Had a fall or experienced change in No Accompanied By: daughter in law activities of daily living that may affect Transfer Assistance: None risk of falls: Patient Identification Verified: Yes Signs or symptoms of abuse/neglect since last visito No Secondary Verification Process Completed: Yes Hospitalized since last visit: No Patient Requires Transmission-Based Precautions: No Has Dressing in Place as Prescribed: Yes Patient Has Alerts: Yes Pain Present Now: No Patient Alerts: Patient on Blood Thinner diabetes type 2 Plavix Electronic Signature(s) Signed: 10/08/2023 5:12:31 PM By: Charles Aver MSN RN CNS WTA Entered By: [name] on 10/08/2023 15:45:56 -------------------------------------------------------------------------------- Clinic Level of Care Assessment Details Patient Name: Date of Service: [place], [name] 10/08/2023 3:45 PM Medical Record Number: [id number] Patient Account Number: [id number] Date of Birth/Sex: Treating RN: 1941/06/19 (83 y.o. [name] Primary Care [name]: [name] Other Clinician: Referring [name]: Treating [name]/Extender: [name] in Treatment: 15 Clinic Level of Care Assessment Items TOOL 1 Quantity Score []  - 0 Use when EandM  and Procedure is performed on INITIAL visit ASSESSMENTS - Nursing Assessment / Reassessment []  - 0 General Physical Exam (combine w/ comprehensive assessment (listed just below) when performed on new pt. evals) []  - 0 Comprehensive Assessment (HX, ROS, Risk Assessments, Wounds Hx, etc.) ASSESSMENTS - Wound and Skin Assessment / Reassessment []  - 0 Dermatologic / Skin Assessment (not related to wound area) [name] ([id number]) [id number] Page 2 of 14 ASSESSMENTS - Ostomy and/or Continence Assessment and Care []  - 0 Incontinence Assessment and Management []  - 0 Ostomy Care Assessment and Management (repouching, etc.) PROCESS - Coordination of Care []  - 0 Simple Patient / Family Education for ongoing care []  - 0 Complex (extensive) Patient / Family Education for ongoing care []  - 0 Staff obtains Chiropractor, Records, T Results / Process Orders est []  - 0 Staff telephones HHA, Nursing Homes / Clarify orders / etc []  - 0 Routine Transfer to another Facility (non-emergent condition) []  - 0 Routine Hospital Admission (non-emergent condition) []  - 0 New Admissions / Manufacturing engineer / Ordering NPWT Apligraf, etc. , []  - 0 Emergency Hospital Admission (emergent condition) PROCESS - Special Needs []  - 0 Pediatric / Minor Patient Management []  - 0 Isolation Patient Management []  - 0 Hearing / Language / Visual special needs []  - 0 Assessment of Community assistance (transportation, [name]/C planning, etc.) []  - 0 Additional assistance / Altered mentation []  - 0 Support Surface(s) Assessment (bed, cushion, seat, etc.) INTERVENTIONS - Miscellaneous []  - 0 External ear exam []  - 0 Patient Transfer (multiple staff / Nurse, adult / Similar devices) []  - 0 Simple Staple / Suture removal (25 or less) []  - 0 Complex Staple / Suture removal (26 or more) []  - 0 Hypo/Hyperglycemic Management (do not check if billed separately) []  - 0 Ankle /  Brachial Index (ABI) - do not check if billed separately Has the patient been seen at the hospital within the last  three years: Yes Total Score: 0 Level Of Care: ____ Electronic Signature(s) Signed: 10/08/2023 5:12:31 PM By: Charles Aver MSN RN CNS WTA Entered By: [name] on 10/08/2023 16:29:32 -------------------------------------------------------------------------------- Encounter Discharge Information Details Patient Name: Date of Service: [name], [place] RD [name] 10/08/2023 3:45 PM Medical Record Number: [id number] Patient Account Number: [id number] Date of Birth/Sex: Treating RN: Feb 03, 1941 (83 y.o. [name] Primary Care Azalyn Sliwa: [name] Other Clinician: Referring [name]: Treating [name]/Extender: [name] in Treatment: 15 Encounter Discharge Information Items Post Procedure [address] [name] ([id number]) [id number] Page 3 of 14 Discharge Condition: Stable Temperature (F): 98.2 Ambulatory Status: Wheelchair Pulse (bpm): 72 Discharge Destination: Home Respiratory Rate (breaths/min): 18 Transportation: Private Auto Blood Pressure (mmHg): 134/57 Accompanied By: daughter in law Schedule Follow-up Appointment: Yes Clinical Summary of Care: Electronic Signature(s) Signed: 10/08/2023 5:04:08 PM By: Charles Aver MSN RN CNS WTA Entered By: [name] on 10/08/2023 17:04:08 -------------------------------------------------------------------------------- Lower Extremity Assessment Details Patient Name: Date of Service: [name], [name] 10/08/2023 3:45 PM Medical Record Number: [id number] Patient Account Number: [id number] Date of Birth/Sex: Treating RN: [birth year] (83 y.o. [name] Primary Care [name]: [name] Other Clinician: Referring [name]: Treating [name]/Extender: [name] in Treatment: 15 Electronic Signature(s) Signed: 10/08/2023 5:12:31 PM By:  Charles Aver MSN RN CNS WTA Entered By: [name] on 10/08/2023 16:13:30 -------------------------------------------------------------------------------- Multi Wound Chart Details Patient Name: Date of Service: [name], [place] RD [name] 10/08/2023 3:45 PM Medical Record Number: [id number] Patient Account Number: [id number] Date of Birth/Sex: Treating RN: 1940-12-29 (83 y.o. 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 Lower Primary Etiology: Extremity Extremity Congestive Heart Failure, Congestive Heart Failure, Congestive Heart Failure, Comorbid History: Hypertension, Peripheral Venous Hypertension, Peripheral Venous Hypertension, Peripheral Venous Disease, Type II Diabetes, Disease, Type II Diabetes, Disease, Type II Diabetes, Neuropathy Neuropathy Neuropathy 04/16/2023 06/16/2023 06/16/2023 Date Acquired: 15 15 15  Weeks of Treatment: Open Open Open Wound Status: No No No Wound Recurrence: 0.3x0.2x0.7 0.3x0.3x0.1 2x1x0.5 Measurements L x W x [name] (cm) 0.047 0.071 1.571 A (cm) : rea 0.033 0.007 0.785 Volume (cm) : 96.00% 81.60% 0.00% % Reduction in A rea: 97.80% 81.60% -400.00% % Reduction in Volume: 9 Starting Position 1 (o'clock): 11 Ending Position 1 (o'clock): 1.3 Maximum Distance 1 (cm): N/A N/A Yes Undermining: Full Thickness Without Exposed Grade 1 Grade  2 Classification: Support Structures Small Small Medium Exudate Amount: Serous Serous Serous Exudate Type: amber amber amber Exudate Color: No No Yes Foul Odor A Cleansing: fter N/A N/A No Odor Anticipated Due to Product Use: Large (67-100%) Medium (34-66%) None Present (0%) Granulation A mount: Red, Pink Red, Pink N/A Granulation Quality: None Present (0%) Small (1-33%) Large (67-100%) Necrotic Amount: Fat Layer (Subcutaneous Tissue): Yes Fat Layer (Subcutaneous Tissue): Yes Fascia: Yes Exposed Structures: Fascia: No Fascia: No Fat Layer (Subcutaneous Tissue): Yes Tendon: No Tendon: No Muscle: Yes Muscle: No Muscle: No Tendon: No Joint: No Joint: No Joint: No Bone: No Bone: No Bone: No None Small (1-33%) None Epithelialization: Debridement - Excisional Debridement -  Excisional Debridement - Excisional Debridement: Pre-procedure Verification/Time Out 16:21 16:21 16:21 Taken: Lidocaine 4% Topical Solution Lidocaine 4% Topical Solution Lidocaine 4% Topical Solution Pain Control: Subcutaneous, Slough Callus, Subcutaneous, Slough Muscle, Subcutaneous, Slough Tissue Debrided: Skin/Subcutaneous Tissue Skin/Subcutaneous Tissue Skin/Subcutaneous Tissue/Muscle Level: 0.05 0.07 1.57 Debridement A (sq cm): rea Curette Curette Forceps, Scissors Instrument: Minimum Minimum Minimum Bleeding: Pressure Pressure Pressure Hemostasis A chieved: 0 0 0 Procedural Pain: 0 0 0 Post Procedural Pain: Procedure was tolerated well Procedure was tolerated well Procedure was tolerated well Debridement Treatment Response: 0.3x0.2x0.7 0.3x0.3x0.1 2x1x0.5 Post Debridement Measurements L x W x [name] (cm) 0.033 0.007 0.785 Post Debridement Volume: (cm) Debridement Debridement Debridement Procedures Performed: Wound Number: 5 N/A N/A Photos: N/A N/A Right Calcaneus N/A N/A Wound Location: Gradually Appeared N/A N/A Wounding Event: Diabetic Wound/Ulcer of the Lower N/A  N/A Primary Etiology: Extremity Congestive Heart Failure, N/A N/A Comorbid History: Hypertension, Peripheral Venous Disease, Type II Diabetes, Neuropathy 09/16/2023 N/A N/A Date Acquired: 2 N/A N/A Weeks of TreatmentJAMESANDREW, KERNAGHAN ([id number]) [phone].pdf Page 5 of 14 Open N/A N/A Wound Status: No N/A N/A Wound Recurrence: 1.2x1.3x0.1 N/A N/A Measurements L x W x [name] (cm) 1.225 N/A N/A A (cm) : rea 0.123 N/A N/A Volume (cm) : -211.70% N/A N/A % Reduction in A rea: -215.40% N/A N/A % Reduction in Volume: N/A N/A N/A Undermining: Grade 3 N/A N/A Classification: Medium N/A N/A Exudate A mount: Serosanguineous N/A N/A Exudate Type: red, brown N/A N/A Exudate Color: No N/A N/A Foul Odor A Cleansing: fter N/A N/A N/A Odor A nticipated Due to Product Use: Medium (34-66%) N/A N/A Granulation A mount: Red, Pink N/A N/A Granulation Quality: Small (1-33%) N/A N/A Necrotic A mount: Fat Layer (Subcutaneous Tissue): Yes N/A N/A Exposed Structures: Fascia: No Tendon: No Muscle: No Joint: No Bone: No None N/A N/A Epithelialization: Debridement - Excisional N/A N/A Debridement: Pre-procedure Verification/Time Out 16:21 N/A N/A Taken: Lidocaine 4% Topical Solution N/A N/A Pain Control: Necrotic/Eschar, Subcutaneous, N/A N/A Tissue Debrided: Slough Skin/Subcutaneous Tissue N/A N/A Level: 0.61 N/A N/A Debridement A (sq cm): rea Curette, Forceps, Scissors N/A N/A Instrument: Minimum N/A N/A Bleeding: Pressure N/A N/A Hemostasis Achieved: 0 N/A N/A Procedural Pain: 0 N/A N/A Post Procedural Pain: Debridement Treatment Response: Procedure was tolerated well N/A N/A Post Debridement Measurements L x 1.2x1.3x0.1 N/A N/A W x [name] (cm) 0.123 N/A N/A Post Debridement Volume: (cm) Debridement N/A N/A Procedures Performed: Treatment Notes Wound #1 (Amputation Site - Toe) Wound Laterality: Right Cleanser Byram Ancillary Kit - 15  Day Supply Discharge Instruction: Use supplies as instructed; Kit contains: (15) Saline Bullets; (15) 3x3 Gauze; 15 pr Gloves Peri-Wound Care Topical Primary Dressing Gauze Discharge Instruction: As directed: dry, moistened with saline or moistened with Dakins Solution Hydrofera Blue Ready Transfer Foam, 2.5x2.5 (in/in) Discharge Instruction: Apply Hydrofera Blue Ready to wound bed as directed Secondary Dressing Conforming Guaze Roll-Large Discharge Instruction: Apply Conforming Stretch Guaze Bandage as directed Secured With Medipore T - 43M Medipore H Soft Cloth Surgical T ape ape, 2x2 (in/yd) Compression Wrap Compression Stockings Add-Ons Wound #2 (Foot) Wound Laterality: Plantar, Right Cleanser Soap and Water Discharge Instruction: Gently cleanse wound with antibacterial soap, rinse and pat dry prior to dressing wounds DEWAN, DEENEY [name] ([id number]) [phone].pdf Page 6 of 14 Peri-Wound Care Topical Primary Dressing Gauze Discharge Instruction: As directed: dry, moistened with saline or moistened with Dakins Solution Hydrofera Blue Ready Transfer Foam, 2.5x2.5 (in/in) Discharge Instruction: Apply Hydrofera Blue Ready to wound bed as directed Secondary  Dressing ABD Pad 5x9 (in/in) Discharge Instruction: Cover with ABD pad Secured With Medipore T - 10M Medipore H Soft Cloth Surgical T ape ape, 2x2 (in/yd) Kerlix Roll Sterile or Non-Sterile 6-ply 4.5x4 (yd/yd) Discharge Instruction: Apply Kerlix as directed Tubigrip Size E, 3.5x10 (in/yds) Discharge Instruction: Apply 3 Tubigrip E 3-finger-widths below knee to base of toes to secure dressing and/or for swelling. Compression Wrap Compression Stockings Add-Ons Wound #3 (Foot) Wound Laterality: Right, Lateral Cleanser Soap and Water Discharge Instruction: Gently cleanse wound with antibacterial soap, rinse and pat dry prior to dressing wounds Peri-Wound Care Topical Santyl Collagenase Ointment, 30  (gm), tube Discharge Instruction: apply nickel thick to wound bed only Primary Dressing Gauze Discharge Instruction: As directed: dry, moistened with saline or moistened with Dakins Solution Secondary Dressing ABD Pad 5x9 (in/in) Discharge Instruction: Cover with ABD pad Secured With Medipore T - 10M Medipore H Soft Cloth Surgical T ape ape, 2x2 (in/yd) Kerlix Roll Sterile or Non-Sterile 6-ply 4.5x4 (yd/yd) Discharge Instruction: Apply Kerlix as directed Tubigrip Size E, 3.5x10 (in/yds) Discharge Instruction: Apply 3 Tubigrip E 3-finger-widths below knee to base of toes to secure dressing and/or for swelling. Compression Wrap Compression Stockings Add-Ons Wound #5 (Calcaneus) Wound Laterality: Right Cleanser Soap and Water Discharge Instruction: Gently cleanse wound with antibacterial soap, rinse and pat dry prior to dressing wounds Vashe 5.8 (oz) Discharge Instruction: Use vashe 5.8 (oz) as directed Peri-Wound Care Topical Santyl Collagenase Ointment, 30 (gm), tube Discharge Instruction: apply nickel thick to wound bed only Primary Dressing ABE, REINE ([id number]) [phone].pdf Page 7 of 14 Hydrofera Blue Ready Transfer Foam, 2.5x2.5 (in/in) Discharge Instruction: Apply Hydrofera Blue Ready to wound bed as directed Secondary Dressing Kerlix 4.5 x 4.1 (in/yd) Discharge Instruction: Apply Kerlix 4.5 x 4.1 (in/yd) as instructed Secured With Compression Wrap Compression Stockings Add-Ons Electronic Signature(s) Signed: 10/08/2023 5:03:21 PM By: Charles Aver MSN RN CNS WTA Entered By: [name] on 10/08/2023 17:03:21 -------------------------------------------------------------------------------- Pain Assessment Details Patient Name: Date of Service: [name], [name] RD [name] 10/08/2023 3:45 PM Medical Record Number: [id number] Patient Account Number: [id number] Date of Birth/Sex: Treating RN: May 21, 1941 (83 y.o. [name] Primary Care  Jaiel Saraceno: [name] Other Clinician: Referring Eliberto Sole: Treating Falicity Sheets/Extender: [name] in Treatment: 15 Active Problems Location of Pain Severity and Description of Pain Patient Has Paino No Site Locations Pain Management and Medication Current Pain Management: Electronic Signature(s) Signed: 10/08/2023 5:12:31 PM By: Charles Aver MSN RN CNS WTA Entered By: [name] on 10/08/2023 15:56:06 Peter Congo [name] ([id number]) [id number] Page 8 of 14 -------------------------------------------------------------------------------- Patient/Caregiver Education Details Patient Name: Date of Service: [name], [name] 1/21/2025andnbsp3:45 PM Medical Record Number: [id number] Patient Account Number: [id number] Date of Birth/Gender: Treating RN: [birth year] (83 y.o. [name] Primary Care Physician: [name] Other Clinician: Referring Physician: Treating Physician/Extender: [name] in Treatment: 15 Education Assessment Education Provided To: Patient Education Topics Provided Wound Debridement: Handouts: Wound Debridement Methods: Explain/Verbal Responses: State content correctly Wound/Skin Impairment: Handouts: Caring for Your Ulcer Methods: Explain/Verbal Responses: State content correctly Electronic Signature(s) Signed: 10/08/2023 5:12:31 PM By: Charles Aver MSN RN CNS WTA Entered By: [name] on 10/08/2023 16:30:06 -------------------------------------------------------------------------------- Wound Assessment Details Patient Name: Date of Service: [name], [name] RD [name] 10/08/2023 3:45 PM Medical Record Number: [id number] Patient Account Number: [id number] Date of Birth/Sex: Treating RN: [birth year] (83 y.o. [name] Primary Care Camron Monday: [name] Other Clinician: Referring Concepcion Kirkpatrick: Treating Charletha Dalpe/Extender: Worthy Flank  Weeks in  Treatment: 15 Wound Status Wound Number: 1 Primary Dehisced Wound Etiology: Wound Location: Right Amputation Site - Toe Wound Open Wounding Event: Surgical Injury Status: Date Acquired: 04/16/2023 Comorbid Congestive Heart Failure, Hypertension, Peripheral Venous Weeks Of Treatment: 15 History: Disease, Type II Diabetes, Neuropathy Clustered Wound: No ZEE, VILL ([id number]) [phone].pdf Page 9 of 14 Photos Wound Measurements Length: (cm) 0.3 Width: (cm) 0.2 Depth: (cm) 0.7 Area: (cm) 0.047 Volume: (cm) 0.033 % Reduction in Area: 96% % Reduction in Volume: 97.8% Epithelialization: None Wound Description Classification: Full Thickness Without Exposed Support Structures Exudate Amount: Small Exudate Type: Serous Exudate Color: amber Foul Odor After Cleansing: No Slough/Fibrino No Wound Bed Granulation Amount: Large (67-100%) Exposed Structure Granulation Quality: Red, Pink Fascia Exposed: No Necrotic Amount: None Present (0%) Fat Layer (Subcutaneous Tissue) Exposed: Yes Tendon Exposed: No Muscle Exposed: No Joint Exposed: No Bone Exposed: No Treatment Notes Wound #1 (Amputation Site - Toe) Wound Laterality: Right Cleanser Byram Ancillary Kit - 15 Day Supply Discharge Instruction: Use supplies as instructed; Kit contains: (15) Saline Bullets; (15) 3x3 Gauze; 15 pr Gloves Peri-Wound Care Topical Primary Dressing Gauze Discharge Instruction: As directed: dry, moistened with saline or moistened with Dakins Solution Hydrofera Blue Ready Transfer Foam, 2.5x2.5 (in/in) Discharge Instruction: Apply Hydrofera Blue Ready to wound bed as directed Secondary Dressing Conforming Guaze Roll-Large Discharge Instruction: Apply Conforming Stretch Guaze Bandage as directed Secured With Medipore T - 47M Medipore H Soft Cloth Surgical T ape ape, 2x2 (in/yd) Compression Wrap Compression Stockings Facilities manager) Signed: 10/08/2023  5:12:31 PM By: Charles Aver MSN RN CNS WTA Entered By: [name] on 10/08/2023 16:11:41 Peter Congo [name] ([id number]) [id number] Page 10 of 14 -------------------------------------------------------------------------------- Wound Assessment Details Patient Name: Date of Service: [name], [name] 10/08/2023 3:45 PM Medical Record Number: [id number] Patient Account Number: [id number] Date of Birth/Sex: Treating RN: 08-01-1941 (83 y.o. [name] Primary Care Levent Kornegay: [name] Other Clinician: Referring Tarren Sabree: Treating Vitali Seibert/Extender: [name] in Treatment: 15 Wound Status Wound Number: 2 Primary Diabetic Wound/Ulcer of the Lower Extremity Etiology: Wound Location: Right, Plantar Foot Wound Open Wounding Event: Footwear Injury Status: Date Acquired: 06/16/2023 Comorbid Congestive Heart Failure, Hypertension, Peripheral Venous Weeks Of Treatment: 15 History: Disease, Type II Diabetes, Neuropathy Clustered Wound: No Photos Wound Measurements Length: (cm) 0.3 Width: (cm) 0.3 Depth: (cm) 0.1 Area: (cm) 0.071 Volume: (cm) 0.007 % Reduction in Area: 81.6% % Reduction in Volume: 81.6% Epithelialization: Small (1-33%) Wound Description Classification: Grade 1 Exudate Amount: Small Exudate Type: Serous Exudate Color: amber Foul Odor After Cleansing: No Slough/Fibrino No Wound Bed Granulation Amount: Medium (34-66%) Exposed Structure Granulation Quality: Red, Pink Fascia Exposed: No Necrotic Amount: Small (1-33%) Fat Layer (Subcutaneous Tissue) Exposed: Yes Necrotic Quality: Adherent Slough Tendon Exposed: No Muscle Exposed: No Joint Exposed: No Bone Exposed: No Treatment Notes Wound #2 (Foot) Wound Laterality: Plantar, Right Cleanser Soap and Water Discharge Instruction: Gently cleanse wound with antibacterial soap, rinse and pat dry prior to dressing wounds Peri-Wound Care LUCKIE, OKABE [name]  ([id number]) [id number] Page 11 of 14 Topical Primary Dressing Gauze Discharge Instruction: As directed: dry, moistened with saline or moistened with Dakins Solution Hydrofera Blue Ready Transfer Foam, 2.5x2.5 (in/in) Discharge Instruction: Apply Hydrofera Blue Ready to wound bed as directed Secondary Dressing ABD Pad 5x9 (in/in) Discharge Instruction: Cover with ABD pad Secured With Medipore T - 47M Medipore H Soft Cloth Surgical T ape ape, 2x2 (in/yd) Kerlix Roll Sterile or Non-Sterile 6-ply 4.5x4 (yd/yd)  Discharge Instruction: Apply Kerlix as directed Tubigrip Size E, 3.5x10 (in/yds) Discharge Instruction: Apply 3 Tubigrip E 3-finger-widths below knee to base of toes to secure dressing and/or for swelling. Compression Wrap Compression Stockings Add-Ons Electronic Signature(s) Signed: 10/08/2023 5:12:31 PM By: Charles Aver MSN RN CNS WTA Entered By: [name] on 10/08/2023 16:12:39 -------------------------------------------------------------------------------- Wound Assessment Details Patient Name: Date of Service: [name], [place] RD [name] 10/08/2023 3:45 PM Medical Record Number: [id number] Patient Account Number: [id number] Date of Birth/Sex: Treating RN: [birth year] (83 y.o. [name] Primary Care Coston Mandato: [name] Other Clinician: Referring Giani Betzold: Treating Sai Zinn/Extender: [name] in Treatment: 15 Wound Status Wound Number: 3 Primary Diabetic Wound/Ulcer of the Lower Extremity Etiology: Wound Location: Right, Lateral Foot Wound Open Wounding Event: Footwear Injury Status: Date Acquired: 06/16/2023 Comorbid Congestive Heart Failure, Hypertension, Peripheral Venous Weeks Of Treatment: 15 History: Disease, Type II Diabetes, Neuropathy Clustered Wound: No Photos TATEN, JAFFEE [name] ([id number]) [id number] Page 12 of 14 Wound Measurements Length: (cm) 2 Width: (cm) 1 Depth:  (cm) 0.5 Area: (cm) 1.571 Volume: (cm) 0.785 % Reduction in Area: 0% % Reduction in Volume: -400% Epithelialization: None Undermining: Yes Starting Position (o'clock): 9 Ending Position (o'clock): 11 Maximum Distance: (cm) 1.3 Wound Description Classification: Grade 2 Exudate Amount: Medium Exudate Type: Serous Exudate Color: amber Foul Odor After Cleansing: Yes Due to Product Use: No Slough/Fibrino Yes Wound Bed Granulation Amount: None Present (0%) Exposed Structure Necrotic Amount: Large (67-100%) Fascia Exposed: Yes Necrotic Quality: Adherent Slough Fat Layer (Subcutaneous Tissue) Exposed: Yes Tendon Exposed: No Muscle Exposed: Yes Necrosis of Muscle: Yes Joint Exposed: No Bone Exposed: No Treatment Notes Wound #3 (Foot) Wound Laterality: Right, Lateral Cleanser Soap and Water Discharge Instruction: Gently cleanse wound with antibacterial soap, rinse and pat dry prior to dressing wounds Peri-Wound Care Topical Santyl Collagenase Ointment, 30 (gm), tube Discharge Instruction: apply nickel thick to wound bed only Primary Dressing Gauze Discharge Instruction: As directed: dry, moistened with saline or moistened with Dakins Solution Secondary Dressing ABD Pad 5x9 (in/in) Discharge Instruction: Cover with ABD pad Secured With Medipore T - 53M Medipore H Soft Cloth Surgical T ape ape, 2x2 (in/yd) Kerlix Roll Sterile or Non-Sterile 6-ply 4.5x4 (yd/yd) Discharge Instruction: Apply Kerlix as directed Tubigrip Size E, 3.5x10 (in/yds) Discharge Instruction: Apply 3 Tubigrip E 3-finger-widths below knee to base of toes to secure dressing and/or for swelling. Compression Wrap Compression Stockings Add-Ons Electronic Signature(s) Signed: 10/08/2023 5:12:31 PM By: Charles Aver MSN RN CNS WTA Entered By: [name] on 10/08/2023 16:11:19 TAYMAR, NIVAR [name] ([id number]) [phone].pdf Page 13 of  14 -------------------------------------------------------------------------------- Wound Assessment Details Patient Name: Date of Service: [name], [name] 10/08/2023 3:45 PM Medical Record Number: [id number] Patient Account Number: [id number] Date of Birth/Sex: Treating RN: 1940/09/25 (83 y.o. [name] Primary Care Kemberly Taves: [name] Other Clinician: Referring Jayleon Mcfarlane: Treating Sierah Lacewell/Extender: [name] in Treatment: 15 Wound Status Wound Number: 5 Primary Diabetic Wound/Ulcer of the Lower Extremity Etiology: Wound Location: Right Calcaneus Wound Open Wounding Event: Gradually Appeared Status: Date Acquired: 09/16/2023 Comorbid Congestive Heart Failure, Hypertension, Peripheral Venous Weeks Of Treatment: 2 History: Disease, Type II Diabetes, Neuropathy Clustered Wound: No Photos Wound Measurements Length: (cm) 1.2 Width: (cm) 1.3 Depth: (cm) 0.1 Area: (cm) 1.225 Volume: (cm) 0.123 % Reduction in Area: -211.7% % Reduction in Volume: -215.4% Epithelialization: None Wound Description Classification: Grade 3 Exudate Amount: Medium Exudate Type: Serosanguineous Exudate Color: red, brown Foul Odor After Cleansing: No Slough/Fibrino Yes  Wound Bed Granulation Amount: Medium (34-66%) Exposed Structure Granulation Quality: Red, Pink Fascia Exposed: No Necrotic Amount: Small (1-33%) Fat Layer (Subcutaneous Tissue) Exposed: Yes Necrotic Quality: Adherent Slough Tendon Exposed: No Muscle Exposed: No Joint Exposed: No Bone Exposed: No Treatment Notes Wound #5 (Calcaneus) Wound Laterality: Right Cleanser Soap and Water Discharge Instruction: Gently cleanse wound with antibacterial soap, rinse and pat dry prior to dressing wounds Vashe 5.8 (oz) NYKEL, WIBBENMEYER [name] ([id number]) [phone].pdf Page 14 of 14 Discharge Instruction: Use vashe 5.8 (oz) as directed Peri-Wound Care Topical Santyl Collagenase  Ointment, 30 (gm), tube Discharge Instruction: apply nickel thick to wound bed only Primary Dressing Hydrofera Blue Ready Transfer Foam, 2.5x2.5 (in/in) Discharge Instruction: Apply Hydrofera Blue Ready to wound bed as directed Secondary Dressing Kerlix 4.5 x 4.1 (in/yd) Discharge Instruction: Apply Kerlix 4.5 x 4.1 (in/yd) as instructed Secured With Compression Wrap Compression Stockings Add-Ons Electronic Signature(s) Signed: 10/08/2023 5:12:31 PM By: Charles Aver MSN RN CNS WTA Entered By: [name] on 10/08/2023 16:13:19 -------------------------------------------------------------------------------- Vitals Details Patient Name: Date of Service: [name], [place] RD [name] 10/08/2023 3:45 PM Medical Record Number: [id number] Patient Account Number: [id number] Date of Birth/Sex: Treating RN: [birth year] (83 y.o. [name] Primary Care Ronnald Shedden: [name] Other Clinician: Referring Eluzer Howdeshell: Treating Harlon Kutner/Extender: [name] in Treatment: 15 Vital Signs Time Taken: 15:51 Temperature (F): 98.2 Height (in): 68 Pulse (bpm): 72 Weight (lbs): 167 Respiratory Rate (breaths/min): 18 Body Mass Index (BMI): 25.4 Blood Pressure (mmHg): 134/57 Reference Range: 80 - 120 mg / dl Electronic Signature(s) Signed: 10/08/2023 5:12:31 PM By: Charles Aver MSN RN CNS WTA Entered By: [name] on 10/08/2023 15:55:57

## 2023-10-10 ENCOUNTER — Encounter (INDEPENDENT_AMBULATORY_CARE_PROVIDER_SITE_OTHER): Payer: Self-pay | Admitting: Vascular Surgery

## 2023-10-10 ENCOUNTER — Telehealth (INDEPENDENT_AMBULATORY_CARE_PROVIDER_SITE_OTHER): Payer: Self-pay

## 2023-10-10 ENCOUNTER — Ambulatory Visit (INDEPENDENT_AMBULATORY_CARE_PROVIDER_SITE_OTHER): Payer: Medicare Other | Admitting: Vascular Surgery

## 2023-10-10 VITALS — BP 134/76 | HR 82 | Resp 18 | Ht 68.0 in | Wt 168.0 lb

## 2023-10-10 DIAGNOSIS — I10 Essential (primary) hypertension: Secondary | ICD-10-CM | POA: Diagnosis not present

## 2023-10-10 DIAGNOSIS — I25119 Atherosclerotic heart disease of native coronary artery with unspecified angina pectoris: Secondary | ICD-10-CM

## 2023-10-10 DIAGNOSIS — E782 Mixed hyperlipidemia: Secondary | ICD-10-CM

## 2023-10-10 DIAGNOSIS — I70213 Atherosclerosis of native arteries of extremities with intermittent claudication, bilateral legs: Secondary | ICD-10-CM | POA: Diagnosis not present

## 2023-10-10 DIAGNOSIS — E1152 Type 2 diabetes mellitus with diabetic peripheral angiopathy with gangrene: Secondary | ICD-10-CM | POA: Diagnosis not present

## 2023-10-10 NOTE — Telephone Encounter (Signed)
Spoke with the patient's daughter and he is scheduled with Dr. Gilda Crease on 10/11/23 for a right leg angio with intervention at the [facility], 10:00 am arrival time. Pre-procedure instructions were discussed and will be sent to Mychart.

## 2023-10-11 ENCOUNTER — Ambulatory Visit
Admission: RE | Admit: 2023-10-11 | Discharge: 2023-10-11 | Disposition: A | Discharge status: Home / Self Care | Payer: Medicare Other | Attending: Vascular Surgery | Admitting: Vascular Surgery

## 2023-10-11 ENCOUNTER — Other Ambulatory Visit: Payer: Self-pay

## 2023-10-11 ENCOUNTER — Encounter: Payer: Self-pay | Admitting: Vascular Surgery

## 2023-10-11 ENCOUNTER — Encounter
Admission: RE | Disposition: A | Discharge status: Home / Self Care | Payer: Self-pay | Source: Home / Self Care | Attending: Vascular Surgery

## 2023-10-11 DIAGNOSIS — T8189XA Other complications of procedures, not elsewhere classified, initial encounter: Secondary | ICD-10-CM

## 2023-10-11 DIAGNOSIS — I700 Atherosclerosis of aorta: Secondary | ICD-10-CM

## 2023-10-11 DIAGNOSIS — L97909 Non-pressure chronic ulcer of unspecified part of unspecified lower leg with unspecified severity: Secondary | ICD-10-CM

## 2023-10-11 DIAGNOSIS — L97519 Non-pressure chronic ulcer of other part of right foot with unspecified severity: Secondary | ICD-10-CM | POA: Insufficient documentation

## 2023-10-11 DIAGNOSIS — I70223 Atherosclerosis of native arteries of extremities with rest pain, bilateral legs: Secondary | ICD-10-CM | POA: Insufficient documentation

## 2023-10-11 DIAGNOSIS — I70235 Atherosclerosis of native arteries of right leg with ulceration of other part of foot: Secondary | ICD-10-CM | POA: Insufficient documentation

## 2023-10-11 HISTORY — PX: LOWER EXTREMITY ANGIOGRAPHY: CATH118251

## 2023-10-11 LAB — GLUCOSE, CAPILLARY
Glucose-Capillary: 101 mg/dL — ABNORMAL HIGH (ref 70–99)
Glucose-Capillary: 105 mg/dL — ABNORMAL HIGH (ref 70–99)

## 2023-10-11 LAB — CREATININE, SERUM
Creatinine, Ser: 1.03 mg/dL (ref 0.61–1.24)
GFR, Estimated: >60 mL/min (ref >60)

## 2023-10-11 LAB — BUN: BUN: 33 mg/dL — ABNORMAL HIGH (ref 8–23)

## 2023-10-11 SURGERY — LOWER EXTREMITY ANGIOGRAPHY
Anesthesia: Moderate Sedation | Site: Leg Lower | Laterality: Right

## 2023-10-11 MED ORDER — OXYCODONE HCL 5 MG PO TABS: 5.0000 mg | ORAL_TABLET | ORAL | Status: DC | PRN

## 2023-10-11 MED ORDER — CLOPIDOGREL BISULFATE 75 MG PO TABS
150.0000 mg | ORAL_TABLET | ORAL | Status: AC
  Administered 2023-10-11: 150 mg via ORAL

## 2023-10-11 MED ORDER — FAMOTIDINE 20 MG PO TABS: 40.0000 mg | ORAL_TABLET | Freq: Once | ORAL | Status: DC | PRN

## 2023-10-11 MED ORDER — HYDROMORPHONE HCL 1 MG/ML IJ SOLN: 0.5000 mg | INTRAMUSCULAR | Status: DC | PRN

## 2023-10-11 MED ORDER — MIDAZOLAM HCL 2 MG/ML PO SYRP: 8.0000 mg | ORAL_SOLUTION | Freq: Once | ORAL | Status: DC | PRN

## 2023-10-11 MED ORDER — ASPIRIN 81 MG PO TBEC
81.0000 mg | DELAYED_RELEASE_TABLET | ORAL | Status: AC
  Administered 2023-10-11: 81 mg via ORAL

## 2023-10-11 MED ORDER — HEPARIN (PORCINE) IN NACL 1000-0.9 UT/500ML-% IV SOLN
INTRAVENOUS | Status: DC | PRN
  Administered 2023-10-11: 1500 mL

## 2023-10-11 MED ORDER — FENTANYL CITRATE PF 50 MCG/ML IJ SOSY
PREFILLED_SYRINGE | INTRAMUSCULAR | Status: AC
  Filled 2023-10-11: qty 1

## 2023-10-11 MED ORDER — CEFAZOLIN SODIUM-DEXTROSE 2-4 GM/100ML-% IV SOLN
INTRAVENOUS | Status: AC
  Filled 2023-10-11: qty 100

## 2023-10-11 MED ORDER — CLOPIDOGREL BISULFATE 75 MG PO TABS
ORAL_TABLET | ORAL | Status: AC
  Filled 2023-10-11: qty 2

## 2023-10-11 MED ORDER — MIDAZOLAM HCL 2 MG/2ML IJ SOLN
INTRAMUSCULAR | Status: DC | PRN
  Administered 2023-10-11: .5 mg via INTRAVENOUS
  Administered 2023-10-11: 1 mg via INTRAVENOUS
  Administered 2023-10-11: .5 mg via INTRAVENOUS
  Administered 2023-10-11 (×2): 1 mg via INTRAVENOUS

## 2023-10-11 MED ORDER — DIPHENHYDRAMINE HCL 50 MG/ML IJ SOLN: 50.0000 mg | Freq: Once | INTRAMUSCULAR | Status: DC | PRN

## 2023-10-11 MED ORDER — HYDROMORPHONE HCL 1 MG/ML IJ SOLN
INTRAMUSCULAR | Status: AC
  Filled 2023-10-11: qty 1

## 2023-10-11 MED ORDER — LIDOCAINE HCL (PF) 1 % IJ SOLN
INTRAMUSCULAR | Status: DC | PRN
  Administered 2023-10-11: 10 mL via INTRADERMAL

## 2023-10-11 MED ORDER — NITROGLYCERIN 1 MG/10 ML FOR IR/CATH LAB
INTRA_ARTERIAL | Status: DC | PRN
  Administered 2023-10-11: 400 ug via INTRA_ARTERIAL

## 2023-10-11 MED ORDER — HYDRALAZINE HCL 20 MG/ML IJ SOLN: 5.0000 mg | INTRAMUSCULAR | Status: DC | PRN

## 2023-10-11 MED ORDER — LABETALOL HCL 5 MG/ML IV SOLN: 10.0000 mg | INTRAVENOUS | Status: DC | PRN

## 2023-10-11 MED ORDER — HYDROMORPHONE HCL 1 MG/ML IJ SOLN
1.0000 mg | Freq: Once | INTRAMUSCULAR | Status: AC | PRN
  Administered 2023-10-11: 1 mg via INTRAVENOUS

## 2023-10-11 MED ORDER — SODIUM CHLORIDE 0.9 % IV SOLN: INTRAVENOUS | Status: DC

## 2023-10-11 MED ORDER — HEPARIN SODIUM (PORCINE) 1000 UNIT/ML IJ SOLN
INTRAMUSCULAR | Status: AC
  Filled 2023-10-11: qty 10

## 2023-10-11 MED ORDER — SODIUM CHLORIDE 0.9 % IV SOLN
INTRAVENOUS | Status: DC
  Administered 2023-10-11: 500 mL via INTRAVENOUS

## 2023-10-11 MED ORDER — METHYLPREDNISOLONE SODIUM SUCC 125 MG IJ SOLR: 125.0000 mg | Freq: Once | INTRAMUSCULAR | Status: DC | PRN

## 2023-10-11 MED ORDER — NITROGLYCERIN 1 MG/10 ML FOR IR/CATH LAB
INTRA_ARTERIAL | Status: AC
  Filled 2023-10-11: qty 10

## 2023-10-11 MED ORDER — ONDANSETRON HCL 4 MG/2ML IJ SOLN: 4.0000 mg | Freq: Four times a day (QID) | INTRAMUSCULAR | Status: DC | PRN

## 2023-10-11 MED ORDER — ASPIRIN 81 MG PO TBEC
DELAYED_RELEASE_TABLET | ORAL | Status: AC
  Filled 2023-10-11: qty 1

## 2023-10-11 MED ORDER — CLOPIDOGREL BISULFATE 300 MG PO TABS: 300.0000 mg | ORAL_TABLET | ORAL | Status: DC

## 2023-10-11 MED ORDER — FENTANYL CITRATE (PF) 100 MCG/2ML IJ SOLN
INTRAMUSCULAR | Status: DC | PRN
  Administered 2023-10-11: 50 ug via INTRAVENOUS
  Administered 2023-10-11 (×4): 25 ug via INTRAVENOUS

## 2023-10-11 MED ORDER — SODIUM CHLORIDE 0.9 % IV SOLN: 250.0000 mL | INTRAVENOUS | Status: DC | PRN

## 2023-10-11 MED ORDER — IODIXANOL 320 MG/ML IV SOLN
INTRAVENOUS | Status: DC | PRN
  Administered 2023-10-11: 105 mL via INTRA_ARTERIAL

## 2023-10-11 MED ORDER — MIDAZOLAM HCL 5 MG/5ML IJ SOLN
INTRAMUSCULAR | Status: AC
  Filled 2023-10-11: qty 5

## 2023-10-11 MED ORDER — CEFAZOLIN SODIUM-DEXTROSE 2-4 GM/100ML-% IV SOLN
2.0000 g | INTRAVENOUS | Status: AC
Start: 2023-10-11 — End: 2023-10-11
  Administered 2023-10-11: 2 g via INTRAVENOUS

## 2023-10-11 MED ORDER — FENTANYL CITRATE (PF) 100 MCG/2ML IJ SOLN
INTRAMUSCULAR | Status: AC
  Filled 2023-10-11: qty 2

## 2023-10-11 MED ORDER — ACETAMINOPHEN 325 MG PO TABS: 650.0000 mg | ORAL_TABLET | ORAL | Status: DC | PRN

## 2023-10-11 MED ORDER — SODIUM CHLORIDE 0.9% FLUSH: 3.0000 mL | INTRAVENOUS | Status: DC | PRN

## 2023-10-11 MED ORDER — HEPARIN SODIUM (PORCINE) 1000 UNIT/ML IJ SOLN
INTRAMUSCULAR | Status: DC | PRN
  Administered 2023-10-11: 6000 [IU] via INTRAVENOUS

## 2023-10-11 MED ORDER — SODIUM CHLORIDE 0.9% FLUSH: 3.0000 mL | Freq: Two times a day (BID) | INTRAVENOUS | Status: DC

## 2023-10-11 SURGICAL SUPPLY — 43 items
BALLN ARMADA 2X100X150 (BALLOONS) ×1
BALLN DORADO 4X100X135 (BALLOONS) ×1
BALLN LUTONIX 018 4X60X130 (BALLOONS) ×1
BALLN LUTONIX 018 5X80X130 (BALLOONS) ×1
BALLN ULTRVRSE 3X150X150 (BALLOONS) ×1
BALLN ~~LOC~~ TREK NEO RX 3.0X20 (BALLOONS) ×1
BALLN ~~LOC~~ TREK NEO RX 3.5X20 (BALLOONS) ×1
BALLN ~~LOC~~ TREK NEO RX 3.75X15 (BALLOONS) ×1
BALLN ~~LOC~~ TREK NEO RX 4.0X12 (BALLOONS) ×1
BALLOON ARMADA 2X100X150 (BALLOONS) IMPLANT
BALLOON DORADO 4X100X135 (BALLOONS) IMPLANT
BALLOON LUTONIX 018 4X60X130 (BALLOONS) IMPLANT
BALLOON LUTONIX 018 5X80X130 (BALLOONS) IMPLANT
BALLOON ULTRVRSE 3X150X150 (BALLOONS) IMPLANT
BALLOON ~~LOC~~ TREK NEO RX 3.0X20 (BALLOONS) IMPLANT
BALLOON ~~LOC~~ TREK NEO RX 3.5X20 (BALLOONS) IMPLANT
BALLOON ~~LOC~~ TREK NEO RX 3.75X15 (BALLOONS) IMPLANT
BALLOON ~~LOC~~ TREK NEO RX 4.0X12 (BALLOONS) IMPLANT
CATH ANGIO 5F PIGTAIL 65CM (CATHETERS) IMPLANT
CATH BEACON 5 .038 100 VERT TP (CATHETERS) IMPLANT
CATH CXI SUPP ST 4FR 135CM (CATHETERS) IMPLANT
CATH SEEKER .035X135CM (CATHETERS) IMPLANT
COVER PROBE ULTRASOUND 5X96 (MISCELLANEOUS) IMPLANT
DEVICE PRESTO INFLATION (MISCELLANEOUS) IMPLANT
DEVICE STARCLOSE SE CLOSURE (Vascular Products) IMPLANT
DEVICE TORQUE (MISCELLANEOUS) IMPLANT
GLIDEWIRE ADV .035X260CM (WIRE) IMPLANT
GLIDEWIRE ANGLED SS 035X260CM (WIRE) IMPLANT
GOWN STRL REUS W/ TWL LRG LVL3 (GOWN DISPOSABLE) ×1 IMPLANT
GUIDEWIRE PFTE-COATED .018X300 (WIRE) IMPLANT
NDL ENTRY 21GA 7CM ECHOTIP (NEEDLE) IMPLANT
NEEDLE ENTRY 21GA 7CM ECHOTIP (NEEDLE) ×1
PACK ANGIOGRAPHY (CUSTOM PROCEDURE TRAY) ×1 IMPLANT
SET INTRO CAPELLA COAXIAL (SET/KITS/TRAYS/PACK) IMPLANT
SHEATH BRITE TIP 5FRX11 (SHEATH) IMPLANT
SHEATH RAABE 6FRX70 (SHEATH) IMPLANT
STENT LIFESTENT 5F 6X120X135 (Permanent Stent) IMPLANT
STENT ONYX FRONTIER 3.5X30 (Permanent Stent) IMPLANT
STENT VIABAHN 5X50X120 (Permanent Stent) IMPLANT
SYR MEDRAD MARK 7 150ML (SYRINGE) IMPLANT
TUBING CONTRAST HIGH PRESS 72 (TUBING) IMPLANT
WIRE GUIDERIGHT .035X150 (WIRE) IMPLANT
WIRE RUNTHROUGH .014X300CM (WIRE) IMPLANT

## 2023-10-11 NOTE — Discharge Instructions (Signed)
They will call you Monday for follow up appointment in 1 month with ABI's

## 2023-10-11 NOTE — Op Note (Signed)
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  Post-operative diagnosis:  Same  Procedure(s) Performed:             1.  Introduction catheter into right lower extremity 3rd order catheter placement               2.    Contrast injection right lower extremity for distal runoff             3.  Percutaneous transluminal angioplasty and stent placement right popliteal arteries to 5 mm             4.  Percutaneous transluminal angioplasty and stent placement tibioperoneal trunk and peroneal to 4mm             5.  Star close closure left common femoral arteriotomy  Anesthesia: Conscious sedation was administered under my direct supervision by the interventional radiology RN. IV Versed plus fentanyl were utilized. Continuous ECG, pulse oximetry and blood pressure was monitored throughout the entire procedure.  Conscious sedation was for a total of 111.  Sheath: 6 [place] Rabie  Contrast: 105 cc  Fluoroscopy Time: 26 minutes  Indications:  Cammy Brochure presents with increasing pain of the right lower extremity.  He is describing classic rest pain symptoms and has multiple ulcerations of his right foot.  Physical examination and noninvasive studies support profound atherosclerotic occlusive disease.  This suggests the patient is having limb threatening ischemia.  Angiography with the hope for intervention is recommended for limb salvage the risks and benefits are reviewed all questions answered patient agrees to proceed.  Procedure:   BASSEL GASKILL is a 83 y.o. y.o. male who was identified and appropriate procedural time out was performed.  The patient was then placed supine on the table and prepped and draped in the usual sterile fashion.     Ultrasound was placed in the sterile sleeve and the left groin was evaluated the left common femoral artery was echolucent and pulsatile indicating patency.  Image was recorded for the permanent record and under real-time visualization a microneedle was inserted into the common femoral artery followed by the microwire and then the micro-sheath.  A J-wire was then advanced through the micro-sheath and a  5 [place] sheath was then inserted over a J-wire. J-wire was then advanced and a 5 French pigtail catheter was positioned at the level of T12.  AP projection of the aorta was then obtained. Pigtail catheter was repositioned to above the bifurcation and a LAO view of the pelvis was obtained.  Subsequently a pigtail catheter with an Advantage wire was used to cross the aortic bifurcation.  The catheter and wire were advanced down into the right distal external iliac artery. Oblique view of the femoral bifurcation was then obtained and subsequently the wire was reintroduced and the pigtail catheter negotiated into the SFA representing third order catheter placement. Distal runoff was then performed.  6000 units of heparin was then given and allowed to circulate for several minutes.  A 70 cm 6 [place] Rabie sheath was advanced up and over the bifurcation and positioned in the distal femoral artery  KMP catheter and advantage Glidewire were then negotiated down into the distal popliteal. Catheter was then advanced. Hand injection contrast demonstrated the trifurcation and proximal tibial anatomy in further detail.  This demonstrates 3 greater than 70% stenoses in the mid to distal popliteal and then an abrupt occlusion distal to the origin of the anterior tibial artery this occlusion extends into the peroneal artery which is reconstituted approximately 10 cm distally.  The anterior tibial artery occludes just 1 cm distal to the origin and remains occluded throughout its entire course.  The posterior tibial artery is  occluded from its origin all the way down to the foot.  There is no significant reconstitution of the anterior tibial or posterior tibial.  Peroneal does collateralized across the ankle.    Working with a combination of the advantage wire as well as a stiff angled Glidewire in association with a 0.018 advantage wire and a Kumpe catheter seeker catheter and Nava cross catheter I was able to cross the occlusion and reenter the peroneal artery.  This was verified by hand-injection.  A 3 mm x 100 mm [place] balloon was used to angioplasty the peroneal artery as well as the tibioperoneal trunk.  The inflation was for 1 minute at 12 atm. Follow-up imaging demonstrated patency with greater than 70% stenosis in multiple areas.  After several different magnified images I selected a 6 mm x 120 mm life stent and deployed this so that the distal end of the stent was just past the distal popliteal.  I then dilated the distal portion of the life stent with a 4 mm x 100 mm Lutonix drug-eluting balloon inflated to 10 atm for 1 minute.  Next the proximal end of the life stent was dilated with a 5 mm x 120 mm Lutonix drug-eluting balloon inflated to 10 atm for 1 minute.  Follow-up imaging demonstrated excellent treatment of the distal SFA popliteal arteries and attention was turned to the tibioperoneal trunk and peroneal.  The detector was then positioned distally. A 3 mm x 100 mm balloon was advanced into the peroneal.  Inflation was to 12 atmospheres for 1 minute.  Imaging by hand-injection through the sheath demonstrated approximately 20 mm distal to the life stent there is a very focal napkin ring like lesion which did not expand.  At this point I felt that a drug-eluting coronary stent would be the optimal treatment given the resilience of this lesion to dilatation.  I selected a drug-eluting 3 mm x 25 mm stent and deployed this such that the napkin ringlike lesion was captured and the stent extended back to overlap by a  few millimeters with the life stent.  Inflation was to approximately 30 atm.  Upon follow-up imaging the napkin ringlike lesion was persistent.  I therefore began serial dilatation using high-pressure coronary balloons beginning with a 3.5 mm balloon and then a 3.75 mm balloon and finally a 4 mm x 15 mm balloon.  All 3 of these balloons were inflated to 30 to 36 atm.  In the end however this lesion persisted.  Also I had created an AV fistula.  Since I had already ballooned this area to 4 mm I then proceeded with a 5 mm x 2.5 cm Viabahn which I placed across the lesion.  I then utilized a 4 mm [place] balloon which was inflated to 38 atm at which point the lesion yielded with a bit of a pop.  Follow-up imaging now demonstrated wide patency with less than 10% residual stenosis throughout the tibioperoneal trunk and peroneal at this location.  Approximately 4 cm distal to this lesion there was a second lesion which I treated with a 3 mm  balloon inflation to 16 atm for 1 minute with excellent result and less than 10% residual stenosis.  Follow-up imaging demonstrated patency throughout the SFA popliteal and peroneal with less than 10% residual stenosis.   After review of these images the sheath is pulled into the left external iliac oblique of the common femoral is obtained and a Star close device deployed. There no immediate Complications.  Findings:  The abdominal aorta is opacified with a bolus injection contrast. Renal arteries are single and widely patent without evidence of hemodynamically significant stenosis.  The aorta itself has diffuse disease but no hemodynamically significant lesions. The common and external iliac arteries are widely patent bilaterally.  The left common femoral is widely patent as is the profunda femoris.  The SFA does not have a significant stenosis until the area of Hunter's canal and the popliteal artery.  This demonstrates 3 greater than 70% stenoses in the mid to distal popliteal  and then an abrupt occlusion distal to the origin of the anterior tibial artery this occlusion extends into the peroneal artery which is reconstituted approximately 10 cm distally.  The anterior tibial artery occludes just 1 cm distal to the origin and remains occluded throughout its entire course.  The posterior tibial artery is occluded from its origin all the way down to the foot.  There is no significant reconstitution of the anterior tibial or posterior tibial.  Peroneal does collateralized across the ankle.  Following angioplasty and stent placement as described above tibial peroneal trunk and peroneal there is now in-line flow with less than 10% residual stenosis. Angioplasty and stent placement of the popliteal artery as described above yields an excellent result with less than 10% residual stenosis.  Summary: Successful recanalization right lower extremity for limb salvage                           Disposition: Patient was taken to the recovery room in stable condition having tolerated the procedure well.  Earl Lites Tanajah Boulter 10/11/2023,1:57 PM

## 2023-10-14 ENCOUNTER — Encounter: Payer: Self-pay | Admitting: Vascular Surgery

## 2023-10-15 ENCOUNTER — Ambulatory Visit
Admission: RE | Admit: 2023-10-15 | Discharge: 2023-10-15 | Disposition: A | Discharge status: Home / Self Care | Payer: Medicare Other | Source: Ambulatory Visit | Attending: Physician Assistant | Admitting: Physician Assistant

## 2023-10-15 ENCOUNTER — Other Ambulatory Visit: Payer: Self-pay | Admitting: Physician Assistant

## 2023-10-15 ENCOUNTER — Encounter: Payer: Medicare Other | Admitting: Physician Assistant

## 2023-10-15 DIAGNOSIS — M869 Osteomyelitis, unspecified: Secondary | ICD-10-CM | POA: Diagnosis present

## 2023-10-15 DIAGNOSIS — E11621 Type 2 diabetes mellitus with foot ulcer: Secondary | ICD-10-CM | POA: Diagnosis not present

## 2023-10-17 ENCOUNTER — Other Ambulatory Visit: Payer: Self-pay | Admitting: Physician Assistant

## 2023-10-17 DIAGNOSIS — L97513 Non-pressure chronic ulcer of other part of right foot with necrosis of muscle: Secondary | ICD-10-CM

## 2023-10-17 DIAGNOSIS — E11621 Type 2 diabetes mellitus with foot ulcer: Secondary | ICD-10-CM

## 2023-10-22 ENCOUNTER — Encounter: Payer: Medicare Other | Attending: Physician Assistant | Admitting: Physician Assistant

## 2023-10-22 DIAGNOSIS — X58XXXA Exposure to other specified factors, initial encounter: Secondary | ICD-10-CM | POA: Insufficient documentation

## 2023-10-22 DIAGNOSIS — L97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 ulcer of other part of right foot with fat layer exposed: Secondary | ICD-10-CM | POA: Diagnosis not present

## 2023-10-22 DIAGNOSIS — T8131XA Disruption of external operation (surgical) wound, not elsewhere classified, initial encounter: Secondary | ICD-10-CM | POA: Insufficient documentation

## 2023-10-22 DIAGNOSIS — L97513 Non-pressure chronic ulcer of other part of right foot with necrosis of muscle: Secondary | ICD-10-CM | POA: Diagnosis not present

## 2023-10-22 DIAGNOSIS — I73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: Secondary | ICD-10-CM | POA: Insufficient documentation

## 2023-10-22 DIAGNOSIS — Z7901 Long term (current) use of anticoagulants: Secondary | ICD-10-CM | POA: Insufficient documentation

## 2023-10-22 DIAGNOSIS — M86371 Chronic multifocal osteomyelitis, right ankle and foot: Secondary | ICD-10-CM | POA: Diagnosis not present

## 2023-10-22 DIAGNOSIS — E1122 Type 2 diabetes mellitus with diabetic chronic kidney disease: Secondary | ICD-10-CM | POA: Diagnosis not present

## 2023-10-22 DIAGNOSIS — I129 Hypertensive chronic kidney disease with stage 1 through stage 4 chronic kidney disease, or unspecified chronic kidney disease: Secondary | ICD-10-CM | POA: Insufficient documentation

## 2023-10-22 DIAGNOSIS — E11621 Type 2 diabetes mellitus with foot ulcer: Secondary | ICD-10-CM | POA: Insufficient documentation

## 2023-10-22 DIAGNOSIS — E1169 Type 2 diabetes mellitus with other specified complication: Secondary | ICD-10-CM | POA: Insufficient documentation

## 2023-10-22 DIAGNOSIS — N183 Chronic kidney disease, stage 3 unspecified: Secondary | ICD-10-CM | POA: Insufficient documentation

## 2023-10-22 DIAGNOSIS — I2510 Atherosclerotic heart disease of native coronary artery without angina pectoris: Secondary | ICD-10-CM | POA: Insufficient documentation

## 2023-10-23 ENCOUNTER — Ambulatory Visit
Admission: RE | Admit: 2023-10-23 | Discharge: 2023-10-23 | Disposition: A | Discharge status: Home / Self Care | Payer: Medicare Other | Source: Ambulatory Visit | Attending: Physician Assistant | Admitting: Physician Assistant

## 2023-10-23 DIAGNOSIS — E1169 Type 2 diabetes mellitus with other specified complication: Secondary | ICD-10-CM | POA: Insufficient documentation

## 2023-10-23 DIAGNOSIS — L97513 Non-pressure chronic ulcer of other part of right foot with necrosis of muscle: Secondary | ICD-10-CM | POA: Diagnosis present

## 2023-10-23 DIAGNOSIS — L97509 Non-pressure chronic ulcer of other part of unspecified foot with unspecified severity: Secondary | ICD-10-CM | POA: Insufficient documentation

## 2023-10-23 DIAGNOSIS — E11621 Type 2 diabetes mellitus with foot ulcer: Secondary | ICD-10-CM | POA: Insufficient documentation

## 2023-10-23 DIAGNOSIS — M869 Osteomyelitis, unspecified: Secondary | ICD-10-CM | POA: Insufficient documentation

## 2023-10-23 MED ORDER — GADOBUTROL 1 MMOL/ML IV SOLN
6.0000 mL | Freq: Once | INTRAVENOUS | Status: AC | PRN
  Administered 2023-10-23: 6 mL via INTRAVENOUS

## 2023-10-28 DIAGNOSIS — T8131XA Disruption of external operation (surgical) wound, not elsewhere classified, initial encounter: Secondary | ICD-10-CM | POA: Diagnosis not present

## 2023-10-28 LAB — GLUCOSE, CAPILLARY
Glucose-Capillary: 87 mg/dL (ref 70–99)
Glucose-Capillary: 111 mg/dL — ABNORMAL HIGH (ref 70–99)

## 2023-10-29 ENCOUNTER — Encounter: Payer: Medicare Other | Admitting: Physician Assistant

## 2023-10-29 DIAGNOSIS — T8131XA Disruption of external operation (surgical) wound, not elsewhere classified, initial encounter: Secondary | ICD-10-CM | POA: Diagnosis not present

## 2023-10-29 LAB — GLUCOSE, CAPILLARY
Glucose-Capillary: 114 mg/dL — ABNORMAL HIGH (ref 70–99)
Glucose-Capillary: 134 mg/dL — ABNORMAL HIGH (ref 70–99)
Glucose-Capillary: 133 mg/dL — ABNORMAL HIGH (ref 70–99)

## 2023-10-30 ENCOUNTER — Encounter: Payer: Medicare Other | Admitting: Physician Assistant

## 2023-10-30 DIAGNOSIS — T8131XA Disruption of external operation (surgical) wound, not elsewhere classified, initial encounter: Secondary | ICD-10-CM | POA: Diagnosis not present

## 2023-10-31 ENCOUNTER — Encounter: Payer: Medicare Other | Admitting: Physician Assistant

## 2023-10-31 DIAGNOSIS — T8131XA Disruption of external operation (surgical) wound, not elsewhere classified, initial encounter: Secondary | ICD-10-CM | POA: Diagnosis not present

## 2023-11-01 ENCOUNTER — Encounter: Payer: Medicare Other | Admitting: Physician Assistant

## 2023-11-01 DIAGNOSIS — T8131XA Disruption of external operation (surgical) wound, not elsewhere classified, initial encounter: Secondary | ICD-10-CM | POA: Diagnosis not present

## 2023-11-04 ENCOUNTER — Encounter: Payer: Medicare Other | Admitting: Physician Assistant

## 2023-11-04 DIAGNOSIS — T8131XA Disruption of external operation (surgical) wound, not elsewhere classified, initial encounter: Secondary | ICD-10-CM | POA: Diagnosis not present

## 2023-11-05 ENCOUNTER — Encounter: Payer: Medicare Other | Admitting: Physician Assistant

## 2023-11-05 DIAGNOSIS — T8131XA Disruption of external operation (surgical) wound, not elsewhere classified, initial encounter: Secondary | ICD-10-CM | POA: Diagnosis not present

## 2023-11-06 ENCOUNTER — Encounter: Payer: Medicare Other | Admitting: Physician Assistant

## 2023-11-06 DIAGNOSIS — T8131XA Disruption of external operation (surgical) wound, not elsewhere classified, initial encounter: Secondary | ICD-10-CM | POA: Diagnosis not present

## 2023-11-07 ENCOUNTER — Encounter: Payer: Medicare Other | Admitting: Physician Assistant

## 2023-11-08 ENCOUNTER — Encounter: Payer: Medicare Other | Admitting: Physician Assistant

## 2023-11-08 DIAGNOSIS — T8131XA Disruption of external operation (surgical) wound, not elsewhere classified, initial encounter: Secondary | ICD-10-CM | POA: Diagnosis not present

## 2023-11-11 ENCOUNTER — Encounter: Payer: Medicare Other | Admitting: Physician Assistant

## 2023-11-11 DIAGNOSIS — T8131XA Disruption of external operation (surgical) wound, not elsewhere classified, initial encounter: Secondary | ICD-10-CM | POA: Diagnosis not present

## 2023-11-12 ENCOUNTER — Encounter: Payer: Medicare Other | Admitting: Physician Assistant

## 2023-11-12 DIAGNOSIS — T8131XA Disruption of external operation (surgical) wound, not elsewhere classified, initial encounter: Secondary | ICD-10-CM | POA: Diagnosis not present

## 2023-11-13 ENCOUNTER — Encounter: Payer: Medicare Other | Admitting: Physician Assistant

## 2023-11-13 DIAGNOSIS — T8131XA Disruption of external operation (surgical) wound, not elsewhere classified, initial encounter: Secondary | ICD-10-CM | POA: Diagnosis not present

## 2023-11-14 ENCOUNTER — Other Ambulatory Visit (INDEPENDENT_AMBULATORY_CARE_PROVIDER_SITE_OTHER): Payer: Self-pay | Admitting: Vascular Surgery

## 2023-11-14 ENCOUNTER — Encounter: Payer: Medicare Other | Admitting: Physician Assistant

## 2023-11-14 DIAGNOSIS — Z9889 Other specified postprocedural states: Secondary | ICD-10-CM

## 2023-11-14 DIAGNOSIS — T8131XA Disruption of external operation (surgical) wound, not elsewhere classified, initial encounter: Secondary | ICD-10-CM | POA: Diagnosis not present

## 2023-11-15 ENCOUNTER — Ambulatory Visit (INDEPENDENT_AMBULATORY_CARE_PROVIDER_SITE_OTHER): Payer: Medicare Other

## 2023-11-15 ENCOUNTER — Encounter: Payer: Medicare Other | Admitting: Physician Assistant

## 2023-11-15 ENCOUNTER — Ambulatory Visit (INDEPENDENT_AMBULATORY_CARE_PROVIDER_SITE_OTHER): Payer: Medicare Other | Admitting: Nurse Practitioner

## 2023-11-15 ENCOUNTER — Encounter (INDEPENDENT_AMBULATORY_CARE_PROVIDER_SITE_OTHER): Payer: Self-pay | Admitting: Nurse Practitioner

## 2023-11-15 VITALS — BP 131/62 | HR 69 | Resp 16

## 2023-11-15 DIAGNOSIS — E782 Mixed hyperlipidemia: Secondary | ICD-10-CM | POA: Diagnosis not present

## 2023-11-15 DIAGNOSIS — I739 Peripheral vascular disease, unspecified: Secondary | ICD-10-CM | POA: Diagnosis not present

## 2023-11-15 DIAGNOSIS — T8131XA Disruption of external operation (surgical) wound, not elsewhere classified, initial encounter: Secondary | ICD-10-CM | POA: Diagnosis not present

## 2023-11-15 DIAGNOSIS — Z9889 Other specified postprocedural states: Secondary | ICD-10-CM

## 2023-11-15 DIAGNOSIS — I7025 Atherosclerosis of native arteries of other extremities with ulceration: Secondary | ICD-10-CM | POA: Diagnosis not present

## 2023-11-15 DIAGNOSIS — E1152 Type 2 diabetes mellitus with diabetic peripheral angiopathy with gangrene: Secondary | ICD-10-CM

## 2023-11-17 ENCOUNTER — Encounter (INDEPENDENT_AMBULATORY_CARE_PROVIDER_SITE_OTHER): Payer: Self-pay | Admitting: Nurse Practitioner

## 2023-11-17 NOTE — Progress Notes (Incomplete)
 Subjective:    Patient ID: [name], male    DOB: [birth year], 83 y.o.   MRN: [id number] Chief Complaint  Patient presents with  . Follow-up    [facility] 1 month le angio with ABI    HPI  Review of Systems     Objective:   Physical Exam  BP 131/62   Pulse 69   Resp 16   Past Medical History:  Diagnosis Date  . Aortic stenosis 08/27/2022   a.) TTE 08/27/2022: mild AS (MPG 19.9; AVA 1.1)  . 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 Atherosclerotic PVD with intermittent claudication (HCC)    a.) s/p vascular intervention 03/26/2018:  post tibial and peroneal crosser athrectomy, PTA LEFT post tibial, SFA, and popliteal arteries; b.) s/p PTA 03/20/2023: RIGHT SFA and popliteal arteries  . Bilateral carotid artery disease (HCC) 08/22/2022   a.) carotid doppler 08/22/2022: <50 LICA, 50-69% RICA  . Blurry vision, bilateral   . CAD (coronary artery disease) 09/21/2022   a.) cCTA 09/21/2022: 591 (57 %ile for age/sex/race matched control)  . Chest pain   . Chronic heart failure with preserved ejection fraction (HFpEF) (HCC)    a.) TTE 08/27/2022: EF >55%, aym LVH, mild LAE, triv AR/PR, mild MR/TR. mild AS, Ao root dilatation, G1DD  . CKD (chronic kidney disease), stage III (HCC)   . Gangrene of toe of right foot (HCC)    a.) RIGHT second toe  . Heart murmur   . History of bilateral cataract extraction 2020  . HLD (hyperlipidemia)   . Hypertension   . Incomplete right bundle branch block (RBBB)   . Long term current use of antithrombotics/antiplatelets    a.) clopidogrel  . Neuropathy   . Palpitations   . T2DM (type 2 diabetes mellitus) (HCC)   . Vertigo   . Wears dentures    Full upper and lower (loose)    Social History   Socioeconomic History  . Marital status: Married    Spouse name: Not on file  . Number of children: Not on file  . Years of education: Not on file  . Highest education level:  Not on file  Occupational History  . Not on file  Tobacco Use  . Smoking status: Former    Current packs/day: 0.00    Types: Cigarettes    Quit date: 1985    Years since quitting: 40.1  . Smokeless tobacco: Current    Types: Chew  . Tobacco comments:    Sometime I chew tobacco and sometime I don't.  [facility] 09/02/2024  Vaping Use  . Vaping status: Never Used  Substance and Sexual Activity  . Alcohol use: Yes    Comment: once a month or once every 2-3 mos  . Drug use: Never  . Sexual activity: Never  Other Topics Concern  . Not on file  Social History Narrative  . Not on file   Social Drivers of Health   Financial Resource Strain: Low Risk  (01/10/2023)   Received from [facility]   Overall Financial Resource Strain (CARDIA)   . Difficulty of Paying Living Expenses: Not very hard  Food Insecurity: No Food Insecurity (05/03/2023)   Hunger Vital Sign   . Worried About Programme researcher, broadcasting/film/video in the Last Year: Never true   . Ran Out of Food in the Last Year: Never true  Transportation Needs: No Transportation Needs (05/03/2023)  PRAPARE - Transportation   . Lack of Transportation (Medical): No   . Lack of Transportation (Non-Medical): No  Physical Activity: Not on file  Stress: Not on file  Social Connections: Not on file  Intimate Partner Violence: Not At Risk (05/03/2023)   Humiliation, Afraid, Rape, and Kick questionnaire   . Fear of Current or Ex-Partner: No   . Emotionally Abused: No   . Physically Abused: No   . Sexually Abused: No    Past Surgical History:  Procedure Laterality Date  . AMPUTATION TOE Left 03/28/2018   Procedure: AMPUTATION TOE/ PARTIAL RAY RESECTION-LEFT 2ND AND 3RD;  Surgeon: Linus Galas, DPM;  Location: [facility];  Service: Podiatry;  Laterality: Left;  . AMPUTATION TOE Right 04/16/2023   Procedure: AMPUTATION TOE;  Surgeon: Felecia Shelling, DPM;  Location: [facility];  Service: Orthopedics/Podiatry;  Laterality: Right;  . CATARACT  EXTRACTION W/PHACO Left 02/10/2019   Procedure: CATARACT EXTRACTION PHACO AND INTRAOCULAR LENS PLACEMENT (IOC)  LEFT DIABETIC;  Surgeon: Nevada Crane, MD;  Location: [facility];  Service: Ophthalmology;  Laterality: Left;  Diabetic - oral meds  . CATARACT EXTRACTION W/PHACO Right 04/20/2019   Procedure: CATARACT EXTRACTION PHACO AND INTRAOCULAR LENS PLACEMENT (IOC)  RIGHT DIABETIC;  Surgeon: Nevada Crane, MD;  Location: [facility];  Service: Ophthalmology;  Laterality: Right;  . FEMORAL-TIBIAL BYPASS GRAFT Right 04/11/2023   Procedure: BYPASS GRAFT FEMORAL-TIBIAL ARTERY ( POPLITEAL TO PERONEAL);  Surgeon: Renford Dills, MD;  Location: [facility];  Service: Vascular;  Laterality: Right;  . HERNIA REPAIR    . LOWER EXTREMITY ANGIOGRAPHY Left 03/26/2018   Procedure: Lower Extremity Angiography;  Surgeon: Renford Dills, MD;  Location: [facility] IN[facility];  Service: Cardiovascular;  Laterality: Left;  . LOWER EXTREMITY ANGIOGRAPHY Right 03/20/2023   Procedure: Lower Extremity Angiography;  Surgeon: Renford Dills, MD;  Location: [facility];  Service: Cardiovascular;  Laterality: Right;  . LOWER EXTREMITY ANGIOGRAPHY Right 10/11/2023   Procedure: Lower Extremity Angiography;  Surgeon: Renford Dills, MD;  Location: [facility] INVASIVE CV LAB;  Service: Cardiovascular;  Laterality: Right;  . LOWER EXTREMITY INTERVENTION Right 05/03/2023   Procedure: LOWER EXTREMITY INTERVENTION;  Surgeon: Renford Dills, MD;  Location: [facility] INV[facility] CV [facility];  Service: Cardiovascular;  Laterality: Right;  With Staple removal  . Percutaneous transluminal angioplasty right superficial femoral artery     . 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 Diabetes Mother   . Cancer Father   .  Hypertension Maternal Grandfather   . Heart attack Maternal Grandfather     Allergies  Allergen Reactions  . Influenza Virus Vaccine Other (See Comments) and Nausea And Vomiting    Fever, chills, vomiting.        Latest Ref Rng & Units 05/04/2023    6:17 AM 05/03/2023    6:13 AM 05/02/2023    2:58 PM  CBC  WBC 4.0 - 10.5 K/uL 8.1  11.7  12.2   Hemoglobin 13.0 - 17.0 g/dL 8.2  8.4  91.4   Hematocrit 39.0 - 52.0 % 25.5  24.9  31.2   Platelets 150 - 400 K/uL 176  180  203       CMP     Component Value Date/Time   NA 135 05/04/2023 0617   K 4.2 05/04/2023  0617   CL 107 05/04/2023 0617   CO2 21 (L) 05/04/2023 0617   GLUCOSE 96 05/04/2023 0617   BUN 33 (H) 10/11/2023 1025   CREATININE 1.03 10/11/2023 1025   CALCIUM 8.2 (L) 05/04/2023 0617   PROT 7.5 05/02/2023 1458   ALBUMIN 3.7 05/02/2023 1458   AST 24 05/02/2023 1458   ALT 31 05/02/2023 1458   ALKPHOS 61 05/02/2023 1458   BILITOT 0.8 05/02/2023 1458   GFRNONAA >60 10/11/2023 1025     VAS [place] ABI WITH/WO TBI Result Date: 10/07/2023  LOWER EXTREMITY DOPPLER STUDY Patient Name:  [name]  Date of Exam:   10/07/2023 Medical Rec #: [id number]         Accession #:    [id number] Date of Birth: 01/23/1941         Patient Gender: M Patient Age:   78 years Exam Location:  [facility] Procedure:      VAS [place] ABI WITH/WO TBI Referring Phys: [name] --------------------------------------------------------------------------------  Indications: Peripheral artery disease. Other Factors: Left 2nd & 3rd toe amputation on 03/28/18.  Vascular Interventions: 03/26/18: Left SFA, popliteal, posterior tibial &                         peroneal artery PTAs                          04/11/2023: Right below knee Popliteal Artery to                         Peroneal Artery Bypass with Reverse Saphenous Vein                         Graft. Comparison Study: 05/10/2023 Performing Technologist: Debbe Bales RVS  Examination Guidelines: A  complete evaluation includes at minimum, Doppler waveform signals and systolic blood pressure reading at the level of bilateral brachial, anterior tibial, and posterior tibial arteries, when vessel segments are accessible. Bilateral testing is considered an integral part of a complete examination. Photoelectric Plethysmograph (PPG) waveforms and toe systolic pressure readings are included as required and additional duplex testing as needed. Limited examinations for reoccurring indications may be performed as noted.  ABI Findings: +---------+------------------+-----+----------+--------+ Right    Rt Pressure (mmHg)IndexWaveform  Comment  +---------+------------------+-----+----------+--------+ Brachial 143                                       +---------+------------------+-----+----------+--------+ ATA      78                0.55 monophasic         +---------+------------------+-----+----------+--------+ PTA      70                0.49 monophasic         +---------+------------------+-----+----------+--------+ Great Toe70                0.49 Abnormal           +---------+------------------+-----+----------+--------+ +---------+------------------+-----+-------------------+--------------+ Left     Lt Pressure (mmHg)IndexWaveform           Comment        +---------+------------------+-----+-------------------+--------------+ Brachial 142                                                      +---------+------------------+-----+-------------------+--------------+  ATA      64                0.45 dampened monophasic               +---------+------------------+-----+-------------------+--------------+ PTA      119               0.83 monophasic                        +---------+------------------+-----+-------------------+--------------+ Great Toe                                          Toes Amputated  +---------+------------------+-----+-------------------+--------------+ +-------+-----------+-----------+------------+------------+ ABI/TBIToday's ABIToday's TBIPrevious ABIPrevious TBI +-------+-----------+-----------+------------+------------+ Right  .55        .49        .61                      +-------+-----------+-----------+------------+------------+ Left   .83                   .67                      +-------+-----------+-----------+------------+------------+ Right ABIs appear decreased compared to prior study on 05/10/2023.  Summary: Right: Resting right ankle-brachial index indicates moderate right lower extremity arterial disease. The right toe-brachial index is abnormal. Left: Resting left ankle-brachial index indicates mild left lower extremity arterial disease. *See table(s) above for measurements and observations.  Electronically signed by [name] MD on 10/07/2023 at 4:11:40 PM.    Final        Assessment & Plan:   1. Atherosclerosis of native arteries of the extremities with ulceration (HCC) (Primary) ***  2. Mixed hyperlipidemia ***  3. Type 2 diabetes mellitus with diabetic peripheral angiopathy and gangrene, without long-term current use of insulin (HCC) ***   Current Outpatient Medications on File Prior to Visit  Medication Sig Dispense Refill  . acetaminophen (TYLENOL) 325 MG tablet Take 2 tablets (650 mg total) by mouth every 6 (six) hours as needed for mild pain or fever. 20 tablet 0  . aspirin EC 81 MG tablet Take 1 tablet (81 mg total) by mouth daily at 6 (six) AM. Swallow whole. 30 tablet 12  . clopidogrel (PLAVIX) 75 MG tablet Take 75 mg by mouth daily.    Marland Kitchen docusate sodium (COLACE) 100 MG capsule Take 1 capsule (100 mg total) by mouth daily. 10 capsule 0  . Multiple Vitamin (MULTIVITAMIN) tablet Take 1 tablet by mouth 3 (three) times a week.    Marland Kitchen oxyCODONE-acetaminophen (PERCOCET/ROXICET) 5-325 MG tablet Take 1-2 tablets by mouth every 4  (four) hours as needed for moderate pain. 30 tablet 0  . doxycycline (VIBRA-TABS) 100 MG tablet Take 1 tablet (100 mg total) by mouth 2 (two) times daily. (Patient not taking: Reported on 11/15/2023) 20 tablet 0  . metoprolol tartrate (LOPRESSOR) 25 MG tablet Take tablet TWO hours prior to his cardiac CT scan. (Patient not taking: Reported on 10/11/2023) 30 tablet 0  . rosuvastatin (CRESTOR) 10 MG tablet Take 1 tablet (10 mg total) by mouth daily. (Patient not taking: Reported on 10/11/2023) 30 tablet 11  . valsartan (DIOVAN) 80 MG tablet Take 1 tablet (80 mg total) by mouth daily. (Patient not taking: Reported on 10/11/2023) 30 tablet 0   No current facility-administered medications  on file prior to visit.    There are no Patient Instructions on file for this visit. No follow-ups on file.   Georgiana Spinner, NP

## 2023-11-17 NOTE — Progress Notes (Signed)
 Subjective:    Patient ID: [name], male    DOB: 1941/03/08, 82 y.o.   MRN: [id number] Chief Complaint  Patient presents with   Follow-up    [facility] 1 month le angio with ABI    The patient returns to the office for followup and review status post angiogram with intervention on 10/11/2023.   Procedure:  Procedure(s) Performed:             1.  Introduction catheter into right lower extremity 3rd order catheter placement               2.    Contrast injection right lower extremity for distal runoff             3.  Percutaneous transluminal angioplasty and stent placement right popliteal arteries to 5 mm             4.  Percutaneous transluminal angioplasty and stent placement tibioperoneal trunk and peroneal to 4mm             5.  Star close closure left common femoral arteriotomy   The patient notes improvement in the lower extremity symptoms. No interval shortening of the patient's claudication distance or rest pain symptoms. No new ulcers or wounds have occurred since the last visit.  There have been no significant changes to the patient's overall health care.  No documented history of amaurosis fugax or recent TIA symptoms. There are no recent neurological changes noted. No documented history of DVT, PE or superficial thrombophlebitis. The patient denies recent episodes of angina or shortness of breath.   ABI's Rt=0.90 and Lt=0.57  (previous ABI's Rt=0.55 and Lt=0.83) Duplex US of the bilateral tibial vessels with has monophasic waveforms    Review of Systems  Cardiovascular:  Positive for leg swelling.  Skin:  Positive for wound.  Neurological:  Positive for weakness.  All other systems reviewed and are negative.      Objective:   Physical Exam Vitals reviewed.  HENT:     Head: Normocephalic.  Cardiovascular:     Rate and Rhythm: Normal rate.  Pulmonary:     Effort: Pulmonary effort is normal.  Musculoskeletal:     Right lower leg: Edema present.   Skin:    General: Skin is warm and dry.  Neurological:     Mental Status: He is alert and oriented to person, place, and time.  Psychiatric:        Mood and Affect: Mood normal.        Behavior: Behavior normal.        Thought Content: Thought content normal.        Judgment: Judgment normal.     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   a.) TTE 08/27/2022: Ao sinus 4.1, asc Ao 3.7, ST junction 3.3   Atherosclerotic PVD with intermittent claudication (HCC)    a.) s/p vascular intervention 03/26/2018:  post tibial and peroneal crosser athrectomy, PTA LEFT post tibial, SFA, and popliteal arteries; b.) s/p PTA 03/20/2023: RIGHT SFA and popliteal arteries   Bilateral carotid artery disease (HCC) 08/22/2022   a.) carotid doppler 08/22/2022: <50 LICA, 50-69% RICA   Blurry vision, bilateral    CAD (coronary artery disease) 09/21/2022   a.) cCTA 09/21/2022: 591 (57 %ile for age/sex/race  matched control)   Chest pain    Chronic heart failure with preserved ejection fraction (HFpEF) (HCC)    a.) TTE 08/27/2022: EF >55%, aym LVH, mild LAE, triv AR/PR, mild MR/TR. mild AS, Ao root dilatation, G1DD   CKD (chronic kidney disease), stage III (HCC)    Gangrene of toe of right foot (HCC)    a.) RIGHT second toe   Heart murmur    History of bilateral cataract extraction 2020   HLD (hyperlipidemia)    Hypertension    Incomplete right bundle branch block (RBBB)    Long term current use of antithrombotics/antiplatelets    a.) clopidogrel   Neuropathy    Palpitations    T2DM (type 2 diabetes mellitus) (HCC)    Vertigo    Wears dentures    Full upper and lower (loose)    Social History   Socioeconomic History   Marital status: Married    Spouse name: Not on file   Number of children: Not on file   Years of education: Not on file    Highest education level: Not on file  Occupational History   Not on file  Tobacco Use   Smoking status: Former    Current packs/day: 0.00    Types: Cigarettes    Quit date: 1985    Years since quitting: 40.1   Smokeless tobacco: Current    Types: Chew   Tobacco comments:    Sometime I chew tobacco and sometime I don't.  DJM 09/02/2024  Vaping Use   Vaping status: Never Used  Substance and Sexual Activity   Alcohol use: Yes    Comment: once a month or once every 2-3 mos   Drug use: Never   Sexual activity: Never  Other Topics Concern   Not on file  Social History Narrative   Not on file   Social Drivers of Health   Financial Resource Strain: Low Risk  (01/10/2023)   Received from [facility]   Overall Financial Resource Strain (CARDIA)    Difficulty of Paying Living Expenses: Not very hard  Food Insecurity: No Food Insecurity (05/03/2023)   Hunger Vital Sign    Worried About Running Out of Food in the Last Year: Never true    Ran Out of Food in the Last Year: Never true  Transportation Needs: No Transportation Needs (05/03/2023)   PRAPARE - Administrator, Civil Service (Medical): No    Lack of Transportation (Non-Medical): No  Physical Activity: Not on file  Stress: Not on file  Social Connections: Not on file  Intimate Partner Violence: Not At Risk (05/03/2023)   Humiliation, Afraid, Rape, and Kick questionnaire    Fear of Current or Ex-Partner: No    Emotionally Abused: No    Physically Abused: No    Sexually Abused: No    Past Surgical History:  Procedure Laterality Date   AMPUTATION TOE Left 03/28/2018   Procedure: AMPUTATION TOE/ PARTIAL RAY RESECTION-LEFT 2ND AND 3RD;  Surgeon: Linus Galas, DPM;  Location: [facility];  Service: Podiatry;  Laterality: Left;   AMPUTATION TOE Right 04/16/2023   Procedure: AMPUTATION TOE;  Surgeon: Felecia Shelling, DPM;  Location: [facility];  Service: Orthopedics/Podiatry;  Laterality: Right;   CATARACT  EXTRACTION W/PHACO Left 02/10/2019   Procedure: CATARACT EXTRACTION PHACO AND INTRAOCULAR LENS PLACEMENT (IOC)  LEFT DIABETIC;  Surgeon: Nevada Crane, MD;  Location: [facility];  Service: Ophthalmology;  Laterality: Left;  Diabetic - oral meds  CATARACT EXTRACTION W/PHACO Right 04/20/2019   Procedure: CATARACT EXTRACTION PHACO AND INTRAOCULAR LENS PLACEMENT (IOC)  RIGHT DIABETIC;  Surgeon: Nevada Crane, MD;  Location: [facility];  Service: Ophthalmology;  Laterality: Right;   FEMORAL-TIBIAL BYPASS GRAFT Right 04/11/2023   Procedure: BYPASS GRAFT FEMORAL-TIBIAL ARTERY ( POPLITEAL TO PERONEAL);  Surgeon: Renford Dills, MD;  Location: [facility];  Service: Vascular;  Laterality: Right;   HERNIA REPAIR     LOWER EXTREMITY ANGIOGRAPHY Left 03/26/2018   Procedure: Lower Extremity Angiography;  Surgeon: Renford Dills, MD;  Location: [facility];  Service: Cardiovascular;  Laterality: Left;   LOWER EXTREMITY ANGIOGRAPHY Right 03/20/2023   Procedure: Lower Extremity Angiography;  Surgeon: Renford Dills, MD;  Location: [facility];  Service: Cardiovascular;  Laterality: Right;   LOWER EXTREMITY ANGIOGRAPHY Right 10/11/2023   Procedure: Lower Extremity Angiography;  Surgeon: Renford Dills, MD;  Location: [facility];  Service: Cardiovascular;  Laterality: Right;   LOWER EXTREMITY INTERVENTION Right 05/03/2023   Procedure: LOWER EXTREMITY INTERVENTION;  Surgeon: Renford Dills, MD;  Location: [facility];  Service: Cardiovascular;  Laterality: Right;  With Staple removal   Percutaneous transluminal angioplasty right superficial femoral artery      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   Procedure: TRANSMETATARSAL AMPUTATION;  Surgeon: Recardo Evangelist, DPM;  Location: [facility];  Service: Podiatry;  Laterality: Left;    Family History  Problem Relation Age of Onset   Diabetes Mother    Cancer Father    Hypertension  Maternal Grandfather    Heart attack Maternal Grandfather     Allergies  Allergen Reactions   Influenza Virus Vaccine Other (See Comments) and Nausea And Vomiting    Fever, chills, vomiting.        Latest Ref Rng & Units 05/04/2023    6:17 AM 05/03/2023    6:13 AM 05/02/2023    2:58 PM  CBC  WBC 4.0 - 10.5 K/uL 8.1  11.7  12.2   Hemoglobin 13.0 - 17.0 g/dL 8.2  8.4  29.5   Hematocrit 39.0 - 52.0 % 25.5  24.9  31.2   Platelets 150 - 400 K/uL 176  180  203       CMP     Component Value Date/Time   NA 135 05/04/2023 0617   K 4.2 05/04/2023 0617   CL 107 05/04/2023 0617   CO2 21 (L) 05/04/2023 0617   GLUCOSE 96 05/04/2023 0617   BUN 33 (H) 10/11/2023 1025   CREATININE 1.03 10/11/2023 1025   CALCIUM 8.2 (L) 05/04/2023 0617   PROT 7.5 05/02/2023 1458   ALBUMIN 3.7 05/02/2023 1458   AST 24 05/02/2023 1458   ALT 31 05/02/2023 1458   ALKPHOS 61 05/02/2023 1458   BILITOT 0.8 05/02/2023 1458   GFRNONAA >60 10/11/2023 1025     VAS [place] ABI WITH/WO TBI Result Date: 10/07/2023  LOWER EXTREMITY DOPPLER STUDY Patient Name:  TRACIE LINDBLOOM  Date of Exam:   10/07/2023 Medical Rec #: [id number]         Accession #:    [id number] Date of Birth: [birth year]         Patient Gender: M Patient Age:   68 years Exam Location:  [facility] Procedure:      VAS [place] ABI WITH/WO TBI Referring Phys: [name] --------------------------------------------------------------------------------  Indications: Peripheral artery disease. Other Factors: Left 2nd & 3rd toe amputation on 03/28/18.  Vascular Interventions: 03/26/18: Left  SFA, popliteal, posterior tibial &                         peroneal artery PTAs                          04/11/2023: Right below knee Popliteal Artery to                         Peroneal Artery Bypass with Reverse Saphenous Vein                         Graft. Comparison Study: 05/10/2023 Performing Technologist: Debbe Bales RVS  Examination Guidelines: A complete  evaluation includes at minimum, Doppler waveform signals and systolic blood pressure reading at the level of bilateral brachial, anterior tibial, and posterior tibial arteries, when vessel segments are accessible. Bilateral testing is considered an integral part of a complete examination. Photoelectric Plethysmograph (PPG) waveforms and toe systolic pressure readings are included as required and additional duplex testing as needed. Limited examinations for reoccurring indications may be performed as noted.  ABI Findings: +---------+------------------+-----+----------+--------+ Right    Rt Pressure (mmHg)IndexWaveform  Comment  +---------+------------------+-----+----------+--------+ Brachial 143                                       +---------+------------------+-----+----------+--------+ ATA      78                0.55 monophasic         +---------+------------------+-----+----------+--------+ PTA      70                0.49 monophasic         +---------+------------------+-----+----------+--------+ Great Toe70                0.49 Abnormal           +---------+------------------+-----+----------+--------+ +---------+------------------+-----+-------------------+--------------+ Left     Lt Pressure (mmHg)IndexWaveform           Comment        +---------+------------------+-----+-------------------+--------------+ Brachial 142                                                      +---------+------------------+-----+-------------------+--------------+ ATA      64                0.45 dampened monophasic               +---------+------------------+-----+-------------------+--------------+ PTA      119               0.83 monophasic                        +---------+------------------+-----+-------------------+--------------+ Great Toe                                          Toes Amputated +---------+------------------+-----+-------------------+--------------+  +-------+-----------+-----------+------------+------------+ ABI/TBIToday's ABIToday's TBIPrevious ABIPrevious TBI +-------+-----------+-----------+------------+------------+ Right  .55        .49        .61                      +-------+-----------+-----------+------------+------------+  Left   .83                   .67                      +-------+-----------+-----------+------------+------------+ Right ABIs appear decreased compared to prior study on 05/10/2023.  Summary: Right: Resting right ankle-brachial index indicates moderate right lower extremity arterial disease. The right toe-brachial index is abnormal. Left: Resting left ankle-brachial index indicates mild left lower extremity arterial disease. *See table(s) above for measurements and observations.  Electronically signed by [name] MD on 10/07/2023 at 4:11:40 PM.    Final        Assessment & Plan:   1. Atherosclerosis of native arteries of the extremities with ulceration (HCC) (Primary) The patient as well as wound care records it appears that the wound is healing.  Today his studies do indicate worsening of his left lower extremity in addition to some decreased TBI's on the right.  Given that his wounds are improving and he has no rest pain like symptoms we will continue to proceed conservatively.  Will have her return in 1 month to repeat noninvasive studies but they advised to contact us if the wounds began to deteriorate sooner  2. Mixed hyperlipidemia Continue statin as ordered and reviewed, no changes at this time  3. Type 2 diabetes mellitus with diabetic peripheral angiopathy and gangrene, without long-term current use of insulin (HCC) Continue hypoglycemic medications as already ordered, these medications have been reviewed and there are no changes at this time.  Hgb A1C to be monitored as already arranged by primary service   Current Outpatient Medications on File Prior to Visit  Medication Sig  Dispense Refill   acetaminophen (TYLENOL) 325 MG tablet Take 2 tablets (650 mg total) by mouth every 6 (six) hours as needed for mild pain or fever. 20 tablet 0   aspirin EC 81 MG tablet Take 1 tablet (81 mg total) by mouth daily at 6 (six) AM. Swallow whole. 30 tablet 12   clopidogrel (PLAVIX) 75 MG tablet Take 75 mg by mouth daily.     docusate sodium (COLACE) 100 MG capsule Take 1 capsule (100 mg total) by mouth daily. 10 capsule 0   Multiple Vitamin (MULTIVITAMIN) tablet Take 1 tablet by mouth 3 (three) times a week.     oxyCODONE-acetaminophen (PERCOCET/ROXICET) 5-325 MG tablet Take 1-2 tablets by mouth every 4 (four) hours as needed for moderate pain. 30 tablet 0   doxycycline (VIBRA-TABS) 100 MG tablet Take 1 tablet (100 mg total) by mouth 2 (two) times daily. (Patient not taking: Reported on 11/15/2023) 20 tablet 0   metoprolol tartrate (LOPRESSOR) 25 MG tablet Take tablet TWO hours prior to his cardiac CT scan. (Patient not taking: Reported on 10/11/2023) 30 tablet 0   rosuvastatin (CRESTOR) 10 MG tablet Take 1 tablet (10 mg total) by mouth daily. (Patient not taking: Reported on 10/11/2023) 30 tablet 11   valsartan (DIOVAN) 80 MG tablet Take 1 tablet (80 mg total) by mouth daily. (Patient not taking: Reported on 10/11/2023) 30 tablet 0   No current facility-administered medications on file prior to visit.    There are no Patient Instructions on file for this visit. No follow-ups on file.   Georgiana Spinner, NP

## 2023-11-18 ENCOUNTER — Encounter: Payer: Medicare Other | Attending: Physician Assistant | Admitting: Physician Assistant

## 2023-11-18 DIAGNOSIS — N183 Chronic kidney disease, stage 3 unspecified: Secondary | ICD-10-CM | POA: Insufficient documentation

## 2023-11-18 DIAGNOSIS — Z7901 Long term (current) use of anticoagulants: Secondary | ICD-10-CM | POA: Diagnosis not present

## 2023-11-18 DIAGNOSIS — T8131XA Disruption of external operation (surgical) wound, not elsewhere classified, initial encounter: Secondary | ICD-10-CM | POA: Diagnosis present

## 2023-11-18 DIAGNOSIS — E11621 Type 2 diabetes mellitus with foot ulcer: Secondary | ICD-10-CM | POA: Diagnosis not present

## 2023-11-18 DIAGNOSIS — M86371 Chronic multifocal osteomyelitis, right ankle and foot: Secondary | ICD-10-CM | POA: Insufficient documentation

## 2023-11-18 DIAGNOSIS — I2510 Atherosclerotic heart disease of native coronary artery without angina pectoris: Secondary | ICD-10-CM | POA: Diagnosis not present

## 2023-11-18 DIAGNOSIS — L97513 Non-pressure chronic ulcer of other part of right foot with necrosis of muscle: Secondary | ICD-10-CM | POA: Diagnosis not present

## 2023-11-18 DIAGNOSIS — L97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 ulcer of other part of right foot with fat layer exposed: Secondary | ICD-10-CM | POA: Diagnosis not present

## 2023-11-18 DIAGNOSIS — I73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: Secondary | ICD-10-CM | POA: Diagnosis not present

## 2023-11-18 DIAGNOSIS — I129 Hypertensive chronic kidney disease with stage 1 through stage 4 chronic kidney disease, or unspecified chronic kidney disease: Secondary | ICD-10-CM | POA: Diagnosis not present

## 2023-11-18 DIAGNOSIS — E1122 Type 2 diabetes mellitus with diabetic chronic kidney disease: Secondary | ICD-10-CM | POA: Diagnosis not present

## 2023-11-18 DIAGNOSIS — E1169 Type 2 diabetes mellitus with other specified complication: Secondary | ICD-10-CM | POA: Diagnosis not present

## 2023-11-18 DIAGNOSIS — X58XXXA Exposure to other specified factors, initial encounter: Secondary | ICD-10-CM | POA: Diagnosis not present

## 2023-11-19 ENCOUNTER — Encounter: Payer: Medicare Other | Admitting: Physician Assistant

## 2023-11-19 DIAGNOSIS — T8131XA Disruption of external operation (surgical) wound, not elsewhere classified, initial encounter: Secondary | ICD-10-CM | POA: Diagnosis not present

## 2023-11-19 LAB — VAS US ABI WITH/WO TBI
Left ABI: 0.57
Right ABI: 0.9

## 2023-11-20 ENCOUNTER — Encounter: Payer: Medicare Other | Admitting: Physician Assistant

## 2023-11-20 DIAGNOSIS — T8131XA Disruption of external operation (surgical) wound, not elsewhere classified, initial encounter: Secondary | ICD-10-CM | POA: Diagnosis not present

## 2023-11-21 ENCOUNTER — Encounter: Payer: Medicare Other | Admitting: Physician Assistant

## 2023-11-21 ENCOUNTER — Ambulatory Visit: Payer: Medicare Other | Admitting: Physician Assistant

## 2023-11-21 ENCOUNTER — Encounter: Admitting: Physician Assistant

## 2023-11-21 DIAGNOSIS — T8131XA Disruption of external operation (surgical) wound, not elsewhere classified, initial encounter: Secondary | ICD-10-CM | POA: Diagnosis not present

## 2023-11-22 ENCOUNTER — Encounter: Payer: Medicare Other | Admitting: Physician Assistant

## 2023-11-22 DIAGNOSIS — T8131XA Disruption of external operation (surgical) wound, not elsewhere classified, initial encounter: Secondary | ICD-10-CM | POA: Diagnosis not present

## 2023-11-25 ENCOUNTER — Encounter: Payer: Medicare Other | Admitting: Physician Assistant

## 2023-11-25 DIAGNOSIS — T8131XA Disruption of external operation (surgical) wound, not elsewhere classified, initial encounter: Secondary | ICD-10-CM | POA: Diagnosis not present

## 2023-11-26 ENCOUNTER — Encounter: Payer: Medicare Other | Admitting: Physician Assistant

## 2023-11-26 ENCOUNTER — Ambulatory Visit: Payer: Medicare Other | Admitting: Physician Assistant

## 2023-11-26 DIAGNOSIS — T8131XA Disruption of external operation (surgical) wound, not elsewhere classified, initial encounter: Secondary | ICD-10-CM | POA: Diagnosis not present

## 2023-11-27 ENCOUNTER — Encounter: Payer: Medicare Other | Admitting: Physician Assistant

## 2023-11-27 DIAGNOSIS — T8131XA Disruption of external operation (surgical) wound, not elsewhere classified, initial encounter: Secondary | ICD-10-CM | POA: Diagnosis not present

## 2023-11-28 ENCOUNTER — Encounter: Payer: Medicare Other | Admitting: Physician Assistant

## 2023-11-28 DIAGNOSIS — T8131XA Disruption of external operation (surgical) wound, not elsewhere classified, initial encounter: Secondary | ICD-10-CM | POA: Diagnosis not present

## 2023-11-29 ENCOUNTER — Encounter: Payer: Medicare Other | Admitting: Physician Assistant

## 2023-11-29 DIAGNOSIS — T8131XA Disruption of external operation (surgical) wound, not elsewhere classified, initial encounter: Secondary | ICD-10-CM | POA: Diagnosis not present

## 2023-12-02 ENCOUNTER — Encounter: Payer: Medicare Other | Admitting: Physician Assistant

## 2023-12-02 DIAGNOSIS — T8131XA Disruption of external operation (surgical) wound, not elsewhere classified, initial encounter: Secondary | ICD-10-CM | POA: Diagnosis not present

## 2023-12-03 ENCOUNTER — Encounter: Payer: Medicare Other | Admitting: Physician Assistant

## 2023-12-03 DIAGNOSIS — T8131XA Disruption of external operation (surgical) wound, not elsewhere classified, initial encounter: Secondary | ICD-10-CM | POA: Diagnosis not present

## 2023-12-04 ENCOUNTER — Encounter: Payer: Medicare Other | Admitting: Physician Assistant

## 2023-12-04 DIAGNOSIS — T8131XA Disruption of external operation (surgical) wound, not elsewhere classified, initial encounter: Secondary | ICD-10-CM | POA: Diagnosis not present

## 2023-12-05 ENCOUNTER — Encounter: Payer: Medicare Other | Admitting: Physician Assistant

## 2023-12-05 DIAGNOSIS — T8131XA Disruption of external operation (surgical) wound, not elsewhere classified, initial encounter: Secondary | ICD-10-CM | POA: Diagnosis not present

## 2023-12-06 ENCOUNTER — Encounter: Payer: Medicare Other | Admitting: Physician Assistant

## 2023-12-06 DIAGNOSIS — T8131XA Disruption of external operation (surgical) wound, not elsewhere classified, initial encounter: Secondary | ICD-10-CM | POA: Diagnosis not present

## 2023-12-09 ENCOUNTER — Encounter (INDEPENDENT_AMBULATORY_CARE_PROVIDER_SITE_OTHER): Payer: Medicare Other

## 2023-12-09 ENCOUNTER — Encounter: Payer: Medicare Other | Admitting: Physician Assistant

## 2023-12-09 ENCOUNTER — Ambulatory Visit (INDEPENDENT_AMBULATORY_CARE_PROVIDER_SITE_OTHER): Payer: Medicare Other | Admitting: Vascular Surgery

## 2023-12-09 DIAGNOSIS — T8131XA Disruption of external operation (surgical) wound, not elsewhere classified, initial encounter: Secondary | ICD-10-CM | POA: Diagnosis not present

## 2023-12-10 ENCOUNTER — Encounter: Payer: Medicare Other | Admitting: Physician Assistant

## 2023-12-10 ENCOUNTER — Ambulatory Visit: Admitting: Physician Assistant

## 2023-12-10 DIAGNOSIS — T8131XA Disruption of external operation (surgical) wound, not elsewhere classified, initial encounter: Secondary | ICD-10-CM | POA: Diagnosis not present

## 2023-12-11 ENCOUNTER — Encounter: Payer: Medicare Other | Admitting: Internal Medicine

## 2023-12-11 DIAGNOSIS — T8131XA Disruption of external operation (surgical) wound, not elsewhere classified, initial encounter: Secondary | ICD-10-CM | POA: Diagnosis not present

## 2023-12-12 ENCOUNTER — Encounter: Admitting: Internal Medicine

## 2023-12-12 ENCOUNTER — Encounter: Payer: Medicare Other | Admitting: Internal Medicine

## 2023-12-12 DIAGNOSIS — T8131XA Disruption of external operation (surgical) wound, not elsewhere classified, initial encounter: Secondary | ICD-10-CM | POA: Diagnosis not present

## 2023-12-13 ENCOUNTER — Encounter: Payer: Medicare Other | Admitting: Internal Medicine

## 2023-12-13 DIAGNOSIS — T8131XA Disruption of external operation (surgical) wound, not elsewhere classified, initial encounter: Secondary | ICD-10-CM | POA: Diagnosis not present

## 2023-12-16 ENCOUNTER — Encounter: Admitting: Internal Medicine

## 2023-12-16 DIAGNOSIS — T8131XA Disruption of external operation (surgical) wound, not elsewhere classified, initial encounter: Secondary | ICD-10-CM | POA: Diagnosis not present

## 2023-12-17 ENCOUNTER — Encounter: Attending: Internal Medicine | Admitting: Internal Medicine

## 2023-12-17 DIAGNOSIS — M86371 Chronic multifocal osteomyelitis, right ankle and foot: Secondary | ICD-10-CM | POA: Diagnosis not present

## 2023-12-17 DIAGNOSIS — I73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: Secondary | ICD-10-CM | POA: Insufficient documentation

## 2023-12-17 DIAGNOSIS — L97513 Non-pressure chronic ulcer of other part of right foot with necrosis of muscle: Secondary | ICD-10-CM | POA: Insufficient documentation

## 2023-12-17 DIAGNOSIS — N183 Chronic kidney disease, stage 3 unspecified: Secondary | ICD-10-CM | POA: Insufficient documentation

## 2023-12-17 DIAGNOSIS — X58XXXA Exposure to other specified factors, initial encounter: Secondary | ICD-10-CM | POA: Diagnosis not present

## 2023-12-17 DIAGNOSIS — E1122 Type 2 diabetes mellitus with diabetic chronic kidney disease: Secondary | ICD-10-CM | POA: Diagnosis not present

## 2023-12-17 DIAGNOSIS — E11621 Type 2 diabetes mellitus with foot ulcer: Secondary | ICD-10-CM | POA: Insufficient documentation

## 2023-12-17 DIAGNOSIS — I2510 Atherosclerotic heart disease of native coronary artery without angina pectoris: Secondary | ICD-10-CM | POA: Diagnosis not present

## 2023-12-17 DIAGNOSIS — T8131XA Disruption of external operation (surgical) wound, not elsewhere classified, initial encounter: Secondary | ICD-10-CM | POA: Diagnosis present

## 2023-12-17 DIAGNOSIS — E1169 Type 2 diabetes mellitus with other specified complication: Secondary | ICD-10-CM | POA: Insufficient documentation

## 2023-12-17 DIAGNOSIS — I129 Hypertensive chronic kidney disease with stage 1 through stage 4 chronic kidney disease, or unspecified chronic kidney disease: Secondary | ICD-10-CM | POA: Insufficient documentation

## 2023-12-17 DIAGNOSIS — L97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 ulcer of other part of right foot with fat layer exposed: Secondary | ICD-10-CM | POA: Diagnosis not present

## 2023-12-17 DIAGNOSIS — Z7901 Long term (current) use of anticoagulants: Secondary | ICD-10-CM | POA: Diagnosis not present

## 2023-12-18 ENCOUNTER — Encounter: Admitting: Physician Assistant

## 2023-12-18 DIAGNOSIS — T8131XA Disruption of external operation (surgical) wound, not elsewhere classified, initial encounter: Secondary | ICD-10-CM | POA: Diagnosis not present

## 2023-12-19 ENCOUNTER — Encounter: Admitting: Physician Assistant

## 2023-12-19 DIAGNOSIS — T8131XA Disruption of external operation (surgical) wound, not elsewhere classified, initial encounter: Secondary | ICD-10-CM | POA: Diagnosis not present

## 2023-12-20 ENCOUNTER — Encounter: Admitting: Physician Assistant

## 2023-12-20 DIAGNOSIS — T8131XA Disruption of external operation (surgical) wound, not elsewhere classified, initial encounter: Secondary | ICD-10-CM | POA: Diagnosis not present

## 2023-12-23 ENCOUNTER — Encounter: Admitting: Physician Assistant

## 2023-12-23 DIAGNOSIS — T8131XA Disruption of external operation (surgical) wound, not elsewhere classified, initial encounter: Secondary | ICD-10-CM | POA: Diagnosis not present

## 2023-12-24 ENCOUNTER — Encounter: Admitting: Physician Assistant

## 2023-12-25 ENCOUNTER — Encounter: Admitting: Physician Assistant

## 2023-12-25 ENCOUNTER — Other Ambulatory Visit (INDEPENDENT_AMBULATORY_CARE_PROVIDER_SITE_OTHER): Payer: Self-pay | Admitting: Vascular Surgery

## 2023-12-25 DIAGNOSIS — I739 Peripheral vascular disease, unspecified: Secondary | ICD-10-CM

## 2023-12-25 DIAGNOSIS — T8131XA Disruption of external operation (surgical) wound, not elsewhere classified, initial encounter: Secondary | ICD-10-CM | POA: Diagnosis not present

## 2023-12-26 ENCOUNTER — Encounter: Admitting: Physician Assistant

## 2023-12-26 DIAGNOSIS — T8131XA Disruption of external operation (surgical) wound, not elsewhere classified, initial encounter: Secondary | ICD-10-CM | POA: Diagnosis not present

## 2023-12-27 ENCOUNTER — Encounter: Admitting: Physician Assistant

## 2023-12-27 ENCOUNTER — Encounter (INDEPENDENT_AMBULATORY_CARE_PROVIDER_SITE_OTHER): Payer: Self-pay | Admitting: Nurse Practitioner

## 2023-12-27 ENCOUNTER — Ambulatory Visit (INDEPENDENT_AMBULATORY_CARE_PROVIDER_SITE_OTHER): Payer: Medicare Other

## 2023-12-27 ENCOUNTER — Ambulatory Visit (INDEPENDENT_AMBULATORY_CARE_PROVIDER_SITE_OTHER): Payer: Medicare Other | Admitting: Nurse Practitioner

## 2023-12-27 VITALS — BP 152/71 | HR 69 | Resp 16

## 2023-12-27 DIAGNOSIS — E1152 Type 2 diabetes mellitus with diabetic peripheral angiopathy with gangrene: Secondary | ICD-10-CM

## 2023-12-27 DIAGNOSIS — E782 Mixed hyperlipidemia: Secondary | ICD-10-CM

## 2023-12-27 DIAGNOSIS — I7025 Atherosclerosis of native arteries of other extremities with ulceration: Secondary | ICD-10-CM

## 2023-12-27 DIAGNOSIS — I739 Peripheral vascular disease, unspecified: Secondary | ICD-10-CM

## 2023-12-27 DIAGNOSIS — Z9889 Other specified postprocedural states: Secondary | ICD-10-CM

## 2023-12-27 DIAGNOSIS — T8131XA Disruption of external operation (surgical) wound, not elsewhere classified, initial encounter: Secondary | ICD-10-CM | POA: Diagnosis not present

## 2023-12-30 ENCOUNTER — Encounter (INDEPENDENT_AMBULATORY_CARE_PROVIDER_SITE_OTHER): Payer: Self-pay | Admitting: Nurse Practitioner

## 2023-12-30 ENCOUNTER — Encounter: Admitting: Physician Assistant

## 2023-12-30 DIAGNOSIS — T8131XA Disruption of external operation (surgical) wound, not elsewhere classified, initial encounter: Secondary | ICD-10-CM | POA: Diagnosis not present

## 2023-12-30 NOTE — Progress Notes (Signed)
 Subjective:    Patient ID: [name], male    DOB: 1940/11/23, 83 y.o.   MRN: [id number] Chief Complaint  Patient presents with   Follow-up    4-6 week abi and right le arterial    The patient returns to the office for followup and review status post angiogram with intervention on 10/11/2023.   Procedure:  Procedure(s) Performed:             1.  Introduction catheter into right lower extremity 3rd order catheter placement               2.    Contrast injection right lower extremity for distal runoff             3.  Percutaneous transluminal angioplasty and stent placement right popliteal arteries to 5 mm             4.  Percutaneous transluminal angioplasty and stent placement tibioperoneal trunk and peroneal to 4mm             5.  Star close closure left common femoral arteriotomy   The patient notes improvement in the lower extremity symptoms. No interval shortening of the patient's claudication distance or rest pain symptoms. No new ulcers or wounds have occurred since the last visit.  Per notes from wound care it has been healing well and most of his wounds are closed.  There have been no significant changes to the patient's overall health care.  No documented history of amaurosis fugax or recent TIA symptoms. There are no recent neurological changes noted. No documented history of DVT, PE or superficial thrombophlebitis. The patient denies recent episodes of angina or shortness of breath.   Unable to obtain ABIs of the right lower extremity due to dressings placed this morning by wound care however arterial duplex has primarily biphasic triphasic waveforms through the lower extremity and tibial it reaches the distal vessels.  Previously placed stent is patent with monophasic waveforms.  The left has some stenosis within the tibial vessels    Review of Systems  Cardiovascular:  Positive for leg swelling.  Skin:  Positive for wound.  Neurological:  Positive for  weakness.  All other systems reviewed and are negative.      Objective:   Physical Exam Vitals reviewed.  HENT:     Head: Normocephalic.  Cardiovascular:     Rate and Rhythm: Normal rate.  Pulmonary:     Effort: Pulmonary effort is normal.  Musculoskeletal:     Right lower leg: Edema present.  Skin:    General: Skin is warm and dry.  Neurological:     Mental Status: He is alert and oriented to person, place, and time.  Psychiatric:        Mood and Affect: Mood normal.        Behavior: Behavior normal.        Thought Content: Thought content normal.        Judgment: Judgment normal.     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   a.) TTE 08/27/2022: Ao sinus 4.1, asc Ao 3.7, ST junction 3.3   Atherosclerotic PVD with intermittent claudication (HCC)    a.) s/p vascular intervention 03/26/2018:  post tibial and peroneal crosser athrectomy, PTA LEFT post tibial,  SFA, and popliteal arteries; b.) s/p PTA 03/20/2023: RIGHT SFA and popliteal arteries   Bilateral carotid artery disease (HCC) 08/22/2022   a.) carotid doppler 08/22/2022: <50 LICA, 50-69% RICA   Blurry vision, bilateral    CAD (coronary artery disease) 09/21/2022   a.) cCTA 09/21/2022: 591 (57 %ile for age/sex/race matched control)   Chest pain    Chronic heart failure with preserved ejection fraction (HFpEF) (HCC)    a.) TTE 08/27/2022: EF >55%, aym LVH, mild LAE, triv AR/PR, mild MR/TR. mild AS, Ao root dilatation, G1DD   CKD (chronic kidney disease), stage III (HCC)    Gangrene of toe of right foot (HCC)    a.) RIGHT second toe   Heart murmur    History of bilateral cataract extraction 2020   HLD (hyperlipidemia)    Hypertension    Incomplete right bundle branch block (RBBB)    Long term current use of antithrombotics/antiplatelets    a.)  clopidogrel   Neuropathy    Palpitations    T2DM (type 2 diabetes mellitus) (HCC)    Vertigo    Wears dentures    Full upper and lower (loose)    Social History   Socioeconomic History   Marital status: Married    Spouse name: Not on file   Number of children: Not on file   Years of education: Not on file   Highest education level: Not on file  Occupational History   Not on file  Tobacco Use   Smoking status: Former    Current packs/day: 0.00    Types: Cigarettes    Quit date: 1985    Years since quitting: 40.3   Smokeless tobacco: Current    Types: Chew   Tobacco comments:    Sometime I chew tobacco and sometime I don't.  DJM 09/02/2024  Vaping Use   Vaping status: Never Used  Substance and Sexual Activity   Alcohol use: Yes    Comment: once a month or once every 2-3 mos   Drug use: Never   Sexual activity: Never  Other Topics Concern   Not on file  Social History Narrative   Not on file   Social Drivers of Health   Financial Resource Strain: Low Risk  (01/10/2023)   Received from [facility]   Overall Financial Resource Strain (CARDIA)    Difficulty of Paying Living Expenses: Not very hard  Food Insecurity: No Food Insecurity (05/03/2023)   Hunger Vital Sign    Worried About Running Out of Food in the Last Year: Never true    Ran Out of Food in the Last Year: Never true  Transportation Needs: No Transportation Needs (05/03/2023)   PRAPARE - Administrator, Civil Service (Medical): No    Lack of Transportation (Non-Medical): No  Physical Activity: Not on file  Stress: Not on file  Social Connections: Not on file  Intimate Partner Violence: Not At Risk (05/03/2023)   Humiliation, Afraid, Rape, and Kick questionnaire    Fear of Current or Ex-Partner: No    Emotionally Abused: No    Physically Abused: No    Sexually Abused: No    Past Surgical History:  Procedure Laterality Date   AMPUTATION TOE Left 03/28/2018   Procedure:  AMPUTATION TOE/ PARTIAL RAY RESECTION-LEFT 2ND AND 3RD;  Surgeon: Angel Barba, DPM;  Location: [facility];  Service: Podiatry;  Laterality: Left;   AMPUTATION TOE Right 04/16/2023   Procedure: AMPUTATION TOE;  Surgeon: Dot Gazella, DPM;  Location: [facility];  Service: Orthopedics/Podiatry;  Laterality: Right;   CATARACT EXTRACTION W/PHACO Left 02/10/2019   Procedure: CATARACT EXTRACTION PHACO AND INTRAOCULAR LENS PLACEMENT (IOC)  LEFT DIABETIC;  Surgeon: Nevada Crane, MD;  Location: [facility];  Service: Ophthalmology;  Laterality: Left;  Diabetic - oral meds   CATARACT EXTRACTION W/PHACO Right 04/20/2019   Procedure: CATARACT EXTRACTION PHACO AND INTRAOCULAR LENS PLACEMENT (IOC)  RIGHT DIABETIC;  Surgeon: Nevada Crane, MD;  Location: [facility];  Service: Ophthalmology;  Laterality: Right;   FEMORAL-TIBIAL BYPASS GRAFT Right 04/11/2023   Procedure: BYPASS GRAFT FEMORAL-TIBIAL ARTERY ( POPLITEAL TO PERONEAL);  Surgeon: Renford Dills, MD;  Location: [facility];  Service: Vascular;  Laterality: Right;   HERNIA REPAIR     LOWER EXTREMITY ANGIOGRAPHY Left 03/26/2018   Procedure: Lower Extremity Angiography;  Surgeon: Renford Dills, MD;  Location: [facility];  Service: Cardiovascular;  Laterality: Left;   LOWER EXTREMITY ANGIOGRAPHY Right 03/20/2023   Procedure: Lower Extremity Angiography;  Surgeon: Renford Dills, MD;  Location: [facility];  Service: Cardiovascular;  Laterality: Right;   LOWER EXTREMITY ANGIOGRAPHY Right 10/11/2023   Procedure: Lower Extremity Angiography;  Surgeon: Renford Dills, MD;  Location: [facility];  Service: Cardiovascular;  Laterality: Right;   LOWER EXTREMITY INTERVENTION Right 05/03/2023   Procedure: LOWER EXTREMITY INTERVENTION;  Surgeon: Renford Dills, MD;  Location: [facility];  Service: Cardiovascular;  Laterality: Right;  With Staple removal   Percutaneous transluminal angioplasty  right superficial femoral artery      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   Procedure: TRANSMETATARSAL AMPUTATION;  Surgeon: Recardo Evangelist, DPM;  Location: [facility];  Service: Podiatry;  Laterality: Left;    Family History  Problem Relation Age of Onset   Diabetes Mother    Cancer Father    Hypertension Maternal Grandfather    Heart attack Maternal Grandfather     Allergies  Allergen Reactions   Influenza Virus Vaccine Other (See Comments) and Nausea And Vomiting    Fever, chills, vomiting.        Latest Ref Rng & Units 05/04/2023    6:17 AM 05/03/2023    6:13 AM 05/02/2023    2:58 PM  CBC  WBC 4.0 - 10.5 K/uL 8.1  11.7  12.2   Hemoglobin 13.0 - 17.0 g/dL 8.2  8.4  16.1   Hematocrit 39.0 - 52.0 % 25.5  24.9  31.2   Platelets 150 - 400 K/uL 176  180  203       CMP     Component Value Date/Time   NA 135 05/04/2023 0617   K 4.2 05/04/2023 0617   CL 107 05/04/2023 0617   CO2 21 (L) 05/04/2023 0617   GLUCOSE 96 05/04/2023 0617   BUN 33 (H) 10/11/2023 1025   CREATININE 1.03 10/11/2023 1025   CALCIUM 8.2 (L) 05/04/2023 0617   PROT 7.5 05/02/2023 1458   ALBUMIN 3.7 05/02/2023 1458   AST 24 05/02/2023 1458   ALT 31 05/02/2023 1458   ALKPHOS 61 05/02/2023 1458   BILITOT 0.8 05/02/2023 1458   GFRNONAA >60 10/11/2023 1025     VAS [place] ABI WITH/WO TBI Result Date: 10/07/2023  LOWER EXTREMITY DOPPLER STUDY Patient Name:  [name]  Date of Exam:   10/07/2023 Medical Rec #: [id number]         Accession #:    [id number] Date of Birth: Aug 13, 1941  Patient Gender: M Patient Age:   54 years Exam Location:  [facility] Procedure:      VAS [place] ABI WITH/WO TBI Referring Phys: [name] --------------------------------------------------------------------------------  Indications: Peripheral artery disease. Other Factors: Left 2nd & 3rd toe amputation on 03/28/18.  Vascular Interventions: 03/26/18: Left SFA, popliteal, posterior tibial &                          peroneal artery PTAs                          04/11/2023: Right below knee Popliteal Artery to                         Peroneal Artery Bypass with Reverse Saphenous Vein                         Graft. Comparison Study: 05/10/2023 Performing Technologist: Debbe Bales RVS  Examination Guidelines: A complete evaluation includes at minimum, Doppler waveform signals and systolic blood pressure reading at the level of bilateral brachial, anterior tibial, and posterior tibial arteries, when vessel segments are accessible. Bilateral testing is considered an integral part of a complete examination. Photoelectric Plethysmograph (PPG) waveforms and toe systolic pressure readings are included as required and additional duplex testing as needed. Limited examinations for reoccurring indications may be performed as noted.  ABI Findings: +---------+------------------+-----+----------+--------+ Right    Rt Pressure (mmHg)IndexWaveform  Comment  +---------+------------------+-----+----------+--------+ Brachial 143                                       +---------+------------------+-----+----------+--------+ ATA      78                0.55 monophasic         +---------+------------------+-----+----------+--------+ PTA      70                0.49 monophasic         +---------+------------------+-----+----------+--------+ Great Toe70                0.49 Abnormal           +---------+------------------+-----+----------+--------+ +---------+------------------+-----+-------------------+--------------+ Left     Lt Pressure (mmHg)IndexWaveform           Comment        +---------+------------------+-----+-------------------+--------------+ Brachial 142                                                      +---------+------------------+-----+-------------------+--------------+ ATA      64                0.45 dampened monophasic                +---------+------------------+-----+-------------------+--------------+ PTA      119               0.83 monophasic                        +---------+------------------+-----+-------------------+--------------+ Great Toe  Toes Amputated +---------+------------------+-----+-------------------+--------------+ +-------+-----------+-----------+------------+------------+ ABI/TBIToday's ABIToday's TBIPrevious ABIPrevious TBI +-------+-----------+-----------+------------+------------+ Right  .55        .49        .61                      +-------+-----------+-----------+------------+------------+ Left   .83                   .67                      +-------+-----------+-----------+------------+------------+ Right ABIs appear decreased compared to prior study on 05/10/2023.  Summary: Right: Resting right ankle-brachial index indicates moderate right lower extremity arterial disease. The right toe-brachial index is abnormal. Left: Resting left ankle-brachial index indicates mild left lower extremity arterial disease. *See table(s) above for measurements and observations.  Electronically signed by Devon Fogo MD on 10/07/2023 at 4:11:40 PM.    Final        Assessment & Plan:   1. Atherosclerosis of native arteries of the extremities with ulceration (HCC) (Primary) The patient as well as wound care records it appears that the wound is healing.  There is some concern with the monophasic waveforms that the flow may be decreased but the wound is showing signs of improvement.  I hesitate to move forward with angiogram as this can lead to some lower extremity swelling which can actually worsen his wounds.  Will continue to maintain a very close follow-up and have him return in approximate 6 weeks or sooner if the wound stalls or regresses 2. Mixed hyperlipidemia Continue statin as ordered and reviewed, no changes at this time  3. Type 2 diabetes  mellitus with diabetic peripheral angiopathy and gangrene, without long-term current use of insulin (HCC) Continue hypoglycemic medications as already ordered, these medications have been reviewed and there are no changes at this time.  Hgb A1C to be monitored as already arranged by primary service   Current Outpatient Medications on File Prior to Visit  Medication Sig Dispense Refill   acetaminophen (TYLENOL) 325 MG tablet Take 2 tablets (650 mg total) by mouth every 6 (six) hours as needed for mild pain or fever. 20 tablet 0   aspirin EC 81 MG tablet Take 1 tablet (81 mg total) by mouth daily at 6 (six) AM. Swallow whole. 30 tablet 12   clopidogrel (PLAVIX) 75 MG tablet Take 75 mg by mouth daily.     docusate sodium (COLACE) 100 MG capsule Take 1 capsule (100 mg total) by mouth daily. 10 capsule 0   levofloxacin (LEVAQUIN) 750 MG tablet Take 750 mg by mouth daily.     Multiple Vitamin (MULTIVITAMIN) tablet Take 1 tablet by mouth 3 (three) times a week.     doxycycline (VIBRA-TABS) 100 MG tablet Take 1 tablet (100 mg total) by mouth 2 (two) times daily. (Patient not taking: Reported on 06/04/2023) 20 tablet 0   metoprolol tartrate (LOPRESSOR) 25 MG tablet Take tablet TWO hours prior to his cardiac CT scan. (Patient not taking: Reported on 10/11/2023) 30 tablet 0   oxyCODONE-acetaminophen (PERCOCET/ROXICET) 5-325 MG tablet Take 1-2 tablets by mouth every 4 (four) hours as needed for moderate pain. (Patient not taking: Reported on 12/27/2023) 30 tablet 0   rosuvastatin (CRESTOR) 10 MG tablet Take 1 tablet (10 mg total) by mouth daily. (Patient not taking: Reported on 10/11/2023) 30 tablet 11   valsartan (DIOVAN) 80 MG tablet Take 1 tablet (80 mg total) by mouth daily. (Patient  not taking: Reported on 10/11/2023) 30 tablet 0   No current facility-administered medications on file prior to visit.    There are no Patient Instructions on file for this visit. No follow-ups on file.   Doil Kamara E Kaycee Mcgaugh,  NP

## 2023-12-31 ENCOUNTER — Encounter: Admitting: Physician Assistant

## 2023-12-31 DIAGNOSIS — T8131XA Disruption of external operation (surgical) wound, not elsewhere classified, initial encounter: Secondary | ICD-10-CM | POA: Diagnosis not present

## 2024-01-01 ENCOUNTER — Encounter: Admitting: Physician Assistant

## 2024-01-01 DIAGNOSIS — T8131XA Disruption of external operation (surgical) wound, not elsewhere classified, initial encounter: Secondary | ICD-10-CM | POA: Diagnosis not present

## 2024-01-01 LAB — VAS US ABI WITH/WO TBI: Left ABI: 0.67

## 2024-01-02 ENCOUNTER — Encounter: Admitting: Physician Assistant

## 2024-01-02 DIAGNOSIS — E11621 Type 2 diabetes mellitus with foot ulcer: Secondary | ICD-10-CM | POA: Diagnosis not present

## 2024-01-02 DIAGNOSIS — N183 Chronic kidney disease, stage 3 unspecified: Secondary | ICD-10-CM | POA: Diagnosis not present

## 2024-01-02 DIAGNOSIS — I2510 Atherosclerotic heart disease of native coronary artery without angina pectoris: Secondary | ICD-10-CM | POA: Diagnosis not present

## 2024-01-02 DIAGNOSIS — E1122 Type 2 diabetes mellitus with diabetic chronic kidney disease: Secondary | ICD-10-CM | POA: Diagnosis not present

## 2024-01-02 DIAGNOSIS — M86371 Chronic multifocal osteomyelitis, right ankle and foot: Secondary | ICD-10-CM | POA: Diagnosis not present

## 2024-01-02 DIAGNOSIS — I73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: Secondary | ICD-10-CM | POA: Diagnosis not present

## 2024-01-02 DIAGNOSIS — L97513 Non-pressure chronic ulcer of other part of right foot with necrosis of muscle: Secondary | ICD-10-CM | POA: Diagnosis not present

## 2024-01-02 DIAGNOSIS — L97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 ulcer of other part of right foot with fat layer exposed: Secondary | ICD-10-CM | POA: Diagnosis not present

## 2024-01-02 DIAGNOSIS — E1169 Type 2 diabetes mellitus with other specified complication: Secondary | ICD-10-CM | POA: Diagnosis not present

## 2024-01-02 DIAGNOSIS — T8131XA Disruption of external operation (surgical) wound, not elsewhere classified, initial encounter: Secondary | ICD-10-CM | POA: Diagnosis present

## 2024-01-02 DIAGNOSIS — I129 Hypertensive chronic kidney disease with stage 1 through stage 4 chronic kidney disease, or unspecified chronic kidney disease: Secondary | ICD-10-CM | POA: Diagnosis not present

## 2024-01-02 DIAGNOSIS — X58XXXA Exposure to other specified factors, initial encounter: Secondary | ICD-10-CM | POA: Diagnosis not present

## 2024-01-02 DIAGNOSIS — Z7901 Long term (current) use of anticoagulants: Secondary | ICD-10-CM | POA: Diagnosis not present

## 2024-01-03 ENCOUNTER — Encounter: Admitting: Physician Assistant

## 2024-01-06 ENCOUNTER — Encounter: Admitting: Physician Assistant

## 2024-01-06 DIAGNOSIS — I73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: Secondary | ICD-10-CM | POA: Diagnosis not present

## 2024-01-06 DIAGNOSIS — X58XXXA Exposure to other specified factors, initial encounter: Secondary | ICD-10-CM | POA: Diagnosis not present

## 2024-01-06 DIAGNOSIS — L97513 Non-pressure chronic ulcer of other part of right foot with necrosis of muscle: Secondary | ICD-10-CM | POA: Diagnosis not present

## 2024-01-06 DIAGNOSIS — M86371 Chronic multifocal osteomyelitis, right ankle and foot: Secondary | ICD-10-CM | POA: Diagnosis not present

## 2024-01-06 DIAGNOSIS — E1169 Type 2 diabetes mellitus with other specified complication: Secondary | ICD-10-CM | POA: Diagnosis not present

## 2024-01-06 DIAGNOSIS — E11621 Type 2 diabetes mellitus with foot ulcer: Secondary | ICD-10-CM | POA: Diagnosis not present

## 2024-01-06 DIAGNOSIS — I2510 Atherosclerotic heart disease of native coronary artery without angina pectoris: Secondary | ICD-10-CM | POA: Diagnosis not present

## 2024-01-06 DIAGNOSIS — L97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 ulcer of other part of right foot with fat layer exposed: Secondary | ICD-10-CM | POA: Diagnosis not present

## 2024-01-06 DIAGNOSIS — I129 Hypertensive chronic kidney disease with stage 1 through stage 4 chronic kidney disease, or unspecified chronic kidney disease: Secondary | ICD-10-CM | POA: Diagnosis not present

## 2024-01-06 DIAGNOSIS — E1122 Type 2 diabetes mellitus with diabetic chronic kidney disease: Secondary | ICD-10-CM | POA: Diagnosis not present

## 2024-01-06 DIAGNOSIS — T8131XA Disruption of external operation (surgical) wound, not elsewhere classified, initial encounter: Secondary | ICD-10-CM | POA: Diagnosis present

## 2024-01-06 DIAGNOSIS — Z7901 Long term (current) use of anticoagulants: Secondary | ICD-10-CM | POA: Diagnosis not present

## 2024-01-06 DIAGNOSIS — N183 Chronic kidney disease, stage 3 unspecified: Secondary | ICD-10-CM | POA: Diagnosis not present

## 2024-01-07 ENCOUNTER — Encounter: Admitting: Physician Assistant

## 2024-01-07 DIAGNOSIS — T8131XA Disruption of external operation (surgical) wound, not elsewhere classified, initial encounter: Secondary | ICD-10-CM | POA: Diagnosis not present

## 2024-01-08 ENCOUNTER — Encounter: Admitting: Physician Assistant

## 2024-01-08 DIAGNOSIS — T8131XA Disruption of external operation (surgical) wound, not elsewhere classified, initial encounter: Secondary | ICD-10-CM | POA: Diagnosis not present

## 2024-01-09 ENCOUNTER — Encounter: Admitting: Physician Assistant

## 2024-01-09 DIAGNOSIS — T8131XA Disruption of external operation (surgical) wound, not elsewhere classified, initial encounter: Secondary | ICD-10-CM | POA: Diagnosis not present

## 2024-01-10 ENCOUNTER — Encounter: Admitting: Physician Assistant

## 2024-01-10 DIAGNOSIS — T8131XA Disruption of external operation (surgical) wound, not elsewhere classified, initial encounter: Secondary | ICD-10-CM | POA: Diagnosis not present

## 2024-01-13 ENCOUNTER — Encounter: Admitting: Physician Assistant

## 2024-01-13 DIAGNOSIS — T8131XA Disruption of external operation (surgical) wound, not elsewhere classified, initial encounter: Secondary | ICD-10-CM | POA: Diagnosis not present

## 2024-01-14 ENCOUNTER — Encounter: Admitting: Physician Assistant

## 2024-01-15 ENCOUNTER — Encounter: Admitting: Physician Assistant

## 2024-01-15 DIAGNOSIS — T8131XA Disruption of external operation (surgical) wound, not elsewhere classified, initial encounter: Secondary | ICD-10-CM | POA: Diagnosis not present

## 2024-01-16 ENCOUNTER — Encounter: Admitting: Physician Assistant

## 2024-01-16 ENCOUNTER — Encounter: Attending: Physician Assistant | Admitting: Physician Assistant

## 2024-01-16 ENCOUNTER — Ambulatory Visit: Admitting: Physician Assistant

## 2024-01-16 DIAGNOSIS — E1169 Type 2 diabetes mellitus with other specified complication: Secondary | ICD-10-CM | POA: Insufficient documentation

## 2024-01-16 DIAGNOSIS — Z7901 Long term (current) use of anticoagulants: Secondary | ICD-10-CM | POA: Insufficient documentation

## 2024-01-16 DIAGNOSIS — I129 Hypertensive chronic kidney disease with stage 1 through stage 4 chronic kidney disease, or unspecified chronic kidney disease: Secondary | ICD-10-CM | POA: Insufficient documentation

## 2024-01-16 DIAGNOSIS — E11621 Type 2 diabetes mellitus with foot ulcer: Secondary | ICD-10-CM | POA: Diagnosis not present

## 2024-01-16 DIAGNOSIS — E1122 Type 2 diabetes mellitus with diabetic chronic kidney disease: Secondary | ICD-10-CM | POA: Diagnosis not present

## 2024-01-16 DIAGNOSIS — T8131XA Disruption of external operation (surgical) wound, not elsewhere classified, initial encounter: Secondary | ICD-10-CM | POA: Diagnosis present

## 2024-01-16 DIAGNOSIS — N183 Chronic kidney disease, stage 3 unspecified: Secondary | ICD-10-CM | POA: Insufficient documentation

## 2024-01-16 DIAGNOSIS — L97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 ulcer of other part of right foot with fat layer exposed: Secondary | ICD-10-CM | POA: Insufficient documentation

## 2024-01-16 DIAGNOSIS — I2510 Atherosclerotic heart disease of native coronary artery without angina pectoris: Secondary | ICD-10-CM | POA: Insufficient documentation

## 2024-01-16 DIAGNOSIS — M86371 Chronic multifocal osteomyelitis, right ankle and foot: Secondary | ICD-10-CM | POA: Insufficient documentation

## 2024-01-16 DIAGNOSIS — I73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: Secondary | ICD-10-CM | POA: Diagnosis not present

## 2024-01-16 DIAGNOSIS — L97513 Non-pressure chronic ulcer of other part of right foot with necrosis of muscle: Secondary | ICD-10-CM | POA: Insufficient documentation

## 2024-01-17 ENCOUNTER — Ambulatory Visit: Admitting: Physician Assistant

## 2024-01-17 ENCOUNTER — Encounter: Admitting: Physician Assistant

## 2024-01-17 DIAGNOSIS — T8131XA Disruption of external operation (surgical) wound, not elsewhere classified, initial encounter: Secondary | ICD-10-CM | POA: Diagnosis not present

## 2024-01-20 ENCOUNTER — Encounter: Admitting: Physician Assistant

## 2024-01-20 DIAGNOSIS — T8131XA Disruption of external operation (surgical) wound, not elsewhere classified, initial encounter: Secondary | ICD-10-CM | POA: Diagnosis not present

## 2024-01-21 ENCOUNTER — Encounter: Admitting: Physician Assistant

## 2024-01-21 DIAGNOSIS — T8131XA Disruption of external operation (surgical) wound, not elsewhere classified, initial encounter: Secondary | ICD-10-CM | POA: Diagnosis not present

## 2024-01-22 ENCOUNTER — Encounter: Admitting: Physician Assistant

## 2024-01-22 DIAGNOSIS — T8131XA Disruption of external operation (surgical) wound, not elsewhere classified, initial encounter: Secondary | ICD-10-CM | POA: Diagnosis not present

## 2024-01-23 ENCOUNTER — Encounter: Admitting: Physician Assistant

## 2024-01-23 DIAGNOSIS — T8131XA Disruption of external operation (surgical) wound, not elsewhere classified, initial encounter: Secondary | ICD-10-CM | POA: Diagnosis not present

## 2024-01-24 ENCOUNTER — Encounter: Admitting: Physician Assistant

## 2024-01-24 DIAGNOSIS — T8131XA Disruption of external operation (surgical) wound, not elsewhere classified, initial encounter: Secondary | ICD-10-CM | POA: Diagnosis not present

## 2024-01-27 ENCOUNTER — Encounter: Admitting: Physician Assistant

## 2024-01-27 DIAGNOSIS — T8131XA Disruption of external operation (surgical) wound, not elsewhere classified, initial encounter: Secondary | ICD-10-CM | POA: Diagnosis not present

## 2024-01-30 ENCOUNTER — Encounter: Admitting: Physician Assistant

## 2024-01-30 DIAGNOSIS — T8131XA Disruption of external operation (surgical) wound, not elsewhere classified, initial encounter: Secondary | ICD-10-CM | POA: Diagnosis not present

## 2024-02-04 ENCOUNTER — Encounter (INDEPENDENT_AMBULATORY_CARE_PROVIDER_SITE_OTHER): Payer: Self-pay

## 2024-02-05 ENCOUNTER — Other Ambulatory Visit (INDEPENDENT_AMBULATORY_CARE_PROVIDER_SITE_OTHER): Payer: Self-pay | Admitting: Nurse Practitioner

## 2024-02-05 DIAGNOSIS — I7025 Atherosclerosis of native arteries of other extremities with ulceration: Secondary | ICD-10-CM

## 2024-02-06 ENCOUNTER — Encounter: Admitting: Physician Assistant

## 2024-02-06 DIAGNOSIS — T8131XA Disruption of external operation (surgical) wound, not elsewhere classified, initial encounter: Secondary | ICD-10-CM | POA: Diagnosis not present

## 2024-02-07 ENCOUNTER — Ambulatory Visit (INDEPENDENT_AMBULATORY_CARE_PROVIDER_SITE_OTHER)

## 2024-02-07 ENCOUNTER — Ambulatory Visit (INDEPENDENT_AMBULATORY_CARE_PROVIDER_SITE_OTHER): Admitting: Nurse Practitioner

## 2024-02-07 ENCOUNTER — Encounter (INDEPENDENT_AMBULATORY_CARE_PROVIDER_SITE_OTHER): Payer: Self-pay | Admitting: Nurse Practitioner

## 2024-02-07 VITALS — BP 152/76 | HR 66 | Resp 18 | Ht 68.0 in | Wt 168.0 lb

## 2024-02-07 DIAGNOSIS — E782 Mixed hyperlipidemia: Secondary | ICD-10-CM | POA: Diagnosis not present

## 2024-02-07 DIAGNOSIS — I7025 Atherosclerosis of native arteries of other extremities with ulceration: Secondary | ICD-10-CM

## 2024-02-07 DIAGNOSIS — E1152 Type 2 diabetes mellitus with diabetic peripheral angiopathy with gangrene: Secondary | ICD-10-CM

## 2024-02-10 NOTE — Progress Notes (Signed)
 Subjective:    Patient ID: [name], male    DOB: 20-Apr-1941, 83 y.o.   MRN: [id number] Chief Complaint  Patient presents with   Follow-up    fu 6 weeks + R LEA    The patient returns to the office for followup and review status post angiogram with intervention on 10/11/2023.   Procedure:  Procedure(s) Performed:             1.  Introduction catheter into right lower extremity 3rd order catheter placement               2.    Contrast injection right lower extremity for distal runoff             3.  Percutaneous transluminal angioplasty and stent placement right popliteal arteries to 5 mm             4.  Percutaneous transluminal angioplasty and stent placement tibioperoneal trunk and peroneal to 4mm             5.  Star close closure left common femoral arteriotomy   The patient notes improvement in the lower extremity symptoms. No interval shortening of the patient's claudication distance or rest pain symptoms. No new ulcers or wounds have occurred since the last visit.  Per notes from wound care it has been healing well and most of his wounds are closed.  There have been no significant changes to the patient's overall health care.  No documented history of amaurosis fugax or recent TIA symptoms. There are no recent neurological changes noted. No documented history of DVT, PE or superficial thrombophlebitis. The patient denies recent episodes of angina or shortness of breath.   Unable to obtain ABIs of the right lower extremity due to dressings placed this morning by wound care however arterial duplex has primarily biphasic triphasic waveforms through the lower extremity and tibial it reaches the distal vessels.  Previously placed stent is patent with monophasic waveforms.  The left has some stenosis within the tibial vessels    Review of Systems  Cardiovascular:  Positive for leg swelling.  Skin:  Positive for wound.  Neurological:  Positive for weakness.  All other  systems reviewed and are negative.      Objective:   Physical Exam Vitals reviewed.  HENT:     Head: Normocephalic.  Cardiovascular:     Rate and Rhythm: Normal rate.  Pulmonary:     Effort: Pulmonary effort is normal.  Musculoskeletal:     Right lower leg: Edema present.  Skin:    General: Skin is warm and dry.  Neurological:     Mental Status: He is alert and oriented to person, place, and time.  Psychiatric:        Mood and Affect: Mood normal.        Behavior: Behavior normal.        Thought Content: Thought content normal.        Judgment: Judgment normal.     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   a.) TTE 08/27/2022: Ao sinus 4.1, asc Ao 3.7, ST junction 3.3   Atherosclerotic PVD with intermittent claudication (HCC)  a.) s/p vascular intervention 03/26/2018:  post tibial and peroneal crosser athrectomy, PTA LEFT post tibial, SFA, and popliteal arteries; b.) s/p PTA 03/20/2023: RIGHT SFA and popliteal arteries   Bilateral carotid artery disease (HCC) 08/22/2022   a.) carotid doppler 08/22/2022: <50 LICA, 50-69% RICA   Blurry vision, bilateral    CAD (coronary artery disease) 09/21/2022   a.) cCTA 09/21/2022: 591 (57 %ile for age/sex/race matched control)   Chest pain    Chronic heart failure with preserved ejection fraction (HFpEF) (HCC)    a.) TTE 08/27/2022: EF >55%, aym LVH, mild LAE, triv AR/PR, mild MR/TR. mild AS, Ao root dilatation, G1DD   CKD (chronic kidney disease), stage III (HCC)    Gangrene of toe of right foot (HCC)    a.) RIGHT second toe   Heart murmur    History of bilateral cataract extraction 2020   HLD (hyperlipidemia)    Hypertension    Incomplete right bundle branch block (RBBB)    Long term current use of  antithrombotics/antiplatelets    a.) clopidogrel    Neuropathy    Palpitations    T2DM (type 2 diabetes mellitus) (HCC)    Vertigo    Wears dentures    Full upper and lower (loose)    Social History   Socioeconomic History   Marital status: Married    Spouse name: Not on file   Number of children: Not on file   Years of education: Not on file   Highest education level: Not on file  Occupational History   Not on file  Tobacco Use   Smoking status: Former    Current packs/day: 0.00    Types: Cigarettes    Quit date: 1985    Years since quitting: 40.4   Smokeless tobacco: Current    Types: Chew   Tobacco comments:    Sometime I chew tobacco and sometime I don't.  DJM 09/02/2024  Vaping Use   Vaping status: Never Used  Substance and Sexual Activity   Alcohol use: Yes    Comment: once a month or once every 2-3 mos   Drug use: Never   Sexual activity: Never  Other Topics Concern   Not on file  Social History Narrative   Not on file   Social Drivers of Health   Financial Resource Strain: Low Risk  (01/10/2023)   Received from [facility]   Overall Financial Resource Strain (CARDIA)    Difficulty of Paying Living Expenses: Not very hard  Food Insecurity: No Food Insecurity (05/03/2023)   Hunger Vital Sign    Worried About Running Out of Food in the Last Year: Never true    Ran Out of Food in the Last Year: Never true  Transportation Needs: No Transportation Needs (05/03/2023)   PRAPARE - Administrator, Civil Service (Medical): No    Lack of Transportation (Non-Medical): No  Physical Activity: Not on file  Stress: Not on file  Social Connections: Not on file  Intimate Partner Violence: Not At Risk (05/03/2023)   Humiliation, Afraid, Rape, and Kick questionnaire    Fear of Current or Ex-Partner: No    Emotionally Abused: No    Physically Abused: No    Sexually Abused: No    Past Surgical History:  Procedure Laterality Date    AMPUTATION TOE Left 03/28/2018   Procedure: AMPUTATION TOE/ PARTIAL RAY RESECTION-LEFT 2ND AND 3RD;  Surgeon: Angel Barba, DPM;  Location: [facility];  Service: Podiatry;  Laterality: Left;  AMPUTATION TOE Right 04/16/2023   Procedure: AMPUTATION TOE;  Surgeon: Dot Gazella, DPM;  Location: [facility];  Service: Orthopedics/Podiatry;  Laterality: Right;   CATARACT EXTRACTION W/PHACO Left 02/10/2019   Procedure: CATARACT EXTRACTION PHACO AND INTRAOCULAR LENS PLACEMENT (IOC)  LEFT DIABETIC;  Surgeon: Rosa College, MD;  Location: [facility];  Service: Ophthalmology;  Laterality: Left;  Diabetic - oral meds   CATARACT EXTRACTION W/PHACO Right 04/20/2019   Procedure: CATARACT EXTRACTION PHACO AND INTRAOCULAR LENS PLACEMENT (IOC)  RIGHT DIABETIC;  Surgeon: Rosa College, MD;  Location: [facility];  Service: Ophthalmology;  Laterality: Right;   FEMORAL-TIBIAL BYPASS GRAFT Right 04/11/2023   Procedure: BYPASS GRAFT FEMORAL-TIBIAL ARTERY ( POPLITEAL TO PERONEAL);  Surgeon: Jackquelyn Mass, MD;  Location: [facility];  Service: Vascular;  Laterality: Right;   HERNIA REPAIR     LOWER EXTREMITY ANGIOGRAPHY Left 03/26/2018   Procedure: Lower Extremity Angiography;  Surgeon: Jackquelyn Mass, MD;  Location: [facility];  Service: Cardiovascular;  Laterality: Left;   LOWER EXTREMITY ANGIOGRAPHY Right 03/20/2023   Procedure: Lower Extremity Angiography;  Surgeon: Jackquelyn Mass, MD;  Location: [facility];  Service: Cardiovascular;  Laterality: Right;   LOWER EXTREMITY ANGIOGRAPHY Right 10/11/2023   Procedure: Lower Extremity Angiography;  Surgeon: Jackquelyn Mass, MD;  Location: [facility];  Service: Cardiovascular;  Laterality: Right;   LOWER EXTREMITY INTERVENTION Right 05/03/2023   Procedure: LOWER EXTREMITY INTERVENTION;  Surgeon: Jackquelyn Mass, MD;  Location: [facility];  Service: Cardiovascular;  Laterality: Right;  With Staple  removal   Percutaneous transluminal angioplasty right superficial femoral artery      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   Procedure: TRANSMETATARSAL AMPUTATION;  Surgeon: Sharlyn Deaner, DPM;  Location: [facility];  Service: Podiatry;  Laterality: Left;    Family History  Problem Relation Age of Onset   Diabetes Mother    Cancer Father    Hypertension Maternal Grandfather    Heart attack Maternal Grandfather     Allergies  Allergen Reactions   Influenza Virus Vaccine Other (See Comments) and Nausea And Vomiting    Fever, chills, vomiting.        Latest Ref Rng & Units 05/04/2023    6:17 AM 05/03/2023    6:13 AM 05/02/2023    2:58 PM  CBC  WBC 4.0 - 10.5 K/uL 8.1  11.7  12.2   Hemoglobin 13.0 - 17.0 g/dL 8.2  8.4  16.1   Hematocrit 39.0 - 52.0 % 25.5  24.9  31.2   Platelets 150 - 400 K/uL 176  180  203       CMP     Component Value Date/Time   NA 135 05/04/2023 0617   K 4.2 05/04/2023 0617   CL 107 05/04/2023 0617   CO2 21 (L) 05/04/2023 0617   GLUCOSE 96 05/04/2023 0617   BUN 33 (H) 10/11/2023 1025   CREATININE 1.03 10/11/2023 1025   CALCIUM  8.2 (L) 05/04/2023 0617   PROT 7.5 05/02/2023 1458   ALBUMIN  3.7 05/02/2023 1458   AST 24 05/02/2023 1458   ALT 31 05/02/2023 1458   ALKPHOS 61 05/02/2023 1458   BILITOT 0.8 05/02/2023 1458   GFRNONAA >60 10/11/2023 1025     VAS US  ABI WITH/WO TBI Result Date: 10/07/2023  LOWER EXTREMITY DOPPLER STUDY Patient Name:  LEOPOLD SMYERS  Date of Exam:   10/07/2023 Medical Rec #: [id number]  Accession #:    [id number] Date of Birth: 1941/08/17         Patient Gender: M Patient Age:   22 years Exam Location:  [facility] Procedure:      VAS US  ABI WITH/WO TBI Referring Phys: Devon Fogo --------------------------------------------------------------------------------  Indications: Peripheral artery disease. Other Factors: Left 2nd & 3rd toe amputation on 03/28/18.  Vascular Interventions: 03/26/18: Left  SFA, popliteal, posterior tibial &                         peroneal artery PTAs                          04/11/2023: Right below knee Popliteal Artery to                         Peroneal Artery Bypass with Reverse Saphenous Vein                         Graft. Comparison Study: 05/10/2023 Performing Technologist: Tonie Franks RVS  Examination Guidelines: A complete evaluation includes at minimum, Doppler waveform signals and systolic blood pressure reading at the level of bilateral brachial, anterior tibial, and posterior tibial arteries, when vessel segments are accessible. Bilateral testing is considered an integral part of a complete examination. Photoelectric Plethysmograph (PPG) waveforms and toe systolic pressure readings are included as required and additional duplex testing as needed. Limited examinations for reoccurring indications may be performed as noted.  ABI Findings: +---------+------------------+-----+----------+--------+ Right    Rt Pressure (mmHg)IndexWaveform  Comment  +---------+------------------+-----+----------+--------+ Brachial 143                                       +---------+------------------+-----+----------+--------+ ATA      78                0.55 monophasic         +---------+------------------+-----+----------+--------+ PTA      70                0.49 monophasic         +---------+------------------+-----+----------+--------+ Great Toe70                0.49 Abnormal           +---------+------------------+-----+----------+--------+ +---------+------------------+-----+-------------------+--------------+ Left     Lt Pressure (mmHg)IndexWaveform           Comment        +---------+------------------+-----+-------------------+--------------+ Brachial 142                                                      +---------+------------------+-----+-------------------+--------------+ ATA      64                0.45 dampened monophasic                +---------+------------------+-----+-------------------+--------------+ PTA      119               0.83 monophasic                        +---------+------------------+-----+-------------------+--------------+ Great Toe  Toes Amputated +---------+------------------+-----+-------------------+--------------+ +-------+-----------+-----------+------------+------------+ ABI/TBIToday's ABIToday's TBIPrevious ABIPrevious TBI +-------+-----------+-----------+------------+------------+ Right  .55        .49        .61                      +-------+-----------+-----------+------------+------------+ Left   .83                   .67                      +-------+-----------+-----------+------------+------------+ Right ABIs appear decreased compared to prior study on 05/10/2023.  Summary: Right: Resting right ankle-brachial index indicates moderate right lower extremity arterial disease. The right toe-brachial index is abnormal. Left: Resting left ankle-brachial index indicates mild left lower extremity arterial disease. *See table(s) above for measurements and observations.  Electronically signed by Devon Fogo MD on 10/07/2023 at 4:11:40 PM.    Final        Assessment & Plan:   1. Atherosclerosis of native arteries of the extremities with ulceration (HCC) (Primary) The patient as well as wound care records it appears that the wound is healing.  There is some concern with the monophasic waveforms that the flow may be decreased but the wound is showing signs of improvement.  I hesitate to move forward with angiogram as this can lead to some lower extremity swelling which can actually worsen his wounds.  Will continue to maintain a very close follow-up and have him return in approximate 6 weeks or sooner if the wound stalls or regresses 2. Mixed hyperlipidemia Continue statin as ordered and reviewed, no changes at this time  3. Type 2  diabetes mellitus with diabetic peripheral angiopathy and gangrene, without long-term current use of insulin  (HCC) Continue hypoglycemic medications as already ordered, these medications have been reviewed and there are no changes at this time.  Hgb A1C to be monitored as already arranged by primary service   Current Outpatient Medications on File Prior to Visit  Medication Sig Dispense Refill   acetaminophen  (TYLENOL ) 325 MG tablet Take 2 tablets (650 mg total) by mouth every 6 (six) hours as needed for mild pain or fever. 20 tablet 0   aspirin  EC 81 MG tablet Take 1 tablet (81 mg total) by mouth daily at 6 (six) AM. Swallow whole. 30 tablet 12   clopidogrel  (PLAVIX ) 75 MG tablet Take 75 mg by mouth daily.     levofloxacin (LEVAQUIN) 750 MG tablet Take 750 mg by mouth daily.     Multiple Vitamin (MULTIVITAMIN) tablet Take 1 tablet by mouth 3 (three) times a week.     oxyCODONE -acetaminophen  (PERCOCET/ROXICET) 5-325 MG tablet Take 1-2 tablets by mouth every 4 (four) hours as needed for moderate pain. 30 tablet 0   rosuvastatin  (CRESTOR ) 10 MG tablet Take 1 tablet (10 mg total) by mouth daily. 30 tablet 11   docusate sodium  (COLACE) 100 MG capsule Take 1 capsule (100 mg total) by mouth daily. (Patient not taking: Reported on 02/07/2024) 10 capsule 0   doxycycline  (VIBRA -TABS) 100 MG tablet Take 1 tablet (100 mg total) by mouth 2 (two) times daily. (Patient not taking: Reported on 06/04/2023) 20 tablet 0   metoprolol  tartrate (LOPRESSOR ) 25 MG tablet Take tablet TWO hours prior to his cardiac CT scan. (Patient not taking: Reported on 02/07/2024) 30 tablet 0   valsartan  (DIOVAN ) 80 MG tablet Take 1 tablet (80 mg total) by mouth daily. (Patient not taking: Reported on 02/07/2024) 30  tablet 0   No current facility-administered medications on file prior to visit.    There are no Patient Instructions on file for this visit. No follow-ups on file.   Laura-Lee Villegas E Leah Thornberry, NP

## 2024-02-14 ENCOUNTER — Encounter: Admitting: Physician Assistant

## 2024-02-14 DIAGNOSIS — T8131XA Disruption of external operation (surgical) wound, not elsewhere classified, initial encounter: Secondary | ICD-10-CM | POA: Diagnosis not present

## 2024-02-24 ENCOUNTER — Encounter: Attending: Physician Assistant | Admitting: Physician Assistant

## 2024-02-24 DIAGNOSIS — N183 Chronic kidney disease, stage 3 unspecified: Secondary | ICD-10-CM | POA: Diagnosis not present

## 2024-02-24 DIAGNOSIS — L97514 Non-pressure chronic ulcer of other part of right foot with necrosis of bone: Secondary | ICD-10-CM | POA: Diagnosis not present

## 2024-02-24 DIAGNOSIS — I129 Hypertensive chronic kidney disease with stage 1 through stage 4 chronic kidney disease, or unspecified chronic kidney disease: Secondary | ICD-10-CM | POA: Insufficient documentation

## 2024-02-24 DIAGNOSIS — E11621 Type 2 diabetes mellitus with foot ulcer: Secondary | ICD-10-CM | POA: Insufficient documentation

## 2024-02-24 DIAGNOSIS — Z7902 Long term (current) use of antithrombotics/antiplatelets: Secondary | ICD-10-CM | POA: Insufficient documentation

## 2024-02-24 DIAGNOSIS — M86371 Chronic multifocal osteomyelitis, right ankle and foot: Secondary | ICD-10-CM | POA: Insufficient documentation

## 2024-02-24 DIAGNOSIS — I2510 Atherosclerotic heart disease of native coronary artery without angina pectoris: Secondary | ICD-10-CM | POA: Insufficient documentation

## 2024-02-24 DIAGNOSIS — E1122 Type 2 diabetes mellitus with diabetic chronic kidney disease: Secondary | ICD-10-CM | POA: Diagnosis not present

## 2024-02-24 DIAGNOSIS — L97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 ulcer of other part of right foot with fat layer exposed: Secondary | ICD-10-CM | POA: Diagnosis not present

## 2024-02-24 DIAGNOSIS — Z7901 Long term (current) use of anticoagulants: Secondary | ICD-10-CM | POA: Diagnosis not present

## 2024-02-24 DIAGNOSIS — X58XXXA Exposure to other specified factors, initial encounter: Secondary | ICD-10-CM | POA: Insufficient documentation

## 2024-02-24 DIAGNOSIS — S81811A Laceration without foreign body, right lower leg, initial encounter: Secondary | ICD-10-CM | POA: Diagnosis not present

## 2024-02-24 DIAGNOSIS — E1151 Type 2 diabetes mellitus with diabetic peripheral angiopathy without gangrene: Secondary | ICD-10-CM | POA: Insufficient documentation

## 2024-02-24 DIAGNOSIS — T8131XA Disruption of external operation (surgical) wound, not elsewhere classified, initial encounter: Secondary | ICD-10-CM | POA: Diagnosis not present

## 2024-03-02 ENCOUNTER — Encounter

## 2024-03-02 DIAGNOSIS — M86371 Chronic multifocal osteomyelitis, right ankle and foot: Secondary | ICD-10-CM | POA: Diagnosis not present

## 2024-03-09 ENCOUNTER — Encounter: Admitting: Physician Assistant

## 2024-03-09 DIAGNOSIS — M86371 Chronic multifocal osteomyelitis, right ankle and foot: Secondary | ICD-10-CM | POA: Diagnosis not present

## 2024-03-16 ENCOUNTER — Encounter: Admitting: Physician Assistant

## 2024-03-16 DIAGNOSIS — M86371 Chronic multifocal osteomyelitis, right ankle and foot: Secondary | ICD-10-CM | POA: Diagnosis not present

## 2024-03-25 ENCOUNTER — Other Ambulatory Visit (INDEPENDENT_AMBULATORY_CARE_PROVIDER_SITE_OTHER): Payer: Self-pay | Admitting: Nurse Practitioner

## 2024-03-25 DIAGNOSIS — I7025 Atherosclerosis of native arteries of other extremities with ulceration: Secondary | ICD-10-CM

## 2024-03-26 ENCOUNTER — Encounter (INDEPENDENT_AMBULATORY_CARE_PROVIDER_SITE_OTHER): Payer: Self-pay | Admitting: Nurse Practitioner

## 2024-03-26 ENCOUNTER — Other Ambulatory Visit (INDEPENDENT_AMBULATORY_CARE_PROVIDER_SITE_OTHER)

## 2024-03-26 ENCOUNTER — Ambulatory Visit (INDEPENDENT_AMBULATORY_CARE_PROVIDER_SITE_OTHER): Admitting: Nurse Practitioner

## 2024-03-26 ENCOUNTER — Ambulatory Visit: Admitting: Physician Assistant

## 2024-03-26 VITALS — BP 169/79 | HR 58 | Resp 16

## 2024-03-26 DIAGNOSIS — E1152 Type 2 diabetes mellitus with diabetic peripheral angiopathy with gangrene: Secondary | ICD-10-CM | POA: Diagnosis not present

## 2024-03-26 DIAGNOSIS — I7025 Atherosclerosis of native arteries of other extremities with ulceration: Secondary | ICD-10-CM

## 2024-03-26 DIAGNOSIS — E782 Mixed hyperlipidemia: Secondary | ICD-10-CM

## 2024-03-26 MED ORDER — FUROSEMIDE 20 MG PO TABS: 20.0000 mg | ORAL_TABLET | Freq: Every day | ORAL | 0 refills | Status: AC

## 2024-03-26 MED ORDER — SULFAMETHOXAZOLE-TRIMETHOPRIM 800-160 MG PO TABS: 1.0000 | ORAL_TABLET | Freq: Two times a day (BID) | ORAL | 0 refills | Status: AC

## 2024-03-30 ENCOUNTER — Encounter (INDEPENDENT_AMBULATORY_CARE_PROVIDER_SITE_OTHER): Payer: Self-pay | Admitting: Nurse Practitioner

## 2024-03-30 NOTE — Progress Notes (Signed)
 Subjective:    Patient ID: [name], male    DOB: 1941-06-12, 83 y.o.   MRN: [id number] Chief Complaint  Patient presents with   Follow-up    6 week follow up RE LEA    The patient returns to the office for followup and review status post angiogram with intervention on 10/11/2023.   Procedure:  Procedure(s) Performed:             1.  Introduction catheter into right lower extremity 3rd order catheter placement               2.    Contrast injection right lower extremity for distal runoff             3.  Percutaneous transluminal angioplasty and stent placement right popliteal arteries to 5 mm             4.  Percutaneous transluminal angioplasty and stent placement tibioperoneal trunk and peroneal to 4mm             5.  Star close closure left common femoral arteriotomy   The patient notes improvement in the lower extremity symptoms. No interval shortening of the patient's claudication distance or rest pain symptoms. No new ulcers or wounds have occurred since the last visit.  His wounds have completely healed at this time.  However his right lower extremity is swollen and does have evidence of cellulitis.  There have been no significant changes to the patient's overall health care.  No documented history of amaurosis fugax or recent TIA symptoms. There are no recent neurological changes noted. No documented history of DVT, PE or superficial thrombophlebitis. The patient denies recent episodes of angina or shortness of breath.   Today noninvasive studies waveforms down to the level of the tibial vessels.  There are transitions to monophasic waveforms.  There are elevated velocities at the TP trunk and the right lower extremity.    Review of Systems  Cardiovascular:  Positive for leg swelling.  Neurological:  Positive for weakness.  All other systems reviewed and are negative.      Objective:   Physical Exam Vitals reviewed.  HENT:     Head: Normocephalic.   Cardiovascular:     Rate and Rhythm: Normal rate.  Pulmonary:     Effort: Pulmonary effort is normal.  Musculoskeletal:     Right lower leg: Edema present.  Skin:    General: Skin is warm and dry.  Neurological:     Mental Status: He is alert and oriented to person, place, and time.  Psychiatric:        Mood and Affect: Mood normal.        Behavior: Behavior normal.        Thought Content: Thought content normal.        Judgment: Judgment normal.     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   a.) TTE 08/27/2022: Ao sinus 4.1, asc Ao 3.7, ST junction 3.3   Atherosclerotic PVD with intermittent claudication (HCC)    a.) s/p vascular intervention 03/26/2018:  post tibial and peroneal crosser athrectomy, PTA LEFT post tibial, SFA, and popliteal arteries; b.) s/p PTA 03/20/2023: RIGHT SFA and popliteal arteries  Bilateral carotid artery disease (HCC) 08/22/2022   a.) carotid doppler 08/22/2022: <50 LICA, 50-69% RICA   Blurry vision, bilateral    CAD (coronary artery disease) 09/21/2022   a.) cCTA 09/21/2022: 591 (57 %ile for age/sex/race matched control)   Chest pain    Chronic heart failure with preserved ejection fraction (HFpEF) (HCC)    a.) TTE 08/27/2022: EF >55%, aym LVH, mild LAE, triv AR/PR, mild MR/TR. mild AS, Ao root dilatation, G1DD   CKD (chronic kidney disease), stage III (HCC)    Gangrene of toe of right foot (HCC)    a.) RIGHT second toe   Heart murmur    History of bilateral cataract extraction 2020   HLD (hyperlipidemia)    Hypertension    Incomplete right bundle branch block (RBBB)    Long term current use of antithrombotics/antiplatelets    a.) clopidogrel    Neuropathy    Palpitations    T2DM (type 2 diabetes mellitus)  (HCC)    Vertigo    Wears dentures    Full upper and lower (loose)    Social History   Socioeconomic History   Marital status: Married    Spouse name: Not on file   Number of children: Not on file   Years of education: Not on file   Highest education level: Not on file  Occupational History   Not on file  Tobacco Use   Smoking status: Former    Current packs/day: 0.00    Types: Cigarettes    Quit date: 1985    Years since quitting: 40.5   Smokeless tobacco: Current    Types: Chew   Tobacco comments:    Sometime I chew tobacco and sometime I don't.  DJM 09/02/2024  Vaping Use   Vaping status: Never Used  Substance and Sexual Activity   Alcohol use: Yes    Comment: once a month or once every 2-3 mos   Drug use: Never   Sexual activity: Never  Other Topics Concern   Not on file  Social History Narrative   Not on file   Social Drivers of Health   Financial Resource Strain: Low Risk  (03/11/2024)   Received from [facility]   Overall Financial Resource Strain (CARDIA)    Difficulty of Paying Living Expenses: Not hard at all  Food Insecurity: No Food Insecurity (03/11/2024)   Received from [facility]   Hunger Vital Sign    Within the past 12 months, you worried that your food would run out before you got the money to buy more.: Never true    Within the past 12 months, the food you bought just didn't last and you didn't have money to get more.: Never true  Transportation Needs: No Transportation Needs (03/11/2024)   Received from [facility] - Transportation    In the past 12 months, has lack of transportation kept you from medical appointments or from getting medications?: No    Lack of Transportation (Non-Medical): No  Physical Activity: Not on file  Stress: Not on file  Social Connections: Not on file  Intimate Partner Violence: Not At Risk (05/03/2023)   Humiliation, Afraid, Rape, and Kick  questionnaire    Fear of Current or Ex-Partner: No    Emotionally Abused: No    Physically Abused: No    Sexually Abused: No    Past Surgical History:  Procedure Laterality Date   AMPUTATION TOE Left 03/28/2018   Procedure: AMPUTATION  TOE/ PARTIAL RAY RESECTION-LEFT 2ND AND 3RD;  Surgeon: Neill Boas, DPM;  Location: [facility];  Service: Podiatry;  Laterality: Left;   AMPUTATION TOE Right 04/16/2023   Procedure: AMPUTATION TOE;  Surgeon: Janit Thresa HERO, DPM;  Location: [facility];  Service: Orthopedics/Podiatry;  Laterality: Right;   CATARACT EXTRACTION W/PHACO Left 02/10/2019   Procedure: CATARACT EXTRACTION PHACO AND INTRAOCULAR LENS PLACEMENT (IOC)  LEFT DIABETIC;  Surgeon: Myrna Adine Anes, MD;  Location: [facility];  Service: Ophthalmology;  Laterality: Left;  Diabetic - oral meds   CATARACT EXTRACTION W/PHACO Right 04/20/2019   Procedure: CATARACT EXTRACTION PHACO AND INTRAOCULAR LENS PLACEMENT (IOC)  RIGHT DIABETIC;  Surgeon: Myrna Adine Anes, MD;  Location: [facility];  Service: Ophthalmology;  Laterality: Right;   FEMORAL-TIBIAL BYPASS GRAFT Right 04/11/2023   Procedure: BYPASS GRAFT FEMORAL-TIBIAL ARTERY ( POPLITEAL TO PERONEAL);  Surgeon: Jama Cordella MATSU, MD;  Location: [facility];  Service: Vascular;  Laterality: Right;   HERNIA REPAIR     LOWER EXTREMITY ANGIOGRAPHY Left 03/26/2018   Procedure: Lower Extremity Angiography;  Surgeon: Jama Cordella MATSU, MD;  Location: [facility];  Service: Cardiovascular;  Laterality: Left;   LOWER EXTREMITY ANGIOGRAPHY Right 03/20/2023   Procedure: Lower Extremity Angiography;  Surgeon: Jama Cordella MATSU, MD;  Location: [facility];  Service: Cardiovascular;  Laterality: Right;   LOWER EXTREMITY ANGIOGRAPHY Right 10/11/2023   Procedure: Lower Extremity Angiography;  Surgeon: Jama Cordella MATSU, MD;  Location: [facility];  Service: Cardiovascular;  Laterality: Right;   LOWER EXTREMITY INTERVENTION  Right 05/03/2023   Procedure: LOWER EXTREMITY INTERVENTION;  Surgeon: Jama Cordella MATSU, MD;  Location: [facility];  Service: Cardiovascular;  Laterality: Right;  With Staple removal   Percutaneous transluminal angioplasty right superficial femoral artery      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   Procedure: TRANSMETATARSAL AMPUTATION;  Surgeon: Lilli Cough, DPM;  Location: [facility];  Service: Podiatry;  Laterality: Left;    Family History  Problem Relation Age of Onset   Diabetes Mother    Cancer Father    Hypertension Maternal Grandfather    Heart attack Maternal Grandfather     Allergies  Allergen Reactions   Influenza Virus Vaccine Other (See Comments) and Nausea And Vomiting    Fever, chills, vomiting.        Latest Ref Rng & Units 05/04/2023    6:17 AM 05/03/2023    6:13 AM 05/02/2023    2:58 PM  CBC  WBC 4.0 - 10.5 K/uL 8.1  11.7  12.2   Hemoglobin 13.0 - 17.0 g/dL 8.2  8.4  89.9   Hematocrit 39.0 - 52.0 % 25.5  24.9  31.2   Platelets 150 - 400 K/uL 176  180  203       CMP     Component Value Date/Time   NA 135 05/04/2023 0617   K 4.2 05/04/2023 0617   CL 107 05/04/2023 0617   CO2 21 (L) 05/04/2023 0617   GLUCOSE 96 05/04/2023 0617   BUN 33 (H) 10/11/2023 1025   CREATININE 1.03 10/11/2023 1025   CALCIUM  8.2 (L) 05/04/2023 0617   PROT 7.5 05/02/2023 1458   ALBUMIN  3.7 05/02/2023 1458   AST 24 05/02/2023 1458   ALT 31 05/02/2023 1458   ALKPHOS 61 05/02/2023 1458   BILITOT 0.8 05/02/2023 1458   GFRNONAA >60 10/11/2023 1025     VAS US  ABI WITH/WO TBI Result Date: 10/07/2023  LOWER EXTREMITY DOPPLER STUDY Patient  Name:  [name]  Date of Exam:   10/07/2023 Medical Rec #: [id number]         Accession #:    [id number] Date of Birth: [birth year]         Patient Gender: M Patient Age:   31 years Exam Location:  [facility] Procedure:      VAS US  ABI WITH/WO TBI Referring Phys: Cordella Shawl  --------------------------------------------------------------------------------  Indications: Peripheral artery disease. Other Factors: Left 2nd & 3rd toe amputation on 03/28/18.  Vascular Interventions: 03/26/18: Left SFA, popliteal, posterior tibial &                         peroneal artery PTAs                          04/11/2023: Right below knee Popliteal Artery to                         Peroneal Artery Bypass with Reverse Saphenous Vein                         Graft. Comparison Study: 05/10/2023 Performing Technologist: Leafy Gibes RVS  Examination Guidelines: A complete evaluation includes at minimum, Doppler waveform signals and systolic blood pressure reading at the level of bilateral brachial, anterior tibial, and posterior tibial arteries, when vessel segments are accessible. Bilateral testing is considered an integral part of a complete examination. Photoelectric Plethysmograph (PPG) waveforms and toe systolic pressure readings are included as required and additional duplex testing as needed. Limited examinations for reoccurring indications may be performed as noted.  ABI Findings: +---------+------------------+-----+----------+--------+ Right    Rt Pressure (mmHg)IndexWaveform  Comment  +---------+------------------+-----+----------+--------+ Brachial 143                                       +---------+------------------+-----+----------+--------+ ATA      78                0.55 monophasic         +---------+------------------+-----+----------+--------+ PTA      70                0.49 monophasic         +---------+------------------+-----+----------+--------+ Great Toe70                0.49 Abnormal           +---------+------------------+-----+----------+--------+ +---------+------------------+-----+-------------------+--------------+ Left     Lt Pressure (mmHg)IndexWaveform           Comment         +---------+------------------+-----+-------------------+--------------+ Brachial 142                                                      +---------+------------------+-----+-------------------+--------------+ ATA      64                0.45 dampened monophasic               +---------+------------------+-----+-------------------+--------------+ PTA      119               0.83 monophasic                        +---------+------------------+-----+-------------------+--------------+  Great Toe                                          Toes Amputated +---------+------------------+-----+-------------------+--------------+ +-------+-----------+-----------+------------+------------+ ABI/TBIToday's ABIToday's TBIPrevious ABIPrevious TBI +-------+-----------+-----------+------------+------------+ Right  .55        .49        .61                      +-------+-----------+-----------+------------+------------+ Left   .83                   .67                      +-------+-----------+-----------+------------+------------+ Right ABIs appear decreased compared to prior study on 05/10/2023.  Summary: Right: Resting right ankle-brachial index indicates moderate right lower extremity arterial disease. The right toe-brachial index is abnormal. Left: Resting left ankle-brachial index indicates mild left lower extremity arterial disease. *See table(s) above for measurements and observations.  Electronically signed by Cordella Shawl MD on 10/07/2023 at 4:11:40 PM.    Final        Assessment & Plan:   1. Atherosclerosis of native arteries of the extremities with ulceration (HCC) (Primary) The patient has cellulitis in his right lower extremity.  He does have elevated velocities of the TP trunk and so I have a low threshold for intervention if he has issues with healing or has issues with pain on his return.  He is advised to continue with use of his medical grade compression.  Will also  place the patient on antibiotics today as well. 2. Mixed hyperlipidemia Continue statin as ordered and reviewed, no changes at this time  3. Type 2 diabetes mellitus with diabetic peripheral angiopathy and gangrene, without long-term current use of insulin  (HCC) Continue hypoglycemic medications as already ordered, these medications have been reviewed and there are no changes at this time.  Hgb A1C to be monitored as already arranged by primary service   Current Outpatient Medications on File Prior to Visit  Medication Sig Dispense Refill   acetaminophen  (TYLENOL ) 325 MG tablet Take 2 tablets (650 mg total) by mouth every 6 (six) hours as needed for mild pain or fever. 20 tablet 0   aspirin  EC 81 MG tablet Take 1 tablet (81 mg total) by mouth daily at 6 (six) AM. Swallow whole. 30 tablet 12   clopidogrel  (PLAVIX ) 75 MG tablet Take 75 mg by mouth daily.     levofloxacin (LEVAQUIN) 750 MG tablet Take 750 mg by mouth daily.     Multiple Vitamin (MULTIVITAMIN) tablet Take 1 tablet by mouth 3 (three) times a week.     rosuvastatin  (CRESTOR ) 10 MG tablet Take 1 tablet (10 mg total) by mouth daily. 30 tablet 11   No current facility-administered medications on file prior to visit.    There are no Patient Instructions on file for this visit. No follow-ups on file.   Sharyl Panchal E Symiah Nowotny, NP

## 2024-04-13 ENCOUNTER — Ambulatory Visit (INDEPENDENT_AMBULATORY_CARE_PROVIDER_SITE_OTHER): Admitting: Nurse Practitioner

## 2024-04-28 ENCOUNTER — Ambulatory Visit: Admitting: Podiatry

## 2024-04-28 ENCOUNTER — Encounter: Attending: Physician Assistant | Admitting: Physician Assistant

## 2024-04-28 ENCOUNTER — Ambulatory Visit (INDEPENDENT_AMBULATORY_CARE_PROVIDER_SITE_OTHER)

## 2024-04-28 ENCOUNTER — Encounter: Payer: Self-pay | Admitting: Podiatry

## 2024-04-28 VITALS — Ht 68.0 in | Wt 168.0 lb

## 2024-04-28 DIAGNOSIS — E1151 Type 2 diabetes mellitus with diabetic peripheral angiopathy without gangrene: Secondary | ICD-10-CM | POA: Insufficient documentation

## 2024-04-28 DIAGNOSIS — I2510 Atherosclerotic heart disease of native coronary artery without angina pectoris: Secondary | ICD-10-CM | POA: Diagnosis not present

## 2024-04-28 DIAGNOSIS — E1122 Type 2 diabetes mellitus with diabetic chronic kidney disease: Secondary | ICD-10-CM | POA: Insufficient documentation

## 2024-04-28 DIAGNOSIS — I129 Hypertensive chronic kidney disease with stage 1 through stage 4 chronic kidney disease, or unspecified chronic kidney disease: Secondary | ICD-10-CM | POA: Insufficient documentation

## 2024-04-28 DIAGNOSIS — E11621 Type 2 diabetes mellitus with foot ulcer: Secondary | ICD-10-CM | POA: Insufficient documentation

## 2024-04-28 DIAGNOSIS — N183 Chronic kidney disease, stage 3 unspecified: Secondary | ICD-10-CM | POA: Diagnosis not present

## 2024-04-28 DIAGNOSIS — R600 Localized edema: Secondary | ICD-10-CM

## 2024-04-28 DIAGNOSIS — I96 Gangrene, not elsewhere classified: Secondary | ICD-10-CM

## 2024-04-28 DIAGNOSIS — Z7901 Long term (current) use of anticoagulants: Secondary | ICD-10-CM | POA: Insufficient documentation

## 2024-04-28 NOTE — Progress Notes (Signed)
 Chief Complaint  Patient presents with   Foot Pain    Pt is here due to right foot, he has a knot on the bottom of foot, states it was not there this weekend, he states that it does not hurt, states concerns due to previous issues with the foot.    Subjective:  Patient presents today for evaluation of right extremity edema and redness to the leg.  Denies fever.  He feels well.  All wounds are healed.  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   a.) TTE 08/27/2022: Ao sinus 4.1, asc Ao 3.7, ST junction 3.3   Atherosclerotic PVD with intermittent claudication (HCC)    a.) s/p vascular intervention 03/26/2018:  post tibial and peroneal crosser athrectomy, PTA LEFT post tibial, SFA, and popliteal arteries; b.) s/p PTA 03/20/2023: RIGHT SFA and popliteal arteries   Bilateral carotid artery disease (HCC) 08/22/2022   a.) carotid doppler 08/22/2022: <50 LICA, 50-69% RICA   Blurry vision, bilateral    CAD (coronary artery disease) 09/21/2022   a.) cCTA 09/21/2022: 591 (57 %ile for age/sex/race matched control)   Chest pain    Chronic heart failure with preserved ejection fraction (HFpEF) (HCC)    a.) TTE 08/27/2022: EF >55%, aym LVH, mild LAE, triv AR/PR, mild MR/TR. mild AS, Ao root dilatation, G1DD   CKD (chronic kidney disease), stage III (HCC)    Gangrene of toe of right foot (HCC)    a.) RIGHT second toe   Heart murmur    History of bilateral cataract extraction 2020   HLD (hyperlipidemia)    Hypertension    Incomplete right bundle branch block (RBBB)    Long term current use of antithrombotics/antiplatelets    a.) clopidogrel    Neuropathy    Palpitations    T2DM (type 2 diabetes mellitus) (HCC)    Vertigo    Wears dentures    Full upper and lower (loose)    Past Surgical History:  Procedure  Laterality Date   AMPUTATION TOE Left 03/28/2018   Procedure: AMPUTATION TOE/ PARTIAL RAY RESECTION-LEFT 2ND AND 3RD;  Surgeon: Neill Boas, DPM;  Location: [facility];  Service: Podiatry;  Laterality: Left;   AMPUTATION TOE Right 04/16/2023   Procedure: AMPUTATION TOE;  Surgeon: Janit Thresa HERO, DPM;  Location: [facility];  Service: Orthopedics/Podiatry;  Laterality: Right;   CATARACT EXTRACTION W/PHACO Left 02/10/2019   Procedure: CATARACT EXTRACTION PHACO AND INTRAOCULAR LENS PLACEMENT (IOC)  LEFT DIABETIC;  Surgeon: Myrna Adine Anes, MD;  Location: [facility];  Service: Ophthalmology;  Laterality: Left;  Diabetic - oral meds   CATARACT EXTRACTION W/PHACO Right 04/20/2019   Procedure: CATARACT EXTRACTION PHACO AND INTRAOCULAR LENS PLACEMENT (IOC)  RIGHT DIABETIC;  Surgeon: Myrna Adine Anes, MD;  Location: [facility];  Service: Ophthalmology;  Laterality: Right;   FEMORAL-TIBIAL BYPASS GRAFT Right 04/11/2023   Procedure: BYPASS GRAFT FEMORAL-TIBIAL ARTERY ( POPLITEAL TO PERONEAL);  Surgeon: Jama Cordella MATSU, MD;  Location: [facility];  Service: Vascular;  Laterality: Right;   HERNIA REPAIR     LOWER EXTREMITY ANGIOGRAPHY Left 03/26/2018   Procedure: Lower Extremity Angiography;  Surgeon: Jama Cordella MATSU, MD;  Location: [facility];  Service: Cardiovascular;  Laterality: Left;   LOWER EXTREMITY ANGIOGRAPHY Right 03/20/2023   Procedure: Lower Extremity Angiography;  Surgeon: Jama,  Cordella MATSU, MD;  Location: [facility];  Service: Cardiovascular;  Laterality: Right;   LOWER EXTREMITY ANGIOGRAPHY Right 10/11/2023   Procedure: Lower Extremity Angiography;  Surgeon: Jama Cordella MATSU, MD;  Location: [facility];  Service: Cardiovascular;  Laterality: Right;   LOWER EXTREMITY INTERVENTION Right 05/03/2023   Procedure: LOWER EXTREMITY INTERVENTION;  Surgeon: Jama Cordella MATSU, MD;  Location: [facility];  Service: Cardiovascular;  Laterality:  Right;  With Staple removal   Percutaneous transluminal angioplasty right superficial femoral artery      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   Procedure: TRANSMETATARSAL AMPUTATION;  Surgeon: Lilli Cough, DPM;  Location: [facility];  Service: Podiatry;  Laterality: Left;    Allergies  Allergen Reactions   Influenza Virus Vaccine Other (See Comments) and Nausea And Vomiting    Fever, chills, vomiting.      Objective/Physical Exam Today there are no ulcers noted to the foot.  There is some slight maceration in between the digits but no open wounds.  Chronic edema noted right lower extremity.  Skin is warm to touch bilateral.  No erythema compared to the contralateral limb.  ABI Findings:  +--------+------------------+-----+----------+--------+  Right  Rt Pressure (mmHg)IndexWaveform  Comment   +--------+------------------+-----+----------+--------+  [id number]                                       +--------+------------------+-----+----------+--------+  PTA    84                0.47 monophasic          +--------+------------------+-----+----------+--------+  PERO   109               0.61 monophasic          +--------+------------------+-----+----------+--------+   +---------+------------------+-----+-------------------+--------+  Left    Lt Pressure (mmHg)IndexWaveform           Comment   +---------+------------------+-----+-------------------+--------+  Brachial 178                                                 +---------+------------------+-----+-------------------+--------+  ATA     67                0.38 dampened monophasic          +---------+------------------+-----+-------------------+--------+  PTA     120               0.67 monophasic                   +---------+------------------+-----+-------------------+--------+  PERO    93                0.52 monophasic                    +---------+------------------+-----+-------------------+--------+  Great Toe                                          1/2 Foot  +---------+------------------+-----+-------------------+--------+   +-------+-----------+-----------+------------+------------+  ABI/TBIToday's ABIToday's TBIPrevious ABIPrevious TBI  +-------+-----------+-----------+------------+------------+  Right .61                   1.14                      +-------+-----------+-----------+------------+------------+  Left  .67                   .91                       +-------+-----------+-----------+------------+------------+  Bilateral ABIs appear decreased compared to prior study on 11/03/2018.    Summary:  Right: Resting right ankle-brachial index indicates moderate right lower extremity arterial disease.  Left: Resting left ankle-brachial index indicates moderate left lower extremity arterial disease.   Radiographic Exam RT foot 04/28/2024:  Osseous erosion noted to the MTPs of the forefoot with osseous reconsolidation.  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 s/p amputation right second toe inpatient. DOS: 04/16/2023 2.  S/p popliteal-peroneal artery bypass graft.  DOS: 04/11/2023.  Dr. Jama, [facility] 3. 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  Edema right lower extremity 5.  History of osteomyelitis right foot  Plan of Care:  -Patient was evaluated.  X-rays reviewed in detail with the patient today and compared to prior x-rays -For now the foot appears to be somewhat stable.  We will simply observe -Stressed the importance of wearing compression hose daily -Return to clinic 6 months for follow-up x-ray to compare to today's x-rays or sooner if any concerning features arise  Thresa EMERSON Sar, DPM [facility]  Dr. Thresa EMERSON Sar, DPM    [address] [place], [place] [address]                Office [phone]  Fax [phone]     On but

## 2024-05-01 ENCOUNTER — Ambulatory Visit (INDEPENDENT_AMBULATORY_CARE_PROVIDER_SITE_OTHER): Admitting: Nurse Practitioner

## 2024-05-01 ENCOUNTER — Encounter (INDEPENDENT_AMBULATORY_CARE_PROVIDER_SITE_OTHER): Payer: Self-pay | Admitting: Nurse Practitioner

## 2024-05-01 VITALS — BP 148/77 | HR 63 | Ht 68.0 in | Wt 169.0 lb

## 2024-05-01 DIAGNOSIS — E1152 Type 2 diabetes mellitus with diabetic peripheral angiopathy with gangrene: Secondary | ICD-10-CM

## 2024-05-01 DIAGNOSIS — I7025 Atherosclerosis of native arteries of other extremities with ulceration: Secondary | ICD-10-CM | POA: Diagnosis not present

## 2024-05-01 DIAGNOSIS — E782 Mixed hyperlipidemia: Secondary | ICD-10-CM | POA: Diagnosis not present

## 2024-05-04 ENCOUNTER — Encounter (INDEPENDENT_AMBULATORY_CARE_PROVIDER_SITE_OTHER): Payer: Self-pay | Admitting: Nurse Practitioner

## 2024-05-04 NOTE — Progress Notes (Signed)
 Subjective:    Patient ID: [name], male    DOB: [birth year], 83 y.o.   MRN: [id number] Chief Complaint  Patient presents with   Follow-up    - fu 2-3 weeks leg check no studies see FB/GS     The patient returns to the office for followup and review status post angiogram with intervention on 10/11/2023.   Procedure:  Procedure(s) Performed:             1.  Introduction catheter into right lower extremity 3rd order catheter placement               2.    Contrast injection right lower extremity for distal runoff             3.  Percutaneous transluminal angioplasty and stent placement right popliteal arteries to 5 mm             4.  Percutaneous transluminal angioplasty and stent placement tibioperoneal trunk and peroneal to 4mm             5.  Star close closure left common femoral arteriotomy   The patient notes improvement in the lower extremity symptoms. No interval shortening of the patient's claudication distance. No new ulcers or wounds have occurred since the last visit.  His wounds have completely healed at this time.  He still continues to have swelling in his right lower extremity.  The previous cellulitis has resolved.  He was previously doing better with his swelling until a few days ago when he did not wear his compression stockings for several days.  He is a somewhat difficult historian but he is seemingly describing some mild rest pain symptoms but notes that these are not necessarily consistent.  There have been no significant changes to the patient's overall health care.  No documented history of amaurosis fugax or recent TIA symptoms. There are no recent neurological changes noted. No documented history of DVT, PE or superficial thrombophlebitis. The patient denies recent episodes of angina or shortness of breath.   Previous noninvasive studies waveforms down to the level of the tibial vessels.  There are transitions to monophasic waveforms.  There are  elevated velocities at the TP trunk and the right lower extremity.    Review of Systems  Cardiovascular:  Positive for leg swelling.  Neurological:  Positive for weakness.  All other systems reviewed and are negative.      Objective:   Physical Exam Vitals reviewed.  HENT:     Head: Normocephalic.  Cardiovascular:     Rate and Rhythm: Normal rate.  Pulmonary:     Effort: Pulmonary effort is normal.  Musculoskeletal:     Right lower leg: Edema present.  Skin:    General: Skin is warm and dry.  Neurological:     Mental Status: He is alert and oriented to person, place, and time.  Psychiatric:        Mood and Affect: Mood normal.        Behavior: Behavior normal.        Thought Content: Thought content normal.        Judgment: Judgment normal.     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   a.) TTE 08/27/2022: Ao sinus 4.1, asc Ao 3.7, ST junction 3.3   Atherosclerotic PVD with intermittent claudication (HCC)    a.) s/p vascular intervention 03/26/2018:  post tibial and peroneal crosser athrectomy, PTA LEFT post tibial, SFA, and popliteal arteries; b.) s/p PTA 03/20/2023: RIGHT SFA and popliteal arteries   Bilateral carotid artery disease (HCC) 08/22/2022   a.) carotid doppler 08/22/2022: <50 LICA, 50-69% RICA   Blurry vision, bilateral    CAD (coronary artery disease) 09/21/2022   a.) cCTA 09/21/2022: 591 (57 %ile for age/sex/race matched control)   Chest pain    Chronic heart failure with preserved ejection fraction (HFpEF) (HCC)    a.) TTE 08/27/2022: EF >55%, aym LVH, mild LAE, triv AR/PR, mild MR/TR. mild AS, Ao root dilatation, G1DD   CKD (chronic kidney disease), stage III (HCC)    Gangrene of toe of right foot (HCC)    a.) RIGHT second toe   Heart  murmur    History of bilateral cataract extraction 2020   HLD (hyperlipidemia)    Hypertension    Incomplete right bundle branch block (RBBB)    Long term current use of antithrombotics/antiplatelets    a.) clopidogrel    Neuropathy    Palpitations    T2DM (type 2 diabetes mellitus) (HCC)    Vertigo    Wears dentures    Full upper and lower (loose)    Social History   Socioeconomic History   Marital status: Married    Spouse name: Not on file   Number of children: Not on file   Years of education: Not on file   Highest education level: Not on file  Occupational History   Not on file  Tobacco Use   Smoking status: Former    Current packs/day: 0.00    Types: Cigarettes    Quit date: 1985    Years since quitting: 40.6   Smokeless tobacco: Current    Types: Chew   Tobacco comments:    Sometime I chew tobacco and sometime I don't.  DJM 09/02/2024  Vaping Use   Vaping status: Never Used  Substance and Sexual Activity   Alcohol use: Yes    Comment: once a month or once every 2-3 mos   Drug use: Never   Sexual activity: Never  Other Topics Concern   Not on file  Social History Narrative   Not on file   Social Drivers of Health   Financial Resource Strain: Low Risk  (03/11/2024)   Received from [facility]   Overall Financial Resource Strain (CARDIA)    Difficulty of Paying Living Expenses: Not hard at all  Food Insecurity: No Food Insecurity (03/11/2024)   Received from [facility]   Hunger Vital Sign    Within the past 12 months, you worried that your food would run out before you got the money to buy more.: Never true    Within the past 12 months, the food you bought just didn't last and you didn't have money to get more.: Never true  Transportation Needs: No Transportation Needs (03/11/2024)   Received from [facility] - Transportation    In the past 12 months, has lack of transportation kept you from  medical appointments or from getting medications?: No    Lack of Transportation (Non-Medical): No  Physical Activity: Not on file  Stress: Not on file  Social Connections: Not on file  Intimate Partner  Violence: Not At Risk (05/03/2023)   Humiliation, Afraid, Rape, and Kick questionnaire    Fear of Current or Ex-Partner: No    Emotionally Abused: No    Physically Abused: No    Sexually Abused: No    Past Surgical History:  Procedure Laterality Date   AMPUTATION TOE Left 03/28/2018   Procedure: AMPUTATION TOE/ PARTIAL RAY RESECTION-LEFT 2ND AND 3RD;  Surgeon: Neill Boas, DPM;  Location: [facility];  Service: Podiatry;  Laterality: Left;   AMPUTATION TOE Right 04/16/2023   Procedure: AMPUTATION TOE;  Surgeon: Janit Thresa HERO, DPM;  Location: [facility];  Service: Orthopedics/Podiatry;  Laterality: Right;   CATARACT EXTRACTION W/PHACO Left 02/10/2019   Procedure: CATARACT EXTRACTION PHACO AND INTRAOCULAR LENS PLACEMENT (IOC)  LEFT DIABETIC;  Surgeon: Myrna Adine Anes, MD;  Location: [facility];  Service: Ophthalmology;  Laterality: Left;  Diabetic - oral meds   CATARACT EXTRACTION W/PHACO Right 04/20/2019   Procedure: CATARACT EXTRACTION PHACO AND INTRAOCULAR LENS PLACEMENT (IOC)  RIGHT DIABETIC;  Surgeon: Myrna Adine Anes, MD;  Location: [facility];  Service: Ophthalmology;  Laterality: Right;   FEMORAL-TIBIAL BYPASS GRAFT Right 04/11/2023   Procedure: BYPASS GRAFT FEMORAL-TIBIAL ARTERY ( POPLITEAL TO PERONEAL);  Surgeon: Jama Cordella MATSU, MD;  Location: [facility];  Service: Vascular;  Laterality: Right;   HERNIA REPAIR     LOWER EXTREMITY ANGIOGRAPHY Left 03/26/2018   Procedure: Lower Extremity Angiography;  Surgeon: Jama Cordella MATSU, MD;  Location: [facility];  Service: Cardiovascular;  Laterality: Left;   LOWER EXTREMITY ANGIOGRAPHY Right 03/20/2023   Procedure: Lower Extremity Angiography;  Surgeon: Jama Cordella MATSU, MD;  Location: [facility];   Service: Cardiovascular;  Laterality: Right;   LOWER EXTREMITY ANGIOGRAPHY Right 10/11/2023   Procedure: Lower Extremity Angiography;  Surgeon: Jama Cordella MATSU, MD;  Location: [facility];  Service: Cardiovascular;  Laterality: Right;   LOWER EXTREMITY INTERVENTION Right 05/03/2023   Procedure: LOWER EXTREMITY INTERVENTION;  Surgeon: Jama Cordella MATSU, MD;  Location: [facility];  Service: Cardiovascular;  Laterality: Right;  With Staple removal   Percutaneous transluminal angioplasty right superficial femoral artery      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   Procedure: TRANSMETATARSAL AMPUTATION;  Surgeon: Lilli Cough, DPM;  Location: [facility];  Service: Podiatry;  Laterality: Left;    Family History  Problem Relation Age of Onset   Diabetes Mother    Cancer Father    Hypertension Maternal Grandfather    Heart attack Maternal Grandfather     Allergies  Allergen Reactions   Influenza Virus Vaccine Other (See Comments) and Nausea And Vomiting    Fever, chills, vomiting.        Latest Ref Rng & Units 05/04/2023    6:17 AM 05/03/2023    6:13 AM 05/02/2023    2:58 PM  CBC  WBC 4.0 - 10.5 K/uL 8.1  11.7  12.2   Hemoglobin 13.0 - 17.0 g/dL 8.2  8.4  89.9   Hematocrit 39.0 - 52.0 % 25.5  24.9  31.2   Platelets 150 - 400 K/uL 176  180  203       CMP     Component Value Date/Time   NA 135 05/04/2023 0617   K 4.2 05/04/2023 0617   CL 107 05/04/2023 0617   CO2 21 (L) 05/04/2023 0617   GLUCOSE 96 05/04/2023 0617   BUN 33 (H) 10/11/2023 1025   CREATININE 1.03 10/11/2023 1025   CALCIUM  8.2 (L) 05/04/2023 0617  PROT 7.5 05/02/2023 1458   ALBUMIN  3.7 05/02/2023 1458   AST 24 05/02/2023 1458   ALT 31 05/02/2023 1458   ALKPHOS 61 05/02/2023 1458   BILITOT 0.8 05/02/2023 1458   GFRNONAA >60 10/11/2023 1025     VAS US  ABI WITH/WO TBI Result Date: 10/07/2023  LOWER EXTREMITY DOPPLER STUDY Patient Name:  JIRAIYA MCEWAN  Date of Exam:   10/07/2023  Medical Rec #: [id number]         Accession #:    [id number] Date of Birth: Feb 11, 1941         Patient Gender: M Patient Age:   28 years Exam Location:  [facility] Procedure:      VAS US  ABI WITH/WO TBI Referring Phys: Cordella Shawl --------------------------------------------------------------------------------  Indications: Peripheral artery disease. Other Factors: Left 2nd & 3rd toe amputation on 03/28/18.  Vascular Interventions: 03/26/18: Left SFA, popliteal, posterior tibial &                         peroneal artery PTAs                          04/11/2023: Right below knee Popliteal Artery to                         Peroneal Artery Bypass with Reverse Saphenous Vein                         Graft. Comparison Study: 05/10/2023 Performing Technologist: Leafy Gibes RVS  Examination Guidelines: A complete evaluation includes at minimum, Doppler waveform signals and systolic blood pressure reading at the level of bilateral brachial, anterior tibial, and posterior tibial arteries, when vessel segments are accessible. Bilateral testing is considered an integral part of a complete examination. Photoelectric Plethysmograph (PPG) waveforms and toe systolic pressure readings are included as required and additional duplex testing as needed. Limited examinations for reoccurring indications may be performed as noted.  ABI Findings: +---------+------------------+-----+----------+--------+ Right    Rt Pressure (mmHg)IndexWaveform  Comment  +---------+------------------+-----+----------+--------+ Brachial 143                                       +---------+------------------+-----+----------+--------+ ATA      78                0.55 monophasic         +---------+------------------+-----+----------+--------+ PTA      70                0.49 monophasic         +---------+------------------+-----+----------+--------+ Great Toe70                0.49 Abnormal            +---------+------------------+-----+----------+--------+ +---------+------------------+-----+-------------------+--------------+ Left     Lt Pressure (mmHg)IndexWaveform           Comment        +---------+------------------+-----+-------------------+--------------+ Brachial 142                                                      +---------+------------------+-----+-------------------+--------------+ ATA      64  0.45 dampened monophasic               +---------+------------------+-----+-------------------+--------------+ PTA      119               0.83 monophasic                        +---------+------------------+-----+-------------------+--------------+ Great Toe                                          Toes Amputated +---------+------------------+-----+-------------------+--------------+ +-------+-----------+-----------+------------+------------+ ABI/TBIToday's ABIToday's TBIPrevious ABIPrevious TBI +-------+-----------+-----------+------------+------------+ Right  .55        .49        .61                      +-------+-----------+-----------+------------+------------+ Left   .83                   .67                      +-------+-----------+-----------+------------+------------+ Right ABIs appear decreased compared to prior study on 05/10/2023.  Summary: Right: Resting right ankle-brachial index indicates moderate right lower extremity arterial disease. The right toe-brachial index is abnormal. Left: Resting left ankle-brachial index indicates mild left lower extremity arterial disease. *See table(s) above for measurements and observations.  Electronically signed by Cordella Shawl MD on 10/07/2023 at 4:11:40 PM.    Final        Assessment & Plan:   1. Atherosclerosis of native arteries of the extremities with ulceration (HCC) (Primary) I had a long discussion with the patient and family regarding his studies.  I have a suspicion that the  pain he is describing is related to rest pain.  He currently does not have any open wounds or ulcerations, but healing his previous wounds was exceptionally difficult.  I have discussed and recommended an angiogram to the patient but he is apprehensive to undergo any invasive interventions at this time.  Will continue to monitor him very closely given the elevated tibial velocities in his previous studies.  He will return in 6 to 8 weeks with noninvasive studies.  They are advised if he develops a new wound or ulceration before then to contact us  to be seen sooner.  2. Mixed hyperlipidemia Continue statin as ordered and reviewed, no changes at this time  3. Type 2 diabetes mellitus with diabetic peripheral angiopathy and gangrene, without long-term current use of insulin  (HCC) Continue hypoglycemic medications as already ordered, these medications have been reviewed and there are no changes at this time.  Hgb A1C to be monitored as already arranged by primary service   Current Outpatient Medications on File Prior to Visit  Medication Sig Dispense Refill   acetaminophen  (TYLENOL ) 325 MG tablet Take 2 tablets (650 mg total) by mouth every 6 (six) hours as needed for mild pain or fever. 20 tablet 0   aspirin  EC 81 MG tablet Take 1 tablet (81 mg total) by mouth daily at 6 (six) AM. Swallow whole. 30 tablet 12   clopidogrel  (PLAVIX ) 75 MG tablet Take 75 mg by mouth daily.     furosemide  (LASIX ) 20 MG tablet Take 1 tablet (20 mg total) by mouth daily. 10 tablet 0   Multiple Vitamin (MULTIVITAMIN) tablet Take 1 tablet by mouth 3 (three) times a week.  rosuvastatin  (CRESTOR ) 10 MG tablet Take 1 tablet (10 mg total) by mouth daily. 30 tablet 11   levofloxacin (LEVAQUIN) 750 MG tablet Take 750 mg by mouth daily. (Patient not taking: Reported on 05/01/2024)     sulfamethoxazole -trimethoprim  (BACTRIM  DS) 800-160 MG tablet Take 1 tablet by mouth 2 (two) times daily. (Patient not taking: Reported on  05/01/2024) 20 tablet 0   No current facility-administered medications on file prior to visit.    There are no Patient Instructions on file for this visit. No follow-ups on file.   Luken Shadowens E Haidynn Almendarez, NP

## 2024-06-24 ENCOUNTER — Other Ambulatory Visit (INDEPENDENT_AMBULATORY_CARE_PROVIDER_SITE_OTHER): Payer: Self-pay | Admitting: Nurse Practitioner

## 2024-06-24 DIAGNOSIS — I739 Peripheral vascular disease, unspecified: Secondary | ICD-10-CM

## 2024-06-24 NOTE — Progress Notes (Signed)
 MRN : [id number]  [name] is a 83 y.o. ([birth year]) male who presents with chief complaint of check circulation.  History of Present Illness:   The patient returns to the office for followup regarding atherosclerotic changes of the lower extremities and review of the noninvasive studies.   There have been no interval changes in lower extremity symptoms. No interval shortening of the patient's claudication distance or development of rest pain symptoms. No new ulcers or wounds have occurred since the last visit.  There have been no significant changes to the patient's overall health care.  The patient denies amaurosis fugax or recent TIA symptoms. There are no documented recent neurological changes noted. There is no history of DVT, PE or superficial thrombophlebitis. The patient denies recent episodes of angina or shortness of breath.   ABI Rt=0.29 and Lt=0.57  (previous ABI's Rt=0.00 and Lt=0.67)   No outpatient medications have been marked as taking for the 06/25/24 encounter (Appointment) with Charles, Cordella MATSU, MD.    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   a.) TTE 08/27/2022: Ao sinus 4.1, asc Ao 3.7, ST junction 3.3   Atherosclerotic PVD with intermittent claudication    a.) s/p vascular intervention 03/26/2018:  post tibial and peroneal crosser athrectomy, PTA LEFT post tibial, SFA, and popliteal arteries; b.) s/p PTA 03/20/2023: RIGHT SFA and popliteal arteries   Bilateral carotid artery disease 08/22/2022   a.) carotid doppler 08/22/2022: <50 LICA, 50-69% RICA   Blurry vision, bilateral    CAD (coronary artery disease) 09/21/2022   a.) cCTA 09/21/2022: 591 (57 %ile for age/sex/race matched control)   Chest pain    Chronic heart failure with preserved ejection fraction  (HFpEF) (HCC)    a.) TTE 08/27/2022: EF >55%, aym LVH, mild LAE, triv AR/PR, mild MR/TR. mild AS, Ao root dilatation, G1DD   CKD (chronic kidney disease), stage III (HCC)    Gangrene of toe of right foot (HCC)    a.) RIGHT second toe   Heart murmur    History of bilateral cataract extraction 2020   HLD (hyperlipidemia)    Hypertension    Incomplete right bundle branch block (RBBB)    Long term current use of antithrombotics/antiplatelets    a.) clopidogrel    Neuropathy    Palpitations    T2DM (type 2 diabetes mellitus) (HCC)    Vertigo    Wears dentures    Full upper and lower (loose)    Past Surgical History:  Procedure Laterality Date   AMPUTATION TOE Left 03/28/2018   Procedure: AMPUTATION TOE/ PARTIAL RAY RESECTION-LEFT 2ND AND 3RD;  Surgeon: Neill Boas, DPM;  Location: [facility];  Service: Podiatry;  Laterality: Left;   AMPUTATION TOE Right 04/16/2023   Procedure: AMPUTATION TOE;  Surgeon: Janit Thresa HERO, DPM;  Location: [facility];  Service: Orthopedics/Podiatry;  Laterality: Right;   CATARACT EXTRACTION W/PHACO Left 02/10/2019   Procedure: CATARACT EXTRACTION PHACO AND INTRAOCULAR LENS PLACEMENT (IOC)  LEFT DIABETIC;  Surgeon: Myrna,  Adine Anes, MD;  Location: [facility];  Service: Ophthalmology;  Laterality: Left;  Diabetic - oral meds   CATARACT EXTRACTION W/PHACO Right 04/20/2019   Procedure: CATARACT EXTRACTION PHACO AND INTRAOCULAR LENS PLACEMENT (IOC)  RIGHT DIABETIC;  Surgeon: Myrna Adine Anes, MD;  Location: [facility];  Service: Ophthalmology;  Laterality: Right;   FEMORAL-TIBIAL BYPASS GRAFT Right 04/11/2023   Procedure: BYPASS GRAFT FEMORAL-TIBIAL ARTERY ( POPLITEAL TO PERONEAL);  Surgeon: Charles Cordella MATSU, MD;  Location: [facility];  Service: Vascular;  Laterality: Right;   HERNIA REPAIR     LOWER EXTREMITY ANGIOGRAPHY Left 03/26/2018   Procedure: Lower Extremity Angiography;  Surgeon: Charles Cordella MATSU, MD;  Location: [facility];   Service: Cardiovascular;  Laterality: Left;   LOWER EXTREMITY ANGIOGRAPHY Right 03/20/2023   Procedure: Lower Extremity Angiography;  Surgeon: Charles Cordella MATSU, MD;  Location: [facility];  Service: Cardiovascular;  Laterality: Right;   LOWER EXTREMITY ANGIOGRAPHY Right 10/11/2023   Procedure: Lower Extremity Angiography;  Surgeon: Charles Cordella MATSU, MD;  Location: [facility];  Service: Cardiovascular;  Laterality: Right;   LOWER EXTREMITY INTERVENTION Right 05/03/2023   Procedure: LOWER EXTREMITY INTERVENTION;  Surgeon: Charles Cordella MATSU, MD;  Location: [facility];  Service: Cardiovascular;  Laterality: Right;  With Staple removal   Percutaneous transluminal angioplasty right superficial femoral artery      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  DJM 09/02/2024  Vaping Use   Vaping status: Never Used  Substance Use Topics   Alcohol use: Yes    Comment: once a month or once every 2-3 mos   Drug use: Never    Family History Family History  Problem Relation Age of Onset   Diabetes Mother    Cancer Father    Hypertension Maternal Grandfather    Heart attack Maternal Grandfather     Allergies  Allergen Reactions   Influenza Virus Vaccine Other (See Comments) and Nausea And Vomiting    Fever, chills, vomiting.      REVIEW OF SYSTEMS (Negative unless checked)  Constitutional: [] Weight loss  [] Fever  [] Chills Cardiac: [] Chest pain   [] Chest pressure   [] Palpitations   [] Shortness of breath when laying flat   [] Shortness of breath  with exertion. Vascular:  [x] Pain in legs with walking   [] Pain in legs at rest  [] History of DVT   [] Phlebitis   [] Swelling in legs   [] Varicose veins   [] Non-healing ulcers Pulmonary:   [] Uses home oxygen   [] Productive cough   [] Hemoptysis   [] Wheeze  [] COPD   [] Asthma Neurologic:  [] Dizziness   [] Seizures   [] History of stroke   [] History of TIA  [] Aphasia   [] Vissual changes   [] Weakness or numbness in arm   [] Weakness or numbness in leg Musculoskeletal:   [] Joint swelling   [] Joint pain   [] Low back pain Hematologic:  [] Easy bruising  [] Easy bleeding   [] Hypercoagulable state   [] Anemic Gastrointestinal:  [] Diarrhea   [] Vomiting  [] Gastroesophageal  reflux/heartburn   [] Difficulty swallowing. Genitourinary:  [] Chronic kidney disease   [] Difficult urination  [] Frequent urination   [] Blood in urine Skin:  [] Rashes   [] Ulcers  Psychological:  [] History of anxiety   []  History of major depression.  Physical Examination  There were no vitals filed for this visit. There is no height or weight on file to calculate BMI. Gen: WD/WN, NAD Head: [place]/AT, No temporalis wasting.  Ear/Nose/Throat: Hearing grossly intact, nares w/o erythema or drainage Eyes: PER, EOMI, sclera nonicteric.  Neck: Supple, no masses.  No bruit or JVD.  Pulmonary:  Good air movement, no audible wheezing, no use of accessory muscles.  Cardiac: RRR, normal S1, S2, no Murmurs. Vascular:  mild trophic changes, no open wounds Vessel Right Left  Radial Palpable Palpable  PT Not Palpable Not Palpable  DP Not Palpable Not Palpable  Gastrointestinal: soft, non-distended. No guarding/no peritoneal signs.  Musculoskeletal: M/S 5/5 throughout.  No visible deformity.  Neurologic: CN 2-12 intact. Pain and light touch intact in extremities.  Symmetrical.  Speech is fluent. Motor exam as listed above. Psychiatric: Judgment intact, Mood & affect appropriate for pt's clinical situation. Dermatologic: No rashes or ulcers noted.  No  changes consistent with cellulitis.   CBC Lab Results  Component Value Date   WBC 8.1 05/04/2023   HGB 8.2 (L) 05/04/2023   HCT 25.5 (L) 05/04/2023   MCV 92.7 05/04/2023   PLT 176 05/04/2023    BMET    Component Value Date/Time   NA 135 05/04/2023 0617   K 4.2 05/04/2023 0617   CL 107 05/04/2023 0617   CO2 21 (L) 05/04/2023 0617   GLUCOSE 96 05/04/2023 0617   BUN 33 (H) 10/11/2023 1025   CREATININE 1.03 10/11/2023 1025   CALCIUM  8.2 (L) 05/04/2023 0617   GFRNONAA >60 10/11/2023 1025   GFRAA >60 07/30/2018 1045   CrCl cannot be calculated (Patient's most recent lab result is older than the maximum 21 days allowed.).  COAG Lab Results  Component Value Date   INR 1.2 03/19/2023   INR 1.25 07/18/2018    Radiology No results found.   Assessment/Plan 1. Atherosclerosis of native artery of both lower extremities with intermittent claudication (Primary)  Recommend:  The patient has evidence of atherosclerosis of the lower extremities with claudication.  The patient does not voice lifestyle limiting changes at this point in time.  Noninvasive studies do not suggest clinically significant change.  No invasive studies, angiography or surgery at this time The patient should continue walking and begin a more formal exercise program.  The patient should continue antiplatelet therapy and aggressive treatment of the lipid abnormalities  No changes in the patient's medications at this time  Continued surveillance is indicated as atherosclerosis is likely to progress with time.    The patient will continue follow up with noninvasive studies as ordered.  - VAS US  ABI WITH/WO TBI; Future  2. Coronary artery disease involving native coronary artery of native heart with angina pectoris Continue cardiac and antihypertensive medications as already ordered and reviewed, no changes at this time.  Continue statin as ordered and reviewed, no changes at this time  Nitrates PRN for  chest pain  3. Essential hypertension Continue antihypertensive medications as already ordered, these medications have been reviewed and there are no changes at this time.  4. Type 2 diabetes mellitus with diabetic peripheral angiopathy and gangrene, without long-term current use of insulin  (HCC) Continue hypoglycemic medications as already ordered, these medications have been reviewed  and there are no changes at this time.  Hgb A1C to be monitored as already arranged by primary service  5. Mixed hyperlipidemia Continue statin as ordered and reviewed, no changes at this time    Cordella Shawl, MD  06/24/2024 8:26 AM

## 2024-06-25 ENCOUNTER — Ambulatory Visit (INDEPENDENT_AMBULATORY_CARE_PROVIDER_SITE_OTHER)

## 2024-06-25 ENCOUNTER — Encounter (INDEPENDENT_AMBULATORY_CARE_PROVIDER_SITE_OTHER): Payer: Self-pay | Admitting: Vascular Surgery

## 2024-06-25 ENCOUNTER — Ambulatory Visit (INDEPENDENT_AMBULATORY_CARE_PROVIDER_SITE_OTHER): Admitting: Vascular Surgery

## 2024-06-25 VITALS — BP 163/63 | HR 57 | Resp 18 | Ht 68.0 in | Wt 169.0 lb

## 2024-06-25 DIAGNOSIS — I70213 Atherosclerosis of native arteries of extremities with intermittent claudication, bilateral legs: Secondary | ICD-10-CM | POA: Diagnosis not present

## 2024-06-25 DIAGNOSIS — E1152 Type 2 diabetes mellitus with diabetic peripheral angiopathy with gangrene: Secondary | ICD-10-CM

## 2024-06-25 DIAGNOSIS — I25119 Atherosclerotic heart disease of native coronary artery with unspecified angina pectoris: Secondary | ICD-10-CM | POA: Diagnosis not present

## 2024-06-25 DIAGNOSIS — I10 Essential (primary) hypertension: Secondary | ICD-10-CM | POA: Diagnosis not present

## 2024-06-25 DIAGNOSIS — Z9889 Other specified postprocedural states: Secondary | ICD-10-CM

## 2024-06-25 DIAGNOSIS — I739 Peripheral vascular disease, unspecified: Secondary | ICD-10-CM

## 2024-06-25 DIAGNOSIS — E782 Mixed hyperlipidemia: Secondary | ICD-10-CM

## 2024-06-29 LAB — VAS US ABI WITH/WO TBI
Left ABI: 0.57
Right ABI: 0.29

## 2024-10-27 ENCOUNTER — Ambulatory Visit: Admitting: Podiatry

## 2024-12-28 ENCOUNTER — Ambulatory Visit (INDEPENDENT_AMBULATORY_CARE_PROVIDER_SITE_OTHER): Admitting: Vascular Surgery

## 2024-12-28 ENCOUNTER — Encounter (INDEPENDENT_AMBULATORY_CARE_PROVIDER_SITE_OTHER)
